# Patient Record
Sex: Female | Born: 1965 | Race: Black or African American | Hispanic: No | Marital: Married | Smoking: Never smoker
Health system: Southern US, Community
[De-identification: ages and names within clinical notes are randomized; demographics above are authoritative.]

## PROBLEM LIST (undated history)

## (undated) DIAGNOSIS — I2510 Atherosclerotic heart disease of native coronary artery without angina pectoris: Secondary | ICD-10-CM

## (undated) DIAGNOSIS — Z86018 Personal history of other benign neoplasm: Secondary | ICD-10-CM

## (undated) DIAGNOSIS — Z9889 Other specified postprocedural states: Secondary | ICD-10-CM

## (undated) DIAGNOSIS — Z87412 Personal history of vulvar dysplasia: Secondary | ICD-10-CM

## (undated) DIAGNOSIS — Z87411 Personal history of vaginal dysplasia: Secondary | ICD-10-CM

## (undated) DIAGNOSIS — E781 Pure hyperglyceridemia: Secondary | ICD-10-CM

## (undated) DIAGNOSIS — I10 Essential (primary) hypertension: Secondary | ICD-10-CM

## (undated) DIAGNOSIS — R112 Nausea with vomiting, unspecified: Secondary | ICD-10-CM

## (undated) DIAGNOSIS — Z973 Presence of spectacles and contact lenses: Secondary | ICD-10-CM

## (undated) DIAGNOSIS — D071 Carcinoma in situ of vulva: Secondary | ICD-10-CM

## (undated) DIAGNOSIS — Z8619 Personal history of other infectious and parasitic diseases: Secondary | ICD-10-CM

## (undated) DIAGNOSIS — Z21 Asymptomatic human immunodeficiency virus [HIV] infection status: Secondary | ICD-10-CM

## (undated) DIAGNOSIS — B20 Human immunodeficiency virus [HIV] disease: Secondary | ICD-10-CM

## (undated) HISTORY — DX: Asymptomatic human immunodeficiency virus (hiv) infection status: Z21

## (undated) HISTORY — DX: Human immunodeficiency virus (HIV) disease: B20

## (undated) HISTORY — PX: ABDOMINAL HYSTERECTOMY: SHX81

## (undated) HISTORY — DX: Essential (primary) hypertension: I10

---

## 1983-11-10 DIAGNOSIS — Z9079 Acquired absence of other genital organ(s): Secondary | ICD-10-CM | POA: Insufficient documentation

## 1998-05-23 ENCOUNTER — Encounter: Admission: RE | Admit: 1998-05-23 | Discharge: 1998-08-21 | Payer: Self-pay | Admitting: Internal Medicine

## 2000-09-13 ENCOUNTER — Other Ambulatory Visit: Admission: RE | Admit: 2000-09-13 | Discharge: 2000-09-13 | Payer: Self-pay | Admitting: *Deleted

## 2000-09-15 DIAGNOSIS — B20 Human immunodeficiency virus [HIV] disease: Secondary | ICD-10-CM

## 2000-09-23 ENCOUNTER — Encounter: Admission: RE | Admit: 2000-09-23 | Discharge: 2000-09-23 | Payer: Self-pay | Admitting: Infectious Diseases

## 2000-09-23 ENCOUNTER — Ambulatory Visit (HOSPITAL_COMMUNITY): Admission: RE | Admit: 2000-09-23 | Discharge: 2000-09-23 | Payer: Self-pay | Admitting: Infectious Diseases

## 2000-09-23 ENCOUNTER — Encounter (INDEPENDENT_AMBULATORY_CARE_PROVIDER_SITE_OTHER): Payer: Self-pay | Admitting: *Deleted

## 2000-09-23 LAB — CONVERTED CEMR LAB: CD4 T Cell Abs: 190

## 2000-10-13 ENCOUNTER — Encounter: Admission: RE | Admit: 2000-10-13 | Discharge: 2000-10-13 | Payer: Self-pay | Admitting: Infectious Diseases

## 2000-10-13 ENCOUNTER — Ambulatory Visit (HOSPITAL_COMMUNITY): Admission: RE | Admit: 2000-10-13 | Discharge: 2000-10-13 | Payer: Self-pay | Admitting: Infectious Diseases

## 2000-10-27 ENCOUNTER — Encounter: Admission: RE | Admit: 2000-10-27 | Discharge: 2000-10-27 | Payer: Self-pay | Admitting: Infectious Diseases

## 2000-11-24 ENCOUNTER — Encounter: Admission: RE | Admit: 2000-11-24 | Discharge: 2000-11-24 | Payer: Self-pay | Admitting: Infectious Diseases

## 2000-12-13 ENCOUNTER — Encounter: Admission: RE | Admit: 2000-12-13 | Discharge: 2000-12-13 | Payer: Self-pay | Admitting: Infectious Diseases

## 2000-12-22 ENCOUNTER — Encounter: Admission: RE | Admit: 2000-12-22 | Discharge: 2000-12-22 | Payer: Self-pay | Admitting: Infectious Diseases

## 2000-12-29 ENCOUNTER — Encounter: Admission: RE | Admit: 2000-12-29 | Discharge: 2000-12-29 | Payer: Self-pay | Admitting: Infectious Diseases

## 2000-12-30 ENCOUNTER — Ambulatory Visit (HOSPITAL_COMMUNITY): Admission: RE | Admit: 2000-12-30 | Discharge: 2000-12-30 | Payer: Self-pay | Admitting: Infectious Diseases

## 2000-12-30 ENCOUNTER — Encounter: Payer: Self-pay | Admitting: Infectious Diseases

## 2001-01-05 ENCOUNTER — Encounter: Admission: RE | Admit: 2001-01-05 | Discharge: 2001-01-05 | Payer: Self-pay | Admitting: Infectious Diseases

## 2001-01-05 ENCOUNTER — Ambulatory Visit (HOSPITAL_COMMUNITY): Admission: RE | Admit: 2001-01-05 | Discharge: 2001-01-05 | Payer: Self-pay | Admitting: Infectious Diseases

## 2001-02-02 ENCOUNTER — Encounter: Admission: RE | Admit: 2001-02-02 | Discharge: 2001-02-02 | Payer: Self-pay | Admitting: Infectious Diseases

## 2001-03-02 ENCOUNTER — Encounter: Admission: RE | Admit: 2001-03-02 | Discharge: 2001-03-02 | Payer: Self-pay | Admitting: Infectious Diseases

## 2001-04-27 ENCOUNTER — Encounter: Admission: RE | Admit: 2001-04-27 | Discharge: 2001-04-27 | Payer: Self-pay | Admitting: Infectious Diseases

## 2001-04-27 ENCOUNTER — Ambulatory Visit (HOSPITAL_COMMUNITY): Admission: RE | Admit: 2001-04-27 | Discharge: 2001-04-27 | Payer: Self-pay | Admitting: Infectious Diseases

## 2001-04-28 ENCOUNTER — Ambulatory Visit (HOSPITAL_BASED_OUTPATIENT_CLINIC_OR_DEPARTMENT_OTHER): Admission: RE | Admit: 2001-04-28 | Discharge: 2001-04-28 | Payer: Self-pay | Admitting: General Surgery

## 2001-04-28 HISTORY — PX: OTHER SURGICAL HISTORY: SHX169

## 2001-05-18 ENCOUNTER — Encounter: Admission: RE | Admit: 2001-05-18 | Discharge: 2001-05-18 | Payer: Self-pay | Admitting: Infectious Diseases

## 2001-06-08 ENCOUNTER — Encounter: Admission: RE | Admit: 2001-06-08 | Discharge: 2001-06-08 | Payer: Self-pay | Admitting: Infectious Diseases

## 2001-07-08 ENCOUNTER — Encounter: Admission: RE | Admit: 2001-07-08 | Discharge: 2001-07-08 | Payer: Self-pay | Admitting: Infectious Diseases

## 2001-09-07 ENCOUNTER — Encounter: Admission: RE | Admit: 2001-09-07 | Discharge: 2001-09-07 | Payer: Self-pay | Admitting: Infectious Diseases

## 2001-09-07 ENCOUNTER — Ambulatory Visit (HOSPITAL_COMMUNITY): Admission: RE | Admit: 2001-09-07 | Discharge: 2001-09-07 | Payer: Self-pay | Admitting: Infectious Diseases

## 2001-11-16 ENCOUNTER — Encounter: Admission: RE | Admit: 2001-11-16 | Discharge: 2001-11-16 | Payer: Self-pay | Admitting: Infectious Diseases

## 2001-11-16 ENCOUNTER — Ambulatory Visit (HOSPITAL_COMMUNITY): Admission: RE | Admit: 2001-11-16 | Discharge: 2001-11-16 | Payer: Self-pay | Admitting: Infectious Diseases

## 2002-03-22 ENCOUNTER — Encounter: Admission: RE | Admit: 2002-03-22 | Discharge: 2002-03-22 | Payer: Self-pay | Admitting: Infectious Diseases

## 2002-04-05 ENCOUNTER — Encounter: Admission: RE | Admit: 2002-04-05 | Discharge: 2002-04-05 | Payer: Self-pay | Admitting: Infectious Diseases

## 2002-04-05 ENCOUNTER — Ambulatory Visit (HOSPITAL_COMMUNITY): Admission: RE | Admit: 2002-04-05 | Discharge: 2002-04-05 | Payer: Self-pay | Admitting: Infectious Diseases

## 2002-10-11 ENCOUNTER — Encounter: Admission: RE | Admit: 2002-10-11 | Discharge: 2002-10-11 | Payer: Self-pay | Admitting: Infectious Diseases

## 2002-10-11 ENCOUNTER — Ambulatory Visit (HOSPITAL_COMMUNITY): Admission: RE | Admit: 2002-10-11 | Discharge: 2002-10-11 | Payer: Self-pay | Admitting: Infectious Diseases

## 2003-10-29 ENCOUNTER — Encounter: Admission: RE | Admit: 2003-10-29 | Discharge: 2003-10-29 | Payer: Self-pay | Admitting: Infectious Diseases

## 2003-10-29 ENCOUNTER — Ambulatory Visit (HOSPITAL_COMMUNITY): Admission: RE | Admit: 2003-10-29 | Discharge: 2003-10-29 | Payer: Self-pay | Admitting: Infectious Diseases

## 2003-10-29 ENCOUNTER — Encounter: Payer: Self-pay | Admitting: Infectious Diseases

## 2003-11-12 ENCOUNTER — Encounter: Admission: RE | Admit: 2003-11-12 | Discharge: 2003-11-12 | Payer: Self-pay | Admitting: Infectious Diseases

## 2004-01-16 ENCOUNTER — Encounter: Admission: RE | Admit: 2004-01-16 | Discharge: 2004-01-16 | Payer: Self-pay | Admitting: Infectious Diseases

## 2004-07-07 ENCOUNTER — Ambulatory Visit (HOSPITAL_COMMUNITY): Admission: RE | Admit: 2004-07-07 | Discharge: 2004-07-07 | Payer: Self-pay | Admitting: Infectious Diseases

## 2004-07-07 ENCOUNTER — Encounter: Admission: RE | Admit: 2004-07-07 | Discharge: 2004-07-07 | Payer: Self-pay | Admitting: Infectious Diseases

## 2004-09-15 ENCOUNTER — Ambulatory Visit: Payer: Self-pay | Admitting: Infectious Diseases

## 2004-09-29 ENCOUNTER — Ambulatory Visit: Payer: Self-pay | Admitting: Infectious Diseases

## 2004-09-29 ENCOUNTER — Ambulatory Visit (HOSPITAL_COMMUNITY): Admission: RE | Admit: 2004-09-29 | Discharge: 2004-09-29 | Payer: Self-pay | Admitting: Infectious Diseases

## 2004-10-29 ENCOUNTER — Ambulatory Visit: Payer: Self-pay | Admitting: Infectious Diseases

## 2004-10-29 ENCOUNTER — Ambulatory Visit (HOSPITAL_COMMUNITY): Admission: RE | Admit: 2004-10-29 | Discharge: 2004-10-29 | Payer: Self-pay | Admitting: Infectious Diseases

## 2005-01-28 ENCOUNTER — Ambulatory Visit: Payer: Self-pay | Admitting: Infectious Diseases

## 2005-01-28 ENCOUNTER — Ambulatory Visit (HOSPITAL_COMMUNITY): Admission: RE | Admit: 2005-01-28 | Discharge: 2005-01-28 | Payer: Self-pay | Admitting: Infectious Diseases

## 2005-03-30 ENCOUNTER — Ambulatory Visit: Payer: Self-pay | Admitting: Infectious Diseases

## 2005-03-30 ENCOUNTER — Ambulatory Visit (HOSPITAL_COMMUNITY): Admission: RE | Admit: 2005-03-30 | Discharge: 2005-03-30 | Payer: Self-pay | Admitting: Infectious Diseases

## 2005-06-24 ENCOUNTER — Ambulatory Visit (HOSPITAL_COMMUNITY): Admission: RE | Admit: 2005-06-24 | Discharge: 2005-06-24 | Payer: Self-pay | Admitting: Infectious Diseases

## 2005-06-24 ENCOUNTER — Ambulatory Visit: Payer: Self-pay | Admitting: Infectious Diseases

## 2005-08-24 ENCOUNTER — Ambulatory Visit (HOSPITAL_COMMUNITY): Admission: RE | Admit: 2005-08-24 | Discharge: 2005-08-24 | Payer: Self-pay | Admitting: Infectious Diseases

## 2005-08-24 ENCOUNTER — Ambulatory Visit: Payer: Self-pay | Admitting: Infectious Diseases

## 2005-10-28 ENCOUNTER — Ambulatory Visit (HOSPITAL_COMMUNITY): Admission: RE | Admit: 2005-10-28 | Discharge: 2005-10-28 | Payer: Self-pay | Admitting: Infectious Diseases

## 2005-10-28 ENCOUNTER — Ambulatory Visit: Payer: Self-pay | Admitting: Infectious Diseases

## 2006-01-16 ENCOUNTER — Emergency Department (HOSPITAL_COMMUNITY): Admission: EM | Admit: 2006-01-16 | Discharge: 2006-01-16 | Payer: Self-pay | Admitting: Family Medicine

## 2006-02-10 ENCOUNTER — Encounter: Admission: RE | Admit: 2006-02-10 | Discharge: 2006-02-10 | Payer: Self-pay | Admitting: Infectious Diseases

## 2006-02-10 ENCOUNTER — Ambulatory Visit: Payer: Self-pay | Admitting: Infectious Diseases

## 2006-03-10 ENCOUNTER — Encounter: Admission: RE | Admit: 2006-03-10 | Discharge: 2006-03-10 | Payer: Self-pay | Admitting: Infectious Diseases

## 2006-03-10 ENCOUNTER — Encounter (INDEPENDENT_AMBULATORY_CARE_PROVIDER_SITE_OTHER): Payer: Self-pay | Admitting: *Deleted

## 2006-03-10 ENCOUNTER — Ambulatory Visit: Payer: Self-pay | Admitting: Infectious Diseases

## 2006-03-30 ENCOUNTER — Ambulatory Visit: Payer: Self-pay | Admitting: Infectious Diseases

## 2006-04-14 ENCOUNTER — Ambulatory Visit: Payer: Self-pay | Admitting: Infectious Diseases

## 2006-06-30 ENCOUNTER — Ambulatory Visit: Payer: Self-pay | Admitting: Infectious Diseases

## 2006-08-18 DIAGNOSIS — L0293 Carbuncle, unspecified: Secondary | ICD-10-CM

## 2006-08-18 DIAGNOSIS — L0292 Furuncle, unspecified: Secondary | ICD-10-CM | POA: Insufficient documentation

## 2006-08-18 DIAGNOSIS — L03317 Cellulitis of buttock: Secondary | ICD-10-CM | POA: Insufficient documentation

## 2006-08-18 DIAGNOSIS — L0231 Cutaneous abscess of buttock: Secondary | ICD-10-CM

## 2006-08-18 DIAGNOSIS — K120 Recurrent oral aphthae: Secondary | ICD-10-CM | POA: Insufficient documentation

## 2006-08-30 ENCOUNTER — Encounter (INDEPENDENT_AMBULATORY_CARE_PROVIDER_SITE_OTHER): Payer: Self-pay | Admitting: *Deleted

## 2006-08-30 ENCOUNTER — Ambulatory Visit: Payer: Self-pay | Admitting: Infectious Diseases

## 2006-08-30 ENCOUNTER — Encounter: Admission: RE | Admit: 2006-08-30 | Discharge: 2006-08-30 | Payer: Self-pay | Admitting: Infectious Diseases

## 2006-08-30 LAB — CONVERTED CEMR LAB
AST: 23 units/L (ref 0–37)
Alkaline Phosphatase: 48 units/L (ref 39–117)
BUN: 6 mg/dL (ref 6–23)
Calcium: 9.3 mg/dL (ref 8.4–10.5)
Creatinine, Ser: 0.72 mg/dL (ref 0.40–1.20)
Glucose, Bld: 85 mg/dL (ref 70–99)
HCT: 40.8 % (ref 36.0–46.0)
HIV-1 RNA Quant, Log: 3.08 — ABNORMAL HIGH (ref <1.70)
Hemoglobin: 13.3 g/dL (ref 12.0–15.0)
MCHC: 32.6 g/dL (ref 30.0–36.0)
RDW: 12 % (ref 11.5–14.0)

## 2006-10-13 ENCOUNTER — Encounter: Payer: Self-pay | Admitting: Infectious Diseases

## 2006-10-26 ENCOUNTER — Ambulatory Visit: Payer: Self-pay | Admitting: Internal Medicine

## 2006-11-24 ENCOUNTER — Encounter: Admission: RE | Admit: 2006-11-24 | Discharge: 2006-11-24 | Payer: Self-pay | Admitting: Infectious Diseases

## 2006-11-24 ENCOUNTER — Encounter (INDEPENDENT_AMBULATORY_CARE_PROVIDER_SITE_OTHER): Payer: Self-pay | Admitting: *Deleted

## 2006-11-24 ENCOUNTER — Ambulatory Visit: Payer: Self-pay | Admitting: Infectious Diseases

## 2006-11-24 DIAGNOSIS — Z91199 Patient's noncompliance with other medical treatment and regimen due to unspecified reason: Secondary | ICD-10-CM | POA: Insufficient documentation

## 2006-11-24 DIAGNOSIS — Z9119 Patient's noncompliance with other medical treatment and regimen: Secondary | ICD-10-CM

## 2006-11-24 LAB — CONVERTED CEMR LAB
AST: 18 units/L (ref 0–37)
Albumin: 4.2 g/dL (ref 3.5–5.2)
BUN: 4 mg/dL — ABNORMAL LOW (ref 6–23)
Basophils Relative: 1 % (ref 0–1)
CD4 Count: 80 microliters
CO2: 26 meq/L (ref 19–32)
Calcium: 9.2 mg/dL (ref 8.4–10.5)
Chloride: 105 meq/L (ref 96–112)
Cholesterol: 111 mg/dL (ref 0–200)
Creatinine, Ser: 0.68 mg/dL (ref 0.40–1.20)
Glucose, Bld: 77 mg/dL (ref 70–99)
HCT: 36.3 % (ref 36.0–46.0)
HDL: 35 mg/dL — ABNORMAL LOW (ref >39)
HIV 1 RNA Quant: 147 copies/mL
Lymphocytes Relative: 51 % — ABNORMAL HIGH (ref 12–46)
Lymphs Abs: 1.9 10*3/uL (ref 0.7–3.3)
MCV: 92.8 fL (ref 78.0–100.0)
Monocytes Relative: 11 % (ref 3–11)
Neutrophils Relative %: 38 % — ABNORMAL LOW (ref 43–77)
RBC: 3.91 M/uL (ref 3.87–5.11)
Total CHOL/HDL Ratio: 3.2
Triglycerides: 110 mg/dL (ref <150)
WBC: 3.8 10*3/uL — ABNORMAL LOW (ref 4.0–10.5)

## 2007-01-03 ENCOUNTER — Encounter (INDEPENDENT_AMBULATORY_CARE_PROVIDER_SITE_OTHER): Payer: Self-pay | Admitting: *Deleted

## 2007-01-03 LAB — CONVERTED CEMR LAB

## 2007-01-16 ENCOUNTER — Encounter (INDEPENDENT_AMBULATORY_CARE_PROVIDER_SITE_OTHER): Payer: Self-pay | Admitting: *Deleted

## 2007-02-23 ENCOUNTER — Ambulatory Visit: Payer: Self-pay | Admitting: Infectious Diseases

## 2007-02-23 ENCOUNTER — Encounter: Admission: RE | Admit: 2007-02-23 | Discharge: 2007-02-23 | Payer: Self-pay | Admitting: Infectious Diseases

## 2007-02-23 LAB — CONVERTED CEMR LAB
CD4 Count: 100 microliters
HIV 1 RNA Quant: 378 copies/mL — ABNORMAL HIGH (ref <50)
HIV-1 RNA Quant, Log: 2.58 — ABNORMAL HIGH (ref <1.70)

## 2007-03-18 ENCOUNTER — Ambulatory Visit: Payer: Self-pay | Admitting: Internal Medicine

## 2007-03-18 DIAGNOSIS — J019 Acute sinusitis, unspecified: Secondary | ICD-10-CM

## 2007-04-05 ENCOUNTER — Ambulatory Visit: Payer: Self-pay | Admitting: Infectious Diseases

## 2007-08-03 ENCOUNTER — Encounter (INDEPENDENT_AMBULATORY_CARE_PROVIDER_SITE_OTHER): Payer: Self-pay | Admitting: *Deleted

## 2007-08-03 ENCOUNTER — Encounter: Admission: RE | Admit: 2007-08-03 | Discharge: 2007-08-03 | Payer: Self-pay | Admitting: Infectious Diseases

## 2007-08-03 ENCOUNTER — Ambulatory Visit: Payer: Self-pay | Admitting: Infectious Diseases

## 2007-08-03 LAB — CONVERTED CEMR LAB
ALT: 14 units/L (ref 0–35)
AST: 20 units/L (ref 0–37)
CO2: 24 meq/L (ref 19–32)
Calcium: 8.9 mg/dL (ref 8.4–10.5)
Chloride: 103 meq/L (ref 96–112)
Creatinine, Ser: 0.76 mg/dL (ref 0.40–1.20)
HCT: 39.1 % (ref 36.0–46.0)
Platelets: 211 10*3/uL (ref 150–400)
RDW: 14.7 % — ABNORMAL HIGH (ref 11.5–14.0)
Sodium: 140 meq/L (ref 135–145)
Total Protein: 8.8 g/dL — ABNORMAL HIGH (ref 6.0–8.3)
WBC: 4.4 10*3/uL (ref 4.0–10.5)

## 2007-08-25 ENCOUNTER — Telehealth: Payer: Self-pay | Admitting: Infectious Diseases

## 2007-11-23 ENCOUNTER — Ambulatory Visit: Payer: Self-pay | Admitting: Infectious Diseases

## 2007-11-23 ENCOUNTER — Encounter: Admission: RE | Admit: 2007-11-23 | Discharge: 2007-11-23 | Payer: Self-pay | Admitting: Infectious Diseases

## 2007-11-23 LAB — CONVERTED CEMR LAB
AST: 22 units/L (ref 0–37)
Alkaline Phosphatase: 55 units/L (ref 39–117)
BUN: 8 mg/dL (ref 6–23)
Glucose, Bld: 99 mg/dL (ref 70–99)
HDL: 42 mg/dL (ref >39)
Hemoglobin: 13.2 g/dL (ref 12.0–15.0)
LDL Cholesterol: 65 mg/dL (ref 0–99)
MCHC: 32.8 g/dL (ref 30.0–36.0)
MCV: 98.5 fL (ref 78.0–100.0)
RBC: 4.09 M/uL (ref 3.87–5.11)
RDW: 14.2 % (ref 11.5–15.5)
Sodium: 139 meq/L (ref 135–145)
Total Bilirubin: 0.3 mg/dL (ref 0.3–1.2)
Total CHOL/HDL Ratio: 3.1
Triglycerides: 117 mg/dL (ref <150)
VLDL: 23 mg/dL (ref 0–40)

## 2008-03-20 ENCOUNTER — Ambulatory Visit: Payer: Self-pay | Admitting: Infectious Diseases

## 2008-03-20 ENCOUNTER — Encounter: Admission: RE | Admit: 2008-03-20 | Discharge: 2008-03-20 | Payer: Self-pay | Admitting: Infectious Diseases

## 2008-03-20 LAB — CONVERTED CEMR LAB
Alkaline Phosphatase: 49 units/L (ref 39–117)
Basophils Absolute: 0 10*3/uL (ref 0.0–0.1)
Basophils Relative: 0 % (ref 0–1)
Eosinophils Absolute: 0 10*3/uL (ref 0.0–0.7)
Eosinophils Relative: 0 % (ref 0–5)
Glucose, Bld: 94 mg/dL (ref 70–99)
HCT: 38.4 % (ref 36.0–46.0)
HIV-1 RNA Quant, Log: 2.99 — ABNORMAL HIGH (ref <1.70)
Lymphs Abs: 1.7 10*3/uL (ref 0.7–4.0)
MCHC: 33.3 g/dL (ref 30.0–36.0)
MCV: 99.2 fL (ref 78.0–100.0)
Platelets: 199 10*3/uL (ref 150–400)
RDW: 14.6 % (ref 11.5–15.5)
Sodium: 140 meq/L (ref 135–145)
Total Bilirubin: 0.5 mg/dL (ref 0.3–1.2)
Total Protein: 8.7 g/dL — ABNORMAL HIGH (ref 6.0–8.3)

## 2008-05-02 ENCOUNTER — Ambulatory Visit: Payer: Self-pay | Admitting: Infectious Diseases

## 2008-08-02 ENCOUNTER — Ambulatory Visit: Payer: Self-pay | Admitting: Infectious Diseases

## 2008-08-02 LAB — CONVERTED CEMR LAB: HIV 1 RNA Quant: 5560 copies/mL — ABNORMAL HIGH (ref <50)

## 2008-08-03 ENCOUNTER — Encounter: Payer: Self-pay | Admitting: Infectious Diseases

## 2008-08-03 LAB — CONVERTED CEMR LAB
BUN: 6 mg/dL (ref 6–23)
Basophils Absolute: 0 10*3/uL (ref 0.0–0.1)
CO2: 24 meq/L (ref 19–32)
Creatinine, Ser: 0.66 mg/dL (ref 0.40–1.20)
Eosinophils Relative: 0 % (ref 0–5)
Glucose, Bld: 87 mg/dL (ref 70–99)
HCT: 39 % (ref 36.0–46.0)
Hemoglobin: 13 g/dL (ref 12.0–15.0)
Lymphocytes Relative: 48 % — ABNORMAL HIGH (ref 12–46)
Lymphs Abs: 1.8 10*3/uL (ref 0.7–4.0)
Monocytes Absolute: 0.5 10*3/uL (ref 0.1–1.0)
RDW: 13 % (ref 11.5–15.5)
Total Bilirubin: 0.7 mg/dL (ref 0.3–1.2)

## 2008-09-24 ENCOUNTER — Ambulatory Visit: Payer: Self-pay | Admitting: Infectious Diseases

## 2008-09-24 DIAGNOSIS — B370 Candidal stomatitis: Secondary | ICD-10-CM | POA: Insufficient documentation

## 2008-10-15 ENCOUNTER — Telehealth (INDEPENDENT_AMBULATORY_CARE_PROVIDER_SITE_OTHER): Payer: Self-pay | Admitting: *Deleted

## 2008-10-18 ENCOUNTER — Encounter: Payer: Self-pay | Admitting: Infectious Diseases

## 2008-10-18 ENCOUNTER — Ambulatory Visit: Payer: Self-pay | Admitting: Infectious Diseases

## 2008-10-18 LAB — CONVERTED CEMR LAB
AST: 20 units/L (ref 0–37)
Albumin: 4.1 g/dL (ref 3.5–5.2)
BUN: 8 mg/dL (ref 6–23)
Basophils Relative: 0 % (ref 0–1)
Calcium: 9.1 mg/dL (ref 8.4–10.5)
Chloride: 106 meq/L (ref 96–112)
Glucose, Bld: 90 mg/dL (ref 70–99)
Lymphs Abs: 1.8 10*3/uL (ref 0.7–4.0)
Monocytes Relative: 12 % (ref 3–12)
Neutro Abs: 1.1 10*3/uL — ABNORMAL LOW (ref 1.7–7.7)
Neutrophils Relative %: 34 % — ABNORMAL LOW (ref 43–77)
Potassium: 3.5 meq/L (ref 3.5–5.3)
RBC: 3.92 M/uL (ref 3.87–5.11)
Sodium: 142 meq/L (ref 135–145)
Total Protein: 8.1 g/dL (ref 6.0–8.3)
WBC: 3.4 10*3/uL — ABNORMAL LOW (ref 4.0–10.5)

## 2008-11-12 ENCOUNTER — Ambulatory Visit: Payer: Self-pay | Admitting: Infectious Diseases

## 2008-12-05 ENCOUNTER — Telehealth: Payer: Self-pay

## 2009-02-27 ENCOUNTER — Ambulatory Visit: Payer: Self-pay | Admitting: Infectious Diseases

## 2009-02-27 LAB — CONVERTED CEMR LAB
Alkaline Phosphatase: 57 units/L (ref 39–117)
Cholesterol: 113 mg/dL (ref 0–200)
Creatinine, Ser: 0.75 mg/dL (ref 0.40–1.20)
Eosinophils Relative: 0 % (ref 0–5)
GFR calc non Af Amer: >60 mL/min (ref >60)
Glucose, Bld: 94 mg/dL (ref 70–99)
HCT: 37.5 % (ref 36.0–46.0)
HDL: 31 mg/dL — ABNORMAL LOW (ref >39)
Hemoglobin: 12.1 g/dL (ref 12.0–15.0)
LDL Cholesterol: 55 mg/dL (ref 0–99)
Lymphocytes Relative: 40 % (ref 12–46)
Lymphs Abs: 1.2 10*3/uL (ref 0.7–4.0)
Monocytes Absolute: 0.3 10*3/uL (ref 0.1–1.0)
Monocytes Relative: 11 % (ref 3–12)
Neutro Abs: 1.4 10*3/uL — ABNORMAL LOW (ref 1.7–7.7)
RBC: 4.12 M/uL (ref 3.87–5.11)
RDW: 13.3 % (ref 11.5–15.5)
Sodium: 139 meq/L (ref 135–145)
Total Bilirubin: 0.3 mg/dL (ref 0.3–1.2)
Total CHOL/HDL Ratio: 3.6
Total Protein: 8.3 g/dL (ref 6.0–8.3)
Triglycerides: 134 mg/dL (ref <150)
VLDL: 27 mg/dL (ref 0–40)
WBC: 3 10*3/uL — ABNORMAL LOW (ref 4.0–10.5)

## 2009-03-13 ENCOUNTER — Ambulatory Visit: Payer: Self-pay | Admitting: Infectious Diseases

## 2009-05-20 ENCOUNTER — Telehealth (INDEPENDENT_AMBULATORY_CARE_PROVIDER_SITE_OTHER): Payer: Self-pay | Admitting: *Deleted

## 2009-06-12 ENCOUNTER — Ambulatory Visit: Payer: Self-pay | Admitting: Infectious Diseases

## 2009-06-12 DIAGNOSIS — I10 Essential (primary) hypertension: Secondary | ICD-10-CM

## 2009-06-12 LAB — CONVERTED CEMR LAB
BUN: 6 mg/dL (ref 6–23)
Chloride: 107 meq/L (ref 96–112)
Glucose, Bld: 92 mg/dL (ref 70–99)
Potassium: 3.7 meq/L (ref 3.5–5.3)
Sodium: 142 meq/L (ref 135–145)

## 2009-08-07 ENCOUNTER — Ambulatory Visit: Payer: Self-pay | Admitting: Infectious Diseases

## 2009-08-07 LAB — CONVERTED CEMR LAB
BUN: 10 mg/dL (ref 6–23)
Basophils Relative: 0 % (ref 0–1)
CO2: 23 meq/L (ref 19–32)
Calcium: 8.7 mg/dL (ref 8.4–10.5)
Chloride: 105 meq/L (ref 96–112)
Creatinine, Ser: 0.77 mg/dL (ref 0.40–1.20)
Eosinophils Absolute: 0 10*3/uL (ref 0.0–0.7)
Eosinophils Relative: 0 % (ref 0–5)
HCT: 35.2 % — ABNORMAL LOW (ref 36.0–46.0)
Lymphs Abs: 1.3 10*3/uL (ref 0.7–4.0)
MCHC: 32.4 g/dL (ref 30.0–36.0)
MCV: 89.6 fL (ref >=78.0)
Monocytes Relative: 15 % — ABNORMAL HIGH (ref 3–12)
Neutrophils Relative %: 38 % — ABNORMAL LOW (ref 43–77)
Platelets: 224 10*3/uL (ref 150–400)
RBC: 3.93 M/uL (ref 3.87–5.11)
Total Bilirubin: 0.4 mg/dL (ref 0.3–1.2)
WBC: 2.8 10*3/uL — ABNORMAL LOW (ref 4.0–10.5)

## 2009-08-12 ENCOUNTER — Encounter: Payer: Self-pay | Admitting: Infectious Diseases

## 2009-08-12 LAB — CONVERTED CEMR LAB
HIV 1 RNA Quant: 1020 copies/mL — ABNORMAL HIGH (ref <48)
HIV-1 RNA Quant, Log: 3.01 — ABNORMAL HIGH (ref <1.68)

## 2009-08-15 ENCOUNTER — Telehealth: Payer: Self-pay | Admitting: Infectious Diseases

## 2009-09-12 ENCOUNTER — Ambulatory Visit (HOSPITAL_COMMUNITY): Admission: RE | Admit: 2009-09-12 | Discharge: 2009-09-12 | Payer: Self-pay | Admitting: Infectious Diseases

## 2009-09-25 ENCOUNTER — Telehealth: Payer: Self-pay

## 2009-10-01 ENCOUNTER — Ambulatory Visit: Payer: Self-pay | Admitting: Infectious Diseases

## 2009-10-01 LAB — CONVERTED CEMR LAB
Calcium: 8.7 mg/dL (ref 8.4–10.5)
HIV 1 RNA Quant: 6870 copies/mL — ABNORMAL HIGH (ref <48)
HIV-1 RNA Quant, Log: 3.84 — ABNORMAL HIGH (ref <1.68)
Potassium: 3.1 meq/L — ABNORMAL LOW (ref 3.5–5.3)
Sodium: 142 meq/L (ref 135–145)

## 2009-10-14 ENCOUNTER — Telehealth: Payer: Self-pay | Admitting: Infectious Diseases

## 2009-11-11 ENCOUNTER — Telehealth: Payer: Self-pay

## 2009-11-14 ENCOUNTER — Ambulatory Visit: Payer: Self-pay | Admitting: Infectious Diseases

## 2009-11-14 LAB — CONVERTED CEMR LAB
AST: 30 units/L (ref 0–37)
Alkaline Phosphatase: 68 units/L (ref 39–117)
BUN: 14 mg/dL (ref 6–23)
Basophils Absolute: 0 10*3/uL (ref 0.0–0.1)
Creatinine, Ser: 0.95 mg/dL (ref 0.40–1.20)
Eosinophils Relative: 0 % (ref 0–5)
HCT: 32.5 % — ABNORMAL LOW (ref 36.0–46.0)
HDL: 32 mg/dL — ABNORMAL LOW (ref >39)
Lymphocytes Relative: 50 % — ABNORMAL HIGH (ref 12–46)
Platelets: 256 10*3/uL (ref 150–400)
Potassium: 3.6 meq/L (ref 3.5–5.3)
RDW: 12.9 % (ref 11.5–15.5)
Total Bilirubin: 0.3 mg/dL (ref 0.3–1.2)
Total CHOL/HDL Ratio: 4.7

## 2009-11-18 ENCOUNTER — Telehealth: Payer: Self-pay | Admitting: Infectious Diseases

## 2009-11-28 ENCOUNTER — Ambulatory Visit (HOSPITAL_COMMUNITY): Admission: RE | Admit: 2009-11-28 | Discharge: 2009-11-28 | Payer: Self-pay | Admitting: Infectious Diseases

## 2009-11-29 ENCOUNTER — Ambulatory Visit: Payer: Self-pay | Admitting: Internal Medicine

## 2009-11-29 ENCOUNTER — Inpatient Hospital Stay (HOSPITAL_COMMUNITY): Admission: AD | Admit: 2009-11-29 | Discharge: 2009-12-02 | Payer: Self-pay | Admitting: Internal Medicine

## 2009-11-29 ENCOUNTER — Encounter (INDEPENDENT_AMBULATORY_CARE_PROVIDER_SITE_OTHER): Payer: Self-pay | Admitting: Internal Medicine

## 2009-11-29 ENCOUNTER — Telehealth: Payer: Self-pay | Admitting: Infectious Diseases

## 2009-11-30 HISTORY — PX: ANTERIOR CERVICAL DECOMP/DISCECTOMY FUSION: SHX1161

## 2009-12-02 ENCOUNTER — Encounter (INDEPENDENT_AMBULATORY_CARE_PROVIDER_SITE_OTHER): Payer: Self-pay | Admitting: Internal Medicine

## 2009-12-02 DIAGNOSIS — G959 Disease of spinal cord, unspecified: Secondary | ICD-10-CM | POA: Insufficient documentation

## 2009-12-11 ENCOUNTER — Ambulatory Visit: Payer: Self-pay | Admitting: Infectious Diseases

## 2010-03-12 ENCOUNTER — Ambulatory Visit: Payer: Self-pay | Admitting: Infectious Diseases

## 2010-03-12 LAB — CONVERTED CEMR LAB
ALT: 29 units/L (ref 0–35)
AST: 64 units/L — ABNORMAL HIGH (ref 0–37)
BUN: 8 mg/dL (ref 6–23)
Basophils Relative: 1 % (ref 0–1)
CO2: 23 meq/L (ref 19–32)
Creatinine, Ser: 0.65 mg/dL (ref 0.40–1.20)
Eosinophils Absolute: 0 10*3/uL (ref 0.0–0.7)
Eosinophils Relative: 0 % (ref 0–5)
HIV 1 RNA Quant: 8280 copies/mL — ABNORMAL HIGH (ref <48)
MCHC: 32.6 g/dL (ref 30.0–36.0)
MCV: 88.6 fL (ref 78.0–100.0)
Neutrophils Relative %: 41 % — ABNORMAL LOW (ref 43–77)
Platelets: 207 10*3/uL (ref 150–400)
Total Bilirubin: 0.9 mg/dL (ref 0.3–1.2)

## 2010-03-26 ENCOUNTER — Ambulatory Visit: Payer: Self-pay | Admitting: Infectious Diseases

## 2010-03-26 LAB — CONVERTED CEMR LAB

## 2010-03-27 ENCOUNTER — Encounter: Payer: Self-pay | Admitting: Infectious Diseases

## 2010-04-11 ENCOUNTER — Ambulatory Visit: Payer: Self-pay | Admitting: Infectious Diseases

## 2010-04-18 DIAGNOSIS — R87610 Atypical squamous cells of undetermined significance on cytologic smear of cervix (ASC-US): Secondary | ICD-10-CM

## 2010-04-23 ENCOUNTER — Ambulatory Visit: Payer: Self-pay | Admitting: Infectious Diseases

## 2010-05-21 ENCOUNTER — Ambulatory Visit: Payer: Self-pay | Admitting: Obstetrics and Gynecology

## 2010-06-12 ENCOUNTER — Encounter: Payer: Self-pay | Admitting: Infectious Diseases

## 2010-07-07 ENCOUNTER — Encounter: Admission: RE | Admit: 2010-07-07 | Discharge: 2010-08-18 | Payer: Self-pay | Admitting: Neurosurgery

## 2010-09-11 ENCOUNTER — Ambulatory Visit: Payer: Self-pay | Admitting: Infectious Diseases

## 2010-09-11 LAB — CONVERTED CEMR LAB
AST: 23 units/L (ref 0–37)
Alkaline Phosphatase: 70 units/L (ref 39–117)
BUN: 8 mg/dL (ref 6–23)
Basophils Absolute: 0 10*3/uL (ref 0.0–0.1)
Creatinine, Ser: 0.72 mg/dL (ref 0.40–1.20)
Eosinophils Relative: 1 % (ref 0–5)
HCT: 35 % — ABNORMAL LOW (ref 36.0–46.0)
Lymphocytes Relative: 45 % (ref 12–46)
Lymphs Abs: 1.2 10*3/uL (ref 0.7–4.0)
Neutro Abs: 1.1 10*3/uL — ABNORMAL LOW (ref 1.7–7.7)
Platelets: 242 10*3/uL (ref 150–400)
RDW: 13 % (ref 11.5–15.5)
Sodium: 139 meq/L (ref 135–145)
WBC: 2.8 10*3/uL — ABNORMAL LOW (ref 4.0–10.5)

## 2010-09-15 ENCOUNTER — Encounter: Payer: Self-pay | Admitting: Infectious Diseases

## 2010-09-17 ENCOUNTER — Encounter (INDEPENDENT_AMBULATORY_CARE_PROVIDER_SITE_OTHER): Payer: Self-pay | Admitting: *Deleted

## 2010-09-18 ENCOUNTER — Encounter (INDEPENDENT_AMBULATORY_CARE_PROVIDER_SITE_OTHER): Payer: Self-pay | Admitting: *Deleted

## 2010-09-23 ENCOUNTER — Encounter (INDEPENDENT_AMBULATORY_CARE_PROVIDER_SITE_OTHER): Payer: Self-pay | Admitting: *Deleted

## 2010-09-24 ENCOUNTER — Encounter: Payer: Self-pay | Admitting: Infectious Diseases

## 2010-09-25 ENCOUNTER — Encounter (INDEPENDENT_AMBULATORY_CARE_PROVIDER_SITE_OTHER): Payer: Self-pay | Admitting: Licensed Clinical Social Worker

## 2010-09-25 ENCOUNTER — Encounter: Payer: Self-pay | Admitting: Infectious Diseases

## 2010-09-26 ENCOUNTER — Ambulatory Visit (HOSPITAL_COMMUNITY)
Admission: RE | Admit: 2010-09-26 | Discharge: 2010-09-26 | Payer: Self-pay | Source: Home / Self Care | Admitting: Infectious Diseases

## 2010-10-01 ENCOUNTER — Encounter (INDEPENDENT_AMBULATORY_CARE_PROVIDER_SITE_OTHER): Payer: Self-pay | Admitting: *Deleted

## 2010-10-08 ENCOUNTER — Ambulatory Visit: Payer: Self-pay | Admitting: Obstetrics & Gynecology

## 2010-10-08 ENCOUNTER — Other Ambulatory Visit
Admission: RE | Admit: 2010-10-08 | Discharge: 2010-10-08 | Payer: Self-pay | Source: Home / Self Care | Admitting: Obstetrics & Gynecology

## 2010-10-10 ENCOUNTER — Encounter (INDEPENDENT_AMBULATORY_CARE_PROVIDER_SITE_OTHER): Payer: Self-pay | Admitting: *Deleted

## 2010-10-17 ENCOUNTER — Encounter (INDEPENDENT_AMBULATORY_CARE_PROVIDER_SITE_OTHER): Payer: Self-pay | Admitting: *Deleted

## 2010-10-22 ENCOUNTER — Ambulatory Visit: Payer: Self-pay | Admitting: Obstetrics and Gynecology

## 2010-10-28 ENCOUNTER — Encounter (INDEPENDENT_AMBULATORY_CARE_PROVIDER_SITE_OTHER): Payer: Self-pay | Admitting: *Deleted

## 2010-11-14 ENCOUNTER — Encounter (INDEPENDENT_AMBULATORY_CARE_PROVIDER_SITE_OTHER): Payer: Self-pay | Admitting: *Deleted

## 2010-11-30 ENCOUNTER — Encounter: Payer: Self-pay | Admitting: Infectious Diseases

## 2010-12-07 LAB — CONVERTED CEMR LAB: Pap Smear: UNDETERMINED

## 2010-12-11 NOTE — Progress Notes (Signed)
Summary: finger numbness   Phone Note Call from Patient   Summary of Call: L hand , thumb and 1st digit numbness X 2 weeks.  No hand numbness present.   Pt  tried rubbing alcohol and a teaspoon of vinegar..   with no relief.  Pt was given appt for this week for eval. Tomasita Morrow RN  November 11, 2009 9:56 AM   Initial call taken by: Tomasita Morrow RN,  November 11, 2009 9:52 AM

## 2010-12-11 NOTE — Letter (Signed)
Summary: Results Follow-up Letter  [facility]  [address]   [place], [place] [address]   Phone: [phone]  Fax: [phone]         September 15, 2010  [address] [place], [place]  [address]  Dear Ms. CALDWELL,   The following are the results of your recent test(s):  Test     Result     Pap Smear    Normal_______  Not Normal_XXX_       Comments:  Dr. Ninetta Lights has referred you to the [facility] [place] [facility] at [address], tel.# [phone].  Your appointment is:    Date:     October 08, 2010   Time:     1:00 PM  If you need to change this appointment please call [phone] to reschedule.  Thank you.  Sincerely,    Jennet Maduro [facility]

## 2010-12-11 NOTE — Miscellaneous (Signed)
Summary: Bridge Counselor  Clinical Lists Changes BC made another home visit today to check pt's pillbox. Pt still had medications in her Sunday and Wednesday AM slots. Pt reports that she was late for church on Sunday and on Wednesday she "just forgot." Pt also had medication still in her Monday and Thursday PM slots reporting that she fell asleep before taking them. BC reminded pt of the importance of taking ALL of her medications EVERY DAY for them to work properly. Pt expressed understanding and BC challeged pt to not miss any doses the coming week. BC scheduled pt for another home visit Friday December 16th to check her pillbox and watch her fill it again.

## 2010-12-11 NOTE — Miscellaneous (Signed)
  Clinical Lists Changes  Observations: Added new observation of YEARAIDSPOS: 2001  (09/23/2010 15:56)

## 2010-12-11 NOTE — Assessment & Plan Note (Signed)
Summary: PAP SMEAR visit   Vital Signs:  Patient profile:   45 year old female Menstrual status:  hysterectomy due to fibroids, 1985  Vitals Entered By: Jennet Maduro RN (April 11, 2010 3:27 PM) CC: PAP smear visit.  Hysterectomy for fibroid tumors in 1985.  Pt. given educational materials re:  HIV and women, BSE, diet, exercise, nutrition, relationships and OIs.  Pt. given condoms Is Patient Diabetic? No  Does patient need assistance? Functional Status Self care Ambulation Normal     Menstrual Status hysterectomy due to fibroids, 1985 Last PAP Result NEGATIVE FOR INTRAEPITHELIAL LESIONS OR MALIGNANCY.  Prior Medications: REYATAZ 300 MG CAPS (ATAZANAVIR SULFATE) Take 1 tablet by mouth once a day NORVIR 100 MG CAPS (RITONAVIR) Take 1 tablet by mouth once a day TRUVADA 200-300 MG TABS (EMTRICITABINE-TENOFOVIR) Take 1 tablet by mouth once a day ISENTRESS 400 MG TABS (RALTEGRAVIR POTASSIUM) 1 tab two times a day BACTRIM DS 800-160 MG TABS (SULFAMETHOXAZOLE-TRIMETHOPRIM) 1 tab po mwf HYDROCHLOROTHIAZIDE 25 MG TABS (HYDROCHLOROTHIAZIDE) Take one (1) by mouth daily Current Allergies: No known allergies  Orders Added: 1)  T-PAP [facility]) [88142] 2)  Est. Patient Level I [40981]  Appended Document: PAP SMEAR visit / referral to [facility] for ASCUS

## 2010-12-11 NOTE — Miscellaneous (Signed)
Summary: Bridge Counselor  Clinical Lists Changes BC made a home visit today to provide Tx adherence. Pt was given a list of her medications including pictures of each medication, when to take them and how to take them. BC provided pt with a pillbox and assisted pt with filling it up. BC also provided a journal to keep track of questions and side effects, a lab tracker and condoms. BC made a referral to [facility] for better access to and compliance with her medications. BC will make weekly home visits with pt to make sure that she is taking all of her medications correctly. Pt has obtained her prezista and bactrim.

## 2010-12-11 NOTE — Progress Notes (Signed)
Summary: Direct admission needed for today   Phone Note Other Incoming   Caller: Dr Ninetta Lights  Summary of Call: pt has abnormal xray and will need admission.  She should be a direct  admit. We will call Admissions for bed Diagnosis: cord compression  Attempt to reach pt via: left message on machine to call the office it is very important.  Tomasita Morrow RN  November 29, 2009 11:13 AM   Follow-up for Phone Call        second attempt: 11:32 pm no answer  Tomasita Morrow RN  November 29, 2009 11:33 AM Pt was reached and told this is very important that she is admitted to the hospital today. She has many questions . I stressed the importance  of her coming fo the hospital for admission. I think she understands and will  come when she is called.    Follow-up by: Tomasita Morrow RN,  December 02, 2009 10:38 AM

## 2010-12-11 NOTE — Assessment & Plan Note (Signed)
Summary: 3 MONTH F/U VS   CC:  follow-up visit.  History of Present Illness: 60 yoF with hx of HIV+ on ISS/ATVr/TRV. Last CD4 80 and VL 1060 (Jan 2011).  Last Genotype naive.  Prev abn Genotypes (see PMHx).  at prev visit was complained of numbness in her arm and tingling. she was sent for MRI and had significant cord compression. On November 30, 2009 she underwent  anterior cervical decompression, C4-5, C5-6.  Arthrodesis C4-C5, 7-mm structural allograft and C5-C6, 6-mm structural allograft.  Anterior instrumentation 30-mm Synthes Vectra plate with 14 mm screws. Was d/c home on 12-02-09.   CD4 90 an VL 8280, H/H 11.7/35.9, AST 64 (03-12-10).  States she been taking her meds and not missing any doses. feeling well, no complaints despite multiple asks.   Preventive Screening-Counseling & Management  Alcohol-Tobacco     Alcohol drinks/day: 0     Smoking Status: never  Caffeine-Diet-Exercise     Caffeine use/day: 0     Does Patient Exercise: yes     Type of exercise: walking when weather permits     Exercise (avg: min/session): <30     Times/week: <3  Hep-HIV-STD-Contraception     HIV Risk: risk noted     HIV Risk Counseling: not indicated-no HIV risk noted  Safety-Violence-Falls     Seat Belt Use: yes  Comments: declined condoms      Sexual History:  n/a.        Drug Use:  never.    Current Medications (verified): 1)  Reyataz 300 Mg Caps (Atazanavir Sulfate) .... Take 1 Tablet By Mouth Once A Day 2)  Norvir 100 Mg Caps (Ritonavir) .... Take 1 Tablet By Mouth Once A Day 3)  Truvada 200-300 Mg Tabs (Emtricitabine-Tenofovir) .... Take 1 Tablet By Mouth Once A Day 4)  Isentress 400 Mg Tabs (Raltegravir Potassium) .Marland Kitchen.. 1 Tab Two Times A Day 5)  Bactrim Ds 800-160 Mg Tabs (Sulfamethoxazole-Trimethoprim) .Marland Kitchen.. 1 Tab Po Mwf 6)  Hydrochlorothiazide 25 Mg Tabs (Hydrochlorothiazide) .... Take One (1) By Mouth Daily  Allergies (verified): No Known Drug Allergies    Updated Prior  Medication List: REYATAZ 300 MG CAPS (ATAZANAVIR SULFATE) Take 1 tablet by mouth once a day NORVIR 100 MG CAPS (RITONAVIR) Take 1 tablet by mouth once a day TRUVADA 200-300 MG TABS (EMTRICITABINE-TENOFOVIR) Take 1 tablet by mouth once a day ISENTRESS 400 MG TABS (RALTEGRAVIR POTASSIUM) 1 tab two times a day BACTRIM DS 800-160 MG TABS (SULFAMETHOXAZOLE-TRIMETHOPRIM) 1 tab po mwf HYDROCHLOROTHIAZIDE 25 MG TABS (HYDROCHLOROTHIAZIDE) Take one (1) by mouth daily  Current Allergies (reviewed today): No known allergies  Social History: Sexual History:  n/a  Review of Systems       she is unable to list her medications by name.   Vital Signs:  Patient profile:   45 year old female Menstrual status:  postmenopausal Height:      62 inches (157.48 cm) Weight:      136.5 pounds (62.05 kg) BMI:     25.06 Temp:     98.3 degrees F (36.83 degrees C) oral Pulse rate:   97 / minute BP sitting:   146 / 99  (left arm) Cuff size:   98.3  Vitals Entered By: Jennet Maduro RN (Mar 26, 2010 8:57 AM) CC: follow-up visit Is Patient Diabetic? No Pain Assessment Patient in pain? no      Nutritional Status BMI of 19 -24 = normal Nutritional Status Detail appetite "good"  Have you ever been in  a relationship where you felt threatened, hurt or afraid?No   Does patient need assistance? Functional Status Self care Ambulation Normal Comments no missed doses of rxes   Serial Vital Signs/Assessments:  Time      Position  BP       Pulse  Resp  Temp     By 9:01 AM   R Arm     155/105                        Jennet Maduro RN        Medication Adherence: 03/26/2010   Adherence to medications reviewed with patient. Counseling to provide adequate adherence provided   Prevention For Positives: 03/26/2010   Safe sex practices discussed with patient. Condoms offered.                             Physical Exam  General:  well-developed, well-nourished, and well-hydrated.   Eyes:  pupils  equal, pupils round, and pupils reactive to light.   Mouth:  pharynx pink and moist and no exudates.  small amt of thrush on ant palate.  Neck:  no masses.   Lungs:  normal respiratory effort and normal breath sounds.   Heart:  normal rate, regular rhythm, and no murmur.   Abdomen:  soft, non-tender, and normal bowel sounds.     Impression & Recommendations:  Problem # 1:  HIV DISEASE (ICD-042) her adherence is ? will check a genotype on her today with attn to [facility]. will have her back in 2-4 weeks to f/u on this. offered condoms. set up for PAP.  The following medications were removed from the medication list:    Diflucan 100 Mg Tabs (Fluconazole) .Marland Kitchen... Take 1 tablet by mouth once a day Her updated medication list for this problem includes:    Bactrim Ds 800-160 Mg Tabs (Sulfamethoxazole-trimethoprim) .Marland Kitchen... 1 tab po mwf  Problem # 2:  HYPERTENSION (ICD-401.9) not clear she is taking her meds.  Her updated medication list for this problem includes:    Hydrochlorothiazide 25 Mg Tabs (Hydrochlorothiazide) .Marland Kitchen... 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   Other Orders: T-HIV Genotype ([address]) Est. Patient Level IV ([address]11)  Prescriptions: HYDROCHLOROTHIAZIDE 25 MG TABS (HYDROCHLOROTHIAZIDE) Take one (1) by mouth daily  #90 x 3   Entered and Authorized by:   Johny Sax MD   Signed by:   Johny Sax MD on 03/26/2010   Method used:   Electron[facility] to        [facility] [address] [phone]* (retail)       [address]       [place], [place]  [address]       Ph: [phone] or [phone]       Fax: [phone]   RxID:   [id number] BACTRIM DS 800-160 MG TABS (SULFAMETHOXAZOLE-TRIMETHOPRIM) 1 tab po mwf  #30 x 3   Entered and Authorized by:   Johny Sax MD   Signed by:   Johny Sax MD on 03/26/2010   Method used:   [facility]ronically to          [facility] [address] [phone]* (retail)       [address]       [place]  [place], [place]  [address]       Ph: [phone] or [phone]       Fax: [phone]   RxID:   [id number] ISENTRESS 400 MG TABS (RALTEGRAVIR POTASSIUM) 1 tab two times a day  #90 x 3   Entered and Authorized by:   Johny Sax MD   Signed by:   Johny Sax MD on 03/26/2010   Method used:   [facility]ronically to        [facility] [address] [phone]* (retail)       [address]       [place], [place]  [address]       Ph: [phone] or [phone]       Fax: [phone]   RxID:   [id number] TRUVADA 200-300 MG TABS (EMTRICITABINE-TENOFOVIR) Take 1 tablet by mouth once a day  #90 x 3   Entered and Authorized by:   Johny Sax MD   Signed by:   Johny Sax MD on 03/26/2010   Method used:   Electronically to        [facility] [address] [phone]* (retail)       [address]       [place], [place]  [address]       Ph: [phone] or [phone]       Fax: [phone]   RxID:   [id number] NORVIR 100 MG CAPS (RITONAVIR) Take 1 tablet by mouth once a day  #90 x 3   Entered and Authorized by:   Johny Sax MD   Signed by:   Johny Sax MD on 03/26/2010   Method used:   Electronically to        [facility] [address] [phone]* (retail)       [address]       [place], [place]  [address]       Ph: [phone] or [phone]       Fax: [phone]   RxID:   [id number] REYATAZ 300 MG CAPS (ATAZANAVIR SULFATE) Take 1 tablet by mouth once a day  #90 x 3   Entered and Authorized by:   Johny Sax MD   Signed by:   Johny Sax MD on 03/26/2010   Method used:   Electronically to        [facility] [address] [phone]* (retail)       [address]       [place], [place]  [address]       Ph: [phone] or [phone]       Fax: [phone]   RxID:    [id number]         Medication Adherence: 03/26/2010   Adherence to medications reviewed with patient. Counseling to provide adequate adherence provided    Prevention For Positives: 03/26/2010   Safe sex practices discussed with patient. Condoms offered.

## 2010-12-11 NOTE — Assessment & Plan Note (Signed)
Summary: b-service hsfu/kam   CC:  b-service hsfu .  History of Present Illness: 35 yoF with hx of HIV+ on ISS/ATVr/TRV. Last CD4 80 and VL 1060 (Jan 2011).  Last Genotype naive.  Prev abn Genotypes (see PMHx).  at prev visit was complained of numbness in her arm and tingling. she was sent for MRI and had significant cord compression. On November 30, 2009 she underwent  anterior cervical decompression, C4-5, C5-6.  Arthrodesis C4-C5, 7-mm structural allograft and C5-C6, 6-mm structural allograft.  Anterior instrumentation 30-mm Synthes Vectra plate with 14 mm screws. Was d/c home on 12-02-09.  Today she states she is doing well. Pain and tingling in her L hand are better. Wound is healing well. has been pruritic.   Current Medications (verified): 1)  Reyataz 300 Mg Caps (Atazanavir Sulfate) .... Take 1 Tablet By Mouth Once A Day 2)  Norvir 100 Mg Caps (Ritonavir) .... Take 1 Tablet By Mouth Once A Day 3)  Truvada 200-300 Mg Tabs (Emtricitabine-Tenofovir) .... Take 1 Tablet By Mouth Once A Day 4)  Isentress 400 Mg Tabs (Raltegravir Potassium) .Marland Kitchen.. 1 Tab Two Times A Day 5)  Bactrim Ds 800-160 Mg Tabs (Sulfamethoxazole-Trimethoprim) .Marland Kitchen.. 1 Tab Po Mwf 6)  Hydrochlorothiazide 25 Mg Tabs (Hydrochlorothiazide) .... Take One (1) By Mouth Daily 7)  Diflucan 100 Mg Tabs (Fluconazole) .... Take 1 Tablet By Mouth Once A Day 8)  Ibuprofen 600 Mg Tabs (Ibuprofen) .... Two Times A Day As Needed For Shoulder Pain. With Food.  Allergies (verified): No Known Drug Allergies   Preventive Screening-Counseling & Management  Alcohol-Tobacco     Alcohol drinks/day: 0     Smoking Status: never  Caffeine-Diet-Exercise     Caffeine use/day: 0     Does Patient Exercise: yes     Type of exercise: walking when weather permits     Exercise (avg: min/session): <30     Times/week: <3  Safety-Violence-Falls     Seat Belt Use: yes   Updated Prior Medication List: REYATAZ 300 MG CAPS (ATAZANAVIR SULFATE)  Take 1 tablet by mouth once a day NORVIR 100 MG CAPS (RITONAVIR) Take 1 tablet by mouth once a day TRUVADA 200-300 MG TABS (EMTRICITABINE-TENOFOVIR) Take 1 tablet by mouth once a day ISENTRESS 400 MG TABS (RALTEGRAVIR POTASSIUM) 1 tab two times a day BACTRIM DS 800-160 MG TABS (SULFAMETHOXAZOLE-TRIMETHOPRIM) 1 tab po mwf HYDROCHLOROTHIAZIDE 25 MG TABS (HYDROCHLOROTHIAZIDE) Take one (1) by mouth daily DIFLUCAN 100 MG TABS (FLUCONAZOLE) Take 1 tablet by mouth once a day IBUPROFEN 600 MG TABS (IBUPROFEN) two times a day as needed for shoulder pain. with food.  Current Allergies (reviewed today): No known allergies  Past History:  Past medical, surgical, family and social histories (including risk factors) reviewed, and no changes noted (except as noted below).  Past Medical History: HIV disease     (10-09) M184V    (4-08)  Reverse Transcriptase Gene: P14S, V35I, S68G, R83K, V106I,     I178M, G196E, Q197K, T200I, L214F, E297K, D324E     Protease Gene: V3I, T4S, S37N, L63P      Hysterectomy due to fibroids Hypertension Spinal Cord Compression On November 30, 2009, anterior cervical decompression, C4-5, C5-6.  Arthrodesis C4-C5, 7-mm structural allograft and C5-C6, 6-mm structural allograft.  Anterior instrumentation 30-mm Synthes Vectra plate with 14 mm screws.  Past Surgical History: Reviewed history from 08/18/2006 and no changes required. Hysterectomy  Family History: Reviewed history from 05/02/2008 and no changes required. Family History Diabetes 1st degree relative  Social History: Reviewed history from 05/02/2008 and no changes required. Never Smoked Alcohol use-no Drug use-no  Review of Systems       no dizzyness or lightheadedness. constipation last PM.   Vital Signs:  Patient profile:   45 year old female Menstrual status:  postmenopausal Height:      62 inches (157.48 cm) Weight:      143.8 pounds (65.36 kg) BMI:     26.40 Temp:     97.3 degrees F (36.28  degrees C) oral Pulse rate:   116 / minute BP sitting:   100 / 69  (left arm)  Vitals Entered By: Baxter Hire) (December 11, 2009 3:21 PM) CC: b-service hsfu  Is Patient Diabetic? No Pain Assessment Patient in pain? no      Nutritional Status BMI of 25 - 29 = overweight Nutritional Status Detail appetite is fine per patient  Have you ever been in a relationship where you felt threatened, hurt or afraid?No   Does patient need assistance? Functional Status Self care Ambulation Normal        Medication Adherence: 12/11/2009   Adherence to medications reviewed with patient. Counseling to provide adequate adherence provided   Prevention For Positives: 12/11/2009   Safe sex practices discussed with patient. Condoms offered.                             Physical Exam  General:  well-developed, well-nourished, and well-hydrated.   Eyes:  pupils equal, pupils round, and pupils reactive to light.   Mouth:  pharynx pink and moist and no exudates.   Neck:  L sided wound- clean , well healing. nontender, no fluctuance.  Lungs:  normal respiratory effort and normal breath sounds.   Heart:  normal rate, regular rhythm, and no murmur.   Abdomen:  soft, non-tender, and normal bowel sounds.   Neurologic:  strength 5/5 UE, nl light touch BLE.    Impression & Recommendations:  Problem # 1:  HIV DISEASE (ICD-042) she is doing ok. will recheck her labs with her next visit. offered condoms. return to clinic 3 months.  Her updated medication list for this problem includes:    Bactrim Ds 800-160 Mg Tabs (Sulfamethoxazole-trimethoprim) .Marland Kitchen... 1 tab po mwf    Diflucan 100 Mg Tabs (Fluconazole) .Marland Kitchen... Take 1 tablet by mouth once a day  Problem # 2:  SPINAL CORD COMPRESSION (ICD-336.9) she is greatly improved. my great appreciation to the B service and especially to Dr Mikal Plane. She has Neurosurg f/u for wound eval.   Problem # 3:  HYPERTENSION (ICD-401.9) well controlled now. will  cont to watch.  Her updated medication list for this problem includes:    Hydrochlorothiazide 25 Mg Tabs (Hydrochlorothiazide) .Marland Kitchen... Take one (1) by mouth daily  Other Orders: Est. Patient Level IV ([address]) Future Orders: T-CD4SP ([facility]) (CD4SP) ... 03/11/2010 T-HIV Viral Load [phone]) ... 03/11/2010 T-Comprehensive Metabolic Panel [id number]) ... 03/11/2010 T-CBC w/Diff ([id number]) ... 03/11/2010  Process Orders Check Orders Results:     [facility]: Check successful Tests Sent for requisitioning (December 11, 2009 3:59 PM):     03/11/2010: [facility] -- T-HIV Viral Load [phone] (signed)     03/11/2010: [facility] -- T-Comprehensive Metabolic Panel [[id number]] (signed)     03/11/2010: [facility] -- [facility] w/Diff [[id number]] (signed)

## 2010-12-11 NOTE — Miscellaneous (Signed)
Summary: Bridge Counselor  Clinical Lists Changes BC made a home visit with pt today to watch her fill her pillbox. Pt was able to fill her pillbox with very little assistance today. Pt seems to be doing well. BC will make another home visit next Friday to check pt's pillbox and watch her fill up again for another week.

## 2010-12-11 NOTE — Miscellaneous (Signed)
Clinical Lists Changes  Medications: Removed medication of NORVIR 100 MG CAPS (RITONAVIR) Take 1 tablet by mouth once a day Added new medication of NORVIR 100 MG TABS (RITONAVIR) 1 once daily - Signed Rx of NORVIR 100 MG TABS (RITONAVIR) 1 once daily;  #30 x 3;  Signed;  Entered by: Starleen Arms CMA;  Authorized by: Johny Sax MD;  Method used: Faxed to [facility], [address], [place], [place]  [address], Ph: [phone], Fax: [phone] Rx of TRUVADA 200-300 MG TABS (EMTRICITABINE-TENOFOVIR) Take 1 tablet by mouth once a day;  #90 x 3;  Signed;  Entered by: Starleen Arms CMA;  Authorized by: Johny Sax MD;  Method used: Faxed to [facility], [address], [place], [place]  [address], Ph: [phone], Fax: [phone] Rx of ISENTRESS 400 MG TABS (RALTEGRAVIR POTASSIUM) 1 tab two times a day;  #180 x 3;  Signed;  Entered by: Starleen Arms CMA;  Authorized by: Johny Sax MD;  Method used: Faxed to [facility], [address], [place], [place]  [address], Ph: [phone], Fax: [phone] Rx of PREZISTA 400 MG TABS (DARUNAVIR ETHANOLATE) 2 tab by mouth once daily;  #180 x 3;  Signed;  Entered by: Starleen Arms CMA;  Authorized by: Johny Sax MD;  Method used: Faxed to [facility], [address], [place], [place]  [address], Ph: [phone], Fax: [phone] Rx of BACTRIM DS 800-160 MG TABS (SULFAMETHOXAZOLE-TRIMETHOPRIM) 1 tab po mwf;  #180 x 3;  Signed;  Entered by: Starleen Arms CMA;  Authorized by: Johny Sax MD;  Method used: Faxed to [facility], [address], [place], [place]  [address], Ph: [phone], Fax: [phone] Rx of HYDROCHLOROTHIAZIDE 25 MG TABS (HYDROCHLOROTHIAZIDE) Take one (1) by mouth daily;  #90 x 3;  Signed;  Entered by: Starleen Arms CMA;  Authorized by: Johny Sax MD;  Method used: Faxed to [facility], [address],  [place], [place]  [address], Ph: [phone], Fax: [phone] Rx of DIFLUCAN 100 MG TABS (FLUCONAZOLE) Take 1 tablet by mouth once a day;  #30 x 3;  Signed;  Entered by: Starleen Arms CMA;  Authorized by: Johny Sax MD;  Method used: Faxed to [facility], [address], [place], [place]  [address], Ph: [phone], Fax: [phone]    Prescriptions: DIFLUCAN 100 MG TABS (FLUCONAZOLE) Take 1 tablet by mouth once a day  #30 x 3   Entered by:   Starleen Arms CMA   Authorized by:   Johny Sax MD   Signed by:   Starleen Arms CMA on 09/25/2010   Method used:   Faxed to ...       [facility] (mail-order)       [address]       [place], [place]  [address]       Ph: [phone]       Fax: [phone]   RxID:   [id number] HYDROCHLOROTHIAZIDE 25 MG TABS (HYDROCHLOROTHIAZIDE) Take one (1) by mouth daily  #90 x 3   Entered by:   Starleen Arms CMA   Authorized by:   Johny Sax MD   Signed by:   Starleen Arms CMA on 09/25/2010   Method used:   Faxed to ...       [facility] ([facility])       [address]       [place], [place]  [address]  Ph: [phone]       Fax: [phone]   RxID:   [id number] BACTRIM DS 800-160 MG TABS (SULFAMETHOXAZOLE-TRIMETHOPRIM) 1 tab po mwf  #180 x 3   Entered by:   Starleen Arms CMA   Authorized by:   Johny Sax MD   Signed by:   Starleen Arms CMA on 09/25/2010   Method used:   Faxed to ...       [facility] (mail-order)       [address]       [place], [place]  [address]       Ph: [phone]       Fax: [phone]   RxID:   [id number] PREZISTA 400 MG TABS (DARUNAVIR ETHANOLATE) 2 tab by mouth once daily  #180 x 3   Entered by:   Starleen Arms CMA   Authorized by:   Johny Sax MD   Signed by:   Starleen Arms CMA on 09/25/2010   Method used:   Faxed to ...       [facility] (mail-order)       [address]       [place], [place]  [address]       Ph: [phone]       Fax: [phone]   RxID:   [id number] ISENTRESS 400 MG TABS (RALTEGRAVIR POTASSIUM) 1 tab two times a day  #180 x 3   Entered by:   Starleen Arms CMA   Authorized by:   Johny Sax MD   Signed by:   Starleen Arms CMA on 09/25/2010   Method used:   Faxed to ...       [facility] (mail-order)       [address]       [place], [place]  [address]       Ph: [phone]       Fax: [phone]   RxID:   [id number] TRUVADA 200-300 MG TABS (EMTRICITABINE-TENOFOVIR) Take 1 tablet by mouth once a day  #90 x 3   Entered by:   Starleen Arms CMA   Authorized by:   Johny Sax MD   Signed by:   Starleen Arms CMA on 09/25/2010   Method used:   Faxed to ...       [facility] (mail-order)       [address]       [place], [place]  [address]       Ph: [phone]       Fax: [phone]   RxID:   [id number] NORVIR 100 MG TABS (RITONAVIR) 1 once daily  #30 x 3   Entered by:   Starleen Arms CMA   Authorized by:   Johny Sax MD   Signed by:   Starleen Arms CMA on 09/25/2010   Method used:   Faxed to ...       [facility] (mail-order)       [address]       [place], [place]  [address]       Ph: [phone]       Fax: [phone]   RxID:   [id number]

## 2010-12-11 NOTE — Progress Notes (Signed)
Summary: Continued pain in arm.   Phone Note Call from Patient   Summary of Call: Her arm is still hurting. She said Dr Ninetta Lights told her to call back and get an  xray of arm.  Initial call taken by: Tomasita Morrow RN,  November 18, 2009 2:33 PM  Follow-up for Phone Call        11-21-08  Return call.  Her arm is hurting badly and she would like an xray to be ordered.  Tomasita Morrow RN  November 21, 2009 4:39 PM  Follow-up by: Tomasita Morrow RN,  November 21, 2009 4:39 PM  Additional Follow-up for Phone Call Additional follow up Details #1::        will check MRI

## 2010-12-11 NOTE — Miscellaneous (Signed)
Summary: Bridge Counselor  Clinical Lists Changes BC made a home visit today to check pt's pillbox. Pt's pillbox was filled correctly, however, she still had pills in her Wednesday AM slot. Pt reports that she was "busy" that day and just forgot. Pt also reports that she missed a dose over the holidays. BC stressed the importance of taking her medications every day again with pt today. Due to pt's progress and her ability to fill her pillbox on her own, [facility] will not continue to make weekly home visits. BC also assisted pt with scheduling her three month lab and MD visits and will call pt the day before each appointment as a reminder.

## 2010-12-11 NOTE — Miscellaneous (Signed)
Summary: Hosp. D/C: s/p surgery for cervical cord compression  Hospital Discharge  Date of admission: 11/29/2009  Date of discharge: 12/02/2009  Brief reason for admission/active problems: Patient was a direct admission for worsening numbness in 1st and 2nd digits of L arm after having had pain in the arm since Nov. Was seen and scheduled for neurosurgical intervention given results of a MRI showing cervical cord compression. Patient will have a followup appointment with Dr. Franky Macho, neurosurgery, in 3-4 weeks from time of discharge.  Followup needed: 1. Please evaluate patient and ask about pain in L arm or any continued sensory deficits. 2. Please check her blood pressure as it was initially elevated on admission (likely had a component of anxiety) - her blood pressures were well controlled following the operation on her home medication. 3. She has another appointment scheduled for April - she likely will still need that appointment as this will be a hospital f/u visit, but if not, please have her reschedule.  The medication and problem lists have been updated.  Please see the dictated discharge summary for details.   Observations: Added new observation of INSTRUCTIONS: You have a followup appointment with Dr. Ninetta Lights on Feb. 2nd at 3:00 pm. Please call to reschedule if you are unable to keep your appointment. Please followup with Neurosurgery as an outpatient as scheduled, please call for an appointment to be scheduled in 3-4 weeks per Dr. Sueanne Margarita orders. (12/02/2009 10:17)      Patient Instructions: 1)  You have a followup appointment with Dr. Ninetta Lights on Feb. 2nd at 3:00 pm. Please call to reschedule if you are unable to keep your appointment. 2)  Please followup with Neurosurgery as an outpatient as scheduled, please call for an appointment to be scheduled in 3-4 weeks per Dr. Sueanne Margarita orders.

## 2010-12-11 NOTE — Miscellaneous (Signed)
Summary: Bridge Counselor  Clinical Lists Changes BC enrolled pt into the bridge counseling program today. Pt did not have her prezista or her bactrim today. They were never picked up from the pharmacy on 09/11/10. Pt had full bottles of her Truvada, Isentress and Norvir but the fill dates on these bottles were all over six months old. Pt will obtain all of her medications today and BC will make a home visit tomorrow to provide Tx adherence and assistace with filling her pillbox.

## 2010-12-11 NOTE — Medication Information (Signed)
Summary: [facility]: RX  [facility]: RX   Imported By: Florinda Marker 10/08/2010 09:33:14  _____________________________________________________________________  External Attachment:    Type:   Image     Comment:   External Document

## 2010-12-11 NOTE — Assessment & Plan Note (Signed)
Summary: f/u visit /dde   CC:  r upper side pain.  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 Last CD4 80 and VL 1060 (Jan 2011).  Last Genotype naive.  Prev abn Genotypes (see PMHx).  at prev visit was complained of numbness in her arm and tingling. she was sent for MRI and had significant cord compression. On November 30, 2009 she underwent  anterior cervical decompression, C4-5, C5-6.  Arthrodesis C4-C5, 7-mm structural allograft and C5-C6, 6-mm structural allograft.  Anterior instrumentation 30-mm Synthes Vectra plate with 14 mm screws. Was d/c home on 12-02-09.   CD4 90 an VL 8280, H/H 11.7/35.9, AST 64 (03-12-10). Had sensitivity testing done for ISN which showed only background mutation. At her f/u visit in June 2011 she was changed to [facility] from ATV. She was sched for 6 week f/u (which she clearly missed).  She has not changed her medicines. SHe states she has been taking her medicines.  Also referred for GYN for abn PAP- this has not happened.  neck feels much better. no paresthesias.  Has had some sharp pains in her R flank. intermittent. passing urine well. nl BM.Pain unchanged with eating. Unchanged with deep breath.   Preventive Screening-Counseling & Management  Alcohol-Tobacco     Alcohol drinks/day: 0     Smoking Status: never   Updated Prior Medication List: NORVIR 100 MG CAPS (RITONAVIR) Take 1 tablet by mouth once a day TRUVADA 200-300 MG TABS (EMTRICITABINE-TENOFOVIR) Take 1 tablet by mouth once a day ISENTRESS 400 MG TABS (RALTEGRAVIR POTASSIUM) 1 tab two times a day BACTRIM DS 800-160 MG TABS (SULFAMETHOXAZOLE-TRIMETHOPRIM) 1 tab po mwf HYDROCHLOROTHIAZIDE 25 MG TABS (HYDROCHLOROTHIAZIDE) Take one (1) by mouth daily PREZISTA 400 MG TABS (DARUNAVIR ETHANOLATE) 2 tab by mouth once daily DIFLUCAN 100 MG TABS (FLUCONAZOLE) Take 1 tablet by mouth once a day  Current Allergies: No known allergies  Past History:  Past medical, surgical, family  and social histories (including risk factors) reviewed, and no changes noted (except as noted below).  Past Medical History: Reviewed history from 04/23/2010 and no changes required. 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       Hysterectomy due to fibroids Hypertension Spinal Cord Compression On November 30, 2009, anterior cervical decompression, C4-5, C5-6.  Arthrodesis C4-C5, 7-mm structural allograft and C5-C6, 6-mm structural allograft.  Anterior instrumentation 30-mm Synthes Vectra plate with 14 mm screws.  Past Surgical History: Reviewed history from 08/18/2006 and no changes required. Hysterectomy  Family History: Reviewed history from 05/02/2008 and no changes required. Family History Diabetes 1st degree relative  Social History: Reviewed history from 05/02/2008 and no changes required. Never Smoked Alcohol use-no Drug use-no  Review of Systems       wt up 4#.   Vital Signs:  Patient profile:   45 year old female Menstrual status:  hysterectomy due to fibroids, 1985 Height:      62 inches (157.48 cm) Weight:      139 pounds (63.18 kg) BMI:     25.52 Temp:     99.3 degrees F (37.39 degrees C) BP sitting:   123 / 82  (left arm)  Vitals Entered By: Starleen Arms CMA (September 11, 2010 9:36  AM) CC: r upper side pain Is Patient Diabetic? No Pain Assessment Patient in pain? yes     Location: r side Intensity: 8 Type: sharp Nutritional Status BMI of 25 - 29 = overweight  Does patient need assistance? Functional Status Self care Ambulation Normal   Physical Exam  General:  well-developed, well-nourished, and well-hydrated.   Eyes:  pupils equal, pupils round, and pupils reactive to light.   Mouth:  pharynx pink and moist.  thrush Neck:  no masses.    Lungs:  normal respiratory effort and normal breath sounds.   Heart:  normal rate, regular rhythm, and no murmur.   Abdomen:  soft, non-tender, normal bowel sounds, no masses, no guarding, no rigidity, no rebound tenderness, no abdominal hernia, and no hepatomegaly.          Medication Adherence: 09/11/2010   Adherence to medications reviewed with patient. Counseling to provide adequate adherence provided   Prevention For Positives: 09/11/2010   Safe sex practices discussed with patient. Condoms offered.                             Impression & Recommendations:  Problem # 1:  HIV DISEASE (ICD-042)  she is unclear about her meds. will have adherence help her in deciphering these. will recheck her labs today. she gets flu shot and condoms today. meds refilled.  return to clinic 3 months.  Her updated medication list for this problem includes:    Bactrim Ds 800-160 Mg Tabs (Sulfamethoxazole-trimethoprim) .Marland Kitchen... 1 tab po mwf    Diflucan 100 Mg Tabs (Fluconazole) .Marland Kitchen... Take 1 tablet by mouth once a day  Orders: Gynecologic Referral (Gyn)  Problem # 2:  ASCUS PAP (ICD-795.01)  will send her to GYN.   Orders: Gynecologic Referral (Gyn)  Problem # 3:  SPINAL CORD COMPRESSION (ICD-336.9) appears to be doing well.   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 probably a marker of ART non-adherence.    Medications Added to Medication List This Visit: 1)  Truvada 200-300 Mg Tabs (Emtricitabine-tenofovir) .... Take 1 tablet by mouth once a day 2)  Bactrim Ds 800-160 Mg Tabs (Sulfamethoxazole-trimethoprim) .Marland Kitchen.. 1 tab po mwf 3)  Prezista 400 Mg Tabs (Darunavir ethanolate) .... 2 tab by mouth once daily 4)  Diflucan 100 Mg Tabs (Fluconazole) .... Take 1 tablet by mouth once a day  Other Orders: Est. Patient Level IV ([address]) T-CD4SP ([facility]) (CD4SP) T-HIV Viral Load [phone]) T-Comprehensive Metabolic Panel [phone]) T-CBC w/Diff  ([id number])  Prescriptions: DIFLUCAN 100 MG TABS (FLUCONAZOLE) Take 1 tablet by mouth once a day  #30 x 3   Entered and Authorized by:   Johny Sax MD   Signed by:   Johny Sax MD on 09/11/2010   Method used:   [facility]        [facility] [address] [phone]* (retail)       [address]       [place], [place]  [address]       Ph: [phone] or [phone]       Fax: [phone]   RxID:   [id number] PREZISTA 400 MG TABS (DARUNAVIR ETHANOLATE) 2 tab by mouth once daily  #180 x 3   Entered and Authorized by:   Johny Sax MD   Signed by:   Johny Sax MD on 09/11/2010   Method used:   [facility]  [facility] [address] [phone]* (retail)       [address]       [place], [place]  [address]       Ph: [phone] or [phone]       Fax: [phone]   RxID:   [id number] HYDROCHLOROTHIAZIDE 25 MG TABS (HYDROCHLOROTHIAZIDE) Take one (1) by mouth daily  #90 x 3   Entered and Authorized by:   Johny Sax MD   Signed by:   Johny Sax MD on 09/11/2010   Method used:   [facility]        [facility] [address] [phone]* (retail)       [address]       [place], [place]  [address]       Ph: [phone] or [phone]       Fax: [phone]   RxID:   [id number] BACTRIM DS 800-160 MG TABS (SULFAMETHOXAZOLE-TRIMETHOPRIM) 1 tab po mwf  #180 x 3   Entered and Authorized by:   Johny Sax MD   Signed by:   Johny Sax MD on 09/11/2010   Method used:   [facility] to        [facility] [address] [phone]* (retail)       [address]       [place], [place]  [address]       Ph: [phone] or [phone]       Fax: [phone]   RxID:   [id number] ISENTRESS 400 MG TABS (RALTEGRAVIR POTASSIUM) 1 tab two times a day  #180 x 3   Entered and Authorized by:   Johny Sax MD   Signed by:   Johny Sax MD on  09/11/2010   Method used:   [facility]ronically to        [facility] [address] [phone]* (retail)       [address]       [place], [place]  [address]       Ph: [phone] or [phone]       Fax: [phone]   RxID:   [id number] TRUVADA 200-300 MG TABS (EMTRICITABINE-TENOFOVIR) Take 1 tablet by mouth once a day  #90 x 3   Entered and Authorized by:   Johny Sax MD   Signed by:   Johny Sax MD on 09/11/2010   Method used:   [facility]ron[facility] to        [facility] [address] [phone]* (retail)       [address]       [place], [place]  [address]       Ph: [phone] or [phone]       Fax: [phone]   RxID:   [id number] NORVIR 100 MG CAPS (RITONAVIR) Take 1 tablet by mouth once a day  #90 x 3   Entered and Authorized by:   Johny Sax MD   Signed by:   Johny Sax MD on 09/11/2010   Method used:   [facility]ronically to        [facility] [address] [phone]* (retail)       [address]       [place], [place]  [address]       Ph: [phone] or [phone]  Fax: [phone]   RxID:   [id number]   Appended Document: f/u visit /dde    Clinical Lists Changes  Orders: Added new Service order of Flu Vaccine 51yrs + [phone]) - Signed Added new Service order of Admin 1st Vaccine (784[address]) - Signed Observations: Added new observation of FLU VAX#1VIS: 06/03/10 version given September 11, 2010. (09/11/2010 10:09) Added new observation of FLU VAXLOT: 1103 3p (09/11/2010 10:09) Added new observation of FLU VAX EXP: 02/08/2011 (09/11/2010 10:09) Added new observation of FLU VAXBY: Tamika Yarborough CMA (09/11/2010 10:09) Added new observation of FLU VAXRTE: IM (09/11/2010 10:09) Added new observation of FLU VAX DSE: 0.5 ml (09/11/2010 10:09) Added new observation of FLU VAX SITE: left deltoid (09/11/2010 10:09) Added new observation of FLU VAX: Fluvax 3+ (09/11/2010  10:09)       Immunizations Administered:  Influenza Vaccine # 1:    Vaccine Type: Fluvax 3+    Site: left deltoid    Dose: 0.5 ml    Route: IM    Given by: Starleen Arms CMA    Exp. Date: 02/08/2011    Lot #: [id number]    VIS given: 06/03/10 version given September 11, 2010.  Flu Vaccine Consent Questions:    Do you have a history of severe allergic reactions to this vaccine? no    Any prior history of allergic reactions to egg and/or gelatin? no    Do you have a sensitivity to the preservative Thimersol? no    Do you have a past history of Guillan-Barre Syndrome? no    Do you currently have an acute febrile illness? no    Have you ever had a severe reaction to latex? no    Vaccine information given and explained to patient? yes    Are you currently pregnant? no

## 2010-12-11 NOTE — Assessment & Plan Note (Signed)
Summary: Hospital Admission  INTERNAL MEDICINE TEACHING SERVICE ADMISSION HISTORY AND PHYSICAL    Attending: Dr. Coralee Pesa. R1: Dr. Logan Bores [phone] R2: Dr. Gwenlyn Perking [phone]  PCP: Dr. Ninetta Lights  CC: L upper extremity numbness and pain.  HPI: Patient is a 45 year old female with a PMH of HIV (last CD4=80 on 11/14/2009, viral load=1060) and HTN; who was admitted for progressing LUE pain and numbness and MRI showing cervical stenosis, C4/C5 disc protrusion and compression at the level of C5/C6. She reports having had symptoms of pain in the LUE since Nov. 23, 2010 and recently developed increasing numbness in the 1st and 2nd digits of the L hand, which prompted evaluation with magnetic resonance imaging. Patient states that the numbness seems worse just prior to going to bed and when she awakens in the morning. She cannot think of anything that exacerbates or relieves the symptoms, but they seem to vary in intensity. Currently, she denies significant pain and has minimal numbness in the affected 1st and 2nd digits of the left hand. Movement of her L arm and neck do not seem to change her symptoms. Once the results of the MRI were read, patient was notified of the concering findings and was told to come to the hospital for further evaluation and possibly surgery.  ALLERGIES: NKDA  PAST MEDICAL HISTORY: HIV disease     (10-09) M184V     (4-08)  Reverse Transcriptase Gene: P14S, V35I, S68G, R83K, V106I,     I178M, G196E, Q197K, T200I, L214F, E297K, D324E     Protease Gene: V3I, T4S, S37N, L63P    Hysterectomy due to fibroids Hypertension   MEDICATIONS: REYATAZ 300 MG CAPS (ATAZANAVIR SULFATE) Take 1 tablet by mouth once a day NORVIR 100 MG CAPS (RITONAVIR) Take 1 tablet by mouth once a day TRUVADA 200-300 MG TABS (EMTRICITABINE-TENOFOVIR) Take 1 tablet by mouth once a day ISENTRESS 400 MG TABS (RALTEGRAVIR POTASSIUM) 1 tab two times a day BACTRIM DS 800-160 MG TABS  (SULFAMETHOXAZOLE-TRIMETHOPRIM) 1 tab po mwf HYDROCHLOROTHIAZIDE 25 MG TABS (HYDROCHLOROTHIAZIDE) Take one (1) by mouth daily DIFLUCAN 100 MG TABS (FLUCONAZOLE) Take 1 tablet by mouth once a day IBUPROFEN 600 MG TABS (IBUPROFEN) two times a day as needed for shoulder pain. with food.   SOCIAL HISTORY: Never Smoked Alcohol use-no Drug use-no  FAMILY HISTORY Family History Diabetes 1st degree relative   ROS: Negative except as per HPI.  VITALS: T: 97.9 P: 148 BP: 170/108 --> 179/105 R: 18 O2SAT: 100% ON: RA  PHYSICAL EXAM: GEN: Sitting in chair with several family members in room with her. Appears somewhat anxious. No acute distress. NECK: Supple, full ROM, no appreciable thyromegaly or masses. No cervical tenderness with palpation over C-spine. RESP: CTA B without wheezes, rales or rhonchi. CV: Tachycardic in 120s on exam. Regular rhythm, no murmurs, rubs or gallops. GI: Soft, NT, ND, +BS, no guarding. EXT: Moves all extremities, no edema. SKIN: No rashes. NEURO: A&O x3. Patient has 5/5 strength in upper extremities bilaterally. No pain with passive ROM in LUE. Relexes 2+ on R biceps and brachioradialis. 3+ reflexes on L arm at biceps and brachioradialis. PSYCH: Appropriate for the situation. Doesn't appear depressed, but does appear anxious.   LABS: None at the time patient was seen as she was a direct admission.   MRI Neck: IMPRESSION: 1.  Severe left central stenosis at C4-5 secondary to a large left paracentral disc protrusion.  No focal cord signal abnormality is compatible with myelomalacia. 2.  Moderate to central  canal stenosis at C5-6 is worse on the right.  Abnormal cord signal is present in the right side of the cord. 3.  Multilevel foraminal disease secondary to uncovertebral disease as described above.    ASSESSMENT AND PLAN: (1) Cervical cord compression: Given MRI findings, patient admitted for further evaluation of symptoms and cord compression. Have  discussed patient's care with Dr. Franky Macho, neurosurgery, and she will most likely undergo surgery tomorrow morning. Will make patient NPO after midnight and check basic labs, including coagulation panel. Has morphine written for pain to be given as needed.  (2) Hypertension: Patient currently on HCTZ at home, first set of vitals taken with elevated blood pressures - although in clinic patient has blood pressures ranging from 120s-140s systolics at most visits. Will add 20 mg IV labetalol as patient was tachyardic as well, likely secondary to anxiety. Will continue to follow and will adjust her medications as needed. (3) Tachycardia: will control her anxiety, and will give one dose of B-blocker 20mg  iv. (4) HIV disease: Will place patient on home medications as well as meds for prophylaxis given low CD4 count last measured a few weeks ago at 80. Last viral load was 1060 at the same time; will discuss further medication adjustment with Dr. Ninetta Lights.  (5) Anxiety: Given situation, will provide low dose of ativan two times a day as needed for anxiety. Have reassured patient regarding the plan. (6) FEN: Normal saline lock. Low salt, heart healthy diet. Will check CMET. (7) VTE ppx: SCDs and ambulation.   Appended Document: Hospital Admission Pt seen, examined and discussed in full details with Dr. Logan Bores. Agree with assesment and plan. Pt with worsening pain and numbness in her left arm; MRI positive for spinal cord compression. We have talked with Dr. Franky Macho and the plan is to perform surgery tomorrow morning. Rest of her problems as outlined in Dr. Logan Bores note.

## 2010-12-11 NOTE — Assessment & Plan Note (Signed)
Summary: F/U/VS   CC:  1 month follow up.  History of Present Illness: 45 yoF with hx of HIV+ on ISS/ATVr/TRV. Last CD4 80 and VL 1060 (Jan 2011).  Last Genotype naive.  Prev abn Genotypes (see PMHx).  at prev visit was complained of numbness in her arm and tingling. she was sent for MRI and had significant cord compression. On November 30, 2009 she underwent  anterior cervical decompression, C4-5, C5-6.  Arthrodesis C4-C5, 7-mm structural allograft and C5-C6, 6-mm structural allograft.  Anterior instrumentation 30-mm Synthes Vectra plate with 14 mm screws. Was d/c home on 12-02-09.   CD4 90 an VL 8280, H/H 11.7/35.9, AST 64 (03-12-10). Had sensitivity testing done for ISN which showed only background mutation. She c/o feeling drowsy today after going to see dentist and getting pain meds.    Preventive Screening-Counseling & Management  Alcohol-Tobacco     Alcohol drinks/day: 0     Smoking Status: never  Caffeine-Diet-Exercise     Caffeine use/day: tea occassionally     Does Patient Exercise: yes     Type of exercise: walking when weather permits     Exercise (avg: min/session): <30     Times/week: <3  Safety-Violence-Falls     Seat Belt Use: yes   Updated Prior Medication List: REYATAZ 300 MG CAPS (ATAZANAVIR SULFATE) Take 1 tablet by mouth once a day NORVIR 100 MG CAPS (RITONAVIR) Take 1 tablet by mouth once a day TRUVADA 200-300 MG TABS (EMTRICITABINE-TENOFOVIR) Take 1 tablet by mouth once a day ISENTRESS 400 MG TABS (RALTEGRAVIR POTASSIUM) 1 tab two times a day BACTRIM DS 800-160 MG TABS (SULFAMETHOXAZOLE-TRIMETHOPRIM) 1 tab po mwf HYDROCHLOROTHIAZIDE 25 MG TABS (HYDROCHLOROTHIAZIDE) Take one (1) by mouth daily  Current Allergies (reviewed today): No known allergies  Past History:  Past medical, surgical, family and social histories (including risk factors) reviewed, and no changes noted (except as noted below).  Past Medical History: HIV disease     (10-09)  M184V    (4-08)  Reverse Transcriptase Gene: P14S, V35I, S68G, R83K, V106I,     I178M, G196E, Q197K, T200I, L214F, E297K, D324E     Protease Gene: V3I, T4S, S37N, L63P    (03-2010) E157Q (background mutation for ISN).       Hysterectomy due to fibroids Hypertension Spinal Cord Compression On November 30, 2009, anterior cervical decompression, C4-5, C5-6.  Arthrodesis C4-C5, 7-mm structural allograft and C5-C6, 6-mm structural allograft.  Anterior instrumentation 30-mm Synthes Vectra plate with 14 mm screws.  Past Surgical History: Reviewed history from 08/18/2006 and no changes required. Hysterectomy  Family History: Reviewed history from 05/02/2008 and no changes required. Family History Diabetes 1st degree relative  Social History: Reviewed history from 05/02/2008 and no changes required. Never Smoked Alcohol use-no Drug use-no  Review of Systems       wt steady. can remember shapes and colors of meds, not names.   Vital Signs:  Patient profile:   45 year old female Menstrual status:  hysterectomy due to fibroids, 1985 Height:      62 inches (157.48 cm) Weight:      135.8 pounds (61.73 kg) BMI:     24.93 Temp:     98.6 degrees F oral Pulse rate:   96 / minute BP sitting:   109 / 68  (right arm)  Vitals Entered By: Baxter Hire) (April 23, 2010 3:30 PM) CC: 1 month follow up Pain Assessment Patient in pain? no      Nutritional Status BMI  of 19 -24 = normal Nutritional Status Detail appetite is good per patient  Have you ever been in a relationship where you felt threatened, hurt or afraid?No   Does patient need assistance? Functional Status Self care Ambulation Normal   Physical Exam  General:  well-developed, well-nourished, and well-hydrated.   Eyes:  pupils equal, pupils round, and pupils reactive to light.   Mouth:  pharynx pink and moist.  palatal thrush.  Neck:  no masses.   Lungs:  normal respiratory effort and normal breath sounds.     Heart:  normal rate, regular rhythm, and no murmur.   Abdomen:  soft, non-tender, and normal bowel sounds.      Impression & Recommendations:  Problem # 1:  HIV DISEASE (ICD-042) will change her ATV to DRV to see if we can get her to undetectable and improver her CD4. return to clinic 6-8 weeks.  Her updated medication list for this problem includes:    Bactrim Ds 800-160 Mg Tabs (Sulfamethoxazole-trimethoprim) .Marland Kitchen... 1 tab po mwf    Diflucan 100 Mg Tabs (Fluconazole) .Marland Kitchen... Take 1 tablet by mouth once a day  Problem # 2:  ASCUS PAP (ICD-795.01) we are making referal to [facility] hospital for this.    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   Problem # 4:  SPINAL CORD COMPRESSION (ICD-336.9) doing well. has some occas stiffness.   Medications Added to Medication List This Visit: 1)  Prezista 400 Mg Tabs (Darunavir ethanolate) .... 2 tab by mouth once daily 2)  Diflucan 100 Mg Tabs (Fluconazole) .... Take 1 tablet by mouth once a day  Other Orders: Est. Patient Level IV (57846)  Prescriptions: DIFLUCAN 100 MG TABS (FLUCONAZOLE) Take 1 tablet by mouth once a day  #10 x 1   Entered and Authorized by:   Johny Sax MD   Signed by:   Johny Sax MD on 04/23/2010   Method used:   Electronically to        [facility] [phone]* (retail)       [address]       [place], [place]  [address]       Ph: [phone] or [phone]       Fax: [phone]   RxID:   [id number] PREZISTA 400 MG TABS (DARUNAVIR ETHANOLATE) 2 tab by mouth once daily  #120 x 3   Entered and Authorized by:   Johny Sax MD   Signed by:   Johny Sax MD on 04/23/2010   Method used:   [facility]        [facility] [address] [phone]* (retail)       [address]       [place], [place]  [address]       Ph: [phone] or [phone]       Fax: [phone]   RxID:   [id number]

## 2010-12-11 NOTE — Miscellaneous (Signed)
Summary: Bridge Counselor  Clinical Lists Changes BC made another home visit today to watch pt fill her pillbox. When [facility] got there, pt had already filled her pillbox and it was filled correctly! BC had given the pt a list of her medications with pictures of each and when to take them. Pt reports that she used the list to fill her pillbox. Pt is getting more comfortable with her medications each week. [facility] delivered her meds yesterday so pt is set for another month. Pt doing really well. BC will assist pt with scheduling her follow up lab and MD appointments the first of the year.

## 2010-12-11 NOTE — Miscellaneous (Signed)
Summary: Orders Update - labs  Clinical Lists Changes  Orders: Added new Test order of T-CBC w/Diff [phone]) - Signed Added new Test order of T-CD4SP [facility]) (CD4SP) - Signed Added new Test order of T-Comprehensive Metabolic Panel [phone]) - Signed Added new Test order of T-HIV Viral Load [phone]) - Signed     Process Orders Check Orders Results:     [facility]: ABN not required for this insurance Order queued for requisitioning for [facility]: June 12, 2010 2:18 PM  Tests Sent for requisitioning (June 12, 2010 2:18 PM):     07/15/2010: [facility] -- T-CBC w/Diff [[id number]] (signed)     07/15/2010: [facility] -- T-Comprehensive Metabolic Panel [[id number]] (signed)     07/15/2010: [facility] -- T-HIV Viral Load [phone] (signed)

## 2010-12-11 NOTE — Miscellaneous (Signed)
Summary: Bridge Counselor  Clinical Lists Changes BC made another homevisit today to watch pt refill her pillbox. Pt still had a Truvada and Norvir pill in two of her am slots. BC discussed with pt again today the importance of taking ALL of her medications everyday for them to work properly. Pt expressed understanding. Pt's pillbox is filled appropriately. BC also let pt know that her Norvir was changed to the tablet form due to her problem swallowing the large caspsules. [facility] will make pt's first medication delivery next Tuesday. Pt will call BC once she recieves her medications.   Appended Document: Bridge Counselor BC's homevisit was on September 26, 2010

## 2010-12-11 NOTE — Miscellaneous (Signed)
Summary: screening mammography   Clinical Lists Changes  Problems: Added new problem of PREVENTIVE HEALTH CARE (ICD-V70.0) Orders: Added new Test order of Mammogram (Screening) (Mammo) - Signed

## 2010-12-11 NOTE — Assessment & Plan Note (Signed)
Summary: left hand thumb and 1st finger numbness/tkk   CC:  finger on left hand have been hurting, numbness and tingling, and started recently.  History of Present Illness: 17 yoF with hx of HIV+ on ISS/ATVr/TRV. Last CD4 60 and VL 6870 (10-01-09).  Last Genotype naive.  Prev abn Genotypes (see PMHx).  At prev visit was c/o L shoulder pain. has been taking ibuprofen and has improved. Now c/o numbness and tingling in her L 1st and 2nd digits. is worse when she gets up in the AM.  taking art well, denies missed. occas has trouble getting them to go down.   Preventive Screening-Counseling & Management  Alcohol-Tobacco     Alcohol drinks/day: 0     Smoking Status: never  Caffeine-Diet-Exercise     Caffeine use/day: tea     Does Patient Exercise: yes     Type of exercise: walking when weather permits     Exercise (avg: min/session): <30     Times/week: <3  Hep-HIV-STD-Contraception     HIV Risk: risk noted  Safety-Violence-Falls     Seat Belt Use: yes  Comments: condoms declined      Sexual History:  currently monogamous.        Drug Use:  never.     Updated Prior Medication List: REYATAZ 300 MG CAPS (ATAZANAVIR SULFATE) Take 1 tablet by mouth once a day NORVIR 100 MG CAPS (RITONAVIR) Take 1 tablet by mouth once a day TRUVADA 200-300 MG TABS (EMTRICITABINE-TENOFOVIR) Take 1 tablet by mouth once a day ISENTRESS 400 MG TABS (RALTEGRAVIR POTASSIUM) 1 tab two times a day BACTRIM DS 800-160 MG TABS (SULFAMETHOXAZOLE-TRIMETHOPRIM) 1 tab po mwf HYDROCHLOROTHIAZIDE 25 MG TABS (HYDROCHLOROTHIAZIDE) Take one (1) by mouth daily DIFLUCAN 100 MG TABS (FLUCONAZOLE) Take 1 tablet by mouth once a day IBUPROFEN 600 MG TABS (IBUPROFEN) two times a day as needed for shoulder pain. with food.  Current Allergies (reviewed today): No known allergies  Past History:  Past medical, surgical, family and social histories (including risk factors) reviewed, and no changes noted (except as noted  below).  Past Medical History: Reviewed history from 06/12/2009 and no changes required. HIV disease     (10-09) M184V    (4-08)  Reverse Transcriptase Gene: P14S, V35I, S68G, R83K, V106I,     I178M, G196E, Q197K, T200I, L214F, E297K, D324E     Protease Gene: V3I, T4S, S37N, L63P      Hysterectomy due to fibroids Hypertension  Past Surgical History: Reviewed history from 08/18/2006 and no changes required. Hysterectomy  Family History: Reviewed history from 05/02/2008 and no changes required. Family History Diabetes 1st degree relative  Social History: Reviewed history from 05/02/2008 and no changes required. Never Smoked Alcohol use-no Drug use-no Drug Use:  never  Additional History Menstrual Status:  postmenopausal  Vital Signs:  Patient profile:   45 year old female Menstrual status:  postmenopausal Height:      62 inches (157.48 cm) Weight:      140.2 pounds (63.73 kg) BMI:     25.74 Temp:     96.7 degrees F (35.94 degrees C) oral Pulse rate:   100 / minute BP sitting:   132 / 76  (left arm) Cuff size:   regular  Vitals Entered By: Jennet Maduro RN (November 14, 2009 3:14 PM)  Current Medications (verified): 1)  Reyataz 300 Mg Caps (Atazanavir Sulfate) .... Take 1 Tablet By Mouth Once A Day 2)  Norvir 100 Mg Caps (Ritonavir) .... Take 1 Tablet  By Mouth Once A Day 3)  Truvada 200-300 Mg Tabs (Emtricitabine-Tenofovir) .... Take 1 Tablet By Mouth Once A Day 4)  Isentress 400 Mg Tabs (Raltegravir Potassium) .Marland Kitchen.. 1 Tab Two Times A Day 5)  Bactrim Ds 800-160 Mg Tabs (Sulfamethoxazole-Trimethoprim) .Marland Kitchen.. 1 Tab Po Mwf 6)  Hydrochlorothiazide 25 Mg Tabs (Hydrochlorothiazide) .... Take One (1) By Mouth Daily 7)  Diflucan 100 Mg Tabs (Fluconazole) .... Take 1 Tablet By Mouth Once A Day 8)  Ibuprofen 600 Mg Tabs (Ibuprofen) .... Two Times A Day As Needed For Shoulder Pain. With Food.  Allergies: No Known Drug Allergies  CC: finger on left hand have been hurting,  numbness and tingling, started recently Pain Assessment Patient in pain? yes     Location: left hand Onset of pain  numbness and tingling Nutritional Status BMI of 19 -24 = normal Nutritional Status Detail appetite "OK"  Have you ever been in a relationship where you felt threatened, hurt or afraid?No   Does patient need assistance? Functional Status Self care Ambulation Normal Comments no missed doses of HIV rxews     Menstrual Status postmenopausal Last PAP Result NEGATIVE FOR INTRAEPITHELIAL LESIONS OR MALIGNANCY.        Medication Adherence: 11/14/2009   Adherence to medications reviewed with patient. Counseling to provide adequate adherence provided   Prevention For Positives: 11/14/2009   Safe sex practices discussed with patient. Condoms offered.                             Physical Exam  General:  well-developed, well-nourished, and well-hydrated.   Eyes:  pupils equal, pupils round, and pupils reactive to light.   Mouth:  pharynx pink and moist and no exudates.   Neck:  no masses.   Lungs:  normal respiratory effort and normal breath sounds.   Heart:  normal rate, regular rhythm, and no murmur.   Abdomen:  soft, non-tender, and normal bowel sounds.   Msk:  no shoulder tenderness. FROM.  no pain in L olecranon.  nl light touch of fingers.  wrist non-tender.  neck non-tender.  palpation at none of these positions reproduces her symptoms.    Impression & Recommendations:  Problem # 1:  HIV DISEASE (ICD-042) doing well, swears adherence. will recheck her labs. offered condoms.  Her updated medication list for this problem includes:    Bactrim Ds 800-160 Mg Tabs (Sulfamethoxazole-trimethoprim) .Marland Kitchen... 1 tab po mwf    Diflucan 100 Mg Tabs (Fluconazole) .Marland Kitchen... Take 1 tablet by mouth once a day  Problem # 2:  PARESTHESIA (ICD-782.0) I suspect this is related to her sleeping pattern as it is worse in the AM. have asked her to change her sleep position to see  if this helps. certainly could be nerve entrapment/impingement given her hx of shoulder pain. niether her shoulder or elbow reproduse this pain however.   Other Orders: T-CD4SP [facility]) (CD4SP) T-HIV Viral Load [phone]) T-Comprehensive Metabolic Panel [phone]) T-Lipid Profile [phone]) T-CBC w/Diff [phone]) T-RPR (Syphilis) ([id number]) Est. Patient Level IV (02725)  Prescriptions: DIFLUCAN 100 MG TABS (FLUCONAZOLE) Take 1 tablet by mouth once a day  #14 x 1   Entered and Authorized by:   Johny Sax MD   Signed by:   Johny Sax MD on 11/14/2009   Method used:   Print then Give to Patient   RxID:   [id number]  Process Orders Check Orders Results:     [facility]: Order  checked:     22930 -- T-Lipid Profile -- ABN required due to diagnosis (CPT: [phone]) Tests Sent for requisitioning (November 14, 2009 3:51 PM):     11/14/2009: [facility] -- T-HIV Viral Load [phone] (signed)     11/14/2009: [facility] -- T-Comprehensive Metabolic Panel [[id number]] (signed)     11/14/2009: [facility] -- T-Lipid Profile [phone] (signed)     11/14/2009: [facility] -- T-CBC w/Diff [[id number]] (signed)     11/14/2009: [facility] -- T-RPR (Syphilis) [phone] (signed)

## 2010-12-11 NOTE — Miscellaneous (Signed)
Summary: Bridge Counselor  Clinical Lists Changes Pt called BC yesterday to report that [facility] delivered her medications. Pt called out each medication to [facility] over the phone and pt has all medications, including the tablet form of her Norvir. BC will make another homevisit next week to watch pt fill her pillbox again.

## 2010-12-15 ENCOUNTER — Encounter: Payer: Self-pay | Admitting: Infectious Diseases

## 2010-12-15 ENCOUNTER — Other Ambulatory Visit (INDEPENDENT_AMBULATORY_CARE_PROVIDER_SITE_OTHER): Payer: Medicare Other

## 2010-12-15 ENCOUNTER — Other Ambulatory Visit: Payer: Self-pay | Admitting: Infectious Diseases

## 2010-12-15 DIAGNOSIS — B20 Human immunodeficiency virus [HIV] disease: Secondary | ICD-10-CM

## 2010-12-15 LAB — CONVERTED CEMR LAB
ALT: 13 units/L (ref 0–35)
AST: 23 units/L (ref 0–37)
Albumin: 4.2 g/dL (ref 3.5–5.2)
Alkaline Phosphatase: 73 units/L (ref 39–117)
Basophils Absolute: 0 10*3/uL (ref 0.0–0.1)
Basophils Relative: 1 % (ref 0–1)
Calcium: 9.3 mg/dL (ref 8.4–10.5)
Chloride: 97 meq/L (ref 96–112)
HIV 1 RNA Quant: <20 copies/mL (ref <20)
Lymphocytes Relative: 40 % (ref 12–46)
MCHC: 32.5 g/dL (ref 30.0–36.0)
Monocytes Absolute: 0.4 10*3/uL (ref 0.1–1.0)
Neutro Abs: 1.2 10*3/uL — ABNORMAL LOW (ref 1.7–7.7)
Platelets: 242 10*3/uL (ref 150–400)
Potassium: 3.1 meq/L — ABNORMAL LOW (ref 3.5–5.3)
RDW: 14.7 % (ref 11.5–15.5)
Sodium: 138 meq/L (ref 135–145)
Total Protein: 8.7 g/dL — ABNORMAL HIGH (ref 6.0–8.3)

## 2010-12-16 LAB — T-HELPER CELL (CD4) - (RCID CLINIC ONLY)
CD4 % Helper T Cell: 8 % — ABNORMAL LOW (ref 33–55)
CD4 T Cell Abs: 90 uL — ABNORMAL LOW (ref 400–2700)

## 2010-12-31 ENCOUNTER — Ambulatory Visit (INDEPENDENT_AMBULATORY_CARE_PROVIDER_SITE_OTHER): Payer: Medicaid Other | Admitting: Infectious Diseases

## 2010-12-31 ENCOUNTER — Encounter: Payer: Self-pay | Admitting: Infectious Diseases

## 2010-12-31 DIAGNOSIS — Z23 Encounter for immunization: Secondary | ICD-10-CM

## 2010-12-31 DIAGNOSIS — B20 Human immunodeficiency virus [HIV] disease: Secondary | ICD-10-CM

## 2011-01-06 NOTE — Assessment & Plan Note (Signed)
Summary: F/U   Vital Signs:  Patient profile:   45 year old female Menstrual status:  hysterectomy due to fibroids, 1985 Height:      62 inches (157.48 cm) Weight:      136.12 pounds (61.87 kg) BMI:     24.99 Temp:     98.1 degrees F (36.72 degrees C) oral Pulse rate:   93 / minute BP sitting:   111 / 74  (right arm)  Vitals Entered By: Wendall Mola CMA Duncan Dull) (December 31, 2010 10:32 AM) CC: follow-up visit, lab results, pt. c/o right foot pain Is Patient Diabetic? No Pain Assessment Patient in pain? no      Nutritional Status BMI of 19 -24 = normal Nutritional Status Detail appetite "fine"  Have you ever been in a relationship where you felt threatened, hurt or afraid?No   Does patient need assistance? Functional Status Self care Ambulation Normal Comments no missed doses of meds per pt.   CC:  follow-up visit, lab results, and pt. c/o right foot pain.  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 Last CD4 90 and VL <20 (Jan 2011).  Last Genotype naive.  Prev abn Genotypes (see PMHx).   At a prev visit complained of numbness in her arm and tingling. she was sent for MRI and had significant cord compression. On November 30, 2009 she underwent  anterior cervical decompression, C4-5, C5-6.  Arthrodesis C4-C5, 7-mm structural allograft and C5-C6, 6-mm structural allograft.  Anterior instrumentation 30-mm Synthes Vectra plate with 14 mm screws. Was d/c home on 12-02-09.   Has been seen by bridge counselor.  Today complains of pain in the arch of her R foot. Not sure that her shoes have enough arch support. Has pain mostly in the AM, describes pain as aching. No numbness in her foot. Sometimes has pain with wt bearing.   Preventive Screening-Counseling & Management  Alcohol-Tobacco     Alcohol drinks/day: 0     Smoking Status: never  Caffeine-Diet-Exercise     Caffeine use/day: tea occassionally     Does Patient Exercise: yes    Type of exercise: walking when weather permits     Exercise (avg: min/session): <30     Times/week: <3  Hep-HIV-STD-Contraception     HIV Risk: risk noted     HIV Risk Counseling: not indicated-no HIV risk noted  Safety-Violence-Falls     Seat Belt Use: yes      Sexual History:  currently monogamous.        Drug Use:  never.    Comments: pt. declined condoms  Current Medications (verified): 1)  Truvada 200-300 Mg Tabs (Emtricitabine-Tenofovir) .... Take 1 Tablet By Mouth Once A Day 2)  Isentress 400 Mg Tabs (Raltegravir Potassium) .Marland Kitchen.. 1 Tab Two Times A Day 3)  Bactrim Ds 800-160 Mg Tabs (Sulfamethoxazole-Trimethoprim) .Marland Kitchen.. 1 Tab Po Mwf 4)  Hydrochlorothiazide 25 Mg Tabs (Hydrochlorothiazide) .... Take One (1) By Mouth Daily 5)  Prezista 400 Mg Tabs (Darunavir Ethanolate) .... 2 Tab By Mouth Once Daily 6)  Diflucan 100 Mg Tabs (Fluconazole) .... Take 1 Tablet By Mouth Once A Day 7)  Norvir 100 Mg Tabs (Ritonavir) .Marland Kitchen.. 1 Once Daily  Allergies (verified): No Known Drug Allergies  Past History:  Past medical, surgical, family and social histories (including risk factors) reviewed, and no changes noted (except as noted below).  Past Medical History: Reviewed history from 04/23/2010 and no changes required. 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       Hysterectomy due to fibroids Hypertension Spinal Cord Compression On November 30, 2009, anterior cervical decompression, C4-5, C5-6.  Arthrodesis C4-C5, 7-mm structural allograft and C5-C6, 6-mm structural allograft.  Anterior instrumentation 30-mm Synthes Vectra plate with 14 mm screws.  Past Surgical History: Reviewed history from 08/18/2006 and no changes required. Hysterectomy  Family History: Reviewed history from  05/02/2008 and no changes required. Family History Diabetes 1st degree relative  Social History: Reviewed history from 05/02/2008 and no changes required. Never Smoked Alcohol use-no Drug use-no Sexual History:  currently monogamous  Review of Systems       PNVX today, wt down 3#.   Physical Exam  General:  well-developed, well-nourished, and well-hydrated.   Eyes:  pupils equal, pupils round, and pupils reactive to light.   Mouth:  pharynx pink and moist and no exudates.   Neck:  no masses.   Lungs:  normal respiratory effort and normal breath sounds.   Heart:  normal rate, regular rhythm, and no murmur.   Abdomen:  soft, non-tender, and normal bowel sounds.   Extremities:  R foot- no mass or abn palpated. no tenderness illicited.    Impression & Recommendations:  Problem # 1:  HIV DISEASE (ICD-042) she is doing ok. her CD4 remains low. she is engaged. she needs RPR and lipids at next visit. offered condoms. return to clinic 3-4 months.   Her updated medication list for this problem includes:    Bactrim Ds 800-160 Mg Tabs (Sulfamethoxazole-trimethoprim) .Marland Kitchen... 1 tab po mwf    Diflucan 100 Mg Tabs (Fluconazole) .Marland Kitchen... Take 1 tablet by mouth once a day  Problem # 2:  ASCUS PAP (ICD-795.01) had abn with [facility] 09-2010. pt will call for repeat appt.   Problem # 3:  SPINAL CORD COMPRESSION (ICD-336.9) do not believe her foot symptoms are due to this. will cont to watch.   Other Orders: Pneumococcal Vaccine (16109) Admin 1st Vaccine (60454) Est. Patient Level IV (09811) Future Orders: T-CD4SP ([facility]) (CD4SP) ... 03/31/2011 T-HIV Viral Load [phone]) ... 03/31/2011 T-Comprehensive Metabolic Panel [phone]) ... 03/31/2011 T-CBC w/Diff ([id number]) ... 03/31/2011 T-RPR (Syphilis) [phone]) ... 03/31/2011 T-Lipid Profile [id number]) ... 03/31/2011    Orders Added: 1)  Pneumococcal Vaccine [90732] 2)  Admin 1st Vaccine [90471] 3)  T-CD4SP [facility])  [CD4SP] 4)  T-HIV Viral Load [phone] 5)  T-Comprehensive Metabolic Panel [80053-22900] 6)  T-CBC w/Diff [[id number]] 7)  T-RPR (Syphilis) [16606-30160] 8)  T-Lipid Profile [80061-22930] 9)  Est. Patient Level IV [10932]   Immunizations Administered:  Pneumonia Vaccine:    Vaccine Type: Pneumovax    Site: right deltoid    Mfr: Merck    Dose: 0.5 ml    Route: IM    Given by: Wendall Mola CMA ( AAMA)    Exp. Date: 06/04/2012    Lot #: [id number]    VIS given: 10/14/09 version given December 31, 2010.   Immunizations Administered:  Pneumonia Vaccine:    Vaccine Type: Pneumovax    Site: right deltoid    Mfr: Merck    Dose: 0.5 ml    Route: IM    Given by: Wendall Mola CMA ( AAMA)    Exp. Date: 06/04/2012    Lot #:  [id number]    VIS given: 10/14/09 version given December 31, 2010.        Medication Adherence: 12/31/2010   Adherence to medications reviewed with patient. Counseling to provide adequate adherence provided   Prevention For Positives: 12/31/2010   Safe sex practices discussed with patient. Condoms offered.

## 2011-01-15 ENCOUNTER — Encounter (INDEPENDENT_AMBULATORY_CARE_PROVIDER_SITE_OTHER): Payer: Self-pay | Admitting: *Deleted

## 2011-01-20 LAB — T-HELPER CELL (CD4) - (RCID CLINIC ONLY)
CD4 % Helper T Cell: 7 % — ABNORMAL LOW (ref 33–55)
CD4 T Cell Abs: 90 uL — ABNORMAL LOW (ref 400–2700)

## 2011-01-20 NOTE — Miscellaneous (Signed)
Summary: Bridge Counselor  Clinical Lists Changes [facility] closed pt'd file for bridge counseling today. Pt is doing well and has an undetectable viral load. Pt declined additional case management services at present but will call [facility] if needs arise in the future.

## 2011-01-24 LAB — T-HELPER CELL (CD4) - (RCID CLINIC ONLY): CD4 % Helper T Cell: 6 % — ABNORMAL LOW (ref 33–55)

## 2011-01-25 LAB — BASIC METABOLIC PANEL
CO2: 22 mEq/L (ref 19–32)
Calcium: 8.8 mg/dL (ref 8.4–10.5)
Chloride: 109 mEq/L (ref 96–112)
Creatinine, Ser: 0.79 mg/dL (ref 0.4–1.2)
GFR calc Af Amer: >60 mL/min (ref >60)
Sodium: 139 mEq/L (ref 135–145)

## 2011-01-25 LAB — CBC
HCT: 26.9 % — ABNORMAL LOW (ref 36.0–46.0)
HCT: 32.6 % — ABNORMAL LOW (ref 36.0–46.0)
Hemoglobin: 11.4 g/dL — ABNORMAL LOW (ref 12.0–15.0)
Hemoglobin: 9.5 g/dL — ABNORMAL LOW (ref 12.0–15.0)
MCHC: 34.8 g/dL (ref 30.0–36.0)
MCHC: 35.1 g/dL (ref 30.0–36.0)
MCV: 90.3 fL (ref 78.0–100.0)
MCV: 91.5 fL (ref 78.0–100.0)
Platelets: 213 K/uL (ref 150–400)
Platelets: 239 K/uL (ref 150–400)
RBC: 2.94 MIL/uL — ABNORMAL LOW (ref 3.87–5.11)
RBC: 3.61 MIL/uL — ABNORMAL LOW (ref 3.87–5.11)
RDW: 13.3 % (ref 11.5–15.5)
RDW: 13.8 % (ref 11.5–15.5)
WBC: 3.5 K/uL — ABNORMAL LOW (ref 4.0–10.5)
WBC: 5.9 K/uL (ref 4.0–10.5)

## 2011-01-25 LAB — COMPREHENSIVE METABOLIC PANEL WITH GFR
ALT: 21 U/L (ref 0–35)
AST: 32 U/L (ref 0–37)
Albumin: 4.2 g/dL (ref 3.5–5.2)
Alkaline Phosphatase: 67 U/L (ref 39–117)
BUN: 7 mg/dL (ref 6–23)
CO2: 26 meq/L (ref 19–32)
Calcium: 9.7 mg/dL (ref 8.4–10.5)
Chloride: 104 meq/L (ref 96–112)
Creatinine, Ser: 0.81 mg/dL (ref 0.4–1.2)
GFR calc non Af Amer: >60 mL/min
Glucose, Bld: 115 mg/dL — ABNORMAL HIGH (ref 70–99)
Potassium: 2.7 meq/L — CL (ref 3.5–5.1)
Sodium: 139 meq/L (ref 135–145)
Total Bilirubin: 0.6 mg/dL (ref 0.3–1.2)
Total Protein: 9.2 g/dL — ABNORMAL HIGH (ref 6.0–8.3)

## 2011-01-25 LAB — BASIC METABOLIC PANEL WITH GFR
BUN: 7 mg/dL (ref 6–23)
CO2: 23 meq/L (ref 19–32)
Calcium: 9 mg/dL (ref 8.4–10.5)
Chloride: 106 meq/L (ref 96–112)
Creatinine, Ser: 0.72 mg/dL (ref 0.4–1.2)
GFR calc non Af Amer: >60 mL/min
Glucose, Bld: 148 mg/dL — ABNORMAL HIGH (ref 70–99)
Potassium: 4.5 meq/L (ref 3.5–5.1)
Sodium: 136 meq/L (ref 135–145)

## 2011-01-25 LAB — APTT: aPTT: 31 s (ref 24–37)

## 2011-01-25 LAB — PROTIME-INR
INR: 1.02 (ref 0.00–1.49)
Prothrombin Time: 13.3 s (ref 11.6–15.2)

## 2011-01-25 LAB — MAGNESIUM: Magnesium: 1.6 mg/dL (ref 1.5–2.5)

## 2011-01-25 LAB — HIV-1 RNA ULTRAQUANT REFLEX TO GENTYP+
HIV 1 RNA Quant: 749 copies/mL — ABNORMAL HIGH (ref <48)
HIV-1 RNA Quant, Log: 2.87 {Log} — ABNORMAL HIGH (ref <1.68)

## 2011-01-27 LAB — T-HELPER CELL (CD4) - (RCID CLINIC ONLY)
CD4 % Helper T Cell: 6 % — ABNORMAL LOW (ref 33–55)
CD4 T Cell Abs: 90 uL — ABNORMAL LOW (ref 400–2700)

## 2011-02-11 LAB — T-HELPER CELL (CD4) - (RCID CLINIC ONLY)
CD4 % Helper T Cell: 5 % — ABNORMAL LOW (ref 33–55)
CD4 T Cell Abs: 60 uL — ABNORMAL LOW (ref 400–2700)

## 2011-02-17 LAB — T-HELPER CELL (CD4) - (RCID CLINIC ONLY): CD4 T Cell Abs: 120 uL — ABNORMAL LOW (ref 400–2700)

## 2011-02-18 LAB — T-HELPER CELL (CD4) - (RCID CLINIC ONLY): CD4 % Helper T Cell: 7 % — ABNORMAL LOW (ref 33–55)

## 2011-02-26 ENCOUNTER — Ambulatory Visit: Payer: Medicare Other | Admitting: Obstetrics & Gynecology

## 2011-03-16 ENCOUNTER — Ambulatory Visit: Payer: Medicare Other | Admitting: Obstetrics and Gynecology

## 2011-03-18 ENCOUNTER — Other Ambulatory Visit: Payer: Medicare Other

## 2011-03-27 NOTE — Op Note (Signed)
[facility]  Patient:    Cathy Smith, Cathy Smith                     MRN: [id number] Proc. Date: 04/28/01 Adm. Date:  [id number] Attending:  Janalyn Rouse                           Operative Report  PREOPERATIVE DIAGNOSIS:  Left buttock abscess.  POSTOPERATIVE DIAGNOSIS:  Left buttock abscess.  OPERATION PERFORMED:  Incision and drainage of left buttock abscess.  SURGEON:  Rose Phi. Maple Hudson, M.D.  ANESTHESIA:  General.  INDICATIONS FOR PROCEDURE:  This 45 year old African-American female who is positive HIV, presented to the office yesterday with a moderate-sized buttock abscess for which we have no known etiology.  It was too tender to try to do anything about it in the office so we are doing her today.  DESCRIPTION OF PROCEDURE:  After suitable anesthesia was induced, the patient was placed in the prone position and the left buttock prepped and draped in the usual fashion.  A transverse incision was made and the abscess opened and drained.  It was cultured both aerobically and anaerobically.  We then thoroughly irrigated it with saline.  One inch iodoform packing was inserted and a dry dressing applied.  She was then transferred to the recovery room in satisfactory condition having tolerated the procedure well. DD:  04/28/01 TD:  04/28/01 Job: [id number]

## 2011-03-30 ENCOUNTER — Encounter: Payer: Self-pay | Admitting: Infectious Diseases

## 2011-03-30 ENCOUNTER — Ambulatory Visit (INDEPENDENT_AMBULATORY_CARE_PROVIDER_SITE_OTHER): Payer: Medicare Other | Admitting: Infectious Diseases

## 2011-03-30 DIAGNOSIS — Z79899 Other long term (current) drug therapy: Secondary | ICD-10-CM

## 2011-03-30 DIAGNOSIS — B20 Human immunodeficiency virus [HIV] disease: Secondary | ICD-10-CM

## 2011-03-30 DIAGNOSIS — R87610 Atypical squamous cells of undetermined significance on cytologic smear of cervix (ASC-US): Secondary | ICD-10-CM

## 2011-03-30 DIAGNOSIS — G959 Disease of spinal cord, unspecified: Secondary | ICD-10-CM

## 2011-03-30 DIAGNOSIS — Z113 Encounter for screening for infections with a predominantly sexual mode of transmission: Secondary | ICD-10-CM

## 2011-03-30 LAB — LIPID PANEL
Cholesterol: 125 mg/dL (ref 0–200)
VLDL: 42 mg/dL — ABNORMAL HIGH (ref 0–40)

## 2011-03-30 LAB — COMPREHENSIVE METABOLIC PANEL
ALT: 14 U/L (ref 0–35)
Albumin: 4.1 g/dL (ref 3.5–5.2)
CO2: 24 mEq/L (ref 19–32)
Chloride: 104 mEq/L (ref 96–112)
Glucose, Bld: 83 mg/dL (ref 70–99)
Potassium: 3.6 mEq/L (ref 3.5–5.3)
Sodium: 138 mEq/L (ref 135–145)
Total Bilirubin: 0.3 mg/dL (ref 0.3–1.2)
Total Protein: 8.3 g/dL (ref 6.0–8.3)

## 2011-03-30 LAB — CBC
HCT: 33.2 % — ABNORMAL LOW (ref 36.0–46.0)
Hemoglobin: 10.8 g/dL — ABNORMAL LOW (ref 12.0–15.0)
MCH: 30.6 pg (ref 26.0–34.0)
RBC: 3.53 MIL/uL — ABNORMAL LOW (ref 3.87–5.11)

## 2011-03-30 LAB — RPR

## 2011-03-30 NOTE — Assessment & Plan Note (Signed)
She's doing well, has minimal symptoms from this.

## 2011-03-30 NOTE — Assessment & Plan Note (Signed)
She appears to be doing well clinically. Will check her labs today including RPR and lipids. Will see her back in 4 months. She is offered condoms. Denies missed meds.

## 2011-03-30 NOTE — Progress Notes (Signed)
  Subjective:    Patient ID: Cathy Smith, female    DOB: 04/07/1966, 45 y.o.   MRN: [id number]  HPI 43 yoF with hx of HIV+ on ISS/DRVr/TRV.  Last CD4 90 and VL <20 (Feb 2012). Last Genotype naive.  Prev abn Genotypes.  On November 30, 2009 she underwent anterior cervical  decompression, C4-5, C5-6. Arthrodesis C4-C5, 7-mm structural allograft  and C5-C6, 6-mm structural allograft. Anterior instrumentation 30-mm  Synthes Vectra plate with 14 mm screws. Was d/c home on 12-02-09. Has had [facility]x done last fall at [facility]YN, has not had f/u appt since.  Having trouble taking bactrim.   Review of Systems  Constitutional: Negative for appetite change and unexpected weight change.  Gastrointestinal: Negative for diarrhea and constipation.  Genitourinary: Negative for difficulty urinating.       Objective:   Physical Exam  Constitutional: She appears well-developed and well-nourished.  Eyes: EOM are normal. Pupils are equal, round, and reactive to light.  Cardiovascular: Normal rate, regular rhythm and normal heart sounds.   Pulmonary/Chest: Effort normal and breath sounds normal.  Abdominal: Soft. Bowel sounds are normal. She exhibits no distension.  Musculoskeletal: She exhibits no edema.          Assessment & Plan:

## 2011-03-30 NOTE — Assessment & Plan Note (Signed)
She will f/u with GYN, will make appt for her.

## 2011-03-31 ENCOUNTER — Telehealth: Payer: Self-pay | Admitting: *Deleted

## 2011-03-31 ENCOUNTER — Ambulatory Visit: Payer: Medicare Other | Admitting: Infectious Diseases

## 2011-03-31 LAB — T-HELPER CELL (CD4) - (RCID CLINIC ONLY)
CD4 % Helper T Cell: 9 % — ABNORMAL LOW (ref 33–55)
CD4 T Cell Abs: 130 uL — ABNORMAL LOW (ref 400–2700)

## 2011-03-31 NOTE — Telephone Encounter (Signed)
Patient scheduled appt with [facility] for PAP on 04/20/11 @ 2:15 pm.  Patient notified. Wendall Mola CMA

## 2011-04-01 LAB — HIV-1 RNA ULTRAQUANT REFLEX TO GENTYP+: HIV 1 RNA Quant: <20 copies/mL (ref <20)

## 2011-04-08 ENCOUNTER — Other Ambulatory Visit: Payer: Self-pay | Admitting: *Deleted

## 2011-04-08 NOTE — Telephone Encounter (Signed)
Note from pharmacy re:  Fluconazole.  The patient had told them that she was no longer on the medication.  After checking the last OV note there was no change in this rx.  The pt should continue taking the medication.   Jennet Maduro, RN

## 2011-04-20 ENCOUNTER — Ambulatory Visit: Payer: Medicare Other | Admitting: Family Medicine

## 2011-04-20 ENCOUNTER — Other Ambulatory Visit: Payer: Self-pay | Admitting: Family Medicine

## 2011-04-20 DIAGNOSIS — B3731 Acute candidiasis of vulva and vagina: Secondary | ICD-10-CM

## 2011-04-20 DIAGNOSIS — B373 Candidiasis of vulva and vagina: Secondary | ICD-10-CM

## 2011-04-20 DIAGNOSIS — Z0142 Encounter for cervical smear to confirm findings of recent normal smear following initial abnormal smear: Secondary | ICD-10-CM

## 2011-04-21 NOTE — Group Therapy Note (Signed)
NAMEKARINGTON, Cathy Smith              ACCOUNT NO.:  [id number]  MEDICAL RECORD NO.:  [id number]           PATIENT TYPE:  A  LOCATION:  [facility]                   FACILITY:  [facility]  PHYSICIAN:  Maryelizabeth Kaufmann, MD  DATE OF BIRTH:  27-Jul-1966  DATE OF SERVICE:  04/20/2011                                 CLINIC NOTE  CHIEF COMPLAINT:  Six-month followup, repeat PAP, and the patient is to check on fibroids.  HISTORY OF PRESENT ILLNESS:  This is a 45 year old G1, P0-0-1-0 with history of a hysterectomy presenting for followup of her 65-month repeat Pap smear.  She had a history of ASCUS on a vaginal cuff Pap.  She underwent colposcopy with biopsies and returned with LSIL and thus she is presenting for a repeat Pap smear.  Her only complaint today was for possible abdominal bloating.  Again, I discussed with the patient that she has had a hysterectomy from her fibroids, so there are no further fibroids and so that requires no further evaluation.  PHYSICAL EXAMINATION:  VITAL SIGNS:  Blood pressure 125/85, pulse 89, temperature 98.8, weight 139.1, height 61 inches. GENERAL:  The patient is in no acute distress.  Alert and oriented x4. CARDIOVASCULAR:  Regular rate and rhythm.  No murmur, rubs, or gallops. CHEST:  Clear to auscultation bilaterally.  No wheezes, rales, or rhonchi. ABDOMEN:  Positive bowel sounds.  Slightly distended, but no tympani. GU:  Normal external genitalia.  Normal vaginal mucosa.  Vaginal cuff was visualized, was sampled.  No visual lesions were seen.  Bimanual exam was performed, unable to palpate bilateral adnexa, but otherwise without any masses.  External genitalia did have some possible candidal areas on the labia minora and did have a white discharge and a wet prep was done.  ASSESSMENT AND PLAN:  This is a 45 year old G1, P0-0-1-0 with history of abnormal Pap smear of her vaginal cuff presenting for followup of her Pap smear. 1. AbNormal Pap smear s/p  repeat pap.  She will return to clinic in 6 months to follow that up. 2. Possible yeast vaginitis.  The patient did have a white discharge     in addition to possible candidal yeast in the labia minora which     appear to be possible violaceous and ruddy, so I gave the patient     one dose of Diflucan.  The patient was instructed to follow up     sooner in 6 months if she has any further complications in that     regard.          ______________________________ Maryelizabeth Kaufmann, MD    LC/MEDQ  D:  04/20/2011  T:  04/21/2011  Job:  [id number]

## 2011-04-23 ENCOUNTER — Ambulatory Visit: Payer: Medicare Other | Admitting: Obstetrics and Gynecology

## 2011-06-05 ENCOUNTER — Ambulatory Visit: Payer: Medicare Other | Admitting: Adult Health

## 2011-06-11 ENCOUNTER — Ambulatory Visit: Payer: Medicare Other | Admitting: Adult Health

## 2011-08-05 LAB — T-HELPER CELL (CD4) - (RCID CLINIC ONLY)
CD4 % Helper T Cell: 4 — ABNORMAL LOW
CD4 T Cell Abs: 60 — ABNORMAL LOW

## 2011-08-07 ENCOUNTER — Other Ambulatory Visit: Payer: Self-pay | Admitting: Infectious Diseases

## 2011-08-10 LAB — T-HELPER CELL (CD4) - (RCID CLINIC ONLY): CD4 % Helper T Cell: 5 — ABNORMAL LOW

## 2011-08-12 ENCOUNTER — Other Ambulatory Visit (INDEPENDENT_AMBULATORY_CARE_PROVIDER_SITE_OTHER): Payer: Medicare Other

## 2011-08-12 DIAGNOSIS — I10 Essential (primary) hypertension: Secondary | ICD-10-CM

## 2011-08-12 DIAGNOSIS — B20 Human immunodeficiency virus [HIV] disease: Secondary | ICD-10-CM

## 2011-08-12 LAB — CBC WITH DIFFERENTIAL/PLATELET
Basophils Absolute: 0 10*3/uL (ref 0.0–0.1)
HCT: 36.8 % (ref 36.0–46.0)
Lymphocytes Relative: 25 % (ref 12–46)
Neutro Abs: 4.2 10*3/uL (ref 1.7–7.7)
Neutrophils Relative %: 65 % (ref 43–77)
Platelets: 191 10*3/uL (ref 150–400)
RDW: 12.8 % (ref 11.5–15.5)
WBC: 6.4 10*3/uL (ref 4.0–10.5)

## 2011-08-12 LAB — COMPREHENSIVE METABOLIC PANEL
ALT: 16 U/L (ref 0–35)
AST: 18 U/L (ref 0–37)
Albumin: 4.7 g/dL (ref 3.5–5.2)
Calcium: 9.3 mg/dL (ref 8.4–10.5)
Chloride: 100 mEq/L (ref 96–112)
Potassium: 4.3 mEq/L (ref 3.5–5.3)
Sodium: 138 mEq/L (ref 135–145)

## 2011-08-13 LAB — T-HELPER CELL (CD4) - (RCID CLINIC ONLY): CD4 % Helper T Cell: 7 % — ABNORMAL LOW (ref 33–55)

## 2011-08-20 LAB — T-HELPER CELL (CD4) - (RCID CLINIC ONLY)
CD4 % Helper T Cell: 5 — ABNORMAL LOW
CD4 T Cell Abs: 70 — ABNORMAL LOW

## 2011-08-21 ENCOUNTER — Other Ambulatory Visit: Payer: Self-pay | Admitting: Infectious Diseases

## 2011-08-21 DIAGNOSIS — Z1231 Encounter for screening mammogram for malignant neoplasm of breast: Secondary | ICD-10-CM

## 2011-08-26 ENCOUNTER — Ambulatory Visit: Payer: Medicare Other | Admitting: Infectious Diseases

## 2011-09-01 ENCOUNTER — Other Ambulatory Visit: Payer: Self-pay | Admitting: *Deleted

## 2011-09-01 DIAGNOSIS — B20 Human immunodeficiency virus [HIV] disease: Secondary | ICD-10-CM

## 2011-09-01 MED ORDER — EMTRICITABINE-TENOFOVIR DF 200-300 MG PO TABS: 1.0000 | ORAL_TABLET | Freq: Every day | ORAL | Status: DC

## 2011-09-02 ENCOUNTER — Ambulatory Visit (INDEPENDENT_AMBULATORY_CARE_PROVIDER_SITE_OTHER): Payer: Medicare Other | Admitting: Infectious Diseases

## 2011-09-02 ENCOUNTER — Encounter: Payer: Self-pay | Admitting: Infectious Diseases

## 2011-09-02 VITALS — BP 129/87 | HR 80 | Temp 98.2°F | Ht 62.0 in | Wt 145.0 lb

## 2011-09-02 DIAGNOSIS — G959 Disease of spinal cord, unspecified: Secondary | ICD-10-CM

## 2011-09-02 DIAGNOSIS — Z23 Encounter for immunization: Secondary | ICD-10-CM

## 2011-09-02 DIAGNOSIS — B20 Human immunodeficiency virus [HIV] disease: Secondary | ICD-10-CM

## 2011-09-02 DIAGNOSIS — Z113 Encounter for screening for infections with a predominantly sexual mode of transmission: Secondary | ICD-10-CM

## 2011-09-02 DIAGNOSIS — R87610 Atypical squamous cells of undetermined significance on cytologic smear of cervix (ASC-US): Secondary | ICD-10-CM

## 2011-09-02 DIAGNOSIS — Z79899 Other long term (current) drug therapy: Secondary | ICD-10-CM

## 2011-09-02 NOTE — Assessment & Plan Note (Signed)
She appears to be doing well.

## 2011-09-02 NOTE — Assessment & Plan Note (Signed)
She is doing well. States her husband is aware of her status. Will ask if he needs to be tested. She will rtc in 5 months with labs prior. vax uptodate.

## 2011-09-02 NOTE — Progress Notes (Signed)
  Subjective:    Patient ID: Cathy Smith, female    DOB: 1966-05-20, 45 y.o.   MRN: [id number]  HPI 45 yo F with hx of HIV+ on ISS/DRVr/TRV.  Last CD4 180 and VL <20 (08-12-2011). Last Genotype naive.  Prev abn Genotypes. On November 30, 2009 she underwent anterior cervical  decompression, C4-5, C5-6. Arthrodesis C4-C5, 7-mm structural allograft  and C5-C6, 6-mm structural allograft. Anterior instrumentation 30-mm  Synthes Vectra plate with 14 mm screws. Was d/c home on 12-02-09. Has had [facility]x done last fall at GYN, had f/u appt made at last appt, has not followed up. States she has follow up in December. Has mammo scheduled next month.  Has been coughing, mostly at night for the last month. Has been intermittent. No fever or chills. Neck feels bette.     Review of Systems  Constitutional: Negative for fever, chills, appetite change and unexpected weight change.  Respiratory: Positive for cough. Negative for shortness of breath.   Gastrointestinal: Negative for diarrhea and constipation.  Genitourinary: Negative for dysuria.       Objective:   Physical Exam  Constitutional: She appears well-developed and well-nourished.  Eyes: EOM are normal. Pupils are equal, round, and reactive to light.  Neck: Neck supple.  Cardiovascular: Normal rate, regular rhythm and normal heart sounds.   Pulmonary/Chest: Effort normal and breath sounds normal.  Abdominal: Soft. Bowel sounds are normal. There is no tenderness.  Lymphadenopathy:    She has no cervical adenopathy.          Assessment & Plan:

## 2011-09-02 NOTE — Assessment & Plan Note (Signed)
She will f/u with GYN in December.

## 2011-09-07 ENCOUNTER — Ambulatory Visit: Payer: Medicare Other | Admitting: Infectious Diseases

## 2011-09-11 ENCOUNTER — Ambulatory Visit: Payer: Medicare Other

## 2011-09-15 ENCOUNTER — Telehealth: Payer: Self-pay | Admitting: Licensed Clinical Social Worker

## 2011-09-15 NOTE — Telephone Encounter (Signed)
Patient called stating that she had a bad cough and needed to know what cough medicine she could use. I suggested mucinex or store brand version of mucinex. I advised the patient to call back if the symptoms worsen or persist.

## 2011-09-29 ENCOUNTER — Ambulatory Visit (HOSPITAL_COMMUNITY): Payer: Medicare Other

## 2011-10-14 ENCOUNTER — Ambulatory Visit: Payer: Medicare Other | Admitting: Internal Medicine

## 2011-10-14 ENCOUNTER — Telehealth: Payer: Self-pay | Admitting: *Deleted

## 2011-10-14 ENCOUNTER — Ambulatory Visit (HOSPITAL_COMMUNITY): Payer: Medicare Other

## 2011-10-14 NOTE — Telephone Encounter (Signed)
Patient called c/o cough and congestion prior to last office visit in October.  Has tried OTC cough medicine without improvement.  Patient given appointment to be seen today. Wendall Mola CMA

## 2011-10-22 ENCOUNTER — Ambulatory Visit (INDEPENDENT_AMBULATORY_CARE_PROVIDER_SITE_OTHER): Payer: Medicare Other | Admitting: Family Medicine

## 2011-10-22 ENCOUNTER — Other Ambulatory Visit (HOSPITAL_COMMUNITY)
Admission: RE | Admit: 2011-10-22 | Discharge: 2011-10-22 | Disposition: A | Discharge status: Home / Self Care | Payer: Medicare Other | Source: Ambulatory Visit | Attending: Family Medicine | Admitting: Family Medicine

## 2011-10-22 ENCOUNTER — Encounter: Payer: Self-pay | Admitting: Obstetrics and Gynecology

## 2011-10-22 DIAGNOSIS — R87610 Atypical squamous cells of undetermined significance on cytologic smear of cervix (ASC-US): Secondary | ICD-10-CM

## 2011-10-22 DIAGNOSIS — Z01419 Encounter for gynecological examination (general) (routine) without abnormal findings: Secondary | ICD-10-CM

## 2011-10-22 DIAGNOSIS — R87619 Unspecified abnormal cytological findings in specimens from cervix uteri: Secondary | ICD-10-CM | POA: Insufficient documentation

## 2011-10-22 DIAGNOSIS — Z124 Encounter for screening for malignant neoplasm of cervix: Secondary | ICD-10-CM | POA: Insufficient documentation

## 2011-10-22 DIAGNOSIS — Z113 Encounter for screening for infections with a predominantly sexual mode of transmission: Secondary | ICD-10-CM | POA: Insufficient documentation

## 2011-10-22 NOTE — Progress Notes (Addendum)
  Subjective:     Cathy Smith is a 45 y.o. female and is here for a comprehensive physical exam. The patient reports no problems.  Had hysterectomy approximately 10 years ago.  No menopausal symptoms.  Occasional pelvic cramps.  No vaginal discharge, vaginal bleeding, concerns for genital stds.  Had LGSIL on 6/12    History   Social History  . Marital Status: Single    Spouse Name: N/A    Number of Children: N/A  . Years of Education: N/A   Occupational History  . Not on file.   Social History Main Topics  . Smoking status: Never Smoker   . Smokeless tobacco: Never Used  . Alcohol Use: No  . Drug Use: No  . Sexually Active: Yes -- Female partner(s)    Birth Control/ Protection: Condom     pt. declined condoms   Other Topics Concern  . Not on file   Social History Narrative  . No narrative on file   Health Maintenance  Topic Date Due  . Tetanus/tdap  11/17/1982  . Influenza Vaccine  08/09/2012  . Pap Smear  04/19/2014    The following portions of the patient's history were reviewed and updated as appropriate: allergies, current medications, past family history, past medical history, past social history, past surgical history and problem list.  Review of Systems Pertinent items are noted in HPI.   Objective:    General appearance: alert, cooperative and no distress Head: Normocephalic, without obvious abnormality, atraumatic Neck: no adenopathy, no carotid bruit, no JVD, supple, symmetrical, trachea midline and thyroid not enlarged, symmetric, no tenderness/mass/nodules Lungs: clear to auscultation bilaterally Breasts: normal appearance, no masses or tenderness Heart: regular rate and rhythm, S1, S2 normal, no murmur, click, rub or gallop Abdomen: soft, non-tender; bowel sounds normal; no masses,  no organomegaly Pelvic: external genitalia normal, no adnexal masses or tenderness, rectovaginal septum normal, vagina normal without discharge and Uterus and cervix  absent. Extremities: extremities normal, atraumatic, no cyanosis or edema Pulses: 2+ and symmetric    Assessment:    Healthy female exam.     Plan:     Discussed exercise, osteoporosis prevention. PAP done of vaginal cuff.  Mammogram scheduled for January 2013. No other concerns.

## 2011-10-26 ENCOUNTER — Telehealth: Payer: Self-pay | Admitting: *Deleted

## 2011-10-26 NOTE — Telephone Encounter (Signed)
Poor connection on phone. She wanted to know when her appt was. The call was lost. Her phones have been disconnected. Will wait for her to call back

## 2011-10-27 ENCOUNTER — Telehealth: Payer: Self-pay | Admitting: *Deleted

## 2011-10-27 NOTE — Telephone Encounter (Signed)
Patient notified of need for colpo. Appt made for 1/23 at 1 pm

## 2011-10-27 NOTE — Telephone Encounter (Signed)
Message copied by Mannie Stabile on Tue Oct 27, 2011 10:46 AM ------      Message from: Levie Heritage      Created: Tue Oct 27, 2011 10:28 AM       Please call patient with abnormal results.  Will need vaginal colposcopy.      Thanks, JS

## 2011-11-09 ENCOUNTER — Other Ambulatory Visit: Payer: Medicare Other

## 2011-11-09 ENCOUNTER — Other Ambulatory Visit: Payer: Self-pay | Admitting: Infectious Diseases

## 2011-11-09 DIAGNOSIS — Z113 Encounter for screening for infections with a predominantly sexual mode of transmission: Secondary | ICD-10-CM

## 2011-11-09 DIAGNOSIS — Z79899 Other long term (current) drug therapy: Secondary | ICD-10-CM

## 2011-11-09 DIAGNOSIS — B20 Human immunodeficiency virus [HIV] disease: Secondary | ICD-10-CM

## 2011-11-09 LAB — COMPLETE METABOLIC PANEL WITH GFR
Alkaline Phosphatase: 79 U/L (ref 39–117)
Creat: 0.71 mg/dL (ref 0.50–1.10)
GFR, Est African American: >89 mL/min
GFR, Est Non African American: >89 mL/min
Glucose, Bld: 105 mg/dL — ABNORMAL HIGH (ref 70–99)
Sodium: 137 mEq/L (ref 135–145)
Total Bilirubin: 0.4 mg/dL (ref 0.3–1.2)
Total Protein: 8.3 g/dL (ref 6.0–8.3)

## 2011-11-09 LAB — LIPID PANEL
Cholesterol: 146 mg/dL (ref 0–200)
HDL: 31 mg/dL — ABNORMAL LOW (ref >39)
Total CHOL/HDL Ratio: 4.7 Ratio
Triglycerides: 279 mg/dL — ABNORMAL HIGH (ref <150)

## 2011-11-09 LAB — RPR

## 2011-11-09 LAB — CBC
Hemoglobin: 12.1 g/dL (ref 12.0–15.0)
MCH: 30.7 pg (ref 26.0–34.0)
MCHC: 32.8 g/dL (ref 30.0–36.0)
MCV: 93.7 fL (ref 78.0–100.0)

## 2011-11-09 LAB — T-HELPER CELL (CD4) - (RCID CLINIC ONLY): CD4 % Helper T Cell: 11 % — ABNORMAL LOW (ref 33–55)

## 2011-11-13 LAB — HIV-1 RNA QUANT-NO REFLEX-BLD: HIV 1 RNA Quant: <20 copies/mL (ref <20)

## 2011-11-18 ENCOUNTER — Ambulatory Visit (HOSPITAL_COMMUNITY)
Admission: RE | Admit: 2011-11-18 | Discharge: 2011-11-18 | Disposition: A | Discharge status: Home / Self Care | Payer: Medicare Other | Source: Ambulatory Visit | Attending: Infectious Diseases | Admitting: Infectious Diseases

## 2011-11-18 DIAGNOSIS — Z1231 Encounter for screening mammogram for malignant neoplasm of breast: Secondary | ICD-10-CM | POA: Insufficient documentation

## 2011-11-23 ENCOUNTER — Ambulatory Visit: Payer: Medicare Other | Admitting: Infectious Diseases

## 2011-11-26 ENCOUNTER — Ambulatory Visit (INDEPENDENT_AMBULATORY_CARE_PROVIDER_SITE_OTHER): Payer: Medicare Other | Admitting: Infectious Diseases

## 2011-11-26 ENCOUNTER — Encounter: Payer: Self-pay | Admitting: Infectious Diseases

## 2011-11-26 VITALS — BP 119/75 | HR 93 | Temp 97.7°F | Ht 62.0 in | Wt 158.0 lb

## 2011-11-26 DIAGNOSIS — Z79899 Other long term (current) drug therapy: Secondary | ICD-10-CM | POA: Diagnosis not present

## 2011-11-26 DIAGNOSIS — R87610 Atypical squamous cells of undetermined significance on cytologic smear of cervix (ASC-US): Secondary | ICD-10-CM | POA: Diagnosis not present

## 2011-11-26 DIAGNOSIS — J019 Acute sinusitis, unspecified: Secondary | ICD-10-CM

## 2011-11-26 DIAGNOSIS — B20 Human immunodeficiency virus [HIV] disease: Secondary | ICD-10-CM | POA: Diagnosis not present

## 2011-11-26 DIAGNOSIS — Z113 Encounter for screening for infections with a predominantly sexual mode of transmission: Secondary | ICD-10-CM

## 2011-11-26 MED ORDER — AZITHROMYCIN 500 MG PO TABS: 500.0000 mg | ORAL_TABLET | Freq: Every day | ORAL | Status: AC

## 2011-11-26 NOTE — Assessment & Plan Note (Signed)
She appears to be doing well. She is given condoms. Her vaccines are up to date. Will see her back in 4 months with labs prior.

## 2011-11-26 NOTE — Assessment & Plan Note (Signed)
She has f/u appt with GYN, my great appreciation to them for partnering with [place].

## 2011-11-26 NOTE — Progress Notes (Signed)
  Subjective:    Patient ID: Cathy Smith, female    DOB: 1966-05-13, 46 y.o.   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  Last CD4 160 and VL <20 (11-09-2011). Last Genotype naive.  Prev abn Genotypes. On November 30, 2009 she underwent anterior cervical  decompression, C4-5, C5-6. Arthrodesis C4-C5, 7-mm structural allograft  and C5-C6, 6-mm structural allograft. Anterior instrumentation 30-mm  Synthes Vectra plate with 14 mm screws. Was d/c home on 12-02-09. Has had [facility] done last fall at [facility]YN, had repeat CIN1 (10-22-11). Mammo 11-18-11 NL.  Scheduled for repeat GYN visit this month.  States she caught a cold and it won't go away. Occas cough/sneeze. Has used mucomyst. Has been having congestion in her sinuses. Has had decreased energy for several days. No fevers or chills.     Review of Systems  Constitutional: Negative for fever, chills, appetite change and unexpected weight change.  Gastrointestinal: Negative for constipation and blood in stool.  Genitourinary: Negative for dysuria.       Objective:   Physical Exam  Constitutional: She appears well-developed and well-nourished.  HENT:  Head: Normocephalic.  Nose: Right sinus exhibits no maxillary sinus tenderness and no frontal sinus tenderness. Left sinus exhibits no maxillary sinus tenderness and no frontal sinus tenderness.  Mouth/Throat: No oropharyngeal exudate.  Eyes: EOM are normal. Pupils are equal, round, and reactive to light.  Neck: Neck supple.  Cardiovascular: Normal rate, regular rhythm and normal heart sounds.   Pulmonary/Chest: Effort normal. Not tachypneic. No respiratory distress. She has decreased breath sounds.  Abdominal: Soft. Bowel sounds are normal. There is no tenderness. There is no rebound.  Lymphadenopathy:    She has no cervical adenopathy.          Assessment & Plan:

## 2011-11-26 NOTE — Assessment & Plan Note (Signed)
Will give her a tri-pack to see if this helps her improve.

## 2011-11-26 NOTE — Progress Notes (Signed)
Addended by: Jaylean Buenaventura C on: 11/26/2011 04:41 PM   Modules accepted: Orders

## 2011-12-02 ENCOUNTER — Encounter: Payer: Medicare Other | Admitting: Physician Assistant

## 2011-12-11 ENCOUNTER — Other Ambulatory Visit: Payer: Self-pay | Admitting: *Deleted

## 2011-12-11 DIAGNOSIS — B20 Human immunodeficiency virus [HIV] disease: Secondary | ICD-10-CM

## 2011-12-11 MED ORDER — DARUNAVIR ETHANOLATE 800 MG PO TABS: 800.0000 mg | ORAL_TABLET | Freq: Every day | ORAL | Status: DC

## 2011-12-28 ENCOUNTER — Encounter: Payer: Medicare Other | Admitting: Family Medicine

## 2011-12-28 ENCOUNTER — Other Ambulatory Visit: Payer: Self-pay | Admitting: Infectious Diseases

## 2011-12-28 ENCOUNTER — Other Ambulatory Visit: Payer: Self-pay | Admitting: *Deleted

## 2011-12-28 DIAGNOSIS — B20 Human immunodeficiency virus [HIV] disease: Secondary | ICD-10-CM

## 2011-12-28 DIAGNOSIS — B379 Candidiasis, unspecified: Secondary | ICD-10-CM

## 2011-12-28 MED ORDER — DARUNAVIR ETHANOLATE 800 MG PO TABS: 800.0000 mg | ORAL_TABLET | Freq: Every day | ORAL | Status: DC

## 2012-02-04 ENCOUNTER — Encounter: Payer: Medicare Other | Admitting: Family Medicine

## 2012-02-15 ENCOUNTER — Other Ambulatory Visit: Payer: Self-pay | Admitting: Infectious Diseases

## 2012-02-15 DIAGNOSIS — Z113 Encounter for screening for infections with a predominantly sexual mode of transmission: Secondary | ICD-10-CM

## 2012-03-01 ENCOUNTER — Telehealth: Payer: Self-pay | Admitting: *Deleted

## 2012-03-01 NOTE — Telephone Encounter (Signed)
Pt left message stating that she is returning our call from 4/22.  I called pt and said that we were calling to remind her of her clinic appt on 03/03/12 @ 1430. Pt voiced understanding.

## 2012-03-03 ENCOUNTER — Encounter: Payer: Medicare Other | Admitting: Family Medicine

## 2012-03-16 ENCOUNTER — Encounter: Payer: Self-pay | Admitting: *Deleted

## 2012-03-16 DIAGNOSIS — IMO0002 Reserved for concepts with insufficient information to code with codable children: Secondary | ICD-10-CM

## 2012-03-16 NOTE — Progress Notes (Unsigned)
Patient ID: Cathy Smith, female   DOB: 1966/02/12, 46 y.o.   MRN: [id number] Pt has either No Showed or Cancelled a total of 4 appts for Colpolscopy at [facility] since January 2013.  Her next appt is scheduled for Thurs., Apr 07, 2012 @ 2:30 PM.  Please remind her of this appt and emphasize the importance of keeping the appt.

## 2012-03-17 NOTE — Progress Notes (Signed)
Consider having bridge counselor assist her in making this appt thanks

## 2012-03-23 ENCOUNTER — Other Ambulatory Visit: Payer: Medicare Other

## 2012-03-23 DIAGNOSIS — B20 Human immunodeficiency virus [HIV] disease: Secondary | ICD-10-CM

## 2012-03-23 DIAGNOSIS — Z79899 Other long term (current) drug therapy: Secondary | ICD-10-CM | POA: Diagnosis not present

## 2012-03-23 DIAGNOSIS — Z113 Encounter for screening for infections with a predominantly sexual mode of transmission: Secondary | ICD-10-CM

## 2012-03-23 LAB — COMPLETE METABOLIC PANEL WITH GFR
AST: 19 U/L (ref 0–37)
Albumin: 4.6 g/dL (ref 3.5–5.2)
Alkaline Phosphatase: 93 U/L (ref 39–117)
BUN: 11 mg/dL (ref 6–23)
Calcium: 9.2 mg/dL (ref 8.4–10.5)
Chloride: 105 mEq/L (ref 96–112)
Potassium: 3.5 mEq/L (ref 3.5–5.3)
Sodium: 142 mEq/L (ref 135–145)
Total Protein: 8.3 g/dL (ref 6.0–8.3)

## 2012-03-23 LAB — CBC
HCT: 36.3 % (ref 36.0–46.0)
Hemoglobin: 12.1 g/dL (ref 12.0–15.0)
RDW: 12.6 % (ref 11.5–15.5)
WBC: 2.5 10*3/uL — ABNORMAL LOW (ref 4.0–10.5)

## 2012-03-23 LAB — LIPID PANEL
Cholesterol: 180 mg/dL (ref 0–200)
LDL Cholesterol: 72 mg/dL (ref 0–99)
Total CHOL/HDL Ratio: 5.3 Ratio
VLDL: 74 mg/dL — ABNORMAL HIGH (ref 0–40)

## 2012-03-23 LAB — RPR

## 2012-03-24 LAB — HIV-1 RNA QUANT-NO REFLEX-BLD
HIV 1 RNA Quant: <20 copies/mL (ref <20)
HIV-1 RNA Quant, Log: <1.3 {Log} (ref <1.30)

## 2012-03-24 LAB — T-HELPER CELL (CD4) - (RCID CLINIC ONLY)
CD4 % Helper T Cell: 13 % — ABNORMAL LOW (ref 33–55)
CD4 T Cell Abs: 120 uL — ABNORMAL LOW (ref 400–2700)

## 2012-03-30 NOTE — Progress Notes (Signed)
Referral given to [facility] for Bridge Counselor to assist pt with making and keeping GYN appt at [facility].

## 2012-04-06 ENCOUNTER — Telehealth: Payer: Self-pay | Admitting: *Deleted

## 2012-04-06 ENCOUNTER — Ambulatory Visit (INDEPENDENT_AMBULATORY_CARE_PROVIDER_SITE_OTHER): Payer: Medicare Other | Admitting: Infectious Diseases

## 2012-04-06 ENCOUNTER — Encounter: Payer: Self-pay | Admitting: Infectious Diseases

## 2012-04-06 VITALS — BP 161/99 | HR 92 | Temp 97.7°F | Ht 60.0 in | Wt 150.0 lb

## 2012-04-06 DIAGNOSIS — R87612 Low grade squamous intraepithelial lesion on cytologic smear of cervix (LGSIL): Secondary | ICD-10-CM | POA: Diagnosis not present

## 2012-04-06 DIAGNOSIS — I10 Essential (primary) hypertension: Secondary | ICD-10-CM

## 2012-04-06 DIAGNOSIS — B20 Human immunodeficiency virus [HIV] disease: Secondary | ICD-10-CM

## 2012-04-06 DIAGNOSIS — IMO0002 Reserved for concepts with insufficient information to code with codable children: Secondary | ICD-10-CM

## 2012-04-06 MED ORDER — HYDROCHLOROTHIAZIDE 25 MG PO TABS: 50.0000 mg | ORAL_TABLET | Freq: Every day | ORAL | Status: DC

## 2012-04-06 NOTE — Assessment & Plan Note (Signed)
She is doing ok, her CD4 is still sub-optimal but she is undetectable. She will cont her current rx. She is given condoms. vax up to date. See her back in 3 months.

## 2012-04-06 NOTE — Assessment & Plan Note (Signed)
Rechecked her Bp- was 142/100. Will increase her HCTZ, have her recheck her BP at drug store, keep log. See her back in 3 months.

## 2012-04-06 NOTE — Telephone Encounter (Signed)
Pharmacy called and advised that the Rx was written for 15 days and asked if it should be 30 days. After review of the note advised the pharmacy to change quantity to 60 tabs from 30 tabs.

## 2012-04-06 NOTE — Assessment & Plan Note (Signed)
She has GYN eval tomorrow.

## 2012-04-06 NOTE — Progress Notes (Signed)
  Subjective:    Patient ID: Cathy Smith, female    DOB: 1966/10/22, 46 y.o.   MRN: [id number]  HPI 46 yo F with hx of HIV+ on ISS/DRVr/TRV.  Last CD4 160 and VL <20 (11-09-2011). Last Genotype naive.  Prev abn Genotypes. On November 30, 2009 she underwent anterior cervical  decompression, C4-5, C5-6. Arthrodesis C4-C5, 7-mm structural allograft  and C5-C6, 6-mm structural allograft. Anterior instrumentation 30-mm  Synthes Vectra plate with 14 mm screws. Was d/c home on 12-02-09. 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 She has GYN f/u appt 04-07-12.  Feeling well, without complaints. Neck feels better. No probs with ART.  Had been off HCTZ, difficulty swallowing.   HIV 1 RNA Quant (copies/mL)  Date Value  03/23/2012 <20   11/09/2011 <20   08/12/2011 <20      CD4 T Cell Abs (cmm)  Date Value  03/23/2012 120*  11/09/2011 160*  08/12/2011 180*      Review of Systems  Constitutional: Negative for fever, chills, appetite change and unexpected weight change.  Respiratory: Negative for cough and shortness of breath.   Cardiovascular: Negative for chest pain.  Gastrointestinal: Negative for diarrhea.  Genitourinary: Negative for dysuria.  Neurological: Positive for headaches.       Occas HA. Takes alleve.        Objective:   Physical Exam  Constitutional: She appears well-developed and well-nourished. No distress.  HENT:  Mouth/Throat: No oropharyngeal exudate.  Eyes: EOM are normal. Pupils are equal, round, and reactive to light.  Neck: Neck supple.  Cardiovascular: Normal rate, regular rhythm and normal heart sounds.   Pulmonary/Chest: Effort normal and breath sounds normal.  Abdominal: Soft. Bowel sounds are normal. She exhibits no distension. There is no tenderness.  Lymphadenopathy:    She has no cervical adenopathy.          Assessment & Plan:

## 2012-04-07 ENCOUNTER — Ambulatory Visit (INDEPENDENT_AMBULATORY_CARE_PROVIDER_SITE_OTHER): Payer: Medicare Other | Admitting: Family Medicine

## 2012-04-07 ENCOUNTER — Encounter: Payer: Self-pay | Admitting: Family Medicine

## 2012-04-07 ENCOUNTER — Other Ambulatory Visit (HOSPITAL_COMMUNITY)
Admission: RE | Admit: 2012-04-07 | Discharge: 2012-04-07 | Disposition: A | Discharge status: Home / Self Care | Payer: Medicare Other | Source: Ambulatory Visit | Attending: Family Medicine | Admitting: Family Medicine

## 2012-04-07 VITALS — BP 150/90 | HR 98 | Temp 97.9°F | Ht 60.0 in | Wt 149.1 lb

## 2012-04-07 DIAGNOSIS — N893 Dysplasia of vagina, unspecified: Secondary | ICD-10-CM | POA: Insufficient documentation

## 2012-04-07 DIAGNOSIS — IMO0002 Reserved for concepts with insufficient information to code with codable children: Secondary | ICD-10-CM

## 2012-04-07 DIAGNOSIS — R87612 Low grade squamous intraepithelial lesion on cytologic smear of cervix (LGSIL): Secondary | ICD-10-CM

## 2012-04-07 NOTE — Patient Instructions (Signed)
Colposcopy Care After Colposcopy is a procedure in which a special tool is used to magnify the surface of the cervix. A tissue sample (biopsy) may also be taken. This sample will be looked at for cervical cancer or other problems. After the test:  You may have some cramping.   Lie down for a few minutes if you feel lightheaded.    You may have some bleeding which should stop in a few days.  HOME CARE  Do not have sex or use tampons for 2 to 3 days or as told.   Only take medicine as told by your doctor.   Continue to take your birth control pills as usual.  Finding out the results of your test Ask when your test results will be ready. Make sure you get your test results. GET HELP RIGHT AWAY IF:  You are bleeding a lot or are passing blood clots.   You develop a fever of 102 F (38.9 C) or higher.   You have abnormal vaginal discharge.   You have cramps that do not go away with medicine.   You feel lightheaded, dizzy, or pass out (faint).  MAKE SURE YOU:   Understand these instructions.   Will watch your condition.   Will get help right away if you are not doing well or get worse.  Document Released: 04/13/2008 Document Revised: 10/15/2011 Document Reviewed: 04/13/2008 [employer] Patient Information 2012 [employer]. 

## 2012-04-07 NOTE — Progress Notes (Signed)
Patient seen for follow up of LSIL PAP of vaginal cuff.  Patient given informed consent, signed copy in the chart, time out was performed.  Placed in lithotomy position. Vaginal cuff viewed with speculum and colposcope after application of lugol's solution.   Colposcopy adequate?  yes Acetowhite lesions?right and left vaginal cuff areas Punctation?yes Mosaicism?  no Abnormal vasculature?  yes Biopsies?right and left sides ECC?no  Patient was given post procedure instructions.  She will return in 4 weeks for results.

## 2012-05-05 ENCOUNTER — Telehealth: Payer: Self-pay | Admitting: Obstetrics and Gynecology

## 2012-05-05 ENCOUNTER — Ambulatory Visit (INDEPENDENT_AMBULATORY_CARE_PROVIDER_SITE_OTHER): Payer: Medicare Other | Admitting: Obstetrics and Gynecology

## 2012-05-05 VITALS — BP 148/94 | HR 82 | Ht 61.0 in | Wt 151.2 lb

## 2012-05-05 DIAGNOSIS — R87612 Low grade squamous intraepithelial lesion on cytologic smear of cervix (LGSIL): Secondary | ICD-10-CM

## 2012-05-05 DIAGNOSIS — R87613 High grade squamous intraepithelial lesion on cytologic smear of cervix (HGSIL): Secondary | ICD-10-CM

## 2012-05-05 DIAGNOSIS — IMO0002 Reserved for concepts with insufficient information to code with codable children: Secondary | ICD-10-CM

## 2012-05-05 NOTE — Progress Notes (Signed)
Salisha Bardsley [id number] y.o.[id number]  Chief Complaint  Patient presents with  . Results         SUBJECTIVE  HPI: She presents for results of her vaginal cuff biopsy done here with colposcopy on 04/07/2012. She states her hysterectomy was over 10 years ago but she does not recall the indication, the provider, or the facility. On further questioning she reports that she had had fibroids and she believes  Dr. Gaynell Face did the procedure. She denies vaginal bleeding or pelvic pain at present. She is compliant with her HIV medical regimen.  Past Medical History  Diagnosis Date  . HIV infection   . Hypertension    Past Surgical History  Procedure Date  . Thyroid surgery   . Abdominal hysterectomy    History   Social History  . Marital Status: Married    Spouse Name: N/A    Number of Children: N/A  . Years of Education: N/A   Occupational History  . Not on file.   Social History Main Topics  . Smoking status: Never Smoker   . Smokeless tobacco: Never Used  . Alcohol Use: No  . Drug Use: No  . Sexually Active: Yes -- Female partner(s)    Birth Control/ Protection: Condom     pt. given condoms   Other Topics Concern  . Not on file   Social History Narrative  . No narrative on file   Current Outpatient Prescriptions on File Prior to Visit  Medication Sig Dispense Refill  . Darunavir Ethanolate (PREZISTA) 800 MG tablet Take 1 tablet (800 mg total) by mouth daily.  30 tablet  5  . emtricitabine-tenofovir (TRUVADA) 200-300 MG per tablet Take 1 tablet by mouth daily.  30 tablet  11  . ISENTRESS 400 MG tablet TAKE ONE TABLET BY MOUTH TWICE DAILY  60 tablet  PRN  . NORVIR 100 MG TABS TAKE 1 TABLET BY MOUTH EVERY DAY  30 tablet  PRN  . sulfamethoxazole-trimethoprim (BACTRIM DS) 800-160 MG per tablet TAKE ONE TABLET BY MOUTH ON MONDAYS, WEDNESDAY AND FRIDAYS  12 tablet  PRN  . fluconazole (DIFLUCAN) 100 MG tablet TAKE 1 TABLET BY MOUTH EVERY DAY  30 tablet  PRN  . hydrochlorothiazide  (HYDRODIURIL) 25 MG tablet Take 2 tablets (50 mg total) by mouth daily.  60 tablet  PRN   No Known Allergies  ROS: Pertinent items in HPI  OBJECTIVE Blood pressure 148/94, pulse 82, height 5\' 1"  (1.549 m), weight 151 lb 3.2 oz (68.584 kg).  GENERAL: Well-developed, well-nourished female in no acute distress.  HEENT: Normocephalic, good dentition HEART: normal rate RESP: normal effort ABDOMEN: Soft, nontender EXTREMITIES: Nontender, no edema NEURO: Alert and oriented  LAB RESULTS FINAL DIAGNOSIS Diagnosis 1. 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 2. 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 Abigail Miyamoto MD Pathologist, Electronic Signature (Case signed 04/11/2012) Specimen Gross and Clinical Information Specimen(s) Obtained: 1. Vagina, biopsy, left cuff 2. Vagina, biopsy, right cuff Specimen Clinical Information 2. LSIL. (lw) Gross 1. Received in formalin are tan, soft tissue fragments that are submitted in toto. Number: 2, Size: each measuring 0.3 cm, (1 B) 2. Received in formalin are tan, soft tissue fragments that are submitted in toto. Number: 3, Size: each 0.3 cm, (1 B) (JC:kh 04-08-12)  Report signed out from the following location(s) [facility] [address],[place] [address].CLIA:[id number]., [facility] 1 of 2 No results found for this or any previous visit (from the past 24 hour(s)).     ASSESSMENT  1. HGSIL (high grade squamous intraepithelial dysplasia)    2. HIV infection PLAN In consultation with [name], she will be referred to GYN oncology for further management  T. meds and followup  with ID as scheduled    Nura Cahoon 05/05/2012 3:33 PM

## 2012-05-05 NOTE — Telephone Encounter (Signed)
Called Telford Nab at [facility] per Hollace Kinnier, CNM to refer patient. Left message on her Nancy's voicemail to call us back to set appt for patient. Patient states she is available anytime any day. Please call patient back when information received.

## 2012-05-05 NOTE — Patient Instructions (Signed)
Colposcopy Colposcopy is a procedure that uses a special lighted microscope (colposcope). It examines your cervix and vagina, or the area around the outside of the vagina, for signs of disease or abnormalities in the cells. You may be sent to a specialist (gynecologist) to do the colposcopy. A biopsy (tissue sample) may be collected during a colposcopy, if the caregiver finds any unusual cells. The biopsy is sent to the lab for further testing, and the results are reported back to your caregiver. A WOMAN MAY NEED THIS PROCEDURE IF:  She has had an abnormal pap smear (taking cells from the cervix for testing).   She has a sore on her cervix, and a Pap test was normal.   The Pap test suggests human papilloma virus (HPV). This virus can cause genital warts and is linked to the development of cervical cancer.   She has genital warts on the cervix, or in or around the outside of the vagina.   Her mother took the drug DES while pregnant.   She has painful intercourse.   She has vaginal bleeding, especially after sexual intercourse.   There is a need to evaluate the results of previous treatment.  BEFORE THE PROCEDURE   Colposcopy is done when you are not having a menstrual period.   For 24 hours before the colposcopy, do not:   Douche.   Your biopsy of vaginal cuff showed abnormal precancerous cells. You will receive a call from the Gyn Oncology group regarding your next appointment to arrange for treatment.  Use tampons.   Use medicines, creams, or suppositories in the vagina.   Have sexual intercourse.  PROCEDURE   A colposcopy is done while a woman is lying on her back with her feet in foot rests (stirrups).   A speculum is placed inside the vagina to keep it open and to allow the caregiver to see the cervix. This is the same instrument used to do a pap smear.   The colposcope is placed outside the vagina. It is used to magnify and examine the cervix, vagina, and the area around  the outside of the vagina.   A small amount of liquid solution is placed on the area that is to be viewed. This solution is placed on with a cotton applicator. This solution makes it easier to see the abnormal cells.   Your caregiver will suck out mucus and cells from the canal of the cervix.   Small pieces of tissue for biopsy may be taken at the same time. You may feel mild pain or discomfort when this is done.   Your caregiver will record the location of the abnormal areas and send the tissue samples to a lab for analysis.   If your caregiver biopsies the vagina or outside of the vagina, a local anesthetic (novocaine) is usually given.  AFTER THE PROCEDURE   You may have some cramping that often goes away in a few minutes. You may have some soreness for a couple of days.   You may take over-the-counter pain medicine as advised by your caregiver. Do not take aspirin because it can cause bleeding.   Lie down for a few minutes if you feel lightheaded.   You may have some bleeding or dark discharge that should stop in a few days.   You may need to wear a sanitary pad for a few days.  HOME CARE INSTRUCTIONS   Avoid sex, douching, and using tampons for a week or as directed.  Only take medicine as directed by your caregiver.   Continue to take birth control pills, if you are on them.   Not all test results are available during your visit. If your test results are not back during the visit, make an appointment with your caregiver to find out the results. Do not assume everything is normal if you have not heard from your caregiver or the medical facility. It is important for you to follow up on all of your test results.   Follow your caregiver's advice regarding medicines, activity, follow-up visits, and follow-up Pap tests.  SEEK MEDICAL CARE IF:   You develop a rash.   You have problems with your medicine.  SEEK IMMEDIATE MEDICAL CARE IF:  You are bleeding heavily or are passing  blood clots.   You develop a fever over 102 F (38.9 C), with or without chills.   You have abnormal vaginal discharge.   You are having cramps that do not go away after taking your pain medicine.   You feel lightheaded, dizzy, or faint.   You develop stomach pain.  Document Released: 01/16/2003 Document Revised: 10/15/2011 Document Reviewed: 08/29/2009 [facility] Patient Information 2012 [place], [place].

## 2012-05-06 NOTE — Telephone Encounter (Signed)
GYN Oncology office called back appt made for July 26 at 1030, pt needs to arrive at 10. Need to call patient and inform her of this appt.

## 2012-05-09 NOTE — Telephone Encounter (Signed)
Called pt and informed pt of her appt for July 26@ 1030am and to arrive at 10am.  Pt stated understanding and had no further questions.

## 2012-05-18 ENCOUNTER — Other Ambulatory Visit: Payer: Self-pay | Admitting: *Deleted

## 2012-05-18 DIAGNOSIS — Z21 Asymptomatic human immunodeficiency virus [HIV] infection status: Secondary | ICD-10-CM

## 2012-05-18 DIAGNOSIS — B20 Human immunodeficiency virus [HIV] disease: Secondary | ICD-10-CM

## 2012-05-18 MED ORDER — DARUNAVIR ETHANOLATE 800 MG PO TABS: 800.0000 mg | ORAL_TABLET | Freq: Every day | ORAL | Status: DC

## 2012-05-31 ENCOUNTER — Telehealth: Payer: Self-pay | Admitting: *Deleted

## 2012-05-31 NOTE — Telephone Encounter (Signed)
Pt left message stating that she has a question. I returned her call and left a message requesting her to call back to the nurse voice mail and state her question or concern. I will try to call her back before 4pm today.

## 2012-05-31 NOTE — Telephone Encounter (Signed)
Pt called because she was concerned about her future appointment with Dr. Stanford Breed on 06/03/2012. The pt was instructed to arrive 30 prior to scheduled appointment time of 1030 to register. She was also instructed to come to the main entrance of cancer center and proceed to the registration desk. The pt was told what to expect at the visit(vitals taken,exam and conversation with the doctor) and was told to please call back if she had anymore questions. Ms. Smithers stated that she appreciated the information and would be on time on Friday for her appointment

## 2012-05-31 NOTE — Telephone Encounter (Signed)
Pt left second message stating that she wants information about her appt @ [facility]- date and time.  I returned the call and left her a new message stating that her appt is with Dr. De Blanch on 06/03/12 @ 1030.  She should arrive @ 1015.  She may call back if she has additional questions.

## 2012-06-03 ENCOUNTER — Ambulatory Visit: Payer: Medicare Other | Attending: Gynecology | Admitting: Gynecology

## 2012-06-03 ENCOUNTER — Encounter: Payer: Self-pay | Admitting: Gynecology

## 2012-06-03 VITALS — BP 162/90 | HR 88 | Temp 98.6°F | Resp 20 | Ht 61.69 in | Wt 151.0 lb

## 2012-06-03 DIAGNOSIS — D072 Carcinoma in situ of vagina: Secondary | ICD-10-CM | POA: Diagnosis not present

## 2012-06-03 DIAGNOSIS — N893 Dysplasia of vagina, unspecified: Secondary | ICD-10-CM | POA: Diagnosis not present

## 2012-06-03 DIAGNOSIS — Z21 Asymptomatic human immunodeficiency virus [HIV] infection status: Secondary | ICD-10-CM | POA: Insufficient documentation

## 2012-06-03 DIAGNOSIS — I10 Essential (primary) hypertension: Secondary | ICD-10-CM | POA: Diagnosis not present

## 2012-06-03 DIAGNOSIS — Z79899 Other long term (current) drug therapy: Secondary | ICD-10-CM | POA: Insufficient documentation

## 2012-06-03 DIAGNOSIS — Z90710 Acquired absence of both cervix and uterus: Secondary | ICD-10-CM | POA: Diagnosis not present

## 2012-06-03 DIAGNOSIS — IMO0002 Reserved for concepts with insufficient information to code with codable children: Secondary | ICD-10-CM

## 2012-06-03 NOTE — Patient Instructions (Signed)
You have been given a prescription for Efudex cream to be used in the vagina. Please follow carefully the instruction sheet. We will plan on seeing you for repeat evaluation approximately 4-6 weeks after you complete the treatment that has been recommended.

## 2012-06-03 NOTE — Progress Notes (Signed)
Consult Note: Gyn-Onc   [name] 46 y.o. female  Chief Complaint  Patient presents with  . Abnormal Pap Smear    New pt     HPI: 47 year old African American female seen in consultation requested of [name], and [name] regarding management of a newly diagnosed severe dysplasia of the upper vagina. Briefly, the patient's history includes having a past history of abdominal hysterectomy for what we understand and have been fibroids. She denies any problems with Pap smears prior to the hysterectomy. More recently she has had some abnormal Pap smears ultimately resulting in colposcopy and directed biopsy on 04/07/2012. Laps use of the upper vagina revealed high-grade squamous intraepithelial lesion.  The patient is HIV infected and currently under going antiretrovirals therapy.  Patient denies any pelvic pain pressure vaginal bleeding or discharge.  Review of Systems:10 point review of systems is negative as noted above.   Vitals: Blood pressure 162/90, pulse 88, temperature 98.6 F (37 C), temperature source Oral, resp. rate 20, height 5' 1.69" (1.567 m), weight 151 lb (68.493 kg).  Physical Exam: General : The patient is a healthy woman in no acute distress.  HEENT: normocephalic, extraoccular movements normal; neck is supple without thyromegally  Lynphnodes: Supraclavicular and inguinal nodes not enlarged  Abdomen: Soft, non-tender, no ascites, no organomegally, no masses, no hernias  Pelvic:  EGBUS: Normal female  Vagina: Normal, no lesions  Urethra and Bladder: Normal, non-tender  Cervix: Surgically absent  Uterus: Surgically absent  Bi-manual examination: Non-tender; no adenxal masses or nodularity  Rectal: normal sphincter tone, no masses, no blood  Lower extremities: No edema or varicosities. Normal range of motion   Procedure note: Colposcopic examination of the vagina was performed. 3% acetic acid is applied and the vagina and the vagina inspected carefully.  Areas of raised white epithelium with punctation are noted in several places including the vaginal apex and lateral vaginal walls. There is no evidence of mosaicism.   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 Treatment options were discussed with the patient. Because of its multifocal nature, I would recommend the patient be treated with intravaginal Efudex 5%. She will use Efudex for 4 consecutive nights this month and then 4 consecutive nights the following month. Approximately 4-6 weeks after the second treatment she returned for repeat Pap smear and colposcopy The patient is given a specific instruction sheet on managing the Efudex including how to protect the vulva. Instructions are reviewed by myself and with Warner Mccreedy NP  No Known Allergies  Past Medical History  Diagnosis Date  . HIV infection   . Hypertension     Past Surgical History  Procedure Date  . Thyroid surgery   . Abdominal hysterectomy     Current Outpatient Prescriptions  Medication Sig Dispense Refill  . Darunavir Ethanolate (PREZISTA) 800 MG tablet Take 1 tablet (800 mg total) by mouth daily.  30 tablet  prn  . emtricitabine-tenofovir (TRUVADA) 200-300 MG per tablet Take 1 tablet by mouth daily.  30 tablet  11  . fluconazole (DIFLUCAN) 100 MG tablet TAKE 1 TABLET BY MOUTH EVERY DAY  30 tablet  PRN  . hydrochlorothiazide (HYDRODIURIL) 25 MG tablet Take 2 tablets (50 mg total) by mouth daily.  60 tablet  PRN  . ISENTRESS 400 MG tablet TAKE ONE TABLET BY MOUTH TWICE DAILY  60 tablet  PRN  . NORVIR 100 MG TABS TAKE 1 TABLET BY MOUTH EVERY DAY  30 tablet  PRN  .  sulfamethoxazole-trimethoprim (BACTRIM DS) 800-160 MG per tablet TAKE ONE TABLET BY MOUTH ON MONDAYS, WEDNESDAY AND FRIDAYS  12 tablet  PRN    History   Social History  . Marital Status: Married    Spouse Name: N/A    Number of Children: N/A  . Years of Education: N/A   Occupational History  . Not on file.     Social History Main Topics  . Smoking status: Never Smoker   . Smokeless tobacco: Never Used  . Alcohol Use: No  . Drug Use: No  . Sexually Active: Yes -- Female partner(s)    Birth Control/ Protection: Condom     pt. given condoms   Other Topics Concern  . Not on file   Social History Narrative  . No narrative on file    Family History  Problem Relation Age of Onset  . Cataracts Mother   . Hypertension Father       Jeannette Corpus, MD 06/03/2012, 11:41 AM

## 2012-06-22 ENCOUNTER — Other Ambulatory Visit: Payer: Self-pay | Admitting: Infectious Diseases

## 2012-06-22 DIAGNOSIS — B20 Human immunodeficiency virus [HIV] disease: Secondary | ICD-10-CM

## 2012-06-29 ENCOUNTER — Other Ambulatory Visit (HOSPITAL_COMMUNITY)
Admission: RE | Admit: 2012-06-29 | Discharge: 2012-06-29 | Disposition: A | Discharge status: Home / Self Care | Payer: Medicare Other | Source: Ambulatory Visit | Attending: Infectious Diseases | Admitting: Infectious Diseases

## 2012-06-29 ENCOUNTER — Other Ambulatory Visit: Payer: Medicare Other

## 2012-06-29 DIAGNOSIS — Z113 Encounter for screening for infections with a predominantly sexual mode of transmission: Secondary | ICD-10-CM | POA: Insufficient documentation

## 2012-06-29 DIAGNOSIS — B20 Human immunodeficiency virus [HIV] disease: Secondary | ICD-10-CM

## 2012-06-29 LAB — CBC WITH DIFFERENTIAL/PLATELET
Hemoglobin: 12.5 g/dL (ref 12.0–15.0)
Lymphs Abs: 1.3 10*3/uL (ref 0.7–4.0)
Monocytes Relative: 10 % (ref 3–12)
Neutro Abs: 1.3 10*3/uL — ABNORMAL LOW (ref 1.7–7.7)
Neutrophils Relative %: 44 % (ref 43–77)
RBC: 3.93 MIL/uL (ref 3.87–5.11)

## 2012-06-29 LAB — COMPREHENSIVE METABOLIC PANEL
ALT: 14 U/L (ref 0–35)
Albumin: 4.2 g/dL (ref 3.5–5.2)
CO2: 25 mEq/L (ref 19–32)
Calcium: 9 mg/dL (ref 8.4–10.5)
Chloride: 106 mEq/L (ref 96–112)
Glucose, Bld: 93 mg/dL (ref 70–99)
Potassium: 3.8 mEq/L (ref 3.5–5.3)
Sodium: 139 mEq/L (ref 135–145)
Total Protein: 8.3 g/dL (ref 6.0–8.3)

## 2012-06-30 LAB — HIV-1 RNA QUANT-NO REFLEX-BLD
HIV 1 RNA Quant: <20 copies/mL (ref <20)
HIV-1 RNA Quant, Log: <1.3 {Log} (ref <1.30)

## 2012-07-06 ENCOUNTER — Other Ambulatory Visit: Payer: Self-pay | Admitting: Infectious Diseases

## 2012-07-06 DIAGNOSIS — B20 Human immunodeficiency virus [HIV] disease: Secondary | ICD-10-CM

## 2012-07-12 ENCOUNTER — Other Ambulatory Visit: Payer: Medicare Other

## 2012-07-13 ENCOUNTER — Encounter: Payer: Self-pay | Admitting: Infectious Diseases

## 2012-07-13 ENCOUNTER — Ambulatory Visit (INDEPENDENT_AMBULATORY_CARE_PROVIDER_SITE_OTHER): Payer: Medicare Other | Admitting: Infectious Diseases

## 2012-07-13 VITALS — BP 157/112 | HR 105 | Temp 97.9°F | Ht 61.5 in | Wt 148.0 lb

## 2012-07-13 DIAGNOSIS — IMO0002 Reserved for concepts with insufficient information to code with codable children: Secondary | ICD-10-CM

## 2012-07-13 DIAGNOSIS — Z113 Encounter for screening for infections with a predominantly sexual mode of transmission: Secondary | ICD-10-CM

## 2012-07-13 DIAGNOSIS — R300 Dysuria: Secondary | ICD-10-CM | POA: Insufficient documentation

## 2012-07-13 DIAGNOSIS — B20 Human immunodeficiency virus [HIV] disease: Secondary | ICD-10-CM | POA: Diagnosis not present

## 2012-07-13 DIAGNOSIS — I10 Essential (primary) hypertension: Secondary | ICD-10-CM | POA: Diagnosis not present

## 2012-07-13 DIAGNOSIS — Z79899 Other long term (current) drug therapy: Secondary | ICD-10-CM | POA: Diagnosis not present

## 2012-07-13 DIAGNOSIS — R87612 Low grade squamous intraepithelial lesion on cytologic smear of cervix (LGSIL): Secondary | ICD-10-CM | POA: Diagnosis not present

## 2012-07-13 NOTE — Assessment & Plan Note (Signed)
She doing well on her salvage regimen. Will continue this. She is offered condoms- refused. Now married. Will see back in 6 months with labs prior. She will call next week to get flu shot.

## 2012-07-13 NOTE — Progress Notes (Signed)
  Subjective:    Patient ID: MARITSSA HAUGHTON, female    DOB: April 10, 1966, 46 y.o.   MRN: [id number]  HPI 46 yo F with hx of HIV+ on ISS/DRVr/TRV.   Last Genotype naive. Prev abn Genotypes.  On November 30, 2009 she underwent anterior cervical  decompression, C4-5, C5-6. Arthrodesis C4-C5, 7-mm structural allograft  and C5-C6, 6-mm structural allograft. Anterior instrumentation 30-mm  Synthes Vectra plate with 14 mm screws.   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 She had GYN f/u being treated for vaginal dysplasia with premarin  Cream, efudex. Had dysuria this am.  No problems with ART.  havingsome issues with pain with rotation of her R arm, in AC fossa.   HIV 1 RNA Quant (copies/mL)  Date Value  06/29/2012 <20   03/23/2012 <20   11/09/2011 <20      CD4 T Cell Abs (cmm)  Date Value  06/29/2012 180*  03/23/2012 120*  11/09/2011 160*      Review of Systems  Constitutional:       BP repeated 142/95       Objective:   Physical Exam  Constitutional: She appears well-developed and well-nourished.  HENT:  Mouth/Throat: No oropharyngeal exudate.  Eyes: EOM are normal. Pupils are equal, round, and reactive to light.  Neck: Neck supple.  Cardiovascular: Normal rate, regular rhythm and normal heart sounds.   Pulmonary/Chest: Effort normal and breath sounds normal.  Abdominal: Soft. Bowel sounds are normal. She exhibits no distension. There is no tenderness.  Musculoskeletal: She exhibits no edema.  Lymphadenopathy:    She has no cervical adenopathy.          Assessment & Plan:

## 2012-07-13 NOTE — Assessment & Plan Note (Signed)
Greatly appreciate GYN f/u.  

## 2012-07-13 NOTE — Assessment & Plan Note (Signed)
Will check her UA, Ucx

## 2012-07-13 NOTE — Assessment & Plan Note (Signed)
Continue her current rx. DBP is still elevated on recheck. Could consider increasing HCTZ at f/u or adding ACE-I at f/u. She states that she checks her BP at [facility] and they are normal.

## 2012-07-14 ENCOUNTER — Other Ambulatory Visit: Payer: Self-pay | Admitting: Licensed Clinical Social Worker

## 2012-07-14 DIAGNOSIS — B20 Human immunodeficiency virus [HIV] disease: Secondary | ICD-10-CM

## 2012-07-14 LAB — URINALYSIS, ROUTINE W REFLEX MICROSCOPIC
Bilirubin Urine: NEGATIVE
Glucose, UA: NEGATIVE mg/dL
Hgb urine dipstick: NEGATIVE
Protein, ur: 100 mg/dL — AB
pH: 6 (ref 5.0–8.0)

## 2012-07-14 LAB — URINALYSIS, MICROSCOPIC ONLY
Bacteria, UA: NONE SEEN
Casts: NONE SEEN
Crystals: NONE SEEN

## 2012-07-14 MED ORDER — RITONAVIR 100 MG PO TABS: 100.0000 mg | ORAL_TABLET | Freq: Every day | ORAL | Status: DC

## 2012-07-14 MED ORDER — EMTRICITABINE-TENOFOVIR DF 200-300 MG PO TABS: 1.0000 | ORAL_TABLET | Freq: Every day | ORAL | Status: DC

## 2012-07-14 MED ORDER — SULFAMETHOXAZOLE-TMP DS 800-160 MG PO TABS: 1.0000 | ORAL_TABLET | Freq: Once | ORAL | Status: DC

## 2012-07-14 MED ORDER — DARUNAVIR ETHANOLATE 800 MG PO TABS: 800.0000 mg | ORAL_TABLET | Freq: Every day | ORAL | Status: DC

## 2012-07-14 MED ORDER — RALTEGRAVIR POTASSIUM 400 MG PO TABS: 400.0000 mg | ORAL_TABLET | Freq: Two times a day (BID) | ORAL | Status: DC

## 2012-07-15 ENCOUNTER — Other Ambulatory Visit: Payer: Self-pay | Admitting: *Deleted

## 2012-07-15 ENCOUNTER — Telehealth: Payer: Self-pay | Admitting: Infectious Diseases

## 2012-07-15 DIAGNOSIS — N390 Urinary tract infection, site not specified: Secondary | ICD-10-CM

## 2012-07-15 MED ORDER — CIPROFLOXACIN HCL 500 MG PO TABS: 500.0000 mg | ORAL_TABLET | Freq: Two times a day (BID) | ORAL | Status: DC

## 2012-07-15 MED ORDER — CIPROFLOXACIN HCL 500 MG PO TABS: 500.0000 mg | ORAL_TABLET | Freq: Two times a day (BID) | ORAL | Status: AC

## 2012-07-15 NOTE — Telephone Encounter (Signed)
Pt with pyuria on recent UA. Continues to have dysuria. Will rx cipro. Have pt call back if not improved.

## 2012-07-25 ENCOUNTER — Telehealth: Payer: Self-pay

## 2012-07-25 NOTE — Telephone Encounter (Signed)
Pt called c/o still having some burning upon urination.  She has finished the antibiotics  one day ago.    Pt states the symptoms are better but not completely gone.   Pt was advised to give the medication a few more day to work. If she continues to have problems call our office and speak with the triage nurse. Initially, the patient  requested a refill of Cipro.   Laurell Josephs, RN

## 2012-07-28 ENCOUNTER — Other Ambulatory Visit: Payer: Self-pay

## 2012-08-11 ENCOUNTER — Other Ambulatory Visit: Payer: Self-pay | Admitting: Licensed Clinical Social Worker

## 2012-08-11 DIAGNOSIS — B20 Human immunodeficiency virus [HIV] disease: Secondary | ICD-10-CM

## 2012-08-11 MED ORDER — SULFAMETHOXAZOLE-TMP DS 800-160 MG PO TABS: 1.0000 | ORAL_TABLET | ORAL | Status: DC

## 2012-08-11 MED ORDER — SULFAMETHOXAZOLE-TMP DS 800-160 MG PO TABS: 1.0000 | ORAL_TABLET | Freq: Once | ORAL | Status: DC

## 2012-08-11 MED ORDER — DARUNAVIR ETHANOLATE 800 MG PO TABS: 800.0000 mg | ORAL_TABLET | Freq: Every day | ORAL | Status: DC

## 2012-09-09 ENCOUNTER — Other Ambulatory Visit (HOSPITAL_COMMUNITY)
Admission: RE | Admit: 2012-09-09 | Discharge: 2012-09-09 | Disposition: A | Discharge status: Home / Self Care | Payer: Medicare Other | Source: Ambulatory Visit | Attending: Gynecology | Admitting: Gynecology

## 2012-09-09 ENCOUNTER — Encounter: Payer: Self-pay | Admitting: Gynecology

## 2012-09-09 ENCOUNTER — Ambulatory Visit: Payer: Medicare Other | Attending: Gynecology | Admitting: Gynecology

## 2012-09-09 VITALS — BP 138/80 | HR 80 | Temp 98.2°F | Resp 20 | Ht 61.69 in | Wt 152.6 lb

## 2012-09-09 DIAGNOSIS — Z124 Encounter for screening for malignant neoplasm of cervix: Secondary | ICD-10-CM | POA: Diagnosis not present

## 2012-09-09 DIAGNOSIS — N891 Moderate vaginal dysplasia: Secondary | ICD-10-CM

## 2012-09-09 DIAGNOSIS — D0730 Carcinoma in situ of unspecified female genital organs: Secondary | ICD-10-CM | POA: Diagnosis not present

## 2012-09-09 DIAGNOSIS — I10 Essential (primary) hypertension: Secondary | ICD-10-CM | POA: Insufficient documentation

## 2012-09-09 DIAGNOSIS — Z90710 Acquired absence of both cervix and uterus: Secondary | ICD-10-CM | POA: Insufficient documentation

## 2012-09-09 DIAGNOSIS — Z8489 Family history of other specified conditions: Secondary | ICD-10-CM | POA: Diagnosis not present

## 2012-09-09 DIAGNOSIS — Z21 Asymptomatic human immunodeficiency virus [HIV] infection status: Secondary | ICD-10-CM | POA: Diagnosis not present

## 2012-09-09 DIAGNOSIS — D072 Carcinoma in situ of vagina: Secondary | ICD-10-CM | POA: Diagnosis not present

## 2012-09-09 DIAGNOSIS — Z8249 Family history of ischemic heart disease and other diseases of the circulatory system: Secondary | ICD-10-CM | POA: Insufficient documentation

## 2012-09-09 NOTE — Progress Notes (Signed)
Consult Note: Gyn-Onc   Cathy Smith 46 y.o. female  Chief Complaint  Patient presents with  . VAIN    Follow up    Interval History: The patient returns today for followup as previously scheduled. She is undertaken to 4 day courses of intravaginal Efudex completed approximately a month ago. In between using Efudex she use Premarin vaginal cream. She tolerated the Efudex therapy well and had no vulvar irritation. She denies any vaginal discharge or bleeding.  HPI:46 year old [place] American female seen in consultation requested of [name], and [name] regarding management of a newly diagnosed severe dysplasia of the upper vagina. Briefly, the patient's history includes having a past history of abdominal hysterectomy for what we understand and have been fibroids. She denies any problems with Pap smears prior to the hysterectomy. More recently she has had some abnormal Pap smears ultimately resulting in colposcopy and directed biopsy on 04/07/2012. 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  The patient is HIV infected and currently under going antiretrovirals therapy.  Patient denies any pelvic pain pressure vaginal bleeding or discharge.   Review of Systems:10 point review of systems is negative as noted above.   Vitals: Blood pressure 138/80, pulse 80, temperature 98.2 F (36.8 C), temperature source Oral, resp. rate 20, height 5' 1.69" (1.567 m), weight 152 lb 9.6 oz (69.219 kg).  Physical Exam: General : The patient is a healthy woman in no acute distress.  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 Vagina: Normal, no lesions  Urethra and Bladder: Normal, non-tender  Cervix:  Surgically absent  Uterus: Surgically absent  Bi-manual examination: Non-tender; no adenxal masses or nodularity  Rectal: normal sphincter tone, no masses, no blood  Lower extremities: No edema or varicosities. Normal range of motion   Procedure note:  Colposcopic examination of the entire vagina is undertaken. We used acetic acid. There are multiple lesions in the vagina consistent with persistent vaginal dysplasia. A Pap smear is obtained.  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 Pap smears are obtained. We will change her Efudex regimen and have her use Efudex 1 g at bedtime once a week for 12 consecutive weeks. She will return to see me approximately a month after completing the Efudex program.  No Known Allergies  Past Medical History  Diagnosis Date  . HIV infection   . Hypertension     Past Surgical History  Procedure Date  . Thyroid surgery   . Abdominal hysterectomy     Current Outpatient Prescriptions  Medication Sig Dispense Refill  . Darunavir Ethanolate (PREZISTA) 800 MG tablet Take 1 tablet (800 mg total) by mouth daily.  30 tablet  prn  . emtricitabine-tenofovir (TRUVADA) 200-300 MG per tablet Take 1 tablet by mouth daily.  30 tablet  11  . fluconazole (DIFLUCAN) 100 MG tablet TAKE 1 TABLET BY MOUTH EVERY DAY  30 tablet  PRN  . fluorouracil (EFUDEX) 5 % cream Apply topically 2 (two) times daily.      . hydrochlorothiazide (HYDRODIURIL) 25 MG tablet Take 2 tablets (50 mg total) by mouth daily.  60 tablet  PRN  . raltegravir (ISENTRESS) 400 MG tablet Take 1 tablet (400 mg total) by mouth 2 (two) times  daily.  60 tablet  PRN  . ritonavir (NORVIR) 100 MG TABS Take 1 tablet (100 mg total) by mouth daily with breakfast.  30 tablet  PRN  . sulfamethoxazole-trimethoprim (BACTRIM DS) 800-160 MG per tablet Take 1 tablet by mouth 3 (three) times a week.  12 tablet  PRN    History   Social  History  . Marital Status: Married    Spouse Name: N/A    Number of Children: N/A  . Years of Education: N/A   Occupational History  . Not on file.   Social History Main Topics  . Smoking status: Never Smoker   . Smokeless tobacco: Never Used  . Alcohol Use: No  . Drug Use: No  . Sexually Active: Yes -- Female partner(s)    Birth Control/ Protection: Condom     pt. given condoms   Other Topics Concern  . Not on file   Social History Narrative  . No narrative on file    Family History  Problem Relation Age of Onset  . Cataracts Mother   . Hypertension Father       Jeannette Corpus, MD 09/09/2012, 1:02 PM

## 2012-09-09 NOTE — Addendum Note (Signed)
Addended by: Abram Sander on: 09/09/2012 03:06 PM   Modules accepted: Orders

## 2012-09-09 NOTE — Patient Instructions (Signed)
Please use Efudex 1 g in the vagina weekly for 12 consecutive weeks. You will return to see me approximately one month after completing this course of therapy.

## 2012-09-16 ENCOUNTER — Telehealth: Payer: Self-pay | Admitting: Gynecologic Oncology

## 2012-09-16 NOTE — Telephone Encounter (Signed)
Pt notified about pap results: negative.  No questions or concerns voiced. 

## 2012-10-14 ENCOUNTER — Other Ambulatory Visit: Payer: Self-pay | Admitting: Infectious Diseases

## 2012-10-14 DIAGNOSIS — Z1231 Encounter for screening mammogram for malignant neoplasm of breast: Secondary | ICD-10-CM

## 2012-11-18 ENCOUNTER — Ambulatory Visit (HOSPITAL_COMMUNITY)
Admission: RE | Admit: 2012-11-18 | Discharge: 2012-11-18 | Disposition: A | Discharge status: Home / Self Care | Payer: Medicare Other | Source: Ambulatory Visit | Attending: Infectious Diseases | Admitting: Infectious Diseases

## 2012-11-18 DIAGNOSIS — Z1231 Encounter for screening mammogram for malignant neoplasm of breast: Secondary | ICD-10-CM | POA: Diagnosis not present

## 2012-12-05 ENCOUNTER — Telehealth: Payer: Self-pay | Admitting: Gynecologic Oncology

## 2012-12-05 ENCOUNTER — Other Ambulatory Visit: Payer: Self-pay | Admitting: *Deleted

## 2012-12-05 NOTE — Telephone Encounter (Signed)
auth refill x2 on Efudex.  RTC 01/09/13.

## 2012-12-05 NOTE — Telephone Encounter (Signed)
Message left with date and time for a follow up appt with Dr. Stanford Breed on 01/09/13 at 11:00am.  Instructed to call the office for any questions or concerns.

## 2012-12-20 ENCOUNTER — Ambulatory Visit (INDEPENDENT_AMBULATORY_CARE_PROVIDER_SITE_OTHER): Payer: Medicare Other | Admitting: Infectious Diseases

## 2012-12-20 ENCOUNTER — Other Ambulatory Visit (HOSPITAL_COMMUNITY)
Admission: RE | Admit: 2012-12-20 | Discharge: 2012-12-20 | Disposition: A | Discharge status: Home / Self Care | Payer: Medicare Other | Source: Ambulatory Visit | Attending: Infectious Diseases | Admitting: Infectious Diseases

## 2012-12-20 ENCOUNTER — Encounter: Payer: Self-pay | Admitting: Infectious Diseases

## 2012-12-20 VITALS — BP 166/108 | HR 90 | Temp 98.1°F | Wt 152.0 lb

## 2012-12-20 DIAGNOSIS — Z113 Encounter for screening for infections with a predominantly sexual mode of transmission: Secondary | ICD-10-CM | POA: Diagnosis not present

## 2012-12-20 DIAGNOSIS — R300 Dysuria: Secondary | ICD-10-CM

## 2012-12-20 DIAGNOSIS — R6889 Other general symptoms and signs: Secondary | ICD-10-CM

## 2012-12-20 DIAGNOSIS — B20 Human immunodeficiency virus [HIV] disease: Secondary | ICD-10-CM

## 2012-12-20 DIAGNOSIS — IMO0002 Reserved for concepts with insufficient information to code with codable children: Secondary | ICD-10-CM

## 2012-12-20 DIAGNOSIS — Z79899 Other long term (current) drug therapy: Secondary | ICD-10-CM

## 2012-12-20 DIAGNOSIS — Z23 Encounter for immunization: Secondary | ICD-10-CM | POA: Diagnosis not present

## 2012-12-20 LAB — LIPID PANEL
HDL: 42 mg/dL (ref >39)
LDL Cholesterol: 78 mg/dL (ref 0–99)
Total CHOL/HDL Ratio: 3.5 Ratio
Triglycerides: 136 mg/dL (ref <150)

## 2012-12-20 LAB — COMPREHENSIVE METABOLIC PANEL
ALT: 15 U/L (ref 0–35)
AST: 17 U/L (ref 0–37)
CO2: 26 mEq/L (ref 19–32)
Calcium: 8.6 mg/dL (ref 8.4–10.5)
Chloride: 105 mEq/L (ref 96–112)
Sodium: 140 mEq/L (ref 135–145)
Total Bilirubin: 0.5 mg/dL (ref 0.3–1.2)
Total Protein: 8.1 g/dL (ref 6.0–8.3)

## 2012-12-20 LAB — CBC
Platelets: 194 10*3/uL (ref 150–400)
RBC: 3.84 MIL/uL — ABNORMAL LOW (ref 3.87–5.11)
WBC: 3.3 10*3/uL — ABNORMAL LOW (ref 4.0–10.5)

## 2012-12-20 LAB — RPR

## 2012-12-20 MED ORDER — CIPROFLOXACIN HCL 500 MG PO TABS: 500.0000 mg | ORAL_TABLET | Freq: Two times a day (BID) | ORAL | Status: DC

## 2012-12-20 NOTE — Addendum Note (Signed)
Addended by: Mariea Clonts D on: 12/20/2012 11:56 AM   Modules accepted: Orders

## 2012-12-20 NOTE — Assessment & Plan Note (Signed)
Appreciate GYN f/u.  

## 2012-12-20 NOTE — Assessment & Plan Note (Signed)
Appears to be doing well with her current ART. Will continue, she gets flyu shot today. Refuses condoms. Will check her labs.

## 2012-12-20 NOTE — Assessment & Plan Note (Signed)
Will check UA, UCx. Empiric Cipro while awaiting results.

## 2012-12-20 NOTE — Progress Notes (Signed)
47 yo F with hx of HIV+ on ISS/DRVr/TRV. Last Genotype naive. Prev abn Genotypes.  On November 30, 2009 she underwent anterior cervical  decompression, C4-5, C5-6. Arthrodesis C4-C5, 7-mm structural allograft  and C5-C6, 6-mm structural allograft. Anterior instrumentation 30-mm  Synthes Vectra plate with 14 mm screws.  Also with hx of CIN II. She had GYN f/u being treated for vaginal dysplasia with premarin cream (MWF), efudex (Tu).  Thinks UTI is flaring back up again. For last 4 days has been having dysuria. No vaginal sores. No cloudiness or hematuria. No f/c. No flank pain.   No problems with ART. No missed.   HIV 1 RNA Quant (copies/mL)  Date Value  06/29/2012 <20   03/23/2012 <20   11/09/2011 <20      CD4 T Cell Abs (cmm)  Date Value  06/29/2012 180*  03/23/2012 120*  11/09/2011 160*

## 2012-12-21 LAB — URINALYSIS, ROUTINE W REFLEX MICROSCOPIC
Glucose, UA: NEGATIVE mg/dL
Leukocytes, UA: NEGATIVE
Specific Gravity, Urine: 1.023 (ref 1.005–1.030)
pH: 5.5 (ref 5.0–8.0)

## 2012-12-21 LAB — URINALYSIS, MICROSCOPIC ONLY
Casts: NONE SEEN
Squamous Epithelial / LPF: NONE SEEN

## 2012-12-22 LAB — URINE CULTURE: Colony Count: NO GROWTH

## 2012-12-23 LAB — HIV-1 RNA QUANT-NO REFLEX-BLD: HIV 1 RNA Quant: <20 copies/mL (ref <20)

## 2013-01-09 ENCOUNTER — Other Ambulatory Visit (HOSPITAL_COMMUNITY)
Admission: RE | Admit: 2013-01-09 | Discharge: 2013-01-09 | Disposition: A | Discharge status: Home / Self Care | Payer: Medicare Other | Source: Ambulatory Visit | Attending: Gynecology | Admitting: Gynecology

## 2013-01-09 ENCOUNTER — Encounter: Payer: Self-pay | Admitting: Gynecology

## 2013-01-09 ENCOUNTER — Other Ambulatory Visit: Payer: Medicare Other

## 2013-01-09 ENCOUNTER — Ambulatory Visit: Payer: Medicare Other | Attending: Gynecology | Admitting: Gynecology

## 2013-01-09 VITALS — BP 120/80 | HR 60 | Temp 97.7°F | Resp 16 | Ht 62.0 in | Wt 152.0 lb

## 2013-01-09 DIAGNOSIS — I10 Essential (primary) hypertension: Secondary | ICD-10-CM | POA: Diagnosis not present

## 2013-01-09 DIAGNOSIS — Z79899 Other long term (current) drug therapy: Secondary | ICD-10-CM | POA: Diagnosis not present

## 2013-01-09 DIAGNOSIS — Z113 Encounter for screening for infections with a predominantly sexual mode of transmission: Secondary | ICD-10-CM | POA: Diagnosis not present

## 2013-01-09 DIAGNOSIS — Z21 Asymptomatic human immunodeficiency virus [HIV] infection status: Secondary | ICD-10-CM | POA: Insufficient documentation

## 2013-01-09 DIAGNOSIS — D072 Carcinoma in situ of vagina: Secondary | ICD-10-CM | POA: Insufficient documentation

## 2013-01-09 DIAGNOSIS — N891 Moderate vaginal dysplasia: Secondary | ICD-10-CM

## 2013-01-09 DIAGNOSIS — Z124 Encounter for screening for malignant neoplasm of cervix: Secondary | ICD-10-CM | POA: Insufficient documentation

## 2013-01-09 LAB — COMPREHENSIVE METABOLIC PANEL
Alkaline Phosphatase: 87 U/L (ref 39–117)
CO2: 25 mEq/L (ref 19–32)
Creat: 0.75 mg/dL (ref 0.50–1.10)
Glucose, Bld: 98 mg/dL (ref 70–99)
Sodium: 140 mEq/L (ref 135–145)
Total Bilirubin: 0.4 mg/dL (ref 0.3–1.2)

## 2013-01-09 LAB — CBC
Hemoglobin: 12.7 g/dL (ref 12.0–15.0)
MCH: 31.2 pg (ref 26.0–34.0)
MCHC: 34.5 g/dL (ref 30.0–36.0)
MCV: 90.4 fL (ref 78.0–100.0)
RBC: 4.07 MIL/uL (ref 3.87–5.11)

## 2013-01-09 LAB — LIPID PANEL
Cholesterol: 190 mg/dL (ref 0–200)
HDL: 41 mg/dL (ref >39)
LDL Cholesterol: 89 mg/dL (ref 0–99)
Total CHOL/HDL Ratio: 4.6 Ratio
Triglycerides: 302 mg/dL — ABNORMAL HIGH (ref <150)
VLDL: 60 mg/dL — ABNORMAL HIGH (ref 0–40)

## 2013-01-09 NOTE — Progress Notes (Signed)
Checked in patient for follow up with [facility]. I copied the face sheet, label and copies of insurance cards and gave all to the patient.

## 2013-01-09 NOTE — Progress Notes (Signed)
Consult Note: Gyn-Onc   Cathy Smith 47 y.o. female  Chief Complaint  Patient presents with  . f/u VAIN    Interval History: The patient returns today as previously scheduled for followup of VAIN 3. Since her last visit, she use Efudex 1 g once a week for 2 months. She says she tolerated well and had no vulvar irritation. She denies any vaginal discharge or bleeding.  HPI:47 year old [place] American female seen in consultation requested of [name], and [name] regarding management of a newly diagnosed severe dysplasia of the upper vagina. Briefly, the patient's history includes having a past history of abdominal hysterectomy for what we understand and have been fibroids. She denies any problems with Pap smears prior to the hysterectomy. More recently she has had some abnormal Pap smears ultimately resulting in colposcopy and directed biopsy on 04/07/2012. 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   Review of Systems:10 point review of systems is negative as noted above.   Vitals: Blood pressure 120/80, pulse 60, temperature 97.7 F (36.5 C), temperature source Oral, resp. rate 16, height 5\' 2"  (1.575 m), weight 152 lb (68.947 kg).  Physical Exam: General : The patient is a healthy woman in no acute distress.  HEENT: normocephalic, extraoccular movements normal; neck is supple without thyromegally  Lynphnodes: Supraclavicular and inguinal nodes not enlarged  Abdomen: Soft, non-tender, no ascites, no organomegally, no masses, no hernias  Pelvic:  EGBUS: Normal female  Vagina: Normal, no lesions  Urethra and Bladder: Normal, non-tender  Cervix: Surgically absent  Uterus: Surgically absent  Bi-manual examination: Non-tender; no adenxal masses or nodularity  Rectal: normal sphincter tone, no masses, no blood   Lower extremities: No edema or varicosities. Normal range of motion   Procedure note:  Colposcopic examination of the entire vagina was performed. Begin, the patient has diffuse persistent disease. The Pap smears obtained   Assessment/Plan: Persistent vaginal dysplasia. A Pap smear is pending although on colposcopic examination this appears to be high-grade. I would like the patient continued using Efudex 1 g vaginally once a week for the next 2 months and have her return in 3 months for repeat evaluation. I explained to the patient this is particularly difficult from take care of especially in the face of her HIV infection.  No Known Allergies  Past Medical History  Diagnosis Date  . HIV infection   . Hypertension     Past Surgical History  Procedure Laterality Date  . Thyroid surgery    . Abdominal hysterectomy      Current Outpatient Prescriptions  Medication Sig Dispense Refill  . Darunavir Ethanolate (PREZISTA) 800 MG tablet Take 1 tablet (800 mg total) by mouth daily.  30 tablet  prn  . emtricitabine-tenofovir (TRUVADA) 200-300 MG per tablet Take 1 tablet by mouth daily.  30 tablet  11  . hydrochlorothiazide (HYDRODIURIL) 25 MG tablet Take 2 tablets (50 mg total) by mouth daily.  60 tablet  PRN  . raltegravir (ISENTRESS) 400 MG tablet Take 1 tablet (400 mg total) by mouth 2 (two) times daily.  60 tablet  PRN  . ritonavir (NORVIR) 100 MG TABS Take 1 tablet (100 mg total) by mouth daily with breakfast.  30 tablet  PRN  . ciprofloxacin (CIPRO) 500 MG tablet Take 1 tablet (500 mg total) by mouth 2 (two) times daily.  10 tablet  0  . fluconazole (DIFLUCAN) 100 MG tablet TAKE 1 TABLET BY MOUTH EVERY DAY  30 tablet  PRN  . fluorouracil (EFUDEX) 5 % cream Apply topically 2 (two) times daily.      Marland Kitchen sulfamethoxazole-trimethoprim (BACTRIM DS) 800-160 MG per tablet Take 1 tablet by mouth 3 (three) times a week.  12 tablet  PRN   No current facility-administered medications for this  visit.    History   Social History  . Marital Status: Married    Spouse Name: N/A    Number of Children: N/A  . Years of Education: N/A   Occupational History  . Not on file.   Social History Main Topics  . Smoking status: Never Smoker   . Smokeless tobacco: Never Used  . Alcohol Use: No  . Drug Use: No  . Sexually Active: Yes -- Female partner(s)    Birth Control/ Protection: Condom     Comment: pt. given condoms   Other Topics Concern  . Not on file   Social History Narrative  . No narrative on file    Family History  Problem Relation Age of Onset  . Cataracts Mother   . Hypertension Father       Jeannette Corpus, MD 01/09/2013, 11:08 AM

## 2013-01-09 NOTE — Addendum Note (Signed)
Addended by: Jeannette Corpus on: 01/09/2013 01:12 PM   Modules accepted: Orders

## 2013-01-09 NOTE — Patient Instructions (Signed)
Please use Efudex as instructed once a week for the next 2 months. Return to see me in 3 months.

## 2013-01-10 LAB — HIV-1 RNA QUANT-NO REFLEX-BLD: HIV-1 RNA Quant, Log: <1.3 {Log} (ref <1.30)

## 2013-01-16 ENCOUNTER — Telehealth: Payer: Self-pay | Admitting: *Deleted

## 2013-01-16 NOTE — Telephone Encounter (Signed)
Patient informed of PAP results. Efudex use stressed. RTC 04/14/13

## 2013-01-23 ENCOUNTER — Encounter: Payer: Self-pay | Admitting: Infectious Diseases

## 2013-01-23 ENCOUNTER — Ambulatory Visit (INDEPENDENT_AMBULATORY_CARE_PROVIDER_SITE_OTHER): Payer: Medicaid Other | Admitting: Infectious Diseases

## 2013-01-23 VITALS — BP 147/91 | HR 87 | Temp 97.5°F | Ht 62.5 in | Wt 154.0 lb

## 2013-01-23 DIAGNOSIS — G959 Disease of spinal cord, unspecified: Secondary | ICD-10-CM

## 2013-01-23 DIAGNOSIS — I10 Essential (primary) hypertension: Secondary | ICD-10-CM

## 2013-01-23 DIAGNOSIS — B20 Human immunodeficiency virus [HIV] disease: Secondary | ICD-10-CM | POA: Diagnosis not present

## 2013-01-23 DIAGNOSIS — R6889 Other general symptoms and signs: Secondary | ICD-10-CM

## 2013-01-23 DIAGNOSIS — E785 Hyperlipidemia, unspecified: Secondary | ICD-10-CM

## 2013-01-23 NOTE — Assessment & Plan Note (Signed)
She is doing well. Let her know that if her CD4 continues to rise, we will stop her bactrim. She is given condoms. Will recheck her Hep B S Ab.  See her back in 6 months.

## 2013-01-23 NOTE — Assessment & Plan Note (Signed)
Will continue to watch, encouraged her to exercise. She is watching diet.

## 2013-01-23 NOTE — Assessment & Plan Note (Signed)
She is doing well.

## 2013-01-23 NOTE — Progress Notes (Signed)
  Subjective:    Patient ID: Cathy Smith, female    DOB: 1966/01/19, 47 y.o.   MRN: [id number]  HPI       47 yo F with hx of HIV+ on ISS/DRVr/TRV. Last Genotype naive. Prev abn Genotypes.  On November 30, 2009 she underwent anterior cervical  decompression, C4-5, C5-6. Arthrodesis C4-C5, 7-mm structural allograft  and C5-C6, 6-mm structural allograft. Anterior instrumentation 30-mm  Synthes Vectra plate with 14 mm screws.  Also with hx of VAIN II. Has had f/u with GYN and has been using effudex weekly.  Medications have been going well. Has been married a year now, been going well. Husband is not on ART. Husband sees Dr Orvan Falconer.     HIV 1 RNA Quant (copies/mL)  Date Value  01/09/2013 <20   12/20/2012 <20   06/29/2012 <20      CD4 T Cell Abs (cmm)  Date Value  01/09/2013 200*  12/20/2012 150*  06/29/2012 180*       Review of Systems  Constitutional: Negative for appetite change and unexpected weight change.  Respiratory: Negative for cough and shortness of breath.   Gastrointestinal: Negative for diarrhea and constipation.  Genitourinary: Negative for difficulty urinating.  Musculoskeletal: Negative for back pain.       Objective:   Physical Exam  Constitutional: She appears well-developed and well-nourished.  HENT:  Mouth/Throat: No oropharyngeal exudate.  Eyes: EOM are normal. Pupils are equal, round, and reactive to light.  Neck: Neck supple.  Cardiovascular: Normal rate, regular rhythm and normal heart sounds.   Pulmonary/Chest: Effort normal and breath sounds normal.  Abdominal: Soft. Bowel sounds are normal. There is no tenderness.  Lymphadenopathy:    She has no cervical adenopathy.            Assessment & Plan:

## 2013-01-23 NOTE — Assessment & Plan Note (Signed)
Has elevated trig. Hopefully will improve with diet and exercise.Marland KitchenMarland KitchenMarland Kitchen

## 2013-01-23 NOTE — Assessment & Plan Note (Signed)
Greatly appreciate GYN f/u. She appears to be tolerating effudex well.

## 2013-03-28 ENCOUNTER — Other Ambulatory Visit: Payer: Self-pay | Admitting: *Deleted

## 2013-03-28 DIAGNOSIS — I10 Essential (primary) hypertension: Secondary | ICD-10-CM

## 2013-03-28 MED ORDER — HYDROCHLOROTHIAZIDE 25 MG PO TABS: 50.0000 mg | ORAL_TABLET | Freq: Every day | ORAL | Status: DC

## 2013-04-14 ENCOUNTER — Telehealth: Payer: Self-pay | Admitting: Gynecology

## 2013-04-14 ENCOUNTER — Encounter: Payer: Self-pay | Admitting: Gynecology

## 2013-04-14 ENCOUNTER — Ambulatory Visit: Payer: Medicare Other | Attending: Gynecology | Admitting: Gynecology

## 2013-04-14 ENCOUNTER — Other Ambulatory Visit (HOSPITAL_COMMUNITY)
Admission: RE | Admit: 2013-04-14 | Discharge: 2013-04-14 | Disposition: A | Discharge status: Home / Self Care | Payer: Medicare Other | Source: Ambulatory Visit | Attending: Gynecology | Admitting: Gynecology

## 2013-04-14 VITALS — BP 154/98 | HR 97 | Temp 98.3°F | Resp 20 | Ht 62.0 in | Wt 153.5 lb

## 2013-04-14 DIAGNOSIS — N891 Moderate vaginal dysplasia: Secondary | ICD-10-CM

## 2013-04-14 DIAGNOSIS — Z90710 Acquired absence of both cervix and uterus: Secondary | ICD-10-CM | POA: Insufficient documentation

## 2013-04-14 DIAGNOSIS — Z124 Encounter for screening for malignant neoplasm of cervix: Secondary | ICD-10-CM | POA: Insufficient documentation

## 2013-04-14 DIAGNOSIS — Z79899 Other long term (current) drug therapy: Secondary | ICD-10-CM | POA: Diagnosis not present

## 2013-04-14 DIAGNOSIS — N893 Dysplasia of vagina, unspecified: Secondary | ICD-10-CM | POA: Insufficient documentation

## 2013-04-14 DIAGNOSIS — D069 Carcinoma in situ of cervix, unspecified: Secondary | ICD-10-CM | POA: Diagnosis not present

## 2013-04-14 DIAGNOSIS — B20 Human immunodeficiency virus [HIV] disease: Secondary | ICD-10-CM | POA: Diagnosis not present

## 2013-04-14 DIAGNOSIS — I10 Essential (primary) hypertension: Secondary | ICD-10-CM | POA: Insufficient documentation

## 2013-04-14 NOTE — Patient Instructions (Signed)
Continue using Efudex cream one night a week. Use Premarin cream twice a week. Return to see me in 3 months

## 2013-04-14 NOTE — Addendum Note (Signed)
Addended by: Warner Mccreedy D on: 04/14/2013 03:47 PM   Modules accepted: Orders

## 2013-04-14 NOTE — Telephone Encounter (Signed)
, °

## 2013-04-14 NOTE — Progress Notes (Signed)
Consult Note: Gyn-Onc   Cathy Smith 47 y.o. female  Chief Complaint  Patient presents with  . VAIN II    Follow up   Assessment: VA IN.  Plan: We will continue Efudex 1 date a week and Premarin cream twice a week. A Pap smear is pending the patient return to see me in 3 months  Interval History: The patient returns today as previously scheduled for followup of VAIN 3. Since her last visit, she use Efudex 1 g once a week for 3 months. She is using Premarin cream twice a week She says she tolerated well and had no vulvar irritation. She denies any vaginal discharge or bleeding. It does recall that her last Pap smear showed only VAIN 61.  HPI:47 year old [place] American female seen in consultation requested of [name], and [name] regarding management of a newly diagnosed severe dysplasia of the upper vagina. Briefly, the patient's history includes having a past history of abdominal hysterectomy for what we understand and have been fibroids. She denies any problems with Pap smears prior to the hysterectomy. More recently she has had some abnormal Pap smears ultimately resulting in colposcopy and directed biopsy on 04/07/2012. 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   Review of Systems:10 point review of systems is negative as noted above.   Vitals: Blood pressure 154/98, pulse 97, temperature 98.3 F (36.8 C), temperature source Oral, resp. rate 20, height 5\' 2"  (1.575 m), weight 153 lb 8 oz (69.627 kg).  Physical Exam: General : The patient is a healthy woman in no acute distress.  HEENT: normocephalic, extraoccular movements normal; neck is supple without thyromegally  Lynphnodes: Supraclavicular and inguinal nodes not enlarged  Abdomen: Soft, non-tender, no ascites, no organomegally, no masses, no hernias   Pelvic:  EGBUS: Normal female there are areas of white epithelium on the vulva consistent with VIN Vagina: Normal, no lesions  Urethra and Bladder: Normal, non-tender  Cervix: Surgically absent  Uterus: Surgically absent  Bi-manual examination: Non-tender; no adenxal masses or nodularity  Rectal: normal sphincter tone, no masses, no blood  Lower extremities: No edema or varicosities. Normal range of motion   Procedure note:  Colposcopic examination of the entire vagina was performed. There are much less lesions on exam today.. The Pap smears obtained     No Known Allergies  Past Medical History  Diagnosis Date  . HIV infection   . Hypertension     Past Surgical History  Procedure Laterality Date  . Thyroid surgery    . Abdominal hysterectomy      Current Outpatient Prescriptions  Medication Sig Dispense Refill  . Darunavir Ethanolate (PREZISTA) 800 MG tablet Take 1 tablet (800 mg total) by mouth daily.  30 tablet  prn  . emtricitabine-tenofovir (TRUVADA) 200-300 MG per tablet Take 1 tablet by mouth daily.  30 tablet  11  . fluorouracil (EFUDEX) 5 % cream Apply topically 2 (two) times daily.      . hydrochlorothiazide (HYDRODIURIL) 25 MG tablet Take 2 tablets (50 mg total) by mouth daily.  60 tablet  6  . raltegravir (ISENTRESS) 400 MG tablet Take 1 tablet (400 mg total) by mouth 2 (two) times daily.  60 tablet  PRN  . ritonavir (NORVIR) 100 MG TABS Take 1 tablet (100 mg total) by mouth daily with breakfast.  30 tablet  PRN  .  sulfamethoxazole-trimethoprim (BACTRIM DS) 800-160 MG per tablet Take 1 tablet by mouth 3 (three) times a week.  12 tablet  PRN   No current facility-administered medications for this visit.    History   Social History  . Marital Status: Married    Spouse Name: N/A    Number of Children: N/A  . Years of Education: N/A   Occupational History  . Not on file.   Social History Main Topics  . Smoking status: Never Smoker   . Smokeless  tobacco: Never Used  . Alcohol Use: No  . Drug Use: No  . Sexually Active: Yes -- Female partner(s)    Birth Control/ Protection: Condom     Comment: pt. given condoms   Other Topics Concern  . Not on file   Social History Narrative  . No narrative on file    Family History  Problem Relation Age of Onset  . Cataracts Mother   . Hypertension Father       Jeannette Corpus, MD 04/14/2013, 11:38 AM

## 2013-04-26 ENCOUNTER — Telehealth: Payer: Self-pay | Admitting: Gynecologic Oncology

## 2013-04-26 NOTE — Telephone Encounter (Signed)
Message left about pap smear and Dr. Nelwyn Salisbury recommendations to continue use of Effudex and follow up as scheduled in August.  Instructed to call for any questions or concerns.

## 2013-04-27 ENCOUNTER — Telehealth: Payer: Self-pay | Admitting: Gynecologic Oncology

## 2013-04-27 NOTE — Telephone Encounter (Signed)
Patient returned call to the office.  Informed of pap smear results and the recommendations to continue Efudex use.  Follow up appointment arranged for 07/07/13.  Instructed to call for any questions or concerns.

## 2013-06-16 ENCOUNTER — Other Ambulatory Visit: Payer: Self-pay | Admitting: Infectious Diseases

## 2013-06-16 ENCOUNTER — Other Ambulatory Visit: Payer: Self-pay | Admitting: Internal Medicine

## 2013-06-30 ENCOUNTER — Ambulatory Visit: Payer: Medicare Other | Attending: Gynecology | Admitting: Gynecology

## 2013-06-30 ENCOUNTER — Other Ambulatory Visit (HOSPITAL_COMMUNITY)
Admission: RE | Admit: 2013-06-30 | Discharge: 2013-06-30 | Disposition: A | Discharge status: Home / Self Care | Payer: Medicare Other | Source: Ambulatory Visit | Attending: Gynecology | Admitting: Gynecology

## 2013-06-30 ENCOUNTER — Encounter: Payer: Self-pay | Admitting: Gynecology

## 2013-06-30 VITALS — BP 138/80 | HR 68 | Temp 98.3°F | Resp 16 | Ht 61.0 in | Wt 152.8 lb

## 2013-06-30 DIAGNOSIS — R87622 Low grade squamous intraepithelial lesion on cytologic smear of vagina (LGSIL): Secondary | ICD-10-CM | POA: Diagnosis not present

## 2013-06-30 DIAGNOSIS — Z90710 Acquired absence of both cervix and uterus: Secondary | ICD-10-CM | POA: Insufficient documentation

## 2013-06-30 DIAGNOSIS — Z124 Encounter for screening for malignant neoplasm of cervix: Secondary | ICD-10-CM | POA: Diagnosis not present

## 2013-06-30 DIAGNOSIS — D072 Carcinoma in situ of vagina: Secondary | ICD-10-CM | POA: Diagnosis not present

## 2013-06-30 DIAGNOSIS — N891 Moderate vaginal dysplasia: Secondary | ICD-10-CM

## 2013-06-30 NOTE — Patient Instructions (Addendum)
We will contact you with the results of your pap smear.  We will plan to see you in six months.  Please call the office in Nov. Or Dec. 2014 to schedule an appointment for Feb. 2015.

## 2013-06-30 NOTE — Progress Notes (Signed)
Consult Note: Gyn-Onc   Cathy Smith 47 y.o. female  Chief Complaint  Patient presents with  . VAIN II    Follow up   Assessment: VA IN. HIV   Plan:  Pap smear is obtained. If it is no worse than low-grade SIL we'll have the patient return in 6 months.  Interval History Patient returns today as previously scheduled for followup. Since her last visit she's done well. She denies any GI or GU symptoms. She has no vulvar symptoms. She denies any vaginal bleeding or discharge. Overall she's done well. It's noted that her last Pap smear in June showed low-grade SIL only.  HPI:47 year old [place] female seen in consultation requested of [name], and [name] regarding management of a newly diagnosed severe dysplasia of the upper vagina. Briefly, the patient's history includes having a past history of abdominal hysterectomy for what we understand and have been fibroids. She denies any problems with Pap smears prior to the hysterectomy. More recently she has had some abnormal Pap smears ultimately resulting in colposcopy and directed biopsy on 04/07/2012. 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   Review of Systems:10 point review of systems is negative as noted above.   Vitals: Blood pressure 138/80, pulse 68, temperature 98.3 F (36.8 C), temperature source Oral, resp. rate 16, height 5\' 1"  (1.549 m), weight 152 lb 12.8 oz (69.31 kg).  Physical Exam: General : The patient is a healthy woman in no acute distress.  HEENT: normocephalic, extraoccular movements normal; neck is supple without thyromegally  Lynphnodes: Supraclavicular and inguinal nodes not enlarged  Abdomen: Soft, non-tender, no ascites, no organomegally, no masses, no hernias  Pelvic:  EGBUS: Normal female there are areas of white  epithelium on the vulva consistent with VIN Vagina: Normal, no lesions  Urethra and Bladder: Normal, non-tender  Cervix: Surgically absent  Uterus: Surgically absent  Bi-manual examination: Non-tender; no adenxal masses or nodularity  Rectal: normal sphincter tone, no masses, no blood  Lower extremities: No edema or varicosities. Normal range of motion    Pap smears are obtained     No Known Allergies  Past Medical History  Diagnosis Date  . HIV infection   . Hypertension     Past Surgical History  Procedure Laterality Date  . Thyroid surgery    . Abdominal hysterectomy      Current Outpatient Prescriptions  Medication Sig Dispense Refill  . fluorouracil (EFUDEX) 5 % cream Apply topically 2 (two) times daily.      . hydrochlorothiazide (HYDRODIURIL) 25 MG tablet Take 2 tablets (50 mg total) by mouth daily.  60 tablet  6  . ISENTRESS 400 MG tablet TAKE ONE TABLET BY MOUTH TWICE DAILY  60 tablet  3  . PREZISTA 800 MG tablet TAKE 1 TABLET BY MOUTH DAILY  30 tablet  3  . sulfamethoxazole-trimethoprim (BACTRIM DS) 800-160 MG per tablet TAKE 1 TABLET BY MOUTH AS DIRECTED  30 tablet  3  . NORVIR 100 MG TABS tablet TAKE 1 TABLET BY MOUTH EVERY DAY WITH BREAKFAST  30 tablet  3  . PREZISTA 800 MG tablet TAKE 1 TABLET BY MOUTH DAILY  30 tablet  3  . TRUVADA 200-300 MG per tablet TAKE 1 TABLET BY MOUTH DAILY  30 tablet  3   No current facility-administered medications for this visit.  History   Social History  . Marital Status: Married    Spouse Name: N/A    Number of Children: N/A  . Years of Education: N/A   Occupational History  . Not on file.   Social History Main Topics  . Smoking status: Never Smoker   . Smokeless tobacco: Never Used  . Alcohol Use: No  . Drug Use: No  . Sexual Activity: Yes    Partners: Male    Birth Control/ Protection: Condom     Comment: pt. given condoms   Other Topics Concern  . Not on file   Social History Narrative  . No  narrative on file    Family History  Problem Relation Age of Onset  . Cataracts Mother   . Hypertension Father       Cathy Corpus, MD 06/30/2013, 2:53 PM

## 2013-07-07 ENCOUNTER — Ambulatory Visit: Payer: Medicare Other | Admitting: Gynecology

## 2013-07-14 ENCOUNTER — Other Ambulatory Visit: Payer: Self-pay | Admitting: Infectious Diseases

## 2013-07-14 ENCOUNTER — Telehealth: Payer: Self-pay | Admitting: Gynecologic Oncology

## 2013-07-14 NOTE — Telephone Encounter (Signed)
Patient notified of pap smear results and Dr. Nelwyn Salisbury recommendations for repeat pap in six months at her next visit.  She does not have to continue use of premarin and effudex cream at this time.

## 2013-07-19 ENCOUNTER — Other Ambulatory Visit: Payer: Medicare Other

## 2013-07-19 DIAGNOSIS — B20 Human immunodeficiency virus [HIV] disease: Secondary | ICD-10-CM

## 2013-07-19 LAB — COMPREHENSIVE METABOLIC PANEL
ALT: 21 U/L (ref 0–35)
Albumin: 4.2 g/dL (ref 3.5–5.2)
CO2: 26 mEq/L (ref 19–32)
Calcium: 9.4 mg/dL (ref 8.4–10.5)
Chloride: 102 mEq/L (ref 96–112)
Glucose, Bld: 94 mg/dL (ref 70–99)
Sodium: 138 mEq/L (ref 135–145)
Total Protein: 7.8 g/dL (ref 6.0–8.3)

## 2013-07-19 LAB — HEPATITIS B SURFACE ANTIBODY,QUALITATIVE: Hep B S Ab: NEGATIVE

## 2013-07-19 LAB — CBC
Hemoglobin: 12.6 g/dL (ref 12.0–15.0)
Platelets: 187 10*3/uL (ref 150–400)
RBC: 3.94 MIL/uL (ref 3.87–5.11)
WBC: 3.2 10*3/uL — ABNORMAL LOW (ref 4.0–10.5)

## 2013-07-20 LAB — HIV-1 RNA QUANT-NO REFLEX-BLD: HIV 1 RNA Quant: <20 copies/mL (ref <20)

## 2013-08-02 ENCOUNTER — Encounter: Payer: Self-pay | Admitting: Infectious Diseases

## 2013-08-02 ENCOUNTER — Ambulatory Visit (INDEPENDENT_AMBULATORY_CARE_PROVIDER_SITE_OTHER): Payer: Medicare Other | Admitting: Infectious Diseases

## 2013-08-02 VITALS — BP 142/96 | HR 83 | Temp 98.3°F | Wt 155.0 lb

## 2013-08-02 DIAGNOSIS — Z23 Encounter for immunization: Secondary | ICD-10-CM

## 2013-08-02 DIAGNOSIS — B20 Human immunodeficiency virus [HIV] disease: Secondary | ICD-10-CM

## 2013-08-02 DIAGNOSIS — IMO0002 Reserved for concepts with insufficient information to code with codable children: Secondary | ICD-10-CM

## 2013-08-02 DIAGNOSIS — R6889 Other general symptoms and signs: Secondary | ICD-10-CM

## 2013-08-02 DIAGNOSIS — G959 Disease of spinal cord, unspecified: Secondary | ICD-10-CM | POA: Diagnosis not present

## 2013-08-02 NOTE — Assessment & Plan Note (Signed)
She is doing well, encouraged her to take her art. Has gotten flu shot. Has condoms at home. Will see her back in 6 months.

## 2013-08-02 NOTE — Progress Notes (Signed)
  Subjective:    Patient ID: Cathy Smith, female    DOB: 11/02/1966, 47 y.o.   MRN: [id number]  HPI   47 yo F with hx of HIV+ on ISS/DRVr/TRV. Last Genotype naive. Prev abn Genotypes.  On November 30, 2009 she underwent anterior cervical  decompression, C4-5, C5-6. Arthrodesis C4-C5, 7-mm structural allograft  and C5-C6, 6-mm structural allograft. Anterior instrumentation 30-mm  Synthes Vectra plate with 14 mm screws.  Also with hx of VAIN II. Has had f/u with GYN and has been using effudex weekly.  Husband is HIV+, sees Dr Orvan Falconer (on ART). Married 2 years Dec 2014. Going to beach.  Feeling well. No problems with medications.   HIV 1 RNA Quant (copies/mL)  Date Value  07/19/2013 <20   01/09/2013 <20   12/20/2012 <20      CD4 T Cell Abs (/uL)  Date Value  07/19/2013 200*  01/09/2013 200*  12/20/2012 150*    Review of Systems  Constitutional: Negative for appetite change and unexpected weight change.  Gastrointestinal: Negative for diarrhea and constipation.  Genitourinary: Negative for dysuria.  ammenorheic, occas hot flashes.      Objective:   Physical Exam  Constitutional: She appears well-developed and well-nourished.  HENT:  Mouth/Throat: No oropharyngeal exudate.  Eyes: EOM are normal. Pupils are equal, round, and reactive to light.  Neck: Neck supple.  Cardiovascular: Normal rate, regular rhythm and normal heart sounds.   Pulmonary/Chest: Effort normal and breath sounds normal.  Abdominal: Soft. Bowel sounds are normal. She exhibits no distension.  Lymphadenopathy:    She has no cervical adenopathy.          Assessment & Plan:

## 2013-08-02 NOTE — Assessment & Plan Note (Signed)
No problems 

## 2013-08-02 NOTE — Assessment & Plan Note (Signed)
Has seen GYN 8-22. They will call her back for next appt.

## 2013-08-11 ENCOUNTER — Other Ambulatory Visit: Payer: Self-pay | Admitting: Infectious Diseases

## 2013-08-11 DIAGNOSIS — B20 Human immunodeficiency virus [HIV] disease: Secondary | ICD-10-CM

## 2013-10-09 ENCOUNTER — Other Ambulatory Visit: Payer: Self-pay | Admitting: Infectious Diseases

## 2013-10-19 ENCOUNTER — Other Ambulatory Visit: Payer: Self-pay | Admitting: Infectious Diseases

## 2013-10-19 ENCOUNTER — Other Ambulatory Visit: Payer: Self-pay | Admitting: Gynecology

## 2013-10-19 DIAGNOSIS — Z1231 Encounter for screening mammogram for malignant neoplasm of breast: Secondary | ICD-10-CM

## 2013-10-30 ENCOUNTER — Other Ambulatory Visit: Payer: Self-pay | Admitting: *Deleted

## 2013-10-30 DIAGNOSIS — I10 Essential (primary) hypertension: Secondary | ICD-10-CM

## 2013-10-30 MED ORDER — HYDROCHLOROTHIAZIDE 25 MG PO TABS: ORAL_TABLET | ORAL | Status: DC

## 2013-10-30 NOTE — Telephone Encounter (Signed)
Changed Rx to a 30 day supply per the pharmacy call.

## 2013-11-22 ENCOUNTER — Ambulatory Visit (HOSPITAL_COMMUNITY): Payer: Medicare Other

## 2013-12-01 ENCOUNTER — Ambulatory Visit (HOSPITAL_COMMUNITY)
Admission: RE | Admit: 2013-12-01 | Discharge: 2013-12-01 | Disposition: A | Discharge status: Home / Self Care | Payer: Medicare Other | Source: Ambulatory Visit | Attending: Infectious Diseases | Admitting: Infectious Diseases

## 2013-12-01 DIAGNOSIS — Z1231 Encounter for screening mammogram for malignant neoplasm of breast: Secondary | ICD-10-CM | POA: Insufficient documentation

## 2013-12-14 ENCOUNTER — Telehealth: Payer: Self-pay | Admitting: *Deleted

## 2013-12-14 NOTE — Telephone Encounter (Signed)
Pt called to verify next appt. Discussed with pt next follow up with dr. Stanford Smith is on 01/05/14 at 11:45. Requested pt arrive at 11:15 for registration and check in. Pt verbalized understanding.

## 2013-12-28 ENCOUNTER — Other Ambulatory Visit: Payer: Self-pay | Admitting: Internal Medicine

## 2013-12-28 ENCOUNTER — Telehealth: Payer: Self-pay | Admitting: *Deleted

## 2013-12-28 NOTE — Telephone Encounter (Signed)
RN reviewed pt record.  At March 2014 OV Dr. Ninetta LightsHatcher discontinued Septra.  MD please advise about Septra DS rx.  [facility], Lorre MunroeJonas, requesting call back.

## 2013-12-29 ENCOUNTER — Other Ambulatory Visit: Payer: Self-pay | Admitting: Internal Medicine

## 2013-12-29 NOTE — Addendum Note (Signed)
Addended by: Jennet MaduroESTRIDGE, DENISE D on: 12/29/2013 12:38 PM   Modules accepted: Orders, Medications

## 2013-12-29 NOTE — Telephone Encounter (Signed)
Pt's CD4 200, twice.  Stop bactrim please

## 2013-12-29 NOTE — Telephone Encounter (Signed)
[facility] notified to discontinue Bactrim DS rx.  Pharmacist verbalized back this order.

## 2014-01-05 ENCOUNTER — Ambulatory Visit: Payer: Medicare Other | Admitting: Gynecology

## 2014-01-23 ENCOUNTER — Ambulatory Visit: Payer: Medicare Other | Admitting: Gynecology

## 2014-01-24 ENCOUNTER — Other Ambulatory Visit: Payer: Medicare Other

## 2014-01-24 ENCOUNTER — Other Ambulatory Visit (HOSPITAL_COMMUNITY)
Admission: RE | Admit: 2014-01-24 | Discharge: 2014-01-24 | Disposition: A | Discharge status: Home / Self Care | Payer: Medicare Other | Source: Ambulatory Visit | Attending: Infectious Diseases | Admitting: Infectious Diseases

## 2014-01-24 DIAGNOSIS — B20 Human immunodeficiency virus [HIV] disease: Secondary | ICD-10-CM

## 2014-01-24 DIAGNOSIS — Z113 Encounter for screening for infections with a predominantly sexual mode of transmission: Secondary | ICD-10-CM | POA: Diagnosis not present

## 2014-01-24 DIAGNOSIS — Z79899 Other long term (current) drug therapy: Secondary | ICD-10-CM

## 2014-01-24 LAB — LIPID PANEL
CHOL/HDL RATIO: 7.6 ratio
CHOLESTEROL: 198 mg/dL (ref 0–200)
HDL: 26 mg/dL — ABNORMAL LOW (ref >39)
TRIGLYCERIDES: 832 mg/dL — AB (ref <150)

## 2014-01-24 LAB — COMPREHENSIVE METABOLIC PANEL
ALT: 33 U/L (ref 0–35)
AST: 27 U/L (ref 0–37)
Albumin: 4.3 g/dL (ref 3.5–5.2)
Alkaline Phosphatase: 71 U/L (ref 39–117)
BILIRUBIN TOTAL: 0.3 mg/dL (ref 0.2–1.2)
BUN: 11 mg/dL (ref 6–23)
CO2: 29 meq/L (ref 19–32)
Calcium: 9.3 mg/dL (ref 8.4–10.5)
Chloride: 99 mEq/L (ref 96–112)
Creat: 0.85 mg/dL (ref 0.50–1.10)
GLUCOSE: 101 mg/dL — AB (ref 70–99)
Potassium: 3.8 mEq/L (ref 3.5–5.3)
Sodium: 135 mEq/L (ref 135–145)
TOTAL PROTEIN: 7.9 g/dL (ref 6.0–8.3)

## 2014-01-24 LAB — CBC WITH DIFFERENTIAL/PLATELET
Basophils Absolute: 0 K/uL (ref 0.0–0.1)
Basophils Relative: 1 % (ref 0–1)
Eosinophils Absolute: 0.1 K/uL (ref 0.0–0.7)
Eosinophils Relative: 2 % (ref 0–5)
HCT: 35.7 % — ABNORMAL LOW (ref 36.0–46.0)
Hemoglobin: 12.6 g/dL (ref 12.0–15.0)
Lymphocytes Relative: 48 % — ABNORMAL HIGH (ref 12–46)
Lymphs Abs: 1.5 K/uL (ref 0.7–4.0)
MCH: 32.3 pg (ref 26.0–34.0)
MCHC: 35.3 g/dL (ref 30.0–36.0)
MCV: 91.5 fL (ref 78.0–100.0)
Monocytes Absolute: 0.3 K/uL (ref 0.1–1.0)
Monocytes Relative: 11 % (ref 3–12)
Neutro Abs: 1.2 K/uL — ABNORMAL LOW (ref 1.7–7.7)
Neutrophils Relative %: 38 % — ABNORMAL LOW (ref 43–77)
Platelets: 190 K/uL (ref 150–400)
RBC: 3.9 MIL/uL (ref 3.87–5.11)
RDW: 12.8 % (ref 11.5–15.5)
WBC: 3.1 K/uL — ABNORMAL LOW (ref 4.0–10.5)

## 2014-01-25 LAB — HIV-1 RNA QUANT-NO REFLEX-BLD: HIV 1 RNA Quant: <20 copies/mL (ref <20)

## 2014-01-25 LAB — T-HELPER CELL (CD4) - (RCID CLINIC ONLY)
CD4 % Helper T Cell: 15 % — ABNORMAL LOW (ref 33–55)
CD4 T CELL ABS: 210 /uL — AB (ref 400–2700)

## 2014-01-25 LAB — RPR

## 2014-01-25 LAB — URINE CYTOLOGY ANCILLARY ONLY
Chlamydia: NEGATIVE
Neisseria Gonorrhea: NEGATIVE

## 2014-01-29 ENCOUNTER — Ambulatory Visit: Payer: Medicare Other | Attending: Gynecology | Admitting: Gynecology

## 2014-01-29 ENCOUNTER — Other Ambulatory Visit (HOSPITAL_COMMUNITY)
Admission: RE | Admit: 2014-01-29 | Discharge: 2014-01-29 | Disposition: A | Discharge status: Home / Self Care | Payer: Medicare Other | Source: Ambulatory Visit | Attending: Gynecology | Admitting: Gynecology

## 2014-01-29 ENCOUNTER — Encounter: Payer: Self-pay | Admitting: Gynecology

## 2014-01-29 VITALS — BP 142/88 | HR 72 | Temp 98.1°F | Resp 16 | Wt 151.6 lb

## 2014-01-29 DIAGNOSIS — D0730 Carcinoma in situ of unspecified female genital organs: Secondary | ICD-10-CM | POA: Diagnosis not present

## 2014-01-29 DIAGNOSIS — Z79899 Other long term (current) drug therapy: Secondary | ICD-10-CM | POA: Insufficient documentation

## 2014-01-29 DIAGNOSIS — Z01419 Encounter for gynecological examination (general) (routine) without abnormal findings: Secondary | ICD-10-CM | POA: Insufficient documentation

## 2014-01-29 DIAGNOSIS — I10 Essential (primary) hypertension: Secondary | ICD-10-CM | POA: Diagnosis not present

## 2014-01-29 DIAGNOSIS — B20 Human immunodeficiency virus [HIV] disease: Secondary | ICD-10-CM | POA: Diagnosis not present

## 2014-01-29 DIAGNOSIS — N893 Dysplasia of vagina, unspecified: Secondary | ICD-10-CM | POA: Diagnosis not present

## 2014-01-29 DIAGNOSIS — N891 Moderate vaginal dysplasia: Secondary | ICD-10-CM

## 2014-01-29 NOTE — Patient Instructions (Signed)
Return to see us in 6 months. 

## 2014-01-29 NOTE — Progress Notes (Signed)
Consult Note: Gyn-Onc   Cathy Smith 48 y.o. female  Chief Complaint  Patient presents with  . Vain II   Assessment: History VAIN 3. Immunosuppression with HIV   Plan:  Pap smear is obtained. If it is no worse than low-grade SIL we'll have the patient return in 6 months.  Interval History Patient returns today as previously scheduled for followup. Since her last visit she's done well. She denies any GI or GU symptoms. She has no vulvar symptoms. She denies any vaginal bleeding or discharge. Overall she's done well. It's noted that her last Pap smear  showed low-grade SIL only.  HPI:48 year old [place] female seen in consultation requested of [name], and Dr. Marice Potterove regarding management of a newly diagnosed severe dysplasia of the upper vagina. Briefly, the patient's history includes having a past history of abdominal hysterectomy for what we understand and have been fibroids. She denies any problems with Pap smears prior to the hysterectomy. More recently she has had some abnormal Pap smears ultimately resulting in colposcopy and directed biopsy on 04/07/2012. 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   Review of Systems:10 point review of systems is negative as noted above.   Vitals: Blood pressure 142/88, pulse 72, temperature 98.1 F (36.7 C), temperature source Oral, resp. rate 16, weight 151 lb 9.6 oz (68.765 kg).  Physical Exam: General : The patient is a healthy woman in no acute distress.  HEENT: normocephalic, extraoccular movements normal; neck is supple without thyromegally  Lynphnodes: Supraclavicular and inguinal nodes not enlarged  Abdomen: Soft, non-tender, no ascites, no organomegally, no masses, no hernias  Pelvic:  EGBUS: Normal female there are areas of white epithelium on the  vulva consistent with VIN Vagina: Normal, no lesions  Urethra and Bladder: Normal, non-tender  Cervix: Surgically absent  Uterus: Surgically absent  Bi-manual examination: Non-tender; no adenxal masses or nodularity  Rectal: normal sphincter tone, no masses, no blood  Lower extremities: No edema or varicosities. Normal range of motion    Pap smears are obtained     No Known Allergies  Past Medical History  Diagnosis Date  . HIV infection   . Hypertension     Past Surgical History  Procedure Laterality Date  . Thyroid surgery    . Abdominal hysterectomy      Current Outpatient Prescriptions  Medication Sig Dispense Refill  . fluorouracil (EFUDEX) 5 % cream Apply topically 2 (two) times daily.      . hydrochlorothiazide (HYDRODIURIL) 25 MG tablet TAKE 2 TABLETS BY MOUTH EVERY DAY  60 tablet  2  . ISENTRESS 400 MG tablet TAKE ONE TABLET BY MOUTH TWICE DAILY  60 tablet  6  . NORVIR 100 MG TABS tablet TAKE 1 TABLET BY MOUTH EVERY DAY WITH BREAKFAST  30 tablet  6  . PREZISTA 800 MG tablet TAKE 1 TABLET BY MOUTH DAILY  30 tablet  6  . TRUVADA 200-300 MG per tablet TAKE 1 TABLET BY MOUTH DAILY  30 tablet  6   No current facility-administered medications for this visit.    History   Social History  . Marital Status: Married    Spouse Name: N/A    Number of Children: N/A  . Years of Education: N/A   Occupational History  . Not on file.   Social History Main Topics  . Smoking status:  Never Smoker   . Smokeless tobacco: Never Used  . Alcohol Use: No  . Drug Use: No  . Sexual Activity: Yes    Partners: Male    Birth Control/ Protection: Condom     Comment: pt. given condoms   Other Topics Concern  . Not on file   Social History Narrative  . No narrative on file    Family History  Problem Relation Age of Onset  . Cataracts Mother   . Hypertension Father       Jeannette Corpus, MD 01/29/2014, 3:39 PM

## 2014-02-05 ENCOUNTER — Encounter: Payer: Self-pay | Admitting: Infectious Diseases

## 2014-02-05 ENCOUNTER — Ambulatory Visit (INDEPENDENT_AMBULATORY_CARE_PROVIDER_SITE_OTHER): Payer: Medicare Other | Admitting: Infectious Diseases

## 2014-02-05 VITALS — BP 121/80 | HR 88 | Temp 98.2°F | Ht 62.0 in | Wt 157.0 lb

## 2014-02-05 DIAGNOSIS — B20 Human immunodeficiency virus [HIV] disease: Secondary | ICD-10-CM | POA: Diagnosis not present

## 2014-02-05 DIAGNOSIS — Z23 Encounter for immunization: Secondary | ICD-10-CM

## 2014-02-05 DIAGNOSIS — R87619 Unspecified abnormal cytological findings in specimens from cervix uteri: Secondary | ICD-10-CM | POA: Diagnosis not present

## 2014-02-05 DIAGNOSIS — E785 Hyperlipidemia, unspecified: Secondary | ICD-10-CM | POA: Diagnosis not present

## 2014-02-05 NOTE — Progress Notes (Signed)
   Subjective:    Patient ID: Altamese DillingRenee C Aldava, female    DOB: 07/30/1966, 48 y.o.   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 Last Genotype naive. Prev abn Genotypes.  On November 30, 2009 she underwent anterior cervical  decompression, C4-5, C5-6. Arthrodesis C4-C5, 7-mm structural allograft  and C5-C6, 6-mm structural allograft. Anterior instrumentation 30-mm  Synthes Vectra plate with 14 mm screws.  Also with hx of VAIN II. Has had f/u with GYN and has been using effudex weekly.   Husband is HIV+, sees Dr Orvan Falconerampbell (on ART). Married since Dec 2014. Trig 832.  Hep B SAb (-) 2014.  Feeling well. No problems with neck pain. No problems with neck pain. Last GYN f/u was 01-2014.    HIV 1 RNA Quant (copies/mL)  Date Value  01/24/2014 <20   07/19/2013 <20   01/09/2013 <20      CD4 T Cell Abs (/uL)  Date Value  01/24/2014 210*  07/19/2013 200*  01/09/2013 200*   ammenorheic  Review of Systems  Constitutional: Negative for appetite change and unexpected weight change.  Gastrointestinal: Negative for diarrhea and constipation.  Genitourinary: Negative for difficulty urinating and menstrual problem.       Objective:   Physical Exam  Constitutional: She appears well-developed and well-nourished.  HENT:  Mouth/Throat: No oropharyngeal exudate.  Eyes: EOM are normal. Pupils are equal, round, and reactive to light.  Neck: Neck supple.  Cardiovascular: Normal rate, regular rhythm and normal heart sounds.   Pulmonary/Chest: Effort normal and breath sounds normal.  Abdominal: Soft. Bowel sounds are normal. She exhibits no distension. There is no tenderness.  Lymphadenopathy:    She has no cervical adenopathy.          Assessment & Plan:

## 2014-02-05 NOTE — Assessment & Plan Note (Signed)
Her lipds have gone up dramatically. Will repeat at her next visit, if still elevated witll start fibrate (explained to pt).

## 2014-02-05 NOTE — Assessment & Plan Note (Signed)
Greatly appreciate GYN f/u.  

## 2014-02-05 NOTE — Addendum Note (Signed)
Addended by: Wendall MolaOCKERHAM, JACQUELINE A on: 02/05/2014 11:59 AM   Modules accepted: Orders

## 2014-02-05 NOTE — Assessment & Plan Note (Signed)
She is doing well on a salvage regimen. Will continue her on this. She restarts the Hep B series today. Will see her back in 6 months.

## 2014-02-06 ENCOUNTER — Telehealth: Payer: Self-pay | Admitting: *Deleted

## 2014-02-06 NOTE — Telephone Encounter (Signed)
Per MD, scheduled pt to come in for Colposcopy Apr 3. Called pt notified MD to see her to discuss Pap smear results. Pt verbalized understanding.

## 2014-02-09 ENCOUNTER — Ambulatory Visit: Payer: Medicare Other | Attending: Gynecology | Admitting: Gynecology

## 2014-02-09 ENCOUNTER — Encounter: Payer: Self-pay | Admitting: Gynecology

## 2014-02-09 VITALS — BP 146/89 | HR 78 | Temp 97.9°F | Resp 18 | Ht 60.24 in | Wt 153.2 lb

## 2014-02-09 DIAGNOSIS — B20 Human immunodeficiency virus [HIV] disease: Secondary | ICD-10-CM | POA: Diagnosis not present

## 2014-02-09 DIAGNOSIS — Z79899 Other long term (current) drug therapy: Secondary | ICD-10-CM | POA: Insufficient documentation

## 2014-02-09 DIAGNOSIS — I10 Essential (primary) hypertension: Secondary | ICD-10-CM | POA: Diagnosis not present

## 2014-02-09 DIAGNOSIS — D072 Carcinoma in situ of vagina: Secondary | ICD-10-CM | POA: Diagnosis not present

## 2014-02-09 DIAGNOSIS — Z8249 Family history of ischemic heart disease and other diseases of the circulatory system: Secondary | ICD-10-CM | POA: Insufficient documentation

## 2014-02-09 DIAGNOSIS — N891 Moderate vaginal dysplasia: Secondary | ICD-10-CM

## 2014-02-09 NOTE — Patient Instructions (Signed)
Follow up in 3 months

## 2014-02-09 NOTE — Progress Notes (Signed)
Consult Note: Gyn-Onc   Cathy Smith 48 y.o. female  Chief Complaint  Patient presents with  . VAIN    Follow up    Assessment: Recurrent VAIN 3. Immunosuppression with HIV   Plan:  We will attempt to retrieve diffuse VAIN 3 with intravaginal Efudex. She will apply 1 g vaginally for 4 consecutive nights. To repeat this again in one month. I will plan on seeing the patient again a month after that. Side effects were discussed. The patient given instruction sheet and calendar.  Interval History  Patient returns today as requested. Her recent Pap smear showed high-grade dysplasia of the vagina.Marland Kitchen.  HPI:48 year old [place] female seen in consultation requested of [name], and Dr. Marice Potterove regarding management of a newly diagnosed severe dysplasia of the upper vagina. Briefly, the patient's history includes having a past history of abdominal hysterectomy for what we understand and have been fibroids. She denies any problems with Pap smears prior to the hysterectomy. More recently she has had some abnormal Pap smears ultimately resulting in colposcopy and directed biopsy on 04/07/2012. 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  The patient did well until March 2015 with a Pap smear showing high-grade dysplasia once again.   Review of Systems:10 point review of systems is negative as noted above.   Vitals: Blood pressure 146/89, pulse 78, temperature 97.9 F (36.6 C), temperature source Oral, resp. rate 18, height 5' 0.24" (1.53 m), weight 153 lb 3.2 oz (69.491 kg).  Physical Exam: General : The patient is a healthy woman in no acute distress.  HEENT: normocephalic, extraoccular movements normal; neck is supple without thyromegally  Lynphnodes: Supraclavicular and inguinal nodes not enlarged  Abdomen: Soft,  non-tender, no ascites, no organomegally, no masses, no hernias  Pelvic:  EGBUS: Normal female there are areas of white epithelium on the vulva consistent with VIN Vagina: Normal, no lesions  Urethra and Bladder: Normal, non-tender  Cervix: Surgically absent  Uterus: Surgically absent  Bi-manual examination: Non-tender; no adenxal masses or nodularity  Rectal: normal sphincter tone, no masses, no blood  Lower extremities: No edema or varicosities. Normal range of motion   Colposcopy procedure: Vaginal colposcopy is performed using a green filter and acetic acid. Diffuse white lesions are noted throughout the vagina.     No Known Allergies  Past Medical History  Diagnosis Date  . HIV infection   . Hypertension     Past Surgical History  Procedure Laterality Date  . Thyroid surgery    . Abdominal hysterectomy      Current Outpatient Prescriptions  Medication Sig Dispense Refill  . fluorouracil (EFUDEX) 5 % cream Apply topically 2 (two) times daily.      . hydrochlorothiazide (HYDRODIURIL) 25 MG tablet TAKE 2 TABLETS BY MOUTH EVERY DAY  60 tablet  2  . ISENTRESS 400 MG tablet TAKE ONE TABLET BY MOUTH TWICE DAILY  60 tablet  6  . NORVIR 100 MG TABS tablet TAKE 1 TABLET BY MOUTH EVERY DAY WITH BREAKFAST  30 tablet  6  . PREZISTA 800 MG tablet TAKE 1 TABLET BY MOUTH DAILY  30 tablet  6  . TRUVADA 200-300 MG per tablet TAKE 1 TABLET BY MOUTH DAILY  30 tablet  6   No current facility-administered medications for this visit.    History   Social History  . Marital  Status: Married    Spouse Name: N/A    Number of Children: N/A  . Years of Education: N/A   Occupational History  . Not on file.   Social History Main Topics  . Smoking status: Never Smoker   . Smokeless tobacco: Never Used  . Alcohol Use: No  . Drug Use: No  . Sexual Activity: Yes    Partners: Male    Birth Control/ Protection: Condom     Comment: pt. given condoms   Other Topics Concern  . Not on file    Social History Narrative  . No narrative on file    Family History  Problem Relation Age of Onset  . Cataracts Mother   . Hypertension Father       Jeannette Corpus, MD 02/09/2014, 12:23 PM

## 2014-02-22 ENCOUNTER — Telehealth: Payer: Self-pay | Admitting: *Deleted

## 2014-02-22 ENCOUNTER — Telehealth: Payer: Self-pay | Admitting: Gynecologic Oncology

## 2014-02-22 DIAGNOSIS — R300 Dysuria: Secondary | ICD-10-CM

## 2014-02-22 NOTE — Telephone Encounter (Signed)
Patient called c/o burning on urination. Recent colposcopy at Dr. Marden Noblelarke-Peason's office. Advised patient to call their office back and she was reluctant to do this. Called for her and spoke with his nurse and she will call Alyss back with instructions.

## 2014-02-22 NOTE — Telephone Encounter (Signed)
Spoke with the patient on Tues and Wed of this week about vulvar irritation after the use of effudex cream last week.  On Tues am, she had not used the premarin cream in the vagina as advised.  At that time, she was advised to use the premarin cream and the irritation around her vagina was most likely from the use of effudex cream last week which caused irritation to the skin.  Patient called back yesterday and reported continued irritation around the vagina and mild pain with urination.  She had not currently used the premarin cream.  Advised to use the premarin cream and to not rinse the cream out of her vagina like she did with the effudex cream.  Verbalizing understanding.  She was advised at that time to call the office in one to two days if symptoms persisted or worsened after the use of the premarin cream.  Spoke with the patient today after receiving a phone call from [place] at Dr. Moshe CiproHatcher's office stating the patient felt she had a UTI and wanted medication called in for her.  Informed the office of the recent effudex use and that I would contact the patient.  Spoke with the patient.  Initiatally she reported "doing fine" then stated "doing so-so" with mild improvement in the irritation around her vagina.  Reporting stinging in the vaginal area with urination.  She reports using the premarin cream last pm with some relief.  Patient questioned about whether she felt she should come in to the office to give a urine sample.  Due to her symptoms of dysuria, appt made for tomorrow at the lab for the patient to give a urine sample.  At that time, she will be given a peri-bottle to use with urination to rinse urine off of the irritated skin.  She is advised to call for any questions or concerns.

## 2014-02-23 ENCOUNTER — Other Ambulatory Visit: Payer: Self-pay | Admitting: Gynecologic Oncology

## 2014-02-23 ENCOUNTER — Other Ambulatory Visit (HOSPITAL_BASED_OUTPATIENT_CLINIC_OR_DEPARTMENT_OTHER): Payer: Medicare Other

## 2014-02-23 ENCOUNTER — Telehealth: Payer: Self-pay | Admitting: Gynecologic Oncology

## 2014-02-23 DIAGNOSIS — R300 Dysuria: Secondary | ICD-10-CM

## 2014-02-23 LAB — URINALYSIS, MICROSCOPIC - CHCC
BILIRUBIN (URINE): NEGATIVE
GLUCOSE UR CHCC: NEGATIVE mg/dL
Ketones: NEGATIVE mg/dL
NITRITE: NEGATIVE
Protein: <30 mg/dL
Specific Gravity, Urine: 1.025 (ref 1.003–1.035)
Urobilinogen, UR: 0.2 mg/dL (ref 0.2–1)
pH: 5 (ref 4.6–8.0)

## 2014-02-23 NOTE — Telephone Encounter (Signed)
Attempted to call patient about urinalysis results.  Culture pending.  Will re-attempt at a later time.

## 2014-02-24 LAB — URINE CULTURE

## 2014-02-26 ENCOUNTER — Telehealth: Payer: Self-pay | Admitting: *Deleted

## 2014-02-26 NOTE — Telephone Encounter (Signed)
Notified pt she does not have a UTI,most likely irritation from the Effudex cream. Pt verbalized understanding.

## 2014-02-26 NOTE — Telephone Encounter (Signed)
Message copied by GARNER, Gerald LeitzMARY P on Mon Feb 26, 2014  1:36 PM ------      Message from: CROSS, MELISSA D      Created: Mon Feb 26, 2014 12:04 PM       Please see how she is doing and let her know she does not have a UTI.  Most likely irritation from the Effudex cream.  Thanks!            ----- Message -----         From: [facility]         Sent: 02/23/2014  11:50 AM           To: Doylene BodeMelissa D Cross, NP                   ------

## 2014-03-08 ENCOUNTER — Ambulatory Visit: Payer: Medicare Other

## 2014-03-13 ENCOUNTER — Ambulatory Visit: Payer: Medicare Other

## 2014-03-19 ENCOUNTER — Other Ambulatory Visit: Payer: Self-pay | Admitting: Infectious Diseases

## 2014-03-19 DIAGNOSIS — I10 Essential (primary) hypertension: Secondary | ICD-10-CM

## 2014-04-20 ENCOUNTER — Ambulatory Visit: Payer: Medicare Other | Attending: Gynecology | Admitting: Gynecology

## 2014-04-20 ENCOUNTER — Encounter: Payer: Self-pay | Admitting: Gynecology

## 2014-04-20 ENCOUNTER — Other Ambulatory Visit: Payer: Self-pay | Admitting: Infectious Diseases

## 2014-04-20 ENCOUNTER — Other Ambulatory Visit (HOSPITAL_COMMUNITY)
Admission: RE | Admit: 2014-04-20 | Discharge: 2014-04-20 | Disposition: A | Discharge status: Home / Self Care | Payer: Medicare Other | Source: Ambulatory Visit | Attending: Gynecology | Admitting: Gynecology

## 2014-04-20 VITALS — BP 145/98 | HR 73 | Temp 98.1°F | Resp 18 | Ht 60.0 in | Wt 150.9 lb

## 2014-04-20 DIAGNOSIS — Z124 Encounter for screening for malignant neoplasm of cervix: Secondary | ICD-10-CM | POA: Insufficient documentation

## 2014-04-20 DIAGNOSIS — Z79899 Other long term (current) drug therapy: Secondary | ICD-10-CM | POA: Diagnosis not present

## 2014-04-20 DIAGNOSIS — N891 Moderate vaginal dysplasia: Secondary | ICD-10-CM

## 2014-04-20 DIAGNOSIS — Z90710 Acquired absence of both cervix and uterus: Secondary | ICD-10-CM | POA: Insufficient documentation

## 2014-04-20 DIAGNOSIS — Z21 Asymptomatic human immunodeficiency virus [HIV] infection status: Secondary | ICD-10-CM | POA: Insufficient documentation

## 2014-04-20 DIAGNOSIS — B20 Human immunodeficiency virus [HIV] disease: Secondary | ICD-10-CM | POA: Diagnosis not present

## 2014-04-20 DIAGNOSIS — I10 Essential (primary) hypertension: Secondary | ICD-10-CM | POA: Insufficient documentation

## 2014-04-20 DIAGNOSIS — R300 Dysuria: Secondary | ICD-10-CM

## 2014-04-20 DIAGNOSIS — D072 Carcinoma in situ of vagina: Secondary | ICD-10-CM | POA: Diagnosis not present

## 2014-04-20 NOTE — Progress Notes (Signed)
Consult Note: Gyn-Onc   Altamese Dillingenee C Mcmillion 48 y.o. female  Chief Complaint  Patient presents with  . VAIN    Follow up    Assessment: Recurrent VAIN 3. Immunosuppression with HIV   Plan:   Pap smears obtained. If it is normal we will have the patient return in 3 months for repeat Pap smear. If it is abnormal, I would like to retreat her with Efudex for 2 additional months.  Interval History  Patient returns today as requested.  She has just completed 2 more months of intravaginal Efudex. She tolerated the therapy well and had no side effects.  She denies any vaginal or vulvar pruritus bleeding or discharge.  HPI:48 year old [place] female seen in consultation requested of [name], and Dr. Marice Potterove regarding management of a newly diagnosed severe dysplasia of the upper vagina. Briefly, the patient's history includes having a past history of abdominal hysterectomy for what we understand and have been fibroids. She denies any problems with Pap smears prior to the hysterectomy. More recently she has had some abnormal Pap smears ultimately resulting in colposcopy and directed biopsy on 04/07/2012. 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  The patient did well until March 2015 with a Pap smear showing high-grade dysplasia once again. In April and May 2015 the patient received 2 additional months of Efudex.   Review of Systems:10 point review of systems is negative as noted above.   Vitals: Blood pressure 145/98, pulse 73, temperature 98.1 F (36.7 C), temperature source Oral, resp. rate 18, height 5' (1.524 m), weight 150 lb 14.4 oz (68.448 kg).  Physical Exam: General : The patient is a healthy woman in no acute distress.  HEENT: normocephalic, extraoccular movements normal; neck is supple without thyromegally   Lynphnodes: Supraclavicular and inguinal nodes not enlarged  Abdomen: Soft, non-tender, no ascites, no organomegally, no masses, no hernias  Pelvic:  EGBUS: Normal female there are areas of white epithelium on the vulva consistent with VIN Vagina: Normal, no lesions  Urethra and Bladder: Normal, non-tender  Cervix: Surgically absent  Uterus: Surgically absent  Bi-manual examination: Non-tender; no adenxal masses or nodularity  Rectal: normal sphincter tone, no masses, no blood  Lower extremities: No edema or varicosities. Normal range of motion   Colposcopy procedure: Vaginal colposcopy is performed using a green filter and acetic acid. Diffuse white lesions are noted throughout the vagina.     No Known Allergies  Past Medical History  Diagnosis Date  . HIV infection   . Hypertension     Past Surgical History  Procedure Laterality Date  . Thyroid surgery    . Abdominal hysterectomy      Current Outpatient Prescriptions  Medication Sig Dispense Refill  . fluorouracil (EFUDEX) 5 % cream Apply topically 2 (two) times daily.      . hydrochlorothiazide (HYDRODIURIL) 25 MG tablet TAKE 2 TABLETS BY MOUTH EVERY DAY  60 tablet  3  . ISENTRESS 400 MG tablet TAKE ONE TABLET BY MOUTH TWICE DAILY  60 tablet  6  . NORVIR 100 MG TABS tablet TAKE 1 TABLET BY MOUTH EVERY DAY WITH BREAKFAST  30 tablet  6  . PREZISTA 800 MG tablet TAKE 1 TABLET BY MOUTH DAILY  30 tablet  6  . TRUVADA 200-300 MG per tablet TAKE 1 TABLET BY MOUTH DAILY  30 tablet  6  No current facility-administered medications for this visit.    History   Social History  . Marital Status: Married    Spouse Name: N/A    Number of Children: N/A  . Years of Education: N/A   Occupational History  . Not on file.   Social History Main Topics  . Smoking status: Never Smoker   . Smokeless tobacco: Never Used  . Alcohol Use: No  . Drug Use: No  . Sexual Activity: Yes    Partners: Male    Birth Control/ Protection:  Condom     Comment: pt. given condoms   Other Topics Concern  . Not on file   Social History Narrative  . No narrative on file    Family History  Problem Relation Age of Onset  . Cataracts Mother   . Hypertension Father       Jeannette CorpusLARKE-PEARSON,Mory Herrman L, MD 04/20/2014, 10:14 AM

## 2014-04-20 NOTE — Addendum Note (Signed)
Addended by: Cooper RenderGARNER, Daylen Lipsky P on: 04/20/2014 03:20 PM   Modules accepted: Orders

## 2014-04-20 NOTE — Patient Instructions (Signed)
We will call you with your pap smear results. F/iu in 3 months

## 2014-04-25 LAB — CYTOLOGY - PAP

## 2014-05-19 ENCOUNTER — Other Ambulatory Visit: Payer: Self-pay | Admitting: Infectious Diseases

## 2014-05-31 ENCOUNTER — Ambulatory Visit (INDEPENDENT_AMBULATORY_CARE_PROVIDER_SITE_OTHER): Payer: Medicare Other | Admitting: Family Medicine

## 2014-05-31 VITALS — BP 146/87 | HR 86 | Temp 98.5°F | Resp 16 | Ht 61.0 in | Wt 154.0 lb

## 2014-05-31 DIAGNOSIS — B20 Human immunodeficiency virus [HIV] disease: Secondary | ICD-10-CM | POA: Diagnosis not present

## 2014-05-31 DIAGNOSIS — E785 Hyperlipidemia, unspecified: Secondary | ICD-10-CM | POA: Diagnosis not present

## 2014-05-31 DIAGNOSIS — R635 Abnormal weight gain: Secondary | ICD-10-CM | POA: Diagnosis not present

## 2014-05-31 DIAGNOSIS — I10 Essential (primary) hypertension: Secondary | ICD-10-CM

## 2014-05-31 NOTE — Progress Notes (Signed)
Subjective:    Patient ID: Cathy Smith, female    DOB: 08/29/1966, 48 y.o.   MRN: [id number]  HPI Patient in the office Cathy Smith establish care. Cathy Smith is a well appearing patient that has been followed by Dr[facility]inet[facility]Hatcher [facility]. She states that she feels well and is taking medications consistently. She reports that she was last evaluated by Dr. Ninetta LightsHatcher in March, 2015. She denies headache, dizziness, numbness/tingling, shortness of breath, cp, fatigue, nausea, vomiting, or diarrhea.   Patient also has a history of hypertension. She reports that she has been taking medication consistently as prescribed. She says that she is not following a diet regimen. She eats fast food occasionally and does a great deal of cooking at home. Cathy Smith is not exercisint on a regular basis.  Blood pressure is controlled at home. Patient currently denies chest pain, chest pressure/discomfort, fatigue and palpitations.    Cathy Smith has a history of hypertriglyceridemia. She is currently not on medications to control hypertriglyceridemia. Last lab value was 882 four months ago.   Patient has a history of an abnormal pap smear. She was evaluated by Dr. Marice Potterove regarding management of a newly diagnosed severe dysplasia of the upper vagina. Patient also has a history of a hysterectomy for  fibroids. She reports that her pap smears were negative  prior to the hysterectomy. More recently she has had some abnormal pap smears that resulted in a  colposcopy and biopsy on 04/07/2012. 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 She is currently being treated with Effudex. Cathy Smith is scheduled to follow up on 07/26/2014.      Active Ambulatory Problems    Diagnosis Date Noted  . HIV DISEASE 09/15/2000  . MONILIASIS, ORAL 09/24/2008  . 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 HYPERTENSION 06/12/2009  . SINUSITIS, ACUTE NOS 03/18/2007  . APHTHOUS ULCERS 08/18/2006  . HX, PERSONAL, PAST  NONCOMPLIANCE 11/24/2006  . HYSTERECTOMY, HX OF 11/10/1983  . Abnormal cervical Papanicolaou smear 10/22/2011  . Dysuria 07/13/2012  . Other and unspecified hyperlipidemia 01/23/2013   Resolved Ambulatory Problems    Diagnosis Date Noted  . FURUNCLE 08/18/2006  . ABSCESS, GLUTEAL 08/18/2006  . ASCUS PAP 04/18/2010   Past Medical History  Diagnosis Date  . HIV infection   . Hypertension       Review of Systems  Constitutional: Negative.   HENT: Negative.   Eyes: Negative.   Respiratory: Negative.   Cardiovascular: Negative.   Genitourinary: Negative.   Musculoskeletal: Negative.   Skin: Negative.   Allergic/Immunologic: Negative.   Neurological: Negative.   Hematological: Negative.   Psychiatric/Behavioral: Negative.        Objective:   Physical Exam  Constitutional: She is oriented to person, place, and time. She appears well-developed and well-nourished.  HENT:  Head: Normocephalic and atraumatic.  Right Ear: External ear normal.  Left Ear: External ear normal.  Mouth/Throat: Uvula is midline and oropharynx is clear and moist. Abnormal dentition (Multiple teeth missing). Dental caries present.  Eyes: Conjunctivae are normal. Pupils are equal, round, and reactive to light. Lids are everted and swept, no foreign bodies found. No scleral icterus.  Neck: Normal range of motion.  Cardiovascular: Normal rate, regular rhythm, normal heart sounds and intact distal pulses.   Pulmonary/Chest: Effort normal and breath sounds normal.  Abdominal: Soft. Bowel sounds are normal.  Musculoskeletal: Normal range of motion.  Neurological: She is alert and oriented to person, place, and time.  Skin: Skin is warm and dry.  Psychiatric: She has a normal mood and affect. Her speech is normal and behavior is normal. Judgment and thought content normal.      BP 146/87  Pulse 86  Temp(Src) 98.5 F (36.9 C) (Oral)  Resp 16  Ht 5\' 1"  (1.549 m)  Wt 154 lb (69.854 kg)  BMI 29.11  kg/m2    Assessment & Plan:   1. HIV: Reviewed previous notes from Dr. Ninetta Lights. HIV stable on current medication regimen. Patient to return to clinic to see Dr. Ninetta Lights in September  2. Hypertension: Stable on current medication regimen. To continue Hydrodiuril 25 mg daily.   3. Hyperlipidemia: Patient has a history of hyperlipidemia. 4 months ago triglycerides were elevated at 832.  Will check fasting lipid panel. Will consider adding medication for elevated triglycerides.  4. Weight gain: Discussed diet and exercise regimen at length. She states that she recently started walking with a group of ladies for exercise. Discussed starting an 1800 calorie lowfat, low sodium diet divided over 6 small meals and increasing water intake to 6-8 glasses per day.   Gynecologist: Will return on 07/26/2014 Vaccination: Immunizations up to date Colonoscopy: Up to date. Ms. Santini states that she is up to date. However, I Will review records as they come available Labs: Lipid panel (will return in am for fasting labs) RTC: Will return in 3 months for CPE with Dr. Ashley Royalty   Current outpatient prescriptions:fluorouracil (EFUDEX) 5 % cream, Apply topically 2 (two) times daily., Disp: , Rfl: ;  hydrochlorothiazide (HYDRODIURIL) 25 MG tablet, TAKE 2 TABLETS BY MOUTH EVERY DAY, Disp: 60 tablet, Rfl: 3;  ISENTRESS 400 MG tablet, TAKE ONE TABLET BY MOUTH TWICE DAILY, Disp: 60 tablet, Rfl: 5;  NORVIR 100 MG TABS tablet, TAKE 1 TABLET BY MOUTH EVERY DAY WITH BREAKFAST, Disp: 30 tablet, Rfl: 5 PREZISTA 800 MG tablet, TAKE 1 TABLET BY MOUTH DAILY, Disp: 30 tablet, Rfl: 6;  TRUVADA 200-300 MG per tablet, TAKE 1 TABLET BY MOUTH DAILY, Disp: 30 tablet, Rfl: 5

## 2014-06-01 ENCOUNTER — Other Ambulatory Visit: Payer: Medicare Other

## 2014-06-01 DIAGNOSIS — E785 Hyperlipidemia, unspecified: Secondary | ICD-10-CM | POA: Diagnosis not present

## 2014-06-01 DIAGNOSIS — I10 Essential (primary) hypertension: Secondary | ICD-10-CM | POA: Diagnosis not present

## 2014-06-01 LAB — LIPID PANEL
CHOL/HDL RATIO: 5.4 ratio
Cholesterol: 200 mg/dL (ref 0–200)
HDL: 37 mg/dL — AB (ref >39)
LDL Cholesterol: 84 mg/dL (ref 0–99)
Triglycerides: 395 mg/dL — ABNORMAL HIGH (ref <150)
VLDL: 79 mg/dL — ABNORMAL HIGH (ref 0–40)

## 2014-06-01 LAB — BASIC METABOLIC PANEL
BUN: 10 mg/dL (ref 6–23)
CALCIUM: 8.7 mg/dL (ref 8.4–10.5)
CO2: 26 mEq/L (ref 19–32)
Chloride: 103 mEq/L (ref 96–112)
Creat: 0.87 mg/dL (ref 0.50–1.10)
Glucose, Bld: 102 mg/dL — ABNORMAL HIGH (ref 70–99)
Potassium: 3.7 mEq/L (ref 3.5–5.3)
Sodium: 139 mEq/L (ref 135–145)

## 2014-06-07 ENCOUNTER — Encounter: Payer: Self-pay | Admitting: Family Medicine

## 2014-06-25 ENCOUNTER — Telehealth: Payer: Self-pay | Admitting: Family Medicine

## 2014-06-25 DIAGNOSIS — E781 Pure hyperglyceridemia: Secondary | ICD-10-CM

## 2014-06-25 MED ORDER — FENOFIBRATE 48 MG PO TABS: 48.0000 mg | ORAL_TABLET | Freq: Every day | ORAL | Status: DC

## 2014-06-25 NOTE — Telephone Encounter (Signed)
Reviewed lipid panel, triglycerides have decreased from 832 to 395. Started Tricor 48 mg daily. Patient to continue lowfat diet divided over 6 small meals as discussed during appointment.

## 2014-07-19 ENCOUNTER — Other Ambulatory Visit: Payer: Self-pay | Admitting: Infectious Diseases

## 2014-07-20 ENCOUNTER — Other Ambulatory Visit: Payer: Self-pay | Admitting: *Deleted

## 2014-07-20 DIAGNOSIS — I10 Essential (primary) hypertension: Secondary | ICD-10-CM

## 2014-07-20 MED ORDER — HYDROCHLOROTHIAZIDE 25 MG PO TABS: ORAL_TABLET | ORAL | Status: DC

## 2014-07-25 ENCOUNTER — Other Ambulatory Visit: Payer: Medicare PPO

## 2014-07-25 DIAGNOSIS — E785 Hyperlipidemia, unspecified: Secondary | ICD-10-CM

## 2014-07-25 DIAGNOSIS — B20 Human immunodeficiency virus [HIV] disease: Secondary | ICD-10-CM

## 2014-07-25 LAB — COMPREHENSIVE METABOLIC PANEL
ALBUMIN: 4.4 g/dL (ref 3.5–5.2)
ALT: 17 U/L (ref 0–35)
AST: 19 U/L (ref 0–37)
Alkaline Phosphatase: 65 U/L (ref 39–117)
BUN: 12 mg/dL (ref 6–23)
CHLORIDE: 107 meq/L (ref 96–112)
CO2: 19 meq/L (ref 19–32)
Calcium: 9 mg/dL (ref 8.4–10.5)
Creat: 0.93 mg/dL (ref 0.50–1.10)
Glucose, Bld: 96 mg/dL (ref 70–99)
POTASSIUM: 3.5 meq/L (ref 3.5–5.3)
Sodium: 141 mEq/L (ref 135–145)
Total Bilirubin: 0.4 mg/dL (ref 0.2–1.2)
Total Protein: 7.6 g/dL (ref 6.0–8.3)

## 2014-07-25 LAB — CBC
HEMATOCRIT: 35.1 % — AB (ref 36.0–46.0)
Hemoglobin: 12.2 g/dL (ref 12.0–15.0)
MCH: 31.4 pg (ref 26.0–34.0)
MCHC: 34.8 g/dL (ref 30.0–36.0)
MCV: 90.2 fL (ref 78.0–100.0)
Platelets: 180 10*3/uL (ref 150–400)
RBC: 3.89 MIL/uL (ref 3.87–5.11)
RDW: 13.1 % (ref 11.5–15.5)
WBC: 2.9 10*3/uL — AB (ref 4.0–10.5)

## 2014-07-25 LAB — LIPID PANEL
Cholesterol: 150 mg/dL (ref 0–200)
HDL: 41 mg/dL (ref >39)
LDL Cholesterol: 88 mg/dL (ref 0–99)
Total CHOL/HDL Ratio: 3.7 Ratio
Triglycerides: 107 mg/dL (ref <150)
VLDL: 21 mg/dL (ref 0–40)

## 2014-07-26 ENCOUNTER — Other Ambulatory Visit (HOSPITAL_COMMUNITY)
Admission: RE | Admit: 2014-07-26 | Discharge: 2014-07-26 | Disposition: A | Discharge status: Home / Self Care | Payer: Medicare PPO | Source: Ambulatory Visit | Attending: Gynecologic Oncology | Admitting: Gynecologic Oncology

## 2014-07-26 ENCOUNTER — Ambulatory Visit: Payer: Medicare PPO | Attending: Gynecologic Oncology | Admitting: Gynecologic Oncology

## 2014-07-26 ENCOUNTER — Encounter: Payer: Self-pay | Admitting: Gynecologic Oncology

## 2014-07-26 VITALS — BP 128/81 | HR 74 | Temp 97.7°F | Resp 22 | Ht 61.0 in | Wt 149.9 lb

## 2014-07-26 DIAGNOSIS — R87622 Low grade squamous intraepithelial lesion on cytologic smear of vagina (LGSIL): Secondary | ICD-10-CM | POA: Insufficient documentation

## 2014-07-26 DIAGNOSIS — D072 Carcinoma in situ of vagina: Secondary | ICD-10-CM | POA: Diagnosis not present

## 2014-07-26 DIAGNOSIS — Z90710 Acquired absence of both cervix and uterus: Secondary | ICD-10-CM | POA: Insufficient documentation

## 2014-07-26 DIAGNOSIS — R87623 High grade squamous intraepithelial lesion on cytologic smear of vagina (HGSIL): Secondary | ICD-10-CM | POA: Diagnosis not present

## 2014-07-26 DIAGNOSIS — N893 Dysplasia of vagina, unspecified: Secondary | ICD-10-CM

## 2014-07-26 DIAGNOSIS — B20 Human immunodeficiency virus [HIV] disease: Secondary | ICD-10-CM

## 2014-07-26 DIAGNOSIS — R87613 High grade squamous intraepithelial lesion on cytologic smear of cervix (HGSIL): Secondary | ICD-10-CM

## 2014-07-26 DIAGNOSIS — Z124 Encounter for screening for malignant neoplasm of cervix: Secondary | ICD-10-CM | POA: Diagnosis present

## 2014-07-26 LAB — T-HELPER CELL (CD4) - (RCID CLINIC ONLY)
CD4 T CELL ABS: 200 /uL — AB (ref 400–2700)
CD4 T CELL HELPER: 16 % — AB (ref 33–55)

## 2014-07-26 LAB — HIV-1 RNA QUANT-NO REFLEX-BLD: HIV 1 RNA Quant: <20 copies/mL (ref <20)

## 2014-07-26 NOTE — Progress Notes (Signed)
Consult Note: Gyn-Onc   Cathy Smith 48 y.o. female  Chief Complaint  Patient presents with  . 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 If it is normal we will have the patient return in 3 months for repeat Pap smear. If it is abnormal, I would like to retreat her with Efudex for 2 additional months.  Interval History  Patient returns today as requested.  She has not completed the additional 2 more months of intravaginal Efudex as she became confused regarding the instructions. She tolerated the therapy well and had no side effects.  She denies any vaginal or vulvar pruritus bleeding or discharge.  HPI:48 year old [place] American female seen in consultation requested of [name], and [name] regarding management of a newly diagnosed severe dysplasia of the upper vagina. Briefly, the patient's history includes having a past history of abdominal hysterectomy for what we understand and have been fibroids. She denies any problems with Pap smears prior to the hysterectomy. More recently she has had some abnormal Pap smears ultimately resulting in colposcopy and directed biopsy on 04/07/2012. 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  The patient did well until March 2015 with a Pap smear showing high-grade dysplasia once again. In April and May 2015 the patient received 2 additional months of Efudex. Pap in June 2015 showed LGSIL with small areas consistent with HGSIL.   Review of Systems:10 point review of systems is negative as noted above.   Vitals: Blood pressure 128/81, pulse 74, temperature 97.7 F (36.5 C), temperature source Oral, resp. rate 22, height  (1.549 m), weight 149 lb 14.4 oz (67.994 kg).  Physical Exam: General  : The patient is a healthy woman in no acute distress.  HEENT: normocephalic, extraoccular movements normal; neck is supple without thyromegally  Lynphnodes: Supraclavicular and inguinal nodes not enlarged  Abdomen: Soft, non-tender, no ascites, no organomegally, no masses, no hernias  Pelvic:  EGBUS: Normal female there are areas of white epithelium on the vulva consistent with VIN Vagina: Normal, no lesions  Urethra and Bladder: Normal, non-tender  Cervix: Surgically absent  Uterus: Surgically absent  Bi-manual examination: Non-tender; no adenxal masses or nodularity  Rectal: normal sphincter tone, no masses, no blood  Lower extremities: No edema or varicosities. Normal range of motion   Colposcopy procedure: Vaginal colposcopy is performed using a green filter and acetic acid. Diffuse white lesions are noted throughout the vagina.     No Known Allergies  Past Medical History  Diagnosis Date  . HIV infection   . Hypertension     Past Surgical History  Procedure Laterality Date  . Thyroid surgery    . Abdominal hysterectomy      Current Outpatient Prescriptions  Medication Sig Dispense Refill  . fenofibrate (TRICOR) 48 MG tablet Take 1 tablet (48 mg total) by mouth daily.  30 tablet  2  . fluorouracil (EFUDEX) 5 % cream Apply topically 2 (two) times daily.      . hydrochlorothiazide (HYDRODIURIL) 25 MG tablet TAKE 2 TABLETS BY MOUTH EVERY DAY  60 tablet  3  . ISENTRESS 400 MG tablet TAKE ONE TABLET BY MOUTH TWICE DAILY  60 tablet  5  . NORVIR 100 MG TABS tablet TAKE 1 TABLET BY MOUTH EVERY  DAY WITH BREAKFAST  30 tablet  5  . PREZISTA 800 MG tablet TAKE 1 TABLET BY MOUTH DAILY  30 tablet  6  . TRUVADA 200-300 MG per tablet TAKE 1 TABLET BY MOUTH DAILY  30 tablet  5   No current facility-administered medications for this visit.    History   Social History  . Marital Status: Married    Spouse Name: N/A    Number of Children: N/A  . Years of Education: N/A    Occupational History  . Not on file.   Social History Main Topics  . Smoking status: Never Smoker   . Smokeless tobacco: Never Used  . Alcohol Use: No  . Drug Use: No  . Sexual Activity: Yes    Partners: Male    Birth Control/ Protection: Condom     Comment: pt. given condoms   Other Topics Concern  . Not on file   Social History Narrative  . No narrative on file    Family History  Problem Relation Age of Onset  . Cataracts Mother   . Hypertension Father       Quinn Axe, MD 07/26/2014, 12:42 PM

## 2014-07-26 NOTE — Patient Instructions (Signed)
Effudex twice a day for one week followed by three weeks of premarin cream then repeat.  See calenders for details.  Plan to see Dr. Stanford Breed on Dec 7.

## 2014-07-26 NOTE — Addendum Note (Signed)
Addended by: Warner Mccreedy D on: 07/26/2014 02:43 PM   Modules accepted: Orders

## 2014-07-30 LAB — CYTOLOGY - PAP

## 2014-08-06 ENCOUNTER — Telehealth: Payer: Self-pay | Admitting: Gynecologic Oncology

## 2014-08-06 NOTE — Telephone Encounter (Signed)
Calling about skin irritation with urination.  Irritation noted after using effudex cream last week.  Burning experienced externally with urination.  She is to use the premarin cream MWF this week.  Advised to use a spray bottle or peri-bottle to spray warm water to her vulva at the same time as urination to dilute the urine and clean the vulva.  She is to call in one to two days with an update.

## 2014-08-08 ENCOUNTER — Other Ambulatory Visit: Payer: Self-pay | Admitting: *Deleted

## 2014-08-08 ENCOUNTER — Other Ambulatory Visit: Payer: Self-pay | Admitting: Infectious Diseases

## 2014-08-08 ENCOUNTER — Ambulatory Visit (INDEPENDENT_AMBULATORY_CARE_PROVIDER_SITE_OTHER): Payer: Medicare PPO | Admitting: Infectious Diseases

## 2014-08-08 ENCOUNTER — Encounter: Payer: Self-pay | Admitting: Infectious Diseases

## 2014-08-08 VITALS — BP 136/89 | HR 90 | Temp 98.3°F | Ht 61.0 in | Wt 160.0 lb

## 2014-08-08 DIAGNOSIS — Z23 Encounter for immunization: Secondary | ICD-10-CM

## 2014-08-08 DIAGNOSIS — E781 Pure hyperglyceridemia: Secondary | ICD-10-CM

## 2014-08-08 DIAGNOSIS — B20 Human immunodeficiency virus [HIV] disease: Secondary | ICD-10-CM

## 2014-08-08 DIAGNOSIS — Z113 Encounter for screening for infections with a predominantly sexual mode of transmission: Secondary | ICD-10-CM

## 2014-08-08 DIAGNOSIS — Z1231 Encounter for screening mammogram for malignant neoplasm of breast: Secondary | ICD-10-CM

## 2014-08-08 DIAGNOSIS — R87613 High grade squamous intraepithelial lesion on cytologic smear of cervix (HGSIL): Secondary | ICD-10-CM

## 2014-08-08 MED ORDER — RITONAVIR 100 MG PO TABS
ORAL_TABLET | ORAL | Status: DC
Start: 2014-08-08 — End: 2014-11-28

## 2014-08-08 MED ORDER — RALTEGRAVIR POTASSIUM 400 MG PO TABS: ORAL_TABLET | ORAL | Status: DC

## 2014-08-08 MED ORDER — EMTRICITABINE-TENOFOVIR DF 200-300 MG PO TABS: ORAL_TABLET | ORAL | Status: DC

## 2014-08-08 NOTE — Assessment & Plan Note (Signed)
She is doing very well. Will cont her current rx's. She gets next Hep B vax, flu vax. Will see her back in 6months.

## 2014-08-08 NOTE — Assessment & Plan Note (Signed)
i encouraged her to take her rx as written. greatly appreciate GYN-Onc f/u.

## 2014-08-08 NOTE — Assessment & Plan Note (Signed)
Greatly appreciate Dr Ashley RoyaltyMatthews f/u. She is on tricor now.

## 2014-08-08 NOTE — Progress Notes (Signed)
   Subjective:    Patient ID: Cathy Smith, female    DOB: 04/04/1966, 48 y.o.   MRN: [id number]  HPI  48 yo F with hx of HIV+ on ISS/DRVr/TRV. Last Genotype naive. Prev abn Genotypes.  On November 30, 2009 she underwent anterior cervical  decompression, C4-5, C5-6. Arthrodesis C4-C5, 7-mm structural allograft  and C5-C6, 6-mm structural allograft. Anterior instrumentation 30-mm  Synthes Vectra plate with 14 mm screws.  Also with hx of VAIN (high grade). Has had f/u with GYN and had been using effudex/5FU bid. She has been off this due to side effects, burning. Has had GYN f/u this month.  Husband is HIV+, sees Dr Orvan Falconerampbell (on ART). Married since Dec 2014.  HIV 1 RNA Quant (copies/mL)  Date Value  07/25/2014 <20   01/24/2014 <20   07/19/2013 <20      CD4 T Cell Abs (/uL)  Date Value  07/25/2014 200*  01/24/2014 210*  07/19/2013 200*   Lab Results  Component Value Date   CHOL 150 07/25/2014   HDL 41 07/25/2014   LDLCALC 88 07/25/2014   TRIG 107 07/25/2014   CHOLHDL 3.7 07/25/2014     Has been feeling well. Has been taking ART without problems. Gets flu today, gets Hep B vax today as well.  Mammo 11-2013  Review of Systems  Constitutional: Negative for appetite change and unexpected weight change.  Gastrointestinal: Negative for diarrhea and constipation.  Genitourinary: Positive for dysuria and vaginal pain.       Objective:   Physical Exam  Constitutional: She appears well-developed and well-nourished.  HENT:  Mouth/Throat: No oropharyngeal exudate.  Eyes: EOM are normal. Pupils are equal, round, and reactive to light.  Neck: Neck supple.  Cardiovascular: Normal rate, regular rhythm and normal heart sounds.   Pulmonary/Chest: Effort normal and breath sounds normal.  Abdominal: Soft. Bowel sounds are normal. She exhibits no distension. There is no tenderness.  Lymphadenopathy:    She has no cervical adenopathy.      Assessment & Plan:

## 2014-08-08 NOTE — Addendum Note (Signed)
Addended by: Andree CossHOWELL, Theola Cuellar M on: 08/08/2014 12:17 PM   Modules accepted: Orders

## 2014-08-10 ENCOUNTER — Other Ambulatory Visit: Payer: Self-pay | Admitting: Licensed Clinical Social Worker

## 2014-08-10 MED ORDER — DARUNAVIR ETHANOLATE 800 MG PO TABS: ORAL_TABLET | ORAL | Status: DC

## 2014-08-15 ENCOUNTER — Encounter: Payer: Self-pay | Admitting: Family Medicine

## 2014-08-15 ENCOUNTER — Ambulatory Visit (INDEPENDENT_AMBULATORY_CARE_PROVIDER_SITE_OTHER): Payer: Medicare PPO | Admitting: Family Medicine

## 2014-08-15 VITALS — BP 140/87 | HR 83 | Temp 98.0°F | Resp 16 | Ht 61.0 in | Wt 153.0 lb

## 2014-08-15 DIAGNOSIS — B20 Human immunodeficiency virus [HIV] disease: Secondary | ICD-10-CM

## 2014-08-15 DIAGNOSIS — R350 Frequency of micturition: Secondary | ICD-10-CM | POA: Insufficient documentation

## 2014-08-15 DIAGNOSIS — R300 Dysuria: Secondary | ICD-10-CM

## 2014-08-15 DIAGNOSIS — R399 Unspecified symptoms and signs involving the genitourinary system: Secondary | ICD-10-CM | POA: Insufficient documentation

## 2014-08-15 DIAGNOSIS — I10 Essential (primary) hypertension: Secondary | ICD-10-CM

## 2014-08-15 DIAGNOSIS — R3915 Urgency of urination: Secondary | ICD-10-CM

## 2014-08-15 NOTE — Patient Instructions (Signed)

## 2014-08-15 NOTE — Progress Notes (Signed)
   Subjective:    Patient ID: Cathy Smith, female    DOB: 04/18/1966, 48 y.o.   MRN: [id number]  Dysuria  This is a new problem. The current episode started 1 to 4 weeks ago. The problem occurs intermittently. The problem has been unchanged. The quality of the pain is described as aching. The pain is at a severity of 2/10. The pain is mild. There has been no fever. She is sexually active. There is no history of pyelonephritis. Associated symptoms include frequency and urgency. Pertinent negatives include no chills, discharge, hematuria, hesitancy or possible pregnancy. She has tried increased fluids for the symptoms. The treatment provided no relief. Her past medical history is significant for a urological procedure. There is no history of kidney stones or recurrent UTIs.      Review of Systems  Constitutional: Negative for chills.  HENT: Negative.   Eyes: Negative.   Respiratory: Negative.   Cardiovascular: Negative.   Gastrointestinal: Negative.   Endocrine: Negative.   Genitourinary: Positive for dysuria, urgency and frequency. Negative for hesitancy and hematuria.  Musculoskeletal: Negative.   Skin: Negative.   Allergic/Immunologic: Negative.   Neurological: Negative.   Hematological: Negative.   Psychiatric/Behavioral: Negative.        Objective:   Physical Exam  Constitutional: She is oriented to person, place, and time. Vital signs are normal. She appears well-developed and well-nourished. She is active.  Non-toxic appearance. She does not have a sickly appearance.  HENT:  Mouth/Throat: Uvula is midline. Abnormal dentition.  Eyes: Conjunctivae and EOM are normal. Pupils are equal, round, and reactive to light. Left eye exhibits hordeolum.  Neck: Trachea normal and normal range of motion. Neck supple.  Cardiovascular: Normal rate, regular rhythm, normal heart sounds and intact distal pulses.   Pulmonary/Chest: Effort normal and breath sounds normal.  Abdominal: Soft. Bowel  sounds are normal.  Musculoskeletal: Normal range of motion. She exhibits no tenderness.  Neurological: She is alert and oriented to person, place, and time. She has normal reflexes.  Skin: Skin is warm and dry.      BP 140/87  Pulse 83  Temp(Src) 98 F (36.7 C)  Resp 16  Ht 5\' 1"  (1.549 m)  Wt 153 lb (69.4 kg)  BMI 28.92 kg/m2    Assessment & Plan:  1. Dysuria Cathy Smith states that pain with urination started 2 weeks ago. She has increased water intake over the past week without relief. Patient has a history of VAIN and has been treated with effudex. She is followed by Dr. Loree Smith. She states currently denies vaginal discharge, abnormal bleeding, or itching.   - Urinalysis, Complete  2. Urinary tract infection symptoms  - Urine culture  3. Urinary frequency Will notify patient as urinalysis results come available.   4. Urinary urgency Recommend that Cathy Smith empty bladder completely with urination and wipe from front to back.   5. Essential hypertension Stable on current medication regimen.   6. Human immunodeficiency virus (HIV) disease Stable; Last visit with Cathy Smith was on 08/08/2014, reviewed notes.    RTC: as previously scheduled with Cathy Smith

## 2014-08-17 ENCOUNTER — Telehealth: Payer: Self-pay | Admitting: Family Medicine

## 2014-08-17 DIAGNOSIS — R3915 Urgency of urination: Secondary | ICD-10-CM

## 2014-08-17 DIAGNOSIS — R350 Frequency of micturition: Secondary | ICD-10-CM

## 2014-08-17 DIAGNOSIS — R399 Unspecified symptoms and signs involving the genitourinary system: Secondary | ICD-10-CM

## 2014-08-17 LAB — URINALYSIS, COMPLETE
BACTERIA UA: NONE SEEN
Bilirubin Urine: NEGATIVE
Casts: NONE SEEN
Glucose, UA: NEGATIVE mg/dL
Hgb urine dipstick: NEGATIVE
KETONES UR: NEGATIVE mg/dL
NITRITE: NEGATIVE
Protein, ur: NEGATIVE mg/dL
Specific Gravity, Urine: 1.027 (ref 1.005–1.030)
UROBILINOGEN UA: 0.2 mg/dL (ref 0.0–1.0)
pH: 5 (ref 5.0–8.0)

## 2014-08-17 LAB — URINE CULTURE
COLONY COUNT: NO GROWTH
Organism ID, Bacteria: NO GROWTH

## 2014-08-17 MED ORDER — CIPROFLOXACIN HCL 500 MG PO TABS: 500.0000 mg | ORAL_TABLET | Freq: Two times a day (BID) | ORAL | Status: DC

## 2014-08-17 NOTE — Telephone Encounter (Signed)
Meds ordered this encounter  Medications  . ciprofloxacin (CIPRO) 500 MG tablet    Sig: Take 1 tablet (500 mg total) by mouth 2 (two) times daily.    Dispense:  6 tablet    Refill:  0    Order Specific Question:  Supervising Provider    Answer:  Marthann SchillerMATTHEWS, MICHELLE A [[id number]]   Discussed with patient via phone.

## 2014-08-17 NOTE — Telephone Encounter (Signed)
Patient advised of urinary results and to take cipro as prescribed, I sent medication into correct pharmacy. Patient  verbalized understanding. Thanks!

## 2014-08-17 NOTE — Addendum Note (Signed)
Addended by: Marchia BondBATTEN, Emmajo Bennette E on: 08/17/2014 11:42 AM   Modules accepted: Orders

## 2014-08-21 ENCOUNTER — Other Ambulatory Visit: Payer: Self-pay | Admitting: Family Medicine

## 2014-08-29 ENCOUNTER — Telehealth: Payer: Self-pay | Admitting: Gynecologic Oncology

## 2014-08-29 NOTE — Telephone Encounter (Signed)
Patient called asking when her last colonoscopy was.  Informed that unable to find a record of her having a colonoscopy in [facility].  Also asking about dates and times for upcoming appts.  Advised to call for any questions or concerns.

## 2014-09-06 ENCOUNTER — Encounter: Payer: Self-pay | Admitting: Internal Medicine

## 2014-09-06 ENCOUNTER — Ambulatory Visit (INDEPENDENT_AMBULATORY_CARE_PROVIDER_SITE_OTHER): Payer: Commercial Managed Care - HMO | Admitting: Internal Medicine

## 2014-09-06 VITALS — BP 142/95 | HR 88 | Temp 98.4°F | Resp 16 | Ht 61.0 in | Wt 154.0 lb

## 2014-09-06 DIAGNOSIS — L659 Nonscarring hair loss, unspecified: Secondary | ICD-10-CM | POA: Insufficient documentation

## 2014-09-06 DIAGNOSIS — R5383 Other fatigue: Secondary | ICD-10-CM

## 2014-09-06 DIAGNOSIS — Z0000 Encounter for general adult medical examination without abnormal findings: Secondary | ICD-10-CM | POA: Insufficient documentation

## 2014-09-06 DIAGNOSIS — I10 Essential (primary) hypertension: Secondary | ICD-10-CM

## 2014-09-06 DIAGNOSIS — Z23 Encounter for immunization: Secondary | ICD-10-CM | POA: Insufficient documentation

## 2014-09-06 LAB — TSH: TSH: 0.894 u[IU]/mL (ref 0.350–4.500)

## 2014-09-06 MED ORDER — SPIRONOLACTONE 25 MG PO TABS: 25.0000 mg | ORAL_TABLET | Freq: Every day | ORAL | Status: DC

## 2014-09-06 NOTE — Progress Notes (Signed)
Patient ID: Cathy DillingRenee C Smith, female   DOB: 02/04/1966, 48 y.o.   MRN: [id number]   Cathy PantherRenee Smith, is a 48 y.o. female  ION:[id number]  KGM:[id number]  DOB - 01/30/1966  CC:  Chief Complaint  Patient presents with  . Annual Exam       HPI: Cathy Smith is a 48 y.o. female here today for annual physical. 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 She had a PAP smear done on 04/20/14 as part of her management and evaluation. She had a mammogram performed last year which she reports as normal.   She has c/o fatigue which appears to be worsening and also complains of intermittent constipation. She has a h/o goiter and had what could be a a partial thyroidectomy by her description.  She reports that she has never taken any medication for her thyroid since having the surgery.   Pt also has a c/o of alopecia which she noticed after removing a hair adornment that was crocheted into her hair for an extended period of time. She denies itching or ongoing hair breakage.  Patient has No headache, No chest pain, No abdominal pain - No Nausea, No new weakness tingling or numbness, No Cough - SOB.  No Known Allergies Past Medical History  Diagnosis Date  . HIV infection   . Hypertension    Current Outpatient Prescriptions on File Prior to Visit  Medication Sig Dispense Refill  . conjugated estrogens (PREMARIN) vaginal cream Place 1 Applicatorful vaginally 3 (three) times a week.      . Darunavir Ethanolate (PREZISTA) 800 MG tablet TAKE 1 TABLET BY MOUTH DAILY  30 tablet  6  . emtricitabine-tenofovir (TRUVADA) 200-300 MG per tablet TAKE 1 TABLET BY MOUTH DAILY  30 tablet  5  . fenofibrate (TRICOR) 48 MG tablet Take 1 tablet (48 mg total) by mouth daily.  30 tablet  2  . fluorouracil (EFUDEX) 5 % cream Apply topically 2 (two) times daily.      . raltegravir (ISENTRESS) 400 MG tablet TAKE ONE TABLET BY MOUTH TWICE DAILY  60 tablet  5  . ritonavir (NORVIR) 100 MG TABS tablet TAKE 1  TABLET BY MOUTH EVERY DAY WITH BREAKFAST  30 tablet  5  . ciprofloxacin (CIPRO) 500 MG tablet Take 1 tablet (500 mg total) by mouth 2 (two) times daily.  6 tablet  0   No current facility-administered medications on file prior to visit.   Family History  Problem Relation Age of Onset  . Cataracts Mother   . Hypertension Father    History   Social History  . Marital Status: Married    Spouse Name: N/A    Number of Children: N/A  . Years of Education: N/A   Occupational History  . Not on file.   Social History Main Topics  . Smoking status: Never Smoker   . Smokeless tobacco: Never Used  . Alcohol Use: No  . Drug Use: No  . Sexual Activity: Yes    Partners: Male    Birth Control/ Protection: Condom     Comment: pt. given condoms   Other Topics Concern  . Not on file   Social History Narrative  . No narrative on file    Review of Systems: Constitutional: Negative for fever, chills, diaphoresis, activity change, appetite change. HENT: Negative for ear pain, nosebleeds, congestion, facial swelling, rhinorrhea, neck pain, neck stiffness and ear discharge.  Eyes: Negative for pain, discharge, redness, itching and visual disturbance.  Respiratory: Negative for cough, choking, chest tightness, shortness of breath, wheezing and stridor.  Cardiovascular: Negative for chest pain, palpitations and leg swelling. Gastrointestinal: Negative for abdominal distention.  Genitourinary: Negative for dysuria, urgency, frequency, hematuria, flank pain, decreased urine volume, difficulty urinating and dyspareunia.  Musculoskeletal: Negative for back pain, joint swelling, arthralgia and gait problem. Neurological: Negative for dizziness, tremors, seizures, syncope, facial asymmetry, speech difficulty, weakness, light-headedness, numbness and headaches.  Hematological: Negative for adenopathy. Does not bruise/bleed easily. Psychiatric/Behavioral: Negative for hallucinations, behavioral  problems, confusion, dysphoric mood, decreased concentration and agitation.    Objective:    Filed Vitals:   09/06/14 1544  BP: 142/95  Pulse: 88  Temp: 98.4 F (36.9 C)  Resp: 16    Physical Exam: Constitutional: Patient appears well-developed and well-nourished. No distress. HENT: Normocephalic, atraumatic, External right and left ear normal. Oropharynx is clear and moist.  Eyes: Conjunctivae and EOM are normal. PERRLA, no scleral icterus. Neck: Normal ROM. Neck supple. No JVD. No tracheal deviation. No thyromegaly. CVS: RRR, S1/S2 +, no murmurs, no gallops, no carotid bruit.  Pulmonary: Effort and breath sounds normal, no stridor, rhonchi, wheezes, rales.  Abdominal: Soft. BS +, no distension, tenderness, rebound or guarding.  Musculoskeletal: Normal range of motion. No edema and no tenderness.  Lymphadenopathy: No lymphadenopathy noted, cervical, inguinal or axillary Neuro: Alert. Normal reflexes, muscle tone coordination. No cranial nerve deficit. Skin: Skin is warm and dry. No rash noted. Not diaphoretic. No erythema. No pallor. Psychiatric: Normal mood and affect. Behavior, judgment,  Pt appears to have flight of ideas or decreased focus during the visit. She would start a statement and then move onto another thought without completing the original thought.  Lab Results  Component Value Date   WBC 2.9* 07/25/2014   HGB 12.2 07/25/2014   HCT 35.1* 07/25/2014   MCV 90.2 07/25/2014   PLT 180 07/25/2014   Lab Results  Component Value Date   CREATININE 0.93 07/25/2014   BUN 12 07/25/2014   NA 141 07/25/2014   K 3.5 07/25/2014   CL 107 07/25/2014   CO2 19 07/25/2014    No results found for this basename: HGBA1C   Lipid Panel     Component Value Date/Time   CHOL 150 07/25/2014 1030   TRIG 107 07/25/2014 1030   HDL 41 07/25/2014 1030   CHOLHDL 3.7 07/25/2014 1030   VLDL 21 07/25/2014 1030   LDLCALC 88 07/25/2014 1030       Assessment and plan:   1. Alopecia of scalp -  Refer to Dr. Danella DeisGruber or [place]: Dermatology. This appears to be either related to traction or ciratricial alopecia. However she will likely require scalp biopsy.  2. Other fatigue - Pt has had thyroid surgery and is unable to give any details. I will check her thyroid given her fatigue and decreased focus. - TSH  3. Essential hypertension - Pt has elevated BP and is currently not on any medications. However almost all antihypertensives have significant adverse interactions with the HAART medications that she is currently taking. Will start on low dose spironolactone as has favorable interaction profile.  spironolactone (ALDACTONE) 25 MG tablet; Take 1 tablet (25 mg total) by mouth daily.  Dispense: 30 tablet; Refill: 3 - Urinalysis  4. Need for Tdap vaccination - Tdap vaccine greater than or equal to 7yo IM  5. Physical exam, annual - Urinalysis - 12 Lead EKG   Follow-up on file.  The patient was given clear instructions to go to ER  or return to medical center if symptoms don't improve, worsen or new problems develop. The patient verbalized understanding. The patient was told to call to get lab results if they haven't heard anything in the next week.     This note has been created with Education officer, environmental. Any transcriptional errors are unintentional.    Mekayla Soman A., MD [facility] [place], [place] [phone]   09/06/2014, 4:55 PM

## 2014-09-07 ENCOUNTER — Encounter: Payer: Self-pay | Admitting: Internal Medicine

## 2014-09-07 LAB — URINALYSIS
Bilirubin Urine: NEGATIVE
GLUCOSE, UA: NEGATIVE mg/dL
Hgb urine dipstick: NEGATIVE
Ketones, ur: NEGATIVE mg/dL
LEUKOCYTES UA: NEGATIVE
Nitrite: NEGATIVE
PROTEIN: NEGATIVE mg/dL
SPECIFIC GRAVITY, URINE: 1.023 (ref 1.005–1.030)
Urobilinogen, UA: 0.2 mg/dL (ref 0.0–1.0)
pH: 5.5 (ref 5.0–8.0)

## 2014-09-10 ENCOUNTER — Encounter: Payer: Self-pay | Admitting: Internal Medicine

## 2014-09-16 ENCOUNTER — Other Ambulatory Visit: Payer: Self-pay | Admitting: Internal Medicine

## 2014-10-12 ENCOUNTER — Ambulatory Visit (INDEPENDENT_AMBULATORY_CARE_PROVIDER_SITE_OTHER): Payer: Commercial Managed Care - HMO | Admitting: Family Medicine

## 2014-10-12 ENCOUNTER — Encounter: Payer: Self-pay | Admitting: Family Medicine

## 2014-10-12 VITALS — BP 108/70 | HR 88 | Temp 98.1°F | Resp 16

## 2014-10-12 DIAGNOSIS — I10 Essential (primary) hypertension: Secondary | ICD-10-CM

## 2014-10-12 DIAGNOSIS — Z1211 Encounter for screening for malignant neoplasm of colon: Secondary | ICD-10-CM

## 2014-10-12 DIAGNOSIS — B20 Human immunodeficiency virus [HIV] disease: Secondary | ICD-10-CM

## 2014-10-12 NOTE — Progress Notes (Signed)
   Subjective:    Patient ID: Cathy Smith, female    DOB: 11/17/1965, 48 y.o.   MRN: [id number]  HPI  Cathy Smith is a 48 year old female with a history of hypertension, HIV, and hyperlipidemia that presents for a follow up of hypertension. She was started on Spironolactone 1 month ago and is taking medication consistently.   She is not exercising and is adherent to low salt diet.  Blood pressure is well controlled at home. Patient denies chest pain, claudication, fatigue, irregular heart beat, near-syncope, orthopnea, syncope and tachypnea.  Cardiovascular risk factors include: dyslipidemia, hypertension and sedentary lifestyle.  Past Medical History  Diagnosis Date  . HIV infection   . Hypertension     Review of Systems  Constitutional: Negative.  Negative for fever and fatigue.  HENT: Negative.   Eyes: Negative.  Negative for pain, discharge and itching.  Respiratory: Negative.   Cardiovascular: Negative.  Negative for chest pain, palpitations and leg swelling.  Gastrointestinal: Negative.   Endocrine: Negative.   Genitourinary: Negative.   Musculoskeletal: Negative.   Skin: Negative.   Allergic/Immunologic: Negative.   Neurological: Negative.  Negative for dizziness, syncope, weakness and headaches.  Hematological: Negative.   Psychiatric/Behavioral: Negative.        Objective:   Physical Exam  Constitutional: She is oriented to person, place, and time. She appears well-developed and well-nourished.  HENT:  Head: Normocephalic and atraumatic. Hair is abnormal (alopecia).  Right Ear: External ear normal.  Left Ear: External ear normal.  Mouth/Throat: Oropharynx is clear and moist. She has dentures.  Eyes: Conjunctivae are normal. Pupils are equal, round, and reactive to light.  Neck: Normal range of motion. Neck supple.  Cardiovascular: Normal rate, regular rhythm, normal heart sounds and intact distal pulses.   Pulmonary/Chest: Effort normal and breath sounds normal.   Abdominal: Soft. Bowel sounds are normal.  Musculoskeletal: Normal range of motion.  Neurological: She is alert and oriented to person, place, and time. She has normal reflexes.  Skin: Skin is warm and dry.  Psychiatric: She has a normal mood and affect. Her behavior is normal. Judgment and thought content normal.         BP 108/70 mmHg  Pulse 88  Temp(Src) 98.1 F (36.7 C) (Oral)  Resp 16 Assessment & Plan:  1. Essential hypertension Patient states that she needs to make an appointment for yearly eye exam. She is taking medication consistently. She is not checking her blood pressure at home. Recommended that Cathy Smith start exercising 2-3 days per week and start DASH diet.   2.Human immunodeficiency virus (HIV) disease Stable on current medication regimen. Patient states that she is followed every 3 months by [facility].   Preventative: She will need a referral for screening colonoscopy.  All vaccinations are up to date. RTC: 3 months for HTN follow up  Massie MaroonHollis,Sharronda Schweers M, FNP

## 2014-10-15 ENCOUNTER — Encounter: Payer: Self-pay | Admitting: Gynecology

## 2014-10-15 ENCOUNTER — Ambulatory Visit: Payer: Commercial Managed Care - HMO | Attending: Gynecology | Admitting: Gynecology

## 2014-10-15 ENCOUNTER — Other Ambulatory Visit (HOSPITAL_COMMUNITY)
Admission: RE | Admit: 2014-10-15 | Discharge: 2014-10-15 | Disposition: A | Discharge status: Home / Self Care | Payer: Commercial Managed Care - HMO | Source: Ambulatory Visit | Attending: Gynecology | Admitting: Gynecology

## 2014-10-15 VITALS — BP 119/71 | HR 76 | Temp 97.9°F | Resp 18 | Ht 61.0 in | Wt 150.9 lb

## 2014-10-15 DIAGNOSIS — Z01411 Encounter for gynecological examination (general) (routine) with abnormal findings: Secondary | ICD-10-CM | POA: Diagnosis present

## 2014-10-15 DIAGNOSIS — Z79899 Other long term (current) drug therapy: Secondary | ICD-10-CM | POA: Insufficient documentation

## 2014-10-15 DIAGNOSIS — N891 Moderate vaginal dysplasia: Secondary | ICD-10-CM

## 2014-10-15 DIAGNOSIS — I10 Essential (primary) hypertension: Secondary | ICD-10-CM | POA: Insufficient documentation

## 2014-10-15 DIAGNOSIS — Z21 Asymptomatic human immunodeficiency virus [HIV] infection status: Secondary | ICD-10-CM | POA: Insufficient documentation

## 2014-10-15 DIAGNOSIS — N893 Dysplasia of vagina, unspecified: Secondary | ICD-10-CM

## 2014-10-15 DIAGNOSIS — B20 Human immunodeficiency virus [HIV] disease: Secondary | ICD-10-CM

## 2014-10-15 DIAGNOSIS — D072 Carcinoma in situ of vagina: Secondary | ICD-10-CM | POA: Insufficient documentation

## 2014-10-15 NOTE — Progress Notes (Signed)
Consult Note: Gyn-Onc   Altamese Dillingenee C Legacy 48 y.o. female  Chief Complaint  Patient presents with  . VAIN II (vaginal intraepithelial neoplasia grade II)   Assessment: Recurrent VAIN 3. Immunosuppression with HIV the last 2 Pap smears showed only low-grade SIL.  Plan:   Pap smears obtained. If it is normal or shows only low-grade SIL we will have the patient return in 3 months for repeat Pap smear. If the Pap smear shows high-grade changes then I would like to retreat her with Efudex for 2 additional months.  Interval History Patient returns today as previously scheduled. Her last Pap smear in September showed only low-grade SIL. She has not had any further Efudex in the last 3 months. She did have urinary tract infection which cleared with antibiotics. Otherwise she denies any pelvic symptoms.  She denies any vaginal or vulvar pruritus bleeding or discharge.  HPI:48 year old [place] female seen in consultation requested of [name], and Dr. Marice Potterove regarding management of a newly diagnosed severe dysplasia of the upper vagina. Briefly, the patient's history includes having a past history of abdominal hysterectomy for what we understand and have been fibroids. She denies any problems with Pap smears prior to the hysterectomy. More recently she has had some abnormal Pap smears ultimately resulting in colposcopy and directed biopsy on 04/07/2012. 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  The patient did well until March 2015 with a Pap smear showing high-grade dysplasia once again. In April and May 2015 the patient received 2 additional months of Efudex. From June 2015 through September her Pap smears only showed low-grade SIL.   Review of Systems:10 point review of systems is negative as noted above.   Vitals:  Blood pressure 119/71, pulse 76, temperature 97.9 F (36.6 C), temperature source Oral, resp. rate 18, height 5\' 1"  (1.549 m), weight 150 lb 14.4 oz (68.448 kg).  Physical Exam: General : The patient is a healthy woman in no acute distress.  HEENT: normocephalic, extraoccular movements normal; neck is supple without thyromegally  Lynphnodes: Supraclavicular and inguinal nodes not enlarged  Abdomen: Soft, non-tender, no ascites, no organomegally, no masses, no hernias  Pelvic:  EGBUS: Normal female there are areas of white epithelium on the vulva consistent with VIN Vagina: Normal, no lesions  Urethra and Bladder: Normal, non-tender  Cervix: Surgically absent  Uterus: Surgically absent  Bi-manual examination: Non-tender; no adenxal masses or nodularity  Rectal: normal sphincter tone, no masses, no blood  Lower extremities: No edema or varicosities. Normal range of motion       No Known Allergies  Past Medical History  Diagnosis Date  . HIV infection   . Hypertension     Past Surgical History  Procedure Laterality Date  . Thyroid surgery    . Abdominal hysterectomy      Current Outpatient Prescriptions  Medication Sig Dispense Refill  . conjugated estrogens (PREMARIN) vaginal cream Place 1 Applicatorful vaginally 3 (three) times a week.    . Darunavir Ethanolate (PREZISTA) 800 MG tablet TAKE 1 TABLET BY MOUTH DAILY 30 tablet 6  . emtricitabine-tenofovir (TRUVADA) 200-300 MG per tablet TAKE 1 TABLET BY MOUTH DAILY 30 tablet 5  . fenofibrate (TRICOR) 48 MG tablet TAKE 1 TABLET BY MOUTH EVERY DAY 30 tablet 2  . raltegravir (ISENTRESS) 400 MG tablet TAKE ONE TABLET BY MOUTH TWICE DAILY 60  tablet 5  . ritonavir (NORVIR) 100 MG TABS tablet TAKE 1 TABLET BY MOUTH EVERY DAY WITH BREAKFAST 30 tablet 5  . spironolactone (ALDACTONE) 25 MG tablet Take 1 tablet (25 mg total) by mouth daily. 30 tablet 3  . fluorouracil (EFUDEX) 5 % cream Apply topically 2 (two) times daily.     No  current facility-administered medications for this visit.    History   Social History  . Marital Status: Married    Spouse Name: N/A    Number of Children: N/A  . Years of Education: N/A   Occupational History  . Not on file.   Social History Main Topics  . Smoking status: Never Smoker   . Smokeless tobacco: Never Used  . Alcohol Use: No  . Drug Use: No  . Sexual Activity:    Partners: Male    Birth Control/ Protection: Condom     Comment: pt. given condoms   Other Topics Concern  . Not on file   Social History Narrative    Family History  Problem Relation Age of Onset  . Cataracts Mother   . Hypertension Father       Jeannette CorpusLARKE-PEARSON,Gearl Baratta L, MD 10/15/2014, 11:58 AM

## 2014-10-15 NOTE — Addendum Note (Signed)
Addended by: Liliane BadeFLINN, Lakashia Collison L on: 10/15/2014 12:27 PM   Modules accepted: Orders

## 2014-10-15 NOTE — Patient Instructions (Signed)
We will call you with pap smear results and let you know the plan once pap results are back.

## 2014-10-18 ENCOUNTER — Encounter: Payer: Self-pay | Admitting: Gastroenterology

## 2014-10-18 LAB — CYTOLOGY - PAP

## 2014-10-19 ENCOUNTER — Telehealth: Payer: Self-pay | Admitting: Gynecologic Oncology

## 2014-10-19 NOTE — Telephone Encounter (Signed)
Spoke with patient about pap smear results.  Advised to follow up in three months with a pap smear at that time.  Appt arranged.  She is to continue using premarin three times weekly per Dr. Stanford Breedlarke-Pearson.  Advised to call for any questions or concerns.  Calender to be mailed to the patient's house with her schedule for premarin use.

## 2014-11-05 ENCOUNTER — Other Ambulatory Visit: Payer: Self-pay

## 2014-11-05 ENCOUNTER — Other Ambulatory Visit: Payer: Self-pay | Admitting: Licensed Clinical Social Worker

## 2014-11-05 ENCOUNTER — Telehealth: Payer: Self-pay | Admitting: *Deleted

## 2014-11-05 DIAGNOSIS — I10 Essential (primary) hypertension: Secondary | ICD-10-CM

## 2014-11-05 MED ORDER — SPIRONOLACTONE 25 MG PO TABS: 25.0000 mg | ORAL_TABLET | Freq: Every day | ORAL | Status: DC

## 2014-11-05 NOTE — Telephone Encounter (Signed)
[facility] asking about HCTZ rx.  RX was labeled as "Pt has not taken in the last 30 days" at her initial visit to Dr. Marthann SchillerMichelle Matthews office, New PCP.  Pt has not been taking the medication.  MD please advise about refilling the rx.

## 2014-11-12 NOTE — Telephone Encounter (Signed)
[facility] notified.

## 2014-11-12 NOTE — Telephone Encounter (Signed)
Please stop rx!

## 2014-11-23 ENCOUNTER — Ambulatory Visit: Payer: Commercial Managed Care - HMO | Admitting: Family Medicine

## 2014-11-28 ENCOUNTER — Other Ambulatory Visit: Payer: Self-pay | Admitting: *Deleted

## 2014-11-28 DIAGNOSIS — B20 Human immunodeficiency virus [HIV] disease: Secondary | ICD-10-CM

## 2014-11-28 MED ORDER — RALTEGRAVIR POTASSIUM 400 MG PO TABS: ORAL_TABLET | ORAL | Status: DC

## 2014-11-28 MED ORDER — EMTRICITABINE-TENOFOVIR DF 200-300 MG PO TABS: ORAL_TABLET | ORAL | Status: DC

## 2014-11-28 MED ORDER — RITONAVIR 100 MG PO TABS: ORAL_TABLET | ORAL | Status: DC

## 2014-11-28 MED ORDER — DARUNAVIR ETHANOLATE 800 MG PO TABS: ORAL_TABLET | ORAL | Status: DC

## 2014-12-05 ENCOUNTER — Ambulatory Visit (HOSPITAL_COMMUNITY)
Admission: RE | Admit: 2014-12-05 | Discharge: 2014-12-05 | Disposition: A | Discharge status: Home / Self Care | Payer: Commercial Managed Care - HMO | Source: Ambulatory Visit | Attending: Infectious Diseases | Admitting: Infectious Diseases

## 2014-12-05 ENCOUNTER — Ambulatory Visit (HOSPITAL_COMMUNITY): Payer: Medicare PPO

## 2014-12-05 ENCOUNTER — Telehealth: Payer: Self-pay | Admitting: *Deleted

## 2014-12-05 DIAGNOSIS — Z1231 Encounter for screening mammogram for malignant neoplasm of breast: Secondary | ICD-10-CM | POA: Insufficient documentation

## 2014-12-05 NOTE — Telephone Encounter (Signed)
Cathy Smith has been approved by the [employer] for the $7500.00 grant.  She is eligible through 12-04-15.

## 2014-12-20 ENCOUNTER — Ambulatory Visit (AMBULATORY_SURGERY_CENTER): Payer: Self-pay | Admitting: *Deleted

## 2014-12-20 VITALS — Ht 60.5 in | Wt 155.6 lb

## 2014-12-20 DIAGNOSIS — Z1211 Encounter for screening for malignant neoplasm of colon: Secondary | ICD-10-CM

## 2014-12-20 MED ORDER — NA SULFATE-K SULFATE-MG SULF 17.5-3.13-1.6 GM/177ML PO SOLN: ORAL | Status: DC

## 2014-12-20 NOTE — Progress Notes (Signed)
No allergies to eggs or soy. No problems with anesthesia.  Pt given Emmi instructions for colonoscopy  No oxygen use  No diet drug use  

## 2014-12-25 ENCOUNTER — Telehealth: Payer: Self-pay | Admitting: Gastroenterology

## 2014-12-25 NOTE — Telephone Encounter (Signed)
Spoke with pt and rescheduled her colonoscopy for  01/28/15 at 3:30pm with Dr Arlyce DiceKaplan and her pre-visit for 01/14/15 at 2:00pm.Pt states she will have enough money next month to pay for the prep at that time.Tried to explain to pt we could give her a cheaper prep such as Miralax and new instructions so she did not have to cancel her appt.The pt wanted to wait. She will call back if she has further questions.Her colonoscopy on 12/30/14 was cancelled

## 2014-12-25 NOTE — Telephone Encounter (Signed)
Spoke with pt regarding changing her prep to Miralax or offering her a Suprep coupon. Pt wanted to change her appt to next month so she could afford the new prep.

## 2014-12-27 ENCOUNTER — Encounter: Payer: Self-pay | Admitting: Gastroenterology

## 2014-12-28 ENCOUNTER — Encounter: Payer: Self-pay | Admitting: Gynecologic Oncology

## 2014-12-28 ENCOUNTER — Other Ambulatory Visit (HOSPITAL_COMMUNITY)
Admission: RE | Admit: 2014-12-28 | Discharge: 2014-12-28 | Disposition: A | Discharge status: Home / Self Care | Payer: Commercial Managed Care - HMO | Source: Ambulatory Visit | Attending: Gynecologic Oncology | Admitting: Gynecologic Oncology

## 2014-12-28 ENCOUNTER — Ambulatory Visit: Payer: Commercial Managed Care - HMO | Attending: Gynecologic Oncology | Admitting: Gynecologic Oncology

## 2014-12-28 VITALS — BP 108/76 | HR 86 | Temp 97.5°F | Resp 18 | Wt 152.1 lb

## 2014-12-28 DIAGNOSIS — D072 Carcinoma in situ of vagina: Secondary | ICD-10-CM | POA: Diagnosis not present

## 2014-12-28 DIAGNOSIS — Z1151 Encounter for screening for human papillomavirus (HPV): Secondary | ICD-10-CM | POA: Insufficient documentation

## 2014-12-28 DIAGNOSIS — R87810 Cervical high risk human papillomavirus (HPV) DNA test positive: Secondary | ICD-10-CM | POA: Diagnosis not present

## 2014-12-28 DIAGNOSIS — N893 Dysplasia of vagina, unspecified: Secondary | ICD-10-CM | POA: Diagnosis not present

## 2014-12-28 DIAGNOSIS — Z01411 Encounter for gynecological examination (general) (routine) with abnormal findings: Secondary | ICD-10-CM | POA: Insufficient documentation

## 2014-12-28 NOTE — Patient Instructions (Signed)
Follow up in 3 months

## 2014-12-28 NOTE — Progress Notes (Signed)
Consult Note: Gyn-Onc   [name] 49 y.o. female  Chief Complaint  Patient presents with  . VAIN(vaginal intraepithelial neoplasia)   Assessment: Recurrent VAIN 3. Immunosuppression with HIV the last 3 Pap smears showed only low-grade SIL.  Plan:   Pap smears obtained. If it is normal or shows only low-grade SIL we will have the patient return in 3 months for repeat Pap smear. If the Pap smear shows high-grade changes then I would like to retreat her with Efudex for 2 additional months.  Interval History Patient returns today as previously scheduled. Her last Pap smear in December 2015 showed only low-grade SIL. She has not had any further Efudex in the last 6 months. She did have urinary tract infection which cleared with antibiotics. Otherwise she denies any pelvic symptoms.  She denies any vaginal or vulvar pruritus bleeding or discharge.  HPI:49 year old [place] female seen in consultation requested of [name], and [name] regarding management of a newly diagnosed severe dysplasia of the upper vagina. Briefly, the patient's history includes having a past history of abdominal hysterectomy for what we understand and have been fibroids. She denies any problems with Pap smears prior to the hysterectomy. More recently she has had some abnormal Pap smears ultimately resulting in colposcopy and directed biopsy on 04/07/2012. 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  The patient did well until March 2015 with a Pap smear showing high-grade dysplasia once again. In April and May 2015 the patient received 2 additional months of Efudex. From June 2015 through December 2015 her Pap smears only showed low-grade SIL.   Review of Systems:10 point review of systems is negative as noted above.   Vitals: Blood  pressure 108/76, pulse 86, temperature 97.5 F (36.4 C), temperature source Oral, resp. rate 18, weight 152 lb 1.6 oz (68.992 kg), SpO2 99 %.  Physical Exam: General : The patient is a healthy woman in no acute distress.  HEENT: normocephalic, extraoccular movements normal; neck is supple without thyromegally  Lynphnodes: Supraclavicular and inguinal nodes not enlarged  Abdomen: Soft, non-tender, no ascites, no organomegally, no masses, no hernias  Pelvic:  EGBUS: Normal female there are areas of white epithelium on the vulva consistent with VIN Vagina: Normal, no lesions  Urethra and Bladder: Normal, non-tender  Cervix: Surgically absent  Uterus: Surgically absent  Bi-manual examination: Non-tender; no adenxal masses or nodularity  Rectal: normal sphincter tone, no masses, no blood  Lower extremities: No edema or varicosities. Normal range of motion       No Known Allergies  Past Medical History  Diagnosis Date  . HIV infection   . Hypertension   . Hyperlipidemia     Past Surgical History  Procedure Laterality Date  . Thyroid surgery    . Abdominal hysterectomy      Current Outpatient Prescriptions  Medication Sig Dispense Refill  . conjugated estrogens (PREMARIN) vaginal cream Place 1 Applicatorful vaginally 3 (three) times a week.    . Darunavir Ethanolate (PREZISTA) 800 MG tablet TAKE 1 TABLET BY MOUTH DAILY 30 tablet 6  . emtricitabine-tenofovir (TRUVADA) 200-300 MG per tablet TAKE 1 TABLET BY MOUTH DAILY 30 tablet 5  . fenofibrate (TRICOR) 48 MG tablet TAKE 1 TABLET BY MOUTH EVERY DAY 30 tablet 2  . raltegravir (ISENTRESS) 400 MG tablet TAKE ONE TABLET BY MOUTH TWICE DAILY 60  tablet 5  . ritonavir (NORVIR) 100 MG TABS tablet TAKE 1 TABLET BY MOUTH EVERY DAY WITH BREAKFAST 30 tablet 5  . spironolactone (ALDACTONE) 25 MG tablet Take 1 tablet (25 mg total) by mouth daily. 30 tablet 3  . fluorouracil (EFUDEX) 5 % cream Apply topically 2 (two) times daily.    . Na  Sulfate-K Sulfate-Mg Sulf (SUPREP BOWEL PREP) SOLN suprep as directed.  No substitutions (Patient not taking: Reported on 12/28/2014) 354 mL 0   No current facility-administered medications for this visit.    History   Social History  . Marital Status: Married    Spouse Name: N/A  . Number of Children: N/A  . Years of Education: N/A   Occupational History  . Not on file.   Social History Main Topics  . Smoking status: Never Smoker   . Smokeless tobacco: Never Used  . Alcohol Use: No  . Drug Use: No  . Sexual Activity:    Partners: Male    Birth Control/ Protection: Condom     Comment: pt. given condoms   Other Topics Concern  . Not on file   Social History Narrative    Family History  Problem Relation Age of Onset  . Cataracts Mother   . Hypertension Father   . Colon cancer Neg Hx       [name], Baley Lorimer Caroline, MD 12/28/2014, 11:37 AM

## 2014-12-31 ENCOUNTER — Encounter: Payer: Commercial Managed Care - HMO | Admitting: Gastroenterology

## 2015-01-02 LAB — CYTOLOGY - PAP

## 2015-01-03 ENCOUNTER — Ambulatory Visit (INDEPENDENT_AMBULATORY_CARE_PROVIDER_SITE_OTHER): Payer: Commercial Managed Care - HMO | Admitting: Internal Medicine

## 2015-01-03 VITALS — BP 110/73 | HR 81 | Temp 98.1°F | Resp 16 | Ht 61.0 in | Wt 157.0 lb

## 2015-01-03 DIAGNOSIS — Z0000 Encounter for general adult medical examination without abnormal findings: Secondary | ICD-10-CM | POA: Diagnosis not present

## 2015-01-03 DIAGNOSIS — H5210 Myopia, unspecified eye: Secondary | ICD-10-CM | POA: Diagnosis not present

## 2015-01-03 DIAGNOSIS — H524 Presbyopia: Secondary | ICD-10-CM | POA: Diagnosis not present

## 2015-01-03 NOTE — Patient Instructions (Signed)
American Heart Association (AHA) Exercise Recommendation  Being physically active is important to prevent heart disease and stroke, the nation's No. 1and No. 5killers. To improve overall cardiovascular health, we suggest at least 150 minutes per week of moderate exercise or 75 minutes per week of vigorous exercise (or a combination of moderate and vigorous activity). Thirty minutes a day, five times a week is an easy goal to remember. You will also experience benefits even if you divide your time into two or three segments of 10 to 15 minutes per day.  For people who would benefit from lowering their blood pressure or cholesterol, we recommend 40 minutes of aerobic exercise of moderate to vigorous intensity three to four times a week to lower the risk for heart attack and stroke.  Physical activity is anything that makes you move your body and burn calories.  This includes things like climbing stairs or playing sports. Aerobic exercises benefit your heart, and include walking, jogging, swimming or biking. Strength and stretching exercises are best for overall stamina and flexibility.  The simplest, positive change you can make to effectively improve your heart health is to start walking. It's enjoyable, free, easy, social and great exercise. A walking program is flexible and boasts high success rates because people can stick with it. It's easy for walking to become a regular and satisfying part of life.   For Overall Cardiovascular Health:  At least 30 minutes of moderate-intensity aerobic activity at least 5 days per week for a total of 150  OR   At least 25 minutes of vigorous aerobic activity at least 3 days per week for a total of 75 minutes; or a combination of moderate- and vigorous-intensity aerobic activity  AND   Moderate- to high-intensity muscle-strengthening activity at least 2 days per week for additional health benefits.  For Lowering Blood Pressure and Cholesterol  An  average 40 minutes of moderate- to vigorous-intensity aerobic activity 3 or 4 times per week  What if I can't make it to the time goal? Something is always better than nothing! And everyone has to start somewhere. Even if you've been sedentary for years, today is the day you can begin to make healthy changes in your life. If you don't think you'll make it for 30 or 40 minutes, set a reachable goal for today. You can work up toward your overall goal by increasing your time as you get stronger. Don't let all-or-nothing thinking rob you of doing what you can every day.  Source:http://www.heart.org   Hypertension Hypertension, commonly called high blood pressure, is when the force of blood pumping through your arteries is too strong. Your arteries are the blood vessels that carry blood from your heart throughout your body. A blood pressure reading consists of a higher number over a lower number, such as 110/72. The higher number (systolic) is the pressure inside your arteries when your heart pumps. The lower number (diastolic) is the pressure inside your arteries when your heart relaxes. Ideally you want your blood pressure below 120/80. Hypertension forces your heart to work harder to pump blood. Your arteries may become narrow or stiff. Having hypertension puts you at risk for heart disease, stroke, and other problems.  RISK FACTORS Some risk factors for high blood pressure are controllable. Others are not.  Risk factors you cannot control include:  2. Race. You may be at higher risk if you are African American. 3. Age. Risk increases with age. 4. Gender. Men are at higher risk than  women before age 84 years. After age 52, women are at higher risk than men. Risk factors you can control include: 2. Not getting enough exercise or physical activity. 3. Being overweight. 4. Getting too much fat, sugar, calories, or salt in your diet. 5. Drinking too much alcohol. SIGNS AND SYMPTOMS Hypertension does  not usually cause signs or symptoms. Extremely high blood pressure (hypertensive crisis) may cause headache, anxiety, shortness of breath, and nosebleed. DIAGNOSIS  To check if you have hypertension, your health care provider will measure your blood pressure while you are seated, with your arm held at the level of your heart. It should be measured at least twice using the same arm. Certain conditions can cause a difference in blood pressure between your right and left arms. A blood pressure reading that is higher than normal on one occasion does not mean that you need treatment. If one blood pressure reading is high, ask your health care provider about having it checked again. TREATMENT  Treating high blood pressure includes making lifestyle changes and possibly taking medicine. Living a healthy lifestyle can help lower high blood pressure. You may need to change some of your habits. Lifestyle changes may include: 2. Following the DASH diet. This diet is high in fruits, vegetables, and whole grains. It is low in salt, red meat, and added sugars. 3. Getting at least 2 hours of brisk physical activity every week. 4. Losing weight if necessary. 5. Not smoking. 6. Limiting alcoholic beverages. 7. Learning ways to reduce stress. If lifestyle changes are not enough to get your blood pressure under control, your health care provider may prescribe medicine. You may need to take more than one. Work closely with your health care provider to understand the risks and benefits. HOME CARE INSTRUCTIONS 2. Have your blood pressure rechecked as directed by your health care provider.  3. Take medicines only as directed by your health care provider. Follow the directions carefully. Blood pressure medicines must be taken as prescribed. The medicine does not work as well when you skip doses. Skipping doses also puts you at risk for problems.  4. Do not smoke.  5. Monitor your blood pressure at home as directed by  your health care provider. SEEK MEDICAL CARE IF:   You think you are having a reaction to medicines taken.  You have recurrent headaches or feel dizzy.  You have swelling in your ankles.  You have trouble with your vision. SEEK IMMEDIATE MEDICAL CARE IF:  You develop a severe headache or confusion.  You have unusual weakness, numbness, or feel faint.  You have severe chest or abdominal pain.  You vomit repeatedly.  You have trouble breathing. MAKE SURE YOU:   Understand these instructions.  Will watch your condition.  Will get help right away if you are not doing well or get worse. Document Released: 10/26/2005 Document Revised: 03/12/2014 Document Reviewed: 08/18/2013 [facility] Patient Information 2015 [place], [place]. This information is not intended to replace advice given to you by your health care provider. Make sure you discuss any questions you have with your health care provider. Pneumococcal Vaccine, Polyvalent solution for injection What is this medicine? PNEUMOCOCCAL VACCINE, POLYVALENT (NEU mo KOK al vak SEEN, pol ee VEY luhnt) is a vaccine to prevent pneumococcus bacteria infection. These bacteria are a major cause of ear infections, Strep throat infections, and serious pneumonia, meningitis, or blood infections worldwide. These vaccines help the body to produce antibodies (protective substances) that help your body defend against these  bacteria. This vaccine is recommended for people 612 years of age and older with health problems. It is also recommended for all adults over 51 years old. This vaccine will not treat an infection. This medicine may be used for other purposes; ask your health care provider or pharmacist if you have questions. COMMON BRAND NAME(S): Pneumovax 23 What should I tell my health care provider before I take this medicine? They need to know if you have any of these conditions: -bleeding problems -bone marrow or organ transplant -cancer,  Hodgkin's disease -fever -infection -immune system problems -low platelet count in the blood -seizures -an unusual or allergic reaction to pneumococcal vaccine, diphtheria toxoid, other vaccines, latex, other medicines, foods, dyes, or preservatives -pregnant or trying to get pregnant -breast-feeding How should I use this medicine? This vaccine is for injection into a muscle or under the skin. It is given by a health care professional. A copy of Vaccine Information Statements will be given before each vaccination. Read this sheet carefully each time. The sheet may change frequently. Talk to your pediatrician regarding the use of this medicine in children. While this drug may be prescribed for children as young as 732 years of age for selected conditions, precautions do apply. Overdosage: If you think you have taken too much of this medicine contact a poison control center or emergency room at once. NOTE: This medicine is only for you. Do not share this medicine with others. What if I miss a dose? It is important not to miss your dose. Call your doctor or health care professional if you are unable to keep an appointment. What may interact with this medicine? -medicines for cancer chemotherapy -medicines that suppress your immune function -medicines that treat or prevent blood clots like warfarin, enoxaparin, and dalteparin -steroid medicines like prednisone or cortisone This list may not describe all possible interactions. Give your health care provider a list of all the medicines, herbs, non-prescription drugs, or dietary supplements you use. Also tell them if you smoke, drink alcohol, or use illegal drugs. Some items may interact with your medicine. What should I watch for while using this medicine? Mild fever and pain should go away in 3 days or less. Report any unusual symptoms to your doctor or health care professional. What side effects may I notice from receiving this medicine? Side  effects that you should report to your doctor or health care professional as soon as possible: -allergic reactions like skin rash, itching or hives, swelling of the face, lips, or tongue -breathing problems -confused -fever over 102 degrees F -pain, tingling, numbness in the hands or feet -seizures -unusual bleeding or bruising -unusual muscle weakness Side effects that usually do not require medical attention (report to your doctor or health care professional if they continue or are bothersome): -aches and pains -diarrhea -fever of 102 degrees F or less -headache -irritable -loss of appetite -pain, tender at site where injected -trouble sleeping This list may not describe all possible side effects. Call your doctor for medical advice about side effects. You may report side effects to FDA at 1-800-FDA-1088. Where should I keep my medicine? This does not apply. This vaccine is given in a clinic, pharmacy, doctor's office, or other health care setting and will not be stored at home. NOTE: This sheet is a summary. It may not cover all possible information. If you have questions about this medicine, talk to your doctor, pharmacist, or health care provider.  2015, Elsevier/Gold Standard. (2008-06-01 14:32:37)

## 2015-01-03 NOTE — Progress Notes (Signed)
Patient ID: Cathy Smith, female   DOB: 19-Oct-1966, 49 y.o.   MRN: [id number]  [facility] VISIT AND CPE  Assessment:  Patient denies any difficulties at home. No trouble with ADLs, depression or falls. No recent changes to vision or hearing. Is UTD with immunizations. Is UTD with screening. Discussed Advanced Directives, patient agrees to bring [place] copies of documents if can. Encouraged heart healthy diet, exercise as tolerated and adequate sleep.    Plan:   During the course of the visit the patient was educated and counseled about appropriate screening and preventive services including:    Pneumococcal vaccine   Influenza vaccine  Td vaccine  Screening electrocardiogram  Bone densitometry screening  Colorectal cancer screening  Diabetes screening  Glaucoma screening  Nutrition counseling   Advanced directives: requested  Screening recommendations, referrals: Vaccinations: Please see documentation below and orders this visit.  Nutrition assessed and recommended  Colonoscopy not indicated Recommended yearly ophthalmology/optometry visit for glaucoma screening and checkup Recommended yearly dental visit for hygiene and checkup Advanced directives - requested  Conditions/risks identified: BMI: Discussed weight loss, diet, and increase physical activity.  Increase physical activity: AHA recommends 150 minutes of physical activity a week.  Medications reviewed Urinary Incontinence is not an issue: discussed non pharmacology and pharmacology options.  Fall risk: low- discussed PT, home fall assessment, medications.    Subjective:  Cathy Smith is a 49 y.o. female who presents for Medicare Annual Wellness Visit and complete physical.  Date of last medicare wellness visit is unknown.  She has had elevated blood pressure for seversl years. Her blood pressure has been controlled at home, today their BP is   She does workout. She denies chest pain, shortness of  breath, dizziness.  She is on cholesterol medication and denies myalgias. Her cholesterol is at goal. The cholesterol last visit was:   Lab Results  Component Value Date   CHOL 150 07/25/2014   HDL 41 07/25/2014   LDLCALC 88 07/25/2014   TRIG 107 07/25/2014   CHOLHDL 3.7 07/25/2014   Patient is not on Vitamin D supplement.   No results found for: VD25OH     Names of Other Physician/Practitioners you currently use: 1. [facility] here for primary care 2. [facility], eye doctor, last visit 01/03/2015 3. [employer] , Education officer, community, Scheduled visit for dental hygiene 4. Adolph[facility]chwood, Ob-Gyn 5. Ginnie Smart- Infectious Disease   Patient Care Team: Altha Harm, MD as PCP - General (Internal Medicine) Ginnie Smart, MD as PCP - Infectious Diseases (Infectious Diseases)   Medication Review: Current Outpatient Prescriptions on File Prior to Visit  Medication Sig Dispense Refill  . conjugated estrogens (PREMARIN) vaginal cream Place 1 Applicatorful vaginally 3 (three) times a week.    . Darunavir Ethanolate (PREZISTA) 800 MG tablet TAKE 1 TABLET BY MOUTH DAILY 30 tablet 6  . emtricitabine-tenofovir (TRUVADA) 200-300 MG per tablet TAKE 1 TABLET BY MOUTH DAILY 30 tablet 5  . fenofibrate (TRICOR) 48 MG tablet TAKE 1 TABLET BY MOUTH EVERY DAY 30 tablet 2  . fluorouracil (EFUDEX) 5 % cream Apply topically 2 (two) times daily.    . Na Sulfate-K Sulfate-Mg Sulf (SUPREP BOWEL PREP) SOLN suprep as directed.  No substitutions (Patient not taking: Reported on 12/28/2014) 354 mL 0  . raltegravir (ISENTRESS) 400 MG tablet TAKE ONE TABLET BY MOUTH TWICE DAILY 60 tablet 5  . ritonavir (NORVIR) 100 MG TABS tablet TAKE 1 TABLET BY MOUTH EVERY DAY  WITH BREAKFAST 30 tablet 5  . spironolactone (ALDACTONE) 25 MG tablet Take 1 tablet (25 mg total) by mouth daily. 30 tablet 3   No current facility-administered medications on file prior to visit.    Current Problems  (verified) Patient Active Problem List   Diagnosis Date Noted  . Physical exam, annual 09/06/2014  . Need for Tdap vaccination 09/06/2014  . Other fatigue 09/06/2014  . Alopecia of scalp 09/06/2014  . Urinary tract infection symptoms 08/15/2014  . Urinary frequency 08/15/2014  . Urinary urgency 08/15/2014  . Hypertriglyceridemia without hypercholesterolemia 08/08/2014  . Other and unspecified hyperlipidemia 01/23/2013  . Dysuria 07/13/2012  . Abnormal cervical Papanicolaou smear 10/22/2011  . 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 Essential hypertension 06/12/2009  . MONILIASIS, ORAL 09/24/2008  . SINUSITIS, ACUTE NOS 03/18/2007  . HX, PERSONAL, PAST NONCOMPLIANCE 11/24/2006  . APHTHOUS ULCERS 08/18/2006  . Human immunodeficiency virus (HIV) disease 09/15/2000  . HYSTERECTOMY, HX OF 11/10/1983    Screening Tests Health Maintenance  Topic Date Due  . COLONOSCOPY  11/17/2013  . INFLUENZA VACCINE  06/10/2015  . MAMMOGRAM  12/05/2016  . PAP SMEAR  10/15/2017  . TETANUS/TDAP  09/06/2024  . HIV Screening  Completed    Immunization History  Administered Date(s) Administered  . H1N1 10/18/2008  . Hepatitis B 01/13/2001, 02/02/2001, 03/02/2001, 11/16/2001  . Hepatitis B, adult/adol-2 dose 02/05/2014, 08/08/2014  . Influenza Split 09/02/2011, 12/20/2012  . Influenza Whole 08/30/2006, 08/03/2007, 09/24/2008, 08/07/2009, 09/11/2010  . Influenza,inj,Quad PF,36+ Mos 08/02/2013, 08/08/2014  . Pneumococcal Polysaccharide-23 08/30/2006, 12/31/2010  . Tdap 09/06/2014    Preventative care: Last colonoscopy: Scheduled for Next Month at [facility] Last mammogram: 12/2014 Last pap smear/pelvic exam: 07/26/2014   DEXA:N/A  Prior vaccinations: TD or Tdap: See above  Influenza: See avobe  Pneumococcal: See above Prevnar13: Need to schedule Shingles/Zostavax: N/A    Medication List       This list is accurate as of: 01/03/15 10:28 AM.  Always use your most recent med list.                conjugated estrogens vaginal cream  Commonly known as:  PREMARIN  Place 1 Applicatorful vaginally 3 (three) times a week.     Darunavir Ethanolate 800 MG tablet  Commonly known as:  PREZISTA  TAKE 1 TABLET BY MOUTH DAILY     emtricitabine-tenofovir 200-300 MG per tablet  Commonly known as:  TRUVADA  TAKE 1 TABLET BY MOUTH DAILY     fenofibrate 48 MG tablet  Commonly known as:  TRICOR  TAKE 1 TABLET BY MOUTH EVERY DAY     fluorouracil 5 % cream  Commonly known as:  EFUDEX  Apply topically 2 (two) times daily.     Na Sulfate-K Sulfate-Mg Sulf Soln  Commonly known as:  SUPREP BOWEL PREP  suprep as directed.  No substitutions     raltegravir 400 MG tablet  Commonly known as:  ISENTRESS  TAKE ONE TABLET BY MOUTH TWICE DAILY     ritonavir 100 MG Tabs tablet  Commonly known as:  NORVIR  TAKE 1 TABLET BY MOUTH EVERY DAY WITH BREAKFAST     spironolactone 25 MG tablet  Commonly known as:  ALDACTONE  Take 1 tablet (25 mg total) by mouth daily.        Past Surgical History  Procedure Laterality Date  . Thyroid surgery    . Abdominal hysterectomy     Family History  Problem Relation Age of Onset  . Cataracts  Mother   . Hypertension Father   . Colon cancer Neg Hx     History reviewed: allergies, current medications, past family history, past medical history, past social history, past surgical history and problem list   Risk Factors: Osteoporosis/FallRisk: No risk factors In the past year have you fallen or had a near fall?:No History of fracture in the past year: no  Tobacco History  Substance Use Topics  . Smoking status: Never Smoker   . Smokeless tobacco: Never Used  . Alcohol Use: No   She does not smoke.  Patient is not a former smoker. Are there smokers in your home (other than you)?  No  Alcohol Current alcohol use: none  Caffeine Current caffeine use: denies use  Exercise Current exercise: walking  Nutrition/Diet Current diet: in  general, a "healthy" diet    Cardiac risk factors: hypertension.  Depression Screen (Note: if answer to either of the following is "Yes", a more complete depression screening is indicated)   Q1: Over the past two weeks, have you felt down, depressed or hopeless? No  Q2: Over the past two weeks, have you felt little interest or pleasure in doing things? No  Have you lost interest or pleasure in daily life? No  Do you often feel hopeless? No  Do you cry easily over simple problems? No  Activities of Daily Living In your present state of health, do you have any difficulty performing the following activities?:  Driving? N/A. Pt does not drive. Managing money?  No Feeding yourself? No Getting from bed to chair? No Climbing a flight of stairs? No Preparing food and eating?: No Bathing or showering? No Getting dressed: No Getting to the toilet? No Using the toilet:No Moving around from place to place: No In the past year have you fallen or had a near fall?:No   Are you sexually active?  Yes  Do you have more than one partner?  Yes  Vision Difficulties: No  Hearing Difficulties: No Do you often ask people to speak up or repeat themselves? No Do you experience ringing or noises in your ears? No Do you have difficulty understanding soft or whispered voices? No  Cognition  Do you feel that you have a problem with memory?No  Do you often misplace items? No  Do you feel safe at home?  Yes  Advanced directives Does patient have a Health Care Power of Attorney? Yes. Husband Cathy Smith. Does patient have a Living Will? No   Objective:      BMI: 29.66 Kg/m  General appearance: alert, no distress, WD/WN, female Cognitive Testing  Alert? Yes  Normal Appearance?Yes  Oriented to person? Yes  Place? Yes   Time? Yes  Recall of three objects?  Yes  Can perform simple calculations? Yes  Displays appropriate judgment?Yes  Can read the correct time from a watch  face?Yes   Medicare Attestation I have personally reviewed: The patient's medical and social history Their use of alcohol, tobacco or illicit drugs Their current medications and supplements The patient's functional ability including ADLs,fall risks, home safety risks, cognitive, and hearing and visual impairment Diet and physical activities Evidence for depression or mood disorders  The patient's weight, height, BMI, and visual acuity have been recorded in the chart.  I have made referrals, counseling, and provided education to the patient based on review of the above and I have provided the patient with a written personalized care plan for preventive services.     MATTHEWS,MICHELLE A., MD  01/03/2015     

## 2015-01-04 ENCOUNTER — Telehealth: Payer: Self-pay | Admitting: Gynecologic Oncology

## 2015-01-04 ENCOUNTER — Encounter: Payer: Self-pay | Admitting: Internal Medicine

## 2015-01-04 NOTE — Telephone Encounter (Signed)
Informed of pap results and advised to follow up in three months per Dr. Andrey Farmerossi.  Advised to call for any questions or concerns.

## 2015-01-04 NOTE — Telephone Encounter (Signed)
Attempted to call patient with pap smear results.  Left message for her to call the office.

## 2015-01-09 ENCOUNTER — Encounter: Payer: Self-pay | Admitting: Family Medicine

## 2015-01-09 ENCOUNTER — Ambulatory Visit (INDEPENDENT_AMBULATORY_CARE_PROVIDER_SITE_OTHER): Payer: Commercial Managed Care - HMO | Admitting: Family Medicine

## 2015-01-09 VITALS — BP 119/83 | HR 90 | Temp 97.7°F | Resp 16 | Ht 61.0 in | Wt 157.0 lb

## 2015-01-09 DIAGNOSIS — I10 Essential (primary) hypertension: Secondary | ICD-10-CM

## 2015-01-09 LAB — COMPLETE METABOLIC PANEL WITH GFR
ALK PHOS: 64 U/L (ref 39–117)
ALT: 18 U/L (ref 0–35)
AST: 18 U/L (ref 0–37)
Albumin: 4.4 g/dL (ref 3.5–5.2)
BUN: 10 mg/dL (ref 6–23)
CO2: 25 mEq/L (ref 19–32)
CREATININE: 0.81 mg/dL (ref 0.50–1.10)
Calcium: 9.7 mg/dL (ref 8.4–10.5)
Chloride: 105 mEq/L (ref 96–112)
GFR, EST NON AFRICAN AMERICAN: 84 mL/min
Glucose, Bld: 104 mg/dL — ABNORMAL HIGH (ref 70–99)
POTASSIUM: 3.8 meq/L (ref 3.5–5.3)
SODIUM: 139 meq/L (ref 135–145)
TOTAL PROTEIN: 7.9 g/dL (ref 6.0–8.3)
Total Bilirubin: 0.3 mg/dL (ref 0.2–1.2)

## 2015-01-09 MED ORDER — SPIRONOLACTONE 25 MG PO TABS: 25.0000 mg | ORAL_TABLET | Freq: Every day | ORAL | Status: DC

## 2015-01-09 NOTE — Patient Instructions (Addendum)
Hypertension Hypertension, commonly called high blood pressure, is when the force of blood pumping through your arteries is too strong. Your arteries are the blood vessels that carry blood from your heart throughout your body. A blood pressure reading consists of a higher number over a lower number, such as 110/72. The higher number (systolic) is the pressure inside your arteries when your heart pumps. The lower number (diastolic) is the pressure inside your arteries when your heart relaxes. Ideally you want your blood pressure below 120/80. Hypertension forces your heart to work harder to pump blood. Your arteries may become narrow or stiff. Having hypertension puts you at risk for heart disease, stroke, and other problems.  RISK FACTORS Some risk factors for high blood pressure are controllable. Others are not.  Risk factors you cannot control include:   Race. You may be at higher risk if you are African American.  Age. Risk increases with age.  Gender. Men are at higher risk than women before age 45 years. After age 65, women are at higher risk than men. Risk factors you can control include:  Not getting enough exercise or physical activity.  Being overweight.  Getting too much fat, sugar, calories, or salt in your diet.  Drinking too much alcohol. SIGNS AND SYMPTOMS Hypertension does not usually cause signs or symptoms. Extremely high blood pressure (hypertensive crisis) may cause headache, anxiety, shortness of breath, and nosebleed. DIAGNOSIS  To check if you have hypertension, your health care provider will measure your blood pressure while you are seated, with your arm held at the level of your heart. It should be measured at least twice using the same arm. Certain conditions can cause a difference in blood pressure between your right and left arms. A blood pressure reading that is higher than normal on one occasion does not mean that you need treatment. If one blood pressure reading  is high, ask your health care provider about having it checked again. TREATMENT  Treating high blood pressure includes making lifestyle changes and possibly taking medicine. Living a healthy lifestyle can help lower high blood pressure. You may need to change some of your habits. Lifestyle changes may include:  Following the DASH diet. This diet is high in fruits, vegetables, and whole grains. It is low in salt, red meat, and added sugars.  Getting at least 2 hours of brisk physical activity every week.  Losing weight if necessary.  Not smoking.  Limiting alcoholic beverages.  Learning ways to reduce stress. If lifestyle changes are not enough to get your blood pressure under control, your health care provider may prescribe medicine. You may need to take more than one. Work closely with your health care provider to understand the risks and benefits. HOME CARE INSTRUCTIONS  Have your blood pressure rechecked as directed by your health care provider.   Take medicines only as directed by your health care provider. Follow the directions carefully. Blood pressure medicines must be taken as prescribed. The medicine does not work as well when you skip doses. Skipping doses also puts you at risk for problems.   Do not smoke.   Monitor your blood pressure at home as directed by your health care provider. SEEK MEDICAL CARE IF:   You think you are having a reaction to medicines taken.  You have recurrent headaches or feel dizzy.  You have swelling in your ankles.  You have trouble with your vision. SEEK IMMEDIATE MEDICAL CARE IF:  You develop a severe headache or confusion.    You have unusual weakness, numbness, or feel faint.  You have severe chest or abdominal pain.  You vomit repeatedly.  You have trouble breathing. MAKE SURE YOU:   Understand these instructions.  Will watch your condition.  Will get help right away if you are not doing well or get worse. Document  Released: 10/26/2005 Document Revised: 03/12/2014 Document Reviewed: 08/18/2013 [employer] Patient Information 2015 [employer]. This information is not intended to replace advice given to you by your health care provider. Make sure you discuss any questions you have with your health care provider. Hypertension Hypertension, commonly called high blood pressure, is when the force of blood pumping through your arteries is too strong. Your arteries are the blood vessels that carry blood from your heart throughout your body. A blood pressure reading consists of a higher number over a lower number, such as 110/72. The higher number (systolic) is the pressure inside your arteries when your heart pumps. The lower number (diastolic) is the pressure inside your arteries when your heart relaxes. Ideally you want your blood pressure below 120/80. Hypertension forces your heart to work harder to pump blood. Your arteries may become narrow or stiff. Having hypertension puts you at risk for heart disease, stroke, and other problems.  RISK FACTORS Some risk factors for high blood pressure are controllable. Others are not.  Risk factors you cannot control include:   Race. You may be at higher risk if you are African American.  Age. Risk increases with age.  Gender. Men are at higher risk than women before age 45 years. After age 65, women are at higher risk than men. Risk factors you can control include:  Not getting enough exercise or physical activity.  Being overweight.  Getting too much fat, sugar, calories, or salt in your diet.  Drinking too much alcohol. SIGNS AND SYMPTOMS Hypertension does not usually cause signs or symptoms. Extremely high blood pressure (hypertensive crisis) may cause headache, anxiety, shortness of breath, and nosebleed. DIAGNOSIS  To check if you have hypertension, your health care provider will measure your blood pressure while you are seated, with your arm held at the level  of your heart. It should be measured at least twice using the same arm. Certain conditions can cause a difference in blood pressure between your right and left arms. A blood pressure reading that is higher than normal on one occasion does not mean that you need treatment. If one blood pressure reading is high, ask your health care provider about having it checked again. TREATMENT  Treating high blood pressure includes making lifestyle changes and possibly taking medicine. Living a healthy lifestyle can help lower high blood pressure. You may need to change some of your habits. Lifestyle changes may include:  Following the DASH diet. This diet is high in fruits, vegetables, and whole grains. It is low in salt, red meat, and added sugars.  Getting at least 2 hours of brisk physical activity every week.  Losing weight if necessary.  Not smoking.  Limiting alcoholic beverages.  Learning ways to reduce stress. If lifestyle changes are not enough to get your blood pressure under control, your health care provider may prescribe medicine. You may need to take more than one. Work closely with your health care provider to understand the risks and benefits. HOME CARE INSTRUCTIONS  Have your blood pressure rechecked as directed by your health care provider.   Take medicines only as directed by your health care provider. Follow the directions carefully. Blood   pressure medicines must be taken as prescribed. The medicine does not work as well when you skip doses. Skipping doses also puts you at risk for problems.   Do not smoke.   Monitor your blood pressure at home as directed by your health care provider. SEEK MEDICAL CARE IF:   You think you are having a reaction to medicines taken.  You have recurrent headaches or feel dizzy.  You have swelling in your ankles.  You have trouble with your vision. SEEK IMMEDIATE MEDICAL CARE IF:  You develop a severe headache or confusion.  You have  unusual weakness, numbness, or feel faint.  You have severe chest or abdominal pain.  You vomit repeatedly.  You have trouble breathing. MAKE SURE YOU:   Understand these instructions.  Will watch your condition.  Will get help right away if you are not doing well or get worse. Document Released: 10/26/2005 Document Revised: 03/12/2014 Document Reviewed: 08/18/2013 [facility] Patient Information 2015 [place], [place]. This information is not intended to replace advice given to you by your health care provider. Make sure you discuss any questions you have with your health care provider. Hypertension Hypertension, commonly called high blood pressure, is when the force of blood pumping through your arteries is too strong. Your arteries are the blood vessels that carry blood from your heart throughout your body. A blood pressure reading consists of a higher number over a lower number, such as 110/72. The higher number (systolic) is the pressure inside your arteries when your heart pumps. The lower number (diastolic) is the pressure inside your arteries when your heart relaxes. Ideally you want your blood pressure below 120/80. Hypertension forces your heart to work harder to pump blood. Your arteries may become narrow or stiff. Having hypertension puts you at risk for heart disease, stroke, and other problems.  RISK FACTORS Some risk factors for high blood pressure are controllable. Others are not.  Risk factors you cannot control include:   Race. You may be at higher risk if you are African American.  Age. Risk increases with age.  Gender. Men are at higher risk than women before age 51 years. After age 51, women are at higher risk than men. Risk factors you can control include:  Not getting enough exercise or physical activity.  Being overweight.  Getting too much fat, sugar, calories, or salt in your diet.  Drinking too much alcohol. SIGNS AND SYMPTOMS Hypertension does not usually  cause signs or symptoms. Extremely high blood pressure (hypertensive crisis) may cause headache, anxiety, shortness of breath, and nosebleed. DIAGNOSIS  To check if you have hypertension, your health care provider will measure your blood pressure while you are seated, with your arm held at the level of your heart. It should be measured at least twice using the same arm. Certain conditions can cause a difference in blood pressure between your right and left arms. A blood pressure reading that is higher than normal on one occasion does not mean that you need treatment. If one blood pressure reading is high, ask your health care provider about having it checked again. TREATMENT  Treating high blood pressure includes making lifestyle changes and possibly taking medicine. Living a healthy lifestyle can help lower high blood pressure. You may need to change some of your habits. Lifestyle changes may include:  Following the DASH diet. This diet is high in fruits, vegetables, and whole grains. It is low in salt, red meat, and added sugars.  Getting at least 2  hours of brisk physical activity every week.  Losing weight if necessary.  Not smoking.  Limiting alcoholic beverages.  Learning ways to reduce stress. If lifestyle changes are not enough to get your blood pressure under control, your health care provider may prescribe medicine. You may need to take more than one. Work closely with your health care provider to understand the risks and benefits. HOME CARE INSTRUCTIONS  Have your blood pressure rechecked as directed by your health care provider.   Take medicines only as directed by your health care provider. Follow the directions carefully. Blood pressure medicines must be taken as prescribed. The medicine does not work as well when you skip doses. Skipping doses also puts you at risk for problems.   Do not smoke.   Monitor your blood pressure at home as directed by your health care  provider. SEEK MEDICAL CARE IF:   You think you are having a reaction to medicines taken.  You have recurrent headaches or feel dizzy.  You have swelling in your ankles.  You have trouble with your vision. SEEK IMMEDIATE MEDICAL CARE IF:  You develop a severe headache or confusion.  You have unusual weakness, numbness, or feel faint.  You have severe chest or abdominal pain.  You vomit repeatedly.  You have trouble breathing. MAKE SURE YOU:   Understand these instructions.  Will watch your condition.  Will get help right away if you are not doing well or get worse. Document Released: 10/26/2005 Document Revised: 03/12/2014 Document Reviewed: 08/18/2013 [facility] Patient Information 2015 [place], [place]. This information is not intended to replace advice given to you by your health care provider. Make sure you discuss any questions you have with your health care provider.

## 2015-01-09 NOTE — Progress Notes (Signed)
Subjective:    Patient ID: Cathy Smith, female    DOB: 02/18/1966, 49 y.o.   MRN: [id number]  HPI  Cathy Smith is a 49 year old female with a history of hypertension, HIV, and hyperlipidemia that presents for a follow up of hypertension. She was started on Spironolactone 1 month ago and is taking medication consistently.   She is exercising and is adherent to low salt diet.  Blood pressure is well controlled at home. Patient denies chest pain, claudication, fatigue, irregular heart beat, near-syncope, orthopnea, syncope and tachypnea.  Cardiovascular risk factors include: dyslipidemia, hypertension and sedentary lifestyle.  Past Medical History  Diagnosis Date  . HIV infection   . Hypertension   . Hyperlipidemia    History   Social History  . Marital Status: Married    Spouse Name: N/A  . Number of Children: N/A  . Years of Education: N/A   Social History Main Topics  . Smoking status: Never Smoker   . Smokeless tobacco: Never Used  . Alcohol Use: No  . Drug Use: No  . Sexual Activity:    Partners: Male    Birth Control/ Protection: Condom     Comment: pt. given condoms   Other Topics Concern  . None   Social History Narrative    Review of Systems  Constitutional: Negative.  Negative for fever and fatigue.  HENT: Negative.   Eyes: Negative.  Negative for pain, discharge and itching.  Respiratory: Negative.   Cardiovascular: Negative.  Negative for leg swelling.  Gastrointestinal: Negative.   Endocrine: Negative.   Genitourinary: Negative.   Musculoskeletal: Negative.   Skin: Negative.   Allergic/Immunologic: Negative.   Neurological: Negative.  Negative for dizziness, syncope and weakness.  Hematological: Negative.   Psychiatric/Behavioral: Negative.        Objective:   Physical Exam  Constitutional: She is oriented to person, place, and time. She appears well-developed and well-nourished.  HENT:  Head: Normocephalic and atraumatic. 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  Right Ear: External ear normal.  Left Ear: External ear normal.  Mouth/Throat: Oropharynx is clear and moist. She has dentures.  Eyes: Conjunctivae are normal. Pupils are equal, round, and reactive to light.  Neck: Normal range of motion. Neck supple.  Cardiovascular: Normal rate, regular rhythm, normal heart sounds and intact distal pulses.   Pulmonary/Chest: Effort normal and breath sounds normal.  Abdominal: Soft. Bowel sounds are normal.  Musculoskeletal: Normal range of motion.  Neurological: She is alert and oriented to person, place, and time. She has normal reflexes.  Skin: Skin is warm and dry.  Psychiatric: She has a normal mood and affect. Her behavior is normal. Judgment and thought content normal.         BP 119/83 mmHg  Pulse 90  Temp(Src) 97.7 F (36.5 C) (Oral)  Resp 16  Ht 5\' 1"  (1.549 m)  Wt 157 lb (71.215 kg)  BMI 29.68 kg/m2 Assessment & Plan:  1. Essential hypertension Blood pressure controlled on current medication regimen.  Patient states that she needs to make an appointment for yearly eye exam. She is taking medication consistently. She is not checking her blood pressure at home.  Cathy Smith started exercising 2-3 days per week. Recommend continuing DASH diet.    2.Human immunodeficiency virus (HIV) disease Controlled on current medication regimen. Patient states that she is followed every 3 months by [facility].    All vaccinations are up to date. RTC: 6 months for HTN follow up  Shannelle Alguire M,  FNP 

## 2015-01-10 ENCOUNTER — Encounter: Payer: Self-pay | Admitting: Family Medicine

## 2015-01-14 ENCOUNTER — Ambulatory Visit (AMBULATORY_SURGERY_CENTER): Payer: Self-pay | Admitting: *Deleted

## 2015-01-14 VITALS — Ht 61.0 in | Wt 155.2 lb

## 2015-01-14 DIAGNOSIS — Z1211 Encounter for screening for malignant neoplasm of colon: Secondary | ICD-10-CM

## 2015-01-14 NOTE — Progress Notes (Signed)
No egg or soy allergy No home 02 or c pap use No diet pills No issues with past sedation

## 2015-01-23 ENCOUNTER — Other Ambulatory Visit: Payer: Medicare PPO

## 2015-01-28 ENCOUNTER — Encounter: Payer: Self-pay | Admitting: Gastroenterology

## 2015-01-28 ENCOUNTER — Ambulatory Visit (AMBULATORY_SURGERY_CENTER): Payer: Commercial Managed Care - HMO | Admitting: Gastroenterology

## 2015-01-28 VITALS — BP 124/94 | HR 99 | Temp 97.9°F | Resp 16 | Ht 61.0 in | Wt 155.0 lb

## 2015-01-28 DIAGNOSIS — I10 Essential (primary) hypertension: Secondary | ICD-10-CM | POA: Diagnosis not present

## 2015-01-28 DIAGNOSIS — Z1211 Encounter for screening for malignant neoplasm of colon: Secondary | ICD-10-CM | POA: Diagnosis not present

## 2015-01-28 DIAGNOSIS — E669 Obesity, unspecified: Secondary | ICD-10-CM | POA: Diagnosis not present

## 2015-01-28 HISTORY — PX: COLONOSCOPY: SHX174

## 2015-01-28 MED ORDER — SODIUM CHLORIDE 0.9 % IV SOLN: 500.0000 mL | INTRAVENOUS | Status: DC

## 2015-01-28 NOTE — Op Note (Signed)
[facility] [address] [place], [id number]   COLONOSCOPY PROCEDURE REPORT  PATIENT: Cathy Smith, Cathy Smith  MR#: [id number] BIRTHDATE: 1966/10/13 , 49  yrs. old GENDER: female ENDOSCOPIST: Louis Meckelobert D Quintana Canelo, MD REFERRED BY: PROCEDURE DATE:  01/28/2015 PROCEDURE:   Colonoscopy, screening First Screening Colonoscopy - Avg.  risk and is 50 yrs.  old or older Yes.  Prior Negative Screening - Now for repeat screening. N/A  History of Adenoma - Now for follow-up colonoscopy & has been > or = to 3 yrs.  N/A ASA CLASS: INDICATIONS:Colorectal Neoplasm Risk Assessment for this procedure is average risk. MEDICATIONS: Monitored anesthesia care, Propofol 200 mg IV, and llidocaine 150 mg IV  DESCRIPTION OF PROCEDURE:   After the risks benefits and alternatives of the procedure were thoroughly explained, informed consent was obtained.  The digital rectal exam revealed no abnormalities of the rectum.   The LB [id number]  endoscope was introduced through the anus and advanced to the ileum. No adverse events experienced.   The quality of the prep was (Suprep was used) good.  The instrument was then slowly withdrawn as the colon was fully examined.      COLON FINDINGS: A normal appearing cecum, ileocecal valve, and appendiceal orifice were identified.  The ascending, transverse, descending, sigmoid colon, and rectum appeared unremarkable. Retroflexed views revealed no abnormalities. The time to cecum = 2.6 Withdrawal time = 9.7   The scope was withdrawn and the procedure completed. COMPLICATIONS: There were no immediate complications.  ENDOSCOPIC IMPRESSION: Normal colonoscopy  RECOMMENDATIONS: Continue current colorectal screening recommendations for "routine risk" patients with a repeat colonoscopy in 10 years.  eSigned:  Louis Meckelobert D Terril Amaro, MD 01/28/2015 4:43 PM   cc: Marlana SalvageMichelle Mathews, MD

## 2015-01-28 NOTE — Progress Notes (Signed)
Report to PACU, RN, vss, BBS= Clear.  

## 2015-01-28 NOTE — Patient Instructions (Signed)
Normal colon exam today. Repeat colonoscopy in 10 years. Call us with any questions or concerns. Thank you.  YOU HAD AN ENDOSCOPIC PROCEDURE TODAY AT THE [facility]:   Refer to the procedure report that was given to you for any specific questions about what was found during the examination.  If the procedure report does not answer your questions, please call your gastroenterologist to clarify.  If you requested that your care partner not be given the details of your procedure findings, then the procedure report has been included in a sealed envelope for you to review at your convenience later.  YOU SHOULD EXPECT: Some feelings of bloating in the abdomen. Passage of more gas than usual.  Walking can help get rid of the air that was put into your GI tract during the procedure and reduce the bloating. If you had a lower endoscopy (such as a colonoscopy or flexible sigmoidoscopy) you may notice spotting of blood in your stool or on the toilet paper. If you underwent a bowel prep for your procedure, you may not have a normal bowel movement for a few days.  Please Note:  You might notice some irritation and congestion in your nose or some drainage.  This is from the oxygen used during your procedure.  There is no need for concern and it should clear up in a day or so.  SYMPTOMS TO REPORT IMMEDIATELY:   Following lower endoscopy (colonoscopy or flexible sigmoidoscopy):  Excessive amounts of blood in the stool  Significant tenderness or worsening of abdominal pains  Swelling of the abdomen that is new, acute  Fever of 100F or higher   Following upper endoscopy (EGD)  Vomiting of blood or coffee ground material  New chest pain or pain under the shoulder blades  Painful or persistently difficult swallowing  New shortness of breath  Fever of 100F or higher  Black, tarry-looking stools  For urgent or emergent issues, a gastroenterologist can be reached at any hour by calling [phone].   DIET: Your first meal following the procedure should be a small meal and then it is ok to progress to your normal diet. Heavy or fried foods are harder to digest and may make you feel nauseous or bloated.  Likewise, meals heavy in dairy and vegetables can increase bloating.  Drink plenty of fluids but you should avoid alcoholic beverages for 24 hours.  ACTIVITY:  You should plan to take it easy for the rest of today and you should NOT DRIVE or use heavy machinery until tomorrow (because of the sedation medicines used during the test).    FOLLOW UP: Our staff will call the number listed on your records the next business day following your procedure to check on you and address any questions or concerns that you may have regarding the information given to you following your procedure. If we do not reach you, we will leave a message.  However, if you are feeling well and you are not experiencing any problems, there is no need to return our call.  We will assume that you have returned to your regular daily activities without incident.  If any biopsies were taken you will be contacted by phone or by letter within the next 1-3 weeks.  Please call us at [phone] if you have not heard about the biopsies in 3 weeks.    SIGNATURES/CONFIDENTIALITY: You and/or your care partner have signed paperwork which will be entered into your electronic medical record.    These signatures attest to the fact that that the information above on your After Visit Summary has been reviewed and is understood.  Full responsibility of the confidentiality of this discharge information lies with you and/or your care-partner. 

## 2015-01-29 ENCOUNTER — Telehealth: Payer: Self-pay | Admitting: *Deleted

## 2015-01-29 NOTE — Telephone Encounter (Signed)
  Follow up Call-  Call back number 01/28/2015  Post procedure Call Back phone  # [phone] or [phone]  Permission to leave phone message Yes     Patient questions:  Do you have a fever, pain , or abdominal swelling? No. Pain Score  0 *  Have you tolerated food without any problems? Yes.    Have you been able to return to your normal activities? Yes.    Do you have any questions about your discharge instructions: Diet   No. Medications  No. Follow up visit  No.  Do you have questions or concerns about your Care? No.  Actions: * If pain score is 4 or above: No action needed, pain <4.

## 2015-02-06 ENCOUNTER — Ambulatory Visit: Payer: Medicare PPO | Admitting: Infectious Diseases

## 2015-02-11 ENCOUNTER — Encounter: Payer: Self-pay | Admitting: Infectious Diseases

## 2015-02-11 ENCOUNTER — Ambulatory Visit (INDEPENDENT_AMBULATORY_CARE_PROVIDER_SITE_OTHER): Payer: Commercial Managed Care - HMO | Admitting: Infectious Diseases

## 2015-02-11 VITALS — BP 119/81 | HR 81 | Temp 98.1°F | Ht 61.0 in | Wt 153.0 lb

## 2015-02-11 DIAGNOSIS — Z23 Encounter for immunization: Secondary | ICD-10-CM | POA: Diagnosis not present

## 2015-02-11 DIAGNOSIS — B20 Human immunodeficiency virus [HIV] disease: Secondary | ICD-10-CM | POA: Diagnosis not present

## 2015-02-11 DIAGNOSIS — Z79899 Other long term (current) drug therapy: Secondary | ICD-10-CM

## 2015-02-11 DIAGNOSIS — E781 Pure hyperglyceridemia: Secondary | ICD-10-CM | POA: Diagnosis not present

## 2015-02-11 DIAGNOSIS — Z113 Encounter for screening for infections with a predominantly sexual mode of transmission: Secondary | ICD-10-CM

## 2015-02-11 NOTE — Assessment & Plan Note (Signed)
She is doing well, although her CD4 is borderline.  Will get her 3rd of 2nd series of hep B.  She is given condoms.  Will see her back in 6 months.

## 2015-02-11 NOTE — Assessment & Plan Note (Signed)
She has f/u appt with GYN/ONC.

## 2015-02-11 NOTE — Addendum Note (Signed)
Addended by: Andree CossHOWELL, Gabriana Wilmott M on: 02/11/2015 05:01 PM   Modules accepted: Orders

## 2015-02-11 NOTE — Assessment & Plan Note (Signed)
Will recheck today.

## 2015-02-11 NOTE — Progress Notes (Signed)
   Subjective:    Patient ID: Altamese DillingRenee C Deschepper, female    DOB: 05/01/1966, 49 y.o.   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 Last Genotype naive. Prev abn Genotypes.  On November 30, 2009 she underwent anterior cervical  decompression, C4-5, C5-6. Arthrodesis C4-C5, 7-mm structural allograft  and C5-C6, 6-mm structural allograft. Anterior instrumentation 30-mm  Synthes Vectra plate with 14 mm screws.  Also with hx of VAIN (high grade). Has had f/u with GYN and had been using effudex/5FU bid. She states she is off this for now.   Husband is HIV+, sees Dr Orvan Falconerampbell (on ART). Married since Dec 2014.       Mammogram 11-2014: normal. Had colonoscopy 01-2015: normal.   HIV 1 RNA QUANT (copies/mL)  Date Value  07/25/2014 <20  01/24/2014 <20  07/19/2013 <20   CD4 T CELL ABS (/uL)  Date Value  07/25/2014 200*  01/24/2014 210*  07/19/2013 200*   Review of Systems  Constitutional: Negative for appetite change and unexpected weight change.  Gastrointestinal: Negative for diarrhea and constipation.  Genitourinary: Negative for difficulty urinating.  ammenorhea     Objective:   Physical Exam  Constitutional: She appears well-developed and well-nourished.  HENT:  Mouth/Throat: No oropharyngeal exudate.  Eyes: EOM are normal. Pupils are equal, round, and reactive to light.  Neck: Neck supple.  Cardiovascular: Normal rate, regular rhythm and normal heart sounds.   Pulmonary/Chest: Effort normal and breath sounds normal.  Abdominal: Soft. Bowel sounds are normal. She exhibits no distension. There is no tenderness.  Lymphadenopathy:    She has no cervical adenopathy.       Assessment & Plan:

## 2015-02-12 LAB — COMPREHENSIVE METABOLIC PANEL
ALBUMIN: 4.5 g/dL (ref 3.5–5.2)
ALK PHOS: 62 U/L (ref 39–117)
ALT: 19 U/L (ref 0–35)
AST: 19 U/L (ref 0–37)
BUN: 13 mg/dL (ref 6–23)
CHLORIDE: 103 meq/L (ref 96–112)
CO2: 25 mEq/L (ref 19–32)
CREATININE: 1.03 mg/dL (ref 0.50–1.10)
Calcium: 9.5 mg/dL (ref 8.4–10.5)
Glucose, Bld: 93 mg/dL (ref 70–99)
Potassium: 4.2 mEq/L (ref 3.5–5.3)
Sodium: 137 mEq/L (ref 135–145)
Total Bilirubin: 0.4 mg/dL (ref 0.2–1.2)
Total Protein: 7.8 g/dL (ref 6.0–8.3)

## 2015-02-12 LAB — CBC
HCT: 36.1 % (ref 36.0–46.0)
Hemoglobin: 12 g/dL (ref 12.0–15.0)
MCH: 31.7 pg (ref 26.0–34.0)
MCHC: 33.2 g/dL (ref 30.0–36.0)
MCV: 95.3 fL (ref 78.0–100.0)
MPV: 10.2 fL (ref 8.6–12.4)
PLATELETS: 202 10*3/uL (ref 150–400)
RBC: 3.79 MIL/uL — ABNORMAL LOW (ref 3.87–5.11)
RDW: 12.6 % (ref 11.5–15.5)
WBC: 3.4 10*3/uL — ABNORMAL LOW (ref 4.0–10.5)

## 2015-02-12 LAB — LIPID PANEL
CHOL/HDL RATIO: 4 ratio
Cholesterol: 175 mg/dL (ref 0–200)
HDL: 44 mg/dL — ABNORMAL LOW (ref >=46)
LDL CALC: 96 mg/dL (ref 0–99)
Triglycerides: 175 mg/dL — ABNORMAL HIGH (ref <150)
VLDL: 35 mg/dL (ref 0–40)

## 2015-02-12 LAB — T-HELPER CELL (CD4) - (RCID CLINIC ONLY)
CD4 T CELL ABS: 190 /uL — AB (ref 400–2700)
CD4 T CELL HELPER: 17 % — AB (ref 33–55)

## 2015-02-12 LAB — HIV-1 RNA QUANT-NO REFLEX-BLD

## 2015-02-12 LAB — RPR

## 2015-02-22 ENCOUNTER — Telehealth: Payer: Self-pay | Admitting: *Deleted

## 2015-02-22 NOTE — Telephone Encounter (Signed)
Notified pt on scheduled appointment 04/03/15 @ 10:30 with Dr. Adolphus BirchwoodEmma Smith. Pt agreed with time and date.

## 2015-03-26 ENCOUNTER — Other Ambulatory Visit: Payer: Self-pay | Admitting: Internal Medicine

## 2015-03-26 ENCOUNTER — Other Ambulatory Visit: Payer: Self-pay | Admitting: Infectious Diseases

## 2015-03-26 DIAGNOSIS — B20 Human immunodeficiency virus [HIV] disease: Secondary | ICD-10-CM

## 2015-03-29 ENCOUNTER — Ambulatory Visit: Payer: Commercial Managed Care - HMO | Admitting: Gynecologic Oncology

## 2015-03-29 ENCOUNTER — Other Ambulatory Visit: Payer: Self-pay | Admitting: Infectious Diseases

## 2015-04-03 ENCOUNTER — Ambulatory Visit: Payer: Commercial Managed Care - HMO | Attending: Gynecologic Oncology | Admitting: Gynecologic Oncology

## 2015-04-03 ENCOUNTER — Other Ambulatory Visit (HOSPITAL_COMMUNITY)
Admission: RE | Admit: 2015-04-03 | Discharge: 2015-04-03 | Disposition: A | Discharge status: Home / Self Care | Payer: Commercial Managed Care - HMO | Source: Ambulatory Visit | Attending: Gynecologic Oncology | Admitting: Gynecologic Oncology

## 2015-04-03 ENCOUNTER — Encounter: Payer: Self-pay | Admitting: Gynecologic Oncology

## 2015-04-03 VITALS — BP 111/84 | HR 80 | Temp 98.2°F | Resp 18 | Ht 61.0 in | Wt 152.3 lb

## 2015-04-03 DIAGNOSIS — N893 Dysplasia of vagina, unspecified: Secondary | ICD-10-CM

## 2015-04-03 DIAGNOSIS — Z90710 Acquired absence of both cervix and uterus: Secondary | ICD-10-CM | POA: Diagnosis not present

## 2015-04-03 DIAGNOSIS — B20 Human immunodeficiency virus [HIV] disease: Secondary | ICD-10-CM | POA: Diagnosis not present

## 2015-04-03 DIAGNOSIS — D072 Carcinoma in situ of vagina: Secondary | ICD-10-CM | POA: Diagnosis not present

## 2015-04-03 DIAGNOSIS — D071 Carcinoma in situ of vulva: Secondary | ICD-10-CM | POA: Diagnosis not present

## 2015-04-03 DIAGNOSIS — Z01411 Encounter for gynecological examination (general) (routine) with abnormal findings: Secondary | ICD-10-CM | POA: Diagnosis not present

## 2015-04-03 DIAGNOSIS — I10 Essential (primary) hypertension: Secondary | ICD-10-CM | POA: Diagnosis not present

## 2015-04-03 NOTE — Patient Instructions (Signed)
We will call you with the results of your biopsy and pap smear.  Plan to follow up in three months or sooner if the area biopsied needs to be removed.

## 2015-04-03 NOTE — Progress Notes (Signed)
Consult Note: Gyn-Onc   Cathy Smith 49 y.o. female  Chief Complaint  Patient presents with  . 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 Biopsy result pending.  Plan:   Pap smears obtained. If it is normal or shows only low-grade SIL we will have the patient return in 3 months for repeat Pap smear. If the Pap smear shows high-grade changes then I would like to retreat her with Efudex for 2 additional months. If her vulvar biopsy shows high grade dysplasia I would recommend excision or laser procedure in the OR.  Interval History Patient returns today as previously scheduled. Her last Pap smear in February 2016 showed ASCUS + hrHPV. She has not had any further Efudex in the last 6 months. Otherwise she denies any pelvic symptoms.  She denies any vaginal or vulvar pruritus bleeding or discharge.  HPI:49 year old [place] female seen in consultation requested of [name], and Dr. Marice Potterove regarding management of a newly diagnosed severe dysplasia of the upper vagina. Briefly, the patient's history includes having a past history of abdominal hysterectomy for what we understand and have been fibroids. She denies any problems with Pap smears prior to the hysterectomy. More recently she has had some abnormal Pap smears ultimately resulting in colposcopy and directed biopsy on 04/07/2012. 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  The patient did well until March 2015 with a Pap smear showing high-grade dysplasia once again. In April and May 2015 the patient received 2 additional months of Efudex. From June 2015 through February 2016 her Pap smears only showed low-grade SIL.   Review of Systems:10 point  review of systems is negative as noted above.   Vitals: Blood pressure 111/84, pulse 80, temperature 98.2 F (36.8 C), temperature source Oral, resp. rate 18, height 5\' 1"  (1.549 m), weight 152 lb 4.8 oz (69.083 kg).  Physical Exam: General : The patient is a healthy woman in no acute distress.  HEENT: normocephalic, extraoccular movements normal; neck is supple without thyromegally  Lynphnodes: Supraclavicular and inguinal nodes not enlarged  Abdomen: Soft, non-tender, no ascites, no organomegally, no masses, no hernias  Pelvic:  EGBUS: Normal female there are areas of white epithelium on the vulva consistent with VIN. There is a lesion on the right anterior labia majora that is slightly more nodular than the surrounding tissue. This was biopsied Vagina: Normal, no lesions  Urethra and Bladder: Normal, non-tender  Cervix: Surgically absent  Uterus: Surgically absent  Bi-manual examination: Non-tender; no adenxal masses or nodularity  Rectal: normal sphincter tone, no masses, no blood  Lower extremities: No edema or varicosities. Normal range of motion   PROCEDURE:  Vulvar Biopsy The patient provided verbal consent. The right vulva was prepped with betadine. It was infiltrated with 1cc of 1% lidocaine. A 3mm punch was used to take a biopsy from the center of the lesion. Hemostasis was obtained with silver nitrate.    No Known Allergies  Past Medical History  Diagnosis Date  . HIV infection   . Hypertension   . Hyperlipidemia     Past Surgical History  Procedure Laterality Date  . Thyroid surgery    . Abdominal hysterectomy      Current Outpatient Prescriptions  Medication Sig Dispense Refill  .  emtricitabine-tenofovir (TRUVADA) 200-300 MG per tablet TAKE 1 TABLET BY MOUTH DAILY 30 tablet 5  . fenofibrate (TRICOR) 48 MG tablet TAKE 1 TABLET BY MOUTH ONCE DAILY 30 tablet 0  . hydrochlorothiazide (HYDRODIURIL) 25 MG tablet Take 25 mg by mouth daily.     . raltegravir  (ISENTRESS) 400 MG tablet TAKE ONE TABLET BY MOUTH TWICE DAILY 60 tablet 5  . ritonavir (NORVIR) 100 MG TABS tablet TAKE 1 TABLET BY MOUTH EVERY DAY WITH BREAKFAST 30 tablet 5  . spironolactone (ALDACTONE) 25 MG tablet Take 1 tablet (25 mg total) by mouth daily. 30 tablet 6  . PREMARIN vaginal cream     . PREZISTA 800 MG tablet TAKE ONE TABLET BY MOUTH EVERY DAY (Patient not taking: Reported on 04/03/2015) 30 tablet 6   No current facility-administered medications for this visit.    History   Social History  . Marital Status: Married    Spouse Name: N/A  . Number of Children: N/A  . Years of Education: N/A   Occupational History  . Not on file.   Social History Main Topics  . Smoking status: Never Smoker   . Smokeless tobacco: Never Used  . Alcohol Use: No  . Drug Use: No  . Sexual Activity:    Partners: Male    Birth Control/ Protection: Condom     Comment: pt. given condoms   Other Topics Concern  . Not on file   Social History Narrative    Family History  Problem Relation Age of Onset  . Cataracts Mother   . Hypertension Father   . Colon cancer Neg Hx       Cathy Axe, MD 04/03/2015, 4:42 PM

## 2015-04-05 ENCOUNTER — Telehealth: Payer: Self-pay | Admitting: Gynecologic Oncology

## 2015-04-05 NOTE — Telephone Encounter (Signed)
Informed patient of results of VIN3. Will plan on a simple partial vulvectomy to resect this. I offered laser of the posterior vulva vs biopsies and second procedure at a later date if VIN2 or 3 is yielded. She is electign for the later (biopsies and second procedure if the biopsies show high grade dysplasia) Cathy Smith, Cathy Garron Caroline, MD

## 2015-04-10 LAB — CYTOLOGY - PAP

## 2015-04-25 ENCOUNTER — Other Ambulatory Visit: Payer: Self-pay | Admitting: Family Medicine

## 2015-04-30 ENCOUNTER — Encounter (HOSPITAL_BASED_OUTPATIENT_CLINIC_OR_DEPARTMENT_OTHER): Payer: Self-pay | Admitting: *Deleted

## 2015-04-30 NOTE — Progress Notes (Signed)
NPO AFTER MN.  ARRIVE AT 0900. NEEDS ISTAT.  CURRENT EKG IN CHART AND EPIC.

## 2015-05-06 ENCOUNTER — Encounter (HOSPITAL_BASED_OUTPATIENT_CLINIC_OR_DEPARTMENT_OTHER): Payer: Self-pay

## 2015-05-06 ENCOUNTER — Ambulatory Visit (HOSPITAL_BASED_OUTPATIENT_CLINIC_OR_DEPARTMENT_OTHER)
Admission: RE | Admit: 2015-05-06 | Discharge: 2015-05-06 | Disposition: A | Discharge status: Home / Self Care | Payer: Commercial Managed Care - HMO | Source: Ambulatory Visit | Attending: Gynecologic Oncology | Admitting: Gynecologic Oncology

## 2015-05-06 ENCOUNTER — Encounter (HOSPITAL_BASED_OUTPATIENT_CLINIC_OR_DEPARTMENT_OTHER)
Admission: RE | Disposition: A | Discharge status: Home / Self Care | Payer: Self-pay | Source: Ambulatory Visit | Attending: Gynecologic Oncology

## 2015-05-06 ENCOUNTER — Ambulatory Visit (HOSPITAL_BASED_OUTPATIENT_CLINIC_OR_DEPARTMENT_OTHER): Payer: Commercial Managed Care - HMO | Admitting: Anesthesiology

## 2015-05-06 DIAGNOSIS — D071 Carcinoma in situ of vulva: Secondary | ICD-10-CM | POA: Diagnosis not present

## 2015-05-06 DIAGNOSIS — Z90710 Acquired absence of both cervix and uterus: Secondary | ICD-10-CM | POA: Insufficient documentation

## 2015-05-06 DIAGNOSIS — Z21 Asymptomatic human immunodeficiency virus [HIV] infection status: Secondary | ICD-10-CM | POA: Insufficient documentation

## 2015-05-06 DIAGNOSIS — N903 Dysplasia of vulva, unspecified: Secondary | ICD-10-CM | POA: Diagnosis not present

## 2015-05-06 DIAGNOSIS — I10 Essential (primary) hypertension: Secondary | ICD-10-CM | POA: Diagnosis not present

## 2015-05-06 DIAGNOSIS — E785 Hyperlipidemia, unspecified: Secondary | ICD-10-CM | POA: Diagnosis not present

## 2015-05-06 HISTORY — PX: VULVECTOMY: SHX1086

## 2015-05-06 HISTORY — DX: Pure hyperglyceridemia: E78.1

## 2015-05-06 HISTORY — DX: Presence of spectacles and contact lenses: Z97.3

## 2015-05-06 HISTORY — DX: Personal history of other infectious and parasitic diseases: Z86.19

## 2015-05-06 HISTORY — DX: Personal history of other benign neoplasm: Z86.018

## 2015-05-06 HISTORY — DX: Personal history of vaginal dysplasia: Z87.411

## 2015-05-06 LAB — POCT I-STAT 4, (NA,K, GLUC, HGB,HCT)
Glucose, Bld: 111 mg/dL — ABNORMAL HIGH (ref 65–99)
HEMATOCRIT: 37 % (ref 36.0–46.0)
Hemoglobin: 12.6 g/dL (ref 12.0–15.0)
Potassium: 3.7 mmol/L (ref 3.5–5.1)
Sodium: 140 mmol/L (ref 135–145)

## 2015-05-06 SURGERY — WIDE EXCISION VULVECTOMY
Anesthesia: General | Site: Vulva | Laterality: Right

## 2015-05-06 MED ORDER — MIDAZOLAM HCL 2 MG/2ML IJ SOLN
INTRAMUSCULAR | Status: AC
  Filled 2015-05-06: qty 2

## 2015-05-06 MED ORDER — PROMETHAZINE HCL 25 MG/ML IJ SOLN
6.2500 mg | INTRAMUSCULAR | Status: DC | PRN
  Filled 2015-05-06: qty 1

## 2015-05-06 MED ORDER — FENTANYL CITRATE (PF) 100 MCG/2ML IJ SOLN
INTRAMUSCULAR | Status: AC
  Filled 2015-05-06: qty 4

## 2015-05-06 MED ORDER — DOCUSATE SODIUM 100 MG PO CAPS: 100.0000 mg | ORAL_CAPSULE | Freq: Two times a day (BID) | ORAL | Status: DC

## 2015-05-06 MED ORDER — LACTATED RINGERS IV SOLN
INTRAVENOUS | Status: DC
  Administered 2015-05-06 (×2): via INTRAVENOUS
  Filled 2015-05-06: qty 1000

## 2015-05-06 MED ORDER — KETAMINE HCL 50 MG/ML IJ SOLN
INTRAMUSCULAR | Status: AC
  Filled 2015-05-06: qty 10

## 2015-05-06 MED ORDER — ONDANSETRON HCL 4 MG/2ML IJ SOLN
INTRAMUSCULAR | Status: DC | PRN
  Administered 2015-05-06: 4 mg via INTRAVENOUS

## 2015-05-06 MED ORDER — LIDOCAINE HCL 1 % IJ SOLN
INTRAMUSCULAR | Status: DC | PRN
  Administered 2015-05-06: 19 mL

## 2015-05-06 MED ORDER — FENTANYL CITRATE (PF) 100 MCG/2ML IJ SOLN
25.0000 ug | INTRAMUSCULAR | Status: DC | PRN
  Filled 2015-05-06: qty 1

## 2015-05-06 MED ORDER — FENTANYL CITRATE (PF) 100 MCG/2ML IJ SOLN
INTRAMUSCULAR | Status: DC | PRN
Start: 2015-05-06 — End: 2015-05-06
  Administered 2015-05-06: 100 ug via INTRAVENOUS

## 2015-05-06 MED ORDER — LIDOCAINE HCL (CARDIAC) 20 MG/ML IV SOLN
INTRAVENOUS | Status: DC | PRN
  Administered 2015-05-06: 50 mg via INTRAVENOUS

## 2015-05-06 MED ORDER — PROPOFOL 10 MG/ML IV BOLUS
INTRAVENOUS | Status: DC | PRN
  Administered 2015-05-06: 200 mg via INTRAVENOUS

## 2015-05-06 MED ORDER — OXYCODONE-ACETAMINOPHEN 5-325 MG PO TABS: 1.0000 | ORAL_TABLET | ORAL | Status: DC | PRN

## 2015-05-06 MED ORDER — MIDAZOLAM HCL 5 MG/5ML IJ SOLN
INTRAMUSCULAR | Status: DC | PRN
  Administered 2015-05-06: 2 mg via INTRAVENOUS

## 2015-05-06 MED ORDER — MIDAZOLAM HCL 2 MG/ML PO SYRP
6.5000 mg | ORAL_SOLUTION | Freq: Once | ORAL | Status: DC
  Filled 2015-05-06: qty 4

## 2015-05-06 MED ORDER — ACETIC ACID 5 % SOLN
Status: DC | PRN
  Administered 2015-05-06: 1 via TOPICAL

## 2015-05-06 SURGICAL SUPPLY — 52 items
APPLICATOR COTTON TIP 6IN STRL (MISCELLANEOUS) IMPLANT
BLADE CLIPPER SURG (BLADE) ×3 IMPLANT
BLADE SURG 15 STRL LF DISP TIS (BLADE) ×1 IMPLANT
BLADE SURG 15 STRL SS (BLADE) ×2
BNDG GAUZE ELAST 4 BULKY (GAUZE/BANDAGES/DRESSINGS) ×1 IMPLANT
BRIEF STRETCH FOR OB PAD LRG (UNDERPADS AND DIAPERS) ×2 IMPLANT
CANISTER SUCTION 2500CC (MISCELLANEOUS) ×2 IMPLANT
CATH FOLEY 2WAY SLVR  5CC 14FR (CATHETERS)
CATH FOLEY 2WAY SLVR 5CC 14FR (CATHETERS) IMPLANT
CATH ROBINSON RED A/P 14FR (CATHETERS) IMPLANT
CLEANER INSTRU ENZYM VALS 1GL (STERILIZATION PRODUCTS) ×1 IMPLANT
COVER BACK TABLE 60X90IN (DRAPES) ×2 IMPLANT
DRAPE LG THREE QUARTER DISP (DRAPES) ×2 IMPLANT
DRAPE UNDERBUTTOCKS STRL (DRAPE) ×2 IMPLANT
GAUZE SPONGE 4X4 12PLY STRL (GAUZE/BANDAGES/DRESSINGS) IMPLANT
GAUZE SPONGE 4X4 16PLY XRAY LF (GAUZE/BANDAGES/DRESSINGS) IMPLANT
GLOVE BIO SURGEON STRL SZ 6 (GLOVE) ×4 IMPLANT
GLOVE SURG SS PI 7.5 STRL IVOR (GLOVE) ×2 IMPLANT
GOWN STRL REUS W/TWL LRG LVL3 (GOWN DISPOSABLE) ×1 IMPLANT
GOWN STRL REUS W/TWL XL LVL3 (GOWN DISPOSABLE) ×1 IMPLANT
LEGGING LITHOTOMY PAIR STRL (DRAPES) ×2 IMPLANT
MANIFOLD NEPTUNE II (INSTRUMENTS) IMPLANT
NDL HYPO 25X1 1.5 SAFETY (NEEDLE) ×1 IMPLANT
NEEDLE HYPO 22GX1.5 SAFETY (NEEDLE) IMPLANT
NEEDLE HYPO 25X1 1.5 SAFETY (NEEDLE) ×2 IMPLANT
NS IRRIG 500ML POUR BTL (IV SOLUTION) ×2 IMPLANT
PACK BASIN DAY SURGERY FS (CUSTOM PROCEDURE TRAY) ×2 IMPLANT
PACK COOL COMFORT PERI COLD (SET/KITS/TRAYS/PACK) ×1 IMPLANT
PAD OB MATERNITY 4.3X12.25 (PERSONAL CARE ITEMS) ×2 IMPLANT
PENCIL BUTTON HOLSTER BLD 10FT (ELECTRODE) ×2 IMPLANT
SCOPETTES 8  STERILE (MISCELLANEOUS)
SCOPETTES 8 STERILE (MISCELLANEOUS) IMPLANT
SUT VIC AB 0 SH 27 (SUTURE) ×2 IMPLANT
SUT VIC AB 2-0 CT2 27 (SUTURE) IMPLANT
SUT VIC AB 2-0 SH 27 (SUTURE)
SUT VIC AB 2-0 SH 27X BRD (SUTURE) IMPLANT
SUT VIC AB 3-0 PS2 18 (SUTURE)
SUT VIC AB 3-0 PS2 18XBRD (SUTURE) IMPLANT
SUT VIC AB 3-0 SH 27 (SUTURE) ×4
SUT VIC AB 3-0 SH 27X BRD (SUTURE) ×1 IMPLANT
SUT VICRYL 2 0 18  UND BR (SUTURE)
SUT VICRYL 2 0 18 UND BR (SUTURE) IMPLANT
SUT VICRYL 4-0 PS2 18IN ABS (SUTURE) ×2 IMPLANT
SYR BULB IRRIGATION 50ML (SYRINGE) ×2 IMPLANT
SYRINGE CONTROL L 12CC (SYRINGE) ×2 IMPLANT
SYRINGE CONTROL LL 12CC (SYRINGE) ×1 IMPLANT
TOWEL OR 17X24 6PK STRL BLUE (TOWEL DISPOSABLE) ×2 IMPLANT
TRAY DSU PREP LF (CUSTOM PROCEDURE TRAY) ×2 IMPLANT
TUBE CONNECTING 12X1/4 (SUCTIONS) ×2 IMPLANT
UNDERPAD 30X30 INCONTINENT (UNDERPADS AND DIAPERS) ×2 IMPLANT
WATER STERILE IRR 500ML POUR (IV SOLUTION) ×1 IMPLANT
YANKAUER SUCT BULB TIP NO VENT (SUCTIONS) ×2 IMPLANT

## 2015-05-06 NOTE — Discharge Instructions (Signed)
Vulvectomy, Care After °The vulva is the external female genitalia, outside and around the vagina and pubic bone. It consists of: °· The skin on, and in front of, the pubic bone. °· The clitoris. °· The labia majora (large lips) on the outside of the vagina. °· The labia minora (small lips) around the opening of the vagina. °· The opening and the skin in and around the vagina. °A vulvectomy is the removal of the tissue of the vulva, which sometimes includes removal of the lymph nodes and tissue in the groin areas. °These discharge instructions provide you with general information on caring for yourself after you leave the hospital. It is also important that you know the warning signs of complications, so that you can seek treatment. Please read the instructions outlined below and refer to this sheet in the next few weeks. Your caregiver may also give you specific information and medicines. If you have any questions or complications after discharge, please call your caregiver. °ACTIVITY °· Rest as much as possible the first two weeks after discharge. °· Arrange to have help from family or others with your daily activities when you go home. °· Avoid heavy lifting (more than 5 pounds), pushing, or pulling. °· If you feel tired, balance your activity with rest periods. °· Follow your caregiver's instruction about climbing stairs and driving a car. °· Increase activity gradually. °· Do not exercise until you have permission from your caregiver. °LEG AND FOOT CARE °If your doctor has removed lymph nodes from your groin area, there may be an increase in swelling of your legs and feet. You can help prevent swelling by doing the following: °· Elevate your legs while sitting or lying down. °· If your caregiver has ordered special stockings, wear them according to instructions. °· Avoid standing in one place for long periods of time. °· Call the physical therapy department if you have any questions about swelling or treatment  for swelling. °· Avoid salt in your diet. It can cause fluid retention and swelling. °· Do not cross your legs, especially when sitting. °NUTRITION °· You may resume your normal diet. °· Drink 6 to 8 glasses of fluids a day. °· Eat a healthy, balanced diet including portions of food from the meat (protein), milk, fruit, vegetable, and bread groups. °· Your caregiver may recommend you take a multivitamin with iron. °ELIMINATION °· You may notice that your stream of urine is at a different angle, and may tend to spray. Using a plastic funnel may help to decrease urine spray. °· If constipation occurs, drink more liquids, and add more fruits, vegetables, and bran to your diet. You may take a mild laxative, such as Milk of Magnesia, Metamucil, or a stool softener such as Colace, with permission from your caregiver. °HYGIENE °· You may shower and wash your hair. °· Check with your caregiver about tub baths. °· Do not add any bath oils or chemicals to your bath water, after you have permission to take baths. °· While passing urine, pour water from a bottle or spray over your vulva to dilute the urine as it passes the incision (this will decrease burning and discomfort). °· Clean yourself well after moving your bowels. °· After urinating, do not wipe. Dap or pat dry with toilet paper or a dry cleath soft cloth. °· A sitz bath will help keep your perineal area clean, reduce swelling, and provide comfort. °· Avoid wearing underpants for the first 2 weeks and wear loose skirts to   allow circulation of air around the incision °· You do not need to apply dressings, salves or lotions to the wound. °· The stitches are self-dissolving and will absorb and disappear over a couple of months (it is normal to notice the knot from the stitches on toilet paper after voiding). °HOME CARE INSTRUCTIONS  °· Apply a soft ice pack (or frozen bag of peas) to your perineum (vulva) every hour in the first 48 hours after surgery. This will reduce  swelling. °· Avoid activities that involve a lot of friction between your legs. °· Avoid wearing pants or underpants in the 1st 2 weeks (skirts are preferable). °· Take your temperature twice a day and record it, especially if you feel feverish or have chills. °· Follow your caregiver's instructions about medicines, activity, and follow-up appointments after surgery. °· Do not drink alcohol while taking pain medicine. °· Change your dressing as advised by your caregiver. °· You may take over-the-counter medicine for pain, recommended by your caregiver. °· If your pain is not relieved with medicine, call your caregiver. °· Do not take aspirin because it can cause bleeding. °· Do not douche or use tampons (use a nonperfumed sanitary pad). °· Do not have sexual intercourse until your caregiver gives you permission (typically 6 weeks postoperatively). Hugging, kissing, and playful sexual activity is fine with your caregiver's permission. °· Warm sitz baths, with your caregiver's permission, are helpful to control swelling and discomfort. °· Take showers instead of baths, until your caregiver gives you permission to take baths. °· You may take a mild medicine for constipation, recommended by your caregiver. Bran foods and drinking a lot of fluids will help with constipation. °· Make sure your family understands everything about your operation and recovery. °SEEK MEDICAL CARE IF:  °· You notice swelling and redness around the wound area. °· You notice a foul smell coming from the wound or on the surgical dressing. °· You notice the wound is separating. °· You have painful or bloody urination. °· You develop nausea and vomiting. °· You develop diarrhea. °· You develop a rash. °· You have a reaction or allergy from the medicine. °· You feel dizzy or light-headed. °· You need stronger pain medicine. °SEEK IMMEDIATE MEDICAL CARE IF:  °· You develop a temperature of 102° F (38.9° C) or higher. °· You pass out. °· You develop  leg or chest pain. °· You develop abdominal pain. °· You develop shortness of breath. °· You develop bleeding from the wound area. °· You see pus in the wound area. °MAKE SURE YOU:  °· Understand these instructions. °· Will watch your condition. °· Will get help right away if you are not doing well or get worse. °Document Released: 06/09/2004 Document Revised: 03/12/2014 Document Reviewed: 09/27/2009 °[employer]® Patient Information ©2015 [employer]. This information is not intended to replace advice given to you by your health care provider. Make sure you discuss any questions you have with your health care provider. °Post Anesthesia Home Care Instructions ° °Activity: °Get plenty of rest for the remainder of the day. A responsible adult should stay with you for 24 hours following the procedure.  °For the next 24 hours, DO NOT: °-Drive a car °-Operate machinery °-Drink alcoholic beverages °-Take any medication unless instructed by your physician °-Make any legal decisions or sign important papers. ° °Meals: °Start with liquid foods such as gelatin or soup. Progress to regular foods as tolerated. Avoid greasy, spicy, heavy foods. If nausea and/or vomiting occur, drink   only clear liquids until the nausea and/or vomiting subsides. Call your physician if vomiting continues. ° °Special Instructions/Symptoms: °Your throat may feel dry or sore from the anesthesia or the breathing tube placed in your throat during surgery. If this causes discomfort, gargle with warm salt water. The discomfort should disappear within 24 hours. ° °If you had a scopolamine patch placed behind your ear for the management of post- operative nausea and/or vomiting: ° °1. The medication in the patch is effective for 72 hours, after which it should be removed.  Wrap patch in a tissue and discard in the trash. Wash hands thoroughly with soap and water. °2. You may remove the patch earlier than 72 hours if you experience unpleasant side effects  which may include dry mouth, dizziness or visual disturbances. °3. Avoid touching the patch. Wash your hands with soap and water after contact with the patch. °  ° °

## 2015-05-06 NOTE — H&P (Signed)
Cathy Smith 49 y.o. female  Chief Complaint  Patient presents with  . 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 Vulvar dysplasia - VIN3.  Plan: I recommend excision in the OR.  Interval History Patient returns for surgery as previously scheduled. Her last Pap smear in February 2016 showed ASCUS + hrHPV. She has not had any further Efudex in the last 6 months. Otherwise she denies any pelvic symptoms.  She denies any vaginal or vulvar pruritus bleeding or discharge.  Her vulvar biopsy from May, 2016 revealed VIN3.  HPI:49 year old [place] female seen in consultation requested of [name], and [name] regarding management of a newly diagnosed severe dysplasia of the upper vagina. Briefly, the patient's history includes having a past history of abdominal hysterectomy for what we understand and have been fibroids. She denies any problems with Pap smears prior to the hysterectomy. More recently she has had some abnormal Pap smears ultimately resulting in colposcopy and directed biopsy on 04/07/2012. 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  The patient did well until March 2015 with a Pap smear showing high-grade dysplasia once again. In April and May 2015 the patient received 2 additional months of Efudex. From June 2015 through February 2016 her Pap smears only showed low-grade SIL.   Review of Systems:10 point review of systems is negative as noted above.   Vitals: Blood pressure 111/84, pulse 80, temperature 98.2 F (36.8 C), temperature source Oral, resp. rate 18, height  (1.549 m), weight 152 lb 4.8 oz (69.083 kg).  Physical Exam: General : The patient is a healthy woman in no acute distress.  HEENT:  normocephalic, extraoccular movements normal; neck is supple without thyromegally  Lynphnodes: Supraclavicular and inguinal nodes not enlarged  Abdomen: Soft, non-tender, no ascites, no organomegally, no masses, no hernias  Pelvic:  EGBUS: Normal female there are areas of white epithelium on the vulva consistent with VIN. There is a lesion on the right anterior labia majora that is slightly more nodular than the surrounding tissue. This was biopsied in May, 2016. Vagina: Normal, no lesions  Urethra and Bladder: Normal, non-tender  Cervix: Surgically absent  Uterus: Surgically absent  Bi-manual examination: Non-tender; no adenxal masses or nodularity  Rectal: normal sphincter tone, no masses, no blood  Lower extremities: No edema or varicosities. Normal range of motion   No Known Allergies  Past Medical History  Diagnosis Date  . HIV infection   . Hypertension   . Hyperlipidemia     Past Surgical History  Procedure Laterality Date  . Thyroid surgery    . Abdominal hysterectomy      Current Outpatient Prescriptions  Medication Sig Dispense Refill  . emtricitabine-tenofovir (TRUVADA) 200-300 MG per tablet TAKE 1 TABLET BY MOUTH DAILY 30 tablet 5  . fenofibrate (TRICOR) 48 MG tablet TAKE 1 TABLET BY MOUTH ONCE DAILY 30 tablet 0  . hydrochlorothiazide (HYDRODIURIL) 25 MG tablet Take 25 mg by mouth daily.     . raltegravir (ISENTRESS) 400 MG tablet TAKE ONE TABLET BY MOUTH TWICE DAILY 60 tablet 5  . ritonavir (NORVIR) 100 MG TABS tablet TAKE 1 TABLET BY MOUTH EVERY DAY WITH BREAKFAST 30 tablet 5  . spironolactone (ALDACTONE) 25 MG tablet Take 1 tablet (25 mg total) by mouth daily. 30 tablet  6  . PREMARIN vaginal cream     . PREZISTA 800 MG tablet TAKE ONE TABLET BY MOUTH EVERY DAY (Patient not taking: Reported on 04/03/2015) 30 tablet 6   No current facility-administered medications for this visit.     History   Social History  . Marital Status: Married    Spouse Name: N/A  . Number of Children: N/A  . Years of Education: N/A   Occupational History  . Not on file.   Social History Main Topics  . Smoking status: Never Smoker   . Smokeless tobacco: Never Used  . Alcohol Use: No  . Drug Use: No  . Sexual Activity:    Partners: Male    Birth Control/ Protection: Condom     Comment: pt. given condoms   Other Topics Concern  . Not on file   Social History Narrative    Family History  Problem Relation Age of Onset  . Cataracts Mother   . Hypertension Father   . Colon cancer Neg Hx       Quinn Axe, MD

## 2015-05-06 NOTE — Transfer of Care (Signed)
Immediate Anesthesia Transfer of Care Note  Patient: Cathy Smith  Procedure(s) Performed: Procedure(s): WIDE LOCAL  EXCISION  OF RIGHT VULVA (Right)  Patient Location: PACU  Anesthesia Type:General  Level of Consciousness: awake, alert , oriented and patient cooperative  Airway & Oxygen Therapy: Patient Spontanous Breathing and Patient connected to nasal cannula oxygen  Post-op Assessment: Report given to RN and Post -op Vital signs reviewed and stable  Post vital signs: Reviewed and stable  Last Vitals:  Filed Vitals:   05/06/15 0930  BP: 140/86  Pulse: 83  Temp: 36.6 C  Resp: 12    Complications: No apparent anesthesia complications

## 2015-05-06 NOTE — Op Note (Signed)
PATIENT: Cathy Smith ENCOUNTER DATE: 05/06/15   Preop Diagnosis: VIN3  Postoperative Diagnosis: same  Surgery: Partial simple right simple partial vulvectomy, vulvar biopsies  Surgeons:  [name], Ademide Schaberg Caroline, MD Assistant: none  Anesthesia: General    Estimated blood loss: 10 ml  IVF:  200ml   Urine output: 50 ml   Complications: None   Pathology: right anterior labia majora with marking stitch at anterior margin, posterior left labia minora biopsy, posterior right labia minora biopsy  Operative findings: acetowhite changes covering entire labia. Nodular raised area on right anterior vulva measuring 4cm. Slightly verrucous raised changes at the labia minora posteriorally bilaterally (biopsied).   Procedure: The patient was identified in the preoperative holding area. Informed consent was signed on the chart. Patient was seen history was reviewed and exam was performed.   The patient was then taken to the operating room and placed in the supine position with SCD hose on. General anesthesia was then induced without difficulty. She was then placed in the dorsolithotomy position. The perineum was prepped with Betadine. The vagina was prepped with Betadine. The patient was then draped after the prep was dried. A Foley catheter was inserted into the bladder under sterile conditions.  Timeout was performed the patient, procedure, antibiotic, allergy, and length of procedure. 5% acetic acid solution was applied to the perineum. The vulvar tissues were inspected for areas of acetowhite changes or leukoplakia. The lesion was identified and the marking pen was used to circumscribe the area with appropriate surgical margins. The subcuticular tissues were infiltrated with 1% lidocaine. The 15 blade scalpel was used to make an incision through the skin circumferentially as marked. The skin elipse was grasped and was separated from the underlying deep dermal tissues with the bovie device. After the  specimen had been completely resected, it was oriented and marked at anterior margin with a 0-vicryl suture. The bovie was used to obtain hemostasis at the surgical bed. The subcutaneous tissues were irrigated and made hemostatic. The sites of concern at the right and left posterior vulva (labia minora) were biopsied and made hemostatic with the bovie.  The deep dermal layer was approximated with 3-0vicryl mattress sutures to bring the skin edges into approximation and off tension. The wound was closed following langher's lines. The cutaneous layer was closed with interrupted 4-0 vicryl stitches and mattress sutures to ensure a tension free and hemostatic closure. The perineum was again irrigated. The foley was removed.  All instrument, suture, laparotomy, Ray-Tec, and needle counts were correct x2. The patient tolerated the procedure well and was taken recovery room in stable condition. This is Cathy Smith dictating an operative note on Cathy Smith.

## 2015-05-06 NOTE — Anesthesia Preprocedure Evaluation (Addendum)
Anesthesia Evaluation  Patient identified by MRN, date of birth, ID band Patient awake    Reviewed: Allergy & Precautions, NPO status , Patient's Chart, lab work & pertinent test results  Airway Mallampati: II  TM Distance: >3 FB Neck ROM: Full    Dental no notable dental hx.    Pulmonary neg pulmonary ROS,  breath sounds clear to auscultation  Pulmonary exam normal       Cardiovascular Exercise Tolerance: Good hypertension, Pt. on medications Normal cardiovascular examRhythm:Regular Rate:Normal     Neuro/Psych negative neurological ROS  negative psych ROS   GI/Hepatic negative GI ROS, Neg liver ROS,   Endo/Other  negative endocrine ROS  Renal/GU negative Renal ROS  negative genitourinary   Musculoskeletal negative musculoskeletal ROS (+)   Abdominal   Peds negative pediatric ROS (+)  Hematology negative hematology ROS (+)   Anesthesia Other Findings   Reproductive/Obstetrics negative OB ROS                           Anesthesia Physical Anesthesia Plan  ASA: III  Anesthesia Plan: General   Post-op Pain Management:    Induction: Intravenous  Airway Management Planned: LMA  Additional Equipment:   Intra-op Plan:   Post-operative Plan: Extubation in OR  Informed Consent: I have reviewed the patients History and Physical, chart, labs and discussed the procedure including the risks, benefits and alternatives for the proposed anesthesia with the patient or authorized representative who has indicated his/her understanding and acceptance.   Dental advisory given  Plan Discussed with: CRNA  Anesthesia Plan Comments:         Anesthesia Quick Evaluation

## 2015-05-06 NOTE — Anesthesia Procedure Notes (Signed)
Procedure Name: LMA Insertion Date/Time: 05/06/2015 10:43 AM Performed by: Tyrone NineSAUVE, Noriko Macari F Pre-anesthesia Checklist: Patient identified, Timeout performed, Emergency Drugs available, Suction available and Patient being monitored Patient Re-evaluated:Patient Re-evaluated prior to inductionOxygen Delivery Method: Circle system utilized Preoxygenation: Pre-oxygenation with 100% oxygen Intubation Type: IV induction Ventilation: Mask ventilation without difficulty LMA: LMA inserted LMA Size: 4.0 Number of attempts: 1 Airway Equipment and Method: Bite block Placement Confirmation: positive ETCO2 and breath sounds checked- equal and bilateral Tube secured with: Tape Dental Injury: Teeth and Oropharynx as per pre-operative assessment

## 2015-05-06 NOTE — Anesthesia Postprocedure Evaluation (Signed)
  Anesthesia Post-op Note  Patient: Cathy Smith  Procedure(s) Performed: Procedure(s) (LRB): WIDE LOCAL  EXCISION  OF RIGHT VULVA (Right)  Patient Location: PACU  Anesthesia Type: General  Level of Consciousness: awake and alert   Airway and Oxygen Therapy: Patient Spontanous Breathing  Post-op Pain: mild  Post-op Assessment: Post-op Vital signs reviewed, Patient's Cardiovascular Status Stable, Respiratory Function Stable, Patent Airway and No signs of Nausea or vomiting  Last Vitals:  Filed Vitals:   05/06/15 1230  BP: 104/86  Pulse: 69  Temp:   Resp: 16    Post-op Vital Signs: stable   Complications: No apparent anesthesia complications

## 2015-05-07 ENCOUNTER — Encounter (HOSPITAL_BASED_OUTPATIENT_CLINIC_OR_DEPARTMENT_OTHER): Payer: Self-pay | Admitting: Gynecologic Oncology

## 2015-05-08 ENCOUNTER — Telehealth: Payer: Self-pay | Admitting: *Deleted

## 2015-05-08 NOTE — Telephone Encounter (Signed)
Patient notified of f/u appointment with Dr. Andrey Farmerossi on 05/20/15 at 1:15pm - patient wrote down appt information and is agreeable to it. No other questions or concerns noted at this time.

## 2015-05-14 ENCOUNTER — Telehealth: Payer: Self-pay | Admitting: Nurse Practitioner

## 2015-05-14 NOTE — Telephone Encounter (Signed)
RN returning call from patient asking whether she can wear undergarments and when stitches will begin to dissolve. Patient had R partial simple vulvectomy and vulvar biopsies on 05/06/15. Per Warner MccreedyMelissa Cross, NP, patient informed she can wear underwear if comfortable; stitches may take 2 weeks to dissolve but is different for everyone. Informed of next apt and when to arrive. She is encouraged to call clinic with any questions or concerns. She verbalizes understanding.

## 2015-05-20 ENCOUNTER — Ambulatory Visit: Payer: Commercial Managed Care - HMO | Attending: Gynecologic Oncology | Admitting: Gynecologic Oncology

## 2015-05-20 ENCOUNTER — Encounter: Payer: Self-pay | Admitting: Gynecologic Oncology

## 2015-05-20 VITALS — BP 119/80 | HR 93 | Temp 98.8°F | Resp 18 | Ht 61.0 in | Wt 149.9 lb

## 2015-05-20 DIAGNOSIS — D072 Carcinoma in situ of vagina: Secondary | ICD-10-CM | POA: Diagnosis not present

## 2015-05-20 DIAGNOSIS — N893 Dysplasia of vagina, unspecified: Secondary | ICD-10-CM | POA: Diagnosis not present

## 2015-05-20 NOTE — Progress Notes (Signed)
Consult Note: Gyn-Onc   Cathy Smith 49 y.o. female  Chief Complaint  Patient presents with  . 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  Plan:   Needs vulvar CO2 laser of bilateral posterior labia majora and introitus. Will schedule for September. WLE site healing appropriately.  Interval History Patient returns today for postoperative evaluation. She has no complaints.  Her last Pap smear in February 2016 showed ASCUS + hrHPV.   HPI:49 year old [place] female seen in consultation requested of [name], and Dr. Marice Potterove regarding management of a newly diagnosed severe dysplasia of the upper vagina. Briefly, the patient's history includes having a past history of abdominal hysterectomy for what we understand and have been fibroids. She denies any problems with Pap smears prior to the hysterectomy. More recently she has had some abnormal Pap smears ultimately resulting in colposcopy and directed biopsy on 04/07/2012. 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  The patient did well until March 2015 with a Pap smear showing high-grade dysplasia once again. In April and May 2015 the patient received 2 additional months of Efudex. From June 2015 through February 2016 her Pap smears only showed low-grade SIL.  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 She was scheduled for a wide local excision of the right anterior vulva which was performed on 05/06/15 and revealed VIN3. Margins were positive. Intraoperative evaluation showed lesions consistent with VIN3 on bilateral posterior labia. These were biopsied (but not treated at the patient's request). These  biopsies also returned as positive for VIN3.   Review of Systems:10 point review of systems is negative as noted above.   Vitals: Blood pressure 119/80, pulse 93, temperature 98.8 F (37.1 C), temperature source Oral, resp. rate 18, height 5\' 1"  (1.549 m), weight 149 lb 14.4 oz (67.994 kg), SpO2 100 %.  Physical Exam: General : The patient is a healthy woman in no acute distress.  HEENT: normocephalic, extraoccular movements normal; neck is supple without thyromegally  Lynphnodes: Supraclavicular and inguinal nodes not enlarged  Abdomen: Soft, non-tender, no ascites, no organomegally, no masses, no hernias  Pelvic:  EGBUS: Right anterior labia majora healing well. Bilateral posterior dysplastic changes in horse shoe configuration. Vagina: Normal, no lesions  Urethra and Bladder: Normal, non-tender  Cervix: Surgically absent  Uterus: Surgically absent  Bi-manual examination: Non-tender; no adenxal masses or nodularity  Rectal: normal sphincter tone, no masses, no blood  Lower extremities: No edema or varicosities. Normal range of motion      No Known Allergies  Past Medical History  Diagnosis Date  . [facility]  . Hypertension   . Hypertriglyceridemia without hypercholesterolemia   . History of condyloma acuminatum   . History of uterine fibroid   . Vulvar dysplasia   . History of vaginal dysplasia     VAIN III  . Wears glasses     Past Surgical History  Procedure Laterality Date  . Anterior cervical decomp/discectomy fusion  11-30-2009    C4 -- C6  . I & d left buttock abscess  04-28-2001  . Colonoscopy  01-28-2015  . Abdominal hysterectomy  1995 approx  . Vulvectomy Right 05/06/2015  Procedure: WIDE LOCAL  EXCISION  OF RIGHT VULVA;  Surgeon: Adolphus Birchwood, MD;  Location: [facility];  Service: Gynecology;  Laterality: Right;    Current Outpatient Prescriptions  Medication Sig Dispense Refill  .  emtricitabine-tenofovir (TRUVADA) 200-300 MG per tablet TAKE 1 TABLET BY MOUTH DAILY 30 tablet 5  . fenofibrate (TRICOR) 48 MG tablet TAKE 1 TABLET BY MOUTH DAILY 30 tablet 0  . hydrochlorothiazide (HYDRODIURIL) 25 MG tablet Take 25 mg by mouth daily.     Marland Kitchen oxyCODONE-acetaminophen (PERCOCET/ROXICET) 5-325 MG per tablet Take 1 tablet by mouth every 4 (four) hours as needed for severe pain. 30 tablet 0  . PREZISTA 800 MG tablet TAKE ONE TABLET BY MOUTH EVERY DAY 30 tablet 6  . raltegravir (ISENTRESS) 400 MG tablet TAKE ONE TABLET BY MOUTH TWICE DAILY 60 tablet 5  . ritonavir (NORVIR) 100 MG TABS tablet TAKE 1 TABLET BY MOUTH EVERY DAY WITH BREAKFAST 30 tablet 5  . spironolactone (ALDACTONE) 25 MG tablet Take 1 tablet (25 mg total) by mouth daily. 30 tablet 6  . docusate sodium (COLACE) 100 MG capsule Take 1 capsule (100 mg total) by mouth 2 (two) times daily. (Patient not taking: Reported on 05/20/2015) 30 capsule 0   No current facility-administered medications for this visit.    History   Social History  . Marital Status: Married    Spouse Name: N/A  . Number of Children: N/A  . Years of Education: N/A   Occupational History  . Not on file.   Social History Main Topics  . Smoking status: Never Smoker   . Smokeless tobacco: Never Used  . Alcohol Use: No  . Drug Use: No  . Sexual Activity:    Partners: Male    Birth Control/ Protection: Condom     Comment: pt. given condoms   Other Topics Concern  . Not on file   Social History Narrative    Family History  Problem Relation Age of Onset  . Cataracts Mother   . Hypertension Father   . Colon cancer Neg Hx       Quinn Axe, MD 05/20/2015, 2:55 PM

## 2015-05-20 NOTE — Patient Instructions (Signed)
Plan for laser procedure of the vulva with Dr. [facility]  They will give you a call one to two days prior to your procedure.  Please call for any questions or concerns.

## 2015-05-28 ENCOUNTER — Other Ambulatory Visit: Payer: Self-pay | Admitting: Infectious Diseases

## 2015-05-28 ENCOUNTER — Other Ambulatory Visit: Payer: Self-pay | Admitting: *Deleted

## 2015-05-28 DIAGNOSIS — B20 Human immunodeficiency virus [HIV] disease: Secondary | ICD-10-CM

## 2015-05-28 MED ORDER — RALTEGRAVIR POTASSIUM 400 MG PO TABS: ORAL_TABLET | ORAL | Status: DC

## 2015-05-28 MED ORDER — RITONAVIR 100 MG PO TABS: ORAL_TABLET | ORAL | Status: DC

## 2015-05-28 MED ORDER — EMTRICITABINE-TENOFOVIR DF 200-300 MG PO TABS: ORAL_TABLET | ORAL | Status: DC

## 2015-06-24 ENCOUNTER — Ambulatory Visit: Payer: Commercial Managed Care - HMO | Admitting: Gynecologic Oncology

## 2015-07-01 ENCOUNTER — Ambulatory Visit: Payer: Commercial Managed Care - HMO | Attending: Gynecologic Oncology | Admitting: Gynecologic Oncology

## 2015-07-01 ENCOUNTER — Encounter: Payer: Self-pay | Admitting: Gynecologic Oncology

## 2015-07-01 VITALS — BP 120/72 | HR 78 | Temp 98.2°F | Resp 18 | Ht 61.0 in | Wt 149.7 lb

## 2015-07-01 DIAGNOSIS — N893 Dysplasia of vagina, unspecified: Secondary | ICD-10-CM

## 2015-07-01 NOTE — Progress Notes (Signed)
Followup Note: Gyn-Onc   Cathy Smith 49 y.o. female  Chief Complaint  Patient presents with  . 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  Plan:   Needs vulvar CO2 laser of bilateral posterior labia majora and introitus. Will schedule for October.  Interval History Patient returns today for preoperative evaluation. She has no complaints.  Her last Pap smear in February 2016 showed ASCUS + hrHPV.   HPI:49 year old [place] female seen in consultation requested of [name], and [name] regarding management of a newly diagnosed severe dysplasia of the upper vagina. Briefly, the patient's history includes having a past history of abdominal hysterectomy for what we understand and have been fibroids. She denies any problems with Pap smears prior to the hysterectomy. More recently she has had some abnormal Pap smears ultimately resulting in colposcopy and directed biopsy on 04/07/2012. 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  The patient did well until March 2015 with a Pap smear showing high-grade dysplasia once again. In April and May 2015 the patient received 2 additional months of Efudex. From June 2015 through February 2016 her Pap smears only showed low-grade SIL.  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 She was scheduled for a wide local excision of the right anterior vulva which was performed on 05/06/15 and revealed VIN3. Margins were positive. Intraoperative evaluation showed lesions consistent with VIN3 on bilateral posterior labia. These were biopsied (but not treated at the patient's request). These biopsies also returned as positive  for VIN3.   Review of Systems:10 point review of systems is negative as noted above.   Vitals: Blood pressure 120/72, pulse 78, temperature 98.2 F (36.8 C), temperature source Oral, resp. rate 18, height 5\' 1"  (1.549 m), weight 149 lb 11.2 oz (67.903 kg), SpO2 99 %.  Physical Exam: General : The patient is a healthy woman in no acute distress.  HEENT: normocephalic, extraoccular movements normal; neck is supple without thyromegally  Lynphnodes: Supraclavicular and inguinal nodes not enlarged  Abdomen: Soft, non-tender, no ascites, no organomegally, no masses, no hernias  Pelvic:  EGBUS: Right anterior labia majora healed. Bilateral posterior dysplastic changes in horse shoe configuration. Vagina: Normal, no lesions  Urethra and Bladder: Normal, non-tender  Cervix: Surgically absent  Uterus: Surgically absent  Bi-manual examination: Non-tender; no adenxal masses or nodularity  Rectal: normal sphincter tone, no masses, no blood  Lower extremities: No edema or varicosities. Normal range of motion      No Known Allergies  Past Medical History  Diagnosis Date  . [facility]  . Hypertension   . Hypertriglyceridemia without hypercholesterolemia   . History of condyloma acuminatum   . History of uterine fibroid   . Vulvar dysplasia   . History of vaginal dysplasia     VAIN III  . Wears glasses     Past Surgical History  Procedure Laterality Date  . Anterior cervical decomp/discectomy fusion  11-30-2009    C4 -- C6  . I & d left buttock abscess  04-28-2001  . Colonoscopy  01-28-2015  . Abdominal hysterectomy  1995 approx  . Vulvectomy Right 05/06/2015    Procedure: WIDE LOCAL  EXCISION  OF RIGHT VULVA;  Surgeon: Adolphus Birchwood, MD;  Location: [facility];  Service: Gynecology;  Laterality: Right;    Current Outpatient Prescriptions  Medication Sig Dispense Refill  . emtricitabine-tenofovir (TRUVADA) 200-300  MG per tablet TAKE 1 TABLET BY MOUTH DAILY 30 tablet 3  . fenofibrate (TRICOR) 48 MG tablet TAKE 1 TABLET BY MOUTH DAILY 30 tablet 0  . hydrochlorothiazide (HYDRODIURIL) 25 MG tablet Take 25 mg by mouth daily.     Marland Kitchen oxyCODONE-acetaminophen (PERCOCET/ROXICET) 5-325 MG per tablet Take 1 tablet by mouth every 4 (four) hours as needed for severe pain. 30 tablet 0  . PREZISTA 800 MG tablet TAKE ONE TABLET BY MOUTH EVERY DAY 30 tablet 6  . raltegravir (ISENTRESS) 400 MG tablet TAKE ONE TABLET BY MOUTH TWICE DAILY 60 tablet 3  . ritonavir (NORVIR) 100 MG TABS tablet TAKE 1 TABLET BY MOUTH EVERY DAY WITH BREAKFAST 30 tablet 3  . spironolactone (ALDACTONE) 25 MG tablet Take 1 tablet (25 mg total) by mouth daily. 30 tablet 6  . docusate sodium (COLACE) 100 MG capsule Take 1 capsule (100 mg total) by mouth 2 (two) times daily. (Patient not taking: Reported on 05/20/2015) 30 capsule 0   No current facility-administered medications for this visit.    Social History   Social History  . Marital Status: Married    Spouse Name: N/A  . Number of Children: N/A  . Years of Education: N/A   Occupational History  . Not on file.   Social History Main Topics  . Smoking status: Never Smoker   . Smokeless tobacco: Never Used  . Alcohol Use: No  . Drug Use: No  . Sexual Activity:    Partners: Male    Birth Control/ Protection: Condom     Comment: pt. given condoms   Other Topics Concern  . Not on file   Social History Narrative    Family History  Problem Relation Age of Onset  . Cataracts Mother   . Hypertension Father   . Colon cancer Neg Hx       Quinn Axe, MD 07/01/2015, 12:01 PM

## 2015-07-01 NOTE — Patient Instructions (Signed)
Preparing for your Surgery  Plan for surgery on August 20, 2015 (at 7:30) with Dr. Andrey Farmer.  Pre-operative Testing -You will receive a phone call from presurgical testing at the surgery center 1-2 days before your surgery.     -Bring your insurance card, copy of an advanced directive if applicable, medication list  -At that visit, you will be asked to sign a consent for a possible blood transfusion in case a transfusion becomes necessary during surgery.  The need for a blood transfusion is rare but having consent is a necessary part of your care.     -You should not be taking blood thinners or aspirin at least ten days prior to surgery unless instructed by your surgeon.  Day Before Surgery at Home -You will be asked to take in only clear liquids the day before surgery.  Examples of clear liquids include broths, jello, and clear juices.  Avoid carbonated beverages. You will be advised to have nothing to eat or drink after midnight the evening before.    Your role in recovery Your role is to become active as soon as directed by your doctor, while still giving yourself time to heal.  Rest when you feel tired. You will be asked to do the following in order to speed your recovery:  - Cough and breathe deeply. This helps toclear and expand your lungs and can prevent pneumonia. You may be given a spirometer to practice deep breathing. A staff member will show you how to use the spirometer. - Do mild physical activity. Walking or moving your legs help your circulation and body functions return to normal. A staff member will help you when you try to walk and will provide you with simple exercises. Do not try to get up or walk alone the first time. - Actively manage your pain. Managing your pain lets you move in comfort. We will ask you to rate your pain on a scale of zero to 10. It is your responsibility to tell your doctor or nurse where and how much you hurt so your pain can be  treated.  Special Considerations -If you are diabetic, you may be placed on insulin after surgery to have closer control over your blood sugars to promote healing and recovery.  This does not mean that you will be discharged on insulin.  If applicable, your oral antidiabetics will be resumed when you are tolerating a solid diet.  -Your final pathology results from surgery should be available by the Friday after surgery and the results will be relayed to you when available.  Blood Transfusion Information WHAT IS A BLOOD TRANSFUSION? A transfusion is the replacement of blood or some of its parts. Blood is made up of multiple cells which provide different functions.  Red blood cells carry oxygen and are used for blood loss replacement.  White blood cells fight against infection.  Platelets control bleeding.  Plasma helps clot blood.  Other blood products are available for specialized needs, such as hemophilia or other clotting disorders. BEFORE THE TRANSFUSION  Who gives blood for transfusions?   You may be able to donate blood to be used at a later date on yourself (autologous donation).  Relatives can be asked to donate blood. This is generally not any safer than if you have received blood from a stranger. The same precautions are taken to ensure safety when a relative's blood is donated.  Healthy volunteers who are fully evaluated to make sure their blood is safe. This is  blood bank blood. Transfusion therapy is the safest it has ever been in the practice of medicine. Before blood is taken from a donor, a complete history is taken to make sure that person has no history of diseases nor engages in risky social behavior (examples are intravenous drug use or sexual activity with multiple partners). The donor's travel history is screened to minimize risk of transmitting infections, such as malaria. The donated blood is tested for signs of infectious diseases, such as HIV and hepatitis. The  blood is then tested to be sure it is compatible with you in order to minimize the chance of a transfusion reaction. If you or a relative donates blood, this is often done in anticipation of surgery and is not appropriate for emergency situations. It takes many days to process the donated blood. RISKS AND COMPLICATIONS Although transfusion therapy is very safe and saves many lives, the main dangers of transfusion include:   Getting an infectious disease.  Developing a transfusion reaction. This is an allergic reaction to something in the blood you were given. Every precaution is taken to prevent this. The decision to have a blood transfusion has been considered carefully by your caregiver before blood is given. Blood is not given unless the benefits outweigh the risks.

## 2015-07-08 ENCOUNTER — Encounter: Payer: Self-pay | Admitting: Family Medicine

## 2015-07-08 ENCOUNTER — Telehealth: Payer: Self-pay

## 2015-07-08 ENCOUNTER — Ambulatory Visit (INDEPENDENT_AMBULATORY_CARE_PROVIDER_SITE_OTHER): Payer: Commercial Managed Care - HMO | Admitting: Family Medicine

## 2015-07-08 VITALS — BP 117/74 | HR 78 | Temp 98.4°F | Resp 18 | Wt 150.0 lb

## 2015-07-08 DIAGNOSIS — E785 Hyperlipidemia, unspecified: Secondary | ICD-10-CM

## 2015-07-08 LAB — LIPID PANEL
CHOLESTEROL: 159 mg/dL (ref 125–200)
HDL: 34 mg/dL — AB (ref >=46)
LDL Cholesterol: 87 mg/dL (ref <130)
Total CHOL/HDL Ratio: 4.7 Ratio (ref <=5.0)
Triglycerides: 191 mg/dL — ABNORMAL HIGH (ref <150)
VLDL: 38 mg/dL — ABNORMAL HIGH (ref <30)

## 2015-07-08 NOTE — Patient Instructions (Signed)
We are doing a lipid panel. Will let you know about prescribing med for cholesterol after we get the labs Continue same medication for hypertension.

## 2015-07-08 NOTE — Telephone Encounter (Signed)
Called patient to report that the NP will prescribe the correct medication and dosage when the results come back from her blood draw for cholesterol.  She had called earlier asking if we had called in  Rx for her after her visit today.

## 2015-07-08 NOTE — Progress Notes (Signed)
Patient ID: [name], female   DOB: 08-03-1966, 49 y.o.   MRN: [id number]   Cathy Smith, is a 49 y.o. female  YQM:[id number]  BMW:[id number]  DOB - [birth year]  CC:  Chief Complaint  Patient presents with  . Follow-up         HPI: Cathy Smith is a 49 y.o. female here for  follow-up of Hypertension and hyperlipidemia. She has a history of HIV and is followed by [facility]. She has recently had blood work done there.She is on spirolactone 25 for her hypertension and Tricor for hyperlipidemia. These are both prescribed here but she is out. She is also c/o a 2 day history of cold symptoms including mild ST, nasal congestion and mild cough.   No Known Allergies Past Medical History  Diagnosis Date  . [facility]  . Hypertension   . Hypertriglyceridemia without hypercholesterolemia   . History of condyloma acuminatum   . History of uterine fibroid   . Vulvar dysplasia   . History of vaginal dysplasia     VAIN III  . Wears glasses    Current Outpatient Prescriptions on File Prior to Visit  Medication Sig Dispense Refill  . emtricitabine-tenofovir (TRUVADA) 200-300 MG per tablet TAKE 1 TABLET BY MOUTH DAILY 30 tablet 3  . fenofibrate (TRICOR) 48 MG tablet TAKE 1 TABLET BY MOUTH DAILY 30 tablet 0  . hydrochlorothiazide (HYDRODIURIL) 25 MG tablet Take 25 mg by mouth daily.     Marland Kitchen PREZISTA 800 MG tablet TAKE ONE TABLET BY MOUTH EVERY DAY 30 tablet 6  . raltegravir (ISENTRESS) 400 MG tablet TAKE ONE TABLET BY MOUTH TWICE DAILY 60 tablet 3  . ritonavir (NORVIR) 100 MG TABS tablet TAKE 1 TABLET BY MOUTH EVERY DAY WITH BREAKFAST 30 tablet 3  . spironolactone (ALDACTONE) 25 MG tablet Take 1 tablet (25 mg total) by mouth daily. 30 tablet 6  . oxyCODONE-acetaminophen (PERCOCET/ROXICET) 5-325 MG per tablet Take 1 tablet by mouth every 4 (four) hours as needed for severe pain. (Patient not taking: Reported on 07/08/2015) 30 tablet 0   No current  facility-administered medications on file prior to visit.   Family History  Problem Relation Age of Onset  . Cataracts Mother   . Hypertension Father   . Colon cancer Neg Hx    Social History   Social History  . Marital Status: Married    Spouse Name: N/A  . Number of Children: N/A  . Years of Education: N/A   Occupational History  . Not on file.   Social History Main Topics  . Smoking status: Never Smoker   . Smokeless tobacco: Never Used  . Alcohol Use: No  . Drug Use: No  . Sexual Activity:    Partners: Male    Birth Control/ Protection: Condom     Comment: pt. given condoms   Other Topics Concern  . Not on file   Social History Narrative    Review of Systems: Constitutional: Negative for weight change, appetite change, fatigue Skin: Negative for ulcerations. HENT: Positve for mild ST and nasal congestion currently. Eyes: Negative pain, visual disturbance Respiratory: Negative for shortness of breath. Positive for current cough Cardiovascular: Negative for chest pain, palpitations, pedal edema  Abdominal:Negative for nausea, vomiting, diarhea,constipation Genitourinary: Negative for frequency, urgency, polyuria Neurological: Negative for numbness, tingling,pain in feet Psychiatric/Behavioral: Negative for depression, anxiety, confusion   Objective:   Filed Vitals:   07/08/15 1025  BP: 117/74  Pulse: 78  Temp: 98.4 F (36.9 C)  Resp: 18    Physical Exam: Constitutional: Patient appears well-developed and well-nourished. No distress. HENT: Normocephalic, atraumatic. Oropharynx is clear and moist. There is nasal congestion present Eyes: Conjunctivae and EOM are normal. PERRLA, no scleral icterus. Neck: Normal ROM. Neck supple. No lymphadenopathy, No thyromegaly. CVS: RRR, S1/S2 +, no murmurs, no gallops, no rubs Pulmonary: Effort and breath sounds normal, no stridor, rhonchi, wheezes, rales.  Abdominal: Soft. Normoactive BS,, no distension,  tenderness, rebound or guarding.  Musculoskeletal: Normal range of motion. No edema and no tenderness.  Neuro: Alert.Normal muscle tone coordination. Non-focal Skin: Skin is warm and dry. No rash noted. Not diaphoretic. No erythema. No pallor. Psychiatric: Normal mood and affect. Behavior, judgment, thought content normal.  Lab Results  Component Value Date   WBC 3.4* 02/11/2015   HGB 12.6 05/06/2015   HCT 37.0 05/06/2015   MCV 95.3 02/11/2015   PLT 202 02/11/2015   Lab Results  Component Value Date   CREATININE 1.03 02/11/2015   BUN 13 02/11/2015   NA 140 05/06/2015   K 3.7 05/06/2015   CL 103 02/11/2015   CO2 25 02/11/2015    No results found for: HGBA1C Lipid Panel     Component Value Date/Time   CHOL 175 02/11/2015 1204   TRIG 175* 02/11/2015 1204   HDL 44* 02/11/2015 1204   CHOLHDL 4.0 02/11/2015 1204   VLDL 35 02/11/2015 1204   LDLCALC 96 02/11/2015 1204       Assessment and plan:     1. Dyslipidemia - Lipid panel -Will decide on treatment based on lipid panel. -Continue low fat, low cholesterol diet -Exercise regularly.  2. Hypertension, uncontrolled off medication - Spiralactone 25 mg #90, one po q day. 3 RF -Continue low salt diet  3. Upper respiratory infection -Have discussed symptomatic measures and indications to return for recheck.    Return in about 6 months (around 01/07/2016).  The patient was given clear instructions to go to ER or return to medical center if symptoms don't improve, worsen or new problems develop. The patient verbalized understanding       07/08/2015, 12:38 PM

## 2015-07-09 ENCOUNTER — Telehealth: Payer: Self-pay

## 2015-07-09 ENCOUNTER — Other Ambulatory Visit: Payer: Self-pay | Admitting: Family Medicine

## 2015-07-09 DIAGNOSIS — E785 Hyperlipidemia, unspecified: Secondary | ICD-10-CM

## 2015-07-09 MED ORDER — PRAVASTATIN SODIUM 40 MG PO TABS: 40.0000 mg | ORAL_TABLET | Freq: Every day | ORAL | Status: DC

## 2015-07-09 NOTE — Telephone Encounter (Signed)
Called and spoke with patient advised of high triglycerides and to stop tricor and start new medication which was already sent into [facility], patient verbalized understanding. Thanks!

## 2015-07-09 NOTE — Telephone Encounter (Signed)
-----   Message from Henrietta Hoover, NP sent at 07/09/2015  7:38 AM EDT ----- Cholesterol is OK but triglycerides a lttle high. I am going to change your medication from Tricor to simvastatin.

## 2015-07-17 ENCOUNTER — Encounter (HOSPITAL_BASED_OUTPATIENT_CLINIC_OR_DEPARTMENT_OTHER): Payer: Self-pay | Admitting: *Deleted

## 2015-07-17 NOTE — Progress Notes (Signed)
NPO AFTER MN.  ARRIVE AT 0745.  NEEDS ISTAT.  CURRENT EKG IN CHART AND EPIC.  WILL TAKE PRAVASTATIN AM DOS W/ SIPS OF WATER.

## 2015-07-23 ENCOUNTER — Ambulatory Visit (HOSPITAL_BASED_OUTPATIENT_CLINIC_OR_DEPARTMENT_OTHER): Payer: Commercial Managed Care - HMO | Admitting: Certified Registered"

## 2015-07-23 ENCOUNTER — Ambulatory Visit (HOSPITAL_BASED_OUTPATIENT_CLINIC_OR_DEPARTMENT_OTHER)
Admission: RE | Admit: 2015-07-23 | Discharge: 2015-07-23 | Disposition: A | Discharge status: Home / Self Care | Payer: Commercial Managed Care - HMO | Source: Ambulatory Visit | Attending: Gynecologic Oncology | Admitting: Gynecologic Oncology

## 2015-07-23 ENCOUNTER — Encounter (HOSPITAL_BASED_OUTPATIENT_CLINIC_OR_DEPARTMENT_OTHER)
Admission: RE | Disposition: A | Discharge status: Home / Self Care | Payer: Self-pay | Source: Ambulatory Visit | Attending: Gynecologic Oncology

## 2015-07-23 ENCOUNTER — Encounter (HOSPITAL_BASED_OUTPATIENT_CLINIC_OR_DEPARTMENT_OTHER): Payer: Self-pay | Admitting: Certified Registered"

## 2015-07-23 DIAGNOSIS — D071 Carcinoma in situ of vulva: Secondary | ICD-10-CM | POA: Diagnosis not present

## 2015-07-23 DIAGNOSIS — E781 Pure hyperglyceridemia: Secondary | ICD-10-CM | POA: Diagnosis not present

## 2015-07-23 DIAGNOSIS — I10 Essential (primary) hypertension: Secondary | ICD-10-CM | POA: Diagnosis not present

## 2015-07-23 DIAGNOSIS — D072 Carcinoma in situ of vagina: Secondary | ICD-10-CM | POA: Diagnosis not present

## 2015-07-23 DIAGNOSIS — Z79899 Other long term (current) drug therapy: Secondary | ICD-10-CM | POA: Diagnosis not present

## 2015-07-23 DIAGNOSIS — B20 Human immunodeficiency virus [HIV] disease: Secondary | ICD-10-CM | POA: Insufficient documentation

## 2015-07-23 HISTORY — PX: CO2 LASER APPLICATION: SHX5778

## 2015-07-23 LAB — POCT I-STAT 4, (NA,K, GLUC, HGB,HCT)
Glucose, Bld: 110 mg/dL — ABNORMAL HIGH (ref 65–99)
HCT: 38 % (ref 36.0–46.0)
Hemoglobin: 12.9 g/dL (ref 12.0–15.0)
Potassium: 4.2 mmol/L (ref 3.5–5.1)
SODIUM: 140 mmol/L (ref 135–145)

## 2015-07-23 SURGERY — CO2 LASER APPLICATION
Anesthesia: General | Laterality: Bilateral

## 2015-07-23 MED ORDER — FENTANYL CITRATE (PF) 100 MCG/2ML IJ SOLN
INTRAMUSCULAR | Status: AC
  Filled 2015-07-23: qty 6

## 2015-07-23 MED ORDER — SILVADENE 1 % EX CREA: 1.0000 "application " | TOPICAL_CREAM | Freq: Four times a day (QID) | CUTANEOUS | Status: DC | PRN

## 2015-07-23 MED ORDER — LACTATED RINGERS IV SOLN
INTRAVENOUS | Status: DC
  Administered 2015-07-23 (×2): via INTRAVENOUS
  Filled 2015-07-23: qty 1000

## 2015-07-23 MED ORDER — ACETIC ACID 5 % SOLN
Status: DC | PRN
  Administered 2015-07-23 (×3): 1 via TOPICAL

## 2015-07-23 MED ORDER — ONDANSETRON HCL 4 MG/2ML IJ SOLN
INTRAMUSCULAR | Status: DC | PRN
Start: 2015-07-23 — End: 2015-07-23
  Administered 2015-07-23: 4 mg via INTRAVENOUS

## 2015-07-23 MED ORDER — FENTANYL CITRATE (PF) 100 MCG/2ML IJ SOLN
25.0000 ug | INTRAMUSCULAR | Status: DC | PRN
  Filled 2015-07-23: qty 1

## 2015-07-23 MED ORDER — DOCUSATE SODIUM 100 MG PO CAPS: 100.0000 mg | ORAL_CAPSULE | Freq: Two times a day (BID) | ORAL | Status: DC

## 2015-07-23 MED ORDER — PROMETHAZINE HCL 25 MG/ML IJ SOLN
6.2500 mg | INTRAMUSCULAR | Status: DC | PRN
  Filled 2015-07-23: qty 1

## 2015-07-23 MED ORDER — OXYCODONE-ACETAMINOPHEN 10-325 MG PO TABS: 1.0000 | ORAL_TABLET | ORAL | Status: DC | PRN

## 2015-07-23 MED ORDER — OXYCODONE HCL 5 MG PO TABS
5.0000 mg | ORAL_TABLET | Freq: Once | ORAL | Status: AC
  Administered 2015-07-23: 5 mg via ORAL
  Filled 2015-07-23: qty 1

## 2015-07-23 MED ORDER — LACTATED RINGERS IV SOLN
INTRAVENOUS | Status: DC
  Filled 2015-07-23: qty 1000

## 2015-07-23 MED ORDER — DEXAMETHASONE SODIUM PHOSPHATE 4 MG/ML IJ SOLN
INTRAMUSCULAR | Status: DC | PRN
  Administered 2015-07-23: 10 mg via INTRAVENOUS

## 2015-07-23 MED ORDER — OXYCODONE HCL 5 MG PO TABS
ORAL_TABLET | ORAL | Status: AC
  Filled 2015-07-23: qty 1

## 2015-07-23 MED ORDER — STERILE WATER FOR IRRIGATION IR SOLN
Status: DC | PRN
  Administered 2015-07-23: 1000 mL

## 2015-07-23 MED ORDER — MIDAZOLAM HCL 5 MG/5ML IJ SOLN
INTRAMUSCULAR | Status: DC | PRN
  Administered 2015-07-23 (×2): 1 mg via INTRAVENOUS

## 2015-07-23 MED ORDER — MEPERIDINE HCL 25 MG/ML IJ SOLN
6.2500 mg | INTRAMUSCULAR | Status: DC | PRN
  Filled 2015-07-23: qty 1

## 2015-07-23 MED ORDER — LIDOCAINE HCL 1 % IJ SOLN
INTRAMUSCULAR | Status: DC | PRN
  Administered 2015-07-23: 20 mL

## 2015-07-23 MED ORDER — MIDAZOLAM HCL 2 MG/2ML IJ SOLN
INTRAMUSCULAR | Status: AC
  Filled 2015-07-23: qty 2

## 2015-07-23 MED ORDER — SILVER SULFADIAZINE 1 % EX CREA: 1.0000 "application " | TOPICAL_CREAM | Freq: Four times a day (QID) | CUTANEOUS | Status: DC | PRN

## 2015-07-23 MED ORDER — PROPOFOL 10 MG/ML IV BOLUS
INTRAVENOUS | Status: DC | PRN
  Administered 2015-07-23: 200 mg via INTRAVENOUS

## 2015-07-23 MED ORDER — FENTANYL CITRATE (PF) 100 MCG/2ML IJ SOLN
INTRAMUSCULAR | Status: DC | PRN
  Administered 2015-07-23: 25 ug via INTRAVENOUS
  Administered 2015-07-23: 50 ug via INTRAVENOUS

## 2015-07-23 MED ORDER — SILVER SULFADIAZINE 1 % EX CREA
TOPICAL_CREAM | CUTANEOUS | Status: DC | PRN
  Administered 2015-07-23: 1 via TOPICAL

## 2015-07-23 MED ORDER — LIDOCAINE HCL (CARDIAC) 20 MG/ML IV SOLN
INTRAVENOUS | Status: DC | PRN
  Administered 2015-07-23: 60 mg via INTRAVENOUS

## 2015-07-23 SURGICAL SUPPLY — 48 items
APPLICATOR COTTON TIP 6IN STRL (MISCELLANEOUS) ×4 IMPLANT
BAG DRN ANRFLXCHMBR STRAP LEK (BAG)
BAG URINE DRAINAGE (UROLOGICAL SUPPLIES) IMPLANT
BAG URINE LEG 19OZ MD ST LTX (BAG) IMPLANT
BLADE SURG 15 STRL LF DISP TIS (BLADE) IMPLANT
BLADE SURG 15 STRL SS (BLADE) ×2
CANISTER SUCTION 1200CC (MISCELLANEOUS) ×1 IMPLANT
CATH ROBINSON RED A/P 16FR (CATHETERS) ×1 IMPLANT
CLOTH BEACON ORANGE TIMEOUT ST (SAFETY) ×2 IMPLANT
COVER BACK TABLE 60X90IN (DRAPES) ×2 IMPLANT
DEPRESSOR TONGUE BLADE STERILE (MISCELLANEOUS) ×3 IMPLANT
DRAPE LG THREE QUARTER DISP (DRAPES) ×1 IMPLANT
DRAPE UNDERBUTTOCKS STRL (DRAPE) ×2 IMPLANT
DRSG KUZMA FLUFF (GAUZE/BANDAGES/DRESSINGS) ×1 IMPLANT
DRSG TELFA 3X8 NADH (GAUZE/BANDAGES/DRESSINGS) ×2 IMPLANT
ELECT REM PT RETURN 9FT ADLT (ELECTROSURGICAL) ×2
ELECTRODE REM PT RTRN 9FT ADLT (ELECTROSURGICAL) ×1 IMPLANT
GAUZE SPONGE 4X4 16PLY XRAY LF (GAUZE/BANDAGES/DRESSINGS) ×1 IMPLANT
GLOVE BIO SURGEON STRL SZ 6 (GLOVE) ×2 IMPLANT
GLOVE BIO SURGEON STRL SZ 6.5 (GLOVE) ×1 IMPLANT
GLOVE BIOGEL PI IND STRL 6.5 (GLOVE) IMPLANT
GLOVE BIOGEL PI IND STRL 7.5 (GLOVE) IMPLANT
GLOVE BIOGEL PI INDICATOR 6.5 (GLOVE) ×1
GLOVE BIOGEL PI INDICATOR 7.5 (GLOVE) ×1
GOWN STRL REUS W/ TWL LRG LVL3 (GOWN DISPOSABLE) ×2 IMPLANT
GOWN STRL REUS W/TWL LRG LVL3 (GOWN DISPOSABLE) ×4
LEGGING LITHOTOMY PAIR STRL (DRAPES) ×1 IMPLANT
NDL HYPO 25X1 1.5 SAFETY (NEEDLE) IMPLANT
NEEDLE HYPO 25X1 1.5 SAFETY (NEEDLE) ×2 IMPLANT
PACK BASIN DAY SURGERY FS (CUSTOM PROCEDURE TRAY) ×2 IMPLANT
PAD DRESSING TELFA 3X8 NADH (GAUZE/BANDAGES/DRESSINGS) IMPLANT
PAD OB MATERNITY 4.3X12.25 (PERSONAL CARE ITEMS) ×2 IMPLANT
PAD PREP 24X48 CUFFED NSTRL (MISCELLANEOUS) ×2 IMPLANT
PENCIL BUTTON HOLSTER BLD 10FT (ELECTRODE) ×1 IMPLANT
SCOPETTES 8  STERILE (MISCELLANEOUS) ×2
SCOPETTES 8 STERILE (MISCELLANEOUS) ×2 IMPLANT
SUT VIC AB 2-0 SH 27 (SUTURE)
SUT VIC AB 2-0 SH 27XBRD (SUTURE) IMPLANT
SUT VIC AB 3-0 PS2 18 (SUTURE)
SUT VIC AB 3-0 PS2 18XBRD (SUTURE) IMPLANT
SYR CONTROL 10ML LL (SYRINGE) ×1 IMPLANT
TOWEL OR 17X24 6PK STRL BLUE (TOWEL DISPOSABLE) ×4 IMPLANT
TRAY DSU PREP LF (CUSTOM PROCEDURE TRAY) ×2 IMPLANT
TUBE CONNECTING 12X1/4 (SUCTIONS) ×2 IMPLANT
VACUUM HOSE 7/8X10 W/ WAND (MISCELLANEOUS) IMPLANT
VACUUM HOSE/TUBING 7/8INX6FT (MISCELLANEOUS) ×2 IMPLANT
WATER STERILE IRR 500ML POUR (IV SOLUTION) ×3 IMPLANT
YANKAUER SUCT BULB TIP NO VENT (SUCTIONS) ×2 IMPLANT

## 2015-07-23 NOTE — Discharge Instructions (Signed)
Vulvar Laser, Care After The vulva is the external female genitalia, outside and around the vagina and pubic bone. It consists of:  The skin on, and in front of, the pubic bone.  The clitoris.  The labia majora (large lips) on the outside of the vagina.  The labia minora (small lips) around the opening of the vagina.  The opening and the skin in and around the vagina. A vulvectomy is the removal of the tissue of the vulva, which sometimes includes removal of the lymph nodes and tissue in the groin areas. These discharge instructions provide you with general information on caring for yourself after you leave the hospital. It is also important that you know the warning signs of complications, so that you can seek treatment. Please read the instructions outlined below and refer to this sheet in the next few weeks. Your caregiver may also give you specific information and medicines. If you have any questions or complications after discharge, please call your caregiver. ACTIVITY  Rest as much as possible the first two weeks after discharge.  Arrange to have help from family or others with your daily activities when you go home.  Avoid heavy lifting (more than 5 pounds), pushing, or pulling.  If you feel tired, balance your activity with rest periods.  Follow your caregiver's instruction about climbing stairs and driving a car.  Increase activity gradually.  Do not exercise until you have permission from your caregiver. LEG AND FOOT CARE If your doctor has removed lymph nodes from your groin area, there may be an increase in swelling of your legs and feet. You can help prevent swelling by doing the following:  Elevate your legs while sitting or lying down.  If your caregiver has ordered special stockings, wear them according to instructions.  Avoid standing in one place for long periods of time.  Call the physical therapy department if you have any questions about swelling or  treatment for swelling.  Avoid salt in your diet. It can cause fluid retention and swelling.  Do not cross your legs, especially when sitting. NUTRITION  You may resume your normal diet.  Drink 6 to 8 glasses of fluids a day.  Eat a healthy, balanced diet including portions of food from the meat (protein), milk, fruit, vegetable, and bread groups.  Your caregiver may recommend you take a multivitamin with iron. ELIMINATION  You may notice that your stream of urine is at a different angle, and may tend to spray. Using a plastic funnel may help to decrease urine spray.  If constipation occurs, drink more liquids, and add more fruits, vegetables, and bran to your diet. You may take a mild laxative, such as Milk of Magnesia, Metamucil, or a stool softener such as Colace, with permission from your caregiver. HYGIENE  You may shower and wash your hair.  Check with your caregiver about tub baths.  Do not add any bath oils or chemicals to your bath water, after you have permission to take baths.  While passing urine, pour water from a bottle or spray over your vulva to dilute the urine as it passes the incision (this will decrease burning and discomfort).  Apply silvadine cream or zinc oxide diaper cream to burn site after each episode of using the toilet  Clean yourself well after moving your bowels.  After urinating, do not wipe. Dap or pat dry with toilet paper or a dry cleath soft cloth.  A sitz bath will help keep your perineal area  clean, reduce swelling, and provide comfort.  Avoid wearing underpants for the first 2 weeks and wear loose skirts to allow circulation of air around the incision  You do not need to apply dressings, salves or lotions to the wound.  The stitches are self-dissolving and will absorb and disappear over a couple of months (it is normal to notice the knot from the stitches on toilet paper after voiding). HOME CARE INSTRUCTIONS   Apply a soft ice pack  (or frozen bag of peas) to your perineum (vulva) every hour in the first 48 hours after surgery. This will reduce swelling.  Avoid activities that involve a lot of friction between your legs.  Avoid wearing pants or underpants in the 1st 2 weeks (skirts are preferable).  Take your temperature twice a day and record it, especially if you feel feverish or have chills.  Follow your caregiver's instructions about medicines, activity, and follow-up appointments after surgery.  Do not drink alcohol while taking pain medicine.  Change your dressing as advised by your caregiver.  You may take over-the-counter medicine for pain, recommended by your caregiver.  If your pain is not relieved with medicine, call your caregiver.  Do not take aspirin because it can cause bleeding.  Do not douche or use tampons (use a nonperfumed sanitary pad).  Do not have sexual intercourse until your caregiver gives you permission (typically 6 weeks postoperatively). Hugging, kissing, and playful sexual activity is fine with your caregiver's permission.  Warm sitz baths, with your caregiver's permission, are helpful to control swelling and discomfort.  Take showers instead of baths, until your caregiver gives you permission to take baths.  You may take a mild medicine for constipation, recommended by your caregiver. Bran foods and drinking a lot of fluids will help with constipation.  Make sure your family understands everything about your operation and recovery. SEEK MEDICAL CARE IF:   You notice swelling and redness around the wound area.  You notice a foul smell coming from the wound or on the surgical dressing.  You notice the wound is separating.  You have painful or bloody urination.  You develop nausea and vomiting.  You develop diarrhea.  You develop a rash.  You have a reaction or allergy from the medicine.  You feel dizzy or light-headed.  You need stronger pain medicine. SEEK  IMMEDIATE MEDICAL CARE IF:   You develop a temperature of 102 F (38.9 C) or higher.  You pass out.  You develop leg or chest pain.  You develop abdominal pain.  You develop shortness of breath.  You develop bleeding from the wound area.  You see pus in the wound area. MAKE SURE YOU:   Understand these instructions.  Will watch your condition.  Will get help right away if you are not doing well or get worse. Document Released: 06/09/2004 Document Revised: 03/12/2014 Document Reviewed: 09/27/2009 [facility] Patient Information 2015 [place], [place]. This information is not intended to replace advice given to you by your health care provider. Make sure you discuss any questions you have with your health care provider. Post Anesthesia Home Care Instructions  Activity: Get plenty of rest for the remainder of the day. A responsible adult should stay with you for 24 hours following the procedure.  For the next 24 hours, DO NOT: -Drive a car -Advertising copywriter -Drink alcoholic beverages -Take any medication unless instructed by your physician -Make any legal decisions or sign important papers.  Meals: Start with liquid foods such as  gelatin or soup. Progress to regular foods as tolerated. Avoid greasy, spicy, heavy foods. If nausea and/or vomiting occur, drink only clear liquids until the nausea and/or vomiting subsides. Call your physician if vomiting continues.  Special Instructions/Symptoms: Your throat may feel dry or sore from the anesthesia or the breathing tube placed in your throat during surgery. If this causes discomfort, gargle with warm salt water. The discomfort should disappear within 24 hours.  If you had a scopolamine patch placed behind your ear for the management of post- operative nausea and/or vomiting:  1. The medication in the patch is effective for 72 hours, after which it should be removed.  Wrap patch in a tissue and discard in the trash. Wash hands  thoroughly with soap and water. 2. You may remove the patch earlier than 72 hours if you experience unpleasant side effects which may include dry mouth, dizziness or visual disturbances. 3. Avoid touching the patch. Wash your hands with soap and water after contact with the patch.

## 2015-07-23 NOTE — Op Note (Addendum)
OPERATIVE NOTE 07/23/15  PATIENT: Cathy Smith DATE: 07/23/15   Preop Diagnosis: VIN3  Postoperative Diagnosis: same  Surgery:  CO2 laser of the entire vulva  Surgeons:  Quinn Axe, MD Assistant: none  Anesthesia: General   Estimated blood loss: <5 ml  IVF:  100 ml   Urine output:50 ml   Complications: None   Pathology: none  Operative findings: extensive acetowhite changes to entire vulva (labia majora and minora bilaterally and across perineal body.  Procedure: The patient was identified in the preoperative holding area. Informed consent was signed on the chart. Patient was seen history was reviewed and exam was performed.   The patient was then taken to the operating room and placed in the supine position with SCD hose on. General anesthesia was then induced without difficulty. She was then placed in the dorsolithotomy position. The perineum was prepped with Betadine. The vagina was prepped with Betadine. The patient was then draped after the prep was dried. A red rubber catheter was inserted to drain the bladder under sterile conditions.  Timeout was performed the patient, procedure, antibiotic, allergy, and length of procedure. 5% acetic acid solution was applied to the perineum. The vulvar tissues were inspected for areas of acetowhite changes or leukoplakia. The lesion was identified and the marking pen was used to circumscribe the area with appropriate surgical margins. The subcuticular tissues were infiltrated with 1% lidocaine. The CO2 laser was set to 12 watts continuous.  The patietn and OR staff were assured to be fitted with laser appropriate mask and eyewear. The laser was directed at a tongue depressor to ensure correct alignment. The patient had been draped in wet towels around the sterile field. Circumferential laser to the subeipithelial dermis took place on the labia minora and majora and perineal body. Eschar was removed with a wet  cloth.  Silvadine was applied.  All instrument, suture, laparotomy, Ray-Tec, and needle counts were correct x2. The patient tolerated the procedure well and was taken recovery room in stable condition. This is Cathy Smith dictating an operative note on Cathy Smith.  Quinn Axe, MD

## 2015-07-23 NOTE — Transfer of Care (Signed)
Immediate Anesthesia Transfer of Care Note  Patient: Cathy Smith  Procedure(s) Performed: Procedure(s) (LRB): BILATERAL CO2 LASER ABLATION OF THE VULVA (Bilateral)  Patient Location: PACU  Anesthesia Type: General  Level of Consciousness: awake, oriented, sedated and patient cooperative  Airway & Oxygen Therapy: Patient Spontanous Breathing and Patient connected to face mask oxygen  Post-op Assessment: Report given to PACU RN and Post -op Vital signs reviewed and stable  Post vital signs: Reviewed and stable  Complications: No apparent anesthesia complications

## 2015-07-23 NOTE — H&P (View-Only) (Signed)
Followup Note: Gyn-Onc   Cathy Smith 49 y.o. female  Chief Complaint  Patient presents with  . 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  Plan:   Needs vulvar CO2 laser of bilateral posterior labia majora and introitus. Will schedule for October.  Interval History Patient returns today for preoperative evaluation. She has no complaints.  Her last Pap smear in February 2016 showed ASCUS + hrHPV.   HPI:49 year old [place] American female seen in consultation requested of [name], and [name] regarding management of a newly diagnosed severe dysplasia of the upper vagina. Briefly, the patient's history includes having a past history of abdominal hysterectomy for what we understand and have been fibroids. She denies any problems with Pap smears prior to the hysterectomy. More recently she has had some abnormal Pap smears ultimately resulting in colposcopy and directed biopsy on 04/07/2012. 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  The patient did well until March 2015 with a Pap smear showing high-grade dysplasia once again. In April and May 2015 the patient received 2 additional months of Efudex. From June 2015 through February 2016 her Pap smears only showed low-grade SIL.  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 She was scheduled for a wide local excision of the right anterior vulva which was performed on 05/06/15 and revealed VIN3. Margins were positive. Intraoperative evaluation showed lesions consistent with VIN3 on bilateral posterior labia. These were biopsied (but not treated at the patient's request). These biopsies also returned as positive  for VIN3.   Review of Systems:10 point review of systems is negative as noted above.   Vitals: Blood pressure 120/72, pulse 78, temperature 98.2 F (36.8 C), temperature source Oral, resp. rate 18, height 5\' 1"  (1.549 m), weight 149 lb 11.2 oz (67.903 kg), SpO2 99 %.  Physical Exam: General : The patient is a healthy woman in no acute distress.  HEENT: normocephalic, extraoccular movements normal; neck is supple without thyromegally  Lynphnodes: Supraclavicular and inguinal nodes not enlarged  Abdomen: Soft, non-tender, no ascites, no organomegally, no masses, no hernias  Pelvic:  EGBUS: Right anterior labia majora healed. Bilateral posterior dysplastic changes in horse shoe configuration. Vagina: Normal, no lesions  Urethra and Bladder: Normal, non-tender  Cervix: Surgically absent  Uterus: Surgically absent  Bi-manual examination: Non-tender; no adenxal masses or nodularity  Rectal: normal sphincter tone, no masses, no blood  Lower extremities: No edema or varicosities. Normal range of motion      No Known Allergies  Past Medical History  Diagnosis Date  . [facility]  . Hypertension   . Hypertriglyceridemia without hypercholesterolemia   . History of condyloma acuminatum   . History of uterine fibroid   . Vulvar dysplasia   . History of vaginal dysplasia     VAIN III  . Wears glasses     Past Surgical History  Procedure Laterality Date  . Anterior cervical decomp/discectomy fusion  11-30-2009    C4 -- C6  . I & d left buttock abscess  04-28-2001  . Colonoscopy  01-28-2015  . Abdominal hysterectomy  1995 approx  . Vulvectomy Right 05/06/2015    Procedure: WIDE LOCAL  EXCISION  OF RIGHT VULVA;  Surgeon: Adolphus Birchwood, MD;  Location: [facility];  Service: Gynecology;  Laterality: Right;    Current Outpatient Prescriptions  Medication Sig Dispense Refill  . emtricitabine-tenofovir (TRUVADA) 200-300  MG per tablet TAKE 1 TABLET BY MOUTH DAILY 30 tablet 3  . fenofibrate (TRICOR) 48 MG tablet TAKE 1 TABLET BY MOUTH DAILY 30 tablet 0  . hydrochlorothiazide (HYDRODIURIL) 25 MG tablet Take 25 mg by mouth daily.     Marland Kitchen oxyCODONE-acetaminophen (PERCOCET/ROXICET) 5-325 MG per tablet Take 1 tablet by mouth every 4 (four) hours as needed for severe pain. 30 tablet 0  . PREZISTA 800 MG tablet TAKE ONE TABLET BY MOUTH EVERY DAY 30 tablet 6  . raltegravir (ISENTRESS) 400 MG tablet TAKE ONE TABLET BY MOUTH TWICE DAILY 60 tablet 3  . ritonavir (NORVIR) 100 MG TABS tablet TAKE 1 TABLET BY MOUTH EVERY DAY WITH BREAKFAST 30 tablet 3  . spironolactone (ALDACTONE) 25 MG tablet Take 1 tablet (25 mg total) by mouth daily. 30 tablet 6  . docusate sodium (COLACE) 100 MG capsule Take 1 capsule (100 mg total) by mouth 2 (two) times daily. (Patient not taking: Reported on 05/20/2015) 30 capsule 0   No current facility-administered medications for this visit.    Social History   Social History  . Marital Status: Married    Spouse Name: N/A  . Number of Children: N/A  . Years of Education: N/A   Occupational History  . Not on file.   Social History Main Topics  . Smoking status: Never Smoker   . Smokeless tobacco: Never Used  . Alcohol Use: No  . Drug Use: No  . Sexual Activity:    Partners: Male    Birth Control/ Protection: Condom     Comment: pt. given condoms   Other Topics Concern  . Not on file   Social History Narrative    Family History  Problem Relation Age of Onset  . Cataracts Mother   . Hypertension Father   . Colon cancer Neg Hx       Quinn Axe, MD 07/01/2015, 12:01 PM

## 2015-07-23 NOTE — Anesthesia Postprocedure Evaluation (Signed)
  Anesthesia Post-op Note  Patient: Cathy Smith  Procedure(s) Performed: Procedure(s) (LRB): BILATERAL CO2 LASER ABLATION OF THE VULVA (Bilateral)  Patient Location: PACU  Anesthesia Type: General  Level of Consciousness: awake and alert   Airway and Oxygen Therapy: Patient Spontanous Breathing  Post-op Pain: mild  Post-op Assessment: Post-op Vital signs reviewed, Patient's Cardiovascular Status Stable, Respiratory Function Stable, Patent Airway and No signs of Nausea or vomiting  Last Vitals:  Filed Vitals:   07/23/15 1145  BP: 138/83  Pulse: 74  Temp:   Resp: 16    Post-op Vital Signs: stable   Complications: No apparent anesthesia complications

## 2015-07-23 NOTE — Anesthesia Procedure Notes (Signed)
Procedure Name: LMA Insertion Date/Time: 07/23/2015 9:57 AM Performed by: Renella Cunas D Pre-anesthesia Checklist: Patient identified, Emergency Drugs available, Suction available and Patient being monitored Patient Re-evaluated:Patient Re-evaluated prior to inductionOxygen Delivery Method: Circle System Utilized Preoxygenation: Pre-oxygenation with 100% oxygen Intubation Type: IV induction Ventilation: Mask ventilation without difficulty LMA: LMA inserted LMA Size: 4.0 Number of attempts: 1 Airway Equipment and Method: Bite block Placement Confirmation: positive ETCO2 Tube secured with: Tape Dental Injury: Teeth and Oropharynx as per pre-operative assessment

## 2015-07-23 NOTE — Anesthesia Preprocedure Evaluation (Addendum)
Anesthesia Evaluation  Patient identified by MRN, date of birth, ID band Patient awake    Reviewed: Allergy & Precautions, NPO status , Patient's Chart, lab work & pertinent test results  Airway Mallampati: II  TM Distance: >3 FB Neck ROM: Full    Dental no notable dental hx.    Pulmonary neg pulmonary ROS,    Pulmonary exam normal breath sounds clear to auscultation       Cardiovascular Exercise Tolerance: Good hypertension, Pt. on medications Normal cardiovascular exam Rhythm:Regular Rate:Normal     Neuro/Psych negative neurological ROS  negative psych ROS   GI/Hepatic negative GI ROS, Neg liver ROS,   Endo/Other  negative endocrine ROS  Renal/GU negative Renal ROS  negative genitourinary   Musculoskeletal negative musculoskeletal ROS (+)   Abdominal   Peds negative pediatric ROS (+)  Hematology  (+) HIV,   Anesthesia Other Findings HIV positive  Reproductive/Obstetrics negative OB ROS                            Anesthesia Physical  Anesthesia Plan  ASA: III  Anesthesia Plan: General   Post-op Pain Management:    Induction: Intravenous  Airway Management Planned: LMA  Additional Equipment:   Intra-op Plan:   Post-operative Plan: Extubation in OR  Informed Consent: I have reviewed the patients History and Physical, chart, labs and discussed the procedure including the risks, benefits and alternatives for the proposed anesthesia with the patient or authorized representative who has indicated his/her understanding and acceptance.   Dental advisory given  Plan Discussed with: CRNA  Anesthesia Plan Comments:         Anesthesia Quick Evaluation

## 2015-07-23 NOTE — Interval H&P Note (Signed)
History and Physical Interval Note:  07/23/2015 9:33 AM  Cathy Smith  has presented today for surgery, with the diagnosis of VAGINAL INTRAPITHELIAL NEOPLASIA, HIV INFECTION  The various methods of treatment have been discussed with the patient and family. After consideration of risks, benefits and other options for treatment, the patient has consented to  Procedure(s): BILATERAL CO2 LASER ABLATION OF THE VULVA (Bilateral) as a surgical intervention .  The patient's history has been reviewed, patient examined, no change in status, stable for surgery.  I have reviewed the patient's chart and labs.  Questions were answered to the patient's satisfaction.     Quinn Axe

## 2015-07-24 ENCOUNTER — Telehealth: Payer: Self-pay | Admitting: Nurse Practitioner

## 2015-07-24 ENCOUNTER — Encounter (HOSPITAL_BASED_OUTPATIENT_CLINIC_OR_DEPARTMENT_OTHER): Payer: Self-pay | Admitting: Gynecologic Oncology

## 2015-07-24 ENCOUNTER — Telehealth: Payer: Self-pay | Admitting: Gynecologic Oncology

## 2015-07-24 DIAGNOSIS — R112 Nausea with vomiting, unspecified: Secondary | ICD-10-CM

## 2015-07-24 DIAGNOSIS — Z9889 Other specified postprocedural states: Principal | ICD-10-CM

## 2015-07-24 MED ORDER — ONDANSETRON HCL 4 MG PO TABS: 4.0000 mg | ORAL_TABLET | Freq: Three times a day (TID) | ORAL | Status: DC | PRN

## 2015-07-24 NOTE — Telephone Encounter (Signed)
Returned call to patient about complaints of post-op nausea and vomiting.  Stating she threw up several times yesterday.  She woke up this am and threw up again.  Having bowel movements, passing flatus, and voiding without difficulty.  Advised to eat bland foods and liquids as tolerated.  Zofran will be sent in for the patient.  She is to call if the symptoms worsen or persist.

## 2015-07-24 NOTE — Telephone Encounter (Signed)
Patient calling to report emesis x3 since surgery yesterday 07/23/15. Rn encouraged ginger ale or sprite with crackers, toast, or soup. Patient states she will try. She has not tried medication at this time. Informed Warner Mccreedy, NP, to advise.

## 2015-07-25 ENCOUNTER — Other Ambulatory Visit: Payer: Self-pay | Admitting: Family Medicine

## 2015-08-05 ENCOUNTER — Encounter: Payer: Self-pay | Admitting: Gynecologic Oncology

## 2015-08-05 ENCOUNTER — Ambulatory Visit: Payer: Commercial Managed Care - HMO | Attending: Gynecologic Oncology | Admitting: Gynecologic Oncology

## 2015-08-05 VITALS — BP 118/77 | HR 89 | Temp 98.9°F | Resp 18 | Ht 61.0 in | Wt 148.6 lb

## 2015-08-05 DIAGNOSIS — D071 Carcinoma in situ of vulva: Secondary | ICD-10-CM | POA: Diagnosis not present

## 2015-08-05 MED ORDER — ESTROGENS, CONJUGATED 0.625 MG/GM VA CREA: 1.0000 | TOPICAL_CREAM | VAGINAL | Status: DC

## 2015-08-05 MED ORDER — ESTROGENS, CONJUGATED 0.625 MG/GM VA CREA: TOPICAL_CREAM | VAGINAL | Status: DC

## 2015-08-05 NOTE — Addendum Note (Signed)
Addended by: Liliane Bade on: 08/05/2015 03:14 PM   Modules accepted: Orders

## 2015-08-05 NOTE — Progress Notes (Signed)
Followup Note: Gyn-Onc   Cathy Smith 49 y.o. female  Chief Complaint  Patient presents with  . 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 S/p extensive CO2 laser of vulva (circumferential, clitoral sparing) on 07/23/15. Healing well, with some labial agglutination.  Plan:   Premarin cream to vulvovaginal tissues three times weekly to prevent agglutination. Followup with me in 1 month to assess healing.  Interval History Patient returns today for postoperative evaluation. She has no complaints.  Her last Pap smear in February 2016 showed ASCUS + hrHPV.   HPI: 49 year old African American female seen in consultation requested of [name], and [name] regarding management of a newly diagnosed severe dysplasia of the upper vagina. Briefly, the patient's history includes having a past history of abdominal hysterectomy for what we understand and have been fibroids. She denies any problems with Pap smears prior to the hysterectomy. More recently she has had some abnormal Pap smears ultimately resulting in colposcopy and directed biopsy on 04/07/2012. 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  The patient did well until March 2015 with a Pap smear showing high-grade dysplasia once again. In April and May 2015 the patient received 2 additional months of Efudex. From June 2015 through February 2016 her Pap smears only showed low-grade SIL.  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 She was scheduled for a wide local excision of the right anterior vulva which was performed on 05/06/15 and revealed VIN3. Margins were positive. Intraoperative evaluation showed lesions consistent with  VIN3 on bilateral posterior labia. These were biopsied (but not treated at the patient's request). These biopsies also returned as positive for VIN3.  On 07/23/15 she was taken to the OR for an extensive CO2 laser ablation of the circumferential labia minora and majora to ablate all acetowhite areas. No pathology was taken. The clitoris was spared.   Interval Hx: Since surgery she has had no complaints. She reports itching sensation and burning on labia with urination.   Review of Systems:10 point review of systems is negative as noted above.   Vitals: Blood pressure 118/77, pulse 89, temperature 98.9 F (37.2 C), temperature source Oral, resp. rate 18, height  (1.549 m), weight 148 lb 9.6 oz (67.405 kg), SpO2 99 %.  Physical Exam: General : The patient is a healthy woman in no acute distress.  HEENT: normocephalic, extraoccular movements normal; neck is supple without thyromegally  Lynphnodes: Supraclavicular and inguinal nodes not enlarged  Abdomen: Soft, non-tender, no ascites, no organomegally, no masses, no hernias  Pelvic:  EGBUS: laser ulceration on bilateral labia majora and perineal body. Agglutination of perineal tissues and posterior labia minora. Normal healing response. Vagina: Normal, no lesions  Urethra and Bladder: Normal, non-tender  Cervix: Surgically absent  Uterus: Surgically absent  Bi-manual examination: Non-tender; no adenxal masses or nodularity  Rectal: normal sphincter tone, no masses, no blood  Lower extremities: No edema or varicosities. Normal range of motion      No Known Allergies  Past Medical History  Diagnosis Date  . [facility]  . Hypertension   . Hypertriglyceridemia without hypercholesterolemia   . History of condyloma acuminatum   .  History of uterine fibroid   . Vulvar dysplasia   . History of vaginal dysplasia     VAIN III  . Wears glasses     Past Surgical History   Procedure Laterality Date  . Anterior cervical decomp/discectomy fusion  11-30-2009    C4 -- C6  . I & d left buttock abscess  04-28-2001  . Colonoscopy  01-28-2015  . Abdominal hysterectomy  1995 approx  . Vulvectomy Right 05/06/2015    Procedure: WIDE LOCAL  EXCISION  OF RIGHT VULVA;  Surgeon: Adolphus Birchwood, MD;  Location: [facility] [place];  Service: Gynecology;  Laterality: Right;  . Co2 laser application Bilateral 07/23/2015    Procedure: BILATERAL CO2 LASER ABLATION OF THE VULVA;  Surgeon: Adolphus Birchwood, MD;  Location: [facility] [place];  Service: Gynecology;  Laterality: Bilateral;    Current Outpatient Prescriptions  Medication Sig Dispense Refill  . emtricitabine-tenofovir (TRUVADA) 200-300 MG per tablet TAKE 1 TABLET BY MOUTH DAILY 30 tablet 3  . ondansetron (ZOFRAN) 4 MG tablet Take 1-2 tablets (4-8 mg total) by mouth every 8 (eight) hours as needed for nausea or vomiting. 10 tablet 0  . oxyCODONE-acetaminophen (PERCOCET) 10-325 MG per tablet Take 1 tablet by mouth every 4 (four) hours as needed for pain. 50 tablet 0  . pravastatin (PRAVACHOL) 40 MG tablet Take 1 tablet (40 mg total) by mouth daily. 90 tablet 3  . PREZISTA 800 MG tablet TAKE ONE TABLET BY MOUTH EVERY DAY 30 tablet 6  . raltegravir (ISENTRESS) 400 MG tablet TAKE ONE TABLET BY MOUTH TWICE DAILY 60 tablet 3  . ritonavir (NORVIR) 100 MG TABS tablet TAKE 1 TABLET BY MOUTH EVERY DAY WITH BREAKFAST 30 tablet 3  . SILVADENE 1 % cream Apply 1 application topically 4 (four) times daily as needed. 50 g 0  . spironolactone (ALDACTONE) 25 MG tablet TAKE ONE TABLET BY MOUTH EVERY DAY 30 tablet 0   No current facility-administered medications for this visit.    Social History   Social History  . Marital Status: Married    Spouse Name: N/A  . Number of Children: N/A  . Years of Education: N/A   Occupational History  . Not on file.   Social History Main Topics  . Smoking status: Never Smoker   .  Smokeless tobacco: Never Used  . Alcohol Use: No  . Drug Use: No  . Sexual Activity:    Partners: Male    Birth Control/ Protection: Condom     Comment: pt. given condoms   Other Topics Concern  . Not on file   Social History Narrative    Family History  Problem Relation Age of Onset  . Cataracts Mother   . Hypertension Father   . Colon cancer Neg Hx       Quinn Axe, MD 08/05/2015, 1:06 PM

## 2015-08-12 ENCOUNTER — Other Ambulatory Visit: Payer: Self-pay | Admitting: *Deleted

## 2015-08-12 ENCOUNTER — Other Ambulatory Visit: Payer: Commercial Managed Care - HMO

## 2015-08-12 DIAGNOSIS — B20 Human immunodeficiency virus [HIV] disease: Secondary | ICD-10-CM

## 2015-08-12 LAB — CBC WITH DIFFERENTIAL/PLATELET
Basophils Absolute: 0 10*3/uL (ref 0.0–0.1)
Basophils Relative: 1 % (ref 0–1)
EOS ABS: 0.1 10*3/uL (ref 0.0–0.7)
EOS PCT: 2 % (ref 0–5)
HEMATOCRIT: 34.9 % — AB (ref 36.0–46.0)
Hemoglobin: 11.7 g/dL — ABNORMAL LOW (ref 12.0–15.0)
LYMPHS ABS: 1.3 10*3/uL (ref 0.7–4.0)
LYMPHS PCT: 44 % (ref 12–46)
MCH: 31.5 pg (ref 26.0–34.0)
MCHC: 33.5 g/dL (ref 30.0–36.0)
MCV: 94.1 fL (ref 78.0–100.0)
MONOS PCT: 12 % (ref 3–12)
MPV: 9.8 fL (ref 8.6–12.4)
Monocytes Absolute: 0.3 10*3/uL (ref 0.1–1.0)
Neutro Abs: 1.2 10*3/uL — ABNORMAL LOW (ref 1.7–7.7)
Neutrophils Relative %: 41 % — ABNORMAL LOW (ref 43–77)
PLATELETS: 223 10*3/uL (ref 150–400)
RBC: 3.71 MIL/uL — ABNORMAL LOW (ref 3.87–5.11)
RDW: 12.6 % (ref 11.5–15.5)
WBC: 2.9 10*3/uL — ABNORMAL LOW (ref 4.0–10.5)

## 2015-08-12 LAB — COMPLETE METABOLIC PANEL WITH GFR
ALT: 22 U/L (ref 6–29)
AST: 19 U/L (ref 10–35)
Albumin: 4.2 g/dL (ref 3.6–5.1)
Alkaline Phosphatase: 68 U/L (ref 33–130)
BILIRUBIN TOTAL: 0.3 mg/dL (ref 0.2–1.2)
BUN: 8 mg/dL (ref 7–25)
CALCIUM: 9.2 mg/dL (ref 8.6–10.4)
CO2: 25 mmol/L (ref 20–31)
CREATININE: 0.91 mg/dL (ref 0.50–1.05)
Chloride: 104 mmol/L (ref 98–110)
GFR, EST AFRICAN AMERICAN: 84 mL/min (ref >=60)
GFR, EST NON AFRICAN AMERICAN: 73 mL/min (ref >=60)
Glucose, Bld: 101 mg/dL — ABNORMAL HIGH (ref 65–99)
Potassium: 4.1 mmol/L (ref 3.5–5.3)
Sodium: 135 mmol/L (ref 135–146)
TOTAL PROTEIN: 7.5 g/dL (ref 6.1–8.1)

## 2015-08-13 LAB — T-HELPER CELL (CD4) - (RCID CLINIC ONLY)
CD4 % Helper T Cell: 18 % — ABNORMAL LOW (ref 33–55)
CD4 T CELL ABS: 210 /uL — AB (ref 400–2700)

## 2015-08-13 LAB — HIV-1 RNA QUANT-NO REFLEX-BLD
HIV 1 RNA Quant: <20 copies/mL (ref <20)
HIV-1 RNA Quant, Log: <1.3 {Log} (ref <1.30)

## 2015-08-27 ENCOUNTER — Ambulatory Visit (INDEPENDENT_AMBULATORY_CARE_PROVIDER_SITE_OTHER): Payer: Commercial Managed Care - HMO | Admitting: Infectious Diseases

## 2015-08-27 ENCOUNTER — Other Ambulatory Visit: Payer: Self-pay | Admitting: *Deleted

## 2015-08-27 ENCOUNTER — Encounter: Payer: Self-pay | Admitting: Infectious Diseases

## 2015-08-27 VITALS — BP 110/76 | HR 88 | Temp 98.3°F | Ht 61.0 in | Wt 148.8 lb

## 2015-08-27 DIAGNOSIS — Z981 Arthrodesis status: Secondary | ICD-10-CM | POA: Diagnosis not present

## 2015-08-27 DIAGNOSIS — G959 Disease of spinal cord, unspecified: Secondary | ICD-10-CM

## 2015-08-27 DIAGNOSIS — B20 Human immunodeficiency virus [HIV] disease: Secondary | ICD-10-CM | POA: Diagnosis not present

## 2015-08-27 DIAGNOSIS — Z23 Encounter for immunization: Secondary | ICD-10-CM

## 2015-08-27 DIAGNOSIS — D071 Carcinoma in situ of vulva: Secondary | ICD-10-CM

## 2015-08-27 DIAGNOSIS — Z79899 Other long term (current) drug therapy: Secondary | ICD-10-CM

## 2015-08-27 DIAGNOSIS — Z113 Encounter for screening for infections with a predominantly sexual mode of transmission: Secondary | ICD-10-CM

## 2015-08-27 MED ORDER — DARUNAVIR ETHANOLATE 800 MG PO TABS: 800.0000 mg | ORAL_TABLET | Freq: Every day | ORAL | Status: DC

## 2015-08-27 MED ORDER — ELVITEG-COBIC-EMTRICIT-TENOFAF 150-150-200-10 MG PO TABS: 1.0000 | ORAL_TABLET | Freq: Every day | ORAL | Status: DC

## 2015-08-27 MED ORDER — SILVER SULFADIAZINE 1 % EX CREA: 1.0000 "application " | TOPICAL_CREAM | Freq: Two times a day (BID) | CUTANEOUS | Status: DC | PRN

## 2015-08-27 NOTE — Telephone Encounter (Signed)
Received phone call from patient requesting refill on silvadene cream. She states she is still using it as needed. Refill sent to patient's pharmacy per Warner MccreedyMelissa Cross,  NP. No other questions or concerns noted at this time.

## 2015-08-27 NOTE — Assessment & Plan Note (Signed)
She is feeling much better, denies pain.

## 2015-08-27 NOTE — Assessment & Plan Note (Signed)
Will change her to genvoya/drv to simplify her tx.  Given condoms.  Given flu shot.  Will see her back in 3-4 months with labs due to med change.

## 2015-08-27 NOTE — Assessment & Plan Note (Signed)
Doing much better, denies pain.

## 2015-08-27 NOTE — Assessment & Plan Note (Signed)
Will get her back in with GYN °

## 2015-08-27 NOTE — Progress Notes (Signed)
   Subjective:    Patient ID: Cathy Smith, female    DOB: 03/20/1966, 49 y.o.   MRN: [id number]  HPI 49 yo F with HIV+ with hx of drug resistance mutations.  She also has a hx of VIN III/CIS (has been getting laser tx), anterior cervical decompression (C4-6) with prosthesis and allograft.  Husband is HIV+ and follows with Dr Orvan Falconerampbell.  [facility]   She has been maintained on TRV/DRVc/ISN. Had some mild nausea this AM.    HIV 1 RNA QUANT (copies/mL)  Date Value  08/12/2015 <20  02/11/2015 <20  07/25/2014 <20   CD4 T CELL ABS (/uL)  Date Value  08/12/2015 210*  02/11/2015 190*  07/25/2014 200*     Review of Systems  Constitutional: Negative for appetite change and unexpected weight change.  HENT: Negative for rhinorrhea.   Respiratory: Negative for cough and shortness of breath.   Gastrointestinal: Positive for nausea. Negative for diarrhea and constipation.  Genitourinary: Negative for difficulty urinating.  no menses. Still having some pain from her previous laser surgery site.  mammo sched for Jan 2017 Had colon March 2016.      Objective:   Physical Exam  Constitutional: She appears well-developed and well-nourished.  HENT:  Mouth/Throat: No oropharyngeal exudate.  Eyes: EOM are normal. Pupils are equal, round, and reactive to light.  Neck: Neck supple.  Cardiovascular: Normal rate, regular rhythm and normal heart sounds.   Pulmonary/Chest: Effort normal and breath sounds normal.  Abdominal: Soft. Bowel sounds are normal. There is no tenderness. There is no rebound.  Lymphadenopathy:    She has no cervical adenopathy.      Assessment & Plan:

## 2015-08-29 ENCOUNTER — Encounter: Payer: Self-pay | Admitting: Gynecologic Oncology

## 2015-09-05 ENCOUNTER — Ambulatory Visit: Payer: Commercial Managed Care - HMO | Attending: Gynecologic Oncology | Admitting: Gynecologic Oncology

## 2015-09-05 ENCOUNTER — Encounter: Payer: Self-pay | Admitting: Gynecologic Oncology

## 2015-09-05 ENCOUNTER — Telehealth: Payer: Self-pay | Admitting: *Deleted

## 2015-09-05 VITALS — BP 126/94 | HR 104 | Temp 99.0°F | Resp 16 | Ht 61.0 in | Wt 149.6 lb

## 2015-09-05 DIAGNOSIS — N891 Moderate vaginal dysplasia: Secondary | ICD-10-CM | POA: Diagnosis not present

## 2015-09-05 DIAGNOSIS — D071 Carcinoma in situ of vulva: Secondary | ICD-10-CM | POA: Insufficient documentation

## 2015-09-05 NOTE — Patient Instructions (Signed)
Follow up with Dr Adolphus BirchwoodEmma Rossi at the end of January 06, 2016 , call with any changes , questions or concerns.   Thank you

## 2015-09-05 NOTE — Telephone Encounter (Signed)
Patient's Gyn office called stating she was there for a visit and did not know that her regimen had changed. [facility] and when they checked with the pharmacy they were told it needed prior authorization. A PA was never sent to this office until today. Will initiate, and patient was instructed to continue with previous regimen until we get approval for Genvoya.

## 2015-09-05 NOTE — Progress Notes (Signed)
Followup Note: Gyn-Onc   Cathy Smith 49 y.o. female  Chief Complaint  Patient presents with  . 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 S/p extensive CO2 laser of vulva (circumferential, clitoral sparing) on 07/23/15. Healing well, with small ulceration at posterior introitus. 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  Plan:   Followup in February/March 2017 for vulvar surveillance and pap testing with hpv co-testing.  Interval History Patient returns today for vulvar evaluation. She has no complaints. She has been using premarin and silvadine.  Her last Pap smear in February 2016 showed ASCUS + hrHPV.   HPI: 49 year old African American female seen in consultation requested of [name], and [name] regarding management of a newly diagnosed severe dysplasia of the upper vagina. Briefly, the patient's history includes having a past history of abdominal hysterectomy for what we understand and have been fibroids. She denies any problems with Pap smears prior to the hysterectomy. More recently she has had some abnormal Pap smears ultimately resulting in colposcopy and directed biopsy on 04/07/2012. 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  The patient did well until March 2015 with a Pap smear showing high-grade dysplasia once again. In April and May 2015 the patient received 2 additional months of Efudex. From June 2015 through February 2016 her Pap smears only showed low-grade SIL.  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 She was scheduled for a wide local excision of the right anterior vulva which was performed on 05/06/15 and revealed VIN3. Margins were  positive. Intraoperative evaluation showed lesions consistent with VIN3 on bilateral posterior labia. These were biopsied (but not treated at the patient's request). These biopsies also returned as positive for VIN3.  On 07/23/15 she was taken to the OR for an extensive CO2 laser ablation of the circumferential labia minora and majora to ablate all acetowhite areas. No pathology was taken. The clitoris was spared.    Review of Systems:10 point review of systems is negative as noted above.   Vitals: Blood pressure 126/94, pulse 104, temperature 99 F (37.2 C), temperature source Oral, resp. rate 16, height  (1.549 m), weight 149 lb 9.6 oz (67.858 kg).  Physical Exam: General : The patient is a healthy woman in no acute distress.  HEENT: normocephalic, extraoccular movements normal; neck is supple without thyromegally  Lynphnodes: Supraclavicular and inguinal nodes not enlarged  Abdomen: Soft, non-tender, no ascites, no organomegally, no masses, no hernias  Pelvic:  EGBUS: well healed labia majora and minora. Small area of ulceration (1cm) at midline perineal body consistent with healing response. Leukoplakia noted on anterior bilateral labia minora consistent with VIN. Resolution of agglutination of perineal tissues and posterior labia minora.  Vagina: Normal, no lesions  Urethra and Bladder: Normal, non-tender  Cervix: Surgically absent  Uterus: Surgically absent  Bi-manual examination: Non-tender; no adenxal masses or nodularity  Rectal: normal sphincter tone, no masses, no blood  Lower extremities: No edema or varicosities. Normal range of motion      No Known Allergies  Past Medical History  Diagnosis Date  . [facility] infection ([facility]) [facility]  . Hypertension   . Hypertriglyceridemia without hypercholesterolemia   .  History of condyloma acuminatum   . History of uterine fibroid   . Vulvar dysplasia   . History of vaginal dysplasia      VAIN III  . Wears glasses     Past Surgical History  Procedure Laterality Date  . Anterior cervical decomp/discectomy fusion  11-30-2009    C4 -- C6  . I & d left buttock abscess  04-28-2001  . Colonoscopy  01-28-2015  . Abdominal hysterectomy  1995 approx  . Vulvectomy Right 05/06/2015    Procedure: WIDE LOCAL  EXCISION  OF RIGHT VULVA;  Surgeon: Adolphus BirchwoodEmma Anwar Sakata, MD;  [facility] [place];  Service: Gynecology;  Laterality: Right;  . Co2 laser application Bilateral 07/23/2015    Procedure: BILATERAL CO2 LASER ABLATION OF THE VULVA;  Surgeon: Adolphus BirchwoodEmma Fryda Molenda, MD;  [facility] [place];  Service: Gynecology;  Laterality: Bilateral;    Current Outpatient Prescriptions  Medication Sig Dispense Refill  . conjugated estrogens (PREMARIN) vaginal cream Apply to vaginal tissues at night before bed on Monday, Wednesday, Friday. Do not wipe off. 42.5 g 12  . darunavir (PREZISTA) 800 MG tablet Take 1 tablet (800 mg total) by mouth daily. 90 tablet 3  . ISENTRESS 400 MG tablet     . NORVIR 100 MG TABS tablet     . pravastatin (PRAVACHOL) 40 MG tablet Take 1 tablet (40 mg total) by mouth daily. 90 tablet 3  . silver sulfADIAZINE (SILVADENE) 1 % cream Apply 1 application topically 2 (two) times daily as needed. 50 g 0  . spironolactone (ALDACTONE) 25 MG tablet TAKE ONE TABLET BY MOUTH EVERY DAY 30 tablet 0  . TRUVADA 200-300 MG tablet     . elvitegravir-cobicistat-emtricitabine-tenofovir (GENVOYA) 150-150-200-10 MG TABS tablet Take 1 tablet by mouth daily with breakfast. (Patient not taking: Reported on 09/05/2015) 90 tablet 3  . ondansetron (ZOFRAN) 4 MG tablet Take 1-2 tablets (4-8 mg total) by mouth every 8 (eight) hours as needed for nausea or vomiting. (Patient not taking: Reported on 09/05/2015) 10 tablet 0  . oxyCODONE-acetaminophen (PERCOCET) 10-325 MG per tablet Take 1 tablet by mouth every 4 (four) hours as needed for pain. (Patient not taking: Reported on 09/05/2015) 50  tablet 0   No current facility-administered medications for this visit.    Social History   Social History  . Marital Status: Married    Spouse Name: N/A  . Number of Children: N/A  . Years of Education: N/A   Occupational History  . Not on file.   Social History Main Topics  . Smoking status: Never Smoker   . Smokeless tobacco: Never Used  . Alcohol Use: No  . Drug Use: No  . Sexual Activity:    Partners: Male    Birth Control/ Protection: Condom     Comment: pt. given condoms   Other Topics Concern  . Not on file   Social History Narrative    Family History  Problem Relation Age of Onset  . Cataracts Mother   . Hypertension Father   . Colon cancer Neg Hx       [name], Rukia Mcgillivray Caroline, MD 09/05/2015, 12:59 PM

## 2015-09-06 NOTE — Telephone Encounter (Signed)
thanks

## 2015-09-09 NOTE — Telephone Encounter (Addendum)
PA approved, rx ready for pick-up.  Pt notified.

## 2015-09-25 ENCOUNTER — Telehealth: Payer: Self-pay | Admitting: *Deleted

## 2015-09-25 NOTE — Telephone Encounter (Signed)
RN reviewed and emphasized that the pt is now taking both Prezista and Genvoya.  Pt verbalized understanding.

## 2015-09-25 NOTE — Addendum Note (Signed)
Addended by: Jennet MaduroESTRIDGE, DENISE D on: 09/25/2015 08:47 AM   Modules accepted: Orders, Medications

## 2015-10-26 ENCOUNTER — Other Ambulatory Visit: Payer: Self-pay | Admitting: Family Medicine

## 2015-12-02 ENCOUNTER — Other Ambulatory Visit: Payer: Self-pay

## 2015-12-02 DIAGNOSIS — Z1231 Encounter for screening mammogram for malignant neoplasm of breast: Secondary | ICD-10-CM

## 2015-12-03 ENCOUNTER — Telehealth: Payer: Self-pay

## 2015-12-03 NOTE — Telephone Encounter (Signed)
Patient's call returned to discuss her "hot" flashes and update with Dr Kara Mead Rossi's recommendations. No answer , left a detailed message that Dr Adolphus Birchwood is happy to prescribe Estradiol for hot flashes, but wants the patient to be informed of the risk involved with taking hormones. Taking hormones increases the patient's chances fore "breast Cancer" , patient instructed to call back with her discission . GYN/ONC contcact information given, will wait for return call .

## 2015-12-11 ENCOUNTER — Ambulatory Visit
Admission: RE | Admit: 2015-12-11 | Discharge: 2015-12-11 | Disposition: A | Discharge status: Home / Self Care | Payer: Commercial Managed Care - HMO | Source: Ambulatory Visit

## 2015-12-11 DIAGNOSIS — Z1231 Encounter for screening mammogram for malignant neoplasm of breast: Secondary | ICD-10-CM

## 2015-12-19 DIAGNOSIS — H5203 Hypermetropia, bilateral: Secondary | ICD-10-CM | POA: Diagnosis not present

## 2015-12-19 DIAGNOSIS — H524 Presbyopia: Secondary | ICD-10-CM | POA: Diagnosis not present

## 2015-12-19 DIAGNOSIS — Z0100 Encounter for examination of eyes and vision without abnormal findings: Secondary | ICD-10-CM | POA: Diagnosis not present

## 2015-12-19 DIAGNOSIS — H52209 Unspecified astigmatism, unspecified eye: Secondary | ICD-10-CM | POA: Diagnosis not present

## 2015-12-30 ENCOUNTER — Ambulatory Visit (INDEPENDENT_AMBULATORY_CARE_PROVIDER_SITE_OTHER): Payer: Commercial Managed Care - HMO | Admitting: Infectious Diseases

## 2015-12-30 ENCOUNTER — Other Ambulatory Visit (HOSPITAL_COMMUNITY)
Admission: RE | Admit: 2015-12-30 | Discharge: 2015-12-30 | Disposition: A | Discharge status: Home / Self Care | Payer: Commercial Managed Care - HMO | Source: Ambulatory Visit | Attending: Infectious Diseases | Admitting: Infectious Diseases

## 2015-12-30 ENCOUNTER — Encounter: Payer: Self-pay | Admitting: Infectious Diseases

## 2015-12-30 VITALS — BP 138/79 | HR 91 | Temp 97.4°F | Wt 152.5 lb

## 2015-12-30 DIAGNOSIS — Z113 Encounter for screening for infections with a predominantly sexual mode of transmission: Secondary | ICD-10-CM

## 2015-12-30 DIAGNOSIS — D071 Carcinoma in situ of vulva: Secondary | ICD-10-CM

## 2015-12-30 DIAGNOSIS — J329 Chronic sinusitis, unspecified: Secondary | ICD-10-CM | POA: Insufficient documentation

## 2015-12-30 DIAGNOSIS — E781 Pure hyperglyceridemia: Secondary | ICD-10-CM

## 2015-12-30 DIAGNOSIS — J0120 Acute ethmoidal sinusitis, unspecified: Secondary | ICD-10-CM

## 2015-12-30 DIAGNOSIS — I10 Essential (primary) hypertension: Secondary | ICD-10-CM | POA: Diagnosis not present

## 2015-12-30 DIAGNOSIS — B20 Human immunodeficiency virus [HIV] disease: Secondary | ICD-10-CM

## 2015-12-30 LAB — CBC
HEMATOCRIT: 37 % (ref 36.0–46.0)
HEMOGLOBIN: 12.4 g/dL (ref 12.0–15.0)
MCH: 31.9 pg (ref 26.0–34.0)
MCHC: 33.5 g/dL (ref 30.0–36.0)
MCV: 95.1 fL (ref 78.0–100.0)
MPV: 10.4 fL (ref 8.6–12.4)
PLATELETS: 195 10*3/uL (ref 150–400)
RBC: 3.89 MIL/uL (ref 3.87–5.11)
RDW: 13.5 % (ref 11.5–15.5)
WBC: 4.5 10*3/uL (ref 4.0–10.5)

## 2015-12-30 NOTE — Assessment & Plan Note (Signed)
Suspect lf She is too many days in to get benefit from tamiflu OTCs

## 2015-12-30 NOTE — Assessment & Plan Note (Signed)
On statin Will check today She has not eaten.

## 2015-12-30 NOTE — Assessment & Plan Note (Signed)
Will check her labs today She appears to be doing well Offered/refused condoms.  Has gotten flu shot.

## 2015-12-30 NOTE — Assessment & Plan Note (Signed)
Well controlled today, asx.   

## 2015-12-30 NOTE — Assessment & Plan Note (Signed)
Has f/u at [facility].

## 2015-12-30 NOTE — Progress Notes (Signed)
   Subjective:    Patient ID: Cathy Smith, female    DOB: 14-Apr-1966, 50 y.o.   MRN: [id number]  HPI 50 yo F with HIV+ with hx of drug resistance mutations.  She also has a hx of VIN III/CIS (has been getting laser tx), anterior cervical decompression (C4-6) with prosthesis and allograft.  Husband is HIV+ and follows with Dr Orvan Falconer.  Has had f/u with [facility] oncology center.   She has been maintained on TRV/DRVc/ISN until October 2016 when she was changed to [facility].   HIV 1 RNA QUANT (copies/mL)  Date Value  08/12/2015 <20  02/11/2015 <20  07/25/2014 <20   CD4 T CELL ABS (/uL)  Date Value  08/12/2015 210*  02/11/2015 190*  07/25/2014 200*    Today complains of "cold"- for past week, has had coughing, sneezing. No f/c. Taking alka-seltzer colds in the day, cough syrup at night.  No problems with new ART ("much better").  Has f/u at Onc on 01-06-16.     Review of Systems  Constitutional: Negative for appetite change and unexpected weight change.  Respiratory: Positive for cough.   Gastrointestinal: Negative for diarrhea and constipation.  Genitourinary: Negative for difficulty urinating.       Objective:   Physical Exam  Constitutional: She appears well-developed and well-nourished.  HENT:  Mouth/Throat: No oropharyngeal exudate.  Eyes: EOM are normal. Pupils are equal, round, and reactive to light.  Neck: Neck supple.  Cardiovascular: Normal rate, regular rhythm and normal heart sounds.   Pulmonary/Chest: Effort normal and breath sounds normal.  Abdominal: Soft. Bowel sounds are normal. There is no tenderness. There is no rebound.  Musculoskeletal: She exhibits no edema.  Lymphadenopathy:    She has no cervical adenopathy.      Assessment & Plan:

## 2015-12-31 LAB — COMPREHENSIVE METABOLIC PANEL
ALT: 33 U/L — ABNORMAL HIGH (ref 6–29)
AST: 25 U/L (ref 10–35)
Albumin: 4.3 g/dL (ref 3.6–5.1)
Alkaline Phosphatase: 73 U/L (ref 33–130)
BUN: 10 mg/dL (ref 7–25)
CHLORIDE: 98 mmol/L (ref 98–110)
CO2: 29 mmol/L (ref 20–31)
CREATININE: 1.03 mg/dL (ref 0.50–1.05)
Calcium: 9.3 mg/dL (ref 8.6–10.4)
GLUCOSE: 105 mg/dL — AB (ref 65–99)
Potassium: 4.3 mmol/L (ref 3.5–5.3)
SODIUM: 136 mmol/L (ref 135–146)
Total Bilirubin: 0.5 mg/dL (ref 0.2–1.2)
Total Protein: 7.7 g/dL (ref 6.1–8.1)

## 2015-12-31 LAB — HIV-1 RNA QUANT-NO REFLEX-BLD
HIV 1 RNA Quant: <20 copies/mL (ref <20)
HIV-1 RNA Quant, Log: <1.3 Log copies/mL (ref <1.30)

## 2015-12-31 LAB — LIPID PANEL
CHOL/HDL RATIO: 3.3 ratio (ref <=5.0)
Cholesterol: 144 mg/dL (ref 125–200)
HDL: 43 mg/dL — ABNORMAL LOW (ref >=46)
LDL CALC: 75 mg/dL (ref <130)
Triglycerides: 128 mg/dL (ref <150)
VLDL: 26 mg/dL (ref <30)

## 2015-12-31 LAB — T-HELPER CELL (CD4) - (RCID CLINIC ONLY)
CD4 % Helper T Cell: 16 % — ABNORMAL LOW (ref 33–55)
CD4 T Cell Abs: 170 /uL — ABNORMAL LOW (ref 400–2700)

## 2015-12-31 LAB — URINE CYTOLOGY ANCILLARY ONLY
Chlamydia: NEGATIVE
NEISSERIA GONORRHEA: NEGATIVE

## 2015-12-31 LAB — RPR

## 2016-01-06 ENCOUNTER — Encounter: Payer: Self-pay | Admitting: Gynecologic Oncology

## 2016-01-06 ENCOUNTER — Ambulatory Visit: Payer: Commercial Managed Care - HMO | Attending: Gynecologic Oncology | Admitting: Gynecologic Oncology

## 2016-01-06 ENCOUNTER — Other Ambulatory Visit (HOSPITAL_COMMUNITY)
Admission: RE | Admit: 2016-01-06 | Discharge: 2016-01-06 | Disposition: A | Discharge status: Home / Self Care | Payer: Commercial Managed Care - HMO | Source: Ambulatory Visit | Attending: Gynecologic Oncology | Admitting: Gynecologic Oncology

## 2016-01-06 VITALS — BP 128/90 | HR 85 | Temp 98.1°F | Resp 18 | Ht 61.0 in | Wt 152.6 lb

## 2016-01-06 DIAGNOSIS — A630 Anogenital (venereal) warts: Secondary | ICD-10-CM | POA: Insufficient documentation

## 2016-01-06 DIAGNOSIS — B20 Human immunodeficiency virus [HIV] disease: Secondary | ICD-10-CM | POA: Insufficient documentation

## 2016-01-06 DIAGNOSIS — I10 Essential (primary) hypertension: Secondary | ICD-10-CM | POA: Diagnosis not present

## 2016-01-06 DIAGNOSIS — D072 Carcinoma in situ of vagina: Secondary | ICD-10-CM | POA: Diagnosis not present

## 2016-01-06 DIAGNOSIS — Z1151 Encounter for screening for human papillomavirus (HPV): Secondary | ICD-10-CM | POA: Diagnosis not present

## 2016-01-06 DIAGNOSIS — Z01411 Encounter for gynecological examination (general) (routine) with abnormal findings: Secondary | ICD-10-CM | POA: Diagnosis not present

## 2016-01-06 DIAGNOSIS — R87612 Low grade squamous intraepithelial lesion on cytologic smear of cervix (LGSIL): Secondary | ICD-10-CM | POA: Diagnosis not present

## 2016-01-06 DIAGNOSIS — Z90710 Acquired absence of both cervix and uterus: Secondary | ICD-10-CM | POA: Diagnosis not present

## 2016-01-06 DIAGNOSIS — D071 Carcinoma in situ of vulva: Secondary | ICD-10-CM

## 2016-01-06 DIAGNOSIS — E781 Pure hyperglyceridemia: Secondary | ICD-10-CM | POA: Insufficient documentation

## 2016-01-06 NOTE — Progress Notes (Signed)
Followup Note: Gyn-Onc   Cathy Smith 50 y.o. female  Chief Complaint  Patient presents with  . 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 S/p extensive CO2 laser of vulva (circumferential, clitoral sparing) on 07/23/15. 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  Plan:  Repeat CO2 laser to anterior vulva.  Interval History Patient returns today for vulvar evaluation. She has no complaints.   Her last Pap smear in February 2016 showed ASCUS + hrHPV.   HPI: 50 year old African American female seen in consultation requested of [name], and [name] regarding management of a newly diagnosed severe dysplasia of the upper vagina. Briefly, the patient's history includes having a past history of abdominal hysterectomy for what we understand and have been fibroids. She denies any problems with Pap smears prior to the hysterectomy. More recently she has had some abnormal Pap smears ultimately resulting in colposcopy and directed biopsy on 04/07/2012. 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  The patient did well until March 2015 with a Pap smear showing high-grade dysplasia once again. In April and May 2015 the patient received 2 additional months of Efudex. From June 2015 through February 2016 her Pap smears only showed low-grade SIL.  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 She was scheduled for a wide local excision of the right anterior vulva which was performed on 05/06/15 and revealed VIN3. Margins were positive. Intraoperative evaluation showed lesions consistent with VIN3 on bilateral posterior labia. These were biopsied (but not  treated at the patient's request). These biopsies also returned as positive for VIN3.  On 07/23/15 she was taken to the OR for an extensive CO2 laser ablation of the circumferential labia minora and majora to ablate all acetowhite areas. No pathology was taken. The clitoris was spared.    Review of Systems:10 point review of systems is negative as noted above.   Vitals: Blood pressure 128/90, pulse 85, temperature 98.1 F (36.7 C), temperature source Oral, resp. rate 18, height  (1.549 m), weight 152 lb 9.6 oz (69.219 kg).  Physical Exam: General : The patient is a healthy woman in no acute distress.  HEENT: normocephalic, extraoccular movements normal; neck is supple without thyromegally  Lynphnodes: Supraclavicular and inguinal nodes not enlarged  Abdomen: Soft, non-tender, no ascites, no organomegally, no masses, no hernias  Pelvic:  EGBUS: Acetic acid applied (4%). well healed labia majora and minora. Leukoplakia noted on anterior bilateral labia minora consistent with VIN, involves clitoris. Pigmentation extending anteriorally towards mons. Extension of acetowhite changes on mid left introitus and labia minora. Pap taken. Vagina: Normal, no lesions  Urethra and Bladder: Normal, non-tender  Cervix: Surgically absent  Uterus: Surgically absent  Bi-manual examination: Non-tender; no adenxal masses or nodularity  Rectal: normal sphincter tone, no masses, no blood  Lower extremities: No edema or varicosities. Normal range of motion      No Known Allergies  Past Medical History  Diagnosis Date  . [facility] infection ([facility]) [facility]  . Hypertension   . Hypertriglyceridemia without hypercholesterolemia   . History of condyloma acuminatum   . History of uterine fibroid   .  Vulvar dysplasia   . History of vaginal dysplasia     VAIN III  . Wears glasses     Past Surgical History  Procedure Laterality Date  . Anterior cervical  decomp/discectomy fusion  11-30-2009    C4 -- C6  . I & d left buttock abscess  04-28-2001  . Colonoscopy  01-28-2015  . Abdominal hysterectomy  1995 approx  . Vulvectomy Right 05/06/2015    Procedure: WIDE LOCAL  EXCISION  OF RIGHT VULVA;  Surgeon: Adolphus Birchwood, MD;  Location: [facility] [place];  Service: Gynecology;  Laterality: Right;  . Co2 laser application Bilateral 07/23/2015    Procedure: BILATERAL CO2 LASER ABLATION OF THE VULVA;  Surgeon: Adolphus Birchwood, MD;  Location: [facility] [place];  Service: Gynecology;  Laterality: Bilateral;    Current Outpatient Prescriptions  Medication Sig Dispense Refill  . conjugated estrogens (PREMARIN) vaginal cream Apply to vaginal tissues at night before bed on Monday, Wednesday, Friday. Do not wipe off. 42.5 g 12  . darunavir (PREZISTA) 800 MG tablet Take 1 tablet (800 mg total) by mouth daily. 90 tablet 3  . elvitegravir-cobicistat-emtricitabine-tenofovir (GENVOYA) 150-150-200-10 MG TABS tablet Take 1 tablet by mouth daily with breakfast. 90 tablet 3  . Multiple Vitamin (MULTIVITAMIN) tablet Take 1 tablet by mouth daily.    . pravastatin (PRAVACHOL) 40 MG tablet Take 1 tablet (40 mg total) by mouth daily. 90 tablet 3  . silver sulfADIAZINE (SILVADENE) 1 % cream Apply 1 application topically 2 (two) times daily as needed. 50 g 0  . spironolactone (ALDACTONE) 25 MG tablet TAKE ONE TABLET BY MOUTH EVERY DAY 90 tablet 0   No current facility-administered medications for this visit.    Social History   Social History  . Marital Status: Married    Spouse Name: N/A  . Number of Children: N/A  . Years of Education: N/A   Occupational History  . Not on file.   Social History Main Topics  . Smoking status: Never Smoker   . Smokeless tobacco: Never Used  . Alcohol Use: No  . Drug Use: No  . Sexual Activity:    Partners: Male    Birth Control/ Protection: Condom     Comment: pt. given condoms   Other Topics Concern  . Not on  file   Social History Narrative    Family History  Problem Relation Age of Onset  . Cataracts Mother   . Hypertension Father   . Colon cancer Neg Hx       Quinn Axe, MD 01/06/2016, 2:03 PM

## 2016-01-06 NOTE — Patient Instructions (Signed)
Plan for CO2 laser of the vulva on April 18 at the [facility].  You will receive a phone call from the surgery center to discuss the instructions prior to your procedure.  Please call for any questions or concerns.

## 2016-01-08 ENCOUNTER — Ambulatory Visit: Payer: Commercial Managed Care - HMO | Admitting: Family Medicine

## 2016-01-09 LAB — CYTOLOGY - PAP

## 2016-01-13 ENCOUNTER — Ambulatory Visit (INDEPENDENT_AMBULATORY_CARE_PROVIDER_SITE_OTHER): Payer: Commercial Managed Care - HMO | Admitting: Family Medicine

## 2016-01-13 ENCOUNTER — Encounter: Payer: Self-pay | Admitting: Family Medicine

## 2016-01-13 VITALS — BP 113/74 | HR 68 | Temp 97.8°F | Ht 60.0 in | Wt 150.0 lb

## 2016-01-13 DIAGNOSIS — E781 Pure hyperglyceridemia: Secondary | ICD-10-CM | POA: Diagnosis not present

## 2016-01-13 DIAGNOSIS — I10 Essential (primary) hypertension: Secondary | ICD-10-CM

## 2016-01-13 DIAGNOSIS — Z23 Encounter for immunization: Secondary | ICD-10-CM

## 2016-01-13 MED ORDER — SPIRONOLACTONE 25 MG PO TABS: 25.0000 mg | ORAL_TABLET | Freq: Every day | ORAL | Status: DC

## 2016-01-13 NOTE — Patient Instructions (Signed)
Continue current medications. Watch salt, fats, cholesterol and carbs.

## 2016-01-13 NOTE — Progress Notes (Signed)
Patient ID: Cathy DillingRenee C Smith, female   DOB: 10/08/1966, 50 y.o.   MRN: [id number]   Cathy Smith Smith, is a 50 y.o. female  [id number]  [id number]  DOB - 01/15/1966  CC:  Chief Complaint  Patient presents with  . follow up    doing well no complaints       HPI: Cathy Smith Chrisley is a 50 y.o. female here for routine follow-up of hypertension and hypertriclyceride. She is currently on Aldactone 25 mg daily and pravastatin 40 mg. She is HIV positive and if followed by [facility]. She has a history of vuvular and vaginal cancer and is followed by GYN-Oncology. She denies complaints today.  Health Maintenance:  Up to date except she does need a Prevnar 13. Her Pap, mammogram and colonoscopy are up to date.  No Known Allergies Past Medical History  Diagnosis Date  . HIV infection (HCC) [facility] hat[facility]  . Hypertension   . Hypertriglyceridemia without hypercholesterolemia   . History of condyloma acuminatum   . History of uterine fibroid   . Vulvar dysplasia   . History of vaginal dysplasia     VAIN III  . Wears glasses    Current Outpatient Prescriptions on File Prior to Visit  Medication Sig Dispense Refill  . darunavir (PREZISTA) 800 MG tablet Take 1 tablet (800 mg total) by mouth daily. 90 tablet 3  . elvitegravir-cobicistat-emtricitabine-tenofovir (GENVOYA) 150-150-200-10 MG TABS tablet Take 1 tablet by mouth daily with breakfast. 90 tablet 3  . pravastatin (PRAVACHOL) 40 MG tablet Take 1 tablet (40 mg total) by mouth daily. 90 tablet 3  . conjugated estrogens (PREMARIN) vaginal cream Apply to vaginal tissues at night before bed on Monday, Wednesday, Friday. Do not wipe off. (Patient not taking: Reported on 01/13/2016) 42.5 g 12  . Multiple Vitamin (MULTIVITAMIN) tablet Take 1 tablet by mouth daily. Reported on 01/13/2016    . silver sulfADIAZINE (SILVADENE) 1 % cream Apply 1 application topically 2 (two) times daily as needed. (Patient not taking: Reported on  01/13/2016) 50 g 0   No current facility-administered medications on file prior to visit.   Family History  Problem Relation Age of Onset  . Cataracts Mother   . Hypertension Father   . Colon cancer Neg Hx    Social History   Social History  . Marital Status: Married    Spouse Name: N/A  . Number of Children: N/A  . Years of Education: N/A   Occupational History  . Not on file.   Social History Main Topics  . Smoking status: Never Smoker   . Smokeless tobacco: Never Used  . Alcohol Use: No  . Drug Use: No  . Sexual Activity:    Partners: Male    Birth Control/ Protection: Condom     Comment: pt. given condoms   Other Topics Concern  . Not on file   Social History Narrative    Review of Systems: Constitutional: Negative for fever, chills, appetite change, weight loss,  Fatigue. Skin: Negative for rashes or lesions of concern. HENT: Negative for ear pain, ear discharge.nose bleeds Eyes: Negative for pain, discharge, redness, itching and visual disturbance. Neck: Negative for pain, stiffness Respiratory: Negative for cough, shortness of breath,   Cardiovascular: Negative for chest pain, palpitations and leg swelling. Gastrointestinal: Negative for abdominal pain, nausea, vomiting, diarrhea, constipations Genitourinary: Negative for dysuria, urgency, frequency, hematuria,  Musculoskeletal: Negative for back pain, joint pain, joint  swelling, and gait problem.Negative for weakness. Neurological:  Negative for dizziness, tremors, seizures, syncope,   light-headedness, numbness and headaches.  Hematological: Negative for easy bruising or bleeding Psychiatric/Behavioral: Negative for depression, anxiety, decreased concentration, confusion   Objective:   Filed Vitals:   01/13/16 1409  BP: 113/74  Pulse: 68  Temp: 97.8 F (36.6 C)    Physical Exam: Constitutional: Patient appears well-developed and well-nourished. No distress. HENT: Normocephalic, atraumatic,  External right and left ear normal. Oropharynx is clear and moist.  Eyes: Conjunctivae and EOM are normal. PERRLA, no scleral icterus. Neck: Normal ROM. Neck supple. No lymphadenopathy, No thyromegaly. CVS: RRR, S1/S2 +, no murmurs, no gallops, no rubs Pulmonary: Effort and breath sounds normal, no stridor, rhonchi, wheezes, rales.  Abdominal: Soft. Normoactive BS,, no distension, tenderness, rebound or guarding.  Musculoskeletal: Normal range of motion. No edema and no tenderness.  Neuro: Alert.Normal muscle tone coordination. Non-focal Skin: Skin is warm and dry. No rash noted. Not diaphoretic. No erythema. No pallor. Psychiatric: Normal mood and affect. Behavior, judgment, thought content normal.  Lab Results  Component Value Date   WBC 4.5 12/30/2015   HGB 12.4 12/30/2015   HCT 37.0 12/30/2015   MCV 95.1 12/30/2015   PLT 195 12/30/2015   Lab Results  Component Value Date   CREATININE 1.03 12/30/2015   BUN 10 12/30/2015   NA 136 12/30/2015   K 4.3 12/30/2015   CL 98 12/30/2015   CO2 29 12/30/2015    No results found for: HGBA1C Lipid Panel     Component Value Date/Time   CHOL 144 12/30/2015 1110   TRIG 128 12/30/2015 1110   HDL 43* 12/30/2015 1110   CHOLHDL 3.3 12/30/2015 1110   VLDL 26 12/30/2015 1110   LDLCALC 75 12/30/2015 1110       Assessment and plan:   1. Need for pneumococcal vaccination  - Pneumococcal conjugate vaccine 13-valent  2. Essential hypertension  - spironolactone (ALDACTONE) 25 MG tablet; Take 1 tablet (25 mg total) by mouth daily.  Dispense: 90 tablet; Refill: 3   Return in about 1 year (around 01/12/2017).  The patient was given clear instructions to go to ER or return to medical center if symptoms don't improve, worsen or new problems develop. The patient verbalized understanding.    Henrietta Hoover FNP  01/13/2016, 2:34 PM

## 2016-01-16 ENCOUNTER — Telehealth: Payer: Self-pay | Admitting: Gynecologic Oncology

## 2016-01-16 NOTE — Telephone Encounter (Signed)
Informed of pap smear results.  No intervention necessary at this time.  Will continue to monitor but advised she would still need to proceed with the laser of the vulva.  Advised to call for any needs.

## 2016-01-21 DIAGNOSIS — Z0100 Encounter for examination of eyes and vision without abnormal findings: Secondary | ICD-10-CM | POA: Diagnosis not present

## 2016-02-18 ENCOUNTER — Encounter (HOSPITAL_BASED_OUTPATIENT_CLINIC_OR_DEPARTMENT_OTHER): Payer: Self-pay | Admitting: *Deleted

## 2016-02-18 NOTE — Progress Notes (Signed)
NPO AFTER MN.  ARRIVE AT 0800.  NEEDS ISTAT AND EKG. 

## 2016-02-24 ENCOUNTER — Telehealth: Payer: Self-pay | Admitting: Gynecologic Oncology

## 2016-02-24 NOTE — Telephone Encounter (Signed)
Patient called the office concerned stating she had not received a phone call from the Surgery Center going over her instructions for tomorrow.  Advised patient I would reach out to the Surgery Center and then give her a return call.  Spoke with Angelique Blonderenise at [facility].  She spoke with the patient on the 11th and also this am.    Called the patient back and informed her to be at the Center at 8 am, NPO after midnight, and to take no medications. Verbalizing understanding.  Had to repeat the instructions several times.

## 2016-02-25 ENCOUNTER — Encounter (HOSPITAL_BASED_OUTPATIENT_CLINIC_OR_DEPARTMENT_OTHER): Payer: Self-pay | Admitting: *Deleted

## 2016-02-25 ENCOUNTER — Ambulatory Visit (HOSPITAL_BASED_OUTPATIENT_CLINIC_OR_DEPARTMENT_OTHER): Payer: Commercial Managed Care - HMO | Admitting: Anesthesiology

## 2016-02-25 ENCOUNTER — Encounter (HOSPITAL_BASED_OUTPATIENT_CLINIC_OR_DEPARTMENT_OTHER)
Admission: RE | Disposition: A | Discharge status: Home / Self Care | Payer: Self-pay | Source: Ambulatory Visit | Attending: Gynecologic Oncology

## 2016-02-25 ENCOUNTER — Ambulatory Visit (HOSPITAL_BASED_OUTPATIENT_CLINIC_OR_DEPARTMENT_OTHER)
Admission: RE | Admit: 2016-02-25 | Discharge: 2016-02-25 | Disposition: A | Discharge status: Home / Self Care | Payer: Commercial Managed Care - HMO | Source: Ambulatory Visit | Attending: Gynecologic Oncology | Admitting: Gynecologic Oncology

## 2016-02-25 DIAGNOSIS — I10 Essential (primary) hypertension: Secondary | ICD-10-CM | POA: Insufficient documentation

## 2016-02-25 DIAGNOSIS — D071 Carcinoma in situ of vulva: Secondary | ICD-10-CM | POA: Diagnosis present

## 2016-02-25 DIAGNOSIS — Z90710 Acquired absence of both cervix and uterus: Secondary | ICD-10-CM | POA: Insufficient documentation

## 2016-02-25 DIAGNOSIS — Z21 Asymptomatic human immunodeficiency virus [HIV] infection status: Secondary | ICD-10-CM | POA: Diagnosis not present

## 2016-02-25 DIAGNOSIS — Z79899 Other long term (current) drug therapy: Secondary | ICD-10-CM | POA: Diagnosis not present

## 2016-02-25 DIAGNOSIS — D072 Carcinoma in situ of vagina: Secondary | ICD-10-CM | POA: Diagnosis not present

## 2016-02-25 DIAGNOSIS — E781 Pure hyperglyceridemia: Secondary | ICD-10-CM | POA: Diagnosis not present

## 2016-02-25 HISTORY — DX: Other specified postprocedural states: Z98.890

## 2016-02-25 HISTORY — DX: Carcinoma in situ of vulva: D07.1

## 2016-02-25 HISTORY — PX: CO2 LASER APPLICATION: SHX5778

## 2016-02-25 HISTORY — DX: Other specified postprocedural states: R11.2

## 2016-02-25 LAB — POCT I-STAT 4, (NA,K, GLUC, HGB,HCT)
GLUCOSE: 114 mg/dL — AB (ref 65–99)
HCT: 37 % (ref 36.0–46.0)
HEMOGLOBIN: 12.6 g/dL (ref 12.0–15.0)
POTASSIUM: 3.9 mmol/L (ref 3.5–5.1)
Sodium: 141 mmol/L (ref 135–145)

## 2016-02-25 SURGERY — CO2 LASER APPLICATION
Anesthesia: General | Site: Vulva

## 2016-02-25 MED ORDER — SCOPOLAMINE 1 MG/3DAYS TD PT72
1.0000 | MEDICATED_PATCH | TRANSDERMAL | Status: DC
  Administered 2016-02-25: 1.5 mg via TRANSDERMAL
  Filled 2016-02-25: qty 1

## 2016-02-25 MED ORDER — LIDOCAINE HCL (CARDIAC) 20 MG/ML IV SOLN
INTRAVENOUS | Status: DC | PRN
  Administered 2016-02-25: 100 mg via INTRAVENOUS

## 2016-02-25 MED ORDER — FENTANYL CITRATE (PF) 100 MCG/2ML IJ SOLN
INTRAMUSCULAR | Status: AC
  Filled 2016-02-25: qty 2

## 2016-02-25 MED ORDER — DEXAMETHASONE SODIUM PHOSPHATE 4 MG/ML IJ SOLN
INTRAMUSCULAR | Status: DC | PRN
  Administered 2016-02-25: 10 mg via INTRAVENOUS

## 2016-02-25 MED ORDER — SILVER SULFADIAZINE 1 % EX CREA
TOPICAL_CREAM | CUTANEOUS | Status: DC | PRN
  Administered 2016-02-25: 1 via TOPICAL

## 2016-02-25 MED ORDER — HYDROCODONE-ACETAMINOPHEN 7.5-325 MG PO TABS
1.0000 | ORAL_TABLET | Freq: Once | ORAL | Status: AC | PRN
  Administered 2016-02-25: 1 via ORAL
  Filled 2016-02-25: qty 1

## 2016-02-25 MED ORDER — LIDOCAINE HCL (CARDIAC) 20 MG/ML IV SOLN
INTRAVENOUS | Status: AC
  Filled 2016-02-25: qty 5

## 2016-02-25 MED ORDER — PROPOFOL 10 MG/ML IV BOLUS
INTRAVENOUS | Status: DC | PRN
  Administered 2016-02-25: 150 mg via INTRAVENOUS

## 2016-02-25 MED ORDER — PROPOFOL 10 MG/ML IV BOLUS
INTRAVENOUS | Status: AC
  Filled 2016-02-25: qty 20

## 2016-02-25 MED ORDER — DOCUSATE SODIUM 100 MG PO CAPS: 100.0000 mg | ORAL_CAPSULE | Freq: Two times a day (BID) | ORAL | Status: DC

## 2016-02-25 MED ORDER — ONDANSETRON HCL 4 MG/2ML IJ SOLN
INTRAMUSCULAR | Status: AC
  Filled 2016-02-25: qty 2

## 2016-02-25 MED ORDER — KETOROLAC TROMETHAMINE 30 MG/ML IJ SOLN
INTRAMUSCULAR | Status: DC | PRN
  Administered 2016-02-25: 30 mg via INTRAVENOUS

## 2016-02-25 MED ORDER — OXYCODONE-ACETAMINOPHEN 10-325 MG PO TABS: 1.0000 | ORAL_TABLET | ORAL | Status: DC | PRN

## 2016-02-25 MED ORDER — HYDROMORPHONE HCL 1 MG/ML IJ SOLN
0.2500 mg | INTRAMUSCULAR | Status: DC | PRN
  Filled 2016-02-25: qty 1

## 2016-02-25 MED ORDER — KETOROLAC TROMETHAMINE 30 MG/ML IJ SOLN
30.0000 mg | Freq: Once | INTRAMUSCULAR | Status: DC | PRN
  Filled 2016-02-25: qty 1

## 2016-02-25 MED ORDER — FENTANYL CITRATE (PF) 100 MCG/2ML IJ SOLN
INTRAMUSCULAR | Status: DC | PRN
  Administered 2016-02-25: 50 ug via INTRAVENOUS

## 2016-02-25 MED ORDER — ONDANSETRON HCL 4 MG/2ML IJ SOLN
INTRAMUSCULAR | Status: DC | PRN
  Administered 2016-02-25: 4 mg via INTRAVENOUS

## 2016-02-25 MED ORDER — PROMETHAZINE HCL 25 MG/ML IJ SOLN
6.2500 mg | INTRAMUSCULAR | Status: DC | PRN
  Filled 2016-02-25: qty 1

## 2016-02-25 MED ORDER — LIDOCAINE HCL 1 % IJ SOLN
INTRAMUSCULAR | Status: DC | PRN
  Administered 2016-02-25: 17 mL

## 2016-02-25 MED ORDER — ACETIC ACID 5 % SOLN
Status: DC | PRN
Start: 2016-02-25 — End: 2016-02-25
  Administered 2016-02-25: 1 via TOPICAL

## 2016-02-25 MED ORDER — MIDAZOLAM HCL 2 MG/2ML IJ SOLN
INTRAMUSCULAR | Status: AC
  Filled 2016-02-25: qty 2

## 2016-02-25 MED ORDER — HYDROCODONE-ACETAMINOPHEN 7.5-325 MG PO TABS
ORAL_TABLET | ORAL | Status: AC
  Filled 2016-02-25: qty 1

## 2016-02-25 MED ORDER — DEXAMETHASONE SODIUM PHOSPHATE 10 MG/ML IJ SOLN
INTRAMUSCULAR | Status: AC
  Filled 2016-02-25: qty 1

## 2016-02-25 MED ORDER — LACTATED RINGERS IV SOLN
INTRAVENOUS | Status: DC
  Administered 2016-02-25 (×2): via INTRAVENOUS
  Filled 2016-02-25: qty 1000

## 2016-02-25 MED ORDER — MIDAZOLAM HCL 5 MG/5ML IJ SOLN
INTRAMUSCULAR | Status: DC | PRN
  Administered 2016-02-25: 2 mg via INTRAVENOUS

## 2016-02-25 MED ORDER — SCOPOLAMINE 1 MG/3DAYS TD PT72
MEDICATED_PATCH | TRANSDERMAL | Status: AC
  Filled 2016-02-25: qty 1

## 2016-02-25 SURGICAL SUPPLY — 53 items
APPLICATOR COTTON TIP 6IN STRL (MISCELLANEOUS) ×4 IMPLANT
BAG DRN ANRFLXCHMBR STRAP LEK (BAG)
BAG URINE DRAINAGE (UROLOGICAL SUPPLIES) IMPLANT
BAG URINE LEG 19OZ MD ST LTX (BAG) IMPLANT
BLADE SURG 15 STRL LF DISP TIS (BLADE) IMPLANT
BLADE SURG 15 STRL SS (BLADE)
CANISTER SUCTION 1200CC (MISCELLANEOUS) IMPLANT
CANISTER SUCTION 2500CC (MISCELLANEOUS) IMPLANT
CATH FOLEY 2WAY SLVR  5CC 16FR (CATHETERS)
CATH FOLEY 2WAY SLVR 5CC 16FR (CATHETERS) IMPLANT
CATH ROBINSON RED A/P 16FR (CATHETERS) IMPLANT
CLOTH BEACON ORANGE TIMEOUT ST (SAFETY) ×2 IMPLANT
COVER BACK TABLE 60X90IN (DRAPES) ×2 IMPLANT
DEPRESSOR TONGUE BLADE STERILE (MISCELLANEOUS) ×2 IMPLANT
DRAPE LG THREE QUARTER DISP (DRAPES) IMPLANT
DRAPE UNDERBUTTOCKS STRL (DRAPE) ×2 IMPLANT
DRSG TELFA 3X8 NADH (GAUZE/BANDAGES/DRESSINGS) IMPLANT
ELECT BALL LEEP 3MM BLK (ELECTRODE) IMPLANT
ELECT REM PT RETURN 9FT ADLT (ELECTROSURGICAL) ×2
ELECTRODE REM PT RTRN 9FT ADLT (ELECTROSURGICAL) ×1 IMPLANT
GLOVE BIO SURGEON STRL SZ 6 (GLOVE) ×2 IMPLANT
GLOVE BIOGEL PI IND STRL 7.0 (GLOVE) IMPLANT
GLOVE BIOGEL PI IND STRL 7.5 (GLOVE) IMPLANT
GLOVE BIOGEL PI INDICATOR 7.0 (GLOVE) ×1
GLOVE BIOGEL PI INDICATOR 7.5 (GLOVE) ×2
GLOVE SURG SS PI 7.5 STRL IVOR (GLOVE) ×2 IMPLANT
GOWN STRL REUS W/ TWL LRG LVL3 (GOWN DISPOSABLE) ×2 IMPLANT
GOWN STRL REUS W/TWL LRG LVL3 (GOWN DISPOSABLE) ×5 IMPLANT
KIT ROOM TURNOVER WOR (KITS) ×2 IMPLANT
LEGGING LITHOTOMY PAIR STRL (DRAPES) IMPLANT
MANIFOLD NEPTUNE II (INSTRUMENTS) IMPLANT
NDL HYPO 25X1 1.5 SAFETY (NEEDLE) IMPLANT
NEEDLE HYPO 25X1 1.5 SAFETY (NEEDLE) IMPLANT
NS IRRIG 500ML POUR BTL (IV SOLUTION) IMPLANT
PACK BASIN DAY SURGERY FS (CUSTOM PROCEDURE TRAY) ×2 IMPLANT
PAD DRESSING TELFA 3X8 NADH (GAUZE/BANDAGES/DRESSINGS) IMPLANT
PAD OB MATERNITY 4.3X12.25 (PERSONAL CARE ITEMS) ×2 IMPLANT
PAD PREP 24X48 CUFFED NSTRL (MISCELLANEOUS) ×2 IMPLANT
PENCIL BUTTON HOLSTER BLD 10FT (ELECTRODE) IMPLANT
SCOPETTES 8  STERILE (MISCELLANEOUS) ×2
SCOPETTES 8 STERILE (MISCELLANEOUS) ×2 IMPLANT
SUT VIC AB 2-0 SH 27 (SUTURE)
SUT VIC AB 2-0 SH 27XBRD (SUTURE) IMPLANT
SUT VIC AB 3-0 PS2 18 (SUTURE)
SUT VIC AB 3-0 PS2 18XBRD (SUTURE) IMPLANT
SYR CONTROL 10ML LL (SYRINGE) IMPLANT
TOWEL OR 17X24 6PK STRL BLUE (TOWEL DISPOSABLE) ×4 IMPLANT
TRAY DSU PREP LF (CUSTOM PROCEDURE TRAY) ×2 IMPLANT
TUBE CONNECTING 12X1/4 (SUCTIONS) ×2 IMPLANT
VACUUM HOSE 7/8X10 W/ WAND (MISCELLANEOUS) IMPLANT
VACUUM HOSE/TUBING 7/8INX6FT (MISCELLANEOUS) ×2 IMPLANT
WATER STERILE IRR 500ML POUR (IV SOLUTION) ×2 IMPLANT
YANKAUER SUCT BULB TIP NO VENT (SUCTIONS) ×2 IMPLANT

## 2016-02-25 NOTE — Op Note (Signed)
PATIENT: Cathy FettersRenee Smith ENCOUNTER DATE: 02/25/16   Preop Diagnosis: recurrent VIN III  Postoperative Diagnosis: same  Surgery: Partial simple CO2 laser of anterior vulva  Surgeons:  [name], Dorlisa Savino Caroline, MD Assistant: none  Anesthesia: General   Estimated blood loss: <7420ml  IVF:  200ml   Urine output: 200 ml   Complications: None   Pathology: none    Operative findings: acetowhite changes on anterior labia minora and clitoral hood, and extending on left to mid left labia minora.  Procedure: The patient was identified in the preoperative holding area. Informed consent was signed on the chart. Patient was seen history was reviewed and exam was performed.   The patient was then taken to the operating room and placed in the supine position with SCD hose on. General anesthesia was then induced without difficulty. She was then placed in the dorsolithotomy position. The perineum was prepped with Betadine. The vagina was prepped with Betadine. The patient was then draped after the prep was dried. A Foley catheter was inserted into the bladder under sterile conditions for an in and out cath.  Timeout was performed the patient, procedure, antibiotic, allergy, and length of procedure. 5% acetic acid solution was applied to the perineum. The vulvar tissues were inspected for areas of acetowhite changes or leukoplakia. The lesion was identified and the marking pen was used to circumscribe the area with appropriate surgical margins. The subcuticular tissues were infiltrated with 1% lidocaine. The personnel and patient were fitted with laser appropriate eyewear. The laser was sset to 12 watts continuous. The laser was tested for accuracy. The patient was draped with wet towels. Suction was employed to evacuate the plume.   The laser was used to ablate the demarcated area of VIN3 on the anterior labia minora, clitoral hood and extending to the left mid labia minora. It was an extensive laser of  approximately 50% of the vulvar surface area. The eschar was wiped away to ensure that there was appropriate depth of penetration.  All instrument, suture, laparotomy, Ray-Tec, and needle counts were correct x2. The patient tolerated the procedure well and was taken recovery room in stable condition. This is Cathy BirchwoodEmma Saahir Smith dictating an operative note on Cathy PantherRenee Rieke.  [name], Charron Coultas Caroline, MD

## 2016-02-25 NOTE — Anesthesia Procedure Notes (Signed)
Procedure Name: LMA Insertion Date/Time: 02/25/2016 9:37 AM Performed by: Maris BergerENENNY, Merna Baldi T Pre-anesthesia Checklist: Patient identified, Emergency Drugs available, Suction available and Patient being monitored Patient Re-evaluated:Patient Re-evaluated prior to inductionOxygen Delivery Method: Circle System Utilized Preoxygenation: Pre-oxygenation with 100% oxygen Intubation Type: IV induction Ventilation: Mask ventilation without difficulty LMA: LMA inserted LMA Size: 4.0 Number of attempts: 1 Airway Equipment and Method: Bite block Placement Confirmation: positive ETCO2 Dental Injury: Teeth and Oropharynx as per pre-operative assessment

## 2016-02-25 NOTE — Transfer of Care (Signed)
Immediate Anesthesia Transfer of Care Note  Patient: Altamese DillingRenee C Oestreich  Procedure(s) Performed: Procedure(s): CO2 LASER OF THE VULVAR (N/A)  Patient Location: PACU  Anesthesia Type:General  Level of Consciousness: sedated and responds to stimulation  Airway & Oxygen Therapy: Patient Spontanous Breathing and Patient connected to nasal cannula oxygen  Post-op Assessment: Report given to RN  Post vital signs: Reviewed and stable  Last Vitals: 147/92, 78,16,98% Filed Vitals:   02/25/16 0813  BP: 111/67  Pulse: 87  Temp: 36.6 C  Resp: 16    Complications: No apparent anesthesia complications

## 2016-02-25 NOTE — H&P (Signed)
Chief Complaint   Patient presents with   .  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  S/p extensive CO2 laser of vulva (circumferential, clitoral sparing) on 07/23/15. 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  Plan: Repeat CO2 laser to anterior vulva.  Interval History  Patient returns today for vulvar evaluation. She has no complaints.  Her last Pap smear in February 2016 showed ASCUS + hrHPV.  HPI: 50 year old African American female seen in consultation requested of [name], and Dr. Marice Potterove regarding management of a newly diagnosed severe dysplasia of the upper vagina. Briefly, the patient's history includes having a past history of abdominal hysterectomy for what we understand and have been fibroids. She denies any problems with Pap smears prior to the hysterectomy. More recently she has had some abnormal Pap smears ultimately resulting in colposcopy and directed biopsy on 04/07/2012. 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  The patient is HIV infected and currently under going antiretrovirals therapy.  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  The patient did well until March 2015 with a Pap smear showing high-grade dysplasia once again. In April and May 2015 the patient received 2 additional months of Efudex. From June 2015 through February 2016 her Pap smears only showed low-grade SIL.  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 She was scheduled for a wide local excision of the right anterior vulva which was performed on 05/06/15 and revealed VIN3. Margins were positive. Intraoperative evaluation showed lesions consistent with VIN3 on bilateral posterior labia. These were biopsied (but not treated at the patient's request). These biopsies also returned  as positive for VIN3.  On 07/23/15 she was taken to the OR for an extensive CO2 laser ablation of the circumferential labia minora and majora to ablate all acetowhite areas. No pathology was taken. The clitoris was spared.  Review of Systems:10 point review of systems is negative as noted above.  Vitals: Blood pressure 128/90, pulse 85, temperature 98.1 F (36.7 C), temperature source Oral, resp. rate 18, height 5\' 1"  (1.549 m), weight 152 lb 9.6 oz (69.219 kg).  Physical Exam:  General : The patient is a healthy woman in no acute distress.  HEENT: normocephalic, extraoccular movements normal; neck is supple without thyromegally  Lynphnodes: Supraclavicular and inguinal nodes not enlarged  Abdomen: Soft, non-tender, no ascites, no organomegally, no masses, no hernias  Pelvic:  EGBUS: Acetic acid applied (4%). well healed labia majora and minora. Leukoplakia noted on anterior bilateral labia minora consistent with VIN, involves clitoris. Pigmentation extending anteriorally towards mons. Extension of acetowhite changes on mid left introitus and labia minora. Pap taken.  Vagina: Normal, no lesions  Urethra and Bladder: Normal, non-tender  Cervix: Surgically absent  Uterus: Surgically absent  Bi-manual examination: Non-tender; no adenxal masses or nodularity  Rectal: normal sphincter tone, no masses, no blood  Lower extremities: No edema or varicosities. Normal range of motion  No Known Allergies  Past Medical History   Diagnosis  Date   .  HIV infection (HCC)  [facility]- [facility] hat[facility]   .  Hypertension    .  Hypertriglyceridemia without hypercholesterolemia    .  History of condyloma acuminatum    .  History of uterine fibroid    .  Vulvar dysplasia    .  History of vaginal dysplasia      VAIN III   .  Wears glasses     Past Surgical History   Procedure  Laterality  Date   .  Anterior cervical decomp/discectomy fusion   11-30-2009     C4 -- C6   .  I & d left  buttock abscess   04-28-2001   .  Colonoscopy   01-28-2015   .  Abdominal hysterectomy   1995 approx   .  Vulvectomy  Right  05/06/2015     Procedure: WIDE LOCAL EXCISION OF RIGHT VULVA; Surgeon: Adolphus Birchwood, MD; Location: [facility]; Service: Gynecology; Laterality: Right;   .  Co2 laser application  Bilateral  07/23/2015     Procedure: BILATERAL CO2 LASER ABLATION OF THE VULVA; Surgeon: Adolphus Birchwood, MD; Location: [facility]; Service: Gynecology; Laterality: Bilateral;    Current Outpatient Prescriptions   Medication  Sig  Dispense  Refill   .  conjugated estrogens (PREMARIN) vaginal cream  Apply to vaginal tissues at night before bed on Monday, Wednesday, Friday. Do not wipe off.  42.5 g  12   .  darunavir (PREZISTA) 800 MG tablet  Take 1 tablet (800 mg total) by mouth daily.  90 tablet  3   .  elvitegravir-cobicistat-emtricitabine-tenofovir (GENVOYA) 150-150-200-10 MG TABS tablet  Take 1 tablet by mouth daily with breakfast.  90 tablet  3   .  Multiple Vitamin (MULTIVITAMIN) tablet  Take 1 tablet by mouth daily.     .  pravastatin (PRAVACHOL) 40 MG tablet  Take 1 tablet (40 mg total) by mouth daily.  90 tablet  3   .  silver sulfADIAZINE (SILVADENE) 1 % cream  Apply 1 application topically 2 (two) times daily as needed.  50 g  0   .  spironolactone (ALDACTONE) 25 MG tablet  TAKE ONE TABLET BY MOUTH EVERY DAY  90 tablet  0    No current facility-administered medications for this visit.    Social History    Social History   .  Marital Status:  Married     Spouse Name:  N/A   .  Number of Children:  N/A   .  Years of Education:  N/A    Occupational History   .  Not on file.    Social History Main Topics   .  Smoking status:  Never Smoker   .  Smokeless tobacco:  Never Used   .  Alcohol Use:  No   .  Drug Use:  No    .  Sexual Activity:      Partners:  Male     Birth Control/ Protection:  Condom      Comment: pt. given condoms    Other Topics   Concern   .  Not on file    Social History Narrative    Family History   Problem  Relation  Age of Onset   .  Cataracts  Mother    .  Hypertension  Father    .  Colon cancer  Neg Hx     Quinn Axe, MD

## 2016-02-25 NOTE — Discharge Instructions (Signed)
Vulvar Laser, Care After ACTIVITY  Rest as much as possible the first days after discharge.  Arrange to have help from family or others with your daily activities when you go home.  If you feel tired, balance your activity with rest periods.  Follow your caregiver's instruction about climbing stairs and driving a car.  (no restrictions)  Increase activity gradually.  Do not exercise until you have permission from your caregiver.  INUTRITION  You may resume your normal diet.  Drink 6 to 8 glasses of fluids a day.  Eat a healthy, balanced diet including portions of food from the meat (protein), milk, fruit, vegetable, and bread groups.  Your caregiver may recommend you take a multivitamin with iron. ELIMINATION  You may notice that your stream of urine is at a different angle, and may tend to spray. Using a plastic funnel may help to decrease urine spray.  If constipation occurs, drink more liquids, and add more fruits, vegetables, and bran to your diet. You may take a mild laxative, such as Milk of Magnesia, Metamucil, or a stool softener such as Colace, with permission from your caregiver. HYGIENE  You may shower and wash your hair.  Check with your caregiver about tub baths.  Do not add any bath oils or chemicals to your bath water, after you have permission to take baths.  While passing urine, pour water from a bottle or spray over your vulva to dilute the urine as it passes the incision (this will decrease burning and discomfort).  Clean yourself well after moving your bowels.  After urinating, do not wipe. Dap or pat dry with toilet paper or a dry cleath soft cloth.  A sitz bath will help keep your perineal area clean, reduce swelling, and provide comfort.  Avoid wearing underpants for the first 2 weeks and wear loose skirts to allow circulation of air around the incision  Apply silvadine burn cream to the laser skin after each toileting.  HOME CARE INSTRUCTIONS     Apply a soft ice pack (or frozen bag of peas) to your perineum (vulva) every hour in the first 48 hours after surgery. This will reduce swelling.  Avoid activities that involve a lot of friction between your legs.  Avoid wearing pants or underpants in the 1st 2 weeks (skirts are preferable).  Take your temperature twice a day and record it, especially if you feel feverish or have chills.  Follow your caregiver's instructions about medicines, activity, and follow-up appointments after surgery.  Do not drink alcohol while taking pain medicine.  Change your dressing as advised by your caregiver.  You may take over-the-counter medicine for pain, recommended by your caregiver.  If your pain is not relieved with medicine, call your caregiver.  Do not take aspirin because it can cause bleeding.  Do not douche or use tampons (use a nonperfumed sanitary pad).  Do not have sexual intercourse until your caregiver gives you permission (typically 6 weeks postoperatively). Hugging, kissing, and playful sexual activity is fine with your caregiver's permission.  Warm sitz baths, with your caregiver's permission, are helpful to control swelling and discomfort.  Take showers instead of baths, until your caregiver gives you permission to take baths.  You may take a mild medicine for constipation, recommended by your caregiver. Bran foods and drinking a lot of fluids will help with constipation.  Make sure your family understands everything about your operation and recovery. SEEK MEDICAL CARE IF:   You notice swelling and redness around  the wound area.  You notice a foul smell coming from the wound or on the surgical dressing.  You notice the wound is separating.  You have painful or bloody urination.  You develop nausea and vomiting.  You develop diarrhea.  You develop a rash.  You have a reaction or allergy from the medicine.  You feel dizzy or light-headed.  You need stronger  pain medicine. SEEK IMMEDIATE MEDICAL CARE IF:   You develop a temperature of 102 F (38.9 C) or higher.  You pass out.  You develop leg or chest pain.  You develop abdominal pain.  You develop shortness of breath.  You develop bleeding from the wound area.  You see pus in the wound area. MAKE SURE YOU:   Understand these instructions.  Will watch your condition.  Will get help right away if you are not doing well or get worse. Document Released: 06/09/2004 Document Revised: 03/12/2014 Document Reviewed: 09/27/2009 [facility] Patient Information 2015 [place], [place]. This information is not intended to replace advice given to you by your health care provider. Make sure you discuss any questions you have with your health care provider.   Post Anesthesia Home Care Instructions  Activity: Get plenty of rest for the remainder of the day. A responsible adult should stay with you for 24 hours following the procedure.  For the next 24 hours, DO NOT: -Drive a car -Advertising copywriter -Drink alcoholic beverages -Take any medication unless instructed by your physician -Make any legal decisions or sign important papers.  Meals: Start with liquid foods such as gelatin or soup. Progress to regular foods as tolerated. Avoid greasy, spicy, heavy foods. If nausea and/or vomiting occur, drink only clear liquids until the nausea and/or vomiting subsides. Call your physician if vomiting continues.  Special Instructions/Symptoms: Your throat may feel dry or sore from the anesthesia or the breathing tube placed in your throat during surgery. If this causes discomfort, gargle with warm salt water. The discomfort should disappear within 24 hours.  If you had a scopolamine patch placed behind your ear for the management of post- operative nausea and/or vomiting:  1. The medication in the patch is effective for 72 hours, after which it should be removed.  Wrap patch in a tissue and discard in the  trash. Wash hands thoroughly with soap and water. 2. You may remove the patch earlier than 72 hours if you experience unpleasant side effects which may include dry mouth, dizziness or visual disturbances. 3. Avoid touching the patch. Wash your hands with soap and water after contact with the patch.

## 2016-02-25 NOTE — Anesthesia Preprocedure Evaluation (Signed)
Anesthesia Evaluation  Patient identified by MRN, date of birth, ID band Patient awake    Reviewed: Allergy & Precautions, NPO status , Patient's Chart, lab work & pertinent test results  Airway Mallampati: II  TM Distance: >3 FB Neck ROM: Full    Dental no notable dental hx.    Pulmonary neg pulmonary ROS,    Pulmonary exam normal breath sounds clear to auscultation       Cardiovascular Exercise Tolerance: Good hypertension, Pt. on medications Normal cardiovascular exam Rhythm:Regular Rate:Normal     Neuro/Psych negative neurological ROS  negative psych ROS   GI/Hepatic negative GI ROS, Neg liver ROS,   Endo/Other  negative endocrine ROS  Renal/GU negative Renal ROS  negative genitourinary   Musculoskeletal negative musculoskeletal ROS (+)   Abdominal   Peds negative pediatric ROS (+)  Hematology  (+) HIV,   Anesthesia Other Findings HIV positive  Reproductive/Obstetrics negative OB ROS                             Lab Results  Component Value Date   WBC 4.5 12/30/2015   HGB 12.4 12/30/2015   HCT 37.0 12/30/2015   MCV 95.1 12/30/2015   PLT 195 12/30/2015   Lab Results  Component Value Date   CREATININE 1.03 12/30/2015   BUN 10 12/30/2015   NA 136 12/30/2015   K 4.3 12/30/2015   CL 98 12/30/2015   CO2 29 12/30/2015    Anesthesia Physical  Anesthesia Plan  ASA: III  Anesthesia Plan: General   Post-op Pain Management:    Induction: Intravenous  Airway Management Planned: LMA  Additional Equipment:   Intra-op Plan:   Post-operative Plan: Extubation in OR  Informed Consent: I have reviewed the patients History and Physical, chart, labs and discussed the procedure including the risks, benefits and alternatives for the proposed anesthesia with the patient or authorized representative who has indicated his/her understanding and acceptance.   Dental advisory  given  Plan Discussed with: CRNA  Anesthesia Plan Comments:         Anesthesia Quick Evaluation

## 2016-02-25 NOTE — Anesthesia Postprocedure Evaluation (Signed)
Anesthesia Post Note  Patient: Cathy Smith  Procedure(s) Performed: Procedure(s) (LRB): CO2 LASER OF THE VULVAR (N/A)  Patient location during evaluation: PACU Anesthesia Type: General Level of consciousness: awake and alert Pain management: pain level controlled Vital Signs Assessment: post-procedure vital signs reviewed and stable Respiratory status: spontaneous breathing, nonlabored ventilation, respiratory function stable and patient connected to nasal cannula oxygen Cardiovascular status: blood pressure returned to baseline and stable Postop Assessment: no signs of nausea or vomiting Anesthetic complications: no    Last Vitals:  Filed Vitals:   02/25/16 1130 02/25/16 1304  BP: 123/83 121/76  Pulse: 72 78  Temp:  36.6 C  Resp: 19 18    Last Pain:  Filed Vitals:   02/25/16 1305  PainSc: 9                  [name], Tytus Strahle E

## 2016-02-26 ENCOUNTER — Telehealth: Payer: Self-pay | Admitting: *Deleted

## 2016-02-26 ENCOUNTER — Encounter (HOSPITAL_BASED_OUTPATIENT_CLINIC_OR_DEPARTMENT_OTHER): Payer: Self-pay | Admitting: Gynecologic Oncology

## 2016-02-26 NOTE — Telephone Encounter (Signed)
Left message on patient's voicemail with future appointment details. Pt is has a appointment scheduled with Dr. Andrey Farmerossi on  03/16/2016 @ 2:00pm

## 2016-02-28 ENCOUNTER — Other Ambulatory Visit: Payer: Self-pay | Admitting: Gynecologic Oncology

## 2016-02-28 DIAGNOSIS — G8918 Other acute postprocedural pain: Secondary | ICD-10-CM

## 2016-02-28 DIAGNOSIS — D071 Carcinoma in situ of vulva: Secondary | ICD-10-CM

## 2016-02-28 MED ORDER — SILVER SULFADIAZINE 1 % EX CREA: 1.0000 "application " | TOPICAL_CREAM | CUTANEOUS | Status: DC

## 2016-02-28 NOTE — Progress Notes (Signed)
Patient called requesting a refill on silvadene cream.

## 2016-03-16 ENCOUNTER — Ambulatory Visit: Payer: Commercial Managed Care - HMO | Admitting: Gynecologic Oncology

## 2016-04-03 ENCOUNTER — Ambulatory Visit: Payer: Commercial Managed Care - HMO | Attending: Gynecologic Oncology | Admitting: Gynecologic Oncology

## 2016-04-03 ENCOUNTER — Encounter: Payer: Self-pay | Admitting: Gynecologic Oncology

## 2016-04-03 VITALS — BP 120/84 | HR 94 | Temp 98.8°F | Resp 18 | Ht 61.0 in | Wt 147.4 lb

## 2016-04-03 DIAGNOSIS — D072 Carcinoma in situ of vagina: Secondary | ICD-10-CM

## 2016-04-03 DIAGNOSIS — D071 Carcinoma in situ of vulva: Secondary | ICD-10-CM | POA: Diagnosis not present

## 2016-04-03 DIAGNOSIS — Z90710 Acquired absence of both cervix and uterus: Secondary | ICD-10-CM | POA: Insufficient documentation

## 2016-04-03 DIAGNOSIS — Z8249 Family history of ischemic heart disease and other diseases of the circulatory system: Secondary | ICD-10-CM | POA: Diagnosis not present

## 2016-04-03 DIAGNOSIS — B20 Human immunodeficiency virus [HIV] disease: Secondary | ICD-10-CM | POA: Diagnosis not present

## 2016-04-03 DIAGNOSIS — I10 Essential (primary) hypertension: Secondary | ICD-10-CM | POA: Diagnosis not present

## 2016-04-03 DIAGNOSIS — E781 Pure hyperglyceridemia: Secondary | ICD-10-CM | POA: Diagnosis not present

## 2016-04-03 DIAGNOSIS — Z9889 Other specified postprocedural states: Secondary | ICD-10-CM | POA: Insufficient documentation

## 2016-04-03 DIAGNOSIS — Z800 Family history of malignant neoplasm of digestive organs: Secondary | ICD-10-CM | POA: Insufficient documentation

## 2016-04-03 DIAGNOSIS — Z981 Arthrodesis status: Secondary | ICD-10-CM | POA: Diagnosis not present

## 2016-04-03 NOTE — Patient Instructions (Signed)
Follow up in 3 months , call with any changes , questions or concerns.

## 2016-04-03 NOTE — Progress Notes (Signed)
Followup Note: Gyn-Onc   Cathy Smith 50 y.o. female  Chief Complaint  Patient presents with  . 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 S/p CO2 laser of vulva (circumferential, clitoris) on 02/25/15.  Plan:  Follow-up 3 months  Interval History Patient returns today for postoperative vulvar evaluation. She has no complaints.   Her last Pap smear in February 2016 showed ASCUS + hrHPV.   HPI: 50 year old African American female seen in consultation requested of [name], and [name] regarding management of a newly diagnosed severe dysplasia of the upper vagina. Briefly, the patient's history includes having a past history of abdominal hysterectomy for what we understand and have been fibroids. She denies any problems with Pap smears prior to the hysterectomy. More recently she has had some abnormal Pap smears ultimately resulting in colposcopy and directed biopsy on 04/07/2012. 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  The patient did well until March 2015 with a Pap smear showing high-grade dysplasia once again. In April and May 2015 the patient received 2 additional months of Efudex. From June 2015 through February 2016 her Pap smears only showed low-grade SIL.  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 She was scheduled for a wide local excision of the right anterior vulva which was performed on 05/06/15 and revealed VIN3. Margins were positive. Intraoperative evaluation showed lesions consistent with VIN3 on bilateral posterior labia. These were biopsied (but not treated at the patient's request). These biopsies also returned as positive for VIN3.  On 07/23/15 she was taken to the OR for an extensive CO2 laser  ablation of the circumferential labia minora and majora to ablate all acetowhite areas. No pathology was taken. The clitoris was spared.   Her VIN 3 recurred and on 02/25/16 she returned to the OR for CO2 laser which included the anterior vulva and clitoris.   Review of Systems:10 point review of systems is negative as noted above.   Vitals: Blood pressure 120/84, pulse 94, temperature 98.8 F (37.1 C), temperature source Oral, resp. rate 18, height  (1.549 m), weight 147 lb 6.4 oz (66.86 kg), SpO2 100 %.  Physical Exam: General : The patient is a healthy woman in no acute distress.  HEENT: normocephalic, extraoccular movements normal; neck is supple without thyromegally  Lynphnodes: Supraclavicular and inguinal nodes not enlarged  Abdomen: Soft, non-tender, no ascites, no organomegally, no masses, no hernias  Pelvic:  EGBUS: Acetic acid applied (4%). well healed labia majora and minora. Minimal leukoplakia mid right introitus. Resolution of anterior disease and well healed laser site. Pap taken. Vagina: Normal, no lesions  Urethra and Bladder: Normal, non-tender  Cervix: Surgically absent  Uterus: Surgically absent  Bi-manual examination: Non-tender; no adenxal masses or nodularity  Rectal: normal sphincter tone, no masses, no blood  Lower extremities: No edema or varicosities. Normal range of motion      No Known Allergies  Past Medical History  Diagnosis Date  . [facility] ([facility]  . Hypertension   . Hypertriglyceridemia without hypercholesterolemia   . History of condyloma acuminatum   . History of uterine fibroid   . History of vaginal dysplasia     VAIN III  . Wears glasses   .  VIN III (vulvar intraepithelial neoplasia III)   . PONV (postoperative nausea and vomiting)     Past Surgical History  Procedure Laterality Date  . Anterior cervical decomp/discectomy fusion  11-30-2009    C4 -- C6  . I & d left  buttock abscess  04-28-2001  . Colonoscopy  01-28-2015  . Abdominal hysterectomy  1995 approx  . Vulvectomy Right 05/06/2015    Procedure: WIDE LOCAL  EXCISION  OF RIGHT VULVA;  Surgeon: Adolphus BirchwoodEmma Aloma Boch, MD;  [facility] [place];  Service: Gynecology;  Laterality: Right;  . Co2 laser application Bilateral 07/23/2015    Procedure: BILATERAL CO2 LASER ABLATION OF THE VULVA;  Surgeon: Adolphus BirchwoodEmma Caretha Rumbaugh, MD;  [facility] [place];  Service: Gynecology;  Laterality: Bilateral;  . Co2 laser application N/A 02/25/2016    Procedure: CO2 LASER OF THE VULVAR;  Surgeon: Adolphus BirchwoodEmma Sharona Rovner, MD;  [facility] [place];  Service: Gynecology;  Laterality: N/A;    Current Outpatient Prescriptions  Medication Sig Dispense Refill  . darunavir (PREZISTA) 800 MG tablet Take 1 tablet (800 mg total) by mouth daily. (Patient taking differently: Take 800 mg by mouth daily with breakfast. ) 90 tablet 3  . elvitegravir-cobicistat-emtricitabine-tenofovir (GENVOYA) 150-150-200-10 MG TABS tablet Take 1 tablet by mouth daily with breakfast. 90 tablet 3  . Multiple Vitamin (MULTIVITAMIN) tablet Take 1 tablet by mouth daily. Reported on 01/13/2016    . pravastatin (PRAVACHOL) 40 MG tablet Take 1 tablet (40 mg total) by mouth daily. 90 tablet 3  . spironolactone (ALDACTONE) 25 MG tablet Take 1 tablet (25 mg total) by mouth daily. (Patient taking differently: Take 25 mg by mouth every morning. ) 90 tablet 3  . oxyCODONE-acetaminophen (PERCOCET) 10-325 MG tablet Take 1 tablet by mouth every 4 (four) hours as needed for pain. (Patient not taking: Reported on 04/03/2016) 50 tablet 0  . silver sulfADIAZINE (SILVADENE) 1 % cream Apply 1 application topically as directed. After using the bathroom to urinate or have a bowel movement (Patient not taking: Reported on 04/03/2016) 50 g 1   No current facility-administered medications for this visit.    Social History   Social History  . Marital Status: Married     Spouse Name: N/A  . Number of Children: N/A  . Years of Education: N/A   Occupational History  . Not on file.   Social History Main Topics  . Smoking status: Never Smoker   . Smokeless tobacco: Never Used  . Alcohol Use: No  . Drug Use: No  . Sexual Activity:    Partners: Male    Birth Control/ Protection: Condom     Comment: pt. given condoms   Other Topics Concern  . Not on file   Social History Narrative    Family History  Problem Relation Age of Onset  . Cataracts Mother   . Hypertension Father   . Colon cancer Neg Hx       [name], Ziyad Dyar Caroline, MD 04/03/2016, 1:13 PM

## 2016-06-18 ENCOUNTER — Ambulatory Visit: Payer: Commercial Managed Care - HMO

## 2016-06-29 ENCOUNTER — Ambulatory Visit: Payer: Commercial Managed Care - HMO | Attending: Gynecologic Oncology | Admitting: Gynecologic Oncology

## 2016-07-15 ENCOUNTER — Ambulatory Visit: Payer: Commercial Managed Care - HMO | Admitting: Gynecologic Oncology

## 2016-07-17 ENCOUNTER — Ambulatory Visit: Payer: Commercial Managed Care - HMO | Admitting: Gynecologic Oncology

## 2016-07-22 ENCOUNTER — Encounter: Payer: Self-pay | Admitting: Infectious Diseases

## 2016-07-29 ENCOUNTER — Ambulatory Visit: Payer: Commercial Managed Care - HMO | Attending: Gynecologic Oncology | Admitting: Gynecologic Oncology

## 2016-07-29 ENCOUNTER — Encounter: Payer: Self-pay | Admitting: Gynecologic Oncology

## 2016-07-29 DIAGNOSIS — D072 Carcinoma in situ of vagina: Secondary | ICD-10-CM | POA: Diagnosis not present

## 2016-07-29 DIAGNOSIS — B20 Human immunodeficiency virus [HIV] disease: Secondary | ICD-10-CM | POA: Diagnosis not present

## 2016-07-29 DIAGNOSIS — D071 Carcinoma in situ of vulva: Secondary | ICD-10-CM | POA: Diagnosis not present

## 2016-07-29 DIAGNOSIS — Z79899 Other long term (current) drug therapy: Secondary | ICD-10-CM | POA: Insufficient documentation

## 2016-07-29 NOTE — Progress Notes (Signed)
Followup Note: Gyn-Onc   Cathy Smith 50 y.o. female  Chief Complaint  Patient presents with  . 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 S/p CO2 laser of vulva (circumferential, clitoris) on 02/25/15.  Plan:  Follow-up 6 months  Interval History Patient returns today for routine vulvar evaluation. She has no complaints.   Her last Pap smear in February 2017 showed LGSIL + hrHPV.   HPI: 50 year old African American female seen in consultation requested of [name], and [name] regarding management of a newly diagnosed severe dysplasia of the upper vagina. Briefly, the patient's history includes having a past history of abdominal hysterectomy for what we understand and have been fibroids. She denies any problems with Pap smears prior to the hysterectomy. More recently she has had some abnormal Pap smears ultimately resulting in colposcopy and directed biopsy on 04/07/2012. 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  The patient did well until March 2015 with a Pap smear showing high-grade dysplasia once again. In April and May 2015 the patient received 2 additional months of Efudex. From June 2015 through February 2016 her Pap smears only showed low-grade SIL.  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 She was scheduled for a wide local excision of the right anterior vulva which was performed on 05/06/15 and revealed VIN3. Margins were positive. Intraoperative evaluation showed lesions consistent with VIN3 on bilateral posterior labia. These were biopsied (but not treated at the patient's request). These biopsies also returned as positive for VIN3.  On 07/23/15 she was taken to  the OR for an extensive CO2 laser ablation of the circumferential labia minora and majora to ablate all acetowhite areas. No pathology was taken. The clitoris was spared.   Her VIN 3 recurred and on 02/25/16 she returned to the OR for CO2 laser which included the anterior vulva and clitoris.   Review of Systems:10 point review of systems is negative as noted above.   Vitals: Blood pressure 117/84, pulse 75, temperature 98.5 F (36.9 C), temperature source Oral, resp. rate 18, height 5\' 1"  (1.549 m), weight 150 lb 14.4 oz (68.4 kg), SpO2 100 %.  Physical Exam: General : The patient is a healthy woman in no acute distress.  HEENT: normocephalic, extraoccular movements normal; neck is supple without thyromegally  Lynphnodes: Supraclavicular and inguinal nodes not enlarged  Abdomen: Soft, non-tender, no ascites, no organomegally, no masses, no hernias  Pelvic:  EGBUS: Acetic acid applied (4%). well healed labia majora and minora. Minimal leukoplakia mid left labia minora and introitus. Resolution of anterior disease and well healed laser site.  Vagina: Normal, no lesions  Urethra and Bladder: Normal, non-tender  Cervix: Surgically absent  Uterus: Surgically absent  Bi-manual examination: Non-tender; no adenxal masses or nodularity  Rectal: normal sphincter tone, no masses, no blood  Lower extremities: No edema or varicosities. Normal range of motion      No Known Allergies  Past Medical History:  Diagnosis Date  . History of condyloma acuminatum   . History of uterine fibroid   . History of vaginal dysplasia    VAIN III  . [facility] infection ([facility]  . Hypertension   . Hypertriglyceridemia without  hypercholesterolemia   . PONV (postoperative nausea and vomiting)   . VIN III (vulvar intraepithelial neoplasia III)   . Wears glasses     Past Surgical History:  Procedure Laterality Date  . ABDOMINAL HYSTERECTOMY  1995 approx  .  ANTERIOR CERVICAL DECOMP/DISCECTOMY FUSION  11-30-2009   C4 -- C6  . CO2 LASER APPLICATION Bilateral 07/23/2015   Procedure: BILATERAL CO2 LASER ABLATION OF THE VULVA;  Surgeon: [name], MD;  [facility] [place];  Service: Gynecology;  Laterality: Bilateral;  . CO2 LASER APPLICATION N/A 02/25/2016   Procedure: CO2 LASER OF THE VULVAR;  Surgeon: [name], MD;  [facility] [place];  Service: Gynecology;  Laterality: N/A;  . COLONOSCOPY  01-28-2015  . I & D LEFT BUTTOCK ABSCESS  04-28-2001  . VULVECTOMY Right 05/06/2015   Procedure: WIDE LOCAL  EXCISION  OF RIGHT VULVA;  Surgeon: [name], MD;  [facility] [place];  Service: Gynecology;  Laterality: Right;    Current Outpatient Prescriptions  Medication Sig Dispense Refill  . darunavir (PREZISTA) 800 MG tablet Take 1 tablet (800 mg total) by mouth daily. (Patient taking differently: Take 800 mg by mouth daily with breakfast. ) 90 tablet 3  . elvitegravir-cobicistat-emtricitabine-tenofovir (GENVOYA) 150-150-200-10 MG TABS tablet Take 1 tablet by mouth daily with breakfast. 90 tablet 3  . Multiple Vitamin (MULTIVITAMIN) tablet Take 1 tablet by mouth daily. Reported on 01/13/2016    . pravastatin (PRAVACHOL) 40 MG tablet Take 1 tablet (40 mg total) by mouth daily. 90 tablet 3  . spironolactone (ALDACTONE) 25 MG tablet Take 1 tablet (25 mg total) by mouth daily. (Patient taking differently: Take 25 mg by mouth every morning. ) 90 tablet 3  . oxyCODONE-acetaminophen (PERCOCET) 10-325 MG tablet Take 1 tablet by mouth every 4 (four) hours as needed for pain. (Patient not taking: Reported on 07/29/2016) 50 tablet 0   No current facility-administered medications for this visit.     Social History   Social History  . Marital status: Married    Spouse name: N/A  . Number of children: N/A  . Years of education: N/A   Occupational History  . Not on file.   Social History Main Topics  . Smoking  status: Never Smoker  . Smokeless tobacco: Never Used  . Alcohol use No  . Drug use: No  . Sexual activity: Yes    Partners: Male    Birth control/ protection: Condom     Comment: pt. given condoms   Other Topics Concern  . Not on file   Social History Narrative  . No narrative on file    Family History  Problem Relation Age of Onset  . Cataracts Mother   . Hypertension Father   . Colon cancer Neg Hx       [name], Yerik Zeringue Caroline, MD 07/29/2016, 2:10 PM

## 2016-07-29 NOTE — Patient Instructions (Signed)
Plan to follow up in March 2018 or sooner if needed.  Please call our office at [phone] in November or December 2017 to schedule your appointment for March.

## 2016-08-05 ENCOUNTER — Other Ambulatory Visit (HOSPITAL_COMMUNITY)
Admission: RE | Admit: 2016-08-05 | Discharge: 2016-08-05 | Disposition: A | Discharge status: Home / Self Care | Payer: Commercial Managed Care - HMO | Source: Ambulatory Visit | Attending: Infectious Diseases | Admitting: Infectious Diseases

## 2016-08-05 ENCOUNTER — Other Ambulatory Visit: Payer: Commercial Managed Care - HMO

## 2016-08-05 DIAGNOSIS — Z79899 Other long term (current) drug therapy: Secondary | ICD-10-CM

## 2016-08-05 DIAGNOSIS — Z113 Encounter for screening for infections with a predominantly sexual mode of transmission: Secondary | ICD-10-CM

## 2016-08-05 DIAGNOSIS — B20 Human immunodeficiency virus [HIV] disease: Secondary | ICD-10-CM

## 2016-08-05 LAB — COMPLETE METABOLIC PANEL WITH GFR
ALBUMIN: 4.3 g/dL (ref 3.6–5.1)
ALK PHOS: 52 U/L (ref 33–130)
ALT: 26 U/L (ref 6–29)
AST: 23 U/L (ref 10–35)
BILIRUBIN TOTAL: 0.4 mg/dL (ref 0.2–1.2)
BUN: 11 mg/dL (ref 7–25)
CO2: 25 mmol/L (ref 20–31)
CREATININE: 0.92 mg/dL (ref 0.50–1.05)
Calcium: 9.1 mg/dL (ref 8.6–10.4)
Chloride: 105 mmol/L (ref 98–110)
GFR, EST AFRICAN AMERICAN: 83 mL/min (ref >=60)
GFR, EST NON AFRICAN AMERICAN: 72 mL/min (ref >=60)
Glucose, Bld: 113 mg/dL — ABNORMAL HIGH (ref 65–99)
Potassium: 4.1 mmol/L (ref 3.5–5.3)
Sodium: 140 mmol/L (ref 135–146)
TOTAL PROTEIN: 7.2 g/dL (ref 6.1–8.1)

## 2016-08-05 LAB — CBC
HEMATOCRIT: 35.5 % (ref 35.0–45.0)
HEMOGLOBIN: 12.1 g/dL (ref 11.7–15.5)
MCH: 33 pg (ref 27.0–33.0)
MCHC: 34.1 g/dL (ref 32.0–36.0)
MCV: 96.7 fL (ref 80.0–100.0)
MPV: 10.1 fL (ref 7.5–12.5)
Platelets: 184 10*3/uL (ref 140–400)
RBC: 3.67 MIL/uL — AB (ref 3.80–5.10)
RDW: 12.2 % (ref 11.0–15.0)
WBC: 3.1 10*3/uL — AB (ref 3.8–10.8)

## 2016-08-05 LAB — LIPID PANEL
Cholesterol: 158 mg/dL (ref 125–200)
HDL: 42 mg/dL — AB (ref >=46)
LDL Cholesterol: 86 mg/dL (ref <130)
Total CHOL/HDL Ratio: 3.8 Ratio (ref <=5.0)
Triglycerides: 151 mg/dL — ABNORMAL HIGH (ref <150)
VLDL: 30 mg/dL (ref <30)

## 2016-08-06 LAB — HIV-1 RNA QUANT-NO REFLEX-BLD: HIV 1 RNA Quant: <20 copies/mL (ref <20)

## 2016-08-06 LAB — T-HELPER CELL (CD4) - (RCID CLINIC ONLY)
CD4 % Helper T Cell: 18 % — ABNORMAL LOW (ref 33–55)
CD4 T Cell Abs: 280 /uL — ABNORMAL LOW (ref 400–2700)

## 2016-08-06 LAB — RPR

## 2016-08-06 LAB — URINE CYTOLOGY ANCILLARY ONLY
Chlamydia: NEGATIVE
Neisseria Gonorrhea: NEGATIVE

## 2016-08-10 ENCOUNTER — Other Ambulatory Visit: Payer: Self-pay | Admitting: Family Medicine

## 2016-08-10 DIAGNOSIS — E785 Hyperlipidemia, unspecified: Secondary | ICD-10-CM

## 2016-08-19 ENCOUNTER — Ambulatory Visit (INDEPENDENT_AMBULATORY_CARE_PROVIDER_SITE_OTHER): Payer: Commercial Managed Care - HMO | Admitting: Infectious Diseases

## 2016-08-19 ENCOUNTER — Encounter: Payer: Self-pay | Admitting: Infectious Diseases

## 2016-08-19 VITALS — BP 107/72 | HR 84 | Temp 98.5°F | Ht 61.0 in | Wt 150.0 lb

## 2016-08-19 DIAGNOSIS — Z113 Encounter for screening for infections with a predominantly sexual mode of transmission: Secondary | ICD-10-CM | POA: Diagnosis not present

## 2016-08-19 DIAGNOSIS — D071 Carcinoma in situ of vulva: Secondary | ICD-10-CM

## 2016-08-19 DIAGNOSIS — B20 Human immunodeficiency virus [HIV] disease: Secondary | ICD-10-CM | POA: Diagnosis not present

## 2016-08-19 DIAGNOSIS — Z981 Arthrodesis status: Secondary | ICD-10-CM | POA: Diagnosis not present

## 2016-08-19 DIAGNOSIS — Z79899 Other long term (current) drug therapy: Secondary | ICD-10-CM

## 2016-08-19 NOTE — Assessment & Plan Note (Signed)
States her pain is better.

## 2016-08-19 NOTE — Assessment & Plan Note (Addendum)
She is doing well.  Will continue her current art Needs mammogram 12-2016 Has condoms.  Got flu shot today.  Will see her back in 6 months

## 2016-08-19 NOTE — Assessment & Plan Note (Signed)
She has f/u 09-09-16 at 10am.  We discussed this.  She feels that she is doing well.

## 2016-08-19 NOTE — Progress Notes (Signed)
   Subjective:    Patient ID: Altamese DillingRenee C Bernhart, female    DOB: 04/22/1966, 50 y.o.   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  She also has a hx of VIN III/CIS (has been getting laser tx,last 02-2016), anterior cervical decompression (C4-6) with prosthesis and allograft.  Husband is HIV+ and follows with Dr Orvan Falconerampbell.  [facility] Now on genvoya. No problems with ART.  Has been married 5 years on 08-27-16.   HIV 1 RNA Quant (copies/mL)  Date Value  08/05/2016 <20  12/30/2015 <20  08/12/2015 <20   CD4 T Cell Abs (/uL)  Date Value  08/05/2016 280 (L)  12/30/2015 170 (L)  08/12/2015 210 (L)     Review of Systems  Constitutional: Negative for appetite change and unexpected weight change.  Gastrointestinal: Negative for constipation and diarrhea.  Genitourinary: Negative for difficulty urinating.  Musculoskeletal: Negative for back pain.       Objective:   Physical Exam  Constitutional: She appears well-developed and well-nourished.  HENT:  Mouth/Throat: No oropharyngeal exudate.  Eyes: EOM are normal. Pupils are equal, round, and reactive to light.  Neck: Neck supple.  Cardiovascular: Normal rate, regular rhythm and normal heart sounds.   Pulmonary/Chest: Effort normal and breath sounds normal.  Abdominal: Soft. Bowel sounds are normal. There is no tenderness. There is no rebound.  Musculoskeletal: She exhibits no edema.  Lymphadenopathy:    She has no cervical adenopathy.      Assessment & Plan:

## 2016-08-27 ENCOUNTER — Other Ambulatory Visit: Payer: Self-pay | Admitting: *Deleted

## 2016-08-27 ENCOUNTER — Other Ambulatory Visit: Payer: Self-pay | Admitting: Infectious Diseases

## 2016-08-27 DIAGNOSIS — B20 Human immunodeficiency virus [HIV] disease: Secondary | ICD-10-CM

## 2016-08-27 MED ORDER — DARUNAVIR ETHANOLATE 800 MG PO TABS: 800.0000 mg | ORAL_TABLET | Freq: Every day | ORAL | 11 refills | Status: DC

## 2016-08-27 MED ORDER — ELVITEG-COBIC-EMTRICIT-TENOFAF 150-150-200-10 MG PO TABS: 1.0000 | ORAL_TABLET | Freq: Every day | ORAL | 11 refills | Status: DC

## 2016-09-09 ENCOUNTER — Ambulatory Visit: Payer: Commercial Managed Care - HMO | Admitting: Family Medicine

## 2016-10-12 ENCOUNTER — Encounter: Payer: Self-pay | Admitting: Family Medicine

## 2016-10-12 ENCOUNTER — Ambulatory Visit (INDEPENDENT_AMBULATORY_CARE_PROVIDER_SITE_OTHER): Payer: Commercial Managed Care - HMO | Admitting: Family Medicine

## 2016-10-12 VITALS — BP 113/74 | HR 80 | Temp 97.8°F | Resp 12 | Ht 61.0 in | Wt 154.0 lb

## 2016-10-12 DIAGNOSIS — E785 Hyperlipidemia, unspecified: Secondary | ICD-10-CM

## 2016-10-12 DIAGNOSIS — Z131 Encounter for screening for diabetes mellitus: Secondary | ICD-10-CM | POA: Diagnosis not present

## 2016-10-12 LAB — POCT GLYCOSYLATED HEMOGLOBIN (HGB A1C): Hemoglobin A1C: 5.6

## 2016-10-12 MED ORDER — PRAVASTATIN SODIUM 40 MG PO TABS: ORAL_TABLET | ORAL | 0 refills | Status: DC

## 2016-10-12 NOTE — Patient Instructions (Signed)
Continue current treatment. 

## 2016-10-12 NOTE — Progress Notes (Signed)
Cathy PantherRenee Charette, is a 50 y.o. female  AOZ:[id number]:[id number]  NGE:[id number]  DOB - 02/19/1966  CC:  Chief Complaint  Patient presents with  . follow up       HPI: Cathy Smith is a 50 y.o. female here for follow-up hypertension.  She is not taking her Aldactone and her blood pressure is 113/74. She has a history of hyperlipidemia and needs a refill on her pravastatin. She reports doing well. Her HIV is in good control. She is not following a particular dietary plan and does not exercise regularly. She denies tobacco, alcohol, or drug use.  Health Maintenance: Has had influenza. Mammogram and PAP are current. Has had a negative Hep C screen.        No Known Allergies Past Medical History:  Diagnosis Date  . History of condyloma acuminatum   . History of uterine fibroid   . History of vaginal dysplasia    VAIN III  . [facility] infection ([facility]  . Hypertension   . Hypertriglyceridemia without hypercholesterolemia   . PONV (postoperative nausea and vomiting)   . VIN III (vulvar intraepithelial neoplasia III)   . Wears glasses    Current Outpatient Prescriptions on File Prior to Visit  Medication Sig Dispense Refill  . Darunavir Ethanolate (PREZISTA) 800 MG tablet Take 1 tablet (800 mg total) by mouth daily. 30 tablet 11  . elvitegravir-cobicistat-emtricitabine-tenofovir (GENVOYA) 150-150-200-10 MG TABS tablet Take 1 tablet by mouth daily with breakfast. 30 tablet 11  . Multiple Vitamin (MULTIVITAMIN) tablet Take 1 tablet by mouth daily. Reported on 01/13/2016     No current facility-administered medications on file prior to visit.    Family History  Problem Relation Age of Onset  . Cataracts Mother   . Hypertension Father   . Colon cancer Neg Hx    Social History   Social History  . Marital status: Married    Spouse name: N/A  . Number of children: N/A  . Years of education: N/A   Occupational History  . Not on file.   Social  History Main Topics  . Smoking status: Never Smoker  . Smokeless tobacco: Never Used  . Alcohol use No  . Drug use: No  . Sexual activity: Yes    Partners: Male    Birth control/ protection: Condom     Comment: pt. given condoms   Other Topics Concern  . Not on file   Social History Narrative  . No narrative on file    Review of Systems: Constitutional: Negative Skin: Negative HENT: Negative  Eyes: Negative  Neck: Negative Respiratory: Negative Cardiovascular: Negative Gastrointestinal: Negative Genitourinary: Negative  Musculoskeletal: Negative   Neurological: Negative for Hematological: Negative  Psychiatric/Behavioral: Negative    Objective:   Vitals:   10/12/16 1327  BP: 113/74  Pulse: 80  Resp: 12  Temp: 97.8 F (36.6 C)    Physical Exam: Constitutional: Patient appears well-developed and well-nourished. No distress. HENT: Normocephalic, atraumatic, External right and left ear normal. Oropharynx is clear and moist.  Eyes: Conjunctivae and EOM are normal. PERRLA, no scleral icterus. Neck: Normal ROM. Neck supple. No lymphadenopathy, No thyromegaly. CVS: RRR, S1/S2 +, no murmurs, no gallops, no rubs Pulmonary: Effort and breath sounds normal, no stridor, rhonchi, wheezes, rales.  Abdominal: Soft. Normoactive BS,, no distension, tenderness, rebound or guarding.  Musculoskeletal: Normal range of motion. No edema and no tenderness.  Neuro: Alert.Normal muscle tone coordination. Non-focal Skin: Skin is warm and dry.  No rash noted. Not diaphoretic. No erythema. No pallor. Psychiatric: Normal mood and affect. Behavior, judgment, thought content normal.  Lab Results  Component Value Date   WBC 3.1 (L) 08/05/2016   HGB 12.1 08/05/2016   HCT 35.5 08/05/2016   MCV 96.7 08/05/2016   PLT 184 08/05/2016   Lab Results  Component Value Date   CREATININE 0.92 08/05/2016   BUN 11 08/05/2016   NA 140 08/05/2016   K 4.1 08/05/2016   CL 105 08/05/2016   CO2 25  08/05/2016    Lab Results  Component Value Date   HGBA1C 5.6 10/12/2016   Lipid Panel     Component Value Date/Time   CHOL 158 08/05/2016 0923   TRIG 151 (H) 08/05/2016 0923   HDL 42 (L) 08/05/2016 0923   CHOLHDL 3.8 08/05/2016 0923   VLDL 30 08/05/2016 0923   LDLCALC 86 08/05/2016 0923        Assessment and plan:   1. Screening for diabetes mellitus  - POCT glycosylated hemoglobin (Hb A1C)  2. Hyperlipidemia, unspecified hyperlipidemia type  - pravastatin (PRAVACHOL) 40 MG tablet; TAKE 1 TABLET(40 MG) BY MOUTH DAILY  Dispense: 90 tablet; Refill: 0   Return in about 6 months (around 04/12/2017).  The patient was given clear instructions to go to ER or return to medical center if symptoms don't improve, worsen or new problems develop. The patient verbalized understanding.    Henrietta HooverLinda C Bernhardt FNP  10/12/2016, 2:14 PM

## 2016-11-17 ENCOUNTER — Other Ambulatory Visit: Payer: Self-pay | Admitting: Infectious Diseases

## 2016-11-17 ENCOUNTER — Telehealth: Payer: Self-pay | Admitting: *Deleted

## 2016-11-17 DIAGNOSIS — Z1231 Encounter for screening mammogram for malignant neoplasm of breast: Secondary | ICD-10-CM

## 2016-11-17 NOTE — Telephone Encounter (Signed)
Pt called to schedule 6mth f/u with MD. Appt for march 21 @ 1pm given to pt. No further concerns.

## 2016-12-14 ENCOUNTER — Ambulatory Visit
Admission: RE | Admit: 2016-12-14 | Discharge: 2016-12-14 | Disposition: A | Discharge status: Home / Self Care | Payer: Commercial Managed Care - HMO | Source: Ambulatory Visit | Attending: Infectious Diseases | Admitting: Infectious Diseases

## 2016-12-14 DIAGNOSIS — Z1231 Encounter for screening mammogram for malignant neoplasm of breast: Secondary | ICD-10-CM | POA: Diagnosis not present

## 2016-12-16 ENCOUNTER — Telehealth: Payer: Self-pay | Admitting: Gynecologic Oncology

## 2016-12-16 NOTE — Telephone Encounter (Signed)
Spoke with patient about needing to move her appt up to a sooner time.  Appt changed from 1pm to 11:15am since Dr. Andrey Farmerossi will be leaving the office early on that day.  Patient verbalizing understanding.  Advised to call for any needs or concerns.

## 2017-01-11 ENCOUNTER — Other Ambulatory Visit: Payer: Self-pay | Admitting: Family Medicine

## 2017-01-11 DIAGNOSIS — E785 Hyperlipidemia, unspecified: Secondary | ICD-10-CM

## 2017-01-20 DIAGNOSIS — H524 Presbyopia: Secondary | ICD-10-CM | POA: Diagnosis not present

## 2017-01-26 NOTE — Progress Notes (Signed)
Followup Note: Gyn-Onc   Cathy Smith 51 y.o. female  Chief Complaint  Patient presents with  . 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 S/p CO2 laser of vulva (circumferential, clitoris) on 02/25/15.  Plan:  Follow-up today's pap. Exam in 3 months. Will consider surgical intervention at that time if lesions present.  Interval History Patient returns today for routine vulvar evaluation. She has no complaints.   Her last Pap smear in February 2017 showed LGSIL + hrHPV.   HPI: 51 year old African American female seen in consultation requested of [name], and [name] regarding management of a newly diagnosed severe dysplasia of the upper vagina. Briefly, the patient's history includes having a past history of abdominal hysterectomy for what we understand and have been fibroids. She denies any problems with Pap smears prior to the hysterectomy. More recently she has had some abnormal Pap smears ultimately resulting in colposcopy and directed biopsy on 04/07/2012. 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  The patient did well until March 2015 with a Pap smear showing high-grade dysplasia once again. In April and May 2015 the patient received 2 additional months of Efudex. From June 2015 through February 2016 her Pap smears only showed low-grade SIL.  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 She was scheduled for a wide local excision of the right anterior vulva which was performed on 05/06/15 and revealed VIN3. Margins were positive. Intraoperative evaluation showed lesions consistent with VIN3 on bilateral posterior labia. These were biopsied (but not treated at the patient's request). These biopsies also returned as  positive for VIN3.  On 07/23/15 she was taken to the OR for an extensive CO2 laser ablation of the circumferential labia minora and majora to ablate all acetowhite areas. No pathology was taken. The clitoris was spared.   Her VIN 3 recurred and on 02/25/16 she returned to the OR for CO2 laser which included the anterior vulva and clitoris.   Review of Systems:10 point review of systems is negative as noted above.   Vitals: Blood pressure 104/70, pulse 73, temperature 97.5 F (36.4 C), temperature source Oral, resp. rate 16, height 5\' 1"  (1.549 m), weight 153 lb 14.4 oz (69.8 kg), SpO2 100 %.  Physical Exam: General : The patient is a healthy woman in no acute distress.  HEENT: normocephalic, extraoccular movements normal; neck is supple without thyromegally  Lynphnodes: Supraclavicular and inguinal nodes not enlarged  Abdomen: Soft, non-tender, no ascites, no organomegally, no masses, no hernias  Pelvic:  EGBUS: Acetic acid applied (4%). well healed labia majora and minora. Minimal leukoplakia bilateral anterior labia minora and right labia majora.  Vagina: Normal, no lesions. Pap taken Urethra and Bladder: Normal, non-tender  Cervix: Surgically absent  Uterus: Surgically absent  Bi-manual examination: Non-tender; no adenxal masses or nodularity  Rectal: normal sphincter tone, no masses, no blood  Lower extremities: No edema or varicosities. Normal range of motion      No Known Allergies  Past Medical History:  Diagnosis Date  . History of condyloma acuminatum   . History of uterine fibroid   . History of vaginal dysplasia    VAIN III  . [facility] infection ([facility]  . Hypertension   . Hypertriglyceridemia without  hypercholesterolemia   . PONV (postoperative nausea and vomiting)   . VIN III (vulvar intraepithelial neoplasia III)   . Wears glasses     Past Surgical History:  Procedure Laterality Date  . ABDOMINAL HYSTERECTOMY   1995 approx  . ANTERIOR CERVICAL DECOMP/DISCECTOMY FUSION  11-30-2009   C4 -- C6  . CO2 LASER APPLICATION Bilateral 07/23/2015   Procedure: BILATERAL CO2 LASER ABLATION OF THE VULVA;  Surgeon: Adolphus BirchwoodEmma Billey Wojciak, MD;  [facility];  Service: Gynecology;  Laterality: Bilateral;  . CO2 LASER APPLICATION N/A 02/25/2016   Procedure: CO2 LASER OF THE VULVAR;  Surgeon: Adolphus BirchwoodEmma Wai Minotti, MD;  [facility];  Service: Gynecology;  Laterality: N/A;  . COLONOSCOPY  01-28-2015  . I & D LEFT BUTTOCK ABSCESS  04-28-2001  . VULVECTOMY Right 05/06/2015   Procedure: WIDE LOCAL  EXCISION  OF RIGHT VULVA;  Surgeon: Adolphus BirchwoodEmma Dietrich Samuelson, MD;  [facility];  Service: Gynecology;  Laterality: Right;    Current Outpatient Prescriptions  Medication Sig Dispense Refill  . Darunavir Ethanolate (PREZISTA) 800 MG tablet Take 1 tablet (800 mg total) by mouth daily. 30 tablet 11  . elvitegravir-cobicistat-emtricitabine-tenofovir (GENVOYA) 150-150-200-10 MG TABS tablet Take 1 tablet by mouth daily with breakfast. 30 tablet 11  . Multiple Vitamin (MULTIVITAMIN) tablet Take 1 tablet by mouth daily. Reported on 01/13/2016    . pravastatin (PRAVACHOL) 40 MG tablet TAKE 1 TABLET(40 MG) BY MOUTH DAILY 90 tablet 0  . pravastatin (PRAVACHOL) 40 MG tablet TAKE 1 TABLET(40 MG) BY MOUTH DAILY 90 tablet 0   No current facility-administered medications for this visit.     Social History   Social History  . Marital status: Married    Spouse name: N/A  . Number of children: N/A  . Years of education: N/A   Occupational History  . Not on file.   Social History Main Topics  . Smoking status: Never Smoker  . Smokeless tobacco: Never Used  . Alcohol use No  . Drug use: No  . Sexual activity: Yes    Partners: Male    Birth control/ protection: Condom     Comment: pt. given condoms   Other Topics Concern  . Not on file   Social History Narrative  . No narrative on file     Family History  Problem Relation Age of Onset  . Cataracts Mother   . Hypertension Father   . Colon cancer Neg Hx       Quinn Smith, Cathy Smith Caroline, MD 01/27/2017, 1:12 PM

## 2017-01-27 ENCOUNTER — Encounter: Payer: Self-pay | Admitting: Gynecologic Oncology

## 2017-01-27 ENCOUNTER — Other Ambulatory Visit: Payer: Medicare HMO

## 2017-01-27 ENCOUNTER — Ambulatory Visit: Payer: Medicare HMO | Attending: Gynecologic Oncology | Admitting: Gynecologic Oncology

## 2017-01-27 ENCOUNTER — Other Ambulatory Visit (HOSPITAL_COMMUNITY)
Admission: RE | Admit: 2017-01-27 | Discharge: 2017-01-27 | Disposition: A | Discharge status: Home / Self Care | Payer: Medicare HMO | Source: Ambulatory Visit | Attending: Gynecologic Oncology | Admitting: Gynecologic Oncology

## 2017-01-27 VITALS — BP 104/70 | HR 73 | Temp 97.5°F | Resp 16 | Ht 61.0 in | Wt 153.9 lb

## 2017-01-27 DIAGNOSIS — Z981 Arthrodesis status: Secondary | ICD-10-CM | POA: Insufficient documentation

## 2017-01-27 DIAGNOSIS — E781 Pure hyperglyceridemia: Secondary | ICD-10-CM | POA: Diagnosis not present

## 2017-01-27 DIAGNOSIS — B20 Human immunodeficiency virus [HIV] disease: Secondary | ICD-10-CM

## 2017-01-27 DIAGNOSIS — Z113 Encounter for screening for infections with a predominantly sexual mode of transmission: Secondary | ICD-10-CM

## 2017-01-27 DIAGNOSIS — Z90710 Acquired absence of both cervix and uterus: Secondary | ICD-10-CM | POA: Insufficient documentation

## 2017-01-27 DIAGNOSIS — D071 Carcinoma in situ of vulva: Secondary | ICD-10-CM | POA: Diagnosis not present

## 2017-01-27 DIAGNOSIS — D072 Carcinoma in situ of vagina: Secondary | ICD-10-CM

## 2017-01-27 DIAGNOSIS — Z79899 Other long term (current) drug therapy: Secondary | ICD-10-CM | POA: Insufficient documentation

## 2017-01-27 DIAGNOSIS — I10 Essential (primary) hypertension: Secondary | ICD-10-CM | POA: Diagnosis not present

## 2017-01-27 DIAGNOSIS — Z8249 Family history of ischemic heart disease and other diseases of the circulatory system: Secondary | ICD-10-CM | POA: Insufficient documentation

## 2017-01-27 DIAGNOSIS — R87612 Low grade squamous intraepithelial lesion on cytologic smear of cervix (LGSIL): Secondary | ICD-10-CM | POA: Diagnosis not present

## 2017-01-27 LAB — CBC
HEMATOCRIT: 34.9 % — AB (ref 35.0–45.0)
HEMOGLOBIN: 11.9 g/dL (ref 11.7–15.5)
MCH: 32.6 pg (ref 27.0–33.0)
MCHC: 34.1 g/dL (ref 32.0–36.0)
MCV: 95.6 fL (ref 80.0–100.0)
MPV: 10.1 fL (ref 7.5–12.5)
Platelets: 212 10*3/uL (ref 140–400)
RBC: 3.65 MIL/uL — AB (ref 3.80–5.10)
RDW: 12.9 % (ref 11.0–15.0)
WBC: 3.5 10*3/uL — ABNORMAL LOW (ref 3.8–10.8)

## 2017-01-27 LAB — COMPREHENSIVE METABOLIC PANEL
ALBUMIN: 4.3 g/dL (ref 3.6–5.1)
ALT: 24 U/L (ref 6–29)
AST: 20 U/L (ref 10–35)
Alkaline Phosphatase: 59 U/L (ref 33–130)
BUN: 12 mg/dL (ref 7–25)
CALCIUM: 9.1 mg/dL (ref 8.6–10.4)
CO2: 25 mmol/L (ref 20–31)
Chloride: 106 mmol/L (ref 98–110)
Creat: 0.97 mg/dL (ref 0.50–1.05)
Glucose, Bld: 94 mg/dL (ref 65–99)
Potassium: 4.7 mmol/L (ref 3.5–5.3)
Sodium: 139 mmol/L (ref 135–146)
Total Bilirubin: 0.5 mg/dL (ref 0.2–1.2)
Total Protein: 7.5 g/dL (ref 6.1–8.1)

## 2017-01-27 LAB — LIPID PANEL
CHOL/HDL RATIO: 4.9 ratio (ref <5.0)
CHOLESTEROL: 176 mg/dL (ref <200)
HDL: 36 mg/dL — ABNORMAL LOW (ref >50)
LDL Cholesterol: 87 mg/dL (ref <100)
TRIGLYCERIDES: 267 mg/dL — AB (ref <150)
VLDL: 53 mg/dL — AB (ref <30)

## 2017-01-28 LAB — T-HELPER CELL (CD4) - (RCID CLINIC ONLY)
CD4 % Helper T Cell: 18 % — ABNORMAL LOW (ref 33–55)
CD4 T CELL ABS: 280 /uL — AB (ref 400–2700)

## 2017-01-28 LAB — RPR

## 2017-01-28 LAB — URINE CYTOLOGY ANCILLARY ONLY
Chlamydia: NEGATIVE
NEISSERIA GONORRHEA: NEGATIVE

## 2017-01-29 LAB — HIV-1 RNA QUANT-NO REFLEX-BLD
HIV 1 RNA Quant: <20 copies/mL
HIV-1 RNA Quant, Log: <1.3 Log copies/mL

## 2017-01-31 LAB — CYTOLOGY - PAP: HPV: DETECTED — AB

## 2017-02-10 ENCOUNTER — Telehealth: Payer: Self-pay

## 2017-02-10 NOTE — Telephone Encounter (Signed)
Pt notified of pap result of LGSIL, pt has follow up appointments in June 2018.

## 2017-02-15 ENCOUNTER — Ambulatory Visit: Payer: Commercial Managed Care - HMO | Admitting: Infectious Diseases

## 2017-02-16 ENCOUNTER — Encounter: Payer: Self-pay | Admitting: Infectious Diseases

## 2017-03-15 ENCOUNTER — Other Ambulatory Visit: Payer: Self-pay | Admitting: Family Medicine

## 2017-03-22 ENCOUNTER — Ambulatory Visit: Payer: Medicare HMO | Admitting: Family Medicine

## 2017-03-29 ENCOUNTER — Other Ambulatory Visit: Payer: Self-pay | Admitting: Family Medicine

## 2017-03-29 NOTE — Progress Notes (Signed)
Authorized 10 tablet in order for patient to schedule an office visit.

## 2017-03-31 ENCOUNTER — Ambulatory Visit: Payer: Medicare HMO | Admitting: Infectious Diseases

## 2017-04-13 ENCOUNTER — Encounter: Payer: Self-pay | Admitting: Family Medicine

## 2017-04-13 ENCOUNTER — Ambulatory Visit (INDEPENDENT_AMBULATORY_CARE_PROVIDER_SITE_OTHER): Payer: Medicare HMO | Admitting: Family Medicine

## 2017-04-13 VITALS — BP 126/87 | HR 73 | Temp 97.3°F | Resp 16 | Ht 61.0 in | Wt 156.0 lb

## 2017-04-13 DIAGNOSIS — R5383 Other fatigue: Secondary | ICD-10-CM

## 2017-04-13 DIAGNOSIS — I10 Essential (primary) hypertension: Secondary | ICD-10-CM | POA: Diagnosis not present

## 2017-04-13 DIAGNOSIS — E785 Hyperlipidemia, unspecified: Secondary | ICD-10-CM

## 2017-04-13 DIAGNOSIS — Z131 Encounter for screening for diabetes mellitus: Secondary | ICD-10-CM | POA: Diagnosis not present

## 2017-04-13 LAB — CBC WITH DIFFERENTIAL/PLATELET
BASOS ABS: 34 {cells}/uL (ref 0–200)
Basophils Relative: 1 %
EOS ABS: 34 {cells}/uL (ref 15–500)
EOS PCT: 1 %
HCT: 37.7 % (ref 35.0–45.0)
HEMOGLOBIN: 12.7 g/dL (ref 11.7–15.5)
LYMPHS ABS: 1496 {cells}/uL (ref 850–3900)
Lymphocytes Relative: 44 %
MCH: 32.4 pg (ref 27.0–33.0)
MCHC: 33.7 g/dL (ref 32.0–36.0)
MCV: 96.2 fL (ref 80.0–100.0)
MPV: 10.1 fL (ref 7.5–12.5)
Monocytes Absolute: 408 cells/uL (ref 200–950)
Monocytes Relative: 12 %
NEUTROS PCT: 42 %
Neutro Abs: 1428 cells/uL — ABNORMAL LOW (ref 1500–7800)
Platelets: 190 10*3/uL (ref 140–400)
RBC: 3.92 MIL/uL (ref 3.80–5.10)
RDW: 12.9 % (ref 11.0–15.0)
WBC: 3.4 10*3/uL — ABNORMAL LOW (ref 3.8–10.8)

## 2017-04-13 LAB — POCT URINALYSIS DIP (DEVICE)
Bilirubin Urine: NEGATIVE
GLUCOSE, UA: NEGATIVE mg/dL
Hgb urine dipstick: NEGATIVE
KETONES UR: NEGATIVE mg/dL
Leukocytes, UA: NEGATIVE
NITRITE: NEGATIVE
PH: 5.5 (ref 5.0–8.0)
PROTEIN: NEGATIVE mg/dL
Specific Gravity, Urine: 1.02 (ref 1.005–1.030)
UROBILINOGEN UA: 0.2 mg/dL (ref 0.0–1.0)

## 2017-04-13 MED ORDER — SPIRONOLACTONE 25 MG PO TABS: ORAL_TABLET | ORAL | 2 refills | Status: DC

## 2017-04-13 NOTE — Progress Notes (Signed)
Patient ID: Cathy Smith, female    DOB: [birth year], 51 y.o.   MRN: [id number]  PCP: Bing Neighbors, FNP  Chief Complaint  Patient presents with  . Follow-up    6 MONTH  . Fatigue    Subjective:  HPI Cathy Smith is a 51 y.o. female presents for evaluation of hypertension and fatigue.  Medical problems include: Essential hypertension, HIV, dyslipidemia, vulvar intraepithelial neoplasm  Hypertension Controlled on current medication regimen. Reports no routine home monitoring of blood pressure. Tries to avoid salt. Occasionally eats late and eats out. No routine physical activity. Previously  exercised 3 times per day and stopped several months ago.Reports chronic fatigue. Denies any associated headaches, shortness of breath, or dizziness.  Reports no additional complaints today. Medications reviewed.  Denies any  Social History   Social History  . Marital status: Married    Spouse name: N/A  . Number of children: N/A  . Years of education: N/A   Occupational History  . Not on file.   Social History Main Topics  . Smoking status: Never Smoker  . Smokeless tobacco: Never Used  . Alcohol use No  . Drug use: No  . Sexual activity: Yes    Partners: Male    Birth control/ protection: Condom     Comment: pt. given condoms   Other Topics Concern  . Not on file   Social History Narrative  . No narrative on file    Family History  Problem Relation Age of Onset  . Cataracts Mother   . Hypertension Father   . Colon cancer Neg Hx    Review of Systems See HPI Patient Active Problem List   Diagnosis Date Noted  . Sinusitis 12/30/2015  . History of fusion of cervical spine 08/27/2015  . 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 Physical exam, annual 09/06/2014  . Need for Tdap vaccination 09/06/2014  . Alopecia of scalp 09/06/2014  . Urinary tract infection symptoms 08/15/2014  . Hypertriglyceridemia without hypercholesterolemia  08/08/2014  . Other and unspecified hyperlipidemia 01/23/2013  . Dysuria 07/13/2012  . Abnormal cervical Papanicolaou smear 10/22/2011  . Essential hypertension 06/12/2009  . MONILIASIS, ORAL 09/24/2008  . HX, PERSONAL, PAST NONCOMPLIANCE 11/24/2006  . APHTHOUS ULCERS 08/18/2006  . Human immunodeficiency virus (HIV) disease (HCC) 09/15/2000  . HYSTERECTOMY, HX OF 11/10/1983    No Known Allergies  Prior to Admission medications   Medication Sig Start Date End Date Taking? Authorizing Provider  Darunavir Ethanolate (PREZISTA) 800 MG tablet Take 1 tablet (800 mg total) by mouth daily. 08/27/16  Yes Ginnie Smart, MD  elvitegravir-cobicistat-emtricitabine-tenofovir (GENVOYA) 150-150-200-10 MG TABS tablet Take 1 tablet by mouth daily with breakfast. 08/27/16  Yes Ginnie Smart, MD  Multiple Vitamin (MULTIVITAMIN) tablet Take 1 tablet by mouth daily. Reported on 01/13/2016   Yes [provider]  pravastatin (PRAVACHOL) 40 MG tablet TAKE 1 TABLET(40 MG) BY MOUTH DAILY 10/12/16  Yes Henrietta Hoover, NP  spironolactone (ALDACTONE) 25 MG tablet TAKE 1 TABLET BY MOUTH DAILY. MUST MAKE APPT ASAP. 03/15/17  Yes Bing Neighbors, FNP  pravastatin (PRAVACHOL) 40 MG tablet TAKE 1 TABLET(40 MG) BY MOUTH DAILY Patient not taking: Reported on 04/13/2017 01/11/17   Henrietta Hoover, NP    Past Medical, Surgical Family and Social History reviewed and updated.    Objective:   Today's Vitals   04/13/17 1032  BP: 126/87  Pulse: 73  Resp: 16  Temp: 97.3 F (36.3 C)  TempSrc: Oral  SpO2: 99%  Weight: 156 lb (70.8 kg)  Height: 5\' 1"  (1.549 m)    Wt Readings from Last 3 Encounters:  04/13/17 156 lb (70.8 kg)  01/27/17 153 lb 14.4 oz (69.8 kg)  10/12/16 154 lb (69.9 kg)   Physical Exam  Constitutional: She is oriented to person, place, and time. She appears well-developed and well-nourished.  HENT:  Head: Normocephalic and atraumatic.  Eyes: Conjunctivae are normal. Pupils are  equal, round, and reactive to light.  Neck: Normal range of motion. Neck supple.  Cardiovascular: Normal rate, regular rhythm, normal heart sounds and intact distal pulses.   Pulmonary/Chest: Effort normal and breath sounds normal.  Abdominal: Soft. Bowel sounds are normal.  Musculoskeletal: Normal range of motion.  Neurological: She is alert and oriented to person, place, and time.  Skin: Skin is warm and dry.  Psychiatric: She has a normal mood and affect. Her behavior is normal. Judgment and thought content normal.   Assessment & Plan:  1. Screening for diabetes mellitus - Hemoglobin A1C-5.7  2. Essential hypertension, stable and controlled - COMPLETE METABOLIC PANEL WITH GFR -Spirolactone 25 mg daily   3. Fatigue, unspecified type - Thyroid Panel With TSH - CBC with Differential  4. Dyslipidemia -Pravastatin 40 mg tablet   RTC: 6 months routine chronic disease management  Godfrey PickKimberly S. Tiburcio PeaHarris, MSN, FNP-C [facility]  [address] [place], [place] [address] [phone]

## 2017-04-13 NOTE — Patient Instructions (Addendum)
To improve symptoms of fatigue, start walking at least 15-20 minutes, 5 days per week to improve energy.  I'll check your labs and advise of any abnormal values.     Continue all medication as prescribed.  If you desire to take a vitamin, you may purchase an over the counter Women's  Complete Multi-vitamin.    DASH Eating Plan DASH stands for "Dietary Approaches to Stop Hypertension." The DASH eating plan is a healthy eating plan that has been shown to reduce high blood pressure (hypertension). It may also reduce your risk for type 2 diabetes, heart disease, and stroke. The DASH eating plan may also help with weight loss. What are tips for following this plan? General guidelines  Avoid eating more than 2,300 mg (milligrams) of salt (sodium) a day. If you have hypertension, you may need to reduce your sodium intake to 1,500 mg a day.  Limit alcohol intake to no more than 1 drink a day for nonpregnant women and 2 drinks a day for men. One drink equals 12 oz of beer, 5 oz of wine, or 1 oz of hard liquor.  Work with your health care provider to maintain a healthy body weight or to lose weight. Ask what an ideal weight is for you.  Get at least 30 minutes of exercise that causes your heart to beat faster (aerobic exercise) most days of the week. Activities may include walking, swimming, or biking.  Work with your health care provider or diet and nutrition specialist (dietitian) to adjust your eating plan to your individual calorie needs. Reading food labels  Check food labels for the amount of sodium per serving. Choose foods with less than 5 percent of the Daily Value of sodium. Generally, foods with less than 300 mg of sodium per serving fit into this eating plan.  To find whole grains, look for the word "whole" as the first word in the ingredient list. Shopping  Buy products labeled as "low-sodium" or "no salt added."  Buy fresh foods. Avoid canned foods and premade or frozen  meals. Cooking  Avoid adding salt when cooking. Use salt-free seasonings or herbs instead of table salt or sea salt. Check with your health care provider or pharmacist before using salt substitutes.  Do not fry foods. Cook foods using healthy methods such as baking, boiling, grilling, and broiling instead.  Cook with heart-healthy oils, such as olive, canola, soybean, or sunflower oil. Meal planning   Eat a balanced diet that includes: ? 5 or more servings of fruits and vegetables each day. At each meal, try to fill half of your plate with fruits and vegetables. ? Up to 6-8 servings of whole grains each day. ? Less than 6 oz of lean meat, poultry, or fish each day. A 3-oz serving of meat is about the same size as a deck of cards. One egg equals 1 oz. ? 2 servings of low-fat dairy each day. ? A serving of nuts, seeds, or beans 5 times each week. ? Heart-healthy fats. Healthy fats called Omega-3 fatty acids are found in foods such as flaxseeds and coldwater fish, like sardines, salmon, and mackerel.  Limit how much you eat of the following: ? Canned or prepackaged foods. ? Food that is high in trans fat, such as fried foods. ? Food that is high in saturated fat, such as fatty meat. ? Sweets, desserts, sugary drinks, and other foods with added sugar. ? Full-fat dairy products.  Do not salt foods before eating.  Try  to eat at least 2 vegetarian meals each week.  Eat more home-cooked food and less restaurant, buffet, and fast food.  When eating at a restaurant, ask that your food be prepared with less salt or no salt, if possible. What foods are recommended? The items listed may not be a complete list. Talk with your dietitian about what dietary choices are best for you. Grains Whole-grain or whole-wheat bread. Whole-grain or whole-wheat pasta. Brown rice. [place]. Bulgur. Whole-grain and low-sodium cereals. Pita bread. Low-fat, low-sodium crackers. Whole-wheat flour  tortillas. Vegetables Fresh or frozen vegetables (raw, steamed, roasted, or grilled). Low-sodium or reduced-sodium tomato and vegetable juice. Low-sodium or reduced-sodium tomato sauce and tomato paste. Low-sodium or reduced-sodium canned vegetables. Fruits All fresh, dried, or frozen fruit. Canned fruit in natural juice (without added sugar). Meat and other protein foods Skinless chicken or [place]. Ground chicken or [place]. Pork with fat trimmed off. Fish and seafood. Egg whites. Dried beans, peas, or lentils. Unsalted nuts, nut butters, and seeds. Unsalted canned beans. Lean cuts of beef with fat trimmed off. Low-sodium, lean deli meat. Dairy Low-fat (1%) or fat-free (skim) milk. Fat-free, low-fat, or reduced-fat cheeses. Nonfat, low-sodium ricotta or cottage cheese. Low-fat or nonfat yogurt. Low-fat, low-sodium cheese. Fats and oils Soft margarine without trans fats. Vegetable oil. Low-fat, reduced-fat, or light mayonnaise and salad dressings (reduced-sodium). Canola, safflower, olive, soybean, and sunflower oils. Avocado. Seasoning and other foods Herbs. Spices. Seasoning mixes without salt. Unsalted popcorn and pretzels. Fat-free sweets. What foods are not recommended? The items listed may not be a complete list. Talk with your dietitian about what dietary choices are best for you. Grains Baked goods made with fat, such as croissants, muffins, or some breads. Dry pasta or rice meal packs. Vegetables Creamed or fried vegetables. Vegetables in a cheese sauce. Regular canned vegetables (not low-sodium or reduced-sodium). Regular canned tomato sauce and paste (not low-sodium or reduced-sodium). Regular tomato and vegetable juice (not low-sodium or reduced-sodium). Angie Fava. Olives. Fruits Canned fruit in a light or heavy syrup. Fried fruit. Fruit in cream or butter sauce. Meat and other protein foods Fatty cuts of meat. Ribs. Fried meat. Berniece Salines. Sausage. Bologna and other processed lunch meats.  Salami. Fatback. Hotdogs. Bratwurst. Salted nuts and seeds. Canned beans with added salt. Canned or smoked fish. Whole eggs or egg yolks. Chicken or [place] with skin. Dairy Whole or 2% milk, cream, and half-and-half. Whole or full-fat cream cheese. Whole-fat or sweetened yogurt. Full-fat cheese. Nondairy creamers. Whipped toppings. Processed cheese and cheese spreads. Fats and oils Butter. Stick margarine. Lard. Shortening. Ghee. Bacon fat. Tropical oils, such as coconut, palm kernel, or palm oil. Seasoning and other foods Salted popcorn and pretzels. Onion salt, garlic salt, seasoned salt, table salt, and sea salt. Worcestershire sauce. Tartar sauce. Barbecue sauce. Teriyaki sauce. Soy sauce, including reduced-sodium. Steak sauce. Canned and packaged gravies. Fish sauce. Oyster sauce. Cocktail sauce. Horseradish that you find on the shelf. Ketchup. Mustard. Meat flavorings and tenderizers. Bouillon cubes. Hot sauce and Tabasco sauce. Premade or packaged marinades. Premade or packaged taco seasonings. Relishes. Regular salad dressings. Where to find more information:  National Heart, Lung, and [place]: https://wilson-eaton.com/  American Heart Association: www.heart.org Summary  The DASH eating plan is a healthy eating plan that has been shown to reduce high blood pressure (hypertension). It may also reduce your risk for type 2 diabetes, heart disease, and stroke.  With the DASH eating plan, you should limit salt (sodium) intake to 2,300 mg a day. If you  have hypertension, you may need to reduce your sodium intake to 1,500 mg a day.  When on the DASH eating plan, aim to eat more fresh fruits and vegetables, whole grains, lean proteins, low-fat dairy, and heart-healthy fats.  Work with your health care provider or diet and nutrition specialist (dietitian) to adjust your eating plan to your individual calorie needs. This information is not intended to replace advice given to you by your health  care provider. Make sure you discuss any questions you have with your health care provider. Document Released: 10/15/2011 Document Revised: 10/19/2016 Document Reviewed: 10/19/2016 Elsevier Interactive Patient Education  2017 Elsevier Inc.  Fatigue Fatigue is feeling tired all of the time, a lack of energy, or a lack of motivation. Occasional or mild fatigue is often a normal response to activity or life in general. However, long-lasting (chronic) or extreme fatigue may indicate an underlying medical condition. Follow these instructions at home: Watch your fatigue for any changes. The following actions may help to lessen any discomfort you are feeling:  Talk to your health care provider about how much sleep you need each night. Try to get the required amount every night.  Take medicines only as directed by your health care provider.  Eat a healthy and nutritious diet. Ask your health care provider if you need help changing your diet.  Drink enough fluid to keep your urine clear or pale yellow.  Practice ways of relaxing, such as yoga, meditation, massage therapy, or acupuncture.  Exercise regularly.  Change situations that cause you stress. Try to keep your work and personal routine reasonable.  Do not abuse illegal drugs.  Limit alcohol intake to no more than 1 drink per day for nonpregnant women and 2 drinks per day for men. One drink equals 12 ounces of beer, 5 ounces of wine, or 1 ounces of hard liquor.  Take a multivitamin, if directed by your health care provider.  Contact a health care provider if:  Your fatigue does not get better.  You have a fever.  You have unintentional weight loss or gain.  You have headaches.  You have difficulty: ? Falling asleep. ? Sleeping throughout the night.  You feel angry, guilty, anxious, or sad.  You are unable to have a bowel movement (constipation).  You skin is dry.  Your legs or another part of your body is swollen. Get  help right away if:  You feel confused.  Your vision is blurry.  You feel faint or pass out.  You have a severe headache.  You have severe abdominal, pelvic, or back pain.  You have chest pain, shortness of breath, or an irregular or fast heartbeat.  You are unable to urinate or you urinate less than normal.  You develop abnormal bleeding, such as bleeding from the rectum, vagina, nose, lungs, or nipples.  You vomit blood.  You have thoughts about harming yourself or committing suicide.  You are worried that you might harm someone else. This information is not intended to replace advice given to you by your health care provider. Make sure you discuss any questions you have with your health care provider. Document Released: 08/23/2007 Document Revised: 04/02/2016 Document Reviewed: 02/27/2014 Elsevier Interactive Patient Education  [employer]2018 Else[employer] Inc.

## 2017-04-14 LAB — THYROID PANEL WITH TSH
Free Thyroxine Index: 2 (ref 1.4–3.8)
T3 Uptake: 30 % (ref 22–35)
T4, Total: 6.8 ug/dL (ref 4.5–12.0)
TSH: 1.21 mIU/L

## 2017-04-14 LAB — HEMOGLOBIN A1C
Hgb A1c MFr Bld: 5.7 % — ABNORMAL HIGH (ref <5.7)
Mean Plasma Glucose: 117 mg/dL

## 2017-04-14 LAB — COMPLETE METABOLIC PANEL WITH GFR
ALK PHOS: 59 U/L (ref 33–130)
ALT: 34 U/L — ABNORMAL HIGH (ref 6–29)
AST: 37 U/L — AB (ref 10–35)
Albumin: 4.5 g/dL (ref 3.6–5.1)
BILIRUBIN TOTAL: 0.4 mg/dL (ref 0.2–1.2)
BUN: 13 mg/dL (ref 7–25)
CO2: 19 mmol/L — AB (ref 20–31)
Calcium: 9.4 mg/dL (ref 8.6–10.4)
Chloride: 103 mmol/L (ref 98–110)
Creat: 0.96 mg/dL (ref 0.50–1.05)
GFR, Est African American: 78 mL/min (ref >=60)
GFR, Est Non African American: 68 mL/min (ref >=60)
GLUCOSE: 109 mg/dL — AB (ref 65–99)
POTASSIUM: 4.2 mmol/L (ref 3.5–5.3)
SODIUM: 137 mmol/L (ref 135–146)
TOTAL PROTEIN: 7.9 g/dL (ref 6.1–8.1)

## 2017-04-21 ENCOUNTER — Ambulatory Visit (INDEPENDENT_AMBULATORY_CARE_PROVIDER_SITE_OTHER): Payer: Medicare HMO | Admitting: Infectious Diseases

## 2017-04-21 ENCOUNTER — Encounter: Payer: Self-pay | Admitting: Infectious Diseases

## 2017-04-21 VITALS — BP 105/83 | HR 89 | Temp 97.7°F | Wt 154.0 lb

## 2017-04-21 DIAGNOSIS — I10 Essential (primary) hypertension: Secondary | ICD-10-CM

## 2017-04-21 DIAGNOSIS — B20 Human immunodeficiency virus [HIV] disease: Secondary | ICD-10-CM | POA: Diagnosis not present

## 2017-04-21 DIAGNOSIS — Z79899 Other long term (current) drug therapy: Secondary | ICD-10-CM

## 2017-04-21 DIAGNOSIS — Z113 Encounter for screening for infections with a predominantly sexual mode of transmission: Secondary | ICD-10-CM

## 2017-04-21 DIAGNOSIS — D071 Carcinoma in situ of vulva: Secondary | ICD-10-CM

## 2017-04-21 DIAGNOSIS — Z23 Encounter for immunization: Secondary | ICD-10-CM | POA: Diagnosis not present

## 2017-04-21 NOTE — Assessment & Plan Note (Signed)
Greatly appreciate GYN f/u.  

## 2017-04-21 NOTE — Assessment & Plan Note (Signed)
She is doing well on single agent.  Would query if she could stop, defer to PCP

## 2017-04-21 NOTE — Addendum Note (Signed)
Addended by: Lurlean LeydenPOOLE, Coralee Edberg F on: 04/21/2017 04:09 PM   Modules accepted: Orders

## 2017-04-21 NOTE — Progress Notes (Signed)
   Subjective:    Patient ID: Cathy Smith, female    DOB: 09/18/1966, 51 y.o.   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  Married since 2012, husband is HIV+ and follows with Dr Orvan Falconerampbell.  [facility] genvoya-DRV. No problems with these.  Saw PCP recently- was dx with "fatigue", encouraged to walk daily, watch diet.  Next GYN f/u is 6-20.  NL mammo 12-2016.   HIV 1 RNA Quant (copies/mL)  Date Value  01/27/2017 <20 NOT DETECTED  08/05/2016 <20  12/30/2015 <20   CD4 T Cell Abs (/uL)  Date Value  01/27/2017 280 (L)  08/05/2016 280 (L)  12/30/2015 170 (L)    Review of Systems  Constitutional: Negative for appetite change and unexpected weight change.  Respiratory: Negative for shortness of breath.   Cardiovascular: Positive for chest pain.  Gastrointestinal: Negative for constipation and diarrhea.  Genitourinary: Negative for difficulty urinating.  Neurological: Positive for headaches.  can walk 45 minutes, without stopping. Gets CP when gardening, gets occas numbness in R hand with this. No nausea or SOB with CP.      Objective:   Physical Exam  Constitutional: She appears well-developed and well-nourished.  HENT:  Mouth/Throat: No oropharyngeal exudate.  Eyes: EOM are normal. Pupils are equal, round, and reactive to light.  Neck: Neck supple.  Cardiovascular: Normal rate, regular rhythm and normal heart sounds.   Pulmonary/Chest: Effort normal and breath sounds normal.  Abdominal: Soft. Bowel sounds are normal. There is no tenderness. There is no rebound.  Musculoskeletal: She exhibits no edema.  Lymphadenopathy:    She has no cervical adenopathy.  Psychiatric: She has a normal mood and affect.      Assessment & Plan:

## 2017-04-21 NOTE — Assessment & Plan Note (Addendum)
She is doing very well given condoms.  Mammo, colon up to date.  Mening vax rtc in 9 months.

## 2017-04-26 ENCOUNTER — Telehealth: Payer: Self-pay

## 2017-04-26 NOTE — Telephone Encounter (Signed)
Pt lvm stating she is returning a call.  lvm that she has an appt 6/20 at 115 pm with Dr Andrey Farmerossi

## 2017-04-28 ENCOUNTER — Ambulatory Visit: Payer: Medicare HMO | Attending: Gynecologic Oncology | Admitting: Gynecologic Oncology

## 2017-04-28 ENCOUNTER — Encounter: Payer: Self-pay | Admitting: Gynecologic Oncology

## 2017-04-28 VITALS — BP 120/82 | HR 95 | Temp 98.2°F | Resp 20 | Wt 149.0 lb

## 2017-04-28 DIAGNOSIS — I10 Essential (primary) hypertension: Secondary | ICD-10-CM | POA: Diagnosis not present

## 2017-04-28 DIAGNOSIS — E781 Pure hyperglyceridemia: Secondary | ICD-10-CM | POA: Diagnosis not present

## 2017-04-28 DIAGNOSIS — D071 Carcinoma in situ of vulva: Secondary | ICD-10-CM | POA: Diagnosis not present

## 2017-04-28 DIAGNOSIS — R87622 Low grade squamous intraepithelial lesion on cytologic smear of vagina (LGSIL): Secondary | ICD-10-CM | POA: Diagnosis not present

## 2017-04-28 DIAGNOSIS — Z8249 Family history of ischemic heart disease and other diseases of the circulatory system: Secondary | ICD-10-CM | POA: Diagnosis not present

## 2017-04-28 DIAGNOSIS — Z800 Family history of malignant neoplasm of digestive organs: Secondary | ICD-10-CM | POA: Insufficient documentation

## 2017-04-28 DIAGNOSIS — B977 Papillomavirus as the cause of diseases classified elsewhere: Secondary | ICD-10-CM

## 2017-04-28 DIAGNOSIS — Z9889 Other specified postprocedural states: Secondary | ICD-10-CM | POA: Diagnosis not present

## 2017-04-28 DIAGNOSIS — Z79899 Other long term (current) drug therapy: Secondary | ICD-10-CM | POA: Insufficient documentation

## 2017-04-28 DIAGNOSIS — D072 Carcinoma in situ of vagina: Secondary | ICD-10-CM | POA: Diagnosis not present

## 2017-04-28 DIAGNOSIS — N890 Mild vaginal dysplasia: Secondary | ICD-10-CM | POA: Diagnosis not present

## 2017-04-28 DIAGNOSIS — Z90710 Acquired absence of both cervix and uterus: Secondary | ICD-10-CM | POA: Diagnosis not present

## 2017-04-28 NOTE — Patient Instructions (Addendum)
Plan to have a wide local excision of the vulva and laser of the vulva on June 26 at 2pm.  You will receive a phone call from the pre-surgical RN to discuss instructions prior to your procedure.  Please call for any questions or concerns.   Vulvectomy, Care After Refer to this sheet in the next few weeks. These instructions provide you with information about caring for yourself after your procedure. Your health care provider may also give you more specific instructions. Your treatment has been planned according to current medical practices, but problems sometimes occur. Call your health care provider if you have any problems or questions after your procedure. What can I expect after the procedure? After the procedure, it is common to have:  Vaginal pain.  Vaginal numbness.  Vaginal swelling.  Bloody vaginal discharge.  Follow these instructions at home: Activity   Rest as told by your health care provider.  Do not lift, push, or pull more than 5 lb (2.3 kg).  Avoid activities that require a lot of energy for as long as told by your health care provider. This includes any exercise.  Raise (elevate) your legs while sitting or lying down.  Avoid standing or sitting in one place for long periods of time.  Do not cross your legs, especially when sitting. Bathing  Do not take baths, swim, or use a hot tub until your health care provider approves. Ask your health care provider if you can take showers. You may only be allowed to take sponge baths for bathing.  After passing urine or a bowel movement, wipe yourself from front to back and clean your vaginal area using a spray bottle.  If told by your health care provider, take a sitz bath to help with discomfort. This is a warm water bath you take while sitting down. ? Do this 3-4 times per day, or as often as told by your health care provider. ? The water should only come up to your hips and cover your buttocks. ? You may pat the area  dry with a soft, clean towel. If needed, you may then gently dry the area with a hair dryer on a cool setting for 5-10 minutes. An enclosed box fan may also be used to gently dry the area. Incision care   Follow instructions from your health care provider about how to take care of your incision. Make sure you: ? Wash your hands with soap and water before you change your bandage (dressing). If soap and water are not available, use hand sanitizer. ? Change your dressing as told by your health care provider. ? Leave stitches (sutures), skin glue, adhesive strips, or surgical clips in place. These skin closures may need to stay in place for 2 weeks or longer. If adhesive strip edges start to loosen and curl up, you may trim the loose edges. Do not remove adhesive strips completely unless your health care provider tells you to do that.  Check your incision area every day for signs of infection. Check for: ? More redness, swelling, or pain. ? More fluid or blood. ? Warmth. ? Pus or a bad smell. Lifestyle  Do not douche or use tampons until your health care provider approves.  Do not have sex until your health care provider approves. Tell your health care provider if you have pain or numbness when you return to sexual activity.  Wear comfortable, loose-fitting clothing. Driving  Do not drive or operate heavy machinery while taking prescription pain  medicine.  Do not drive for 24 hours if you received a medicine to help you relax (sedative). General instructions   Take over-the-counter and prescription medicines only as told by your health care provider.  Take a stool softener to help prevent constipation as told by your health care provider. You may need to do this if you are taking prescription pain medicine.  Drink enough fluid to keep your urine clear or pale yellow.  If you were sent home with a drain, take care of it as told by your health care provider.  Wear compression stockings  as told by your health care provider. These stockings help to prevent blood clots and reduce swelling in your legs.  Keep all follow-up visits as told by your health care provider. This is important. Contact a health care provider if:  You have more redness, swelling, or pain around your incision.  You have more fluid or blood coming from your incision.  Your incision feels warm to the touch.  You have pus or a bad smell coming from your incision.  You have a fever.  You have painful or bloody urination.  You feel nauseous or you vomit.  You develop diarrhea.  You develop constipation.  You develop a rash.  You feel dizzy or light-headed.  You have pain that does not get better with medicine.  Your incision breaks open. Get help right away if:  You faint.  You develop leg or chest pain.  You develop abdominal pain.  You develop shortness of breath. This information is not intended to replace advice given to you by your health care provider. Make sure you discuss any questions you have with your health care provider. Document Released: 06/09/2004 Document Revised: 04/02/2016 Document Reviewed: 10/21/2015 Elsevier Interactive Patient Education  [employer]2018 [employer].

## 2017-04-28 NOTE — Progress Notes (Signed)
Followup Note: Gyn-Onc   Cathy Smith 51 y.o. female  Chief Complaint  Patient presents with  . 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 S/p CO2 laser of vulva (circumferential, clitoris) on 02/25/15. LGSIL pap, high risk HPV infection, persistent VIN.  Plan:  Follow-up today's vaginal biopsy.  Schedule for wide local excision of vulva and CO2 laser of vulva for persistent VIN.  Interval History Patient returns today for routine vulvar evaluation. She has no complaints.   Her last Pap smear in March 2018 showed LGSIL + hrHPV.   HPI: 51 year old African American female seen in consultation requested of [name], and Dr. Marice Potterove regarding management of a newly diagnosed severe dysplasia of the upper vagina. Briefly, the patient's history includes having a past history of abdominal hysterectomy for what we understand and have been fibroids. She denies any problems with Pap smears prior to the hysterectomy. More recently she has had some abnormal Pap smears ultimately resulting in colposcopy and directed biopsy on 04/07/2012. 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  The patient did well until March 2015 with a Pap smear showing high-grade dysplasia once again. In April and May 2015 the patient received 2 additional months of Efudex. From June 2015 through February 2016 her Pap smears only showed low-grade SIL.  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 She was scheduled for a wide local excision of the right anterior vulva which was performed on 05/06/15 and revealed VIN3. Margins were positive. Intraoperative evaluation showed lesions consistent with VIN3 on bilateral posterior labia. These were biopsied (but  not treated at the patient's request). These biopsies also returned as positive for VIN3.  On 07/23/15 she was taken to the OR for an extensive CO2 laser ablation of the circumferential labia minora and majora to ablate all acetowhite areas. No pathology was taken. The clitoris was spared.   Her VIN 3 recurred and on 02/25/16 she returned to the OR for CO2 laser which included the anterior vulva and clitoris.   Review of Systems:10 point review of systems is negative as noted above.   Vitals: Blood pressure 120/82, pulse 95, temperature 98.2 F (36.8 C), resp. rate 20, weight 149 lb (67.6 kg), SpO2 99 %.  Physical Exam: General : The patient is a healthy woman in no acute distress.  HEENT: normocephalic, extraoccular movements normal; neck is supple without thyromegally  Lynphnodes: Supraclavicular and inguinal nodes not enlarged  Abdomen: Soft, non-tender, no ascites, no organomegally, no masses, no hernias  Pelvic:  EGBUS: Acetic acid applied (4%). well healed labia majora and minora. Minimal leukoplakia bilateral anterior labia minora and right labia majora.  Vagina: Normal, no lesions. Pap taken Urethra and Bladder: Normal, non-tender  Cervix: Surgically absent  Uterus: Surgically absent  Bi-manual examination: Non-tender; no adenxal masses or nodularity  Rectal: normal sphincter tone, no masses, no blood  Lower extremities: No edema or varicosities. Normal range of motion   PROCEDURE NOTE:  Vaginal colposcopy with biopsy. Verbal consent obtained. Time out performed. Speculum placed. 5% acetic acid applied to entire vagina. Colposcope used to evaluate entire vagina.  Area of erythema seen at posterior vaginal wall without leukoplakia or acetowhite changes. Biopsy taken from this area with forcep. Not bleeding. Condition: stable SPecimen:  posterior vaginal wall     No Known Allergies  Past Medical History:  Diagnosis Date  . History of condyloma acuminatum   . History  of uterine fibroid   . History of vaginal dysplasia    VAIN III  . [facility] infection ([facility] hat[facility]  . Hypertension   . Hypertriglyceridemia without hypercholesterolemia   . PONV (postoperative nausea and vomiting)   . VIN III (vulvar intraepithelial neoplasia III)   . Wears glasses     Past Surgical History:  Procedure Laterality Date  . ABDOMINAL HYSTERECTOMY  1995 approx  . ANTERIOR CERVICAL DECOMP/DISCECTOMY FUSION  11-30-2009   C4 -- C6  . CO2 LASER APPLICATION Bilateral 07/23/2015   Procedure: BILATERAL CO2 LASER ABLATION OF THE VULVA;  Surgeon: Adolphus Birchwood, MD;  Location: [facility];  Service: Gynecology;  Laterality: Bilateral;  . CO2 LASER APPLICATION N/A 02/25/2016   Procedure: CO2 LASER OF THE VULVAR;  Surgeon: Adolphus Birchwood, MD;  Location: [facility] [place];  Service: Gynecology;  Laterality: N/A;  . COLONOSCOPY  01-28-2015  . I & D LEFT BUTTOCK ABSCESS  04-28-2001  . VULVECTOMY Right 05/06/2015   Procedure: WIDE LOCAL  EXCISION  OF RIGHT VULVA;  Surgeon: Adolphus Birchwood, MD;  Location: [facility];  Service: Gynecology;  Laterality: Right;    Current Outpatient Prescriptions  Medication Sig Dispense Refill  . Darunavir Ethanolate (PREZISTA) 800 MG tablet Take 1 tablet (800 mg total) by mouth daily. 30 tablet 11  . elvitegravir-cobicistat-emtricitabine-tenofovir (GENVOYA) 150-150-200-10 MG TABS tablet Take 1 tablet by mouth daily with breakfast. 30 tablet 11  . Multiple Vitamin (MULTIVITAMIN) tablet Take 1 tablet by mouth daily. Reported on 01/13/2016    . pravastatin (PRAVACHOL) 40 MG tablet TAKE 1 TABLET(40 MG) BY MOUTH DAILY 90 tablet 0  . spironolactone (ALDACTONE) 25 MG tablet TAKE 1 TABLET BY MOUTH DAILY. 90 tablet 2   No current facility-administered medications for this visit.     Social History   Social History  . Marital status: Married    Spouse name: N/A  . Number of children: N/A  .  Years of education: N/A   Occupational History  . Not on file.   Social History Main Topics  . Smoking status: Never Smoker  . Smokeless tobacco: Never Used  . Alcohol use No  . Drug use: No  . Sexual activity: Yes    Partners: Male    Birth control/ protection: Condom     Comment: pt. given condoms   Other Topics Concern  . Not on file   Social History Narrative  . No narrative on file    Family History  Problem Relation Age of Onset  . Cataracts Mother   . Hypertension Father   . Colon cancer Neg Hx       Quinn Axe, MD 04/28/2017, 2:12 PM

## 2017-04-29 ENCOUNTER — Encounter (HOSPITAL_BASED_OUTPATIENT_CLINIC_OR_DEPARTMENT_OTHER): Payer: Self-pay | Admitting: *Deleted

## 2017-04-29 NOTE — Progress Notes (Signed)
NPO AFTER MN W/ EXCEPTION CLEAR LIQUIDS  UNTIL 0800 (NO CREAM/ MILK PRODUCTS).  ARRIVE AT 1230.  NEEDS EKG.  CURRENT LAB RESULTS IN CHART AND EPIC.

## 2017-05-04 ENCOUNTER — Ambulatory Visit (HOSPITAL_BASED_OUTPATIENT_CLINIC_OR_DEPARTMENT_OTHER): Payer: Medicare HMO | Admitting: Anesthesiology

## 2017-05-04 ENCOUNTER — Telehealth: Payer: Self-pay | Admitting: *Deleted

## 2017-05-04 ENCOUNTER — Encounter (HOSPITAL_BASED_OUTPATIENT_CLINIC_OR_DEPARTMENT_OTHER): Payer: Self-pay | Admitting: *Deleted

## 2017-05-04 ENCOUNTER — Ambulatory Visit (HOSPITAL_BASED_OUTPATIENT_CLINIC_OR_DEPARTMENT_OTHER)
Admission: RE | Admit: 2017-05-04 | Discharge: 2017-05-04 | Disposition: A | Discharge status: Home / Self Care | Payer: Medicare HMO | Source: Ambulatory Visit | Attending: Gynecologic Oncology | Admitting: Gynecologic Oncology

## 2017-05-04 ENCOUNTER — Encounter (HOSPITAL_BASED_OUTPATIENT_CLINIC_OR_DEPARTMENT_OTHER)
Admission: RE | Disposition: A | Discharge status: Home / Self Care | Payer: Self-pay | Source: Ambulatory Visit | Attending: Gynecologic Oncology

## 2017-05-04 DIAGNOSIS — D072 Carcinoma in situ of vagina: Secondary | ICD-10-CM | POA: Diagnosis not present

## 2017-05-04 DIAGNOSIS — I10 Essential (primary) hypertension: Secondary | ICD-10-CM | POA: Insufficient documentation

## 2017-05-04 DIAGNOSIS — Z79899 Other long term (current) drug therapy: Secondary | ICD-10-CM | POA: Insufficient documentation

## 2017-05-04 DIAGNOSIS — D071 Carcinoma in situ of vulva: Secondary | ICD-10-CM | POA: Diagnosis present

## 2017-05-04 DIAGNOSIS — N390 Urinary tract infection, site not specified: Secondary | ICD-10-CM | POA: Diagnosis not present

## 2017-05-04 DIAGNOSIS — E7800 Pure hypercholesterolemia, unspecified: Secondary | ICD-10-CM | POA: Diagnosis not present

## 2017-05-04 DIAGNOSIS — B20 Human immunodeficiency virus [HIV] disease: Secondary | ICD-10-CM | POA: Diagnosis not present

## 2017-05-04 DIAGNOSIS — E781 Pure hyperglyceridemia: Secondary | ICD-10-CM | POA: Insufficient documentation

## 2017-05-04 DIAGNOSIS — B977 Papillomavirus as the cause of diseases classified elsewhere: Secondary | ICD-10-CM | POA: Diagnosis present

## 2017-05-04 HISTORY — PX: VULVECTOMY: SHX1086

## 2017-05-04 HISTORY — DX: Personal history of vulvar dysplasia: Z87.412

## 2017-05-04 HISTORY — PX: CO2 LASER APPLICATION: SHX5778

## 2017-05-04 SURGERY — WIDE EXCISION VULVECTOMY
Anesthesia: General

## 2017-05-04 MED ORDER — ONDANSETRON HCL 4 MG/2ML IJ SOLN
INTRAMUSCULAR | Status: AC
  Filled 2017-05-04: qty 2

## 2017-05-04 MED ORDER — PROPOFOL 10 MG/ML IV BOLUS
INTRAVENOUS | Status: AC
  Filled 2017-05-04: qty 20

## 2017-05-04 MED ORDER — FENTANYL CITRATE (PF) 250 MCG/5ML IJ SOLN
INTRAMUSCULAR | Status: AC
  Filled 2017-05-04: qty 5

## 2017-05-04 MED ORDER — MEPERIDINE HCL 25 MG/ML IJ SOLN
6.2500 mg | INTRAMUSCULAR | Status: DC | PRN
  Filled 2017-05-04: qty 1

## 2017-05-04 MED ORDER — SENNA 8.6 MG PO TABS: 1.0000 | ORAL_TABLET | Freq: Every day | ORAL | 0 refills | Status: DC

## 2017-05-04 MED ORDER — ONDANSETRON HCL 4 MG/2ML IJ SOLN
INTRAMUSCULAR | Status: DC | PRN
  Administered 2017-05-04: 4 mg via INTRAVENOUS

## 2017-05-04 MED ORDER — MIDAZOLAM HCL 2 MG/2ML IJ SOLN
INTRAMUSCULAR | Status: AC
  Filled 2017-05-04: qty 2

## 2017-05-04 MED ORDER — LACTATED RINGERS IV SOLN
INTRAVENOUS | Status: DC
  Administered 2017-05-04: 13:00:00 via INTRAVENOUS
  Filled 2017-05-04: qty 1000

## 2017-05-04 MED ORDER — FENTANYL CITRATE (PF) 100 MCG/2ML IJ SOLN
INTRAMUSCULAR | Status: DC | PRN
  Administered 2017-05-04: 25 ug via INTRAVENOUS
  Administered 2017-05-04: 50 ug via INTRAVENOUS

## 2017-05-04 MED ORDER — PROMETHAZINE HCL 25 MG/ML IJ SOLN
6.2500 mg | INTRAMUSCULAR | Status: DC | PRN
  Filled 2017-05-04: qty 1

## 2017-05-04 MED ORDER — ACETIC ACID 5 % SOLN
Status: DC | PRN
  Administered 2017-05-04: 1 via TOPICAL

## 2017-05-04 MED ORDER — OXYCODONE HCL 5 MG/5ML PO SOLN
5.0000 mg | Freq: Once | ORAL | Status: DC | PRN
  Filled 2017-05-04: qty 5

## 2017-05-04 MED ORDER — HYDROMORPHONE HCL 1 MG/ML IJ SOLN
0.2500 mg | INTRAMUSCULAR | Status: DC | PRN
  Filled 2017-05-04: qty 0.5

## 2017-05-04 MED ORDER — OXYCODONE-ACETAMINOPHEN 5-325 MG PO TABS
ORAL_TABLET | ORAL | Status: AC
  Filled 2017-05-04: qty 1

## 2017-05-04 MED ORDER — OXYCODONE-ACETAMINOPHEN 5-325 MG PO TABS
1.0000 | ORAL_TABLET | ORAL | Status: DC | PRN
  Administered 2017-05-04: 1 via ORAL
  Filled 2017-05-04: qty 1

## 2017-05-04 MED ORDER — LIDOCAINE 2% (20 MG/ML) 5 ML SYRINGE
INTRAMUSCULAR | Status: DC | PRN
  Administered 2017-05-04: 80 mg via INTRAVENOUS

## 2017-05-04 MED ORDER — LIDOCAINE 2% (20 MG/ML) 5 ML SYRINGE
INTRAMUSCULAR | Status: AC
  Filled 2017-05-04: qty 5

## 2017-05-04 MED ORDER — PROPOFOL 10 MG/ML IV BOLUS
INTRAVENOUS | Status: DC | PRN
  Administered 2017-05-04: 180 mg via INTRAVENOUS

## 2017-05-04 MED ORDER — DEXAMETHASONE SODIUM PHOSPHATE 4 MG/ML IJ SOLN
INTRAMUSCULAR | Status: DC | PRN
  Administered 2017-05-04: 10 mg via INTRAVENOUS

## 2017-05-04 MED ORDER — SILVER SULFADIAZINE 1 % EX CREA
TOPICAL_CREAM | CUTANEOUS | Status: DC | PRN
  Administered 2017-05-04: 1 via TOPICAL

## 2017-05-04 MED ORDER — DEXAMETHASONE SODIUM PHOSPHATE 10 MG/ML IJ SOLN
INTRAMUSCULAR | Status: AC
  Filled 2017-05-04: qty 1

## 2017-05-04 MED ORDER — OXYCODONE HCL 5 MG PO TABS
5.0000 mg | ORAL_TABLET | Freq: Once | ORAL | Status: DC | PRN
  Filled 2017-05-04: qty 1

## 2017-05-04 MED ORDER — LIDOCAINE HCL 1 % IJ SOLN
INTRAMUSCULAR | Status: DC | PRN
  Administered 2017-05-04: 17 mL

## 2017-05-04 MED ORDER — MIDAZOLAM HCL 5 MG/5ML IJ SOLN
INTRAMUSCULAR | Status: DC | PRN
  Administered 2017-05-04: 2 mg via INTRAVENOUS

## 2017-05-04 MED ORDER — OXYCODONE-ACETAMINOPHEN 5-325 MG PO TABS: 1.0000 | ORAL_TABLET | ORAL | 0 refills | Status: DC | PRN

## 2017-05-04 SURGICAL SUPPLY — 64 items
APPLICATOR COTTON TIP 6IN STRL (MISCELLANEOUS) IMPLANT
BAG DRN ANRFLXCHMBR STRAP LEK (BAG)
BAG URINE DRAINAGE (UROLOGICAL SUPPLIES) IMPLANT
BAG URINE LEG 19OZ MD ST LTX (BAG) IMPLANT
BLADE CLIPPER SURG (BLADE) IMPLANT
BLADE SURG 15 STRL LF DISP TIS (BLADE) ×1 IMPLANT
BLADE SURG 15 STRL SS (BLADE) ×2
BRIEF STRETCH FOR OB PAD LRG (UNDERPADS AND DIAPERS) ×2 IMPLANT
CANISTER SUCT 3000ML PPV (MISCELLANEOUS) ×2 IMPLANT
CANISTER SUCTION 1200CC (MISCELLANEOUS) IMPLANT
CATH FOLEY 2WAY SLVR  5CC 14FR (CATHETERS)
CATH FOLEY 2WAY SLVR  5CC 16FR (CATHETERS)
CATH FOLEY 2WAY SLVR 5CC 14FR (CATHETERS) IMPLANT
CATH FOLEY 2WAY SLVR 5CC 16FR (CATHETERS) IMPLANT
CATH ROBINSON RED A/P 14FR (CATHETERS) ×1 IMPLANT
CATH ROBINSON RED A/P 16FR (CATHETERS) ×2 IMPLANT
COVER BACK TABLE 60X90IN (DRAPES) ×2 IMPLANT
DEPRESSOR TONGUE BLADE STERILE (MISCELLANEOUS) ×2 IMPLANT
DRAPE LG THREE QUARTER DISP (DRAPES) ×2 IMPLANT
DRAPE UNDERBUTTOCKS STRL (DRAPE) ×2 IMPLANT
DRSG TELFA 3X8 NADH (GAUZE/BANDAGES/DRESSINGS) IMPLANT
ELECT BALL LEEP 3MM BLK (ELECTRODE) IMPLANT
ELECT REM PT RETURN 9FT ADLT (ELECTROSURGICAL) ×2
ELECTRODE REM PT RTRN 9FT ADLT (ELECTROSURGICAL) ×1 IMPLANT
GAUZE SPONGE 4X4 16PLY XRAY LF (GAUZE/BANDAGES/DRESSINGS) IMPLANT
GLOVE BIO SURGEON STRL SZ 6 (GLOVE) ×4 IMPLANT
GOWN STRL REUS W/ TWL LRG LVL3 (GOWN DISPOSABLE) ×2 IMPLANT
GOWN STRL REUS W/TWL LRG LVL3 (GOWN DISPOSABLE) ×4
KIT RM TURNOVER CYSTO AR (KITS) ×2 IMPLANT
LEGGING LITHOTOMY PAIR STRL (DRAPES) ×2 IMPLANT
NDL HYPO 25X1 1.5 SAFETY (NEEDLE) ×1 IMPLANT
NEEDLE HYPO 25X1 1.5 SAFETY (NEEDLE) ×2 IMPLANT
NS IRRIG 500ML POUR BTL (IV SOLUTION) ×2 IMPLANT
PACK BASIN DAY SURGERY FS (CUSTOM PROCEDURE TRAY) ×2 IMPLANT
PACK VAGINAL MINOR WOMEN LF (CUSTOM PROCEDURE TRAY) ×2 IMPLANT
PAD DRESSING TELFA 3X8 NADH (GAUZE/BANDAGES/DRESSINGS) IMPLANT
PAD OB MATERNITY 4.3X12.25 (PERSONAL CARE ITEMS) ×2 IMPLANT
PAD PREP 24X48 CUFFED NSTRL (MISCELLANEOUS) ×2 IMPLANT
PENCIL BUTTON HOLSTER BLD 10FT (ELECTRODE) IMPLANT
SCOPETTES 8  STERILE (MISCELLANEOUS) ×2
SCOPETTES 8 STERILE (MISCELLANEOUS) ×2 IMPLANT
SUT VIC AB 0 CT1 36 (SUTURE) IMPLANT
SUT VIC AB 0 SH 27 (SUTURE) ×2 IMPLANT
SUT VIC AB 2-0 CT2 27 (SUTURE) IMPLANT
SUT VIC AB 2-0 SH 27 (SUTURE)
SUT VIC AB 2-0 SH 27XBRD (SUTURE) IMPLANT
SUT VIC AB 3-0 PS2 18 (SUTURE)
SUT VIC AB 3-0 PS2 18XBRD (SUTURE) IMPLANT
SUT VIC AB 3-0 SH 27 (SUTURE) ×2
SUT VIC AB 3-0 SH 27X BRD (SUTURE) ×1 IMPLANT
SUT VIC AB 4-0 PS2 18 (SUTURE) IMPLANT
SUT VICRYL 0 UR6 27IN ABS (SUTURE) IMPLANT
SUT VICRYL 2 0 18  UND BR (SUTURE)
SUT VICRYL 2 0 18 UND BR (SUTURE) IMPLANT
SUT VICRYL 4-0 PS2 18IN ABS (SUTURE) ×2 IMPLANT
SYR BULB IRRIGATION 50ML (SYRINGE) ×2 IMPLANT
SYR CONTROL 10ML LL (SYRINGE) IMPLANT
TOWEL OR 17X24 6PK STRL BLUE (TOWEL DISPOSABLE) ×4 IMPLANT
TRAY DSU PREP LF (CUSTOM PROCEDURE TRAY) ×2 IMPLANT
TUBE CONNECTING 12X1/4 (SUCTIONS) ×2 IMPLANT
VACUUM HOSE 7/8X10 W/ WAND (MISCELLANEOUS) IMPLANT
VACUUM HOSE/TUBING 7/8INX6FT (MISCELLANEOUS) ×2 IMPLANT
WATER STERILE IRR 500ML POUR (IV SOLUTION) ×2 IMPLANT
YANKAUER SUCT BULB TIP NO VENT (SUCTIONS) ×2 IMPLANT

## 2017-05-04 NOTE — Telephone Encounter (Signed)
Per staff message scheduled post op visit. Patient will receive appt with discharge summary

## 2017-05-04 NOTE — Anesthesia Preprocedure Evaluation (Addendum)
Anesthesia Evaluation  Patient identified by MRN, date of birth, ID band Patient awake    Reviewed: Allergy & Precautions, NPO status , Patient's Chart, lab work & pertinent test results  History of Anesthesia Complications (+) PONV and history of anesthetic complications  Airway Mallampati: II  TM Distance: >3 FB Neck ROM: Full    Dental no notable dental hx.    Pulmonary neg pulmonary ROS,    Pulmonary exam normal breath sounds clear to auscultation       Cardiovascular Exercise Tolerance: Good hypertension, Pt. on medications Normal cardiovascular exam Rhythm:Regular Rate:Normal     Neuro/Psych negative neurological ROS  negative psych ROS   GI/Hepatic negative GI ROS, Neg liver ROS,   Endo/Other  negative endocrine ROS  Renal/GU negative Renal ROS  negative genitourinary   Musculoskeletal negative musculoskeletal ROS (+)   Abdominal   Peds negative pediatric ROS (+)  Hematology  (+) HIV,   Anesthesia Other Findings HIV positive  Reproductive/Obstetrics negative OB ROS                             Lab Results  Component Value Date   WBC 3.4 (L) 04/13/2017   HGB 12.7 04/13/2017   HCT 37.7 04/13/2017   MCV 96.2 04/13/2017   PLT 190 04/13/2017   Lab Results  Component Value Date   CREATININE 0.96 04/13/2017   BUN 13 04/13/2017   NA 137 04/13/2017   K 4.2 04/13/2017   CL 103 04/13/2017   CO2 19 (L) 04/13/2017    Anesthesia Physical  Anesthesia Plan  ASA: III  Anesthesia Plan: General   Post-op Pain Management:    Induction: Intravenous  PONV Risk Score and Plan: 4 or greater and Ondansetron, Dexamethasone, Propofol, Midazolam and Scopolamine patch - Pre-op  Airway Management Planned: LMA  Additional Equipment: None  Intra-op Plan:   Post-operative Plan: Extubation in OR  Informed Consent: I have reviewed the patients History and Physical, chart, labs and  discussed the procedure including the risks, benefits and alternatives for the proposed anesthesia with the patient or authorized representative who has indicated his/her understanding and acceptance.   Dental advisory given  Plan Discussed with: CRNA  Anesthesia Plan Comments:       Anesthesia Quick Evaluation

## 2017-05-04 NOTE — Anesthesia Postprocedure Evaluation (Signed)
Anesthesia Post Note  Patient: Cathy Smith  Procedure(s) Performed: Procedure(s) (LRB): WIDE EXCISION VULVECTOMY (N/A) CO2 LASER VAPORIZATION OF THE VULVA (N/A)     Patient location during evaluation: PACU Anesthesia Type: General Level of consciousness: sedated and patient cooperative Pain management: pain level controlled Vital Signs Assessment: post-procedure vital signs reviewed and stable Respiratory status: spontaneous breathing Cardiovascular status: stable Anesthetic complications: no    Last Vitals:  Vitals:   05/04/17 1515 05/04/17 1718  BP: (!) 143/93 123/76  Pulse: 79 74  Resp: 16 17  Temp:  37.3 C    Last Pain:  Vitals:   05/04/17 1718  TempSrc:   PainSc: 6                  Lewie LoronJohn Kore Madlock

## 2017-05-04 NOTE — Op Note (Signed)
PATIENT: Cathy FettersRenee Nettleton ENCOUNTER DATE: 05/04/17   Preop Diagnosis: VIN 3  Postoperative Diagnosis: same  Surgery: Partial simple right vulvectomy, CO2 laser bilateral vulva  Surgeons:  [name], Sevon Rotert Caroline, MD Assistant: none  Anesthesia: General   Estimated blood loss: <285ml  IVF:  200ml   Urine output: 20 ml   Complications: None   Pathology: right vulva with marking stitch at 12 o'clock  Operative findings: acetowhite change to right labia majora and circumferentially around anterior labia minora and left introitus.  Procedure: The patient was identified in the preoperative holding area. Informed consent was signed on the chart. Patient was seen history was reviewed and exam was performed.   The patient was then taken to the operating room and placed in the supine position with SCD hose on. General anesthesia was then induced without difficulty. She was then placed in the dorsolithotomy position. The perineum was prepped with Betadine. The vagina was prepped with Betadine. The patient was then draped after the prep was dried. A Foley catheter was inserted into the bladder under sterile conditions.  Timeout was performed the patient, procedure, antibiotic, allergy, and length of procedure. 5% acetic acid solution was applied to the perineum. The vulvar tissues were inspected for areas of acetowhite changes or leukoplakia. The lesion was identified and the marking pen was used to circumscribe the area with appropriate surgical margins. The subcuticular tissues were infiltrated with 1% lidocaine. The 15 blade scalpel was used to make an incision through the skin circumferentially as marked. The skin elipse was grasped and was separated from the underlying deep dermal tissues with the bovie device. After the specimen had been completely resected, it was oriented and marked at 12 o'clock anterior with a 0-vicryl suture. The bovie was used to obtain hemostasis at the surgical bed. The  subcutaneous tissues were irrigated and made hemostatic.   The deep dermal layer was approximated with 3-0vicryl mattress sutures to bring the skin edges into approximation and off tension. The wound was closed following langher's lines. The cutaneous layer was closed with interrupted 4-0 vicryl stitches and mattress sutures to ensure a tension free and hemostatic closure. The perineum was again irrigated.  The patient's surgical field was draped with wet towels. The staff and patient ensured laser-safe eyewear and masks were fitted. The laser was set to 12 watts continuous. The laser was tested for accuracy on a tongue depressor.  The laser was applied to the circumscribed area of the vulva that had been previously identified. The tissue was ablated to the desired depth and the eschar was removed with a moistened sponge. When the entire lesion had been ablated the procedure was complete.  Silvadine cream was applied to the laser site.  All instrument, suture, laparotomy, Ray-Tec, and needle counts were correct x2. The patient tolerated the procedure well and was taken recovery room in stable condition. This is Adolphus BirchwoodEmma Malachi Kinzler dictating an operative note on Jolayne PantherRenee Chaudhari.  [name], Ardyth Kelso Caroline, MD

## 2017-05-04 NOTE — H&P (View-Only) (Signed)
Followup Note: Gyn-Onc   Cathy Smith 51 y.o. female  Chief Complaint  Patient presents with  . 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 S/p CO2 laser of vulva (circumferential, clitoris) on 02/25/15. LGSIL pap, high risk HPV infection, persistent VIN.  Plan:  Follow-up today's vaginal biopsy.  Schedule for wide local excision of vulva and CO2 laser of vulva for persistent VIN.  Interval History Patient returns today for routine vulvar evaluation. She has no complaints.   Her last Pap smear in March 2018 showed LGSIL + hrHPV.   HPI: 46-year-old African American female seen in consultation requested of [name], and Dr. Dove regarding management of a newly diagnosed severe dysplasia of the upper vagina. Briefly, the patient's history includes having a past history of abdominal hysterectomy for what we understand and have been fibroids. She denies any problems with Pap smears prior to the hysterectomy. More recently she has had some abnormal Pap smears ultimately resulting in colposcopy and directed biopsy on 04/07/2012. 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  The patient did well until March 2015 with a Pap smear showing high-grade dysplasia once again. In April and May 2015 the patient received 2 additional months of Efudex. From June 2015 through February 2016 her Pap smears only showed low-grade SIL.  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 She was scheduled for a wide local excision of the right anterior vulva which was performed on 05/06/15 and revealed VIN3. Margins were positive. Intraoperative evaluation showed lesions consistent with VIN3 on bilateral posterior labia. These were biopsied (but  not treated at the patient's request). These biopsies also returned as positive for VIN3.  On 07/23/15 she was taken to the OR for an extensive CO2 laser ablation of the circumferential labia minora and majora to ablate all acetowhite areas. No pathology was taken. The clitoris was spared.   Her VIN 3 recurred and on 02/25/16 she returned to the OR for CO2 laser which included the anterior vulva and clitoris.   Review of Systems:10 point review of systems is negative as noted above.   Vitals: Blood pressure 120/82, pulse 95, temperature 98.2 F (36.8 C), resp. rate 20, weight 149 lb (67.6 kg), SpO2 99 %.  Physical Exam: General : The patient is a healthy woman in no acute distress.  HEENT: normocephalic, extraoccular movements normal; neck is supple without thyromegally  Lynphnodes: Supraclavicular and inguinal nodes not enlarged  Abdomen: Soft, non-tender, no ascites, no organomegally, no masses, no hernias  Pelvic:  EGBUS: Acetic acid applied (4%). well healed labia majora and minora. Minimal leukoplakia bilateral anterior labia minora and right labia majora.  Vagina: Normal, no lesions. Pap taken Urethra and Bladder: Normal, non-tender  Cervix: Surgically absent  Uterus: Surgically absent  Bi-manual examination: Non-tender; no adenxal masses or nodularity  Rectal: normal sphincter tone, no masses, no blood  Lower extremities: No edema or varicosities. Normal range of motion   PROCEDURE NOTE:  Vaginal colposcopy with biopsy. Verbal consent obtained. Time out performed. Speculum placed. 5% acetic acid applied to entire vagina. Colposcope used to evaluate entire vagina.  Area of erythema seen at posterior vaginal wall without leukoplakia or acetowhite changes. Biopsy taken from this area with forcep. Not bleeding. Condition: stable SPecimen:   posterior vaginal wall     No Known Allergies  Past Medical History:  Diagnosis Date  . History of condyloma acuminatum   . History  of uterine fibroid   . History of vaginal dysplasia    VAIN III  . [facility] infection ([facility]) [facility]  . Hypertension   . Hypertriglyceridemia without hypercholesterolemia   . PONV (postoperative nausea and vomiting)   . VIN III (vulvar intraepithelial neoplasia III)   . Wears glasses     Past Surgical History:  Procedure Laterality Date  . ABDOMINAL HYSTERECTOMY  1995 approx  . ANTERIOR CERVICAL DECOMP/DISCECTOMY FUSION  11-30-2009   C4 -- C6  . CO2 LASER APPLICATION Bilateral 07/23/2015   Procedure: BILATERAL CO2 LASER ABLATION OF THE VULVA;  Surgeon: Welcome Fults, MD;  Location: [facility];  Service: Gynecology;  Laterality: Bilateral;  . CO2 LASER APPLICATION N/A 02/25/2016   Procedure: CO2 LASER OF THE VULVAR;  Surgeon: Glendon Dunwoody, MD;  Location: [facility];  Service: Gynecology;  Laterality: N/A;  . COLONOSCOPY  01-28-2015  . I & D LEFT BUTTOCK ABSCESS  04-28-2001  . VULVECTOMY Right 05/06/2015   Procedure: WIDE LOCAL  EXCISION  OF RIGHT VULVA;  Surgeon: Josealfredo Adkins, MD;  Location: [facility];  Service: Gynecology;  Laterality: Right;    Current Outpatient Prescriptions  Medication Sig Dispense Refill  . Darunavir Ethanolate (PREZISTA) 800 MG tablet Take 1 tablet (800 mg total) by mouth daily. 30 tablet 11  . elvitegravir-cobicistat-emtricitabine-tenofovir (GENVOYA) 150-150-200-10 MG TABS tablet Take 1 tablet by mouth daily with breakfast. 30 tablet 11  . Multiple Vitamin (MULTIVITAMIN) tablet Take 1 tablet by mouth daily. Reported on 01/13/2016    . pravastatin (PRAVACHOL) 40 MG tablet TAKE 1 TABLET(40 MG) BY MOUTH DAILY 90 tablet 0  . spironolactone (ALDACTONE) 25 MG tablet TAKE 1 TABLET BY MOUTH DAILY. 90 tablet 2   No current facility-administered medications for this visit.     Social History   Social History  . Marital status: Married    Spouse name: N/A  . Number of children: N/A  .  Years of education: N/A   Occupational History  . Not on file.   Social History Main Topics  . Smoking status: Never Smoker  . Smokeless tobacco: Never Used  . Alcohol use No  . Drug use: No  . Sexual activity: Yes    Partners: Male    Birth control/ protection: Condom     Comment: pt. given condoms   Other Topics Concern  . Not on file   Social History Narrative  . No narrative on file    Family History  Problem Relation Age of Onset  . Cataracts Mother   . Hypertension Father   . Colon cancer Neg Hx       Tommye Lehenbauer Caroline, MD 04/28/2017, 2:12 PM         

## 2017-05-04 NOTE — Anesthesia Procedure Notes (Signed)
Procedure Name: Intubation Date/Time: 05/04/2017 2:10 PM Performed by: Renella CunasHAZEL, Adelynne Joerger D Pre-anesthesia Checklist: Patient identified, Emergency Drugs available, Suction available and Patient being monitored Patient Re-evaluated:Patient Re-evaluated prior to inductionOxygen Delivery Method: Circle system utilized Preoxygenation: Pre-oxygenation with 100% oxygen Intubation Type: IV induction Ventilation: Mask ventilation without difficulty Tube type: Oral Number of attempts: 1 Airway Equipment and Method: Stylet and Oral airway Placement Confirmation: ETT inserted through vocal cords under direct vision,  positive ETCO2 and breath sounds checked- equal and bilateral Tube secured with: Tape Dental Injury: Teeth and Oropharynx as per pre-operative assessment

## 2017-05-04 NOTE — Transfer of Care (Signed)
Immediate Anesthesia Transfer of Care Note  Patient: Cathy Smith  Procedure(s) Performed: Procedure(s) (LRB): WIDE EXCISION VULVECTOMY (N/A) CO2 LASER VAPORIZATION OF THE VULVA (N/A)  Patient Location: PACU  Anesthesia Type: General  Level of Consciousness: awake, oriented, sedated and patient cooperative  Airway & Oxygen Therapy: Patient Spontanous Breathing and Patient connected to face mask oxygen  Post-op Assessment: Report given to PACU RN and Post -op Vital signs reviewed and stable  Post vital signs: Reviewed and stable  Complications: No apparent anesthesia complications Last Vitals:  Vitals:   05/04/17 1500 05/04/17 1515  BP: (!) 137/94 (!) 143/93  Pulse: 86 79  Resp: 16 16  Temp:      Last Pain:  Vitals:   05/04/17 1515  TempSrc:   PainSc: 2

## 2017-05-04 NOTE — Discharge Instructions (Signed)
Vulvectomy, Care After °The vulva is the external female genitalia, outside and around the vagina and pubic bone. It consists of: °· The skin on, and in front of, the pubic bone. °· The clitoris. °· The labia majora (large lips) on the outside of the vagina. °· The labia minora (small lips) around the opening of the vagina. °· The opening and the skin in and around the vagina. °A vulvectomy is the removal of the tissue of the vulva, which sometimes includes removal of the lymph nodes and tissue in the groin areas. °These discharge instructions provide you with general information on caring for yourself after you leave the hospital. It is also important that you know the warning signs of complications, so that you can seek treatment. Please read the instructions outlined below and refer to this sheet in the next few weeks. Your caregiver may also give you specific information and medicines. If you have any questions or complications after discharge, please call your caregiver. °ACTIVITY °· Rest as much as possible the first two weeks after discharge. °· Arrange to have help from family or others with your daily activities when you go home. °· Avoid heavy lifting (more than 5 pounds), pushing, or pulling. °· If you feel tired, balance your activity with rest periods. °· Follow your caregiver's instruction about climbing stairs and driving a car. °· Increase activity gradually. °· Do not exercise until you have permission from your caregiver. °LEG AND FOOT CARE °If your doctor has removed lymph nodes from your groin area, there may be an increase in swelling of your legs and feet. You can help prevent swelling by doing the following: °· Elevate your legs while sitting or lying down. °· If your caregiver has ordered special stockings, wear them according to instructions. °· Avoid standing in one place for long periods of time. °· Call the physical therapy department if you have any questions about swelling or treatment  for swelling. °· Avoid salt in your diet. It can cause fluid retention and swelling. °· Do not cross your legs, especially when sitting. °NUTRITION °· You may resume your normal diet. °· Drink 6 to 8 glasses of fluids a day. °· Eat a healthy, balanced diet including portions of food from the meat (protein), milk, fruit, vegetable, and bread groups. °· Your caregiver may recommend you take a multivitamin with iron. °ELIMINATION °· You may notice that your stream of urine is at a different angle, and may tend to spray. Using a plastic funnel may help to decrease urine spray. °· If constipation occurs, drink more liquids, and add more fruits, vegetables, and bran to your diet. You may take a mild laxative, such as Milk of Magnesia, Metamucil, or a stool softener such as Colace, with permission from your caregiver. °HYGIENE °· You may shower and wash your hair. °· Check with your caregiver about tub baths. °· Do not add any bath oils or chemicals to your bath water, after you have permission to take baths. °· While passing urine, pour water from a bottle or spray over your vulva to dilute the urine as it passes the incision (this will decrease burning and discomfort). °· Clean yourself well after moving your bowels. °· After urinating, do not wipe. Dap or pat dry with toilet paper or a dry cleath soft cloth. °· A sitz bath will help keep your perineal area clean, reduce swelling, and provide comfort. °· Avoid wearing underpants for the first 2 weeks and wear loose skirts to   allow circulation of air around the incision °· You do not need to apply dressings, salves or lotions to the wound. °· The stitches are self-dissolving and will absorb and disappear over a couple of months (it is normal to notice the knot from the stitches on toilet paper after voiding). °HOME CARE INSTRUCTIONS  °· Apply a soft ice pack (or frozen bag of peas) to your perineum (vulva) every hour in the first 48 hours after surgery. This will reduce  swelling. °· Avoid activities that involve a lot of friction between your legs. °· Avoid wearing pants or underpants in the 1st 2 weeks (skirts are preferable). °· Take your temperature twice a day and record it, especially if you feel feverish or have chills. °· Follow your caregiver's instructions about medicines, activity, and follow-up appointments after surgery. °· Do not drink alcohol while taking pain medicine. °· Change your dressing as advised by your caregiver. °· You may take over-the-counter medicine for pain, recommended by your caregiver. °· If your pain is not relieved with medicine, call your caregiver. °· Do not take aspirin because it can cause bleeding. °· Do not douche or use tampons (use a nonperfumed sanitary pad). °· Do not have sexual intercourse until your caregiver gives you permission (typically 6 weeks postoperatively). Hugging, kissing, and playful sexual activity is fine with your caregiver's permission. °· Warm sitz baths, with your caregiver's permission, are helpful to control swelling and discomfort. °· Take showers instead of baths, until your caregiver gives you permission to take baths. °· You may take a mild medicine for constipation, recommended by your caregiver. Bran foods and drinking a lot of fluids will help with constipation. °· Make sure your family understands everything about your operation and recovery. °SEEK MEDICAL CARE IF:  °· You notice swelling and redness around the wound area. °· You notice a foul smell coming from the wound or on the surgical dressing. °· You notice the wound is separating. °· You have painful or bloody urination. °· You develop nausea and vomiting. °· You develop diarrhea. °· You develop a rash. °· You have a reaction or allergy from the medicine. °· You feel dizzy or light-headed. °· You need stronger pain medicine. °SEEK IMMEDIATE MEDICAL CARE IF:  °· You develop a temperature of 102° F (38.9° C) or higher. °· You pass out. °· You develop  leg or chest pain. °· You develop abdominal pain. °· You develop shortness of breath. °· You develop bleeding from the wound area. °· You see pus in the wound area. °MAKE SURE YOU:  °· Understand these instructions. °· Will watch your condition. °· Will get help right away if you are not doing well or get worse. °Document Released: 06/09/2004 Document Revised: 03/12/2014 Document Reviewed: 09/27/2009 °Exit[employer]® Patient Information ©2015 [employer]. This information is not intended to replace advice given to you by your health care provider. Make sure you discuss any questions you have with your health care provider. ° ° °Post Anesthesia Home Care Instructions ° °Activity: °Get plenty of rest for the remainder of the day. A responsible individual must stay with you for 24 hours following the procedure.  °For the next 24 hours, DO NOT: °-Drive a car °-Operate machinery °-Drink alcoholic beverages °-Take any medication unless instructed by your physician °-Make any legal decisions or sign important papers. ° °Meals: °Start with liquid foods such as gelatin or soup. Progress to regular foods as tolerated. Avoid greasy, spicy, heavy foods. If nausea and/or vomiting   occur, drink only clear liquids until the nausea and/or vomiting subsides. Call your physician if vomiting continues. ° °Special Instructions/Symptoms: °Your throat may feel dry or sore from the anesthesia or the breathing tube placed in your throat during surgery. If this causes discomfort, gargle with warm salt water. The discomfort should disappear within 24 hours. ° °If you had a scopolamine patch placed behind your ear for the management of post- operative nausea and/or vomiting: ° °1. The medication in the patch is effective for 72 hours, after which it should be removed.  Wrap patch in a tissue and discard in the trash. Wash hands thoroughly with soap and water. °2. You may remove the patch earlier than 72 hours if you experience unpleasant side  effects which may include dry mouth, dizziness or visual disturbances. °3. Avoid touching the patch. Wash your hands with soap and water after contact with the patch. °  ° °

## 2017-05-04 NOTE — Interval H&P Note (Signed)
History and Physical Interval Note:  05/04/2017 1:52 PM  Cathy Smith  has presented today for surgery, with the diagnosis of VULVAR DYSPLAGIA  The various methods of treatment have been discussed with the patient and family. After consideration of risks, benefits and other options for treatment, the patient has consented to  Procedure(s): WIDE EXCISION VULVECTOMY (N/A) CO2 LASER VAPORIZATION OF THE VULVA (N/A) as a surgical intervention .  The patient's history has been reviewed, patient examined, no change in status, stable for surgery.  I have reviewed the patient's chart and labs. 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 Questions were answered to the patient's satisfaction.     [name], Cathy Smith

## 2017-05-05 ENCOUNTER — Encounter (HOSPITAL_BASED_OUTPATIENT_CLINIC_OR_DEPARTMENT_OTHER): Payer: Self-pay | Admitting: Gynecologic Oncology

## 2017-05-06 ENCOUNTER — Telehealth: Payer: Self-pay

## 2017-05-06 NOTE — Telephone Encounter (Signed)
Told Ms Cathy Smith the results of surgical pathology as noted below  By Dr. Andrey Farmerossi. Pt to keep post op appointment on 05-26-17 as scheduled.

## 2017-05-06 NOTE — Telephone Encounter (Signed)
-----   Message from Adolphus BirchwoodEmma Rossi, MD sent at 05/06/2017  3:55 PM EDT ----- Dysplasia found with negative margins. No further intervention at this time.

## 2017-05-10 ENCOUNTER — Other Ambulatory Visit: Payer: Self-pay | Admitting: Gynecologic Oncology

## 2017-05-10 DIAGNOSIS — G8918 Other acute postprocedural pain: Secondary | ICD-10-CM

## 2017-05-10 DIAGNOSIS — D071 Carcinoma in situ of vulva: Secondary | ICD-10-CM

## 2017-05-25 ENCOUNTER — Ambulatory Visit: Payer: Medicare HMO

## 2017-05-25 ENCOUNTER — Other Ambulatory Visit: Payer: Self-pay | Admitting: Gynecologic Oncology

## 2017-05-25 DIAGNOSIS — D071 Carcinoma in situ of vulva: Secondary | ICD-10-CM

## 2017-05-25 DIAGNOSIS — G8918 Other acute postprocedural pain: Secondary | ICD-10-CM

## 2017-05-26 ENCOUNTER — Ambulatory Visit: Payer: Medicare HMO | Attending: Gynecologic Oncology | Admitting: Gynecologic Oncology

## 2017-05-26 ENCOUNTER — Encounter: Payer: Self-pay | Admitting: Gynecologic Oncology

## 2017-05-26 VITALS — BP 123/81 | HR 86 | Temp 97.8°F | Resp 20 | Wt 149.4 lb

## 2017-05-26 DIAGNOSIS — E781 Pure hyperglyceridemia: Secondary | ICD-10-CM | POA: Diagnosis not present

## 2017-05-26 DIAGNOSIS — B20 Human immunodeficiency virus [HIV] disease: Secondary | ICD-10-CM | POA: Insufficient documentation

## 2017-05-26 DIAGNOSIS — D072 Carcinoma in situ of vagina: Secondary | ICD-10-CM

## 2017-05-26 DIAGNOSIS — Z79899 Other long term (current) drug therapy: Secondary | ICD-10-CM | POA: Diagnosis not present

## 2017-05-26 DIAGNOSIS — I10 Essential (primary) hypertension: Secondary | ICD-10-CM | POA: Insufficient documentation

## 2017-05-26 DIAGNOSIS — D071 Carcinoma in situ of vulva: Secondary | ICD-10-CM | POA: Diagnosis not present

## 2017-05-26 NOTE — Progress Notes (Signed)
Followup Note: Gyn-Onc   Cathy Smith 51 y.o. female  Chief Complaint  Patient presents with  . 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 S/p CO2 laser of vulva (circumferential, clitoris) with right WLE on 05/04/17 for VIN 3 (margins negative). LGSIL pap, high risk HPV infection, persistent VIN.  Plan:   Will see her back in 4 months for vulvar inspection.  Interval History  Her last Pap smear in March 2018 showed LGSIL + hrHPV.   She underwent WLE right vulva and CO2 laser of anterior vulva on 05/04/17. She has no complaints postop.  HPI: 51 year old African American female seen in consultation requested of [name], and [name] regarding management of a newly diagnosed severe dysplasia of the upper vagina. Briefly, the patient's history includes having a past history of abdominal hysterectomy for what we understand and have been fibroids. She denies any problems with Pap smears prior to the hysterectomy. More recently she has had some abnormal Pap smears ultimately resulting in colposcopy and directed biopsy on 04/07/2012. 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  The patient did well until March 2015 with a Pap smear showing high-grade dysplasia once again. In April and May 2015 the patient received 2 additional months of Efudex. From June 2015 through February 2016 her Pap smears only showed low-grade SIL.  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 She was scheduled for a wide local excision of the right anterior vulva which was performed on 05/06/15 and revealed VIN3. Margins were positive. Intraoperative evaluation showed lesions consistent with VIN3 on bilateral  posterior labia. These were biopsied (but not treated at the patient's request). These biopsies also returned as positive for VIN3.  On 07/23/15 she was taken to the OR for an extensive CO2 laser ablation of the circumferential labia minora and majora to ablate all acetowhite areas. No pathology was taken. The clitoris was spared.   Her VIN 3 recurred and on 02/25/16 she returned to the OR for CO2 laser which included the anterior vulva and clitoris.   Review of Systems:10 point review of systems is negative as noted above.   Vitals: Blood pressure 123/81, pulse 86, temperature 97.8 F (36.6 C), resp. rate 20, weight 149 lb 6.4 oz (67.8 kg), SpO2 99 %.  Physical Exam: General : The patient is a healthy woman in no acute distress.  HEENT: normocephalic, extraoccular movements normal; neck is supple without thyromegally  Lynphnodes: Supraclavicular and inguinal nodes not enlarged  Abdomen: Soft, non-tender, no ascites, no organomegally, no masses, no hernias  Pelvic:  EGBUS: right vulva appears healing normally. Laser sites healing. Vagina: Normal, no lesions.  Urethra and Bladder: Normal, non-tender  Cervix: Surgically absent  Uterus: Surgically absent  Bi-manual examination: Non-tender; no adenxal masses or nodularity  Rectal: normal sphincter tone, no masses, no blood  Lower extremities: No edema or varicosities. Normal range of motion      No Known Allergies  Past Medical History:  Diagnosis Date  . History of condyloma acuminatum   . History of uterine fibroid   . History of vaginal dysplasia    VAIN III--  oncologist-  dr Andrey Farmer  . History of vulvar dysplasia    VIN III   gyn-oncologist-  dr  Key Cen  . [facility] ([facility] hat[facility]  . Hypertension   . Hypertriglyceridemia without hypercholesterolemia   . PONV (postoperative nausea and vomiting)   . VIN III (vulvar intraepithelial neoplasia III)   . Wears glasses     Past  Surgical History:  Procedure Laterality Date  . ABDOMINAL HYSTERECTOMY  1995 approx  . ANTERIOR CERVICAL DECOMP/DISCECTOMY FUSION  11-30-2009   C4 -- C6  . CO2 LASER APPLICATION Bilateral 07/23/2015   Procedure: BILATERAL CO2 LASER ABLATION OF THE VULVA;  Surgeon: Adolphus Birchwood, MD;  Location: [facility];  Service: Gynecology;  Laterality: Bilateral;  . CO2 LASER APPLICATION N/A 02/25/2016   Procedure: CO2 LASER OF THE VULVAR;  Surgeon: Adolphus Birchwood, MD;  Location: [facility] [place];  Service: Gynecology;  Laterality: N/A;  . CO2 LASER APPLICATION N/A 05/04/2017   Procedure: CO2 LASER VAPORIZATION OF THE VULVA;  Surgeon: Adolphus Birchwood, MD;  Location: [facility] [place];  Service: Gynecology;  Laterality: N/A;  . COLONOSCOPY  01-28-2015  . I & D LEFT BUTTOCK ABSCESS  04-28-2001  . VULVECTOMY Right 05/06/2015   Procedure: WIDE LOCAL  EXCISION  OF RIGHT VULVA;  Surgeon: Adolphus Birchwood, MD;  Location: [facility] [place];  Service: Gynecology;  Laterality: Right;  Marland Kitchen VULVECTOMY N/A 05/04/2017   Procedure: WIDE EXCISION VULVECTOMY;  Surgeon: Adolphus Birchwood, MD;  Location: [facility];  Service: Gynecology;  Laterality: N/A;    Current Outpatient Prescriptions  Medication Sig Dispense Refill  . Darunavir Ethanolate (PREZISTA) 800 MG tablet Take 1 tablet (800 mg total) by mouth daily. (Patient taking differently: Take 800 mg by mouth every morning. ) 30 tablet 11  . elvitegravir-cobicistat-emtricitabine-tenofovir (GENVOYA) 150-150-200-10 MG TABS tablet Take 1 tablet by mouth daily with breakfast. 30 tablet 11  . Multiple Vitamin (MULTIVITAMIN) tablet Take 1 tablet by mouth daily. Reported on 01/13/2016    . pravastatin (PRAVACHOL) 40 MG tablet TAKE 1 TABLET(40 MG) BY MOUTH DAILY (Patient taking differently: TAKE 1 TABLET(40 MG) BY MOUTH DAILY-- takes am) 90 tablet 0  . senna (SENOKOT) 8.6 MG TABS tablet Take 1 tablet (8.6 mg total) by mouth at bedtime. 120 each 0   . spironolactone (ALDACTONE) 25 MG tablet TAKE 1 TABLET BY MOUTH DAILY. (Patient taking differently: Take 25 mg by mouth every morning. TAKE 1 TABLET BY MOUTH DAILY.) 90 tablet 2  . SSD 1 % cream APPLY TOPICALLY AS DIRECTED, AFTER USING THE BATHROOM TO URINATE OR HAVE A BOWEL MOVEMENT 50 g 0   No current facility-administered medications for this visit.     Social History   Social History  . Marital status: Married    Spouse name: N/A  . Number of children: N/A  . Years of education: N/A   Occupational History  . Not on file.   Social History Main Topics  . Smoking status: Never Smoker  . Smokeless tobacco: Never Used  . Alcohol use No  . Drug use: No  . Sexual activity: Yes    Partners: Male    Birth control/ protection: Condom     Comment: pt. given condoms   Other Topics Concern  . Not on file   Social History Narrative  . No narrative on file    Family History  Problem Relation Age of Onset  . Cataracts Mother   . Hypertension Father   . Colon cancer Neg Hx       Quinn Axe, MD 05/26/2017,  5:08 PM

## 2017-05-26 NOTE — Patient Instructions (Signed)
Plan to follow up in November or sooner if needed. 

## 2017-05-31 ENCOUNTER — Ambulatory Visit: Payer: Medicare HMO | Admitting: Gynecologic Oncology

## 2017-06-03 ENCOUNTER — Encounter: Payer: Self-pay | Admitting: Infectious Diseases

## 2017-09-08 ENCOUNTER — Other Ambulatory Visit: Payer: Self-pay | Admitting: Infectious Diseases

## 2017-09-08 DIAGNOSIS — B20 Human immunodeficiency virus [HIV] disease: Secondary | ICD-10-CM

## 2017-09-13 ENCOUNTER — Telehealth: Payer: Self-pay

## 2017-09-13 NOTE — Telephone Encounter (Signed)
Pt called asking when next appt is. LVM for next Monday with Dr Andrey Farmerossi.

## 2017-09-20 ENCOUNTER — Ambulatory Visit (INDEPENDENT_AMBULATORY_CARE_PROVIDER_SITE_OTHER): Payer: Medicare HMO | Admitting: Family Medicine

## 2017-09-20 ENCOUNTER — Encounter: Payer: Self-pay | Admitting: Gynecologic Oncology

## 2017-09-20 ENCOUNTER — Ambulatory Visit: Payer: Medicare HMO | Attending: Gynecologic Oncology | Admitting: Gynecologic Oncology

## 2017-09-20 VITALS — BP 121/80 | HR 73 | Temp 97.7°F | Resp 20 | Ht 62.01 in | Wt 152.5 lb

## 2017-09-20 DIAGNOSIS — I10 Essential (primary) hypertension: Secondary | ICD-10-CM | POA: Diagnosis not present

## 2017-09-20 DIAGNOSIS — Z8249 Family history of ischemic heart disease and other diseases of the circulatory system: Secondary | ICD-10-CM | POA: Diagnosis not present

## 2017-09-20 DIAGNOSIS — Z808 Family history of malignant neoplasm of other organs or systems: Secondary | ICD-10-CM | POA: Insufficient documentation

## 2017-09-20 DIAGNOSIS — Z79899 Other long term (current) drug therapy: Secondary | ICD-10-CM | POA: Diagnosis not present

## 2017-09-20 DIAGNOSIS — Z23 Encounter for immunization: Secondary | ICD-10-CM | POA: Diagnosis not present

## 2017-09-20 DIAGNOSIS — B20 Human immunodeficiency virus [HIV] disease: Secondary | ICD-10-CM | POA: Insufficient documentation

## 2017-09-20 DIAGNOSIS — D072 Carcinoma in situ of vagina: Secondary | ICD-10-CM

## 2017-09-20 DIAGNOSIS — Z90710 Acquired absence of both cervix and uterus: Secondary | ICD-10-CM | POA: Insufficient documentation

## 2017-09-20 DIAGNOSIS — E781 Pure hyperglyceridemia: Secondary | ICD-10-CM | POA: Diagnosis not present

## 2017-09-20 DIAGNOSIS — Z9889 Other specified postprocedural states: Secondary | ICD-10-CM | POA: Insufficient documentation

## 2017-09-20 DIAGNOSIS — D071 Carcinoma in situ of vulva: Secondary | ICD-10-CM | POA: Diagnosis not present

## 2017-09-20 NOTE — Patient Instructions (Signed)
Please notify Dr Andrey Farmerossi at [phone] if you notice new worrisome areas on the vulvar skin prior to see her in March, 2019.

## 2017-09-20 NOTE — Progress Notes (Signed)
Followup Note: Gyn-Onc   Cathy Smith 51 y.o. female  Chief Complaint  Patient presents with  . 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 S/p CO2 laser of vulva (circumferential, clitoris) with right WLE on 05/04/17 for VIN 3 (margins negative). LGSIL pap, high risk HPV infection, persistent VIN.  Plan:   Will see her back in March for vulvar inspection and pap smear.  Interval History  Her last Pap smear in March 2018 showed LGSIL + hrHPV.   She underwent WLE right vulva and CO2 laser of anterior vulva on 05/04/17. She has no complaints.  HPI: 51 year old African American female seen in consultation requested of [name], and Dr. Marice Potterove regarding management of a newly diagnosed severe dysplasia of the upper vagina. Briefly, the patient's history includes having a past history of abdominal hysterectomy for what we understand and have been fibroids. She denies any problems with Pap smears prior to the hysterectomy. More recently she has had some abnormal Pap smears ultimately resulting in colposcopy and directed biopsy on 04/07/2012. 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  The patient did well until March 2015 with a Pap smear showing high-grade dysplasia once again. In April and May 2015 the patient received 2 additional months of Efudex. From June 2015 through February 2016 her Pap smears only showed low-grade SIL.  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 She was scheduled for a wide local excision of the right anterior vulva which was performed on 05/06/15 and revealed VIN3. Margins were positive. Intraoperative evaluation showed lesions consistent with VIN3 on bilateral  posterior labia. These were biopsied (but not treated at the patient's request). These biopsies also returned as positive for VIN3.  On 07/23/15 she was taken to the OR for an extensive CO2 laser ablation of the circumferential labia minora and majora to ablate all acetowhite areas. No pathology was taken. The clitoris was spared.   Her VIN 3 recurred and on 02/25/16 she returned to the OR for CO2 laser which included the anterior vulva and clitoris.  Pap in March, 2018 showed LGSIL   Review of Systems:10 point review of systems is negative as noted above.   Vitals: Blood pressure 121/80, pulse 73, temperature 97.7 F (36.5 C), temperature source Oral, resp. rate 20, height 5' 2.01" (1.575 m), weight 152 lb 8 oz (69.2 kg), SpO2 100 %.  Physical Exam: General : The patient is a healthy woman in no acute distress.  HEENT: normocephalic, extraoccular movements normal; neck is supple without thyromegally  Lynphnodes: Supraclavicular and inguinal nodes not enlarged  Abdomen: Soft, non-tender, no ascites, no organomegally, no masses, no hernias  Pelvic:  EGBUS: 4% acetic acid applied. Subtle acetowhite changes to right labia minora and periclitoral tissues. No apparent high grade changes. 1cm firm condyloma at anterior suture line. Vagina: Normal, no lesions.  Urethra and Bladder: Normal, non-tender  Cervix: Surgically absent  Uterus: Surgically absent  Bi-manual examination: Non-tender; no adenxal masses or nodularity  Rectal: normal sphincter tone, no masses, no blood  Lower extremities: No edema or varicosities. Normal range of motion      No Known Allergies  Past Medical History:  Diagnosis Date  . History of condyloma acuminatum   . History of uterine  fibroid   . History of vaginal dysplasia    VAIN III--  oncologist-  dr Andrey Farmerrossi  . History of vulvar dysplasia    VIN III   gyn-oncologist-  dr Andrey Farmerrossi  . [facility] infection ([facility] hatcher  .  Hypertension   . Hypertriglyceridemia without hypercholesterolemia   . PONV (postoperative nausea and vomiting)   . VIN III (vulvar intraepithelial neoplasia III)   . Wears glasses     Past Surgical History:  Procedure Laterality Date  . ABDOMINAL HYSTERECTOMY  1995 approx  . ANTERIOR CERVICAL DECOMP/DISCECTOMY FUSION  11-30-2009   C4 -- C6  . COLONOSCOPY  01-28-2015  . I & D LEFT BUTTOCK ABSCESS  04-28-2001    Current Outpatient Medications  Medication Sig Dispense Refill  . GENVOYA 150-150-200-10 MG TABS tablet TAKE 1 TABLET BY MOUTH DAILY WITH BREAKFAST 30 tablet 4  . Multiple Vitamin (MULTIVITAMIN) tablet Take 1 tablet by mouth daily. Reported on 01/13/2016    . pravastatin (PRAVACHOL) 40 MG tablet TAKE 1 TABLET(40 MG) BY MOUTH DAILY (Patient taking differently: TAKE 1 TABLET(40 MG) BY MOUTH DAILY-- takes am) 90 tablet 0  . PREZISTA 800 MG tablet TAKE 1 TABLET(800 MG) BY MOUTH DAILY 30 tablet 4  . spironolactone (ALDACTONE) 25 MG tablet TAKE 1 TABLET BY MOUTH DAILY. (Patient taking differently: Take 25 mg by mouth every morning. TAKE 1 TABLET BY MOUTH DAILY.) 90 tablet 2  . SSD 1 % cream APPLY TOPICALLY AS DIRECTED, AFTER USING THE BATHROOM TO URINATE OR HAVE A BOWEL MOVEMENT 50 g 0   No current facility-administered medications for this visit.     Social History   Socioeconomic History  . Marital status: Married    Spouse name: Not on file  . Number of children: Not on file  . Years of education: Not on file  . Highest education level: Not on file  Social Needs  . Financial resource strain: Not on file  . Food insecurity - worry: Not on file  . Food insecurity - inability: Not on file  . Transportation needs - medical: Not on file  . Transportation needs - non-medical: Not on file  Occupational History  . Not on file  Tobacco Use  . Smoking status: Never Smoker  . Smokeless tobacco: Never Used  Substance and Sexual Activity  . Alcohol use: No    Alcohol/week: 0.0  oz  . Drug use: No  . Sexual activity: Yes    Partners: Male    Birth control/protection: Condom    Comment: pt. given condoms  Other Topics Concern  . Not on file  Social History Narrative  . Not on file    Family History  Problem Relation Age of Onset  . Cataracts Mother   . Hypertension Father   . Colon cancer Neg Hx       [name], Suni Jarnagin Caroline, MD 09/20/2017, 2:52 PM

## 2017-10-13 ENCOUNTER — Ambulatory Visit: Payer: Medicare HMO | Admitting: Family Medicine

## 2017-10-20 ENCOUNTER — Encounter: Payer: Self-pay | Admitting: Family Medicine

## 2017-10-20 ENCOUNTER — Ambulatory Visit (INDEPENDENT_AMBULATORY_CARE_PROVIDER_SITE_OTHER): Payer: Medicare HMO | Admitting: Family Medicine

## 2017-10-20 VITALS — BP 113/70 | HR 87 | Temp 98.0°F | Resp 16 | Ht 62.0 in | Wt 154.0 lb

## 2017-10-20 DIAGNOSIS — M79604 Pain in right leg: Secondary | ICD-10-CM | POA: Diagnosis not present

## 2017-10-20 DIAGNOSIS — R079 Chest pain, unspecified: Secondary | ICD-10-CM | POA: Diagnosis not present

## 2017-10-20 DIAGNOSIS — R7303 Prediabetes: Secondary | ICD-10-CM

## 2017-10-20 DIAGNOSIS — R52 Pain, unspecified: Secondary | ICD-10-CM | POA: Diagnosis not present

## 2017-10-20 DIAGNOSIS — M79605 Pain in left leg: Secondary | ICD-10-CM | POA: Diagnosis not present

## 2017-10-20 DIAGNOSIS — Z131 Encounter for screening for diabetes mellitus: Secondary | ICD-10-CM | POA: Diagnosis not present

## 2017-10-20 LAB — POCT GLYCOSYLATED HEMOGLOBIN (HGB A1C): HEMOGLOBIN A1C: 5.7

## 2017-10-20 NOTE — Progress Notes (Signed)
Cathy Smith, is a 51 y.o. female  NWG:[id number]  VHQ:[id number]  DOB - 1966-01-22  CC:  Chief Complaint  Patient presents with  . Follow-up    6 month    HPI: Cathy Smith is a 51 y.o. female is here today for chronic conditions follow-up. 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 She complains of bilateral aching of legs persisting for a little more than 5 days after engaging in yard work. She sustained no known injury.  Reports associated achhing all over her body at times. She has not attempted relief with medication. Pain resolves with rest. She attributes musculoskeletal pain to poor mattress quality.    Cathy Smith complains of new chest pain intermittently recurring with strenuous activity such as vacuuming, climbing stairs, with any "active" activity. Denies associated shortness of breath, dizziness, weakness, or headaches. Chest pain is relieved with rest. No history angina, CAD or CHF. History of hypertension although has remained well controlled.    No Known Allergies Past Medical History:  Diagnosis Date  . History of condyloma acuminatum   . History of uterine fibroid   . History of vaginal dysplasia    VAIN III--  oncologist-  dr Andrey Farmer  . History of vulvar dysplasia    VIN III   gyn-oncologist-  dr Andrey Farmer  . HIV infection ([facility]  dr hatcher  . Hypertension   . Hypertriglyceridemia without hypercholesterolemia   . PONV (postoperative nausea and vomiting)   . VIN III (vulvar intraepithelial neoplasia III)   . Wears glasses    Current Outpatient Medications on File Prior to Visit  Medication Sig Dispense Refill  . GENVOYA 150-150-200-10 MG TABS tablet TAKE 1 TABLET BY MOUTH DAILY WITH BREAKFAST 30 tablet 4  . Multiple Vitamin (MULTIVITAMIN) tablet Take 1 tablet by mouth daily. Reported on 01/13/2016    . pravastatin (PRAVACHOL) 40 MG tablet TAKE 1 TABLET(40 MG) BY MOUTH DAILY (Patient taking  differently: TAKE 1 TABLET(40 MG) BY MOUTH DAILY-- takes am) 90 tablet 0  . PREZISTA 800 MG tablet TAKE 1 TABLET(800 MG) BY MOUTH DAILY 30 tablet 4  . spironolactone (ALDACTONE) 25 MG tablet TAKE 1 TABLET BY MOUTH DAILY. (Patient taking differently: Take 25 mg by mouth every morning. TAKE 1 TABLET BY MOUTH DAILY.) 90 tablet 2  . SSD 1 % cream APPLY TOPICALLY AS DIRECTED, AFTER USING THE BATHROOM TO URINATE OR HAVE A BOWEL MOVEMENT 50 g 0   No current facility-administered medications on file prior to visit.    Family History  Problem Relation Age of Onset  . Cataracts Mother   . Hypertension Father   . Colon cancer Neg Hx    Social History   Socioeconomic History  . Marital status: Married    Spouse name: Not on file  . Number of children: Not on file  . Years of education: Not on file  . Highest education level: Not on file  Social Needs  . Financial resource strain: Not on file  . Food insecurity - worry: Not on file  . Food insecurity - inability: Not on file  . Transportation needs - medical: Not on file  . Transportation needs - non-medical: Not on file  Occupational History  . Not on file  Tobacco Use  . Smoking status: Never Smoker  . Smokeless tobacco: Never Used  Substance and Sexual Activity  . Alcohol use: No    Alcohol/week: 0.0 oz  . Drug use:  No  . Sexual activity: Yes    Partners: Male    Birth control/protection: Condom    Comment: pt. given condoms  Other Topics Concern  . Not on file  Social History Narrative  . Not on file    Review of Systems: Constitutional: Negative for fever, chills, diaphoresis, activity change, appetite change and fatigue. HENT: Negative for ear pain, nosebleeds, congestion, facial swelling, rhinorrhea, neck pain, neck stiffness and ear discharge.  Eyes: Negative for pain, discharge, redness, itching and visual disturbance. Respiratory: Negative for cough, choking, chest tightness, shortness of breath, wheezing and stridor.   Cardiovascular:  Positive  for chest pain. Negative for palpitations and leg swelling. Gastrointestinal: Negative for abdominal distention. Genitourinary: Negative for dysuria, urgency, frequency, hematuria, flank pain, decreased urine volume, difficulty urinating and dyspareunia.  Musculoskeletal: Negative for back pain, joint swelling, positive myalgia. Negative arthralgia and gait problem. Neurological: Negative for dizziness, tremors, seizures, syncope, facial asymmetry, speech difficulty, weakness, light-headedness, numbness and headaches.  Hematological: Negative for adenopathy. Does not bruise/bleed easily. Psychiatric/Behavioral: Negative for hallucinations, behavioral problems, confusion, dysphoric mood, decreased concentration and agitation.    Objective:   Vitals:   10/20/17 1443  BP: 113/70  Pulse: 87  Resp: 16  Temp: 98 F (36.7 C)  SpO2: 99%    Physical Exam: Constitutional: Patient appears well-developed and well-nourished. No distress. HENT: Normocephalic, atraumatic, External right and left ear normal. Oropharynx is clear and moist.  Eyes: Conjunctivae and EOM are normal. PERRLA, no scleral icterus. Neck: Normal ROM. Neck supple. No JVD. No tracheal deviation. No thyromegaly. CVS: RRR, S1/S2 +, no murmurs, no gallops, no carotid bruit.  Pulmonary: Effort and breath sounds normal, no stridor, rhonchi, wheezes, rales.  Abdominal: Soft. BS +, no distension, tenderness, rebound or guarding.  Musculoskeletal: Normal range of motion. No edema and no tenderness.  Lymphadenopathy: No lymphadenopathy noted, cervical, inguinal or axillary Neuro: Alert. Normal reflexes, muscle tone coordination. No cranial nerve deficit. Skin: Skin is warm and dry. No rash noted. Not diaphoretic. No erythema. No pallor. Psychiatric: Normal mood and affect. Behavior, judgment, thought content normal.  Lab Results  Component Value Date   WBC 3.4 (L) 04/13/2017   HGB 12.7 04/13/2017   HCT  37.7 04/13/2017   MCV 96.2 04/13/2017   PLT 190 04/13/2017   Lab Results  Component Value Date   CREATININE 0.96 04/13/2017   BUN 13 04/13/2017   NA 137 04/13/2017   K 4.2 04/13/2017   CL 103 04/13/2017   CO2 19 (L) 04/13/2017    Lab Results  Component Value Date   HGBA1C 5.7 (H) 04/13/2017   Lipid Panel     Component Value Date/Time   CHOL 176 01/27/2017 1412   TRIG 267 (H) 01/27/2017 1412   HDL 36 (L) 01/27/2017 1412   CHOLHDL 4.9 01/27/2017 1412   VLDL 53 (H) 01/27/2017 1412   LDLCALC 87 01/27/2017 1412        Assessment and plan:  1. Prediabetes, Hb A1C-improved today   - Basic metabolic panel  2. Body aches, resolves with nonpharmacologic interventions. Checking a Vitamin D level to ensure no deficiency exist.   Recommended a trial of OTC acetaminophen if symptoms persist.    3. Pain in both lower extremities,  resolves with nonpharmacologic interventions. Checking a Vitamin D level to ensure no deficiency exist. Recommended a trial of OTC acetaminophen if symptoms persist.    4. Chest pain, unspecified type, EKG unchanged from prior NSR , left axis deviation, and possible  anterolateral infarct. Chest pain with exertion is new and ongoing for last few weeks. Resolves with rest. Will refer to cardiology for second opinion as this is a new problem.    Orders Placed This Encounter  Procedures  . CBC  . Basic metabolic panel  . VITAMIN D 25 Hydroxy (Vit-D Deficiency, Fractures)  . Ambulatory referral to Cardiology  . POCT glycosylated hemoglobin (Hb A1C)  . EKG 12-Lead    Return in about 6 months (around 04/20/2018), or chronic conditions .     Godfrey PickKimberly S. Tiburcio PeaHarris, MSN, FNP-C [facility]  [address] [place], [place] [address] [phone]   The patient was given clear instructions to go to ER or return to medical center if symptoms don't improve, worsen or new problems develop. The patient verbalized understanding.  The patient was told to call to get lab results if they haven't heard anything in the next week.     This note has been created with Education officer, environmentalDragon speech recognition software and smart phrase technology. Any transcriptional errors are unintentional.

## 2017-10-21 LAB — CBC
Hematocrit: 36.3 % (ref 34.0–46.6)
Hemoglobin: 12.7 g/dL (ref 11.1–15.9)
MCH: 32.1 pg (ref 26.6–33.0)
MCHC: 35 g/dL (ref 31.5–35.7)
MCV: 92 fL (ref 79–97)
PLATELETS: 218 10*3/uL (ref 150–379)
RBC: 3.96 x10E6/uL (ref 3.77–5.28)
RDW: 13 % (ref 12.3–15.4)
WBC: 4.5 10*3/uL (ref 3.4–10.8)

## 2017-10-21 LAB — VITAMIN D 25 HYDROXY (VIT D DEFICIENCY, FRACTURES): VIT D 25 HYDROXY: 18.6 ng/mL — AB (ref 30.0–100.0)

## 2017-10-21 LAB — BASIC METABOLIC PANEL
BUN / CREAT RATIO: 14 (ref 9–23)
BUN: 13 mg/dL (ref 6–24)
CALCIUM: 9.7 mg/dL (ref 8.7–10.2)
CHLORIDE: 101 mmol/L (ref 96–106)
CO2: 24 mmol/L (ref 20–29)
Creatinine, Ser: 0.96 mg/dL (ref 0.57–1.00)
GFR calc Af Amer: 78 mL/min/{1.73_m2} (ref >59)
GFR calc non Af Amer: 68 mL/min/{1.73_m2} (ref >59)
GLUCOSE: 99 mg/dL (ref 65–99)
POTASSIUM: 4.2 mmol/L (ref 3.5–5.2)
Sodium: 143 mmol/L (ref 134–144)

## 2017-10-26 ENCOUNTER — Telehealth: Payer: Self-pay | Admitting: Family Medicine

## 2017-10-26 DIAGNOSIS — E559 Vitamin D deficiency, unspecified: Secondary | ICD-10-CM

## 2017-10-26 MED ORDER — VITAMIN D (ERGOCALCIFEROL) 1.25 MG (50000 UNIT) PO CAPS: 50000.0000 [IU] | ORAL_CAPSULE | ORAL | 1 refills | Status: DC

## 2017-10-26 NOTE — Telephone Encounter (Signed)
Contact patient to advise labs were consistent with baseline however her vitamin d level is low. I have sent a prescription for vitamin D replacement [id number] units once weekly. I will recheck her vitamin d level at her next follow-up.

## 2017-10-27 NOTE — Telephone Encounter (Signed)
Patient notified of results.

## 2017-11-12 ENCOUNTER — Ambulatory Visit: Payer: Medicare HMO | Admitting: Cardiovascular Disease

## 2017-11-12 ENCOUNTER — Encounter: Payer: Self-pay | Admitting: Cardiovascular Disease

## 2017-11-12 DIAGNOSIS — E781 Pure hyperglyceridemia: Secondary | ICD-10-CM | POA: Diagnosis not present

## 2017-11-12 DIAGNOSIS — I208 Other forms of angina pectoris: Secondary | ICD-10-CM | POA: Diagnosis not present

## 2017-11-12 DIAGNOSIS — R079 Chest pain, unspecified: Secondary | ICD-10-CM | POA: Insufficient documentation

## 2017-11-12 NOTE — Assessment & Plan Note (Signed)
Ms. Cathy Smith was referred to me by Dr. Tiburcio PeaHarris for evaluation of new onset atypical chest pain. She's had this for the last 3-4 months. Occurs several times a week lasting up to 5-10 minutes a time associated with shortness of breath. And then again exercise Myoview stress test to further evaluate.

## 2017-11-12 NOTE — Assessment & Plan Note (Signed)
History of hyperlipidemia on pravastatin with recent lipid profile performed 01/27/17 revealing total cholesterol 176, LDL of 87 and HDL of 36. Her triglyceride level was elevated at 267.

## 2017-11-12 NOTE — Progress Notes (Signed)
11/12/2017 Cathy Smith   06/23/1966  [id number]  Primary Physician Bing NeighborsHarris, Kimberly S, FNP Primary Cardiologist: Runell GessJonathan J Marcelus Dubberly MD Nicholes CalamityFACP, FACC, FAHA, [place]  HPI:  Cathy DillingRenee C Smith is a 52 y.o. moderately overweight married African-American female with no children. Does not work. She was referred to me by Joaquin CourtsKimberly Harris FNP for new onset chest pain. She really has no cardiac risk factors other than hypertension hyperlipidemia both of which are treated. There is no family history. She does not smoke. She had onset of chest pain 3-4 months ago,, currently trying to 3 times a week lasting up to 5-10 minutes at a time associated with shortness of breath.    Current Meds  Medication Sig  . GENVOYA 150-150-200-10 MG TABS tablet TAKE 1 TABLET BY MOUTH DAILY WITH BREAKFAST  . Multiple Vitamin (MULTIVITAMIN) tablet Take 1 tablet by mouth daily. Reported on 01/13/2016  . pravastatin (PRAVACHOL) 40 MG tablet TAKE 1 TABLET(40 MG) BY MOUTH DAILY (Patient taking differently: TAKE 1 TABLET(40 MG) BY MOUTH DAILY-- takes am)  . PREZISTA 800 MG tablet TAKE 1 TABLET(800 MG) BY MOUTH DAILY  . spironolactone (ALDACTONE) 25 MG tablet TAKE 1 TABLET BY MOUTH DAILY. (Patient taking differently: Take 25 mg by mouth every morning. TAKE 1 TABLET BY MOUTH DAILY.)  . SSD 1 % cream APPLY TOPICALLY AS DIRECTED, AFTER USING THE BATHROOM TO URINATE OR HAVE A BOWEL MOVEMENT  . Vitamin D, Ergocalciferol, (DRISDOL) 50000 units CAPS capsule Take 1 capsule (50,000 Units total) by mouth every 7 (seven) days.     No Known Allergies  Social History   Socioeconomic History  . Marital status: Married    Spouse name: Not on file  . Number of children: Not on file  . Years of education: Not on file  . Highest education level: Not on file  Social Needs  . Financial resource strain: Not on file  . Food insecurity - worry: Not on file  . Food insecurity - inability: Not on file  . Transportation needs - medical: Not on file  .  Transportation needs - non-medical: Not on file  Occupational History  . Not on file  Tobacco Use  . Smoking status: Never Smoker  . Smokeless tobacco: Never Used  Substance and Sexual Activity  . Alcohol use: No    Alcohol/week: 0.0 oz  . Drug use: No  . Sexual activity: Yes    Partners: Male    Birth control/protection: Condom    Comment: pt. given condoms  Other Topics Concern  . Not on file  Social History Narrative  . Not on file     Review of Systems: General: negative for chills, fever, night sweats or weight changes.  Cardiovascular: negative for chest pain, dyspnea on exertion, edema, orthopnea, palpitations, paroxysmal nocturnal dyspnea or shortness of breath Dermatological: negative for rash Respiratory: negative for cough or wheezing Urologic: negative for hematuria Abdominal: negative for nausea, vomiting, diarrhea, bright red blood per rectum, melena, or hematemesis Neurologic: negative for visual changes, syncope, or dizziness All other systems reviewed and are otherwise negative except as noted above.    Blood pressure (!) 131/91, pulse 89, height 5\' 1"  (1.549 m), weight 151 lb 12.8 oz (68.9 kg), SpO2 98 %.  General appearance: alert and no distress Neck: no adenopathy, no carotid bruit, no JVD, supple, symmetrical, trachea midline and thyroid not enlarged, symmetric, no tenderness/mass/nodules Lungs: clear to auscultation bilaterally Heart: regular rate and rhythm, S1, S2 normal, no murmur,  click, rub or gallop Extremities: extremities normal, atraumatic, no cyanosis or edema Pulses: 2+ and symmetric Skin: Skin color, texture, turgor normal. No rashes or lesions Neurologic: Alert and oriented X 3, normal strength and tone. Normal symmetric reflexes. Normal coordination and gait  EKG not performed today  ASSESSMENT AND PLAN:   Chest pain Cathy Smith was referred to me by Dr. Tiburcio Pea for evaluation of new onset atypical chest pain. She's had this for the  last 3-4 months. Occurs several times a week lasting up to 5-10 minutes a time associated with shortness of breath. And then again exercise Myoview stress test to further evaluate.  Essential hypertension History of essential hypertension blood pressure measured at 131/91. He is on spironolactone.  Hypertriglyceridemia without hypercholesterolemia History of hyperlipidemia on pravastatin with recent lipid profile performed 01/27/17 revealing total cholesterol 176, LDL of 87 and HDL of 36. Her triglyceride level was elevated at 267.      Runell Gess MD FACP,FACC,FAHA, [facility] 11/12/2017 12:11 PM

## 2017-11-12 NOTE — Assessment & Plan Note (Signed)
History of essential hypertension blood pressure measured at 131/91. He is on spironolactone.

## 2017-11-12 NOTE — Patient Instructions (Signed)
Medication Instructions: Your physician recommends that you continue on your current medications as directed. Please refer to the Current Medication list given to you today.   Testing/Procedures: Your physician has requested that you have an exercise stress myoview. For further information please visit [email or web address]. Please follow instruction sheet, as given.  Follow-Up: Your physician recommends that you schedule a follow-up appointment as needed with Dr. Allyson SabalBerry unless abnormal test results.

## 2017-11-16 ENCOUNTER — Telehealth (HOSPITAL_COMMUNITY): Payer: Self-pay | Admitting: *Deleted

## 2017-11-16 NOTE — Telephone Encounter (Signed)
Close encounter 

## 2017-11-17 ENCOUNTER — Encounter (HOSPITAL_COMMUNITY): Payer: Self-pay | Admitting: Emergency Medicine

## 2017-11-17 ENCOUNTER — Telehealth: Payer: Self-pay | Admitting: Cardiovascular Disease

## 2017-11-17 ENCOUNTER — Emergency Department (HOSPITAL_COMMUNITY)
Admission: EM | Admit: 2017-11-17 | Discharge: 2017-11-18 | Disposition: A | Discharge status: Home / Self Care | Payer: Medicare HMO | Attending: Emergency Medicine | Admitting: Emergency Medicine

## 2017-11-17 ENCOUNTER — Other Ambulatory Visit: Payer: Self-pay

## 2017-11-17 DIAGNOSIS — I208 Other forms of angina pectoris: Secondary | ICD-10-CM | POA: Diagnosis not present

## 2017-11-17 DIAGNOSIS — Z79899 Other long term (current) drug therapy: Secondary | ICD-10-CM | POA: Insufficient documentation

## 2017-11-17 DIAGNOSIS — I10 Essential (primary) hypertension: Secondary | ICD-10-CM | POA: Diagnosis not present

## 2017-11-17 DIAGNOSIS — Z7722 Contact with and (suspected) exposure to environmental tobacco smoke (acute) (chronic): Secondary | ICD-10-CM | POA: Insufficient documentation

## 2017-11-17 DIAGNOSIS — T452X1A Poisoning by vitamins, accidental (unintentional), initial encounter: Secondary | ICD-10-CM | POA: Diagnosis not present

## 2017-11-17 LAB — CBC
HEMATOCRIT: 40.1 % (ref 36.0–46.0)
HEMOGLOBIN: 13.2 g/dL (ref 12.0–15.0)
MCH: 31.7 pg (ref 26.0–34.0)
MCHC: 32.9 g/dL (ref 30.0–36.0)
MCV: 96.4 fL (ref 78.0–100.0)
Platelets: 220 10*3/uL (ref 150–400)
RBC: 4.16 MIL/uL (ref 3.87–5.11)
RDW: 12 % (ref 11.5–15.5)
WBC: 5.2 10*3/uL (ref 4.0–10.5)

## 2017-11-17 LAB — COMPREHENSIVE METABOLIC PANEL
ALBUMIN: 4.6 g/dL (ref 3.5–5.0)
ALT: 27 U/L (ref 14–54)
ANION GAP: 10 (ref 5–15)
AST: 30 U/L (ref 15–41)
Alkaline Phosphatase: 69 U/L (ref 38–126)
BILIRUBIN TOTAL: 0.7 mg/dL (ref 0.3–1.2)
BUN: 10 mg/dL (ref 6–20)
CO2: 26 mmol/L (ref 22–32)
Calcium: 10 mg/dL (ref 8.9–10.3)
Chloride: 101 mmol/L (ref 101–111)
Creatinine, Ser: 0.84 mg/dL (ref 0.44–1.00)
GFR calc Af Amer: >60 mL/min (ref >60)
GFR calc non Af Amer: >60 mL/min (ref >60)
GLUCOSE: 105 mg/dL — AB (ref 65–99)
POTASSIUM: 3.9 mmol/L (ref 3.5–5.1)
SODIUM: 137 mmol/L (ref 135–145)
TOTAL PROTEIN: 8.8 g/dL — AB (ref 6.5–8.1)

## 2017-11-17 LAB — PHOSPHORUS: Phosphorus: 4.4 mg/dL (ref 2.5–4.6)

## 2017-11-17 NOTE — ED Notes (Signed)
This RN spoke with poison control as a follow-up. Poison control advises that pt needs to have a Vit. D level drawn and EKG performed.

## 2017-11-17 NOTE — Telephone Encounter (Signed)
Pt says she thinks the reason she have been having chest pains is because she took too much Vitamin D. She was supposed to have been taking them every 7 days and she was taking them every day.She wants to know if she should still have the Stress Test tomorrow?Please let her know asap.

## 2017-11-17 NOTE — ED Notes (Signed)
This RN spoke with poison control who advised to check Vit D, calcium and phos levels. Per poison control if results are WDL, pt can be disharged.

## 2017-11-17 NOTE — ED Provider Notes (Signed)
Patient placed in Quick Look pathway, seen and evaluated for chief complaint of ingestion. Took Vitamin D medication daily rather than once per week. Did this x 1 week. Denies symptoms.  Pertinent H&P findings include No CP/SOB/ABD pain. No N/V. No back pain. Sent by PCP with concern for Vitamin D Toxicity.  Based on initial evaluation, labs are indicated and radiology studies are not indicated.  Patient counseled on process, plan, and necessity for staying for completing the evaluation.    Cathy Smith, Cathy Wetherington, PA-C 11/17/17 1745    Margarita Grizzleay, Danielle, MD 11/19/17 [phone]

## 2017-11-17 NOTE — ED Triage Notes (Signed)
Pt reports taking vit D 50,000 IU  daily rather than weekly as ordered.  Pt reports taking med daily x 13 days.  Pt reports CP ongoing x one month worse with exertion, reports having stress test scheduled for tomorrow.  Pt denies CP, SOB, n/v at this time.

## 2017-11-17 NOTE — Telephone Encounter (Signed)
Patient advise to go to ED as she is having chest pain. Patient was told to stop taking the vitamin D and go to stress test tomorrow if she is cleared by ED today. Patient was taking vitamin D everyday instead of taking it every 7 days.

## 2017-11-17 NOTE — Telephone Encounter (Signed)
Returned call to patient.[facility].She is not having chest pain at present.

## 2017-11-17 NOTE — ED Provider Notes (Signed)
Cathy Smith Provider Note   CSN: [id number] Arrival date & time: 11/17/17  1616     History   Chief Complaint Chief Complaint  Patient presents with  . Ingestion    HPI Cathy Smith is a 52 y.o. female presenting for evaluation after ingesting too much vitamin D.  Patient states she has been taking her vitamin D daily as opposed to every 7 days.  This is been going on for the past 1-2 weeks.  Additionally, she reports intermittent chest pain, worse with exertion.  This is been going on for the past 6 weeks.  She has been evaluated in the past for CP and been instructed to follow-up with cardiology.  She has an appointment with cardiology for stress test tomorrow.  She talked to her cardiologist's office this afternoon, who told her to come to the emergency room to be evaluated for vitamin D toxicity, and if she was cleared, she can follow-up with a stress test tomorrow.  She denies new fevers, chills, sore throat, cough, chest pain, shortness of breath, nausea, vomiting, abdominal pain, urinary symptoms, abnormal bowel movements.  RN discussed with poison control, who recommended EKG, vitamin D, calcium, and phosphorus levels be obtained for clearance.   HPI  Past Medical History:  Diagnosis Date  . History of condyloma acuminatum   . History of uterine fibroid   . History of vaginal dysplasia    VAIN III--  oncologist-  dr Andrey Farmerrossi  . History of vulvar dysplasia    VIN III   gyn-oncologist-  dr Andrey Farmerrossi  . HIV infection (HCC) [facility]-  dr hatcher  . Hypertension   . Hypertriglyceridemia without hypercholesterolemia   . PONV (postoperative nausea and vomiting)   . VIN III (vulvar intraepithelial neoplasia III)   . Wears glasses     Patient Active Problem List   Diagnosis Date Noted  . Chest pain 11/12/2017  . LGSIL Pap smear of vagina 04/28/2017  . 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 Sinusitis 12/30/2015    . History of fusion of cervical spine 08/27/2015  . 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 Physical exam, annual 09/06/2014  . Need for Tdap vaccination 09/06/2014  . Alopecia of scalp 09/06/2014  . Urinary tract infection symptoms 08/15/2014  . Hypertriglyceridemia without hypercholesterolemia 08/08/2014  . Other and unspecified hyperlipidemia 01/23/2013  . Dysuria 07/13/2012  . Abnormal cervical Papanicolaou smear 10/22/2011  . Essential hypertension 06/12/2009  . MONILIASIS, ORAL 09/24/2008  . HX, PERSONAL, PAST NONCOMPLIANCE 11/24/2006  . APHTHOUS ULCERS 08/18/2006  . Human immunodeficiency virus (HIV) disease (HCC) 09/15/2000  . HYSTERECTOMY, HX OF 11/10/1983    Past Surgical History:  Procedure Laterality Date  . ABDOMINAL HYSTERECTOMY  1995 approx  . ANTERIOR CERVICAL DECOMP/DISCECTOMY FUSION  11-30-2009   C4 -- C6  . CO2 LASER APPLICATION Bilateral 07/23/2015   Procedure: BILATERAL CO2 LASER ABLATION OF THE VULVA;  Surgeon: Adolphus BirchwoodEmma Rossi, MD;  [facility];  Service: Gynecology;  Laterality: Bilateral;  . CO2 LASER APPLICATION N/A 02/25/2016   Procedure: CO2 LASER OF THE VULVAR;  Surgeon: Adolphus BirchwoodEmma Rossi, MD;  [facility];  Service: Gynecology;  Laterality: N/A;  . CO2 LASER APPLICATION N/A 05/04/2017   Procedure: CO2 LASER VAPORIZATION OF THE VULVA;  Surgeon: Adolphus Birchwoodossi, Emma, MD;  [facility];  Service: Gynecology;  Laterality: N/A;  . COLONOSCOPY  01-28-2015  . I & D LEFT  BUTTOCK ABSCESS  04-28-2001  . VULVECTOMY Right 05/06/2015   Procedure: WIDE LOCAL  EXCISION  OF RIGHT VULVA;  Surgeon: Adolphus Birchwood, MD;  Location[facility] [place];  Service: Gynecology;  Laterality: Right;  Marland Kitchen VULVECTOMY N/A 05/04/2017   Procedure: WIDE EXCISION VULVECTOMY;  Surgeon: Adolphus Birchwood, MD;  Location: [facility];  Service: Gynecology;  Laterality: N/A;    OB History    Gravida Para Term  Preterm AB Living   1       1     SAB TAB Ectopic Multiple Live Births     1             Home Medications    Prior to Admission medications   Medication Sig Start Date End Date Taking? Authorizing Provider  GENVOYA 150-150-200-10 MG TABS tablet TAKE 1 TABLET BY MOUTH DAILY WITH BREAKFAST 09/08/17   Ginnie Smart, MD  Multiple Vitamin (MULTIVITAMIN) tablet Take 1 tablet by mouth daily. Reported on 01/13/2016    [provider]  pravastatin (PRAVACHOL) 40 MG tablet TAKE 1 TABLET(40 MG) BY MOUTH DAILY Patient taking differently: TAKE 1 TABLET(40 MG) BY MOUTH DAILY-- takes am 01/11/17   Henrietta Hoover, NP  PREZISTA 800 MG tablet TAKE 1 TABLET(800 MG) BY MOUTH DAILY 09/08/17   Ginnie Smart, MD  spironolactone (ALDACTONE) 25 MG tablet TAKE 1 TABLET BY MOUTH DAILY. Patient taking differently: Take 25 mg by mouth every morning. TAKE 1 TABLET BY MOUTH DAILY. 04/13/17   Bing Neighbors, FNP  SSD 1 % cream APPLY TOPICALLY AS DIRECTED, AFTER USING THE BATHROOM TO URINATE OR HAVE A BOWEL MOVEMENT 05/25/17   Cross, Efraim Kaufmann D, NP  Vitamin D, Ergocalciferol, (DRISDOL) 50000 units CAPS capsule Take 1 capsule (50,000 Units total) by mouth every 7 (seven) days. 10/26/17   Bing Neighbors, FNP    Family History Family History  Problem Relation Age of Onset  . Cataracts Mother   . Hypertension Father   . Colon cancer Neg Hx     Social History Social History   Tobacco Use  . Smoking status: Never Smoker  . Smokeless tobacco: Never Used  Substance Use Topics  . Alcohol use: No    Alcohol/week: 0.0 oz  . Drug use: No     Allergies   Patient has no known allergies.   Review of Systems Review of Systems  Respiratory: Negative for shortness of breath.   Cardiovascular: Negative for chest pain.  Gastrointestinal: Negative for abdominal pain, nausea and vomiting.     Physical Exam Updated Vital Signs BP 137/90 (BP Location: Left Arm)   Pulse 91   Temp 98.8 F  (37.1 C) (Oral)   Resp 18   SpO2 99%   Physical Exam  Constitutional: She is oriented to person, place, and time. She appears well-developed and well-nourished. No distress.  HENT:  Head: Normocephalic and atraumatic.  Eyes: EOM are normal.  Neck: Normal range of motion.  Cardiovascular: Normal rate, regular rhythm and intact distal pulses.  Pulmonary/Chest: Effort normal and breath sounds normal. No respiratory distress. She has no wheezes.  Abdominal: Soft. She exhibits no distension and no mass. There is no tenderness. There is no guarding.  Musculoskeletal: Normal range of motion.  Neurological: She is alert and oriented to person, place, and time.  Skin: Skin is warm. No rash noted.  Psychiatric: She has a normal mood and affect.  Nursing note and vitals reviewed.    ED Treatments /  Results  Labs (all labs ordered are listed, but only abnormal results are displayed) Labs Reviewed  COMPREHENSIVE METABOLIC PANEL - Abnormal; Notable for the following components:      Result Value   Glucose, Bld 105 (*)    Total Protein 8.8 (*)    All other components within normal limits  CBC  PHOSPHORUS  CALCITRIOL (1,25 DI-OH VIT D)    EKG  EKG Interpretation  Date/Time:  Wednesday November 17 2017 21:37:34 EST Ventricular Rate:  94 PR Interval:  162 QRS Duration: 74 QT Interval:  352 QTC Calculation: 440 R Axis:   -39 Text Interpretation:  Normal sinus rhythm Left axis deviation Low voltage QRS Septal infarct , age undetermined Abnormal ECG Low voltage QRS When compared with ECG of 05/04/2017, No significant change was found Confirmed by Dione Booze ([id number]) on 11/17/2017 11:08:37 PM       Radiology No results found.  Procedures Procedures (including critical care time)  Medications Ordered in ED Medications - No data to display   Initial Impression / Assessment and Plan / ED Course  I have reviewed the triage vital signs and the nursing notes.  Pertinent labs &  imaging results that were available during my care of the patient were reviewed by me and considered in my medical decision making (see chart for details).     Patient presenting for evaluation after taking too much vitamin D.  Physical exam reassuring, no acute abnormality.  History reassuring, patient has not had any new symptoms since beginning the vitamin D.  She reports 6 weeks of chest pain.  There is been no change in this since she has been taking vitamin D incorrectly.  Labs show no change in LFTs, calcium and phosphorus are normal.  EKG unchanged from last tracing.  Case discussed with attending, Dr. Preston Fleeting evaluated the patient.  Encouraged follow-up with cardiology tomorrow and with primary care for continued leg pain.  At this time, patient appears safe for discharge.  Return precautions given.  Patient states she understands and agrees to plan.   Final Clinical Impressions(s) / ED Diagnoses   Final diagnoses:  Vitamin D overdose, accidental or unintentional, initial encounter    ED Discharge Orders    None       Alveria Apley, PA-C 11/18/17 0034    Dione Booze, MD 11/18/17 [phone]

## 2017-11-18 ENCOUNTER — Ambulatory Visit (HOSPITAL_BASED_OUTPATIENT_CLINIC_OR_DEPARTMENT_OTHER)
Admission: RE | Admit: 2017-11-18 | Discharge: 2017-11-18 | Disposition: A | Discharge status: Home / Self Care | Payer: Medicare HMO | Source: Ambulatory Visit | Attending: Cardiology | Admitting: Cardiology

## 2017-11-18 DIAGNOSIS — I208 Other forms of angina pectoris: Secondary | ICD-10-CM

## 2017-11-18 LAB — MYOCARDIAL PERFUSION IMAGING
CHL CUP MPHR: 167 {beats}/min
CHL CUP NUCLEAR SSS: 19
CHL CUP RESTING HR STRESS: 83 {beats}/min
CHL RATE OF PERCEIVED EXERTION: 18
CSEPED: 6 min
CSEPEW: 7.5 METS
CSEPHR: 89 %
Exercise duration (sec): 30 s
LV dias vol: 56 mL (ref 46–106)
LV sys vol: 18 mL
Peak HR: 148 {beats}/min
SDS: 17
SRS: 2
TID: 1.33

## 2017-11-18 MED ORDER — TECHNETIUM TC 99M TETROFOSMIN IV KIT
10.3000 | PACK | Freq: Once | INTRAVENOUS | Status: AC | PRN
  Administered 2017-11-18: 10.3 via INTRAVENOUS
  Filled 2017-11-18: qty 11

## 2017-11-18 MED ORDER — TECHNETIUM TC 99M TETROFOSMIN IV KIT
28.4000 | PACK | Freq: Once | INTRAVENOUS | Status: AC | PRN
  Administered 2017-11-18: 28.4 via INTRAVENOUS
  Filled 2017-11-18: qty 29

## 2017-11-18 NOTE — ED Notes (Signed)
E signature pad unavailable. Pt verbalizes understanding of dc instructions  

## 2017-11-18 NOTE — Discharge Instructions (Signed)
Follow-up with your cardiologist tomorrow for the stress test and for further evaluation of your chest pain. Follow up with your primary care doctor for further evaluation of your leg cramps and body aches. Return to the emergency room if you develop persistent chest pain, difficulty breathing, vomiting, abdominal pain, or any new or concerning symptoms.

## 2017-11-19 ENCOUNTER — Other Ambulatory Visit: Payer: Self-pay

## 2017-11-19 ENCOUNTER — Encounter: Payer: Self-pay | Admitting: Cardiovascular Disease

## 2017-11-19 ENCOUNTER — Ambulatory Visit
Admission: RE | Admit: 2017-11-19 | Discharge: 2017-11-19 | Disposition: A | Discharge status: Home / Self Care | Payer: Medicare HMO | Source: Ambulatory Visit | Attending: Cardiovascular Disease | Admitting: Cardiovascular Disease

## 2017-11-19 ENCOUNTER — Other Ambulatory Visit: Payer: Self-pay | Admitting: Cardiovascular Disease

## 2017-11-19 ENCOUNTER — Ambulatory Visit: Payer: Medicare HMO | Admitting: Cardiovascular Disease

## 2017-11-19 VITALS — BP 149/93 | HR 102 | Ht 61.0 in | Wt 149.8 lb

## 2017-11-19 DIAGNOSIS — R072 Precordial pain: Secondary | ICD-10-CM

## 2017-11-19 DIAGNOSIS — R9439 Abnormal result of other cardiovascular function study: Secondary | ICD-10-CM

## 2017-11-19 DIAGNOSIS — R079 Chest pain, unspecified: Secondary | ICD-10-CM | POA: Diagnosis not present

## 2017-11-19 DIAGNOSIS — Z01818 Encounter for other preprocedural examination: Secondary | ICD-10-CM | POA: Diagnosis not present

## 2017-11-19 DIAGNOSIS — E785 Hyperlipidemia, unspecified: Secondary | ICD-10-CM

## 2017-11-19 LAB — CALCITRIOL (1,25 DI-OH VIT D): Vit D, 1,25-Dihydroxy: 63.1 pg/mL (ref 19.9–79.3)

## 2017-11-19 MED ORDER — PRAVASTATIN SODIUM 40 MG PO TABS: ORAL_TABLET | ORAL | 0 refills | Status: DC

## 2017-11-19 NOTE — Assessment & Plan Note (Signed)
Ms. Cathy DakinRiley returns today after having had a Myoview stress test performed yesterday and the diagnosis of chest pain. This was read as high risk with a large defect that was ischemic in nature in her apical location. Based on this, I recommended that she undergo elective left heart cath via the right radial approach on Monday. The patient understands that risks included but are not limited to stroke (1 in 1000), death (1 in 1000), kidney failure [usually temporary] (1 in 500), bleeding (1 in 200), allergic reaction [possibly serious] (1 in 200). The patient understands and agrees to proceed

## 2017-11-19 NOTE — Progress Notes (Signed)
Ms. Cathy Smith returns today for follow-up of her Myoview stress test performed yesterday which was read as high risk. 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 Patient does have recommended left heart cath on Monday via the right radial approach.  The patient understands that risks included but are not limited to stroke (1 in 1000), death (1 in 1000), kidney failure [usually temporary] (1 in 500), bleeding (1 in 200), allergic reaction [possibly serious] (1 in 200). The patient understands and agrees to proceed  Runell GessJonathan J. Berry, M.D., FACP, [facility]FACC, Kathryne ErikssonFAHA, FSCAI [facility] [address] [place], [place]  [address]  [phone] 11/19/2017 12:04 PM

## 2017-11-19 NOTE — H&P (View-Only) (Signed)
Cathy Smith returns today for follow-up of her Myoview stress test performed yesterday which was read as high risk. 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 Patient does have recommended left heart cath on Monday via the right radial approach.  The patient understands that risks included but are not limited to stroke (1 in 1000), death (1 in 1000), kidney failure [usually temporary] (1 in 500), bleeding (1 in 200), allergic reaction [possibly serious] (1 in 200). The patient understands and agrees to proceed  Cathy Smith J. Elliette Seabolt, M.D., FACP, FACC, FAHA, FSCAI [facility] [address] [place], [place]  [address]  [phone] 11/19/2017 12:04 PM  

## 2017-11-19 NOTE — Patient Instructions (Signed)
   [facility] [address] [place] [address] Dept: [phone] Loc: [phone]00  Cathy DillingRenee C Smith  11/19/2017  You are scheduled for a Cardiac Catheterization on Monday, January 14 with Dr. Nanetta BattyJonathan Smith.  1. Please arrive at the [facility] at [facility]: [address] [place], [place] [address] at 11:30 AM (two hours before your procedure to ensure your preparation). Free valet parking service is available.   Special note: Every effort is made to have your procedure done on time. Please understand that emergencies sometimes delay scheduled procedures.  2. Diet: Do not eat or drink anything after midnight prior to your procedure except sips of water to take medications.  3. Labs: Please have labs drawn in our office today.  --A chest x-ray takes a picture of the organs and structures inside the chest, including the heart, lungs, and blood vessels. This test can show several things, including, whether the heart is enlarges; whether fluid is building up in the lungs; and whether pacemaker / defibrillator leads are still in place.   4. Medication instructions in preparation for your procedure:  On the morning of your procedure, take any morning medicines.  You may use sips of water.  5. Plan for one night stay--bring personal belongings. 6. Bring a current list of your medications and current insurance cards. 7. You MUST have a responsible person to drive you home. 8. Someone MUST be with you the first 24 hours after you arrive home or your discharge will be delayed. 9. Please wear clothes that are easy to get on and off and wear slip-on shoes.  Thank you for allowing us to care for you!   -- [facility]  Post-procedure Follow-up:  Your physician recommends that you schedule a follow-up appointment in: 1-2 weeks after 11/22/17 with  Dr. Allyson SabalBerry.

## 2017-11-20 LAB — CBC WITH DIFFERENTIAL/PLATELET
BASOS: 1 %
Basophils Absolute: 0 10*3/uL (ref 0.0–0.2)
EOS (ABSOLUTE): 0 10*3/uL (ref 0.0–0.4)
EOS: 1 %
Hematocrit: 39.1 % (ref 34.0–46.6)
Hemoglobin: 13.2 g/dL (ref 11.1–15.9)
IMMATURE GRANULOCYTES: 0 %
Immature Grans (Abs): 0 10*3/uL (ref 0.0–0.1)
Lymphocytes Absolute: 1.4 10*3/uL (ref 0.7–3.1)
Lymphs: 36 %
MCH: 31.8 pg (ref 26.6–33.0)
MCHC: 33.8 g/dL (ref 31.5–35.7)
MCV: 94 fL (ref 79–97)
MONOS ABS: 0.4 10*3/uL (ref 0.1–0.9)
Monocytes: 11 %
NEUTROS PCT: 51 %
Neutrophils Absolute: 2 10*3/uL (ref 1.4–7.0)
PLATELETS: 227 10*3/uL (ref 150–379)
RBC: 4.15 x10E6/uL (ref 3.77–5.28)
RDW: 13 % (ref 12.3–15.4)
WBC: 3.9 10*3/uL (ref 3.4–10.8)

## 2017-11-20 LAB — BASIC METABOLIC PANEL
BUN/Creatinine Ratio: 10 (ref 9–23)
BUN: 11 mg/dL (ref 6–24)
CALCIUM: 9.7 mg/dL (ref 8.7–10.2)
CO2: 24 mmol/L (ref 20–29)
CREATININE: 1.1 mg/dL — AB (ref 0.57–1.00)
Chloride: 100 mmol/L (ref 96–106)
GFR calc Af Amer: 66 mL/min/{1.73_m2} (ref >59)
GFR, EST NON AFRICAN AMERICAN: 57 mL/min/{1.73_m2} — AB (ref >59)
GLUCOSE: 117 mg/dL — AB (ref 65–99)
POTASSIUM: 4.8 mmol/L (ref 3.5–5.2)
SODIUM: 142 mmol/L (ref 134–144)

## 2017-11-20 LAB — TSH: TSH: 1.16 u[IU]/mL (ref 0.450–4.500)

## 2017-11-20 LAB — PROTIME-INR
INR: 1 (ref 0.8–1.2)
PROTHROMBIN TIME: 10.5 s (ref 9.1–12.0)

## 2017-11-20 LAB — APTT: APTT: 26 s (ref 24–33)

## 2017-11-22 ENCOUNTER — Inpatient Hospital Stay (HOSPITAL_COMMUNITY)
Admission: AD | Admit: 2017-11-22 | Discharge: 2017-11-30 | DRG: 234 | Disposition: A | Discharge status: Home Health | Payer: Medicare HMO | Source: Ambulatory Visit | Attending: Thoracic Surgery (Cardiothoracic Vascular Surgery) | Admitting: Thoracic Surgery (Cardiothoracic Vascular Surgery)

## 2017-11-22 ENCOUNTER — Encounter (HOSPITAL_COMMUNITY)
Admission: AD | Disposition: A | Discharge status: Home Health | Payer: Self-pay | Source: Ambulatory Visit | Attending: Thoracic Surgery (Cardiothoracic Vascular Surgery)

## 2017-11-22 ENCOUNTER — Encounter (HOSPITAL_COMMUNITY): Payer: Self-pay | Admitting: Thoracic Surgery (Cardiothoracic Vascular Surgery)

## 2017-11-22 ENCOUNTER — Other Ambulatory Visit: Payer: Self-pay | Admitting: *Deleted

## 2017-11-22 ENCOUNTER — Other Ambulatory Visit: Payer: Self-pay

## 2017-11-22 DIAGNOSIS — Z79899 Other long term (current) drug therapy: Secondary | ICD-10-CM

## 2017-11-22 DIAGNOSIS — R079 Chest pain, unspecified: Secondary | ICD-10-CM

## 2017-11-22 DIAGNOSIS — R269 Unspecified abnormalities of gait and mobility: Secondary | ICD-10-CM | POA: Diagnosis not present

## 2017-11-22 DIAGNOSIS — R9439 Abnormal result of other cardiovascular function study: Secondary | ICD-10-CM | POA: Diagnosis present

## 2017-11-22 DIAGNOSIS — E781 Pure hyperglyceridemia: Secondary | ICD-10-CM | POA: Diagnosis present

## 2017-11-22 DIAGNOSIS — I459 Conduction disorder, unspecified: Secondary | ICD-10-CM | POA: Diagnosis not present

## 2017-11-22 DIAGNOSIS — J9811 Atelectasis: Secondary | ICD-10-CM | POA: Diagnosis not present

## 2017-11-22 DIAGNOSIS — R000 Tachycardia, unspecified: Secondary | ICD-10-CM | POA: Diagnosis present

## 2017-11-22 DIAGNOSIS — I2510 Atherosclerotic heart disease of native coronary artery without angina pectoris: Secondary | ICD-10-CM | POA: Diagnosis not present

## 2017-11-22 DIAGNOSIS — Z8544 Personal history of malignant neoplasm of other female genital organs: Secondary | ICD-10-CM | POA: Diagnosis not present

## 2017-11-22 DIAGNOSIS — Z01810 Encounter for preprocedural cardiovascular examination: Secondary | ICD-10-CM | POA: Diagnosis not present

## 2017-11-22 DIAGNOSIS — E8770 Fluid overload, unspecified: Secondary | ICD-10-CM | POA: Diagnosis not present

## 2017-11-22 DIAGNOSIS — Z981 Arthrodesis status: Secondary | ICD-10-CM | POA: Diagnosis not present

## 2017-11-22 DIAGNOSIS — I10 Essential (primary) hypertension: Secondary | ICD-10-CM | POA: Diagnosis not present

## 2017-11-22 DIAGNOSIS — I25110 Atherosclerotic heart disease of native coronary artery with unstable angina pectoris: Principal | ICD-10-CM

## 2017-11-22 DIAGNOSIS — I340 Nonrheumatic mitral (valve) insufficiency: Secondary | ICD-10-CM | POA: Diagnosis not present

## 2017-11-22 DIAGNOSIS — I25119 Atherosclerotic heart disease of native coronary artery with unspecified angina pectoris: Secondary | ICD-10-CM | POA: Diagnosis not present

## 2017-11-22 DIAGNOSIS — Z8249 Family history of ischemic heart disease and other diseases of the circulatory system: Secondary | ICD-10-CM

## 2017-11-22 DIAGNOSIS — E785 Hyperlipidemia, unspecified: Secondary | ICD-10-CM | POA: Diagnosis not present

## 2017-11-22 DIAGNOSIS — Z6830 Body mass index (BMI) 30.0-30.9, adult: Secondary | ICD-10-CM | POA: Diagnosis not present

## 2017-11-22 DIAGNOSIS — J90 Pleural effusion, not elsewhere classified: Secondary | ICD-10-CM | POA: Diagnosis not present

## 2017-11-22 DIAGNOSIS — Z90710 Acquired absence of both cervix and uterus: Secondary | ICD-10-CM | POA: Diagnosis not present

## 2017-11-22 DIAGNOSIS — Z01818 Encounter for other preprocedural examination: Secondary | ICD-10-CM | POA: Diagnosis not present

## 2017-11-22 DIAGNOSIS — Z791 Long term (current) use of non-steroidal anti-inflammatories (NSAID): Secondary | ICD-10-CM | POA: Diagnosis not present

## 2017-11-22 DIAGNOSIS — Z21 Asymptomatic human immunodeficiency virus [HIV] infection status: Secondary | ICD-10-CM | POA: Diagnosis present

## 2017-11-22 DIAGNOSIS — Z973 Presence of spectacles and contact lenses: Secondary | ICD-10-CM | POA: Diagnosis not present

## 2017-11-22 DIAGNOSIS — E7849 Other hyperlipidemia: Secondary | ICD-10-CM | POA: Diagnosis not present

## 2017-11-22 DIAGNOSIS — Z951 Presence of aortocoronary bypass graft: Secondary | ICD-10-CM

## 2017-11-22 DIAGNOSIS — D62 Acute posthemorrhagic anemia: Secondary | ICD-10-CM | POA: Diagnosis not present

## 2017-11-22 DIAGNOSIS — E669 Obesity, unspecified: Secondary | ICD-10-CM | POA: Diagnosis not present

## 2017-11-22 DIAGNOSIS — I208 Other forms of angina pectoris: Secondary | ICD-10-CM | POA: Diagnosis present

## 2017-11-22 DIAGNOSIS — I5030 Unspecified diastolic (congestive) heart failure: Secondary | ICD-10-CM | POA: Diagnosis not present

## 2017-11-22 HISTORY — PX: LEFT HEART CATH AND CORONARY ANGIOGRAPHY: CATH118249

## 2017-11-22 LAB — POCT ACTIVATED CLOTTING TIME
ACTIVATED CLOTTING TIME: 180 s
ACTIVATED CLOTTING TIME: 202 s

## 2017-11-22 SURGERY — LEFT HEART CATH AND CORONARY ANGIOGRAPHY
Anesthesia: LOCAL

## 2017-11-22 MED ORDER — MORPHINE SULFATE (PF) 2 MG/ML IV SOLN: 2.0000 mg | INTRAVENOUS | Status: DC | PRN

## 2017-11-22 MED ORDER — SODIUM CHLORIDE 0.9 % IV SOLN: 250.0000 mL | INTRAVENOUS | Status: DC | PRN

## 2017-11-22 MED ORDER — HEPARIN SODIUM (PORCINE) 1000 UNIT/ML IJ SOLN
INTRAMUSCULAR | Status: AC
  Filled 2017-11-22: qty 1

## 2017-11-22 MED ORDER — DARUNAVIR ETHANOLATE 600 MG PO TABS
600.0000 mg | ORAL_TABLET | Freq: Two times a day (BID) | ORAL | Status: DC
  Administered 2017-11-22 – 2017-11-23 (×2): 600 mg via ORAL
  Filled 2017-11-22 (×3): qty 1

## 2017-11-22 MED ORDER — MIDAZOLAM HCL 2 MG/2ML IJ SOLN
INTRAMUSCULAR | Status: AC
  Filled 2017-11-22: qty 2

## 2017-11-22 MED ORDER — SODIUM CHLORIDE 0.9 % IV SOLN: INTRAVENOUS | Status: AC

## 2017-11-22 MED ORDER — FENTANYL CITRATE (PF) 100 MCG/2ML IJ SOLN
INTRAMUSCULAR | Status: AC
  Filled 2017-11-22: qty 2

## 2017-11-22 MED ORDER — ELVITEG-COBIC-EMTRICIT-TENOFAF 150-150-200-10 MG PO TABS
1.0000 | ORAL_TABLET | Freq: Every day | ORAL | Status: DC
  Administered 2017-11-23 – 2017-11-30 (×7): 1 via ORAL
  Filled 2017-11-22 (×8): qty 1

## 2017-11-22 MED ORDER — ASPIRIN 81 MG PO CHEW
81.0000 mg | CHEWABLE_TABLET | Freq: Every day | ORAL | Status: DC
  Administered 2017-11-23 – 2017-11-24 (×2): 81 mg via ORAL
  Filled 2017-11-22 (×2): qty 1

## 2017-11-22 MED ORDER — VERAPAMIL HCL 2.5 MG/ML IV SOLN
INTRAVENOUS | Status: AC
  Filled 2017-11-22: qty 2

## 2017-11-22 MED ORDER — FENTANYL CITRATE (PF) 100 MCG/2ML IJ SOLN
INTRAMUSCULAR | Status: DC | PRN
  Administered 2017-11-22: 25 ug via INTRAVENOUS

## 2017-11-22 MED ORDER — SODIUM CHLORIDE 0.9% FLUSH: 3.0000 mL | INTRAVENOUS | Status: DC | PRN

## 2017-11-22 MED ORDER — SODIUM CHLORIDE 0.9 % WEIGHT BASED INFUSION
3.0000 mL/kg/h | INTRAVENOUS | Status: DC
  Administered 2017-11-22: 3 mL/kg/h via INTRAVENOUS

## 2017-11-22 MED ORDER — ASPIRIN 81 MG PO CHEW
81.0000 mg | CHEWABLE_TABLET | ORAL | Status: AC
  Administered 2017-11-22: 81 mg via ORAL

## 2017-11-22 MED ORDER — ASPIRIN 81 MG PO CHEW
CHEWABLE_TABLET | ORAL | Status: AC
  Filled 2017-11-22: qty 1

## 2017-11-22 MED ORDER — HEPARIN (PORCINE) IN NACL 2-0.9 UNIT/ML-% IJ SOLN
INTRAMUSCULAR | Status: AC | PRN
  Administered 2017-11-22: 1000 mL

## 2017-11-22 MED ORDER — VERAPAMIL HCL 2.5 MG/ML IV SOLN
INTRA_ARTERIAL | Status: DC | PRN
  Administered 2017-11-22: 7 mL via INTRA_ARTERIAL

## 2017-11-22 MED ORDER — LIDOCAINE HCL (PF) 1 % IJ SOLN
INTRAMUSCULAR | Status: DC | PRN
  Administered 2017-11-22: 2 mL
  Administered 2017-11-22: 20 mL

## 2017-11-22 MED ORDER — SODIUM CHLORIDE 0.9 % WEIGHT BASED INFUSION: 1.0000 mL/kg/h | INTRAVENOUS | Status: DC

## 2017-11-22 MED ORDER — SODIUM CHLORIDE 0.9% FLUSH
3.0000 mL | Freq: Two times a day (BID) | INTRAVENOUS | Status: DC
  Administered 2017-11-23 – 2017-11-24 (×4): 3 mL via INTRAVENOUS

## 2017-11-22 MED ORDER — ONDANSETRON HCL 4 MG/2ML IJ SOLN: 4.0000 mg | Freq: Four times a day (QID) | INTRAMUSCULAR | Status: DC | PRN

## 2017-11-22 MED ORDER — ATORVASTATIN CALCIUM 80 MG PO TABS
80.0000 mg | ORAL_TABLET | Freq: Every day | ORAL | Status: DC
  Administered 2017-11-22 – 2017-11-29 (×7): 80 mg via ORAL
  Filled 2017-11-22 (×2): qty 1
  Filled 2017-11-22: qty 2
  Filled 2017-11-22 (×5): qty 1

## 2017-11-22 MED ORDER — HEPARIN SODIUM (PORCINE) 1000 UNIT/ML IJ SOLN
INTRAMUSCULAR | Status: DC | PRN
  Administered 2017-11-22: 3500 [IU] via INTRAVENOUS

## 2017-11-22 MED ORDER — ACETAMINOPHEN 325 MG PO TABS
650.0000 mg | ORAL_TABLET | ORAL | Status: DC | PRN
  Administered 2017-11-23 – 2017-11-24 (×3): 650 mg via ORAL
  Filled 2017-11-22 (×3): qty 2

## 2017-11-22 MED ORDER — IOPAMIDOL (ISOVUE-370) INJECTION 76%
INTRAVENOUS | Status: DC | PRN
  Administered 2017-11-22: 95 mL via INTRA_ARTERIAL

## 2017-11-22 MED ORDER — HEPARIN (PORCINE) IN NACL 2-0.9 UNIT/ML-% IJ SOLN
INTRAMUSCULAR | Status: AC
  Filled 2017-11-22: qty 1000

## 2017-11-22 MED ORDER — ASPIRIN 81 MG PO CHEW: 81.0000 mg | CHEWABLE_TABLET | ORAL | Status: DC

## 2017-11-22 MED ORDER — MIDAZOLAM HCL 2 MG/2ML IJ SOLN
INTRAMUSCULAR | Status: DC | PRN
Start: 2017-11-22 — End: 2017-11-22
  Administered 2017-11-22: 1 mg via INTRAVENOUS

## 2017-11-22 MED ORDER — NITROGLYCERIN 1 MG/10 ML FOR IR/CATH LAB
INTRA_ARTERIAL | Status: AC
  Filled 2017-11-22: qty 10

## 2017-11-22 MED ORDER — SPIRONOLACTONE 25 MG PO TABS
25.0000 mg | ORAL_TABLET | Freq: Once | ORAL | Status: AC
  Administered 2017-11-22: 25 mg via ORAL
  Filled 2017-11-22: qty 1

## 2017-11-22 MED ORDER — LIDOCAINE HCL (PF) 1 % IJ SOLN
INTRAMUSCULAR | Status: AC
  Filled 2017-11-22: qty 30

## 2017-11-22 MED ORDER — IOPAMIDOL (ISOVUE-370) INJECTION 76%
INTRAVENOUS | Status: AC
  Filled 2017-11-22: qty 100

## 2017-11-22 SURGICAL SUPPLY — 20 items
CATH INFINITI 5 FR JL3.5 (CATHETERS) ×1 IMPLANT
CATH INFINITI 5FR ANG PIGTAIL (CATHETERS) ×1 IMPLANT
CATH INFINITI 5FR JL4 (CATHETERS) ×1 IMPLANT
CATH INFINITI JR4 5F (CATHETERS) ×1 IMPLANT
CATH LAUNCHER 5F EBU3.0 (CATHETERS) IMPLANT
CATH OPTITORQUE TIG 4.0 5F (CATHETERS) ×1 IMPLANT
CATHETER LAUNCHER 5F EBU3.0 (CATHETERS) ×2
DEVICE CONTINUOUS FLUSH (MISCELLANEOUS) ×1 IMPLANT
DEVICE RAD COMP TR BAND LRG (VASCULAR PRODUCTS) ×1 IMPLANT
GLIDESHEATH SLEND A-KIT 6F 22G (SHEATH) ×1 IMPLANT
GUIDEWIRE INQWIRE 1.5J.035X260 (WIRE) IMPLANT
INQWIRE 1.5J .035X260CM (WIRE) ×2
KIT HEART LEFT (KITS) ×2 IMPLANT
PACK CARDIAC CATHETERIZATION (CUSTOM PROCEDURE TRAY) ×2 IMPLANT
SHEATH PINNACLE 5F 10CM (SHEATH) ×1 IMPLANT
SYR MEDRAD MARK V 150ML (SYRINGE) ×2 IMPLANT
TRANSDUCER W/STOPCOCK (MISCELLANEOUS) ×2 IMPLANT
TUBING CIL FLEX 10 FLL-RA (TUBING) ×2 IMPLANT
WIRE EMERALD 3MM-J .035X150CM (WIRE) ×1 IMPLANT
WIRE HI TORQ VERSACORE-J 145CM (WIRE) ×1 IMPLANT

## 2017-11-22 NOTE — Interval H&P Note (Signed)
Cath Lab Visit (complete for each Cath Lab visit)  Clinical Evaluation Leading to the Procedure:   ACS: Yes.    Non-ACS:    Anginal Classification: CCS III  Anti-ischemic medical therapy: No Therapy  Non-Invasive Test Results: High-risk stress test findings: cardiac mortality >3%/year  Prior CABG: No previous CABG      History and Physical Interval Note:  11/22/2017 12:50 PM  Cathy Smith  has presented today for surgery, with the diagnosis of abn stress  The various methods of treatment have been discussed with the patient and family. After consideration of risks, benefits and other options for treatment, the patient has consented to  Procedure(s): LEFT HEART CATH AND CORONARY ANGIOGRAPHY (N/A) as a surgical intervention .  The patient's history has been reviewed, patient examined, no change in status, stable for surgery.  I have reviewed the patient's chart and labs.  Questions were answered to the patient's satisfaction.     Nanetta BattyJonathan Vinny Taranto

## 2017-11-22 NOTE — Progress Notes (Signed)
Pt is stable, cardiac cath  site is CDI, TR band still in place if in case (per nursing judgement), will leave some more hours and reassess, vitals stable so far, no any complain of pain, will continue to monitor the patient  Lonia FarberRekha, RN

## 2017-11-22 NOTE — Progress Notes (Signed)
Site area: rt groin fa sheath Site Prior to Removal:  Level 0 Pressure Applied For:  20 minutes Manual:   yes Patient Status During Pull:  stable Post Pull Site:  Level  0 Post Pull Instructions Given:  yes Post Pull Pulses Present:  palpable Dressing Applied:   Gauze and tegaderm Bedrest begins @ 1435 Comments:   

## 2017-11-22 NOTE — Consult Note (Signed)
[address]       [place] [id number]             [phone]          CARDIOTHORACIC SURGERY CONSULTATION REPORT  PCP is Bing NeighborsHarris, Kimberly S, FNP Referring Provider is Runell GessBerry, Jonathan J, MD  Reason for consultation:  Severe multivessel CAD  HPI:  Patient is a 52 year old moderately obese female who is HIV positive with no previous history of coronary artery disease and risk factors notable only for history of hypertension and hyperlipidemia who has been referred for surgical consultation to discuss treatment options for management of 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  Patient states that she was in her usual state of health until a proximally 3 months ago when she began to experience substernal chest discomfort with exertion.  She describes the chest discomfort as dull pressure-like pain across the chest sometimes associated with shortness of breath.  Pain is usually exacerbated by walking or doing some sort of physical activity around the house and always relieved within a few minutes of rest.  The patient denies any history of resting chest pain or nocturnal chest pain.  She denies any PND, orthopnea, or lower extremity edema.  She was referred to Dr. Allyson SabalBerry and underwent nuclear stress test that was abnormal and felt to be high risk.  She underwent diagnostic cardiac catheterization earlier today demonstrating left main and three-vessel coronary artery disease with preserved left ventricular systolic function.  Cardiothoracic surgical consultation was requested.  Patient is married and [place].  She has no children.  Although her husband is not currently present she is accompanied by her mother, her sister, and her niece for hospital consultation visit this afternoon.  Up until recently the patient typically goes for a walk once a day with family members.  She has had to stop this because of the  development of exertional chest discomfort.  She does not otherwise exercise on a regular basis.  She reports no other significant physical limitations.   Past Medical History:  Diagnosis Date  . History of condyloma acuminatum   . History of uterine fibroid   . History of vaginal dysplasia    VAIN III--  oncologist-  dr Andrey Farmerrossi  . History of vulvar dysplasia    VIN III   gyn-oncologist-  dr Andrey Farmerrossi  . HIV infection (H[facility]) [facility]-  dr hatcher  . Hypertension   . Hypertriglyceridemia without hypercholesterolemia   . PONV (postoperative nausea and vomiting)   . VIN III (vulvar intraepithelial neoplasia III)   . Wears glasses     Past Surgical History:  Procedure Laterality Date  . ABDOMINAL HYSTERECTOMY  1995 approx  . ANTERIOR CERVICAL DECOMP/DISCECTOMY FUSION  11-30-2009   C4 -- C6  . CO2 LASER APPLICATION Bilateral 07/23/2015   Procedure: BILATERAL CO2 LASER ABLATION OF THE VULVA;  Surgeon: Adolphus BirchwoodEmma Rossi, MD;  [facility] [place];  Service: Gynecology;  Laterality: Bilateral;  . CO2 LASER APPLICATION N/A 02/25/2016   Procedure: CO2 LASER OF THE VULVAR;  Surgeon: Adolphus BirchwoodEmma Rossi, MD;  [facility] [place];  Service: Gynecology;  Laterality: N/A;  . CO2 LASER APPLICATION N/A 05/04/2017   Procedure: CO2 LASER VAPORIZATION OF THE VULVA;  Surgeon: Adolphus Birchwoodossi, Emma, MD;  [facility] [place];  Service: Gynecology;  Laterality: N/A;  . COLONOSCOPY  01-28-2015  . I & D LEFT  BUTTOCK ABSCESS  04-28-2001  . VULVECTOMY Right 05/06/2015   Procedure: WIDE LOCAL  EXCISION  OF RIGHT VULVA;  Surgeon: Adolphus Birchwood, MD;  Location: [facility] [place];  Service: Gynecology;  Laterality: Right;  Marland Kitchen VULVECTOMY N/A 05/04/2017   Procedure: WIDE EXCISION VULVECTOMY;  Surgeon: Adolphus Birchwood, MD;  Location[facility];  Service: Gynecology;  Laterality: N/A;    Family History  Problem Relation Age of Onset  . Cataracts Mother     . Hypertension Father   . Colon cancer Neg Hx     Social History   Socioeconomic History  . Marital status: Married    Spouse name: Not on file  . Number of children: Not on file  . Years of education: Not on file  . Highest education level: Not on file  Social Needs  . Financial resource strain: Not on file  . Food insecurity - worry: Not on file  . Food insecurity - inability: Not on file  . Transportation needs - medical: Not on file  . Transportation needs - non-medical: Not on file  Occupational History  . Not on file  Tobacco Use  . Smoking status: Never Smoker  . Smokeless tobacco: Never Used  Substance and Sexual Activity  . Alcohol use: No    Alcohol/week: 0.0 oz  . Drug use: No  . Sexual activity: Yes    Partners: Male    Birth control/protection: Condom    Comment: pt. given condoms  Other Topics Concern  . Not on file  Social History Narrative  . Not on file    Prior to Admission medications   Medication Sig Start Date End Date Taking? Authorizing Provider  GENVOYA 150-150-200-10 MG TABS tablet TAKE 1 TABLET BY MOUTH DAILY WITH BREAKFAST 09/08/17  Yes Ginnie Smart, MD  ibuprofen (ADVIL,MOTRIN) 200 MG tablet Take 200 mg by mouth daily as needed for headache.   Yes [provider]  Multiple Vitamin (MULTIVITAMIN WITH MINERALS) TABS tablet Take 1 tablet by mouth daily.   Yes [provider]  pravastatin (PRAVACHOL) 40 MG tablet TAKE 1 TABLET(40 MG) BY MOUTH DAILY 11/19/17  Yes Bing Neighbors, FNP  PREZISTA 800 MG tablet TAKE 1 TABLET(800 MG) BY MOUTH DAILY 09/08/17  Yes Ginnie Smart, MD  spironolactone (ALDACTONE) 25 MG tablet TAKE 1 TABLET BY MOUTH DAILY. 04/13/17  Yes Bing Neighbors, FNP  Vitamin D, Ergocalciferol, (DRISDOL) 50000 units CAPS capsule Take 1 capsule (50,000 Units total) by mouth every 7 (seven) days. Patient not taking: Reported on 11/19/2017 10/26/17   Bing Neighbors, FNP    Current  Facility-Administered Medications  Medication Dose Route Frequency Provider Last Rate Last Dose  . 0.9 %  sodium chloride infusion   Intravenous Continuous Runell Gess, MD 75 mL/hr at 11/22/17 1358    . 0.9 %  sodium chloride infusion  250 mL Intravenous PRN Runell Gess, MD      . acetaminophen (TYLENOL) tablet 650 mg  650 mg Oral Q4H PRN Runell Gess, MD      . aspirin 81 MG chewable tablet           . [START ON 11/23/2017] aspirin chewable tablet 81 mg  81 mg Oral Daily Runell Gess, MD      . atorvastatin (LIPITOR) tablet 80 mg  80 mg Oral q1800 Runell Gess, MD      . darunavir (PREZISTA) tablet 600 mg  600 mg Oral BID WC  Runell Gess, MD      . Melene Muller ON 11/23/2017] elvitegravir-cobicistat-emtricitabine-tenofovir (GENVOYA) 150-150-200-10 MG tablet 1 tablet  1 tablet Oral Q breakfast Runell Gess, MD      . morphine 2 MG/ML injection 2 mg  2 mg Intravenous Q1H PRN Runell Gess, MD      . ondansetron [facility]) injection 4 mg  4 mg Intravenous Q6H PRN Runell Gess, MD      . sodium chloride flush (NS) 0.9 % injection 3 mL  3 mL Intravenous Q12H Runell Gess, MD      . sodium chloride flush (NS) 0.9 % injection 3 mL  3 mL Intravenous PRN Runell Gess, MD      . spironolactone (ALDACTONE) tablet 25 mg  25 mg Oral Once Runell Gess, MD        No Known Allergies    Review of Systems:   General:  normal appetite, normal energy, no weight gain, no weight loss, no fever  Cardiac:  + chest pain with exertion, no chest pain at rest, +SOB with exertion, no resting SOB, no PND, no orthopnea, no palpitations, no arrhythmia, no atrial fibrillation, no LE edema, no dizzy spells, no syncope  Respiratory:  no shortness of breath, no home oxygen, no productive cough, occasional dry cough, no bronchitis, no wheezing, no hemoptysis, no asthma, no pain with inspiration or cough, no sleep apnea, no CPAP at night  GI:   no difficulty swallowing, no  reflux, no frequent heartburn, no hiatal hernia, no abdominal pain, no constipation, no diarrhea, no hematochezia, no hematemesis, no melena  GU:   no dysuria,  no frequency, no urinary tract infection, no hematuria, no kidney stones, no kidney disease  Vascular:  no pain suggestive of claudication, no pain in feet, no leg cramps, no varicose veins, no DVT, no non-healing foot ulcer  Neuro:   no stroke, no TIA's, no seizures, no headaches, no temporary blindness one eye,  no slurred speech, no peripheral neuropathy, no chronic pain, no instability of gait, no memory/cognitive dysfunction  Musculoskeletal: no arthritis , no joint swelling, no myalgias, no difficulty walking, normal mobility   Skin:   no rash, no itching, no skin infections, no pressure sores or ulcerations  Psych:   no anxiety, no depression, no nervousness, no unusual recent stress  Eyes:   no blurry vision, no floaters, no recent vision changes, + wears glasses or contacts  ENT:   no hearing loss, no loose or painful teeth, partial dentures  Hematologic:  no easy bruising, no abnormal bleeding, no clotting disorder, no frequent epistaxis  Endocrine:  no diabetes, does not check CBG's at home     Physical Exam:   BP (!) 144/99 (BP Location: Left Arm)   Pulse (!) 102   Temp 98.6 F (37 C) (Oral)   Resp 20   Ht 5\' 1"  (1.549 m)   Wt 148 lb 9.4 oz (67.4 kg)   SpO2 98%   BMI 28.08 kg/m   General:  Moderately obese, o/w  well-appearing  HEENT:  Unremarkable   Neck:   no JVD, no bruits, no adenopathy   Chest:   clear to auscultation, symmetrical breath sounds, no wheezes, no rhonchi   CV:   RRR, no  murmur   Abdomen:  soft, non-tender, no masses   Extremities:  warm, well-perfused, pulses diminished but palpable, no lower extremity edema  Rectal/GU  Deferred  Neuro:   Grossly non-focal and symmetrical throughout  Skin:   Clean and dry, no rashes, no breakdown  Diagnostic Tests:  LEFT HEART CATH AND CORONARY ANGIOGRAPHY   Conclusion     Ost RPDA to RPDA lesion is 95% stenosed.  Ost LM lesion is 75% stenosed.  Mid LM to Dist LM lesion is 50% stenosed.  Prox LAD lesion is 100% stenosed.  Ost 1st Mrg lesion is 95% stenosed.  The left ventricular systolic function is normal.  LV end diastolic pressure is normal.  The left ventricular ejection fraction is 55-65% by visual estimate.   ILYSE TREMAIN is a 52 y.o. female    [id number] LOCATION:  FACILITY: [facility]  PHYSICIAN: Nanetta Batty, M.D. 05/21/1966   DATE OF PROCEDURE:  11/22/2017  DATE OF DISCHARGE:     CARDIAC CATHETERIZATION     History obtained from chart review.Britt C Rileyis a 52 y.o.moderately overweight married African-American female with no children. Does not work. She was referred to me byKimberly Harris FNPfor new onset chest pain. She really has no cardiac risk factors other than hypertension hyperlipidemia both of which are treated. There is no family history. She does not smoke. She had onset of chest pain 3-4 months ago,, currently trying to 3 times a week lasting up to 5-10 minutes at a time associated with shortness of breath. she had a Myoview stress test that showed severe ischemia in the LAD territory. She presents today for outpatient angiography to define her anatomy.    IMPRESSION: Ms. Khatoon has left main/three-vessel disease with preserved LV function. She has ongoing chest pain with a high risk Myoview. She will require coronary artery bypass grafting. I subsequently imaged her LIMA artery which was widely patent. She is removed and a TR band placed in the right wrist, pressure held on the right groin to achieve hemostasis. The patient left the lab in stable condition. TCTS was notified.  Nanetta Batty. MD, [facility] 11/22/2017 1:57 PM      Indications   Abnormal nuclear stress test [R94.39 (ICD-10-CM)]  Unstable angina (HCC) [I20.0 (ICD-10-CM)]  Procedural Details/Technique    Technical Details PROCEDURE DESCRIPTION:   The patient was brought to the second floor [place] [facility] cath lab in the postabsorptive state. She was premedicated with Valium 5 mg by mouth, IV Versed and fentanyl. Her right wrist and groin Were prepped and shaved in usual sterile fashion. Xylocaine 1% was used for local anesthesia. A 6 French sheath was inserted into the right radial artery using standard Seldinger technique. A 5 French sheath was inserted into the right common femoral artery. The patient received 3500 units of heparin intravenously. A 5 [place] TIG catheter, pigtail catheters, right and left Judkins catheters were used for selective coronary angiography, left ventriculography and subselective left internal mammary artery angiography. Isovue was used for the entirety of the case. Retrograde, left ventricular end/pressures were recorded. Radial cocktail was administered via the SideArm sheath.   Estimated blood loss <50 mL.  During this procedure the patient was administered the following to achieve and maintain moderate conscious sedation: Versed 1 mg, Fentanyl 25 mcg, while the patient's heart rate, blood pressure, and oxygen saturation were continuously monitored. The period of conscious sedation was 42 minutes, of which I was present face-to-face 100% of this time.  Coronary Findings   Diagnostic  Dominance: Right  Left Main  Ost LM lesion 75% stenosed  Ost LM lesion is 75% stenosed.  Mid LM to Dist LM lesion 50% stenosed  Mid LM to Dist LM lesion is 50%  stenosed.  Left Anterior Descending  Collaterals  Mid LAD filled by collaterals from RPDA.    Prox LAD lesion 100% stenosed  Prox LAD lesion is 100% stenosed.  Left Circumflex  First Obtuse Marginal Branch  Ost 1st Mrg lesion 95% stenosed  Ost 1st Mrg lesion is 95% stenosed.  Right Coronary Artery  Right Posterior Descending Artery  Ost RPDA to RPDA lesion 95% stenosed  Ost RPDA to RPDA lesion is 95% stenosed.   Intervention   No interventions have been documented.  Wall Motion              Left Heart   Left Ventricle The left ventricular size is normal. The left ventricular systolic function is normal. LV end diastolic pressure is normal. The left ventricular ejection fraction is 55-65% by visual estimate. No regional wall motion abnormalities.  Coronary Diagrams   Diagnostic Diagram           Impression:  Patient has left main and severe three-vessel coronary artery disease with preserved left ventricular systolic function.  She presents with symptoms consistent with stable angina pectoris.  I have personally reviewed the patient's diagnostic cardiac catheterization and agree that she would best be treated with surgical revascularization.    Plan:  I have reviewed the indications, risks, and potential benefits of coronary artery bypass grafting with the patient and her family.  Alternative treatment strategies have been discussed, including the relative risks, benefits and long term prognosis associated with medical therapy, percutaneous coronary intervention, and surgical revascularization.  The patient understands and accepts all potential associated risks of surgery including but not limited to risk of death, stroke or other neurologic complication, myocardial infarction, congestive heart failure, respiratory failure, renal failure, bleeding requiring blood transfusion and/or reexploration, aortic dissection or other major vascular complication, arrhythmia, heart block or bradycardia requiring permanent pacemaker, pneumonia, pleural effusion, wound infection, pulmonary embolus or other thromboembolic complication, chronic pain or other delayed complications related to median sternotomy, or the late recurrence of symptomatic ischemic heart disease and/or congestive heart failure.  The importance of long term risk modification have been emphasized.  All questions answered.  We  tentatively plan to proceed with coronary artery bypass grafting on Thursday, November 25, 2017.   I spent in excess of 120 minutes during the conduct of this hospital consultation and >50% of this time involved direct face-to-face encounter for counseling and/or coordination of the patient's care.   Salvatore Decent. Cornelius Moras, MD 11/22/2017 6:31 PM

## 2017-11-23 ENCOUNTER — Ambulatory Visit (HOSPITAL_COMMUNITY): Payer: Medicare HMO

## 2017-11-23 ENCOUNTER — Ambulatory Visit (HOSPITAL_BASED_OUTPATIENT_CLINIC_OR_DEPARTMENT_OTHER): Payer: Medicare HMO

## 2017-11-23 ENCOUNTER — Encounter (HOSPITAL_COMMUNITY): Payer: Self-pay | Admitting: Cardiovascular Disease

## 2017-11-23 DIAGNOSIS — Z21 Asymptomatic human immunodeficiency virus [HIV] infection status: Secondary | ICD-10-CM | POA: Diagnosis not present

## 2017-11-23 DIAGNOSIS — Z90710 Acquired absence of both cervix and uterus: Secondary | ICD-10-CM | POA: Diagnosis not present

## 2017-11-23 DIAGNOSIS — I25119 Atherosclerotic heart disease of native coronary artery with unspecified angina pectoris: Secondary | ICD-10-CM | POA: Diagnosis not present

## 2017-11-23 DIAGNOSIS — Z01810 Encounter for preprocedural cardiovascular examination: Secondary | ICD-10-CM

## 2017-11-23 DIAGNOSIS — Z973 Presence of spectacles and contact lenses: Secondary | ICD-10-CM | POA: Diagnosis not present

## 2017-11-23 DIAGNOSIS — E7849 Other hyperlipidemia: Secondary | ICD-10-CM | POA: Diagnosis not present

## 2017-11-23 DIAGNOSIS — I2510 Atherosclerotic heart disease of native coronary artery without angina pectoris: Secondary | ICD-10-CM | POA: Diagnosis not present

## 2017-11-23 DIAGNOSIS — R269 Unspecified abnormalities of gait and mobility: Secondary | ICD-10-CM | POA: Diagnosis not present

## 2017-11-23 DIAGNOSIS — J90 Pleural effusion, not elsewhere classified: Secondary | ICD-10-CM | POA: Diagnosis not present

## 2017-11-23 DIAGNOSIS — Z8249 Family history of ischemic heart disease and other diseases of the circulatory system: Secondary | ICD-10-CM | POA: Diagnosis not present

## 2017-11-23 DIAGNOSIS — I5030 Unspecified diastolic (congestive) heart failure: Secondary | ICD-10-CM | POA: Diagnosis not present

## 2017-11-23 DIAGNOSIS — Z791 Long term (current) use of non-steroidal anti-inflammatories (NSAID): Secondary | ICD-10-CM | POA: Diagnosis not present

## 2017-11-23 DIAGNOSIS — I25110 Atherosclerotic heart disease of native coronary artery with unstable angina pectoris: Secondary | ICD-10-CM | POA: Diagnosis not present

## 2017-11-23 DIAGNOSIS — R9439 Abnormal result of other cardiovascular function study: Secondary | ICD-10-CM

## 2017-11-23 DIAGNOSIS — E785 Hyperlipidemia, unspecified: Secondary | ICD-10-CM | POA: Diagnosis not present

## 2017-11-23 DIAGNOSIS — E669 Obesity, unspecified: Secondary | ICD-10-CM | POA: Diagnosis not present

## 2017-11-23 DIAGNOSIS — I10 Essential (primary) hypertension: Secondary | ICD-10-CM | POA: Diagnosis not present

## 2017-11-23 DIAGNOSIS — Z79899 Other long term (current) drug therapy: Secondary | ICD-10-CM | POA: Diagnosis not present

## 2017-11-23 DIAGNOSIS — D62 Acute posthemorrhagic anemia: Secondary | ICD-10-CM | POA: Diagnosis not present

## 2017-11-23 DIAGNOSIS — Z981 Arthrodesis status: Secondary | ICD-10-CM | POA: Diagnosis not present

## 2017-11-23 DIAGNOSIS — Z8544 Personal history of malignant neoplasm of other female genital organs: Secondary | ICD-10-CM | POA: Diagnosis not present

## 2017-11-23 DIAGNOSIS — E8770 Fluid overload, unspecified: Secondary | ICD-10-CM | POA: Diagnosis not present

## 2017-11-23 DIAGNOSIS — E781 Pure hyperglyceridemia: Secondary | ICD-10-CM | POA: Diagnosis not present

## 2017-11-23 DIAGNOSIS — R000 Tachycardia, unspecified: Secondary | ICD-10-CM | POA: Diagnosis present

## 2017-11-23 DIAGNOSIS — Z01818 Encounter for other preprocedural examination: Secondary | ICD-10-CM | POA: Diagnosis not present

## 2017-11-23 DIAGNOSIS — Z6830 Body mass index (BMI) 30.0-30.9, adult: Secondary | ICD-10-CM | POA: Diagnosis not present

## 2017-11-23 DIAGNOSIS — I459 Conduction disorder, unspecified: Secondary | ICD-10-CM | POA: Diagnosis not present

## 2017-11-23 DIAGNOSIS — I340 Nonrheumatic mitral (valve) insufficiency: Secondary | ICD-10-CM | POA: Diagnosis not present

## 2017-11-23 DIAGNOSIS — J9811 Atelectasis: Secondary | ICD-10-CM | POA: Diagnosis not present

## 2017-11-23 LAB — CBC
HCT: 37.7 % (ref 36.0–46.0)
HEMOGLOBIN: 12.8 g/dL (ref 12.0–15.0)
MCH: 32.1 pg (ref 26.0–34.0)
MCHC: 34 g/dL (ref 30.0–36.0)
MCV: 94.5 fL (ref 78.0–100.0)
Platelets: 192 10*3/uL (ref 150–400)
RBC: 3.99 MIL/uL (ref 3.87–5.11)
RDW: 12 % (ref 11.5–15.5)
WBC: 6.2 10*3/uL (ref 4.0–10.5)

## 2017-11-23 LAB — COMPREHENSIVE METABOLIC PANEL
ALBUMIN: 4.1 g/dL (ref 3.5–5.0)
ALK PHOS: 61 U/L (ref 38–126)
ALT: 28 U/L (ref 14–54)
AST: 25 U/L (ref 15–41)
Anion gap: 12 (ref 5–15)
BILIRUBIN TOTAL: 1 mg/dL (ref 0.3–1.2)
BUN: 8 mg/dL (ref 6–20)
CO2: 22 mmol/L (ref 22–32)
Calcium: 9.6 mg/dL (ref 8.9–10.3)
Chloride: 103 mmol/L (ref 101–111)
Creatinine, Ser: 0.86 mg/dL (ref 0.44–1.00)
GFR calc Af Amer: >60 mL/min (ref >60)
GFR calc non Af Amer: >60 mL/min (ref >60)
GLUCOSE: 118 mg/dL — AB (ref 65–99)
POTASSIUM: 4.2 mmol/L (ref 3.5–5.1)
Sodium: 137 mmol/L (ref 135–145)
TOTAL PROTEIN: 7.6 g/dL (ref 6.5–8.1)

## 2017-11-23 LAB — ECHOCARDIOGRAM COMPLETE
HEIGHTINCHES: 61 in
WEIGHTICAEL: 2353.6 [oz_av]

## 2017-11-23 LAB — BLOOD GAS, ARTERIAL
Acid-Base Excess: 0.3 mmol/L (ref 0.0–2.0)
BICARBONATE: 24.5 mmol/L (ref 20.0–28.0)
Drawn by: 52078
O2 Saturation: 95.6 %
PH ART: 7.399 (ref 7.350–7.450)
PO2 ART: 79.2 mmHg — AB (ref 83.0–108.0)
Patient temperature: 98.6
pCO2 arterial: 40.5 mmHg (ref 32.0–48.0)

## 2017-11-23 LAB — HEMOGLOBIN A1C
Hgb A1c MFr Bld: 5.6 % (ref 4.8–5.6)
Mean Plasma Glucose: 114.02 mg/dL

## 2017-11-23 LAB — URINALYSIS, ROUTINE W REFLEX MICROSCOPIC
BILIRUBIN URINE: NEGATIVE
Glucose, UA: NEGATIVE mg/dL
HGB URINE DIPSTICK: NEGATIVE
KETONES UR: NEGATIVE mg/dL
LEUKOCYTES UA: NEGATIVE
Nitrite: NEGATIVE
PROTEIN: NEGATIVE mg/dL
Specific Gravity, Urine: 1.017 (ref 1.005–1.030)
pH: 5 (ref 5.0–8.0)

## 2017-11-23 LAB — PROTIME-INR
INR: 1.02
Prothrombin Time: 13.3 seconds (ref 11.4–15.2)

## 2017-11-23 LAB — APTT: aPTT: 30 seconds (ref 24–36)

## 2017-11-23 MED ORDER — METOPROLOL TARTRATE 25 MG PO TABS
25.0000 mg | ORAL_TABLET | Freq: Two times a day (BID) | ORAL | Status: DC
  Administered 2017-11-23 – 2017-11-24 (×4): 25 mg via ORAL
  Filled 2017-11-23 (×4): qty 1

## 2017-11-23 MED ORDER — DARUNAVIR ETHANOLATE 800 MG PO TABS
800.0000 mg | ORAL_TABLET | Freq: Every day | ORAL | Status: DC
  Administered 2017-11-24 – 2017-11-30 (×6): 800 mg via ORAL
  Filled 2017-11-23 (×7): qty 1

## 2017-11-23 NOTE — Progress Notes (Signed)
Progress Note  Patient Name: Cathy Smith Date of Encounter: 11/23/2017  Primary Cardiologist: Nanetta BattyJonathan Berry, MD   Subjective   Doing well post cath. No chest pain. No dyspnea. Right radial and right femoral cath sites are stable.   Inpatient Medications    Scheduled Meds: . aspirin  81 mg Oral Daily  . atorvastatin  80 mg Oral q1800  . darunavir  600 mg Oral BID WC  . elvitegravir-cobicistat-emtricitabine-tenofovir  1 tablet Oral Q breakfast  . sodium chloride flush  3 mL Intravenous Q12H   Continuous Infusions: . sodium chloride     PRN Meds: sodium chloride, acetaminophen, morphine injection, ondansetron (ZOFRAN) IV, sodium chloride flush   Vital Signs    Vitals:   11/22/17 2037 11/22/17 2114 11/22/17 2200 11/23/17 0501  BP: 135/90 130/80 130/80 135/86  Pulse: 87 86 88 92  Resp:    18  Temp: 98.2 F (36.8 C) 98 F (36.7 C) 98 F (36.7 C) 98.3 F (36.8 C)  TempSrc: Oral Oral Oral Oral  SpO2: 100% 100% 100% 100%  Weight:    147 lb 1.6 oz (66.7 kg)  Height:        Intake/Output Summary (Last 24 hours) at 11/23/2017 1035 Last data filed at 11/23/2017 1017 Gross per 24 hour  Intake 340 ml  Output 1100 ml  Net -760 ml   Filed Weights   11/22/17 1123 11/22/17 1734 11/23/17 0501  Weight: 149 lb (67.6 kg) 148 lb 9.4 oz (67.4 kg) 147 lb 1.6 oz (66.7 kg)    Telemetry    Sinus tach 110s - Personally Reviewed  ECG    NSR. Low voltage  - Personally Reviewed  Physical Exam   GEN: No acute distress.   Neck: No JVD Cardiac: RR, tachy rate, no murmurs, rubs, or gallops.  Respiratory: Clear to auscultation bilaterally. GI: Soft, nontender, non-distended  MS: No edema; No deformity. Neuro:  Nonfocal  Psych: Normal affect   Labs    Chemistry Recent Labs  Lab 11/17/17 1751 11/19/17 1216 11/23/17 0613  NA 137 142 137  K 3.9 4.8 4.2  CL 101 100 103  CO2 26 24 22   GLUCOSE 105* 117* 118*  BUN 10 11 8   CREATININE 0.84 1.10* 0.86  CALCIUM 10.0 9.7  9.6  PROT 8.8*  --  7.6  ALBUMIN 4.6  --  4.1  AST 30  --  25  ALT 27  --  28  ALKPHOS 69  --  61  BILITOT 0.7  --  1.0  GFRNONAA >60 57* >60  GFRAA >60 66 >60  ANIONGAP 10  --  12     Hematology Recent Labs  Lab 11/17/17 1751 11/19/17 1216 11/23/17 0613  WBC 5.2 3.9 6.2  RBC 4.16 4.15 3.99  HGB 13.2 13.2 12.8  HCT 40.1 39.1 37.7  MCV 96.4 94 94.5  MCH 31.7 31.8 32.1  MCHC 32.9 33.8 34.0  RDW 12.0 13.0 12.0  PLT 220 227 192    Cardiac EnzymesNo results for input(s): TROPONINI in the last 168 hours. No results for input(s): TROPIPOC in the last 168 hours.   BNPNo results for input(s): BNP, PROBNP in the last 168 hours.   DDimer No results for input(s): DDIMER in the last 168 hours.   Radiology    No results found.  Cardiac Studies   Procedures   LEFT HEART CATH AND CORONARY ANGIOGRAPHY  Conclusion     Ost RPDA to RPDA lesion is 95% stenosed.  Ost LM lesion is 75% stenosed.  Mid LM to Dist LM lesion is 50% stenosed.  Prox LAD lesion is 100% stenosed.  Ost 1st Mrg lesion is 95% stenosed.  The left ventricular systolic function is normal.  LV end diastolic pressure is normal.  The left ventricular ejection fraction is 55-65% by visual estimate.     2D echo 11/22/17 - pending   Patient Profile     52 y.o. female with HIV and h/o HLD admitted for elective LHC in the setting of recent CP and high risk NST. LHC 11/22/17 showed severe multivessel CAD. Plan is for CABG 11/25/17 by Dr. Cornelius Moras.   Assessment & Plan    1. CAD: multivessel disease as outlined above. Plan is for CABG on 11/25/17. LM and LAD disease. Currently CP free. Continue ASA and high dose statin. Pt also needs a BB. Sinus tach on tele. Plenty of BP room. Will start metoprolol 25 mg BID. 2D echo done. Results pending.   2. HIV: antiretroviral's ordered.   3. HLD: last lipid panel was 01/2017. LDL was 87. Will get updated FLP in the AM. Continue statin therapy with Lipitor. Goal LDL is < 70  mg/DL.    For questions or updates, please contact [facility] Please consult [email or web address] for contact info under Cardiology/STEMI.      Signed, Robbie Lis, PA-C  11/23/2017, 10:35 AM    Personally seen and examined. Agree with above.  No cp, sob. Awaiting CABG Thursday. Alert, RRR. Starting Bb.   Donato Schultz, MD

## 2017-11-23 NOTE — Progress Notes (Signed)
[id number] Gave pt [facility] booklet and care guide. Discussed importance of mobility and IS after surgery. Gave pt IS and she demonstrated 750 ml correctly. Family in room and stated they will be able to provide care 24/7 first week home. Discussed sternal precautions. Will follow up after surgery. Luetta NuttingCharlene Deshay Kirstein RN BSN 11/23/2017 3:15 PM

## 2017-11-23 NOTE — Progress Notes (Signed)
  Echocardiogram 2D Echocardiogram has been performed.  Celene SkeenVijay  Quinlin Conant 11/23/2017, 10:03 AM

## 2017-11-23 NOTE — Progress Notes (Signed)
Pre CABG Dopplers completed. No evidence of ICA stenosis. ABIs are within normal limits bilaterally. Palmar arch is normal. Graybar ElectricVirginia Orey Moure, RVS 11/23/2017, 1:13 PM

## 2017-11-24 ENCOUNTER — Ambulatory Visit: Payer: Medicare HMO | Admitting: Family Medicine

## 2017-11-24 ENCOUNTER — Encounter: Payer: Self-pay | Admitting: Infectious Diseases

## 2017-11-24 DIAGNOSIS — I10 Essential (primary) hypertension: Secondary | ICD-10-CM

## 2017-11-24 LAB — ABO/RH: ABO/RH(D): B POS

## 2017-11-24 LAB — SURGICAL PCR SCREEN
MRSA, PCR: NEGATIVE
Staphylococcus aureus: NEGATIVE

## 2017-11-24 MED ORDER — MILRINONE LACTATE IN DEXTROSE 20-5 MG/100ML-% IV SOLN
0.1250 ug/kg/min | INTRAVENOUS | Status: DC
  Filled 2017-11-24: qty 100

## 2017-11-24 MED ORDER — VANCOMYCIN HCL 1000 MG IV SOLR
INTRAVENOUS | Status: DC
  Filled 2017-11-24: qty 1000

## 2017-11-24 MED ORDER — NITROGLYCERIN IN D5W 200-5 MCG/ML-% IV SOLN: 2.0000 ug/min | INTRAVENOUS | Status: DC

## 2017-11-24 MED ORDER — MAGNESIUM SULFATE 50 % IJ SOLN
40.0000 meq | INTRAMUSCULAR | Status: DC
  Filled 2017-11-24: qty 9.85

## 2017-11-24 MED ORDER — EPINEPHRINE PF 1 MG/ML IJ SOLN
0.0000 ug/min | INTRAVENOUS | Status: DC
  Filled 2017-11-24: qty 4

## 2017-11-24 MED ORDER — SODIUM CHLORIDE 0.9 % IV SOLN
INTRAVENOUS | Status: DC
  Filled 2017-11-24: qty 30

## 2017-11-24 MED ORDER — TRANEXAMIC ACID (OHS) BOLUS VIA INFUSION
15.0000 mg/kg | INTRAVENOUS | Status: DC
  Filled 2017-11-24: qty 993

## 2017-11-24 MED ORDER — SODIUM CHLORIDE 0.9 % IV SOLN
INTRAVENOUS | Status: DC
  Filled 2017-11-24: qty 1

## 2017-11-24 MED ORDER — INSULIN REGULAR HUMAN 100 UNIT/ML IJ SOLN
INTRAMUSCULAR | Status: DC
  Filled 2017-11-24: qty 1

## 2017-11-24 MED ORDER — SODIUM CHLORIDE 0.9 % IV SOLN
1.5000 mg/kg/h | INTRAVENOUS | Status: DC
  Filled 2017-11-24: qty 25

## 2017-11-24 MED ORDER — VANCOMYCIN HCL 10 G IV SOLR
1250.0000 mg | INTRAVENOUS | Status: DC
  Filled 2017-11-24: qty 1250

## 2017-11-24 MED ORDER — BISACODYL 5 MG PO TBEC
5.0000 mg | DELAYED_RELEASE_TABLET | Freq: Once | ORAL | Status: AC
  Administered 2017-11-24: 5 mg via ORAL
  Filled 2017-11-24: qty 1

## 2017-11-24 MED ORDER — ALPRAZOLAM 0.25 MG PO TABS: 0.2500 mg | ORAL_TABLET | ORAL | Status: DC | PRN

## 2017-11-24 MED ORDER — PLASMA-LYTE 148 IV SOLN
INTRAVENOUS | Status: DC
  Filled 2017-11-24: qty 2.5

## 2017-11-24 MED ORDER — DEXTROSE 5 % IV SOLN
1.5000 g | INTRAVENOUS | Status: DC
  Filled 2017-11-24: qty 1.5

## 2017-11-24 MED ORDER — DEXMEDETOMIDINE HCL IN NACL 400 MCG/100ML IV SOLN
0.1000 ug/kg/h | INTRAVENOUS | Status: DC
  Filled 2017-11-24: qty 100

## 2017-11-24 MED ORDER — TRANEXAMIC ACID (OHS) PUMP PRIME SOLUTION
2.0000 mg/kg | INTRAVENOUS | Status: DC
  Filled 2017-11-24: qty 1.32

## 2017-11-24 MED ORDER — METOPROLOL TARTRATE 12.5 MG HALF TABLET
12.5000 mg | ORAL_TABLET | Freq: Once | ORAL | Status: AC
  Administered 2017-11-25: 12.5 mg via ORAL
  Filled 2017-11-24: qty 1

## 2017-11-24 MED ORDER — CHLORHEXIDINE GLUCONATE 0.12 % MT SOLN
15.0000 mL | Freq: Once | OROMUCOSAL | Status: AC
  Administered 2017-11-25: 15 mL via OROMUCOSAL
  Filled 2017-11-24: qty 15

## 2017-11-24 MED ORDER — NITROGLYCERIN IN D5W 200-5 MCG/ML-% IV SOLN
2.0000 ug/min | INTRAVENOUS | Status: DC
  Filled 2017-11-24: qty 250

## 2017-11-24 MED ORDER — CHLORHEXIDINE GLUCONATE 4 % EX LIQD
60.0000 mL | Freq: Once | CUTANEOUS | Status: AC
  Administered 2017-11-24: 4 via TOPICAL
  Filled 2017-11-24: qty 60

## 2017-11-24 MED ORDER — CEFUROXIME SODIUM 750 MG IJ SOLR
750.0000 mg | INTRAMUSCULAR | Status: DC
  Filled 2017-11-24: qty 750

## 2017-11-24 MED ORDER — SODIUM CHLORIDE 0.9 % IV SOLN
30.0000 ug/min | INTRAVENOUS | Status: DC
  Filled 2017-11-24: qty 2

## 2017-11-24 MED ORDER — DOPAMINE-DEXTROSE 3.2-5 MG/ML-% IV SOLN
0.0000 ug/kg/min | INTRAVENOUS | Status: DC
  Filled 2017-11-24: qty 250

## 2017-11-24 MED ORDER — TRANEXAMIC ACID 1000 MG/10ML IV SOLN
1.5000 mg/kg/h | INTRAVENOUS | Status: DC
  Filled 2017-11-24: qty 25

## 2017-11-24 MED ORDER — POTASSIUM CHLORIDE 2 MEQ/ML IV SOLN
80.0000 meq | INTRAVENOUS | Status: DC
  Filled 2017-11-24: qty 40

## 2017-11-24 MED ORDER — TEMAZEPAM 15 MG PO CAPS
15.0000 mg | ORAL_CAPSULE | Freq: Once | ORAL | Status: AC | PRN
  Administered 2017-11-24: 15 mg via ORAL
  Filled 2017-11-24: qty 1

## 2017-11-24 MED ORDER — CHLORHEXIDINE GLUCONATE 4 % EX LIQD
60.0000 mL | Freq: Once | CUTANEOUS | Status: AC
  Administered 2017-11-25: 4 via TOPICAL
  Filled 2017-11-24: qty 60

## 2017-11-24 MED ORDER — DEXTROSE 5 % IV SOLN
750.0000 mg | INTRAVENOUS | Status: DC
  Filled 2017-11-24: qty 750

## 2017-11-24 NOTE — Plan of Care (Signed)
  Health Behavior/Discharge Planning: Ability to manage health-related needs will improve 11/24/2017 1046 - Progressing by Loel LoftyLewis, Jaylynn Mcaleer P, RN

## 2017-11-24 NOTE — Anesthesia Preprocedure Evaluation (Addendum)
Anesthesia Evaluation  Patient identified by MRN, date of birth, ID band Patient awake    Reviewed: Allergy & Precautions, NPO status , Patient's Chart, lab work & pertinent test results  History of Anesthesia Complications (+) PONV  Airway Mallampati: IV  TM Distance: >3 FB Neck ROM: Limited    Dental  (+) Dental Advisory Given, Poor Dentition, Missing   Pulmonary neg pulmonary ROS,    breath sounds clear to auscultation       Cardiovascular hypertension, Pt. on medications (-) angina+ CAD (L Main and 3 vessel disease)   Rhythm:Regular Rate:Normal  11/23/17 ECHO: mild LVH with EF 60-65%, valves OK   Neuro/Psych negative neurological ROS     GI/Hepatic negative GI ROS, Neg liver ROS,   Endo/Other  negative endocrine ROS  Renal/GU negative Renal ROS     Musculoskeletal   Abdominal   Peds  Hematology  (+) HIV,   Anesthesia Other Findings   Reproductive/Obstetrics                            Anesthesia Physical Anesthesia Plan  ASA: III  Anesthesia Plan: General   Post-op Pain Management:    Induction: Intravenous  PONV Risk Score and Plan: 2 and Treatment may vary due to age or medical condition  Airway Management Planned: Oral ETT and Video Laryngoscope Planned  Additional Equipment: Arterial line, PA Cath, TEE and Ultrasound Guidance Line Placement  Intra-op Plan:   Post-operative Plan: Post-operative intubation/ventilation  Informed Consent: I have reviewed the patients History and Physical, chart, labs and discussed the procedure including the risks, benefits and alternatives for the proposed anesthesia with the patient or authorized representative who has indicated his/her understanding and acceptance.   Dental advisory given  Plan Discussed with: CRNA and Surgeon  Anesthesia Plan Comments: (Plan routine monitors, A line, PA catheter, GETA with VideoGlide intubation,  TEE, post op ventilation)        Anesthesia Quick Evaluation

## 2017-11-24 NOTE — Progress Notes (Signed)
Progress Note  Patient Name: Cathy Smith Date of Encounter: 11/24/2017  Primary Cardiologist: Nanetta BattyJonathan Berry, MD   Subjective   No cp or sob  Inpatient Medications    Scheduled Meds: . aspirin  81 mg Oral Daily  . atorvastatin  80 mg Oral q1800  . darunavir  800 mg Oral Q breakfast  . elvitegravir-cobicistat-emtricitabine-tenofovir  1 tablet Oral Q breakfast  . metoprolol tartrate  25 mg Oral BID  . sodium chloride flush  3 mL Intravenous Q12H   Continuous Infusions: . sodium chloride     PRN Meds: sodium chloride, acetaminophen, ALPRAZolam, morphine injection, ondansetron (ZOFRAN) IV, sodium chloride flush   Vital Signs    Vitals:   11/23/17 1053 11/23/17 1350 11/23/17 2012 11/24/17 0613  BP: 131/85 129/89 126/71 118/71  Pulse: (!) 105 98 86 84  Resp: 18  18 16   Temp: (!) 97.1 F (36.2 C)  (!) 97.5 F (36.4 C) 98 F (36.7 C)  TempSrc: Oral  Oral Oral  SpO2: 99%  96% 99%  Weight:    146 lb (66.2 kg)  Height:        Intake/Output Summary (Last 24 hours) at 11/24/2017 1124 Last data filed at 11/24/2017 0948 Gross per 24 hour  Intake 1015 ml  Output 0 ml  Net 1015 ml   Filed Weights   11/22/17 1734 11/23/17 0501 11/24/17 0613  Weight: 148 lb 9.4 oz (67.4 kg) 147 lb 1.6 oz (66.7 kg) 146 lb (66.2 kg)    Telemetry    Sinus tach 110s, now 80's - Personally Reviewed  ECG    NSR. Low voltage  - Personally Reviewed  Physical Exam   GEN: Well nourished, well developed, in no acute distress  HEENT: normal  Neck: no JVD, carotid bruits, or masses Cardiac: RRR; no murmurs, rubs, or gallops,no edema  Respiratory:  clear to auscultation bilaterally, normal work of breathing GI: soft, nontender, nondistended, + BS MS: no deformity or atrophy  Skin: warm and dry, no rash Neuro:  Alert and Oriented x 3, Strength and sensation are intact Psych: euthymic mood, full affect   Labs    Chemistry Recent Labs  Lab 11/17/17 1751 11/19/17 1216 11/23/17 0613    NA 137 142 137  K 3.9 4.8 4.2  CL 101 100 103  CO2 26 24 22   GLUCOSE 105* 117* 118*  BUN 10 11 8   CREATININE 0.84 1.10* 0.86  CALCIUM 10.0 9.7 9.6  PROT 8.8*  --  7.6  ALBUMIN 4.6  --  4.1  AST 30  --  25  ALT 27  --  28  ALKPHOS 69  --  61  BILITOT 0.7  --  1.0  GFRNONAA >60 57* >60  GFRAA >60 66 >60  ANIONGAP 10  --  12     Hematology Recent Labs  Lab 11/17/17 1751 11/19/17 1216 11/23/17 0613  WBC 5.2 3.9 6.2  RBC 4.16 4.15 3.99  HGB 13.2 13.2 12.8  HCT 40.1 39.1 37.7  MCV 96.4 94 94.5  MCH 31.7 31.8 32.1  MCHC 32.9 33.8 34.0  RDW 12.0 13.0 12.0  PLT 220 227 192    Cardiac EnzymesNo results for input(s): TROPONINI in the last 168 hours. No results for input(s): TROPIPOC in the last 168 hours.   BNPNo results for input(s): BNP, PROBNP in the last 168 hours.   DDimer No results for input(s): DDIMER in the last 168 hours.   Radiology    Dg Chest 2  View  Result Date: 11/23/2017 CLINICAL DATA:  Preoperative study for CABG. EXAM: CHEST  2 VIEW COMPARISON:  Chest x-ray dated November 19, 2017. FINDINGS: The heart size and mediastinal contours are within normal limits. Normal pulmonary vascularity. No focal consolidation, pleural effusion, or pneumothorax. No acute osseous abnormality. IMPRESSION: No active cardiopulmonary disease. Electronically Signed   By: Obie Dredge M.D.   On: 11/23/2017 13:20    Cardiac Studies   Procedures   LEFT HEART CATH AND CORONARY ANGIOGRAPHY  Conclusion     Ost RPDA to RPDA lesion is 95% stenosed.  Ost LM lesion is 75% stenosed.  Mid LM to Dist LM lesion is 50% stenosed.  Prox LAD lesion is 100% stenosed.  Ost 1st Mrg lesion is 95% stenosed.  The left ventricular systolic function is normal.  LV end diastolic pressure is normal.  The left ventricular ejection fraction is 55-65% by visual estimate.     2D echo 11/22/17 - - Normal LV size with mild LV hypertrophy. EF 60-65%. Normal RV   size and systolic function.  No significant valvular   abnormalities.  Patient Profile     52 y.o. female with HIV and h/o HLD admitted for elective LHC in the setting of recent CP and high risk NST. LHC 11/22/17 showed severe multivessel CAD. Plan is for CABG 11/25/17 by Dr. Cornelius Moras.   Assessment & Plan    1. CAD: multivessel disease as outlined above. Plan is for CABG on 11/25/17. LM and LAD disease. Currently CP free. Continue ASA and high dose statin. Now on metoprolol 25 mg BID for sinus tachycardia. Normal EF. No ICA stenosis on carotid duplex  2. HIV: antiretroviral's ordered. No change  3. HLD: last lipid panel was 01/2017. LDL was 87. Will get updated FLP in the AM. Continue statin therapy with Lipitor. Goal LDL is < 70 mg/DL. No change   For questions or updates, please contact [facility] Please consult [email or web address] for contact info under Cardiology/STEMI.      Signed, Donato Schultz, MD  11/24/2017, 11:24 AM

## 2017-11-24 NOTE — Progress Notes (Signed)
      [address]       [place] [id number]             [phone]     CARDIOTHORACIC SURGERY PROGRESS NOTE  2 Days Post-Op  S/P Procedure(s) (LRB): LEFT HEART CATH AND CORONARY ANGIOGRAPHY (N/A)  Subjective: No chest pain or SOB  Objective: Vital signs in last 24 hours: Temp:  [97.5 F (36.4 C)-98 F (36.7 C)] 98 F (36.7 C) (01/16 1139) Pulse Rate:  [84-94] 94 (01/16 1139) Cardiac Rhythm: Normal sinus rhythm (01/16 1048) Resp:  [16-18] 18 (01/16 1139) BP: (101-126)/(71-75) 101/75 (01/16 1139) SpO2:  [95 %-99 %] 95 % (01/16 1139) Weight:  [146 lb (66.2 kg)] 146 lb (66.2 kg) (01/16 [id number])  Physical Exam:  Rhythm:   sinus  Breath sounds: clear  Heart sounds:  RRR  Incisions:  n/a  Abdomen:  soft  Extremities:  warm   Intake/Output from previous day: 01/15 0701 - 01/16 0700 In: 840 [P.O.:840] Out: 300 [Urine:300] Intake/Output this shift: Total I/O In: 415 [P.O.:415] Out: -   Lab Results: Recent Labs    11/23/17 0613  WBC 6.2  HGB 12.8  HCT 37.7  PLT 192   BMET:  Recent Labs    11/23/17 0613  NA 137  K 4.2  CL 103  CO2 22  GLUCOSE 118*  BUN 8  CREATININE 0.86  CALCIUM 9.6    CBG (last 3)  No results for input(s): GLUCAP in the last 72 hours. PT/INR:   Recent Labs    11/23/17 0613  LABPROT 13.3  INR 1.02    CXR:  CHEST  2 VIEW  COMPARISON:  Chest x-ray dated November 19, 2017.  FINDINGS: The heart size and mediastinal contours are within normal limits. Normal pulmonary vascularity. No focal consolidation, pleural effusion, or pneumothorax. No acute osseous abnormality.  IMPRESSION: No active cardiopulmonary disease.   Electronically Signed   By: Obie DredgeWilliam T Derry M.D.   On: 11/23/2017 13:20  Assessment/Plan: S/P Procedure(s) (LRB): LEFT HEART CATH AND CORONARY ANGIOGRAPHY (N/A)  I have again reviewed the indications, risks and potential benefits of CABG with the patient and her family.  Expectations for  her postoperative recovery have been discussed.  All questions answered.  For OR tomorrow  I spent in excess of 15 minutes during the conduct of this hospital encounter and >50% of this time involved direct face-to-face encounter with the patient for counseling and/or coordination of their care.   Purcell Nailslarence H Curtiss Mahmood, MD 11/24/2017 6:26 PM

## 2017-11-25 ENCOUNTER — Inpatient Hospital Stay (HOSPITAL_COMMUNITY): Payer: Medicare HMO | Admitting: Anesthesiology

## 2017-11-25 ENCOUNTER — Inpatient Hospital Stay (HOSPITAL_COMMUNITY)
Admission: AD | Disposition: A | Discharge status: Home Health | Payer: Self-pay | Source: Ambulatory Visit | Attending: Thoracic Surgery (Cardiothoracic Vascular Surgery)

## 2017-11-25 ENCOUNTER — Encounter (HOSPITAL_COMMUNITY): Payer: Self-pay | Admitting: Certified Registered Nurse Anesthetist

## 2017-11-25 ENCOUNTER — Inpatient Hospital Stay (HOSPITAL_COMMUNITY): Payer: Medicare HMO

## 2017-11-25 DIAGNOSIS — Z951 Presence of aortocoronary bypass graft: Secondary | ICD-10-CM

## 2017-11-25 DIAGNOSIS — I25110 Atherosclerotic heart disease of native coronary artery with unstable angina pectoris: Secondary | ICD-10-CM

## 2017-11-25 HISTORY — PX: CORONARY ARTERY BYPASS GRAFT: SHX141

## 2017-11-25 HISTORY — PX: TEE WITHOUT CARDIOVERSION: SHX5443

## 2017-11-25 LAB — POCT I-STAT 3, ART BLOOD GAS (G3+)
ACID-BASE DEFICIT: 2 mmol/L (ref 0.0–2.0)
Acid-base deficit: 5 mmol/L — ABNORMAL HIGH (ref 0.0–2.0)
Acid-base deficit: 3 mmol/L — ABNORMAL HIGH (ref 0.0–2.0)
BICARBONATE: 23.2 mmol/L (ref 20.0–28.0)
BICARBONATE: 22.1 mmol/L (ref 20.0–28.0)
Bicarbonate: 23.5 mmol/L (ref 20.0–28.0)
O2 SAT: 99 %
O2 Saturation: 100 %
O2 Saturation: 99 %
PCO2 ART: 48.8 mmHg — AB (ref 32.0–48.0)
PH ART: 7.396 (ref 7.350–7.450)
PH ART: 7.282 — AB (ref 7.350–7.450)
PO2 ART: 184 mmHg — AB (ref 83.0–108.0)
Patient temperature: 34.6
Patient temperature: 36.9
Patient temperature: 37
TCO2: 24 mmol/L (ref 22–32)
TCO2: 24 mmol/L (ref 22–32)
TCO2: 25 mmol/L (ref 22–32)
pCO2 arterial: 36.9 mmHg (ref 32.0–48.0)
pCO2 arterial: 46.8 mmHg (ref 32.0–48.0)
pH, Arterial: 7.291 — ABNORMAL LOW (ref 7.350–7.450)
pO2, Arterial: 137 mmHg — ABNORMAL HIGH (ref 83.0–108.0)
pO2, Arterial: 155 mmHg — ABNORMAL HIGH (ref 83.0–108.0)

## 2017-11-25 LAB — CBC
HCT: 23 % — ABNORMAL LOW (ref 36.0–46.0)
HCT: 32 % — ABNORMAL LOW (ref 36.0–46.0)
HEMOGLOBIN: 7.7 g/dL — AB (ref 12.0–15.0)
HEMOGLOBIN: 10.6 g/dL — AB (ref 12.0–15.0)
MCH: 31.8 pg (ref 26.0–34.0)
MCH: 29 pg (ref 26.0–34.0)
MCHC: 33.5 g/dL (ref 30.0–36.0)
MCHC: 33.1 g/dL (ref 30.0–36.0)
MCV: 95 fL (ref 78.0–100.0)
MCV: 87.7 fL (ref 78.0–100.0)
PLATELETS: 95 10*3/uL — AB (ref 150–400)
PLATELETS: 100 10*3/uL — AB (ref 150–400)
RBC: 2.42 MIL/uL — ABNORMAL LOW (ref 3.87–5.11)
RBC: 3.65 MIL/uL — AB (ref 3.87–5.11)
RDW: 12.2 % (ref 11.5–15.5)
RDW: 19.7 % — ABNORMAL HIGH (ref 11.5–15.5)
WBC: 3.6 10*3/uL — ABNORMAL LOW (ref 4.0–10.5)
WBC: 10 10*3/uL (ref 4.0–10.5)

## 2017-11-25 LAB — POCT I-STAT 4, (NA,K, GLUC, HGB,HCT)
Glucose, Bld: 107 mg/dL — ABNORMAL HIGH (ref 65–99)
HCT: 23 % — ABNORMAL LOW (ref 36.0–46.0)
Hemoglobin: 7.8 g/dL — ABNORMAL LOW (ref 12.0–15.0)
Potassium: 4 mmol/L (ref 3.5–5.1)
Sodium: 147 mmol/L — ABNORMAL HIGH (ref 135–145)

## 2017-11-25 LAB — POCT I-STAT, CHEM 8
BUN: 9 mg/dL (ref 6–20)
CHLORIDE: 110 mmol/L (ref 101–111)
Calcium, Ion: 1.14 mmol/L — ABNORMAL LOW (ref 1.15–1.40)
Creatinine, Ser: 0.7 mg/dL (ref 0.44–1.00)
GLUCOSE: 158 mg/dL — AB (ref 65–99)
HCT: 31 % — ABNORMAL LOW (ref 36.0–46.0)
Hemoglobin: 10.5 g/dL — ABNORMAL LOW (ref 12.0–15.0)
POTASSIUM: 4.8 mmol/L (ref 3.5–5.1)
Sodium: 142 mmol/L (ref 135–145)
TCO2: 25 mmol/L (ref 22–32)

## 2017-11-25 LAB — PROTIME-INR
INR: 1.49
PROTHROMBIN TIME: 17.8 s — AB (ref 11.4–15.2)

## 2017-11-25 LAB — GLUCOSE, CAPILLARY
GLUCOSE-CAPILLARY: 97 mg/dL (ref 65–99)
GLUCOSE-CAPILLARY: 99 mg/dL (ref 65–99)
GLUCOSE-CAPILLARY: 89 mg/dL (ref 65–99)
GLUCOSE-CAPILLARY: 132 mg/dL — AB (ref 65–99)
Glucose-Capillary: 112 mg/dL — ABNORMAL HIGH (ref 65–99)

## 2017-11-25 LAB — CREATININE, SERUM: Creatinine, Ser: 0.83 mg/dL (ref 0.44–1.00)

## 2017-11-25 LAB — APTT: aPTT: 41 seconds — ABNORMAL HIGH (ref 24–36)

## 2017-11-25 LAB — MAGNESIUM: MAGNESIUM: 3.1 mg/dL — AB (ref 1.7–2.4)

## 2017-11-25 LAB — PREPARE RBC (CROSSMATCH)

## 2017-11-25 LAB — PLATELET COUNT: Platelets: 138 10*3/uL — ABNORMAL LOW (ref 150–400)

## 2017-11-25 LAB — HEMOGLOBIN AND HEMATOCRIT, BLOOD
HCT: 22.7 % — ABNORMAL LOW (ref 36.0–46.0)
Hemoglobin: 7.8 g/dL — ABNORMAL LOW (ref 12.0–15.0)

## 2017-11-25 SURGERY — CORONARY ARTERY BYPASS GRAFTING (CABG)
Anesthesia: General | Site: Chest

## 2017-11-25 MED ORDER — MILRINONE LACTATE IN DEXTROSE 20-5 MG/100ML-% IV SOLN
0.1250 ug/kg/min | INTRAVENOUS | Status: DC
  Filled 2017-11-25: qty 100

## 2017-11-25 MED ORDER — ORAL CARE MOUTH RINSE
15.0000 mL | Freq: Four times a day (QID) | OROMUCOSAL | Status: DC
  Administered 2017-11-26 – 2017-11-27 (×4): 15 mL via OROMUCOSAL

## 2017-11-25 MED ORDER — SODIUM CHLORIDE 0.9% FLUSH: 10.0000 mL | INTRAVENOUS | Status: DC | PRN

## 2017-11-25 MED ORDER — VANCOMYCIN HCL 1000 MG IV SOLR
INTRAVENOUS | Status: AC
  Administered 2017-11-25: 1000 mL
  Filled 2017-11-25: qty 1000

## 2017-11-25 MED ORDER — MIDAZOLAM HCL 10 MG/2ML IJ SOLN
INTRAMUSCULAR | Status: AC
  Filled 2017-11-25: qty 2

## 2017-11-25 MED ORDER — METOPROLOL TARTRATE 5 MG/5ML IV SOLN: 2.5000 mg | INTRAVENOUS | Status: DC | PRN

## 2017-11-25 MED ORDER — SODIUM CHLORIDE 0.9 % IV SOLN
INTRAVENOUS | Status: DC | PRN
  Administered 2017-11-25: 0.2 ug/kg/h via INTRAVENOUS

## 2017-11-25 MED ORDER — PROPOFOL 10 MG/ML IV BOLUS
INTRAVENOUS | Status: DC | PRN
  Administered 2017-11-25: 20 mg via INTRAVENOUS

## 2017-11-25 MED ORDER — ACETAMINOPHEN 160 MG/5ML PO SOLN
650.0000 mg | Freq: Once | ORAL | Status: AC
  Administered 2017-11-25: 650 mg

## 2017-11-25 MED ORDER — DEXMEDETOMIDINE HCL IN NACL 400 MCG/100ML IV SOLN
0.1000 ug/kg/h | INTRAVENOUS | Status: DC
  Filled 2017-11-25: qty 100

## 2017-11-25 MED ORDER — SODIUM CHLORIDE 0.9 % IV SOLN
30.0000 ug/min | INTRAVENOUS | Status: DC
  Filled 2017-11-25: qty 2

## 2017-11-25 MED ORDER — LACTATED RINGERS IV SOLN
INTRAVENOUS | Status: DC | PRN
  Administered 2017-11-25 (×2): via INTRAVENOUS

## 2017-11-25 MED ORDER — POTASSIUM CHLORIDE 2 MEQ/ML IV SOLN
80.0000 meq | INTRAVENOUS | Status: DC
  Filled 2017-11-25: qty 40

## 2017-11-25 MED ORDER — LACTATED RINGERS IV SOLN
INTRAVENOUS | Status: DC | PRN
  Administered 2017-11-25: 07:00:00 via INTRAVENOUS

## 2017-11-25 MED ORDER — MIDAZOLAM HCL 2 MG/2ML IJ SOLN: 2.0000 mg | INTRAMUSCULAR | Status: DC | PRN

## 2017-11-25 MED ORDER — INSULIN REGULAR BOLUS VIA INFUSION
0.0000 [IU] | Freq: Three times a day (TID) | INTRAVENOUS | Status: DC
  Filled 2017-11-25: qty 10

## 2017-11-25 MED ORDER — MAGNESIUM SULFATE 50 % IJ SOLN
40.0000 meq | INTRAMUSCULAR | Status: DC
  Filled 2017-11-25: qty 9.85

## 2017-11-25 MED ORDER — SODIUM CHLORIDE 0.9% FLUSH: 3.0000 mL | INTRAVENOUS | Status: DC | PRN

## 2017-11-25 MED ORDER — SODIUM BICARBONATE 8.4 % IV SOLN
50.0000 meq | Freq: Once | INTRAVENOUS | Status: AC
  Administered 2017-11-25: 50 meq via INTRAVENOUS

## 2017-11-25 MED ORDER — SODIUM CHLORIDE 0.9 % IV SOLN
INTRAVENOUS | Status: DC
  Filled 2017-11-25: qty 1

## 2017-11-25 MED ORDER — ROCURONIUM BROMIDE 10 MG/ML (PF) SYRINGE
PREFILLED_SYRINGE | INTRAVENOUS | Status: AC
  Filled 2017-11-25: qty 5

## 2017-11-25 MED ORDER — SODIUM CHLORIDE 0.9 % IV SOLN
0.0000 ug/min | INTRAVENOUS | Status: DC
  Administered 2017-11-26: 30 ug/min via INTRAVENOUS
  Filled 2017-11-25: qty 2

## 2017-11-25 MED ORDER — PANTOPRAZOLE SODIUM 40 MG PO TBEC
40.0000 mg | DELAYED_RELEASE_TABLET | Freq: Every day | ORAL | Status: DC
  Administered 2017-11-27 – 2017-11-30 (×4): 40 mg via ORAL
  Filled 2017-11-25 (×4): qty 1

## 2017-11-25 MED ORDER — LACTATED RINGERS IV SOLN: 500.0000 mL | Freq: Once | INTRAVENOUS | Status: DC | PRN

## 2017-11-25 MED ORDER — SODIUM CHLORIDE 0.9 % IV SOLN
INTRAVENOUS | Status: DC
  Administered 2017-11-25: 13:00:00 via INTRAVENOUS

## 2017-11-25 MED ORDER — INSULIN ASPART 100 UNIT/ML ~~LOC~~ SOLN
0.0000 [IU] | SUBCUTANEOUS | Status: DC
  Administered 2017-11-25: 2 [IU] via SUBCUTANEOUS
  Administered 2017-11-25: 4 [IU] via SUBCUTANEOUS
  Administered 2017-11-26 (×3): 2 [IU] via SUBCUTANEOUS

## 2017-11-25 MED ORDER — SODIUM CHLORIDE 0.9 % IV SOLN: Freq: Once | INTRAVENOUS | Status: AC

## 2017-11-25 MED ORDER — SODIUM CHLORIDE 0.9% FLUSH
3.0000 mL | Freq: Two times a day (BID) | INTRAVENOUS | Status: DC
  Administered 2017-11-26 – 2017-11-28 (×5): 3 mL via INTRAVENOUS

## 2017-11-25 MED ORDER — SODIUM CHLORIDE 0.9 % IV SOLN
0.0000 ug/kg/h | INTRAVENOUS | Status: DC
  Filled 2017-11-25: qty 2

## 2017-11-25 MED ORDER — DEXTROSE 5 % IV SOLN
0.0000 ug/min | INTRAVENOUS | Status: DC
  Filled 2017-11-25: qty 4

## 2017-11-25 MED ORDER — POTASSIUM CHLORIDE 10 MEQ/50ML IV SOLN: 10.0000 meq | INTRAVENOUS | Status: AC

## 2017-11-25 MED ORDER — TRAMADOL HCL 50 MG PO TABS: 50.0000 mg | ORAL_TABLET | ORAL | Status: DC | PRN

## 2017-11-25 MED ORDER — SODIUM CHLORIDE 0.45 % IV SOLN
INTRAVENOUS | Status: DC | PRN
  Administered 2017-11-25: 13:00:00 via INTRAVENOUS

## 2017-11-25 MED ORDER — ACETAMINOPHEN 500 MG PO TABS
1000.0000 mg | ORAL_TABLET | Freq: Four times a day (QID) | ORAL | Status: DC
  Administered 2017-11-25 – 2017-11-29 (×12): 1000 mg via ORAL
  Filled 2017-11-25 (×14): qty 2

## 2017-11-25 MED ORDER — SODIUM CHLORIDE 0.9 % IV SOLN
INTRAVENOUS | Status: DC | PRN
  Administered 2017-11-25: 1.1 [IU]/h via INTRAVENOUS

## 2017-11-25 MED ORDER — SODIUM CHLORIDE 0.9 % IV SOLN
250.0000 mL | INTRAVENOUS | Status: DC
  Administered 2017-11-25: 250 mL via INTRAVENOUS

## 2017-11-25 MED ORDER — METOPROLOL TARTRATE 12.5 MG HALF TABLET
12.5000 mg | ORAL_TABLET | Freq: Two times a day (BID) | ORAL | Status: DC
  Administered 2017-11-26 – 2017-11-28 (×5): 12.5 mg via ORAL
  Filled 2017-11-25 (×5): qty 1

## 2017-11-25 MED ORDER — TRANEXAMIC ACID 1000 MG/10ML IV SOLN
1.5000 mg/kg/h | INTRAVENOUS | Status: AC
  Administered 2017-11-25: 1.5 mg/kg/h via INTRAVENOUS
  Filled 2017-11-25: qty 25

## 2017-11-25 MED ORDER — METOPROLOL TARTRATE 25 MG/10 ML ORAL SUSPENSION: 12.5000 mg | Freq: Two times a day (BID) | ORAL | Status: DC

## 2017-11-25 MED ORDER — DEXTROSE 5 % IV SOLN
INTRAVENOUS | Status: DC | PRN
  Administered 2017-11-25: 750 mg via INTRAVENOUS

## 2017-11-25 MED ORDER — PLASMA-LYTE 148 IV SOLN
INTRAVENOUS | Status: AC
  Administered 2017-11-25: 500 mL
  Filled 2017-11-25: qty 2.5

## 2017-11-25 MED ORDER — HEMOSTATIC AGENTS (NO CHARGE) OPTIME
TOPICAL | Status: DC | PRN
  Administered 2017-11-25: 1 via TOPICAL

## 2017-11-25 MED ORDER — ALBUMIN HUMAN 5 % IV SOLN
INTRAVENOUS | Status: DC | PRN
  Administered 2017-11-25 (×3): via INTRAVENOUS

## 2017-11-25 MED ORDER — PHENYLEPHRINE HCL 10 MG/ML IJ SOLN
INTRAMUSCULAR | Status: DC | PRN
  Administered 2017-11-25: 20 ug/min via INTRAVENOUS

## 2017-11-25 MED ORDER — MAGNESIUM SULFATE 4 GM/100ML IV SOLN
4.0000 g | Freq: Once | INTRAVENOUS | Status: AC
  Administered 2017-11-25: 4 g via INTRAVENOUS
  Filled 2017-11-25: qty 100

## 2017-11-25 MED ORDER — NITROGLYCERIN IN D5W 200-5 MCG/ML-% IV SOLN
0.0000 ug/min | INTRAVENOUS | Status: DC
Start: 2017-11-25 — End: 2017-11-26

## 2017-11-25 MED ORDER — PROTAMINE SULFATE 10 MG/ML IV SOLN
INTRAVENOUS | Status: DC | PRN
  Administered 2017-11-25: 30 mg via INTRAVENOUS
  Administered 2017-11-25: 50 mg via INTRAVENOUS
  Administered 2017-11-25: 40 mg via INTRAVENOUS
  Administered 2017-11-25: 30 mg via INTRAVENOUS

## 2017-11-25 MED ORDER — PROPOFOL 10 MG/ML IV BOLUS
INTRAVENOUS | Status: AC
  Filled 2017-11-25: qty 20

## 2017-11-25 MED ORDER — PHENYLEPHRINE HCL 10 MG/ML IJ SOLN
INTRAMUSCULAR | Status: DC | PRN
  Administered 2017-11-25 (×3): 40 ug via INTRAVENOUS

## 2017-11-25 MED ORDER — CHLORHEXIDINE GLUCONATE CLOTH 2 % EX PADS
6.0000 | MEDICATED_PAD | Freq: Every day | CUTANEOUS | Status: DC
  Administered 2017-11-26 – 2017-11-28 (×2): 6 via TOPICAL

## 2017-11-25 MED ORDER — ASPIRIN 81 MG PO CHEW: 324.0000 mg | CHEWABLE_TABLET | Freq: Every day | ORAL | Status: DC

## 2017-11-25 MED ORDER — VANCOMYCIN HCL IN DEXTROSE 1-5 GM/200ML-% IV SOLN
1000.0000 mg | Freq: Once | INTRAVENOUS | Status: AC
  Administered 2017-11-25: 1000 mg via INTRAVENOUS
  Filled 2017-11-25: qty 200

## 2017-11-25 MED ORDER — FENTANYL CITRATE (PF) 250 MCG/5ML IJ SOLN
INTRAMUSCULAR | Status: AC
  Filled 2017-11-25: qty 25

## 2017-11-25 MED ORDER — DOPAMINE-DEXTROSE 3.2-5 MG/ML-% IV SOLN
0.0000 ug/kg/min | INTRAVENOUS | Status: DC
  Filled 2017-11-25: qty 250

## 2017-11-25 MED ORDER — NITROGLYCERIN IN D5W 200-5 MCG/ML-% IV SOLN
2.0000 ug/min | INTRAVENOUS | Status: AC
  Administered 2017-11-25: 16.6 ug/min via INTRAVENOUS
  Filled 2017-11-25: qty 250

## 2017-11-25 MED ORDER — SODIUM CHLORIDE 0.9 % IV SOLN
INTRAVENOUS | Status: DC
  Filled 2017-11-25: qty 30

## 2017-11-25 MED ORDER — MORPHINE SULFATE (PF) 4 MG/ML IV SOLN
1.0000 mg | INTRAVENOUS | Status: DC | PRN
  Administered 2017-11-25: 4 mg via INTRAVENOUS
  Filled 2017-11-25: qty 1

## 2017-11-25 MED ORDER — CHLORHEXIDINE GLUCONATE 0.12 % MT SOLN
15.0000 mL | OROMUCOSAL | Status: AC
  Administered 2017-11-25: 15 mL via OROMUCOSAL
  Filled 2017-11-25: qty 15

## 2017-11-25 MED ORDER — FENTANYL CITRATE (PF) 250 MCG/5ML IJ SOLN
INTRAMUSCULAR | Status: DC | PRN
  Administered 2017-11-25: 250 ug via INTRAVENOUS
  Administered 2017-11-25: 200 ug via INTRAVENOUS
  Administered 2017-11-25: 250 ug via INTRAVENOUS
  Administered 2017-11-25: 50 ug via INTRAVENOUS
  Administered 2017-11-25 (×2): 250 ug via INTRAVENOUS

## 2017-11-25 MED ORDER — 0.9 % SODIUM CHLORIDE (POUR BTL) OPTIME
TOPICAL | Status: DC | PRN
  Administered 2017-11-25: 5000 mL
  Administered 2017-11-25: 1000 mL

## 2017-11-25 MED ORDER — MORPHINE SULFATE (PF) 2 MG/ML IV SOLN: 1.0000 mg | INTRAVENOUS | Status: DC | PRN

## 2017-11-25 MED ORDER — SODIUM CHLORIDE 0.9 % IJ SOLN
INTRAMUSCULAR | Status: AC
  Filled 2017-11-25: qty 20

## 2017-11-25 MED ORDER — MIDAZOLAM HCL 5 MG/5ML IJ SOLN
INTRAMUSCULAR | Status: DC | PRN
  Administered 2017-11-25: 1 mg via INTRAVENOUS
  Administered 2017-11-25: 2 mg via INTRAVENOUS
  Administered 2017-11-25: 4 mg via INTRAVENOUS
  Administered 2017-11-25: 2 mg via INTRAVENOUS
  Administered 2017-11-25: 1 mg via INTRAVENOUS

## 2017-11-25 MED ORDER — ORAL CARE MOUTH RINSE: 15.0000 mL | Freq: Four times a day (QID) | OROMUCOSAL | Status: DC

## 2017-11-25 MED ORDER — ACETAMINOPHEN 160 MG/5ML PO SOLN: 1000.0000 mg | Freq: Four times a day (QID) | ORAL | Status: DC

## 2017-11-25 MED ORDER — ACETAMINOPHEN 650 MG RE SUPP
650.0000 mg | Freq: Once | RECTAL | Status: AC
  Filled 2017-11-25: qty 1

## 2017-11-25 MED ORDER — VANCOMYCIN HCL 10 G IV SOLR
1250.0000 mg | INTRAVENOUS | Status: AC
  Administered 2017-11-25: 1250 mg via INTRAVENOUS
  Filled 2017-11-25: qty 1250

## 2017-11-25 MED ORDER — TRANEXAMIC ACID (OHS) BOLUS VIA INFUSION
15.0000 mg/kg | INTRAVENOUS | Status: AC
  Administered 2017-11-25: 990 mg via INTRAVENOUS
  Filled 2017-11-25: qty 990

## 2017-11-25 MED ORDER — ASPIRIN EC 325 MG PO TBEC
325.0000 mg | DELAYED_RELEASE_TABLET | Freq: Every day | ORAL | Status: DC
  Administered 2017-11-26 – 2017-11-30 (×5): 325 mg via ORAL
  Filled 2017-11-25 (×5): qty 1

## 2017-11-25 MED ORDER — DEXTROSE 5 % IV SOLN
1.5000 g | Freq: Two times a day (BID) | INTRAVENOUS | Status: AC
  Administered 2017-11-25 – 2017-11-27 (×4): 1.5 g via INTRAVENOUS
  Filled 2017-11-25 (×4): qty 1.5

## 2017-11-25 MED ORDER — CHLORHEXIDINE GLUCONATE 0.12% ORAL RINSE (MEDLINE KIT)
15.0000 mL | Freq: Two times a day (BID) | OROMUCOSAL | Status: DC
  Administered 2017-11-25 – 2017-11-29 (×6): 15 mL via OROMUCOSAL

## 2017-11-25 MED ORDER — TRANEXAMIC ACID (OHS) PUMP PRIME SOLUTION
2.0000 mg/kg | INTRAVENOUS | Status: DC
  Filled 2017-11-25: qty 1.32

## 2017-11-25 MED ORDER — DEXTROSE 5 % IV SOLN
1.5000 g | INTRAVENOUS | Status: AC
  Administered 2017-11-25: 1.5 g via INTRAVENOUS
  Filled 2017-11-25: qty 1.5

## 2017-11-25 MED ORDER — DOCUSATE SODIUM 100 MG PO CAPS
200.0000 mg | ORAL_CAPSULE | Freq: Every day | ORAL | Status: DC
  Administered 2017-11-26 – 2017-11-30 (×4): 200 mg via ORAL
  Filled 2017-11-25 (×4): qty 2

## 2017-11-25 MED ORDER — LACTATED RINGERS IV SOLN
INTRAVENOUS | Status: DC | PRN
  Administered 2017-11-25: 08:00:00 via INTRAVENOUS

## 2017-11-25 MED ORDER — SODIUM CHLORIDE 0.9% FLUSH
10.0000 mL | Freq: Two times a day (BID) | INTRAVENOUS | Status: DC
  Administered 2017-11-26 – 2017-11-27 (×2): 10 mL

## 2017-11-25 MED ORDER — HEPARIN SODIUM (PORCINE) 1000 UNIT/ML IJ SOLN
INTRAMUSCULAR | Status: DC | PRN
  Administered 2017-11-25: 17 mL via INTRAVENOUS
  Administered 2017-11-25: 3 mL via INTRAVENOUS

## 2017-11-25 MED ORDER — DEXTROSE 5 % IV SOLN
750.0000 mg | INTRAVENOUS | Status: DC
  Filled 2017-11-25: qty 750

## 2017-11-25 MED ORDER — ROCURONIUM BROMIDE 100 MG/10ML IV SOLN
INTRAVENOUS | Status: DC | PRN
  Administered 2017-11-25 (×3): 50 mg via INTRAVENOUS

## 2017-11-25 MED ORDER — ONDANSETRON HCL 4 MG/2ML IJ SOLN
4.0000 mg | Freq: Four times a day (QID) | INTRAMUSCULAR | Status: DC | PRN
  Administered 2017-11-26 (×2): 4 mg via INTRAVENOUS
  Filled 2017-11-25 (×2): qty 2

## 2017-11-25 MED ORDER — MORPHINE SULFATE (PF) 4 MG/ML IV SOLN
1.0000 mg | INTRAVENOUS | Status: DC | PRN
  Administered 2017-11-25 – 2017-11-26 (×2): 2 mg via INTRAVENOUS
  Filled 2017-11-25 (×2): qty 1

## 2017-11-25 MED ORDER — LACTATED RINGERS IV SOLN: INTRAVENOUS | Status: DC

## 2017-11-25 MED ORDER — BISACODYL 5 MG PO TBEC
10.0000 mg | DELAYED_RELEASE_TABLET | Freq: Every day | ORAL | Status: DC
  Administered 2017-11-26 – 2017-11-30 (×4): 10 mg via ORAL
  Filled 2017-11-25 (×4): qty 2

## 2017-11-25 MED ORDER — SODIUM CHLORIDE 0.9 % IJ SOLN
OROMUCOSAL | Status: DC | PRN
  Administered 2017-11-25 (×3): 4 mL via TOPICAL

## 2017-11-25 MED ORDER — SODIUM CHLORIDE 0.9 % IV SOLN
INTRAVENOUS | Status: AC
  Administered 2017-11-25: 13:00:00 via INTRAVENOUS

## 2017-11-25 MED ORDER — ALBUMIN HUMAN 5 % IV SOLN
250.0000 mL | INTRAVENOUS | Status: AC | PRN
  Administered 2017-11-25 – 2017-11-26 (×4): 250 mL via INTRAVENOUS
  Filled 2017-11-25: qty 250

## 2017-11-25 MED ORDER — FAMOTIDINE IN NACL 20-0.9 MG/50ML-% IV SOLN
20.0000 mg | Freq: Two times a day (BID) | INTRAVENOUS | Status: DC
  Administered 2017-11-25: 20 mg via INTRAVENOUS

## 2017-11-25 MED ORDER — OXYCODONE HCL 5 MG PO TABS
5.0000 mg | ORAL_TABLET | ORAL | Status: DC | PRN
  Administered 2017-11-26: 10 mg via ORAL
  Administered 2017-11-27: 5 mg via ORAL
  Filled 2017-11-25: qty 2
  Filled 2017-11-25: qty 1

## 2017-11-25 MED ORDER — BISACODYL 10 MG RE SUPP: 10.0000 mg | Freq: Every day | RECTAL | Status: DC

## 2017-11-25 SURGICAL SUPPLY — 103 items
ADH SKN CLS APL DERMABOND .7 (GAUZE/BANDAGES/DRESSINGS) ×2
BAG DECANTER FOR FLEXI CONT (MISCELLANEOUS) ×6 IMPLANT
BANDAGE ACE 4X5 VEL STRL LF (GAUZE/BANDAGES/DRESSINGS) ×3 IMPLANT
BANDAGE ACE 6X5 VEL STRL LF (GAUZE/BANDAGES/DRESSINGS) ×3 IMPLANT
BASKET HEART (ORDER IN 25'S) (MISCELLANEOUS) ×1
BASKET HEART (ORDER IN 25S) (MISCELLANEOUS) ×2 IMPLANT
BLADE CLIPPER SURG (BLADE) ×1 IMPLANT
BLADE STERNUM SYSTEM 6 (BLADE) ×3 IMPLANT
BLADE SURG 11 STRL SS (BLADE) ×1 IMPLANT
BNDG GAUZE ELAST 4 BULKY (GAUZE/BANDAGES/DRESSINGS) ×3 IMPLANT
CANISTER SUCT 3000ML PPV (MISCELLANEOUS) ×3 IMPLANT
CANNULA EZ GLIDE AORTIC 21FR (CANNULA) ×5 IMPLANT
CATH CPB KIT OWEN (MISCELLANEOUS) ×3 IMPLANT
CATH THORACIC 36FR (CATHETERS) ×3 IMPLANT
CLIP RETRACTION 3.0MM CORONARY (MISCELLANEOUS) ×1 IMPLANT
CLIP VESOCCLUDE MED 24/CT (CLIP) IMPLANT
CLIP VESOCCLUDE SM WIDE 24/CT (CLIP) IMPLANT
CRADLE DONUT ADULT HEAD (MISCELLANEOUS) ×3 IMPLANT
DERMABOND ADVANCED (GAUZE/BANDAGES/DRESSINGS) ×1
DERMABOND ADVANCED .7 DNX12 (GAUZE/BANDAGES/DRESSINGS) IMPLANT
DRAIN CHANNEL 32F RND 10.7 FF (WOUND CARE) ×6 IMPLANT
DRAPE CARDIOVASCULAR INCISE (DRAPES) ×3
DRAPE INCISE IOBAN 66X45 STRL (DRAPES) ×2 IMPLANT
DRAPE SLUSH/WARMER DISC (DRAPES) ×3 IMPLANT
DRAPE SRG 135X102X78XABS (DRAPES) ×2 IMPLANT
DRSG AQUACEL AG ADV 3.5X14 (GAUZE/BANDAGES/DRESSINGS) ×1 IMPLANT
DRSG COVADERM 4X14 (GAUZE/BANDAGES/DRESSINGS) ×2 IMPLANT
ELECT BLADE 4.0 EZ CLEAN MEGAD (MISCELLANEOUS) ×3
ELECT REM PT RETURN 9FT ADLT (ELECTROSURGICAL) ×6
ELECTRODE BLDE 4.0 EZ CLN MEGD (MISCELLANEOUS) ×2 IMPLANT
ELECTRODE REM PT RTRN 9FT ADLT (ELECTROSURGICAL) ×4 IMPLANT
FELT TEFLON 1X6 (MISCELLANEOUS) ×5 IMPLANT
GAUZE SPONGE 4X4 12PLY STRL (GAUZE/BANDAGES/DRESSINGS) ×6 IMPLANT
GLOVE BIOGEL PI IND STRL 6.5 (GLOVE) IMPLANT
GLOVE BIOGEL PI IND STRL 8.5 (GLOVE) IMPLANT
GLOVE BIOGEL PI INDICATOR 6.5 (GLOVE) ×4
GLOVE BIOGEL PI INDICATOR 8.5 (GLOVE) ×2
GLOVE ORTHO TXT STRL SZ7.5 (GLOVE) ×7 IMPLANT
GLOVE SURG ORTHO 8.5 STRL (GLOVE) ×3 IMPLANT
GOWN STRL REUS W/ TWL LRG LVL3 (GOWN DISPOSABLE) ×8 IMPLANT
GOWN STRL REUS W/TWL LRG LVL3 (GOWN DISPOSABLE) ×24
HEMOSTAT POWDER SURGIFOAM 1G (HEMOSTASIS) ×9 IMPLANT
INSERT FOGARTY XLG (MISCELLANEOUS) ×3 IMPLANT
KIT BASIN OR (CUSTOM PROCEDURE TRAY) ×3 IMPLANT
KIT ROOM TURNOVER OR (KITS) ×3 IMPLANT
KIT SUCTION CATH 14FR (SUCTIONS) ×9 IMPLANT
KIT VASOVIEW HEMOPRO VH 3000 (KITS) ×3 IMPLANT
LEAD PACING MYOCARDI (MISCELLANEOUS) ×3 IMPLANT
MARKER GRAFT CORONARY BYPASS (MISCELLANEOUS) ×9 IMPLANT
NS IRRIG 1000ML POUR BTL (IV SOLUTION) ×15 IMPLANT
PACK E OPEN HEART (SUTURE) ×3 IMPLANT
PACK OPEN HEART (CUSTOM PROCEDURE TRAY) ×3 IMPLANT
PAD ARMBOARD 7.5X6 YLW CONV (MISCELLANEOUS) ×6 IMPLANT
PAD ELECT DEFIB RADIOL ZOLL (MISCELLANEOUS) ×3 IMPLANT
PENCIL BUTTON HOLSTER BLD 10FT (ELECTRODE) ×4 IMPLANT
PUNCH AORTIC ROT 4.0MM RCL 40 (MISCELLANEOUS) ×1 IMPLANT
PUNCH AORTIC ROTATE 4.0MM (MISCELLANEOUS) IMPLANT
PUNCH AORTIC ROTATE 4.5MM 8IN (MISCELLANEOUS) IMPLANT
PUNCH AORTIC ROTATE 5MM 8IN (MISCELLANEOUS) IMPLANT
SENSOR MYOCARDIAL TEMP (MISCELLANEOUS) ×1 IMPLANT
SET CARDIOPLEGIA MPS 5001102 (MISCELLANEOUS) ×1 IMPLANT
SOLUTION ANTI FOG 6CC (MISCELLANEOUS) ×1 IMPLANT
SPONGE LAP 18X18 X RAY DECT (DISPOSABLE) ×1 IMPLANT
SPONGE LAP 4X18 X RAY DECT (DISPOSABLE) IMPLANT
SUT BONE WAX W31G (SUTURE) ×3 IMPLANT
SUT ETHIBOND X763 2 0 SH 1 (SUTURE) ×6 IMPLANT
SUT MNCRL AB 3-0 PS2 18 (SUTURE) ×6 IMPLANT
SUT MNCRL AB 4-0 PS2 18 (SUTURE) ×1 IMPLANT
SUT PDS AB 1 CTX 36 (SUTURE) ×6 IMPLANT
SUT PROLENE 2 0 SH DA (SUTURE) IMPLANT
SUT PROLENE 3 0 SH DA (SUTURE) ×3 IMPLANT
SUT PROLENE 3 0 SH1 36 (SUTURE) IMPLANT
SUT PROLENE 4 0 RB 1 (SUTURE) ×6
SUT PROLENE 4 0 SH DA (SUTURE) IMPLANT
SUT PROLENE 4-0 RB1 .5 CRCL 36 (SUTURE) IMPLANT
SUT PROLENE 5 0 C 1 36 (SUTURE) IMPLANT
SUT PROLENE 6 0 C 1 30 (SUTURE) ×2 IMPLANT
SUT PROLENE 7.0 RB 3 (SUTURE) ×9 IMPLANT
SUT PROLENE 8 0 BV175 6 (SUTURE) IMPLANT
SUT PROLENE BLUE 7 0 (SUTURE) ×4 IMPLANT
SUT PROLENE POLY MONO (SUTURE) IMPLANT
SUT SILK  1 MH (SUTURE) ×1
SUT SILK 1 MH (SUTURE) ×2 IMPLANT
SUT SILK 2 0 SH CR/8 (SUTURE) ×1 IMPLANT
SUT STEEL 6MS V (SUTURE) IMPLANT
SUT STEEL STERNAL CCS#1 18IN (SUTURE) IMPLANT
SUT STEEL SZ 6 DBL 3X14 BALL (SUTURE) IMPLANT
SUT VIC AB 1 CTX 36 (SUTURE)
SUT VIC AB 1 CTX36XBRD ANBCTR (SUTURE) IMPLANT
SUT VIC AB 2-0 CT1 27 (SUTURE) ×3
SUT VIC AB 2-0 CT1 TAPERPNT 27 (SUTURE) IMPLANT
SUT VIC AB 2-0 CTX 27 (SUTURE) IMPLANT
SUT VIC AB 3-0 SH 27 (SUTURE)
SUT VIC AB 3-0 SH 27X BRD (SUTURE) IMPLANT
SUT VIC AB 3-0 X1 27 (SUTURE) IMPLANT
SUT VICRYL 4-0 PS2 18IN ABS (SUTURE) IMPLANT
SYSTEM SAHARA CHEST DRAIN ATS (WOUND CARE) ×3 IMPLANT
TOWEL GREEN STERILE (TOWEL DISPOSABLE) ×3 IMPLANT
TOWEL GREEN STERILE FF (TOWEL DISPOSABLE) ×3 IMPLANT
TRAY FOLEY SILVER 16FR TEMP (SET/KITS/TRAYS/PACK) ×3 IMPLANT
TUBING INSUFFLATION (TUBING) ×3 IMPLANT
UNDERPAD 30X30 (UNDERPADS AND DIAPERS) ×3 IMPLANT
WATER STERILE IRR 1000ML POUR (IV SOLUTION) ×6 IMPLANT

## 2017-11-25 NOTE — Progress Notes (Signed)
Rapid weaning protocol 

## 2017-11-25 NOTE — Procedures (Signed)
Extubation Procedure Note  Patient Details:   Name: Cathy Smith DOB: 07/23/1966 MRN: [id number]   Airway Documentation:  Airway 7.5 mm (Active)  Secured at (cm) 22 cm 11/25/2017 12:30 PM  Measured From Lips 11/25/2017 12:30 PM  Secured Location Right 11/25/2017 12:30 PM  Secured By Caron PresumePink Tape 11/25/2017 12:30 PM  Site Condition Dry 11/25/2017 12:30 PM    Evaluation  O2 sats: stable throughout Complications: No apparent complications Patient did tolerate procedure well. Bilateral Breath Sounds: Clear   Yes   Patient extubated per MD to 4L [place] with no apparent complications. Positive cuff leak was noted prior to extubation. Patient achieved NIF of -20 and VC of .5 L. Patient is able to speak and is alert and oriented to time and place. Vitals are stable. RT will continue to monitor.   Jerianne Anselmo Lajuana RippleM Thiago Ragsdale 11/25/2017, 5:51 PM

## 2017-11-25 NOTE — Plan of Care (Signed)
  Health Behavior/Discharge Planning: Ability to manage health-related needs will improve 11/25/2017 0131 - Progressing by Elnita Maxwellodoo, Yovanni Frenette A, RN   Clinical Measurements: Will remain free from infection 11/25/2017 0131 - Progressing by Elnita Maxwellodoo, Kalianne Fetting A, RN   Activity: Risk for activity intolerance will decrease 11/25/2017 0131 - Progressing by Elnita Maxwellodoo, Sadrac Zeoli A, RN   Coping: Level of anxiety will decrease 11/25/2017 0131 - Progressing by Elnita Maxwellodoo, Dannilynn Gallina A, RN

## 2017-11-25 NOTE — Brief Op Note (Signed)
11/22/2017 - 11/25/2017  12:12 PM  PATIENT:  Cathy Smith  52 y.o. female  PRE-OPERATIVE DIAGNOSIS:  CAD  POST-OPERATIVE DIAGNOSIS:  CAD  PROCEDURE:  Procedure(s):  CORONARY ARTERY BYPASS GRAFTING x 3 -Left Internal Mammary Artery to Left Anterior Descending -Saphenous Vein Graft to Obtuse Marginal -Saphenous Vein Graft to Posterior Lateral Descending  ENDOSCOPIC HARVEST GREATER SAPHENOUS VEIN -Left Leg -Right Leg opened, saphenous vein small conduit, not harvested  TRANSESOPHAGEAL ECHOCARDIOGRAM (TEE) (N/A)  SURGEON:  Surgeon(s) and Role:    Purcell Nails* Neetu Carrozza H, MD - Primary  PHYSICIAN ASSISTANT: Lowella DandyErin Barrett PA-C  ANESTHESIA:   general  EBL:  500 mL   BLOOD ADMINISTERED: CELLSAVER  DRAINS: Mediastinal Chest Drains, Left Pleural Chest Tube   LOCAL MEDICATIONS USED:  NONE  SPECIMEN:  No Specimen  DISPOSITION OF SPECIMEN:  N/A  COUNTS:  YES  TOURNIQUET:  * No tourniquets in log *  DICTATION: .Dragon Dictation  PLAN OF CARE: Admit to inpatient   PATIENT DISPOSITION:  ICU - intubated and hemodynamically stable.   Delay start of Pharmacological VTE agent (>24hrs) due to surgical blood loss or risk of bleeding: yes

## 2017-11-25 NOTE — Op Note (Signed)
CARDIOTHORACIC SURGERY OPERATIVE NOTE  Date of Procedure: 11/25/2017  Preoperative Diagnosis:   Severe 3-vessel Coronary Artery Disease  Exertional Angina Pectoris  Postoperative Diagnosis: Same  Procedure:    Coronary Artery Bypass Grafting x 3   Left Internal Mammary Artery to Distal Left Anterior Descending Coronary Artery  Saphenous Vein Graft to Right Posterolateral Branch Coronary Artery  Saphenous Vein Graft to Obtuse Marginal Branch of Left Circumflex Coronary Artery  Endoscopic Vein Harvest from Left Thigh and Lower Leg  Surgeon: Salvatore Decent. Cornelius Moras, MD  Assistant: Lowella Dandy, PA-C  Anesthesia: Germaine Pomfret, MD  Operative Findings:  Normal left ventricular systolic function  Small caliber but good quality left internal mammary artery conduit  Small caliber but good quality saphenous vein conduit  Good quality target vessels for grafting with exception of terminal branches of right coronary artery which were small and diffusely diseased    BRIEF CLINICAL NOTE AND INDICATIONS FOR SURGERY  Patient is a 52 year old moderately obese female who is HIV positive with no previous history of coronary artery disease and risk factors notable only for history of hypertension and hyperlipidemia who has been referred for surgical consultation to discuss treatment options for management of 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  Patient states that she was in her usual state of health until a proximally 3 months ago when she began to experience substernal chest discomfort with exertion.  She describes the chest discomfort as dull pressure-like pain across the chest sometimes associated with shortness of breath.  Pain is usually exacerbated by walking or doing some sort of physical activity around the house and always relieved within a few minutes of rest.  The patient denies any history of resting chest pain or nocturnal chest pain.  She denies  any PND, orthopnea, or lower extremity edema.  She was referred to Dr. Allyson Sabal and underwent nuclear stress test that was abnormal and felt to be high risk.  She underwent diagnostic cardiac catheterization earlier today demonstrating left main and three-vessel coronary artery disease with preserved left ventricular systolic function.  Cardiothoracic surgical consultation was requested.  The patient has been seen in consultation and counseled at length regarding the indications, risks and potential benefits of surgery.  All questions have been answered, and the patient provides full informed consent for the operation as described.    DETAILS OF THE OPERATIVE PROCEDURE  Preparation:  The patient is brought to the operating room on the above mentioned date and central monitoring was established by the anesthesia team including placement of Swan-Ganz catheter and radial arterial line. The patient is placed in the supine position on the operating table.  Intravenous antibiotics are administered. General endotracheal anesthesia is induced uneventfully. A Foley catheter is placed.  Baseline transesophageal echocardiogram was performed.  Findings were notable for normal LV systolic function.  The patient's chest, abdomen, both groins, and both lower extremities are prepared and draped in a sterile manner. A time out procedure is performed.   Surgical Approach and Conduit Harvest:  A median sternotomy incision was performed and the left internal mammary artery is dissected from the chest wall and prepared for bypass grafting. The left internal mammary artery is notably small in caliber but otherwise good quality conduit. Simultaneously, the greater saphenous vein is obtained from the patient's left thigh and leg using endoscopic vein harvest technique. The saphenous vein is notably small in caliber but otherwise good quality conduit. After removal of the saphenous vein, the small surgical incisions  in the  lower extremity are closed with absorbable suture. Following systemic heparinization, the left internal mammary artery was transected distally noted to have excellent flow.   Extracorporeal Cardiopulmonary Bypass and Myocardial Protection:  The pericardium is opened. The ascending aorta is small in caliber but normal in appearance. The ascending aorta and the right atrium are cannulated for cardiopulmonary bypass.  Adequate heparinization is verified.   The entire pre-bypass portion of the operation was notable for stable hemodynamics.  Cardiopulmonary bypass was begun and the surface of the heart is inspected. Distal target vessels are selected for coronary artery bypass grafting. A cardioplegia cannula is placed in the ascending aorta.  A temperature probe was placed in the interventricular septum.  The patient is allowed to cool passively to [facility]32C systemic temperature.  The aortic cross clamp is applied and cold blood cardioplegia is delivered initially in an antegrade fashion through the aortic root. Iced saline slush is applied for topical hypothermia.  The initial cardioplegic arrest is rapid with early diastolic arrest.  Repeat doses of cardioplegia are administered intermittently throughout the entire cross clamp portion of the operation through the aortic root and through subsequently placed vein grafts in order to maintain completely flat electrocardiogram and septal myocardial temperature below 15C.  Myocardial protection was felt to be excellent.  Coronary Artery Bypass Grafting:   The posterior descending branch of the right coronary artery was too small and diffusely diseased for grafting.  It was not grafted.  The posterolateral branch of the distal right coronary artery was grafted using a reversed saphenous vein graft in an end-to-side fashion.  At the site of distal anastomosis the target vessel was poor quality and measured approximately 1.2 mm in diameter.  The obtuse marginal  branch of the left circumflex coronary artery was grafted using a reversed saphenous vein graft in an end-to-side fashion.  At the site of distal anastomosis the target vessel was good quality and measured approximately 1.7 mm in diameter.  The distal left anterior coronary artery was grafted with the left internal mammary artery in an end-to-side fashion.  At the site of distal anastomosis the target vessel was good quality and measured approximately 1.5 mm in diameter.  All proximal vein graft anastomoses were placed directly to the ascending aorta prior to removal of the aortic cross clamp.  The septal myocardial temperature rose rapidly after reperfusion of the left internal mammary artery graft.  The aortic cross clamp was removed after a total cross clamp time of 57 minutes.   Procedure Completion:  All proximal and distal coronary anastomoses were inspected for hemostasis and appropriate graft orientation. Epicardial pacing wires are fixed to the right ventricular outflow tract and to the right atrial appendage. The patient is rewarmed to 37C temperature. The patient is weaned and disconnected from cardiopulmonary bypass.  The patient's rhythm at separation from bypass was sinus.  The patient was weaned from cardiopulmonary bypass without any inotropic support. Total cardiopulmonary bypass time for the operation was 70 minutes.  Followup transesophageal echocardiogram performed after separation from bypass revealed no changes from the preoperative exam.  The aortic and venous cannula were removed uneventfully. Protamine was administered to reverse the anticoagulation. The mediastinum and pleural space were inspected for hemostasis and irrigated with saline solution. The mediastinum and the left pleural space were drained using 3 chest tubes placed through separate stab incisions inferiorly.  The soft tissues anterior to the aorta were reapproximated loosely. The sternum is closed with double  strength sternal  wire. The soft tissues anterior to the sternum were closed in multiple layers and the skin is closed with a running subcuticular skin closure.  The post-bypass portion of the operation was notable for stable rhythm and hemodynamics.  No blood products were administered during the operation.   Disposition:  The patient tolerated the procedure well and is transported to the surgical intensive care in stable condition. There are no intraoperative complications. All sponge instrument and needle counts are verified correct at completion of the operation.    Salvatore Decent. Cornelius Moras MD 11/25/2017 12:13 PM

## 2017-11-25 NOTE — Progress Notes (Signed)
Patient transferred to OR holding area. Report called to CRNA Aurther Lofterry. CCMD notified.

## 2017-11-25 NOTE — Anesthesia Procedure Notes (Signed)
Procedure Name: Intubation Date/Time: 11/25/2017 8:07 AM Performed by: Montarius Kitagawa T, CRNA Pre-anesthesia Checklist: Patient identified, Emergency Drugs available, Suction available and Patient being monitored Patient Re-evaluated:Patient Re-evaluated prior to induction Oxygen Delivery Method: Circle system utilized Preoxygenation: Pre-oxygenation with 100% oxygen Induction Type: IV induction Ventilation: Mask ventilation without difficulty Laryngoscope Size: Glidescope and 3 Grade View: Grade I Tube type: Subglottic suction tube Tube size: 7.5 mm Number of attempts: 1 Airway Equipment and Method: Patient positioned with wedge pillow,  Stylet and Video-laryngoscopy Placement Confirmation: ETT inserted through vocal cords under direct vision,  positive ETCO2 and breath sounds checked- equal and bilateral Secured at: 22 cm Tube secured with: Tape Dental Injury: Teeth and Oropharynx as per pre-operative assessment

## 2017-11-25 NOTE — Anesthesia Procedure Notes (Signed)
Central Venous Catheter Insertion Performed by: Marcene DuosFitzgerald, Adley Castello, MD, anesthesiologist Start/End1/17/2019 6:40 AM, 11/25/2017 6:50 AM Patient location: Pre-op. Preanesthetic checklist: patient identified, IV checked, site marked, risks and benefits discussed, surgical consent, monitors and equipment checked, pre-op evaluation, timeout performed and anesthesia consent Hand hygiene performed  and maximum sterile barriers used  PA cath was placed.Swan type:thermodilution PA Cath depth:40 Procedure performed without using ultrasound guided technique. Attempts: 1 Patient tolerated the procedure well with no immediate complications.

## 2017-11-25 NOTE — Transfer of Care (Signed)
Immediate Anesthesia Transfer of Care Note  Patient: Cathy Smith  Procedure(s) Performed: CORONARY ARTERY BYPASS GRAFTING (CABG), ON PUMP, TIMES THREE, USING LEFT INTERNAL MAMMARY ARTERY AND ENDOSCOPICALLY HARVESTED LEFT GREATER SAPHENOUS VEIN (N/A Chest) TRANSESOPHAGEAL ECHOCARDIOGRAM (TEE) (N/A )  Patient Location: ICU  Anesthesia Type:General  Level of Consciousness: Patient remains intubated per anesthesia plan  Airway & Oxygen Therapy: Patient remains intubated per anesthesia plan and Patient placed on Ventilator (see vital sign flow sheet for setting)  Post-op Assessment: Report given to RN and Post -op Vital signs reviewed and stable  Post vital signs: Reviewed and stable  Last Vitals:  Vitals:   11/25/17 0546 11/25/17 1230  BP: (!) 131/98 107/67  Pulse: 88 90  Resp: 18 (!) 24  Temp: 36.8 C   SpO2: 99% 100%    Last Pain:  Vitals:   11/25/17 0546  TempSrc: Oral  PainSc:       Patients Stated Pain Goal: 0 (11/22/17 2000)  Complications: No apparent anesthesia complications

## 2017-11-25 NOTE — Progress Notes (Signed)
Patient ID: Cathy DillingRenee C Smith, female   DOB: 02/12/1966, 52 y.o.   MRN: [id number]  TCTS Evening Rounds:   Hemodynamically stable on neo 20 mcg. CI = 2 Extubated, sats 100%  Urine output good  CT output low  CBC    Component Value Date/Time   WBC 3.6 (L) 11/25/2017 1238   RBC 2.42 (L) 11/25/2017 1238   HGB 7.7 (L) 11/25/2017 1238   HGB 13.2 11/19/2017 1216   HCT 23.0 (L) 11/25/2017 1238   HCT 39.1 11/19/2017 1216   PLT 95 (L) 11/25/2017 1238   PLT 227 11/19/2017 1216   MCV 95.0 11/25/2017 1238   MCV 94 11/19/2017 1216   MCH 31.8 11/25/2017 1238   MCHC 33.5 11/25/2017 1238   RDW 12.2 11/25/2017 1238   RDW 13.0 11/19/2017 1216   LYMPHSABS 1.4 11/19/2017 1216   MONOABS 408 04/13/2017 1122   EOSABS 0.0 11/19/2017 1216   BASOSABS 0.0 11/19/2017 1216     BMET    Component Value Date/Time   NA 137 11/23/2017 0613   NA 142 11/19/2017 1216   K 4.2 11/23/2017 0613   CL 103 11/23/2017 0613   CO2 22 11/23/2017 0613   GLUCOSE 118 (H) 11/23/2017 0613   BUN 8 11/23/2017 0613   BUN 11 11/19/2017 1216   CREATININE 0.86 11/23/2017 0613   CREATININE 0.96 04/13/2017 1122   CALCIUM 9.6 11/23/2017 0613   GFRNONAA >60 11/23/2017 0613   GFRNONAA 68 04/13/2017 1122   GFRAA >60 11/23/2017 0613   GFRAA 78 04/13/2017 1122     A/P:  Stable postop course. Continue current plans

## 2017-11-25 NOTE — Progress Notes (Signed)
  Echocardiogram Echocardiogram Transesophageal has been performed.  Rodric Punch M 11/25/2017, 9:06 AM 

## 2017-11-25 NOTE — Progress Notes (Signed)
      [address]       [place] [id number]             [phone]     CARDIOTHORACIC SURGERY PROGRESS NOTE  Subjective: Cathy Smith has been scheduled for Procedure(s): CORONARY ARTERY BYPASS GRAFTING (CABG) (N/A) TRANSESOPHAGEAL ECHOCARDIOGRAM (TEE) (N/A) today.   Objective: Vital signs in last 24 hours: Temp:  [98 F (36.7 C)-98.5 F (36.9 C)] 98.3 F (36.8 C) (01/17 0546) Pulse Rate:  [88-94] 88 (01/17 0546) Cardiac Rhythm: Normal sinus rhythm (01/16 1900) Resp:  [18] 18 (01/17 0546) BP: (101-131)/(75-98) 131/98 (01/17 0546) SpO2:  [95 %-99 %] 99 % (01/17 0546) Weight:  [145 lb 6.4 oz (66 kg)] 145 lb 6.4 oz (66 kg) (01/17 0546)  Physical Exam: Unchanged from previously   Intake/Output from previous day: 01/16 0701 - 01/17 0700 In: 415 [P.O.:415] Out: -  Intake/Output this shift: No intake/output data recorded.  Lab Results: Recent Labs    11/23/17 0613  WBC 6.2  HGB 12.8  HCT 37.7  PLT 192   BMET:  Recent Labs    11/23/17 0613  NA 137  K 4.2  CL 103  CO2 22  GLUCOSE 118*  BUN 8  CREATININE 0.86  CALCIUM 9.6    CBG (last 3)  Recent Labs    11/25/17 0518  GLUCAP 112*   PT/INR:   Recent Labs    11/23/17 0613  LABPROT 13.3  INR 1.02    Assessment/Plan:   The various methods of treatment have been discussed with the patient. After consideration of the risks, benefits and treatment options the patient has consented to the planned procedure.   The patient has been seen and labs reviewed. There are no changes in the patient's condition to prevent proceeding with the planned procedure today.   [name], MD 11/25/2017 6:19 AM

## 2017-11-25 NOTE — Anesthesia Procedure Notes (Signed)
Arterial Line Insertion Start/End1/17/2019 7:05 AM, 11/25/2017 7:15 AM Performed by: Waldemar Siegel T, Scientist, clinical (histocompatibility and immunogenetics)CRNA, CRNA  Patient location: Pre-op. Preanesthetic checklist: patient identified, IV checked, site marked, risks and benefits discussed, surgical consent, monitors and equipment checked and pre-op evaluation Lidocaine 1% used for infiltration and patient sedated Left, radial was placed Catheter size: 20 G Hand hygiene performed  and maximum sterile barriers used   Attempts: 2 Procedure performed without using ultrasound guided technique. Following insertion, dressing applied and Biopatch. Post procedure assessment: normal  Patient tolerated the procedure well with no immediate complications.

## 2017-11-25 NOTE — Progress Notes (Signed)
Per rapid weaning protocol 

## 2017-11-25 NOTE — Anesthesia Procedure Notes (Signed)
Central Venous Catheter Insertion Performed by: Marcene DuosFitzgerald, Margarethe Virgen, MD, anesthesiologist Start/End1/17/2019 6:40 AM, 11/25/2017 6:50 AM Patient location: Pre-op. Preanesthetic checklist: patient identified, IV checked, site marked, risks and benefits discussed, surgical consent, monitors and equipment checked, pre-op evaluation, timeout performed and anesthesia consent Position: Trendelenburg Lidocaine 1% used for infiltration and patient sedated Hand hygiene performed , maximum sterile barriers used  and Seldinger technique used Catheter size: 8.5 Fr Total catheter length 10. Central line and PA cath was placed.Sheath introducer Swan type:thermodilution PA Cath depth:40 Procedure performed using ultrasound guided technique. Ultrasound Notes:anatomy identified, needle tip was noted to be adjacent to the nerve/plexus identified, no ultrasound evidence of intravascular and/or intraneural injection and image(s) printed for medical record Attempts: 1 Following insertion, line sutured, dressing applied and Biopatch. Post procedure assessment: blood return through all ports, free fluid flow and no air  Patient tolerated the procedure well with no immediate complications.

## 2017-11-26 ENCOUNTER — Inpatient Hospital Stay (HOSPITAL_COMMUNITY): Payer: Medicare HMO

## 2017-11-26 ENCOUNTER — Encounter (HOSPITAL_COMMUNITY): Payer: Self-pay | Admitting: Thoracic Surgery (Cardiothoracic Vascular Surgery)

## 2017-11-26 LAB — CBC
HCT: 30.2 % — ABNORMAL LOW (ref 36.0–46.0)
HEMATOCRIT: 30.7 % — AB (ref 36.0–46.0)
HEMOGLOBIN: 10.1 g/dL — AB (ref 12.0–15.0)
Hemoglobin: 10.1 g/dL — ABNORMAL LOW (ref 12.0–15.0)
MCH: 29.2 pg (ref 26.0–34.0)
MCH: 29.2 pg (ref 26.0–34.0)
MCHC: 33.4 g/dL (ref 30.0–36.0)
MCHC: 32.9 g/dL (ref 30.0–36.0)
MCV: 87.3 fL (ref 78.0–100.0)
MCV: 88.7 fL (ref 78.0–100.0)
PLATELETS: 101 10*3/uL — AB (ref 150–400)
PLATELETS: 102 10*3/uL — AB (ref 150–400)
RBC: 3.46 MIL/uL — ABNORMAL LOW (ref 3.87–5.11)
RBC: 3.46 MIL/uL — AB (ref 3.87–5.11)
RDW: 20.8 % — ABNORMAL HIGH (ref 11.5–15.5)
RDW: 21.2 % — ABNORMAL HIGH (ref 11.5–15.5)
WBC: 9.7 10*3/uL (ref 4.0–10.5)
WBC: 11.6 10*3/uL — AB (ref 4.0–10.5)

## 2017-11-26 LAB — BASIC METABOLIC PANEL
Anion gap: 7 (ref 5–15)
BUN: 7 mg/dL (ref 6–20)
CALCIUM: 7.7 mg/dL — AB (ref 8.9–10.3)
CO2: 22 mmol/L (ref 22–32)
CREATININE: 0.87 mg/dL (ref 0.44–1.00)
Chloride: 110 mmol/L (ref 101–111)
Glucose, Bld: 140 mg/dL — ABNORMAL HIGH (ref 65–99)
Potassium: 4.6 mmol/L (ref 3.5–5.1)
SODIUM: 139 mmol/L (ref 135–145)

## 2017-11-26 LAB — TYPE AND SCREEN
ABO/RH(D): B POS
ANTIBODY SCREEN: NEGATIVE
Unit division: 0
Unit division: 0

## 2017-11-26 LAB — MAGNESIUM
MAGNESIUM: 2.3 mg/dL (ref 1.7–2.4)
MAGNESIUM: 2.1 mg/dL (ref 1.7–2.4)

## 2017-11-26 LAB — GLUCOSE, CAPILLARY
GLUCOSE-CAPILLARY: 141 mg/dL — AB (ref 65–99)
GLUCOSE-CAPILLARY: 164 mg/dL — AB (ref 65–99)
GLUCOSE-CAPILLARY: 180 mg/dL — AB (ref 65–99)
Glucose-Capillary: 129 mg/dL — ABNORMAL HIGH (ref 65–99)
Glucose-Capillary: 130 mg/dL — ABNORMAL HIGH (ref 65–99)
Glucose-Capillary: 175 mg/dL — ABNORMAL HIGH (ref 65–99)
Glucose-Capillary: 198 mg/dL — ABNORMAL HIGH (ref 65–99)

## 2017-11-26 LAB — POCT I-STAT, CHEM 8
BUN: 8 mg/dL (ref 6–20)
Calcium, Ion: 1.15 mmol/L (ref 1.15–1.40)
Chloride: 100 mmol/L — ABNORMAL LOW (ref 101–111)
Creatinine, Ser: 0.7 mg/dL (ref 0.44–1.00)
GLUCOSE: 193 mg/dL — AB (ref 65–99)
HCT: 30 % — ABNORMAL LOW (ref 36.0–46.0)
HEMOGLOBIN: 10.2 g/dL — AB (ref 12.0–15.0)
POTASSIUM: 4.1 mmol/L (ref 3.5–5.1)
Sodium: 138 mmol/L (ref 135–145)
TCO2: 24 mmol/L (ref 22–32)

## 2017-11-26 LAB — BPAM RBC
Blood Product Expiration Date: 201902032359
Blood Product Expiration Date: 201902032359
ISSUE DATE / TIME: 201901171516
ISSUE DATE / TIME: 201901171420
UNIT TYPE AND RH: 7300
Unit Type and Rh: 7300

## 2017-11-26 LAB — CREATININE, SERUM: Creatinine, Ser: 0.96 mg/dL (ref 0.44–1.00)

## 2017-11-26 MED ORDER — ENOXAPARIN SODIUM 40 MG/0.4ML ~~LOC~~ SOLN
40.0000 mg | Freq: Every day | SUBCUTANEOUS | Status: DC
  Administered 2017-11-27 – 2017-11-29 (×3): 40 mg via SUBCUTANEOUS
  Filled 2017-11-26 (×3): qty 0.4

## 2017-11-26 MED ORDER — LEVALBUTEROL HCL 0.63 MG/3ML IN NEBU
0.6300 mg | INHALATION_SOLUTION | Freq: Three times a day (TID) | RESPIRATORY_TRACT | Status: DC
  Administered 2017-11-26 – 2017-11-27 (×6): 0.63 mg via RESPIRATORY_TRACT
  Filled 2017-11-26 (×7): qty 3

## 2017-11-26 MED ORDER — ENOXAPARIN SODIUM 40 MG/0.4ML ~~LOC~~ SOLN: 40.0000 mg | Freq: Every day | SUBCUTANEOUS | Status: DC

## 2017-11-26 MED ORDER — METOCLOPRAMIDE HCL 5 MG/ML IJ SOLN
10.0000 mg | Freq: Four times a day (QID) | INTRAMUSCULAR | Status: AC
  Administered 2017-11-26 – 2017-11-27 (×4): 10 mg via INTRAVENOUS
  Filled 2017-11-26 (×4): qty 2

## 2017-11-26 MED ORDER — ALBUMIN HUMAN 5 % IV SOLN
12.5000 g | Freq: Once | INTRAVENOUS | Status: AC
  Administered 2017-11-26: 12.5 g via INTRAVENOUS
  Filled 2017-11-26: qty 250

## 2017-11-26 MED ORDER — INSULIN ASPART 100 UNIT/ML ~~LOC~~ SOLN
0.0000 [IU] | SUBCUTANEOUS | Status: DC
  Administered 2017-11-26 (×2): 4 [IU] via SUBCUTANEOUS
  Administered 2017-11-27: 2 [IU] via SUBCUTANEOUS
  Administered 2017-11-27: 4 [IU] via SUBCUTANEOUS

## 2017-11-26 MED FILL — Sodium Bicarbonate IV Soln 8.4%: INTRAVENOUS | Qty: 50 | Status: AC

## 2017-11-26 MED FILL — Heparin Sodium (Porcine) Inj 1000 Unit/ML: INTRAMUSCULAR | Qty: 10 | Status: AC

## 2017-11-26 MED FILL — Sodium Chloride IV Soln 0.9%: INTRAVENOUS | Qty: 2000 | Status: AC

## 2017-11-26 MED FILL — Heparin Sodium (Porcine) Inj 1000 Unit/ML: INTRAMUSCULAR | Qty: 30 | Status: AC

## 2017-11-26 MED FILL — Lidocaine HCl IV Inj 20 MG/ML: INTRAVENOUS | Qty: 5 | Status: AC

## 2017-11-26 MED FILL — Magnesium Sulfate Inj 50%: INTRAMUSCULAR | Qty: 10 | Status: AC

## 2017-11-26 MED FILL — Mannitol IV Soln 20%: INTRAVENOUS | Qty: 500 | Status: AC

## 2017-11-26 MED FILL — Potassium Chloride Inj 2 mEq/ML: INTRAVENOUS | Qty: 40 | Status: AC

## 2017-11-26 MED FILL — Electrolyte-R (PH 7.4) Solution: INTRAVENOUS | Qty: 4000 | Status: AC

## 2017-11-26 MED FILL — Dexmedetomidine HCl in NaCl 0.9% IV Soln 400 MCG/100ML: INTRAVENOUS | Qty: 100 | Status: AC

## 2017-11-26 NOTE — Discharge Summary (Addendum)
Physician Discharge Summary  Patient ID: Cathy Smith MRN: [id number] DOB/AGE: 54/07/1966 54 y.o.  Admit date: 11/22/2017 Discharge date: 11/30/2017  Admission Diagnoses:  Patient Active Problem List   Diagnosis Date Noted  . CAD, multiple vessel 11/23/2017  . CAD (coronary artery disease) 11/22/2017  . Abnormal stress test   . Chest pain 11/12/2017  . LGSIL Pap smear of vagina 04/28/2017  . 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 Sinusitis 12/30/2015  . History of fusion of cervical spine 08/27/2015  . 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 Physical exam, annual 09/06/2014  . Need for Tdap vaccination 09/06/2014  . Alopecia of scalp 09/06/2014  . Urinary tract infection symptoms 08/15/2014  . Hypertriglyceridemia without hypercholesterolemia 08/08/2014  . Other and unspecified hyperlipidemia 01/23/2013  . Dysuria 07/13/2012  . Abnormal cervical Papanicolaou smear 10/22/2011  . Essential hypertension 06/12/2009  . MONILIASIS, ORAL 09/24/2008  . HX, PERSONAL, PAST NONCOMPLIANCE 11/24/2006  . APHTHOUS ULCERS 08/18/2006  . Human immunodeficiency virus (HIV) disease (HCC) 09/15/2000  . HYSTERECTOMY, HX OF 11/10/1983   Discharge Diagnoses:   Patient Active Problem List   Diagnosis Date Noted  . 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 CAD, multiple vessel 11/23/2017  . CAD (coronary artery disease) 11/22/2017  . Abnormal stress test   . Chest pain 11/12/2017  . LGSIL Pap smear of vagina 04/28/2017  . 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 Sinusitis 12/30/2015  . History of fusion of cervical spine 08/27/2015  . 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 Physical exam, annual 09/06/2014  . Need for Tdap vaccination 09/06/2014  . Alopecia of scalp 09/06/2014  . Urinary tract infection symptoms 08/15/2014  . Hypertriglyceridemia without hypercholesterolemia 08/08/2014  . Other and unspecified hyperlipidemia 01/23/2013  . Dysuria 07/13/2012  .  Abnormal cervical Papanicolaou smear 10/22/2011  . Essential hypertension 06/12/2009  . MONILIASIS, ORAL 09/24/2008  . HX, PERSONAL, PAST NONCOMPLIANCE 11/24/2006  . APHTHOUS ULCERS 08/18/2006  . Human immunodeficiency virus (HIV) disease (HCC) 09/15/2000  . HYSTERECTOMY, HX OF 11/10/1983   Discharged Condition: Stable and discharged to home.  History of Present Illness:  Mrs. Cathy Smith is a 52 yo AA moderately obese female who is HIV positive with no previous history of coronary artery disease and risk factors notable only for history of hypertension and hyperlipidemia. The patient stated that she was in her usual state of health until a proximally 3 months ago when she began to experience substernal chest discomfort with exertion.  She describes the chest discomfort as dull pressure-like pain across the chest sometimes associated with shortness of breath.  Pain is usually exacerbated by walking or doing some sort of physical activity around the house and always relieved within a few minutes of rest.  The patient denies any history of resting chest pain or nocturnal chest pain.  She denies any PND, orthopnea, or lower extremity edema.  She was referred to Dr. Allyson Smith and underwent nuclear stress test that was abnormal and felt to be high risk.  She underwent diagnostic cardiac catheterization on 11/22/2017, which demonstrated left main and three-vessel coronary artery disease with preserved left ventricular systolic function.  It was felt coronary bypass grafting would be indicated, she was admitted for further care, and TCTS consult was obtained.     Hospital Course:   She was chest pain free after cardiac catheterization.  She was evaluated by Dr. Cornelius Smith who was in agreement that coronary bypass grafting would be indicated.  The  risks and benefits of the procedure were explained to the patient and she was agreeable to proceed.  She was taken to the operating room on 11/25/2017.  She underwent CABG x 3  utilizing LIMA to LAD, SVG to OM, and SVG to PLVB.  She also underwent endoscopic harvest of greater saphenous vein from her left leg.  Her right leg was explored, but felt to be small for conduit.  She tolerated the procedure without difficulty and was taken to the SICU in stable condition.  She was extubated the evening of surgery.  During her stay in the ICU her blood pressure remained labile.  She required several doses of albumin and support with Neo-synephrine drip.  This was weaned down as hemodynamics allowed.  She had some congestion and difficulty clearing sputum.  She was started on xopenex nebulizer treatments for this.  She also had issues with nausea post operatively.  She was treated zofran but required addition of reglan to ultimately help with this.   She was stable for transfer from the ICU to 4E for further convalescence on 11/28/2017. She is ambulating on room air. She is tolerating a diet and has had a bowel movement. She was tachycardic on 01/21 so her Lopressor was increased. Her pre op HGA1C was 5.6. Accu checks and SS were stopped on 01/21. Epicardial pacing wires were removed on 11/29/2017. Chest tube sutures will be removed the day of discharge. Significant Diagnostic Studies: angiography:    Ost RPDA to RPDA lesion is 95% stenosed.  Ost LM lesion is 75% stenosed.  Mid LM to Dist LM lesion is 50% stenosed.  Prox LAD lesion is 100% stenosed.  Ost 1st Mrg lesion is 95% stenosed.  The left ventricular systolic function is normal.  LV end diastolic pressure is normal.  The left ventricular ejection fraction is 55-65% by visual estimate.  Treatments: surgery:    Coronary Artery Bypass Grafting x 3              Left Internal Mammary Artery to Distal Left Anterior Descending Coronary Artery             Saphenous Vein Graft to Right Posterolateral Branch Coronary Artery             Saphenous Vein Graft to Obtuse Marginal Branch of Left Circumflex Coronary Artery              Endoscopic Vein Harvest from Left Thigh and Lower Leg  Disposition: 01-Home or Self Care   Discharge Medications:  Discharge Instructions    Amb Referral to Cardiac Rehabilitation   Complete by:  As directed    Diagnosis:  CABG   CABG X ___:  3     Allergies as of 11/30/2017   No Known Allergies     Medication List    STOP taking these medications   ibuprofen 200 MG tablet Commonly known as:  ADVIL,MOTRIN   pravastatin 40 MG tablet Commonly known as:  PRAVACHOL   spironolactone 25 MG tablet Commonly known as:  ALDACTONE     TAKE these medications   acetaminophen 500 MG tablet Commonly known as:  TYLENOL Take 2 tablets (1,000 mg total) by mouth every 6 (six) hours as needed.   aspirin 325 MG EC tablet Take 1 tablet (325 mg total) by mouth daily.   atorvastatin 20 MG tablet Commonly known as:  LIPITOR Take 1 tablet (20 mg total) by mouth daily at 6 PM.   furosemide 40 MG tablet Commonly  known as:  LASIX Take 1 tablet (40 mg total) by mouth daily.   GENVOYA 150-150-200-10 MG Tabs tablet Generic drug:  elvitegravir-cobicistat-emtricitabine-tenofovir TAKE 1 TABLET BY MOUTH DAILY WITH BREAKFAST   Metoprolol Tartrate 37.5 MG Tabs Take 37.5 mg by mouth 2 (two) times daily.   multivitamin with minerals Tabs tablet Take 1 tablet by mouth daily.   oxyCODONE 5 MG immediate release tablet Commonly known as:  Oxy IR/ROXICODONE Take 1 tablet (5 mg total) by mouth every 4 (four) hours as needed for severe pain.   potassium chloride SA 20 MEQ tablet Commonly known as:  K-DUR,KLOR-CON Take 1 tablet (20 mEq total) by mouth daily.   PREZISTA 800 MG tablet Generic drug:  darunavir TAKE 1 TABLET(800 MG) BY MOUTH DAILY   Vitamin D (Ergocalciferol) 50000 units Caps capsule Commonly known as:  DRISDOL Take 1 capsule (50,000 Units total) by mouth every 7 (seven) days.     The patient has been discharged on:   1.Beta Blocker:  Yes [ x  ]                               No   [   ]                              If No, reason:  2.Ace Inhibitor/ARB: Yes [   ]                                     No  [  x  ]                                     If No, reason:  3.Statin:   Yes [ x  ]                  No  [   ]                  If No, reason:  4.Ecasa:  Yes  [ x  ]                  No   [   ]                  If No, reason:  Follow-up Information    [facility] [place] Follow up on 01/03/2018.   Specialty:  Cardiothoracic Surgery Why:  Appointment is at 1:00, please get CXR at 12:30 located at [facility] on first floor of our office building Contact information: [address] [place], [address] [place] [address] [phone]       Runell Gess, MD. Go on 12/07/2017.   Specialties:  Cardiology, Radiology Why:  Appointment time is at 1:30 pm Contact information: [address] [place] [address] [phone]           Signed: Lowella Dandy PA-C 11/30/2017, 5:55 PM

## 2017-11-26 NOTE — Progress Notes (Addendum)
TCTS DAILY ICU PROGRESS NOTE                   [address]            [place] [address]          [phone]   1 Day Post-Op Procedure(s) (LRB): CORONARY ARTERY BYPASS GRAFTING (CABG), ON PUMP, TIMES THREE, USING LEFT INTERNAL MAMMARY ARTERY AND ENDOSCOPICALLY HARVESTED LEFT GREATER SAPHENOUS VEIN (N/A) TRANSESOPHAGEAL ECHOCARDIOGRAM (TEE) (N/A)  Total Length of Stay:  LOS: 3 days   Subjective:  Patient with complaints of nausea, and difficulty coughing phlegm up this morning.  She has had multiple doses of Zofran without relief.  Objective: Vital signs in last 24 hours: Temp:  [94.1 F (34.5 C)-100 F (37.8 C)] 99.7 F (37.6 C) (01/18 0715) Pulse Rate:  [77-102] 91 (01/18 0715) Cardiac Rhythm: Normal sinus rhythm;Heart block (01/18 0200) Resp:  [7-38] 26 (01/18 0715) BP: (78-126)/(56-87) 107/76 (01/18 0700) SpO2:  [68 %-100 %] 98 % (01/18 0715) Arterial Line BP: (75-184)/(44-108) 116/65 (01/18 0715) FiO2 (%):  [36 %-50 %] 36 % (01/17 1738) Weight:  [154 lb (69.9 kg)] 154 lb (69.9 kg) (01/18 0300)  Filed Weights   11/24/17 0613 11/25/17 0546 11/26/17 0300  Weight: 146 lb (66.2 kg) 145 lb 6.4 oz (66 kg) 154 lb (69.9 kg)    Weight change: 8 lb 9.6 oz (3.901 kg)   Hemodynamic parameters for last 24 hours: PAP: (16-42)/(9-29) 36/18 CO:  [2.3 L/min-3.3 L/min] 3.1 L/min CI:  [1.4 L/min/m2-2 L/min/m2] 1.9 L/min/m2  Intake/Output from previous day: 01/17 0701 - 01/18 0700 In: 8797.5 [P.O.:120; I.V.:6054.5; Blood:473; IV [id number]:9326] Out: 7124 [Urine:3870; Blood:500; Chest Tube:540]  Current Meds: Scheduled Meds: . acetaminophen  1,000 mg Oral Q6H   Or  . acetaminophen (TYLENOL) oral liquid 160 mg/5 mL  1,000 mg Per Tube Q6H  . aspirin EC  325 mg Oral Daily   Or  . aspirin  324 mg Per Tube Daily  . atorvastatin  80 mg Oral q1800  . bisacodyl  10 mg Oral Daily   Or  . bisacodyl  10 mg Rectal Daily  . chlorhexidine gluconate (MEDLINE KIT)  15  mL Mouth Rinse BID  . Chlorhexidine Gluconate Cloth  6 each Topical Daily  . darunavir  800 mg Oral Q breakfast  . docusate sodium  200 mg Oral Daily  . elvitegravir-cobicistat-emtricitabine-tenofovir  1 tablet Oral Q breakfast  . insulin aspart  0-24 Units Subcutaneous Q4H  . mouth rinse  15 mL Mouth Rinse QID  . metoCLOPramide (REGLAN) injection  10 mg Intravenous Q6H  . metoprolol tartrate  12.5 mg Oral BID   Or  . metoprolol tartrate  12.5 mg Per Tube BID  . [START ON 11/27/2017] pantoprazole  40 mg Oral Daily  . sodium chloride flush  10-40 mL Intracatheter Q12H  . sodium chloride flush  3 mL Intravenous Q12H   Continuous Infusions: . sodium chloride 10 mL/hr at 11/26/17 0600  . sodium chloride 250 mL (11/25/17 1516)  . sodium chloride 20 mL/hr at 11/25/17 1319  . cefUROXime (ZINACEF)  IV Stopped (11/25/17 2045)  . dexmedetomidine (PRECEDEX) IV infusion Stopped (11/26/17 0500)  . lactated ringers 500 mL (11/25/17 1500)  . lactated ringers 10 mL/hr at 11/26/17 0600  . lactated ringers Stopped (11/25/17 2000)  . nitroGLYCERIN Stopped (11/25/17 1357)  . phenylephrine (NEO-SYNEPHRINE) Adult infusion 30 mcg/min (11/26/17 0600)   PRN Meds:.sodium chloride, lactated ringers, metoprolol tartrate, midazolam, morphine  injection, ondansetron (ZOFRAN) IV, oxyCODONE, sodium chloride flush, sodium chloride flush, traMADol  General appearance: alert, cooperative and no distress Heart: regular rate and rhythm Lungs: clear to auscultation bilaterally and coarse in upper airway Abdomen: soft, non-tender; bowel sounds normal; no masses,  no organomegaly Extremities: edema trace Wound: clean and dry, aquacel in place on sternotomy  Lab Results: CBC: Recent Labs    11/25/17 1845 11/25/17 1851 11/26/17 0403  WBC 10.0  --  9.7  HGB 10.6* 10.5* 10.1*  HCT 32.0* 31.0* 30.2*  PLT 100*  --  101*   BMET:  Recent Labs    11/25/17 1851 11/26/17 0403  NA 142 139  K 4.8 4.6  CL 110 110    CO2  --  22  GLUCOSE 158* 140*  BUN 9 7  CREATININE 0.70 0.87  CALCIUM  --  7.7*    CMET: Lab Results  Component Value Date   WBC 9.7 11/26/2017   HGB 10.1 (L) 11/26/2017   HCT 30.2 (L) 11/26/2017   PLT 101 (L) 11/26/2017   GLUCOSE 140 (H) 11/26/2017   CHOL 176 01/27/2017   TRIG 267 (H) 01/27/2017   HDL 36 (L) 01/27/2017   LDLCALC 87 01/27/2017   ALT 28 11/23/2017   AST 25 11/23/2017   NA 139 11/26/2017   K 4.6 11/26/2017   CL 110 11/26/2017   CREATININE 0.87 11/26/2017   BUN 7 11/26/2017   CO2 22 11/26/2017   TSH 1.160 11/19/2017   INR 1.49 11/25/2017   HGBA1C 5.6 11/23/2017      PT/INR:  Recent Labs    11/25/17 1238  LABPROT 17.8*  INR 1.49   Radiology: Dg Chest Port 1 View  Result Date: 11/25/2017 CLINICAL DATA:  Atelectasis. EXAM: PORTABLE CHEST 1 VIEW COMPARISON:  11/23/2017. FINDINGS: Endotracheal tube tip 1.7 cm above the carina. NG tube tip just at the GE junction. Distal advancement of approximately 10 cm should be considered. Swan-Ganz catheter with its tip in the pulmonary outflow tract. Mediastinal drainage catheters and left chest tube noted. Left chest tube noted coiled over the lower chest at the level of the hemidiaphragm. No pneumothorax. Prior CABG. Cardiomegaly with normal pulmonary vascularity. Low lung volumes with mild basilar atelectasis. Prior cervical spine fusion. IMPRESSION: 1. Lines and catheters including left chest tube as above. No pneumothorax. It should be noted that the NG tube tip is just at the level of the gastroesophageal junction and distal advancement of approximately 10 cm suggested. 2.  Prior CABG.  Mild basilar atelectasis. Electronically Signed   By: Marcello Moores  Register   On: 11/25/2017 13:20     Assessment/Plan: S/P Procedure(s) (LRB): CORONARY ARTERY BYPASS GRAFTING (CABG), ON PUMP, TIMES THREE, USING LEFT INTERNAL MAMMARY ARTERY AND ENDOSCOPICALLY HARVESTED LEFT GREATER SAPHENOUS VEIN (N/A) TRANSESOPHAGEAL ECHOCARDIOGRAM  (TEE) (N/A)  1. CV- NSR with mild tachycardia, BP remains labile, but is responding to albumin, and remains on Neo-synephrine which will be wean as hemodynamics allow 2. Pulm- wean oxygen as tolerated, CXR with atelectasis, small pleural fluid on left side, CT output has been 25-50 ml per hour.Marland Kitchen Possibly remove later today, will scheduled xopenex to help clear secretions 3. Renal- creatinine is stable, K is WNL, weight is mildly elevated, with labile BP and albumin being needed to get pressures up will hold off on diuretics for now 4. Expected post operative blood loss anemia, mild at 10.1 5. HIV- on home antiviral medications 6. GI- persistent nausea, will start reglan for additional relief 7. CBGs  controlled- no currently on insulin drip, no indication for Levemir at this time, will continue to monitor sugars and cover with SSIP if needed 8. Dispo- patient stable, wean NEO as hemodynamics allow, added reglan for additional nausea treatment, POD #1 progression orders     Ellwood Handler 11/26/2017 7:35 AM    I have seen and examined the patient and agree with the assessment and plan as outlined.  Doing well POD1.  Mobilize.  D/C lines.  D/C tubes later today or tomorrow, depending on output.  Hold diuretics until BP stable off Neo  Rexene Alberts, MD 11/26/2017 7:50 AM

## 2017-11-26 NOTE — Progress Notes (Signed)
      [address]       [place] [id number]             [phone]      POD # 1 CABG x 3  Resting comfortably, family present  BP 125/86   Pulse 100   Temp 98.5 F (36.9 C) (Oral)   Resp (!) 33   Ht 5\' 1"  (1.549 m)   Wt 154 lb (69.9 kg)   SpO2 91%   BMI 29.10 kg/m    Intake/Output Summary (Last 24 hours) at 11/26/2017 1841 Last data filed at 11/26/2017 1800 Gross per 24 hour  Intake 2114.62 ml  Output 1820 ml  Net 294.62 ml   K= 4.1, Hct=30  Doing well POD # 1  Jaymie Mckiddy C. Dorris FetchHendrickson, MD [facility] [phone]

## 2017-11-26 NOTE — Discharge Instructions (Signed)
Coronary Artery Bypass Grafting, Care After °This sheet gives you information about how to care for yourself after your procedure. Your health care provider may also give you more specific instructions. If you have problems or questions, contact your health care provider. °What can I expect after the procedure? °After the procedure, it is common to have: °· Nausea and a lack of appetite. °· Constipation. °· Weakness and fatigue. °· Depression or irritability. °· Pain or discomfort in your incision areas. ° °Follow these instructions at home: °Medicines °· Take over-the-counter and prescription medicines only as told by your health care provider. Do not stop taking medicines or start any new medicines without approval from your health care provider. °· If you were prescribed an antibiotic medicine, take it as told by your health care provider. Do not stop taking the antibiotic even if you start to feel better. °· Do not drive or use heavy machinery while taking prescription pain medicine. °Incision care °· Follow instructions from your health care provider about how to take care of your incisions. Make sure you: °? Wash your hands with soap and water before you change your bandage (dressing). If soap and water are not available, use hand sanitizer. °? Change your dressing as told by your health care provider. °? Leave stitches (sutures), skin glue, or adhesive strips in place. These skin closures may need to stay in place for 2 weeks or longer. If adhesive strip edges start to loosen and curl up, you may trim the loose edges. Do not remove adhesive strips completely unless your health care provider tells you to do that. °· Keep incision areas clean, dry, and protected. °· Check your incision areas every day for signs of infection. Check for: °? More redness, swelling, or pain. °? More fluid or blood. °? Warmth. °? Pus or a bad smell. °· If incisions were made in your legs: °? Avoid crossing your legs. °? Avoid  sitting for long periods of time. Change positions every 30 minutes. °? Raise (elevate) your legs when you are sitting. °Bathing °· Do not take baths, swim, or use a hot tub until your health care provider approves. °· Only take sponge baths. Pat the incisions dry. Do not rub incisions with a washcloth or towel. °· Ask your health care provider when you can shower. °Eating and drinking °· Eat foods that are high in fiber, such as raw fruits and vegetables, whole grains, beans, and nuts. Meats should be lean cut. Avoid canned, processed, and fried foods. This can help prevent constipation and is a recommended part of a heart-healthy diet. °· Drink enough fluid to keep your urine clear or pale yellow. °· Limit alcohol intake to no more than 1 drink a day for nonpregnant women and 2 drinks a day for men. One drink equals 12 oz of beer, 5 oz of wine, or 1½ oz of hard liquor. °Activity °· Rest and limit your activity as told by your health care provider. You may be instructed to: °? Stop any activity right away if you have chest pain, shortness of breath, irregular heartbeats, or dizziness. Get help right away if you have any of these symptoms. °? Move around frequently for short periods or take short walks as directed by your health care provider. Gradually increase your activities. You may need physical therapy or cardiac rehabilitation to help strengthen your muscles and build your endurance. °? Avoid lifting, pushing, or pulling anything that is heavier than 10 lb (4.5 kg) for at   least 6 weeks or as told by your health care provider. °· Do not drive until your health care provider approves. °· Ask your health care provider when you may return to work. °· Ask your health care provider when you may resume sexual activity. °General instructions °· Do not use any products that contain nicotine or tobacco, such as cigarettes and e-cigarettes. If you need help quitting, ask your health care provider. °· Take 2-3 deep  breaths every few hours during the day, while you recover. This helps expand your lungs and prevent complications like pneumonia after surgery. °· If you were given a device called an incentive spirometer, use it several times a day to practice deep breathing. Support your chest with a pillow or your arms when you take deep breaths or cough. °· Wear compression stockings as told by your health care provider. These stockings help to prevent blood clots and reduce swelling in your legs. °· Weigh yourself every day. This helps identify if your body is holding (retaining) fluid that may make your heart and lungs work harder. °· Keep all follow-up visits as told by your health care provider. This is important. °Contact a health care provider if: °· You have more redness, swelling, or pain around any incision. °· You have more fluid or blood coming from any incision. °· Any incision feels warm to the touch. °· You have pus or a bad smell coming from any incision °· You have a fever. °· You have swelling in your ankles or legs. °· You have pain in your legs. °· You gain 2 lb (0.9 kg) or more a day. °· You are nauseous or you vomit. °· You have diarrhea. °Get help right away if: °· You have chest pain that spreads to your jaw or arms. °· You are short of breath. °· You have a fast or irregular heartbeat. °· You notice a "clicking" in your breastbone (sternum) when you move. °· You have numbness or weakness in your arms or legs. °· You feel dizzy or light-headed. °Summary °· After the procedure, it is common to have pain or discomfort in the incision areas. °· Do not take baths, swim, or use a hot tub until your health care provider approves. °· Gradually increase your activities. You may need physical therapy or cardiac rehabilitation to help strengthen your muscles and build your endurance. °· Weigh yourself every day. This helps identify if your body is holding (retaining) fluid that may make your heart and lungs work  harder. °This information is not intended to replace advice given to you by your health care provider. Make sure you discuss any questions you have with your health care provider. °Document Released: 05/15/2005 Document Revised: 09/14/2016 Document Reviewed: 09/14/2016 °Elsevier Interactive Patient Education © 2018 Elsevier Inc. ° ° °Endoscopic Saphenous Vein Harvesting, Care After °Refer to this sheet in the next few weeks. These instructions provide you with information about caring for yourself after your procedure. Your health care provider may also give you more specific instructions. Your treatment has been planned according to current medical practices, but problems sometimes occur. Call your health care provider if you have any problems or questions after your procedure. °What can I expect after the procedure? °After the procedure, it is common to have: °· Pain. °· Bruising. °· Swelling. °· Numbness. ° °Follow these instructions at home: °Medicine °· Take over-the-counter and prescription medicines only as told by your health care provider. °· Do not drive or operate heavy machinery   while taking prescription pain medicine. °Incision care ° °· Follow instructions from your health care provider about how to take care of the cut made during surgery (incision). Make sure you: °? Wash your hands with soap and water before you change your bandage (dressing). If soap and water are not available, use hand sanitizer. °? Change your dressing as told by your health care provider. °? Leave stitches (sutures), skin glue, or adhesive strips in place. These skin closures may need to be in place for 2 weeks or longer. If adhesive strip edges start to loosen and curl up, you may trim the loose edges. Do not remove adhesive strips completely unless your health care provider tells you to do that. °· Check your incision area every day for signs of infection. Check for: °? More redness, swelling, or pain. °? More fluid or  blood. °? Warmth. °? Pus or a bad smell. °General instructions °· Raise (elevate) your legs above the level of your heart while you are sitting or lying down. °· Do any exercises your health care providers have given you. These may include deep breathing, coughing, and walking exercises. °· Do not shower, take baths, swim, or use a hot tub unless told by your health care provider. °· Wear your elastic stocking if told by your health care provider. °· Keep all follow-up visits as told by your health care provider. This is important. °Contact a health care provider if: °· Medicine does not help your pain. °· Your pain gets worse. °· You have new leg bruises or your leg bruises get bigger. °· You have a fever. °· Your leg feels numb. °· You have more redness, swelling, or pain around your incision. °· You have more fluid or blood coming from your incision. °· Your incision feels warm to the touch. °· You have pus or a bad smell coming from your incision. °Get help right away if: °· Your pain is severe. °· You develop pain, tenderness, warmth, redness, or swelling in any part of your leg. °· You have chest pain. °· You have trouble breathing. °This information is not intended to replace advice given to you by your health care provider. Make sure you discuss any questions you have with your health care provider. °Document Released: 07/08/2011 Document Revised: 04/02/2016 Document Reviewed: 09/09/2015 °Elsevier Interactive Patient Education © 2018 Elsevier Inc. ° ° °

## 2017-11-26 NOTE — Plan of Care (Signed)
  Activity: Risk for activity intolerance will decrease 11/26/2017 1624 - Progressing by Ivery QualeJackson, Marvion Bastidas A, RN Note Patient ambulated in hallway and sat in chair today.   Pain Managment: General experience of comfort will improve 11/26/2017 1624 - Progressing by Ivery QualeJackson, Jordi Kamm A, RN Note Patient pain still 8/10 with PRN pain medication but patient is doing well post-op

## 2017-11-26 NOTE — Anesthesia Postprocedure Evaluation (Signed)
Anesthesia Post Note  Patient: Altamese DillingRenee C Rozario  Procedure(s) Performed: CORONARY ARTERY BYPASS GRAFTING (CABG), ON PUMP, TIMES THREE, USING LEFT INTERNAL MAMMARY ARTERY AND ENDOSCOPICALLY HARVESTED LEFT GREATER SAPHENOUS VEIN (N/A Chest) TRANSESOPHAGEAL ECHOCARDIOGRAM (TEE) (N/A )     Patient location during evaluation: SICU Anesthesia Type: General Level of consciousness: oriented, patient cooperative and awake and alert Pain management: pain level controlled (improving with morphine) Vital Signs Assessment: post-procedure vital signs reviewed and stable Respiratory status: spontaneous breathing, nonlabored ventilation, respiratory function stable and patient connected to nasal cannula oxygen (extubated) Cardiovascular status: blood pressure returned to baseline and stable (weaning pressors this am) Postop Assessment: adequate PO intake (recent nausea after Morphine, improving) Anesthetic complications: no    Last Vitals:  Vitals:   11/26/17 0715 11/26/17 0836  BP:  138/78  Pulse: 91 93  Resp: (!) 26 (!) 27  Temp: 37.6 C 37.4 C  SpO2: 98% 98%    Last Pain:  Vitals:   11/26/17 0840  TempSrc:   PainSc: 10-Worst pain ever                 Valary Manahan,E. Itzabella Sorrels

## 2017-11-27 ENCOUNTER — Inpatient Hospital Stay (HOSPITAL_COMMUNITY): Payer: Medicare HMO

## 2017-11-27 DIAGNOSIS — Z951 Presence of aortocoronary bypass graft: Secondary | ICD-10-CM

## 2017-11-27 LAB — CBC
HEMATOCRIT: 30 % — AB (ref 36.0–46.0)
HEMOGLOBIN: 9.8 g/dL — AB (ref 12.0–15.0)
MCH: 28.7 pg (ref 26.0–34.0)
MCHC: 32.7 g/dL (ref 30.0–36.0)
MCV: 87.7 fL (ref 78.0–100.0)
Platelets: 107 10*3/uL — ABNORMAL LOW (ref 150–400)
RBC: 3.42 MIL/uL — AB (ref 3.87–5.11)
RDW: 20.9 % — ABNORMAL HIGH (ref 11.5–15.5)
WBC: 13.3 10*3/uL — ABNORMAL HIGH (ref 4.0–10.5)

## 2017-11-27 LAB — BASIC METABOLIC PANEL
ANION GAP: 9 (ref 5–15)
BUN: 8 mg/dL (ref 6–20)
CALCIUM: 8.4 mg/dL — AB (ref 8.9–10.3)
CHLORIDE: 103 mmol/L (ref 101–111)
CO2: 23 mmol/L (ref 22–32)
Creatinine, Ser: 0.84 mg/dL (ref 0.44–1.00)
GFR calc non Af Amer: >60 mL/min (ref >60)
GLUCOSE: 137 mg/dL — AB (ref 65–99)
Potassium: 4.1 mmol/L (ref 3.5–5.1)
Sodium: 135 mmol/L (ref 135–145)

## 2017-11-27 LAB — GLUCOSE, CAPILLARY
GLUCOSE-CAPILLARY: 149 mg/dL — AB (ref 65–99)
Glucose-Capillary: 132 mg/dL — ABNORMAL HIGH (ref 65–99)
Glucose-Capillary: 126 mg/dL — ABNORMAL HIGH (ref 65–99)
Glucose-Capillary: 151 mg/dL — ABNORMAL HIGH (ref 65–99)
Glucose-Capillary: 122 mg/dL — ABNORMAL HIGH (ref 65–99)

## 2017-11-27 MED ORDER — FUROSEMIDE 10 MG/ML IJ SOLN
40.0000 mg | Freq: Once | INTRAMUSCULAR | Status: AC
  Administered 2017-11-27: 40 mg via INTRAVENOUS
  Filled 2017-11-27: qty 4

## 2017-11-27 MED ORDER — INSULIN ASPART 100 UNIT/ML ~~LOC~~ SOLN
0.0000 [IU] | Freq: Three times a day (TID) | SUBCUTANEOUS | Status: DC
Start: 2017-11-27 — End: 2017-11-29
  Administered 2017-11-27: 3 [IU] via SUBCUTANEOUS
  Administered 2017-11-27 – 2017-11-28 (×2): 2 [IU] via SUBCUTANEOUS

## 2017-11-27 NOTE — Progress Notes (Signed)
      [address]       [place] [id number]             [phone]      No complaints BP (!) 125/108   Pulse (!) 108   Temp 98.3 F (36.8 C) (Oral)   Resp (!) 29   Ht 5\' 1"  (1.549 m)   Wt 161 lb 11.2 oz (73.3 kg)   SpO2 94%   BMI 30.55 kg/m   Intake/Output Summary (Last 24 hours) at 11/27/2017 1857 Last data filed at 11/27/2017 1800 Gross per 24 hour  Intake 1500 ml  Output 3925 ml  Net -2425 ml   Diuresed well with lasix  Viviann SpareSteven C. Dorris FetchHendrickson, MD [facility] [phone]

## 2017-11-27 NOTE — Progress Notes (Signed)
2 Days Post-Op Procedure(s) (LRB): CORONARY ARTERY BYPASS GRAFTING (CABG), ON PUMP, TIMES THREE, USING LEFT INTERNAL MAMMARY ARTERY AND ENDOSCOPICALLY HARVESTED LEFT GREATER SAPHENOUS VEIN (N/A) TRANSESOPHAGEAL ECHOCARDIOGRAM (TEE) (N/A) Subjective: "I'm doing OK"  Objective: Vital signs in last 24 hours: Temp:  [98.2 F (36.8 C)-99.7 F (37.6 C)] 98.6 F (37 C) (01/19 0900) Pulse Rate:  [87-121] 104 (01/19 0900) Cardiac Rhythm: Normal sinus rhythm (01/19 0400) Resp:  [11-36] 25 (01/19 0900) BP: (98-151)/(66-114) 141/94 (01/19 0900) SpO2:  [90 %-100 %] 93 % (01/19 0900) Arterial Line BP: (107-177)/(56-87) 134/70 (01/18 1330) FiO2 (%):  [28 %] 28 % (01/18 1446) Weight:  [161 lb 11.2 oz (73.3 kg)] 161 lb 11.2 oz (73.3 kg) (01/19 0500)  Hemodynamic parameters for last 24 hours: PAP: (30-38)/(14-20) 38/20  Intake/Output from previous day: 01/18 0701 - 01/19 0700 In: 821.1 [P.O.:480; I.V.:291.1; IV Piggyback:50] Out: 1920 [Urine:1435; Chest Tube:485] Intake/Output this shift: No intake/output data recorded.  General appearance: alert, cooperative and no distress Neurologic: intact Heart: regular rate and rhythm Lungs: diminished breath sounds bibasilar Abdomen: normal findings: soft, non-tender  Lab Results: Recent Labs    11/26/17 1630 11/26/17 1646 11/27/17 0311  WBC 11.6*  --  13.3*  HGB 10.1* 10.2* 9.8*  HCT 30.7* 30.0* 30.0*  PLT 102*  --  107*   BMET:  Recent Labs    11/26/17 0403  11/26/17 1646 11/27/17 0311  NA 139  --  138 135  K 4.6  --  4.1 4.1  CL 110  --  100* 103  CO2 22  --   --  23  GLUCOSE 140*  --  193* 137*  BUN 7  --  8 8  CREATININE 0.87   < > 0.70 0.84  CALCIUM 7.7*  --   --  8.4*   < > = values in this interval not displayed.    PT/INR:  Recent Labs    11/25/17 1238  LABPROT 17.8*  INR 1.49   ABG    Component Value Date/Time   PHART 7.291 (L) 11/25/2017 1845   HCO3 23.5 11/25/2017 1845   TCO2 24 11/26/2017 1646   ACIDBASEDEF 3.0 (H) 11/25/2017 1845   O2SAT 99.0 11/25/2017 1845   CBG (last 3)  Recent Labs    11/26/17 1939 11/26/17 2351 11/27/17 0312  GLUCAP 198* 175* 132*    Assessment/Plan: S/P Procedure(s) (LRB): CORONARY ARTERY BYPASS GRAFTING (CABG), ON PUMP, TIMES THREE, USING LEFT INTERNAL MAMMARY ARTERY AND ENDOSCOPICALLY HARVESTED LEFT GREATER SAPHENOUS VEIN (N/A) TRANSESOPHAGEAL ECHOCARDIOGRAM (TEE) (N/A) POD # 2  CV- stable. ASA, statin, beta blocker  RESP- CXR shows a small to moderate right effusion  IS, diuresis  RENAL- creatinine and lytes OK, weight up- diurese  ENDO- CBG elevated yesterday morning, improved later in day  [facility]  Continue to mobilize as tolerated     LOS: 4 days    Loreli SlotSteven C Caylah Plouff 11/27/2017

## 2017-11-28 ENCOUNTER — Inpatient Hospital Stay (HOSPITAL_COMMUNITY): Payer: Medicare HMO

## 2017-11-28 LAB — BASIC METABOLIC PANEL
ANION GAP: 13 (ref 5–15)
BUN: 8 mg/dL (ref 6–20)
CHLORIDE: 100 mmol/L — AB (ref 101–111)
CO2: 25 mmol/L (ref 22–32)
Calcium: 8.7 mg/dL — ABNORMAL LOW (ref 8.9–10.3)
Creatinine, Ser: 0.94 mg/dL (ref 0.44–1.00)
GFR calc Af Amer: >60 mL/min (ref >60)
GLUCOSE: 136 mg/dL — AB (ref 65–99)
POTASSIUM: 3.9 mmol/L (ref 3.5–5.1)
Sodium: 138 mmol/L (ref 135–145)

## 2017-11-28 LAB — CBC
HEMATOCRIT: 28.3 % — AB (ref 36.0–46.0)
Hemoglobin: 9.4 g/dL — ABNORMAL LOW (ref 12.0–15.0)
MCH: 29.5 pg (ref 26.0–34.0)
MCHC: 33.2 g/dL (ref 30.0–36.0)
MCV: 88.7 fL (ref 78.0–100.0)
Platelets: 130 10*3/uL — ABNORMAL LOW (ref 150–400)
RBC: 3.19 MIL/uL — AB (ref 3.87–5.11)
RDW: 20.3 % — ABNORMAL HIGH (ref 11.5–15.5)
WBC: 9.4 10*3/uL (ref 4.0–10.5)

## 2017-11-28 LAB — GLUCOSE, CAPILLARY
Glucose-Capillary: 115 mg/dL — ABNORMAL HIGH (ref 65–99)
Glucose-Capillary: 124 mg/dL — ABNORMAL HIGH (ref 65–99)
Glucose-Capillary: 109 mg/dL — ABNORMAL HIGH (ref 65–99)
Glucose-Capillary: 132 mg/dL — ABNORMAL HIGH (ref 65–99)

## 2017-11-28 MED ORDER — ZOLPIDEM TARTRATE 5 MG PO TABS: 5.0000 mg | ORAL_TABLET | Freq: Every evening | ORAL | Status: DC | PRN

## 2017-11-28 MED ORDER — METOPROLOL TARTRATE 25 MG PO TABS
25.0000 mg | ORAL_TABLET | Freq: Two times a day (BID) | ORAL | Status: DC
  Administered 2017-11-28: 25 mg via ORAL
  Filled 2017-11-28: qty 1

## 2017-11-28 MED ORDER — SODIUM CHLORIDE 0.9% FLUSH
3.0000 mL | Freq: Two times a day (BID) | INTRAVENOUS | Status: DC
  Administered 2017-11-29: 3 mL via INTRAVENOUS

## 2017-11-28 MED ORDER — SODIUM CHLORIDE 0.9% FLUSH: 3.0000 mL | INTRAVENOUS | Status: DC | PRN

## 2017-11-28 MED ORDER — SODIUM CHLORIDE 0.9 % IV SOLN: 250.0000 mL | INTRAVENOUS | Status: DC | PRN

## 2017-11-28 MED ORDER — LEVALBUTEROL HCL 0.63 MG/3ML IN NEBU: 0.6300 mg | INHALATION_SOLUTION | Freq: Four times a day (QID) | RESPIRATORY_TRACT | Status: DC | PRN

## 2017-11-28 MED ORDER — MOVING RIGHT ALONG BOOK
Freq: Once | Status: AC
  Administered 2017-11-28: 18:00:00
  Filled 2017-11-28: qty 1

## 2017-11-28 MED ORDER — FUROSEMIDE 40 MG PO TABS
40.0000 mg | ORAL_TABLET | Freq: Every day | ORAL | Status: DC
  Administered 2017-11-28 – 2017-11-30 (×3): 40 mg via ORAL
  Filled 2017-11-28 (×3): qty 1

## 2017-11-28 MED ORDER — POTASSIUM CHLORIDE CRYS ER 20 MEQ PO TBCR
20.0000 meq | EXTENDED_RELEASE_TABLET | Freq: Two times a day (BID) | ORAL | Status: DC
  Administered 2017-11-28 – 2017-11-30 (×5): 20 meq via ORAL
  Filled 2017-11-28 (×5): qty 1

## 2017-11-28 MED ORDER — MAGNESIUM HYDROXIDE 400 MG/5ML PO SUSP: 30.0000 mL | Freq: Every day | ORAL | Status: DC | PRN

## 2017-11-28 NOTE — Progress Notes (Signed)
[facility]  [facility]. All belongings are accounted for. Family is present.

## 2017-11-28 NOTE — Progress Notes (Signed)
2nd attempt top call report.

## 2017-11-28 NOTE — Progress Notes (Signed)
Patient arrived from 2 heart, Vital signs obtained and ccmd called will monitor patient. Riddhi Grether, Randall AnKristin Jessup RN

## 2017-11-28 NOTE — Progress Notes (Signed)
First attempt to call report. RN to call back, contact number left with Diplomatic Services operational officersecretary.

## 2017-11-28 NOTE — Plan of Care (Signed)
Progressing as expected

## 2017-11-28 NOTE — Progress Notes (Signed)
3 Days Post-Op Procedure(s) (LRB): CORONARY ARTERY BYPASS GRAFTING (CABG), ON PUMP, TIMES THREE, USING LEFT INTERNAL MAMMARY ARTERY AND ENDOSCOPICALLY HARVESTED LEFT GREATER SAPHENOUS VEIN (N/A) TRANSESOPHAGEAL ECHOCARDIOGRAM (TEE) (N/A) Subjective: No complaints this AM  Objective: Vital signs in last 24 hours: Temp:  [98.3 F (36.8 C)-99.4 F (37.4 C)] 99.1 F (37.3 C) (01/20 0358) Pulse Rate:  [93-111] 101 (01/20 0700) Cardiac Rhythm: Sinus tachycardia (01/20 0740) Resp:  [19-30] 26 (01/20 0700) BP: (99-171)/(61-108) 122/80 (01/20 0700) SpO2:  [92 %-99 %] 99 % (01/20 0700) Weight:  [153 lb 10.6 oz (69.7 kg)] 153 lb 10.6 oz (69.7 kg) (01/20 0455)  Hemodynamic parameters for last 24 hours:    Intake/Output from previous day: 01/19 0701 - 01/20 0700 In: 1380 [P.O.:1320; I.V.:60] Out: 2875 [Urine:2770; Chest Tube:105] Intake/Output this shift: No intake/output data recorded.  General appearance: alert, cooperative and no distress Neurologic: intact Heart: tachy, regular Lungs: diminished breath sounds bibasilar Abdomen: normal findings: soft, non-tender  Lab Results: Recent Labs    11/27/17 0311 11/28/17 0334  WBC 13.3* 9.4  HGB 9.8* 9.4*  HCT 30.0* 28.3*  PLT 107* 130*   BMET:  Recent Labs    11/27/17 0311 11/28/17 0334  NA 135 138  K 4.1 3.9  CL 103 100*  CO2 23 25  GLUCOSE 137* 136*  BUN 8 8  CREATININE 0.84 0.94  CALCIUM 8.4* 8.7*    PT/INR:  Recent Labs    11/25/17 1238  LABPROT 17.8*  INR 1.49   ABG    Component Value Date/Time   PHART 7.291 (L) 11/25/2017 1845   HCO3 23.5 11/25/2017 1845   TCO2 24 11/26/2017 1646   ACIDBASEDEF 3.0 (H) 11/25/2017 1845   O2SAT 99.0 11/25/2017 1845   CBG (last 3)  Recent Labs    11/27/17 1716 11/27/17 2135 11/28/17 0820  GLUCAP 149* 122* 115*    Assessment/Plan: S/P Procedure(s) (LRB): CORONARY ARTERY BYPASS GRAFTING (CABG), ON PUMP, TIMES THREE, USING LEFT INTERNAL MAMMARY ARTERY AND  ENDOSCOPICALLY HARVESTED LEFT GREATER SAPHENOUS VEIN (N/A) TRANSESOPHAGEAL ECHOCARDIOGRAM (TEE) (N/A) Plan for transfer to step-down: see transfer orders  CV- sinus tachy- will increase lopressor  RESP- continue IS. Pleural effusions on CXR- continue diuresis  PA/ lat CXR in AM  RENAL- creatinine and lytes OK  Change to PO lasix, supplement K  ENDO- CBG well controlled  DVT prophylaxis- SCD + enoxaparin  Ambulating well, continue cardiac rehab   LOS: 5 days    Loreli SlotSteven C Armen Waring 11/28/2017

## 2017-11-29 ENCOUNTER — Inpatient Hospital Stay (HOSPITAL_COMMUNITY): Payer: Medicare HMO

## 2017-11-29 LAB — POCT I-STAT, CHEM 8
BUN: 15 mg/dL (ref 6–20)
BUN: 11 mg/dL (ref 6–20)
BUN: 10 mg/dL (ref 6–20)
CALCIUM ION: 1.26 mmol/L (ref 1.15–1.40)
CALCIUM ION: 0.98 mmol/L — AB (ref 1.15–1.40)
CREATININE: 0.7 mg/dL (ref 0.44–1.00)
CREATININE: 0.5 mg/dL (ref 0.44–1.00)
CREATININE: 0.5 mg/dL (ref 0.44–1.00)
Calcium, Ion: 0.97 mmol/L — ABNORMAL LOW (ref 1.15–1.40)
Chloride: 100 mmol/L — ABNORMAL LOW (ref 101–111)
Chloride: 101 mmol/L (ref 101–111)
Chloride: 106 mmol/L (ref 101–111)
GLUCOSE: 111 mg/dL — AB (ref 65–99)
Glucose, Bld: 117 mg/dL — ABNORMAL HIGH (ref 65–99)
Glucose, Bld: 117 mg/dL — ABNORMAL HIGH (ref 65–99)
HCT: 36 % (ref 36.0–46.0)
HCT: 23 % — ABNORMAL LOW (ref 36.0–46.0)
HEMATOCRIT: 22 % — AB (ref 36.0–46.0)
HEMOGLOBIN: 7.8 g/dL — AB (ref 12.0–15.0)
HEMOGLOBIN: 7.5 g/dL — AB (ref 12.0–15.0)
Hemoglobin: 12.2 g/dL (ref 12.0–15.0)
POTASSIUM: 5.9 mmol/L — AB (ref 3.5–5.1)
Potassium: 4.1 mmol/L (ref 3.5–5.1)
Potassium: 4.1 mmol/L (ref 3.5–5.1)
Sodium: 140 mmol/L (ref 135–145)
Sodium: 140 mmol/L (ref 135–145)
Sodium: 144 mmol/L (ref 135–145)
TCO2: 28 mmol/L (ref 22–32)
TCO2: 28 mmol/L (ref 22–32)
TCO2: 25 mmol/L (ref 22–32)

## 2017-11-29 LAB — CBC
HCT: 31.8 % — ABNORMAL LOW (ref 36.0–46.0)
Hemoglobin: 10.6 g/dL — ABNORMAL LOW (ref 12.0–15.0)
MCH: 29.5 pg (ref 26.0–34.0)
MCHC: 33.3 g/dL (ref 30.0–36.0)
MCV: 88.6 fL (ref 78.0–100.0)
Platelets: 187 10*3/uL (ref 150–400)
RBC: 3.59 MIL/uL — ABNORMAL LOW (ref 3.87–5.11)
RDW: 19.5 % — AB (ref 11.5–15.5)
WBC: 8.1 10*3/uL (ref 4.0–10.5)

## 2017-11-29 LAB — BASIC METABOLIC PANEL
Anion gap: 12 (ref 5–15)
BUN: 10 mg/dL (ref 6–20)
CALCIUM: 9.1 mg/dL (ref 8.9–10.3)
CO2: 26 mmol/L (ref 22–32)
CREATININE: 1.15 mg/dL — AB (ref 0.44–1.00)
Chloride: 100 mmol/L — ABNORMAL LOW (ref 101–111)
GFR calc Af Amer: >60 mL/min (ref >60)
GFR calc non Af Amer: 53 mL/min — ABNORMAL LOW (ref >60)
GLUCOSE: 131 mg/dL — AB (ref 65–99)
Potassium: 3.5 mmol/L (ref 3.5–5.1)
Sodium: 138 mmol/L (ref 135–145)

## 2017-11-29 LAB — POCT I-STAT 3, ART BLOOD GAS (G3+)
ACID-BASE EXCESS: 1 mmol/L (ref 0.0–2.0)
Acid-base deficit: 3 mmol/L — ABNORMAL HIGH (ref 0.0–2.0)
BICARBONATE: 25.4 mmol/L (ref 20.0–28.0)
Bicarbonate: 21.8 mmol/L (ref 20.0–28.0)
O2 Saturation: 100 %
O2 Saturation: 100 %
PO2 ART: 345 mmHg — AB (ref 83.0–108.0)
TCO2: 27 mmol/L (ref 22–32)
TCO2: 23 mmol/L (ref 22–32)
pCO2 arterial: 36 mmHg (ref 32.0–48.0)
pCO2 arterial: 37.2 mmHg (ref 32.0–48.0)
pH, Arterial: 7.457 — ABNORMAL HIGH (ref 7.350–7.450)
pH, Arterial: 7.376 (ref 7.350–7.450)
pO2, Arterial: 420 mmHg — ABNORMAL HIGH (ref 83.0–108.0)

## 2017-11-29 LAB — GLUCOSE, CAPILLARY: Glucose-Capillary: 108 mg/dL — ABNORMAL HIGH (ref 65–99)

## 2017-11-29 MED ORDER — METOPROLOL TARTRATE 25 MG PO TABS
37.5000 mg | ORAL_TABLET | Freq: Two times a day (BID) | ORAL | Status: DC
  Administered 2017-11-29 – 2017-11-30 (×3): 37.5 mg via ORAL
  Filled 2017-11-29 (×3): qty 1

## 2017-11-29 MED ORDER — POTASSIUM CHLORIDE CRYS ER 20 MEQ PO TBCR
40.0000 meq | EXTENDED_RELEASE_TABLET | Freq: Once | ORAL | Status: AC
  Administered 2017-11-29: 40 meq via ORAL
  Filled 2017-11-29: qty 2

## 2017-11-29 NOTE — Progress Notes (Signed)
CARDIAC REHAB PHASE I   PRE:  Rate/Rhythm: 116 ST    BP: sitting 129/79    SaO2: 95 RA  MODE:  Ambulation: 470 ft   POST:  Rate/Rhythm: 124 ST    BP: sitting 122/83     SaO2: 98 RA  Pt able to stand and walk with RW, no assist needed. Very slow. Her thoughts seem slow at times too. Some congestion, no production. HR elevated. To recliner, to BR, then recliner again. Moving fairly well. Encouraged more walking, IS, flutter. [phone]   Cathy MassonRandi Kristan Teia Freitas CES, ACSM 11/29/2017 9:11 AM

## 2017-11-29 NOTE — Progress Notes (Signed)
EPWs pulled per protocol, pt tolerated well, will continue to monitor.

## 2017-11-29 NOTE — Care Management Important Message (Signed)
Important Message  Patient Details  Name: Cathy Smith MRN: [id number] Date of Birth: 12/14/1965   Medicare Important Message Given:  Yes    Darrold SpanWebster, Lisaann Atha Hall, RN 11/29/2017, 4:18 PM

## 2017-11-29 NOTE — Plan of Care (Signed)
  Progressing Health Behavior/Discharge Planning: Ability to manage health-related needs will improve 11/29/2017 2303 - Progressing by Renelda MomWorley, Blakelynn Scheeler L, RN Clinical Measurements: Ability to maintain clinical measurements within normal limits will improve 11/29/2017 2303 - Progressing by Renelda MomWorley, Jayleena Stille L, RN Will remain free from infection 11/29/2017 2303 - Progressing by Renelda MomWorley, Christepher Melchior L, RN

## 2017-11-29 NOTE — Progress Notes (Addendum)
      [address]       [place] [id number]             [phone]        4 Days Post-Op Procedure(s) (LRB): CORONARY ARTERY BYPASS GRAFTING (CABG), ON PUMP, TIMES THREE, USING LEFT INTERNAL MAMMARY ARTERY AND ENDOSCOPICALLY HARVESTED LEFT GREATER SAPHENOUS VEIN (N/A) TRANSESOPHAGEAL ECHOCARDIOGRAM (TEE) (N/A)  Subjective: Patient eating breakfast. Her only complaints are a cough (at times productive) and then incisional pain.  Objective: Vital signs in last 24 hours: Temp:  [97.9 F (36.6 C)-99.2 F (37.3 C)] 98.9 F (37.2 C) (01/21 0532) Pulse Rate:  [54-131] 103 (01/21 0532) Cardiac Rhythm: Normal sinus rhythm (01/21 0709) Resp:  [18-36] 18 (01/21 0532) BP: (113-132)/(74-91) 125/91 (01/21 0532) SpO2:  [90 %-100 %] 94 % (01/21 0532)  Pre op weight 66 kg Current Weight  11/28/17 153 lb 10.6 oz (69.7 kg)      Intake/Output from previous day: 01/20 0701 - 01/21 0700 In: 1200 [P.O.:1200] Out: 300 [Urine:300]   Physical Exam:  Cardiovascular: Tachycardia Pulmonary: Diminished at bases Abdomen: Soft, non tender, bowel sounds present. Extremities: Mild bilateral lower extremity edema. Wounds: Aquacel removed. Sternal wound is clean and dry.    Lab Results: CBC: Recent Labs    11/28/17 0334 11/29/17 0300  WBC 9.4 8.1  HGB 9.4* 10.6*  HCT 28.3* 31.8*  PLT 130* 187   BMET:  Recent Labs    11/28/17 0334 11/29/17 0300  NA 138 138  K 3.9 3.5  CL 100* 100*  CO2 25 26  GLUCOSE 136* 131*  BUN 8 10  CREATININE 0.94 1.15*  CALCIUM 8.7* 9.1    PT/INR:  Lab Results  Component Value Date   INR 1.49 11/25/2017   INR 1.02 11/23/2017   INR 1.0 11/19/2017   ABG:  INR: Will add last result for INR, ABG once components are confirmed Will add last 4 CBG results once components are confirmed  Assessment/Plan:  1. CV - ST this am. On Lopressor 25 mg bid. Will increase Lopressor for better HR control. 2.  Pulmonary - On room air. CXR this  am shows no pneumothorax, bilateral pleural effusions (decreased from previous CXR). Encourage incentive spirometer. 3. Volume Overload - On Lasix 40 mg daily 4.  Acute blood loss anemia - H and H stable at 10.6 and 31.8 5. Supplement potassium 6. Remove wires 7. CBGs 109/132/108. Pre op HGA1C 5.6. Stop accu checks and SS PRN 8. Possible discharge 1-2 days  Lelon HuhDonielle M [facility] LLCZimmermanPA-C 11/29/2017,7:44 AM Patient seen and examined, agree with above  Viviann SpareSteven C. Dorris FetchHendrickson, MD [facility] [phone]

## 2017-11-30 ENCOUNTER — Other Ambulatory Visit: Payer: Self-pay | Admitting: Physician Assistant

## 2017-11-30 MED ORDER — ASPIRIN 325 MG PO TBEC: 325.0000 mg | DELAYED_RELEASE_TABLET | Freq: Every day | ORAL | 0 refills | Status: DC

## 2017-11-30 MED ORDER — ACETAMINOPHEN 500 MG PO TABS: 1000.0000 mg | ORAL_TABLET | Freq: Four times a day (QID) | ORAL | 0 refills | Status: DC | PRN

## 2017-11-30 MED ORDER — POTASSIUM CHLORIDE CRYS ER 20 MEQ PO TBCR: 20.0000 meq | EXTENDED_RELEASE_TABLET | Freq: Every day | ORAL | 0 refills | Status: DC

## 2017-11-30 MED ORDER — OXYCODONE HCL 5 MG PO TABS: 5.0000 mg | ORAL_TABLET | ORAL | 0 refills | Status: DC | PRN

## 2017-11-30 MED ORDER — ATORVASTATIN CALCIUM 80 MG PO TABS: 80.0000 mg | ORAL_TABLET | Freq: Every day | ORAL | 3 refills | Status: DC

## 2017-11-30 MED ORDER — ATORVASTATIN CALCIUM 20 MG PO TABS: 20.0000 mg | ORAL_TABLET | Freq: Every day | ORAL | 3 refills | Status: DC

## 2017-11-30 MED ORDER — FUROSEMIDE 40 MG PO TABS: 40.0000 mg | ORAL_TABLET | Freq: Every day | ORAL | 0 refills | Status: DC

## 2017-11-30 MED ORDER — METOPROLOL TARTRATE 37.5 MG PO TABS: 37.5000 mg | ORAL_TABLET | Freq: Two times a day (BID) | ORAL | 3 refills | Status: DC

## 2017-11-30 NOTE — Consult Note (Signed)
[facility] CM Primary Care Navigator  11/30/2017  Cathy Smith 1966/08/13 [id number]    Met with patient, husband Cathy Smith) and mother Cathy Smith) at the bedside to identify possible discharge needs. Patientreports having "chest pain and shortness of breath" thatresultedto this admission/ surgery(CORONARY ARTERY BYPASS GRAFTING).  Patient endorses Dr. Molli Barrows with [place] ([facility] [place]) as theprimary care provider.   [facility] [address] [facility].  Sheverbalizedmanaginghermedications at [employer] out of the containers with husband's assistance.Plan to use "pill box" system upon discharge as stated.  Patient mentioned that her husbandprovidestransportation to herdoctors'appointments.   [employer] transportation benefits discussed with patient/ husband.  Patientstates that husband is the primarycaregiver at home but her mother will assist with care needs as well.  Anticipated discharge plan ishome according to patient and RN report.  Patientand husband voicedunderstanding tocall primary care provider's officewhen she returns home,for a post dischargevisitwithin1-2 weeksor sooner if needed.Patient letter (with PCP's contact number) was provided as a reminder.  Discussed with patient andfamily regarding [facility] CM services available for health management at homebutdenied any healthissuesat this point. Patientand husband expressed understandingof needto seekreferral from primary care provider to [facility] care management ifdeemed necessary and appropriatefor anyservices in the future.   [facility] care management information was provided for future needs thatshemay have.  Patienthadopted andverbally agreed for EMMI calls to monitor her recovery.  Referral made for [facility] General calls to follow-up after discharge.   For additional questions  please contact:  Edwena Felty A. Alira Fretwell, BSN, RN-BC [facility] PRIMARY CARE Navigator Cell: [phone]

## 2017-11-30 NOTE — Progress Notes (Signed)
CT sutures removed as ordered and steri strips applied. Patient reviewed discharge instructions medication list and paper prescriptions given. All questions were answered IV and tele were dcd will discharge home as ordered. Caryl Fate, Randall AnKristin Jessup RN

## 2017-11-30 NOTE — Progress Notes (Signed)
Ed completed with pt and family. Good reception, appropriate questions. Will refer to [place] [facility]. She is awaiting a RW.  [phone] Ethelda ChickKristan Melanee Cordial CES, ACSM 11:16 AM 11/30/2017

## 2017-11-30 NOTE — Progress Notes (Signed)
Rolling walker ordered as requested to be delivered to the patient's room today prior to discharging home. Abelino DerrickB Bengie Kaucher [facility] LLCRN,MHA,BSN [phone]

## 2017-12-01 ENCOUNTER — Other Ambulatory Visit: Payer: Self-pay | Admitting: Physician Assistant

## 2017-12-02 ENCOUNTER — Other Ambulatory Visit: Payer: Self-pay

## 2017-12-02 NOTE — Patient Outreach (Signed)
[facility] Care Management  12/02/2017  Altamese DillingRenee C Drzewiecki 09/03/1966 [id number]   EMMI- General Discharge RED ON EMMI ALERT Day # 1 Date:  12/01/16 Red Alert Reason: Know who to call about changes in condition-no.  Spoke with patient she is able to verify HIPAA. Addressed red EMMI with patient and discussed contacting her physicians for non-emergent issues and emergent issues going to the emergency room.  She verbalized understanding.  Patient has appointments next week for cardiac and primary physicians.  Patient reports that her husband has to pick up her new medication as that [facility] did not have it when he went but states he will be going today to pick up medication.  Advised patient that if [facility] does not have the medications that they can get the medication from another pharmacy such as [facility] or [facility].  She verbalized understanding.  Patient states that she does have some pain in her chest when she coughs but uses her pillow to help support her chest.  Patient does have pain medication as needed.  Advised patient to take medication if needed to keep pain under control. She verbalized understanding.    Discussed [facility] with patient.  She states she is doing ok and no need for services at this time.  Discussed continuation on automated phone calls and possibility of future nurse calls.  She verbalized understanding.    Plan: RN CM will close case at this time and notify care management assistant of case status.  Bary Lericheionne J Harman Ferrin, RN, MSN [facility] LPHN Care Management Care Management Coordinator Direct Line [phone] Toll Free: [phone]  Fax: [phone]

## 2017-12-03 ENCOUNTER — Telehealth (HOSPITAL_COMMUNITY): Payer: Self-pay

## 2017-12-03 NOTE — Telephone Encounter (Signed)
Patients insurance is active and benefits verified through [facility] - $10.00 co-pay, no deductible, out of pocket amount of $3,400/$100.24 has been met, no co-insurance, and no pre-authorization is required. Passport/reference [phone]  Patient will be contacted and scheduled after their follow up appt with the Cardiologist office upon review by the RN Navigator.

## 2017-12-07 ENCOUNTER — Ambulatory Visit: Payer: Medicare HMO | Admitting: Cardiovascular Disease

## 2017-12-07 ENCOUNTER — Encounter: Payer: Self-pay | Admitting: Cardiovascular Disease

## 2017-12-07 VITALS — BP 114/72 | HR 89 | Ht 61.0 in | Wt 146.0 lb

## 2017-12-07 DIAGNOSIS — I10 Essential (primary) hypertension: Secondary | ICD-10-CM

## 2017-12-07 DIAGNOSIS — Z951 Presence of aortocoronary bypass graft: Secondary | ICD-10-CM | POA: Diagnosis not present

## 2017-12-07 DIAGNOSIS — E781 Pure hyperglyceridemia: Secondary | ICD-10-CM

## 2017-12-07 LAB — LIPID PANEL
CHOL/HDL RATIO: 3.3 ratio (ref 0.0–4.4)
CHOLESTEROL TOTAL: 92 mg/dL — AB (ref 100–199)
HDL: 28 mg/dL — ABNORMAL LOW (ref >39)
LDL CALC: 29 mg/dL (ref 0–99)
Triglycerides: 176 mg/dL — ABNORMAL HIGH (ref 0–149)
VLDL CHOLESTEROL CAL: 35 mg/dL (ref 5–40)

## 2017-12-07 LAB — HEPATIC FUNCTION PANEL
ALBUMIN: 4.6 g/dL (ref 3.5–5.5)
ALT: 22 IU/L (ref 0–32)
AST: 18 IU/L (ref 0–40)
Alkaline Phosphatase: 81 IU/L (ref 39–117)
Bilirubin Total: 0.6 mg/dL (ref 0.0–1.2)
Bilirubin, Direct: 0.22 mg/dL (ref 0.00–0.40)
TOTAL PROTEIN: 7.5 g/dL (ref 6.0–8.5)

## 2017-12-07 NOTE — Assessment & Plan Note (Signed)
History of hyperlipidemia on statin therapy. We will recheck a lipid and liver profile 

## 2017-12-07 NOTE — Assessment & Plan Note (Signed)
History of essential hypertension blood pressure was 114/72. She is on metoprolol. Continue current meds at current dosing.

## 2017-12-07 NOTE — Progress Notes (Signed)
12/07/2017 Cathy DillingRenee C Oldaker   08/17/1966  [id number]  Primary Physician Bing NeighborsHarris, Kimberly S, FNP Primary Cardiologist: Runell GessJonathan J Berry MD Nicholes CalamityFACP, FACC, FAHA, [place]  HPI:  Cathy Smith is a 52 y.o.  moderately overweight married African-American female with no children. Does not work. She was referred to me byKimberly Harris FNPfor new onset chest pain. I last saw her in the office 11/19/17.She really has no cardiac risk factors other than hypertension hyperlipidemia both of which are treated. There is no family history. She does not smoke. She had onset of chest pain 3-4 months ago,, currently trying to 3 times a week lasting up to 5-10 minutes at a time associated with shortness of breath. she had a Myoview stress test that showed severe ischemia in the LAD territory. She presents underwent cardiac catheterization by myself 11/22/17 revealing left main/three-vessel disease with preserved LV function. She underwent coronary artery bypass graft 31/17/19 by Dr. Ashley Marinerub Owen with a  LIMA to LAD, vein to obtuse marginal branch and  Vein to the posterior lateral branch of the RCA. Her postoperative course was uncomplicated.  Current Meds  Medication Sig  . acetaminophen (TYLENOL) 500 MG tablet Take 2 tablets (1,000 mg total) by mouth every 6 (six) hours as needed.  Marland Kitchen. aspirin EC 325 MG EC tablet Take 1 tablet (325 mg total) by mouth daily.  Marland Kitchen. atorvastatin (LIPITOR) 20 MG tablet Take 1 tablet (20 mg total) by mouth daily at 6 PM.  . furosemide (LASIX) 40 MG tablet TAKE 1 TABLET BY MOUTH DAILY  . GENVOYA 150-150-200-10 MG TABS tablet TAKE 1 TABLET BY MOUTH DAILY WITH BREAKFAST  . Metoprolol Tartrate 37.5 MG TABS Take 37.5 mg by mouth 2 (two) times daily.  Marland Kitchen. oxyCODONE (OXY IR/ROXICODONE) 5 MG immediate release tablet Take 1 tablet (5 mg total) by mouth every 4 (four) hours as needed for severe pain.  . potassium chloride SA (K-DUR,KLOR-CON) 20 MEQ tablet Take 1 tablet (20 mEq total) by mouth daily.  Marland Kitchen.  PREZISTA 800 MG tablet TAKE 1 TABLET(800 MG) BY MOUTH DAILY     No Known Allergies  Social History   Socioeconomic History  . Marital status: Married    Spouse name: Not on file  . Number of children: Not on file  . Years of education: Not on file  . Highest education level: Not on file  Social Needs  . Financial resource strain: Not on file  . Food insecurity - worry: Not on file  . Food insecurity - inability: Not on file  . Transportation needs - medical: Not on file  . Transportation needs - non-medical: Not on file  Occupational History  . Not on file  Tobacco Use  . Smoking status: Never Smoker  . Smokeless tobacco: Never Used  Substance and Sexual Activity  . Alcohol use: No    Alcohol/week: 0.0 oz  . Drug use: No  . Sexual activity: Yes    Partners: Male    Birth control/protection: Condom    Comment: pt. given condoms  Other Topics Concern  . Not on file  Social History Narrative  . Not on file     Review of Systems: General: negative for chills, fever, night sweats or weight changes.  Cardiovascular: negative for chest pain, dyspnea on exertion, edema, orthopnea, palpitations, paroxysmal nocturnal dyspnea or shortness of breath Dermatological: negative for rash Respiratory: negative for cough or wheezing Urologic: negative for hematuria Abdominal: negative for nausea, vomiting, diarrhea, bright red blood  per rectum, melena, or hematemesis Neurologic: negative for visual changes, syncope, or dizziness All other systems reviewed and are otherwise negative except as noted above.    Blood pressure 114/72, pulse 89, height 5\' 1"  (1.549 m), weight 146 lb (66.2 kg).  General appearance: alert and no distress Neck: no adenopathy, no carotid bruit, no JVD, supple, symmetrical, trachea midline and thyroid not enlarged, symmetric, no tenderness/mass/nodules Lungs: clear to auscultation bilaterally Heart: regular rate and rhythm, S1, S2 normal, no murmur, click,  rub or gallop Extremities: extremities normal, atraumatic, no cyanosis or edema Pulses: 2+ and symmetric Skin: Skin color, texture, turgor normal. No rashes or lesions Neurologic: Alert and oriented X 3, normal strength and tone. Normal symmetric reflexes. Normal coordination and gait  EKG sinus rhythm at 89 with left axis deviation and poor R-wave progression with anterolateral T-wave inversion. I personally reviewed this EKG.  ASSESSMENT AND PLAN:   Essential hypertension History of essential hypertension blood pressure was 114/72. She is on metoprolol. Continue current meds at current dosing.  Hypertriglyceridemia without hypercholesterolemia History of hyperlipidemia on statin therapy. We will recheck a lipid and liver profile.  S/P CABG x 3 History of CAD status post cardiac catheterization by myself 11/22/17 after him IV stress test prior to that was high risk for anterior ischemia. This showed left main/three-vessel disease with preserved LV function. 3 days later she underwent coronary artery bypass grafting 3 by Dr. Ashley Mariner  with a LIMA to the LAD, vein to the first obtuse marginal branch of the circumflex and a vein to the right posterior lateral branch. Her postoperative was uncomplicated. She does not smoke. I suggested that she participate in the cardiac rehabilitation program. I will see her back in 3 months. Her sternotomy is healing well.      Runell Gess MD FACP,FACC,FAHA, [facility] 12/07/2017 9:08 AM

## 2017-12-07 NOTE — Assessment & Plan Note (Signed)
History of CAD status post cardiac catheterization by myself 11/22/17 after him IV stress test prior to that was high risk for anterior ischemia. This showed left main/three-vessel disease with preserved LV function. 3 days later she underwent coronary artery bypass grafting 3 by Dr. Ashley Marinerub Owen  with a LIMA to the LAD, vein to the first obtuse marginal branch of the circumflex and a vein to the right posterior lateral branch. Her postoperative was uncomplicated. She does not smoke. I suggested that she participate in the cardiac rehabilitation program. I will see her back in 3 months. Her sternotomy is healing well.

## 2017-12-07 NOTE — Patient Instructions (Addendum)
Medication Instructions: Your physician recommends that you continue on your current medications as directed. Please refer to the Current Medication list given to you today.   Labwork: Your physician recommends that you return for a FASTING lipid profile and hepatic function panel today.   Follow-Up: Your physician recommends that you schedule a follow-up appointment in: 3 months with Dr. Allyson SabalBerry.   Schedule appt with Cardiac Rehab--in appt notes, says ready for initial scheduling.

## 2017-12-08 ENCOUNTER — Ambulatory Visit (INDEPENDENT_AMBULATORY_CARE_PROVIDER_SITE_OTHER): Payer: Medicare HMO | Admitting: Family Medicine

## 2017-12-08 ENCOUNTER — Encounter: Payer: Self-pay | Admitting: Family Medicine

## 2017-12-08 VITALS — BP 100/66 | HR 80 | Temp 98.1°F | Ht 61.0 in | Wt 146.0 lb

## 2017-12-08 DIAGNOSIS — E559 Vitamin D deficiency, unspecified: Secondary | ICD-10-CM | POA: Diagnosis not present

## 2017-12-08 DIAGNOSIS — Z951 Presence of aortocoronary bypass graft: Secondary | ICD-10-CM

## 2017-12-08 DIAGNOSIS — I25119 Atherosclerotic heart disease of native coronary artery with unspecified angina pectoris: Secondary | ICD-10-CM

## 2017-12-08 NOTE — Progress Notes (Signed)
Patient ID: Cathy DillingRenee C Swann, female    DOB: 10/15/1966, 52 y.o.   MRN: [id number]  PCP: Bing NeighborsHarris, Brittini Brubeck S, FNP  Chief Complaint  Patient presents with  . Hospitalization Follow-up    Subjective:  HPI Cathy Smith is a 52 y.o. female with HIV, CAD, Hypertension, presents for hospital follow-up. Cathy Smith recently underwent CABG in which 3 stents were placed after experiencing persistent angina and  abnormal stress test.  Initial plan was for Cathy Smith to undergo heart cath to restore left ventricular functioning however during procedure it was noted that she had significant blockages left main 3 vessels.  Subsequently she underwent a CABG with 3 stents placements LAD, OMB, and RCA on 11/25/2016 and discharged on 11/30/2017.  Remainder of hospital stay was unremarkable.  Today she reports no chest pain, shortness of breath, dizziness, or palpitations.  She continues to experience some discomfort within her sternal region when she is coughing sneezing.  She denies any purulent drainage from her sternal incision site.  She is performing daily dressing change. Has successfully manage pain with Tylenol.  Cathy Smith was also prescribed vitamin D replacement during her last office visit due to having a low vitamin D level.  She subsequently had taken more than prescribed of the vitamin D as she thought she was supposed to take the replacement daily opposed to once weekly.  At that time she has stopped taking vitamin D and would like to have a new vitamin D level checked today. Social History   Socioeconomic History  . Marital status: Married    Spouse name: Not on file  . Number of children: Not on file  . Years of education: Not on file  . Highest education level: Not on file  Social Needs  . Financial resource strain: Not on file  . Food insecurity - worry: Not on file  . Food insecurity - inability: Not on file  . Transportation needs - medical: Not on file  . Transportation needs - non-medical: Not on file   Occupational History  . Not on file  Tobacco Use  . Smoking status: Never Smoker  . Smokeless tobacco: Never Used  Substance and Sexual Activity  . Alcohol use: No    Alcohol/week: 0.0 oz  . Drug use: No  . Sexual activity: Yes    Partners: Male    Birth control/protection: Condom    Comment: pt. given condoms  Other Topics Concern  . Not on file  Social History Narrative  . Not on file    Family History  Problem Relation Age of Onset  . Cataracts Mother   . Hypertension Father   . Colon cancer Neg Hx    Review of Systems  Constitutional: Positive for fatigue.  Respiratory: Negative.   Cardiovascular: Negative.   Musculoskeletal: Negative.   Skin:       Sternal wound pain with exertion, cough, and sneezes   Hematological: Negative.   Psychiatric/Behavioral: Negative.     Patient Active Problem List   Diagnosis Date Noted  . 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 CAD, multiple vessel 11/23/2017  . CAD (coronary artery disease) 11/22/2017  . Abnormal stress test   . Chest pain 11/12/2017  . LGSIL Pap smear of vagina 04/28/2017  . 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 Sinusitis 12/30/2015  . History of fusion of cervical spine 08/27/2015  . 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 Physical exam, annual 09/06/2014  . Need for Tdap vaccination 09/06/2014  .  Alopecia of scalp 09/06/2014  . Urinary tract infection symptoms 08/15/2014  . Hypertriglyceridemia without hypercholesterolemia 08/08/2014  . Other and unspecified hyperlipidemia 01/23/2013  . Dysuria 07/13/2012  . Abnormal cervical Papanicolaou smear 10/22/2011  . Essential hypertension 06/12/2009  . MONILIASIS, ORAL 09/24/2008  . HX, PERSONAL, PAST NONCOMPLIANCE 11/24/2006  . APHTHOUS ULCERS 08/18/2006  . Human immunodeficiency virus (HIV) disease (HCC) 09/15/2000  . HYSTERECTOMY, HX OF 11/10/1983    No Known Allergies  Prior to Admission medications   Medication Sig Start Date End Date  Taking? Authorizing Provider  acetaminophen (TYLENOL) 500 MG tablet Take 2 tablets (1,000 mg total) by mouth every 6 (six) hours as needed. 11/30/17  Yes Barrett, Rae Roam, PA-C  aspirin EC 325 MG EC tablet Take 1 tablet (325 mg total) by mouth daily. 11/30/17  Yes Barrett, Erin R, PA-C  atorvastatin (LIPITOR) 20 MG tablet Take 1 tablet (20 mg total) by mouth daily at 6 PM. 11/30/17  Yes Conte, Tessa N, PA-C  furosemide (LASIX) 40 MG tablet TAKE 1 TABLET BY MOUTH DAILY 12/01/17  Yes Barrett, Erin R, PA-C  GENVOYA 150-150-200-10 MG TABS tablet TAKE 1 TABLET BY MOUTH DAILY WITH BREAKFAST 09/08/17  Yes Ginnie Smart, MD  Metoprolol Tartrate 37.5 MG TABS Take 37.5 mg by mouth 2 (two) times daily. 11/30/17  Yes Barrett, Erin R, PA-C  oxyCODONE (OXY IR/ROXICODONE) 5 MG immediate release tablet Take 1 tablet (5 mg total) by mouth every 4 (four) hours as needed for severe pain. 11/30/17  Yes Barrett, Erin R, PA-C  potassium chloride SA (K-DUR,KLOR-CON) 20 MEQ tablet Take 1 tablet (20 mEq total) by mouth daily. 11/30/17  Yes Barrett, Erin R, PA-C  PREZISTA 800 MG tablet TAKE 1 TABLET(800 MG) BY MOUTH DAILY 09/08/17  Yes Ginnie Smart, MD    Past Medical, Surgical Family and Social History reviewed and updated.    Objective:   Today's Vitals   12/08/17 1129  BP: 100/66  Pulse: 80  Temp: 98.1 F (36.7 C)  TempSrc: Oral  SpO2: 97%  Weight: 146 lb (66.2 kg)  Height: 5\' 1"  (1.549 m)    Wt Readings from Last 3 Encounters:  12/08/17 146 lb (66.2 kg)  12/07/17 146 lb (66.2 kg)  11/30/17 146 lb 12.8 oz (66.6 kg)   Physical Exam  Constitutional: She is oriented to person, place, and time. She appears well-developed and well-nourished.  Neck: Normal range of motion. Neck supple.  Cardiovascular: Normal rate, regular rhythm, normal heart sounds and intact distal pulses.  Pulmonary/Chest: Effort normal and breath sounds normal.  Musculoskeletal: Normal range of motion.  Neurological: She is alert  and oriented to person, place, and time.  Skin: There is erythema.  Sternal wound is intact, clean, dry- negative of drainage, negative of expanding erythema  Psychiatric: She has a normal mood and affect. Her behavior is normal. Judgment and thought content normal.   Assessment & Plan:  1. Coronary artery disease with angina pectoris, unspecified vessel or lesion type, unspecified whether native or transplanted heart [facility]), angina has resolved with recent CABG.  Currently prescribed metoprolol, Lasix, and high-dose aspirin daily.  Educated regarding consistent adherence to a dash diet and implementing physical activity once tolerated to facilitate optimal cardiovascular health.  2. S/P CABG x 3, sternal incision is clean dry and intact.  No evidence of infection.  Patient is ambulating without distress, shortness of breath, and is negative of edema.  Lung sounds were clear.  Patient is to follow-up with cardiothoracic  surgeon on 01/03/2018 and cardiology on 03/08/2018 Dr. York Ram MD.  3. Vitamin D deficiency, advised to discontinue vitamin D replacement at present.  I will repeat a vitamin D level today.  Remains low patient has been educated on the appropriate method of taking once weekly medications.  She verbalized understanding.   Orders Placed This Encounter  Procedures  . CBC with Differential  . Comprehensive metabolic panel  . Magnesium  . Vitamin D, 25-hydroxy    Return for care in 6 months routine wellness visit.   Godfrey Pick. Tiburcio Pea, MSN, FNP-C [facility]  [address] Sherian Maroon [place], [place] [address] [phone]

## 2017-12-08 NOTE — Patient Instructions (Signed)
Cardiac Rehabilitation What is cardiac rehabilitation? Cardiac rehabilitation is a treatment program that helps improve the health and well-being of people who have heart problems. Cardiac rehabilitation includes exercise training, education, and counseling to help you get stronger and return to an active lifestyle. This program can help you get better faster and reduce any future hospital stays. Why might I need cardiac rehabilitation?  Cardiac rehabilitation programs can help when you have or have had:  A heart attack.  Heart failure.  Peripheral artery disease.  Coronary artery disease.  Angina.  Lung or breathing problems.  Cardiac rehabilitation programs are also used when you have had:  Coronary artery bypass graft surgery.  Heart valve replacement.  Heart stent placement.  Heart transplant.  Aneurysm repair.  What are the benefits of cardiac rehabilitation? Cardiac rehabilitation can help:  Reduce problems like chest pain and trouble breathing.  Change risk factors that contribute to heart disease, such as: ? Smoking. ? High blood pressure. ? High cholesterol. ? Diabetes. ? Being out of shape or not active. ? Weighing more than 30% higher than your ideal weight. ? Diet.  Improve your mental outlook so you feel: ? More hopeful. ? Better about yourself. ? More confident about taking care of yourself.  Get support from health experts as well as other people with similar problems.  Learn how to manage and understand your medicines.  Teach your family about your condition and how to participate in your recovery.  What happens in cardiac rehabilitation? You will be assessed by a cardiac rehabilitation team. They will check your health history and do a physical exam. You may need blood tests, stress tests, and other evaluations to make sure that you are ready to start cardiac rehabilitation. The cardiac rehabilitation team works with you to make a plan based  on your health and goals. Your program will be tailored to fit you and your needs and may change as you progress. You may work with a health care team that includes:  Doctors.  Nurses.  Dietitians.  Psychologists.  Exercise specialists.  Physical and occupational therapists.  What are the phases of cardiac rehabilitation? A cardiac rehabilitation program is often divided into phases. You advance from one phase to the next. Phase One This phase starts while you are still in the hospital. You may start by walking in your room and then in the hall. You may start some simple exercises with a therapist. Phase Two This phase begins when you go home or to another facility. This phase may last 8-12 weeks. You will travel to a cardiac rehabilitation center or another place where rehabilitation is offered. You will slowly increase your activity level while being closely watched by a nurse or therapist. Exercises may include a combination of strength or resistance training and "cardio" or aerobic movement on a treadmill or other machines. Your condition will determine how often and how long these sessions last. In phase two, you may learn how to cook healthy meals, control your blood sugar, and manage your medicines. You may need help with scheduling or planning how and when to take your medicines. If you have questions about your medicines, it is very important that you talk to your health care provider. Phase Three This phase continues for the rest of your life. There will be less supervision. You may still participate in cardiac rehabilitation activities or become part of a group in your community. You may benefit from talking about your experience with other people who  are facing similar challenges. Get help right away if:  You have severe chest discomfort, especially if the pain is crushing or pressure-like and spreads to your arms, back, neck, or jaw. Do not wait to see if the pain will go  away.  You have weakness or numbness in your face, arms, or legs, especially on one side of the body.  Your speech is slurred.  You are confused.  You have a sudden severe headache or loss of vision.  You have shortness of breath.  You are sweating and have nausea.  You feel dizzy or faint.  You are fatigued. These symptoms may represent a serious problem that is an emergency. Do not wait to see if the symptoms will go away. Get medical help right away. Call your local emergency services (911 in the [place] Do not drive yourself to the hospital. This information is not intended to replace advice given to you by your health care provider. Make sure you discuss any questions you have with your health care provider. Document Released: 08/04/2008 Document Revised: 10/12/2016 Document Reviewed: 09/09/2015 Elsevier Interactive Patient Education  2018 Elsevier Inc. Coronary Artery Bypass Grafting, Care After These instructions give you information on caring for yourself after your procedure. Your doctor may also give you more specific instructions. Call your doctor if you have any problems or questions after your procedure. Follow these instructions at home:  Only take medicine as told by your doctor. Take medicines exactly as told. Do not stop taking medicines or start any new medicines without talking to your doctor first.  Take your pulse as told by your doctor.  Do deep breathing as told by your doctor. Use your breathing device (incentive spirometer), if given, to practice deep breathing several times a day. Support your chest with a pillow or your arms when you take deep breaths or cough.  Keep the area clean, dry, and protected where the surgery cuts (incisions) were made. Remove bandages (dressings) only as told by your doctor. If strips were applied to surgical area, do not take them off. They fall off on their own.  Check the surgery area daily for puffiness (swelling), redness,  or leaking fluid.  If surgery cuts were made in your legs: ? Avoid crossing your legs. ? Avoid sitting for long periods of time. Change positions every 30 minutes. ? Raise your legs when you are sitting. Place them on pillows.  Wear stockings that help keep blood clots from forming in your legs (compression stockings).  Only take sponge baths until your doctor says it is okay to take showers. Pat the surgery area dry. Do not rub the surgery area with a washcloth or towel. Do not bathe, swim, or use a hot tub until your doctor says it is okay.  Eat foods that are high in fiber. These include raw fruits and vegetables, whole grains, beans, and nuts. Choose lean meats. Avoid canned, processed, and fried foods.  Drink enough fluids to keep your pee (urine) clear or pale yellow.  Weigh yourself every day.  Rest and limit activity as told by your doctor. You may be told to: ? Stop any activity if you have chest pain, shortness of breath, changes in heartbeat, or dizziness. Get help right away if this happens. ? Move around often for short amounts of time or take short walks as told by your doctor. Gradually become more active. You may need help to strengthen your muscles and build endurance. ? Avoid lifting, pushing, or  pulling anything heavier than 10 pounds (4.5 kg) for at least 6 weeks after surgery.  Do not drive until your doctor says it is okay.  Ask your doctor when you can go back to work.  Ask your doctor when you can begin sexual activity again.  Follow up with your doctor as told. Contact a doctor if:  You have puffiness, redness, more pain, or fluid draining from the incision site.  You have a fever.  You have puffiness in your ankles or legs.  You have pain in your legs.  You gain 2 or more pounds (0.9 kg) a day.  You feel sick to your stomach (nauseous) or throw up (vomit).  You have watery poop (diarrhea). Get help right away if:  You have chest pain that goes  to your jaw or arms.  You have shortness of breath.  You have a fast or irregular heartbeat.  You notice a "clicking" in your breastbone when you move.  You have numbness or weakness in your arms or legs.  You feel dizzy or light-headed. This information is not intended to replace advice given to you by your health care provider. Make sure you discuss any questions you have with your health care provider. Document Released: 10/31/2013 Document Revised: 04/02/2016 Document Reviewed: 04/04/2013 Elsevier Interactive Patient Education  2017 [employer].

## 2017-12-09 ENCOUNTER — Ambulatory Visit: Payer: Medicare HMO | Admitting: Interventional Cardiology

## 2017-12-09 ENCOUNTER — Telehealth: Payer: Self-pay | Admitting: Family Medicine

## 2017-12-09 LAB — CBC WITH DIFFERENTIAL/PLATELET
BASOS: 1 %
Basophils Absolute: 0.1 10*3/uL (ref 0.0–0.2)
EOS (ABSOLUTE): 0.1 10*3/uL (ref 0.0–0.4)
Eos: 2 %
Hematocrit: 31.4 % — ABNORMAL LOW (ref 34.0–46.6)
Hemoglobin: 10.1 g/dL — ABNORMAL LOW (ref 11.1–15.9)
IMMATURE GRANULOCYTES: 0 %
Immature Grans (Abs): 0 10*3/uL (ref 0.0–0.1)
LYMPHS ABS: 1.4 10*3/uL (ref 0.7–3.1)
Lymphs: 20 %
MCH: 29 pg (ref 26.6–33.0)
MCHC: 32.2 g/dL (ref 31.5–35.7)
MCV: 90 fL (ref 79–97)
MONOS ABS: 0.9 10*3/uL (ref 0.1–0.9)
Monocytes: 12 %
NEUTROS PCT: 65 %
Neutrophils Absolute: 4.4 10*3/uL (ref 1.4–7.0)
PLATELETS: 393 10*3/uL — AB (ref 150–379)
RBC: 3.48 x10E6/uL — AB (ref 3.77–5.28)
RDW: 17.4 % — AB (ref 12.3–15.4)
WBC: 6.8 10*3/uL (ref 3.4–10.8)

## 2017-12-09 LAB — COMPREHENSIVE METABOLIC PANEL
A/G RATIO: 1.5 (ref 1.2–2.2)
ALK PHOS: 87 IU/L (ref 39–117)
ALT: 30 IU/L (ref 0–32)
AST: 21 IU/L (ref 0–40)
Albumin: 4.6 g/dL (ref 3.5–5.5)
BILIRUBIN TOTAL: 0.5 mg/dL (ref 0.0–1.2)
BUN/Creatinine Ratio: 14 (ref 9–23)
BUN: 14 mg/dL (ref 6–24)
CALCIUM: 9.6 mg/dL (ref 8.7–10.2)
CHLORIDE: 99 mmol/L (ref 96–106)
CO2: 22 mmol/L (ref 20–29)
Creatinine, Ser: 1.02 mg/dL — ABNORMAL HIGH (ref 0.57–1.00)
GFR calc Af Amer: 72 mL/min/{1.73_m2} (ref >59)
GFR, EST NON AFRICAN AMERICAN: 63 mL/min/{1.73_m2} (ref >59)
Globulin, Total: 3.1 g/dL (ref 1.5–4.5)
Glucose: 106 mg/dL — ABNORMAL HIGH (ref 65–99)
POTASSIUM: 5 mmol/L (ref 3.5–5.2)
SODIUM: 138 mmol/L (ref 134–144)
Total Protein: 7.7 g/dL (ref 6.0–8.5)

## 2017-12-09 LAB — VITAMIN D 25 HYDROXY (VIT D DEFICIENCY, FRACTURES): VIT D 25 HYDROXY: 83.7 ng/mL (ref 30.0–100.0)

## 2017-12-09 LAB — MAGNESIUM: MAGNESIUM: 1.9 mg/dL (ref 1.6–2.3)

## 2017-12-09 NOTE — Telephone Encounter (Signed)
Patient notified and will stop Vitamin D

## 2017-12-09 NOTE — Telephone Encounter (Signed)
Contact patient to advise that recent labs were consistent with baseline.  Her vitamin D level is normal therefore she should not resume the vitamin D weekly therapy.  Keep next scheduled follow-up.  Godfrey PickKimberly S. Tiburcio PeaHarris, MSN, FNP-C [facility]  [address] [place], [place] [address] [phone]

## 2017-12-13 ENCOUNTER — Telehealth (HOSPITAL_COMMUNITY): Payer: Self-pay

## 2017-12-13 NOTE — Telephone Encounter (Signed)
Attempted to call patient in regards to Cardiac Rehab - lm on vm °

## 2017-12-14 ENCOUNTER — Telehealth (HOSPITAL_COMMUNITY): Payer: Self-pay

## 2017-12-14 NOTE — Telephone Encounter (Signed)
Patient returned phone call in regards to Cardiac Rehab - Patient is interested in the program. Scheduled orientation on 01/27/2018 at 8:30am. Patient will attend the 9:45am exc class.

## 2017-12-20 ENCOUNTER — Telehealth: Payer: Self-pay

## 2017-12-20 NOTE — Telephone Encounter (Signed)
Called to ask if she could continue her daily routines and attend church.  Instructed patient that she could walk and attend church.  She can pick up the house but no lifting, pushing, or pulling anything over 10 lbs.  Also explained that she can ride in the car as long as she is not in the car sitting for long periods of time.  Patient understands receipt and was made aware of her follow-up appointment on 01/03/2018.

## 2017-12-30 ENCOUNTER — Other Ambulatory Visit: Payer: Self-pay | Admitting: Thoracic Surgery (Cardiothoracic Vascular Surgery)

## 2017-12-30 DIAGNOSIS — Z951 Presence of aortocoronary bypass graft: Secondary | ICD-10-CM

## 2018-01-03 ENCOUNTER — Ambulatory Visit: Payer: Self-pay

## 2018-01-04 ENCOUNTER — Ambulatory Visit (INDEPENDENT_AMBULATORY_CARE_PROVIDER_SITE_OTHER): Payer: Self-pay | Admitting: Physician Assistant

## 2018-01-04 ENCOUNTER — Ambulatory Visit
Admission: RE | Admit: 2018-01-04 | Discharge: 2018-01-04 | Disposition: A | Discharge status: Home / Self Care | Payer: Medicare HMO | Source: Ambulatory Visit | Attending: Thoracic Surgery (Cardiothoracic Vascular Surgery) | Admitting: Thoracic Surgery (Cardiothoracic Vascular Surgery)

## 2018-01-04 ENCOUNTER — Other Ambulatory Visit: Payer: Self-pay

## 2018-01-04 VITALS — BP 99/68 | HR 68 | Resp 18 | Ht 61.0 in | Wt 145.6 lb

## 2018-01-04 DIAGNOSIS — Z951 Presence of aortocoronary bypass graft: Secondary | ICD-10-CM

## 2018-01-04 DIAGNOSIS — J90 Pleural effusion, not elsewhere classified: Secondary | ICD-10-CM | POA: Diagnosis not present

## 2018-01-04 NOTE — Progress Notes (Signed)
HPI:  Patient returns for routine postoperative follow-up having undergone CABG x 3 11/25/2017.  The patient's early postoperative recovery while in the hospital was unremarkable.  Since hospital discharge the patient reports she is doing well for the most part. She does notice that her left chest is numb, but overall this has improved since surgery.  She is ambulating without difficulty.  Her incisions are healing without issues.  Her husband and her have a lot of questions regarding diet.  They have a poor understanding of healthy eating options and seem to eat out at fast food places quite frequently.  Patient and husbands questions addressed and they were encouraged to try and limit their intake of processed, fast food. They were encouraged to intake lean meats, fish, and vegetables   Current Outpatient Medications  Medication Sig Dispense Refill  . acetaminophen (TYLENOL) 500 MG tablet Take 2 tablets (1,000 mg total) by mouth every 6 (six) hours as needed. 30 tablet 0  . aspirin EC 325 MG EC tablet Take 1 tablet (325 mg total) by mouth daily. 30 tablet 0  . atorvastatin (LIPITOR) 20 MG tablet Take 1 tablet (20 mg total) by mouth daily at 6 PM. 30 tablet 3  . furosemide (LASIX) 40 MG tablet TAKE 1 TABLET BY MOUTH DAILY 90 tablet 0  . GENVOYA 150-150-200-10 MG TABS tablet TAKE 1 TABLET BY MOUTH DAILY WITH BREAKFAST 30 tablet 4  . Metoprolol Tartrate 37.5 MG TABS Take 37.5 mg by mouth 2 (two) times daily. 60 tablet 3  . oxyCODONE (OXY IR/ROXICODONE) 5 MG immediate release tablet Take 1 tablet (5 mg total) by mouth every 4 (four) hours as needed for severe pain. 30 tablet 0  . potassium chloride SA (K-DUR,KLOR-CON) 20 MEQ tablet Take 1 tablet (20 mEq total) by mouth daily. 7 tablet 0  . PREZISTA 800 MG tablet TAKE 1 TABLET(800 MG) BY MOUTH DAILY 30 tablet 4   No current facility-administered medications for this visit.     Physical Exam:  BP 99/68 (BP Location: Left Arm, Patient Position:  Sitting, Cuff Size: Normal)   Pulse 68   Resp 18   Ht 5\' 1"  (1.549 m)   Wt 145 lb 9.6 oz (66 kg)   SpO2 98% Comment: RA  BMI 27.51 kg/m   Gen: no apparent distress Heart: RRR Lungs: CTA bilaterally Abd: soft non-tender, non-distended Ext: no edema Incisions: well healed  Diagnostic Tests:  CXR: minimal pleural fluid, sternal wires in tact  A/P:  1. S/P CABG x 3 doing very well 2. Diet education provided, can refer patient to dietician for more education if needed in the future 3. RTC in 3 months for follow up with Dr. Charna Busmanwen  Ninel Abdella, PA-C [facility] [phone]

## 2018-01-19 ENCOUNTER — Other Ambulatory Visit (HOSPITAL_COMMUNITY)
Admission: RE | Admit: 2018-01-19 | Discharge: 2018-01-19 | Disposition: A | Discharge status: Home / Self Care | Payer: Medicare HMO | Source: Ambulatory Visit | Attending: Gynecologic Oncology | Admitting: Gynecologic Oncology

## 2018-01-19 ENCOUNTER — Ambulatory Visit (INDEPENDENT_AMBULATORY_CARE_PROVIDER_SITE_OTHER): Payer: Medicare HMO | Admitting: Infectious Diseases

## 2018-01-19 ENCOUNTER — Encounter: Payer: Self-pay | Admitting: Gynecologic Oncology

## 2018-01-19 ENCOUNTER — Encounter: Payer: Self-pay | Admitting: Infectious Diseases

## 2018-01-19 ENCOUNTER — Inpatient Hospital Stay: Payer: Medicare HMO | Attending: Gynecologic Oncology | Admitting: Gynecologic Oncology

## 2018-01-19 ENCOUNTER — Other Ambulatory Visit (HOSPITAL_COMMUNITY)
Admission: RE | Admit: 2018-01-19 | Discharge: 2018-01-19 | Disposition: A | Discharge status: Home / Self Care | Payer: Medicare HMO | Source: Ambulatory Visit | Attending: Infectious Diseases | Admitting: Infectious Diseases

## 2018-01-19 VITALS — BP 108/75 | HR 66 | Temp 97.9°F | Resp 18 | Wt 147.0 lb

## 2018-01-19 VITALS — BP 125/82 | HR 65 | Temp 98.2°F | Ht 61.0 in | Wt 147.0 lb

## 2018-01-19 DIAGNOSIS — D071 Carcinoma in situ of vulva: Secondary | ICD-10-CM | POA: Diagnosis not present

## 2018-01-19 DIAGNOSIS — Z90710 Acquired absence of both cervix and uterus: Secondary | ICD-10-CM

## 2018-01-19 DIAGNOSIS — R87810 Cervical high risk human papillomavirus (HPV) DNA test positive: Secondary | ICD-10-CM | POA: Diagnosis not present

## 2018-01-19 DIAGNOSIS — E781 Pure hyperglyceridemia: Secondary | ICD-10-CM

## 2018-01-19 DIAGNOSIS — B20 Human immunodeficiency virus [HIV] disease: Secondary | ICD-10-CM

## 2018-01-19 DIAGNOSIS — Z951 Presence of aortocoronary bypass graft: Secondary | ICD-10-CM

## 2018-01-19 DIAGNOSIS — N870 Mild cervical dysplasia: Secondary | ICD-10-CM | POA: Insufficient documentation

## 2018-01-19 DIAGNOSIS — Z113 Encounter for screening for infections with a predominantly sexual mode of transmission: Secondary | ICD-10-CM

## 2018-01-19 DIAGNOSIS — D072 Carcinoma in situ of vagina: Secondary | ICD-10-CM

## 2018-01-19 DIAGNOSIS — I2510 Atherosclerotic heart disease of native coronary artery without angina pectoris: Secondary | ICD-10-CM | POA: Diagnosis not present

## 2018-01-19 DIAGNOSIS — Z01419 Encounter for gynecological examination (general) (routine) without abnormal findings: Secondary | ICD-10-CM | POA: Diagnosis not present

## 2018-01-19 NOTE — Assessment & Plan Note (Signed)
She is doing well on her salvage regimen.  vax are up to date.  In HIV+ relationship.  Offered/refused condoms.  rtc in 6 months Labs today.

## 2018-01-19 NOTE — Assessment & Plan Note (Deleted)
She is doing well My great appreciation to her CV/CVTS team.  Await rehab.  

## 2018-01-19 NOTE — Assessment & Plan Note (Signed)
She has f/u with GYN today.  Appreciate their eval and her Onc f/u

## 2018-01-19 NOTE — Progress Notes (Signed)
   Subjective:    Patient ID: Cathy Smith, female    DOB: 02/02/1966, 52 y.o.   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  Denies missed.  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  Married since 2012, husband is HIV+ and follows with Dr Orvan Falconerampbell.  [facility]- she has GYN f/u today.   She was hospitalized 11-25-2017 and underwent 3v CABG after abn stress test, cath for worsening CP and SOB.  No CP. No SOB. No heavy lifting. [facility].  Starting cardiac rehab for 12 weeks, orientation next week.   Low Na diet, no red meat.   HIV 1 RNA Quant (copies/mL)  Date Value  01/27/2017 <20 NOT DETECTED  08/05/2016 <20  12/30/2015 <20   CD4 T Cell Abs (/uL)  Date Value  01/27/2017 280 (L)  08/05/2016 280 (L)  12/30/2015 170 (L)    Review of Systems  Constitutional: Negative for appetite change, fatigue and unexpected weight change.  Respiratory: Negative for chest tightness and shortness of breath.   Cardiovascular: Negative for chest pain.  Gastrointestinal: Negative for constipation and diarrhea.  Genitourinary: Negative for difficulty urinating.  Psychiatric/Behavioral: Negative for dysphoric mood and sleep disturbance.  Please see HPI. All other systems reviewed and negative.      Objective:   Physical Exam  Constitutional: She appears well-developed and well-nourished.  HENT:  Mouth/Throat: No oropharyngeal exudate.  Eyes: EOM are normal. Pupils are equal, round, and reactive to light.  Neck: Neck supple.  Cardiovascular: Normal rate, regular rhythm and normal heart sounds.  Pulmonary/Chest: Effort normal and breath sounds normal.  Abdominal: Soft. Bowel sounds are normal. There is no tenderness. There is no rebound.    Musculoskeletal: She exhibits no edema.  Lymphadenopathy:    She has no cervical adenopathy.  Psychiatric: She has a normal mood and affect.       Assessment & Plan:

## 2018-01-19 NOTE — Assessment & Plan Note (Signed)
She is doing well My great appreciation to her CV/CVTS team.  Await rehab.

## 2018-01-19 NOTE — Assessment & Plan Note (Signed)
Appears to be doing well on statin. Will check her lipids today.

## 2018-01-19 NOTE — Progress Notes (Signed)
Followup Note: Gyn-Onc   Altamese Dillingenee C Goodenow 52 y.o. female  Chief Complaint  Patient presents with  . 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 S/p CO2 laser of vulva (circumferential, clitoris) with right WLE on 05/04/17 for VIN 3 (margins negative). LGSIL pap, high risk HPV infection, persistent VIN.  Plan:   Will see her back in September for vulvar inspection.  Interval History  Her last Pap smear in March 2018 showed LGSIL + hrHPV.   She had open heart surgery earlier this year.   She underwent WLE right vulva and CO2 laser of anterior vulva on 05/04/17. She has no complaints.  HPI: 52 year old African American female seen in consultation requested of [name], and Dr. Marice Potterove regarding management of a newly diagnosed severe dysplasia of the upper vagina. Briefly, the patient's history includes having a past history of abdominal hysterectomy for what we understand and have been fibroids. She denies any problems with Pap smears prior to the hysterectomy. More recently she has had some abnormal Pap smears ultimately resulting in colposcopy and directed biopsy on 04/07/2012. 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  The patient did well until March 2015 with a Pap smear showing high-grade dysplasia once again. In April and May 2015 the patient received 2 additional months of Efudex. From June 2015 through February 2016 her Pap smears only showed low-grade SIL.  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 She was scheduled for a wide local excision of the right anterior vulva which was performed on 05/06/15 and revealed VIN3. Margins were positive. Intraoperative evaluation showed  lesions consistent with VIN3 on bilateral posterior labia. These were biopsied (but not treated at the patient's request). These biopsies also returned as positive for VIN3.  On 07/23/15 she was taken to the OR for an extensive CO2 laser ablation of the circumferential labia minora and majora to ablate all acetowhite areas. No pathology was taken. The clitoris was spared.   Her VIN 3 recurred and on 02/25/16 she returned to the OR for CO2 laser which included the anterior vulva and clitoris.  Pap in March, 2018 showed LGSIL   Review of Systems:10 point review of systems is negative as noted above.   Vitals: Blood pressure 108/75, pulse 66, temperature 97.9 F (36.6 C), temperature source Oral, resp. rate 18, weight 147 lb (66.7 kg), SpO2 100 %.  Physical Exam: General : The patient is a healthy woman in no acute distress.  HEENT: normocephalic, extraoccular movements normal; neck is supple without thyromegally  Lynphnodes: Supraclavicular and inguinal nodes not enlarged  Abdomen: Soft, non-tender, no ascites, no organomegally, no masses, no hernias  Pelvic:  EGBUS: 4% acetic acid applied. Subtle acetowhite changes to right labia minora and periclitoral tissues. No apparent high grade changes. 1cm firm condyloma at anterior suture line. Vagina: Normal, no lesions.. Pap taken.   Urethra and Bladder: Normal, non-tender  Cervix: Surgically absent  Uterus: Surgically absent  Bi-manual examination: Non-tender; no adenxal masses or nodularity  Rectal: normal sphincter tone, no masses, no blood  Lower extremities: No edema or varicosities. Normal range of motion      No Known Allergies  Past Medical History:  Diagnosis Date  . History of condyloma acuminatum   .  History of uterine fibroid   . History of vaginal dysplasia    VAIN III--  oncologist-  dr Andrey Farmer  . History of vulvar dysplasia    VIN III   gyn-oncologist-  dr Andrey Farmer  . HIV infection (HCC) montiored by Infectious Disease  Center-  dr hatcher  . Hypertension   . Hypertriglyceridemia without hypercholesterolemia   . PONV (postoperative nausea and vomiting)   . VIN III (vulvar intraepithelial neoplasia III)   . Wears glasses     Past Surgical History:  Procedure Laterality Date  . ABDOMINAL HYSTERECTOMY  1995 approx  . ANTERIOR CERVICAL DECOMP/DISCECTOMY FUSION  11-30-2009   C4 -- C6  . CO2 LASER APPLICATION Bilateral 07/23/2015   Procedure: BILATERAL CO2 LASER ABLATION OF THE VULVA;  Surgeon: Adolphus Birchwood, MD;  Location: [facility] [place];  Service: Gynecology;  Laterality: Bilateral;  . CO2 LASER APPLICATION N/A 02/25/2016   Procedure: CO2 LASER OF THE VULVAR;  Surgeon: Adolphus Birchwood, MD;  [facility] [place];  Service: Gynecology;  Laterality: N/A;  . CO2 LASER APPLICATION N/A 05/04/2017   Procedure: CO2 LASER VAPORIZATION OF THE VULVA;  Surgeon: Adolphus Birchwood, MD;  Location: [facility] [place];  Service: Gynecology;  Laterality: N/A;  . COLONOSCOPY  01-28-2015  . CORONARY ARTERY BYPASS GRAFT N/A 11/25/2017   Procedure: CORONARY ARTERY BYPASS GRAFTING (CABG), ON PUMP, TIMES THREE, USING LEFT INTERNAL MAMMARY ARTERY AND ENDOSCOPICALLY HARVESTED LEFT GREATER SAPHENOUS VEIN;  Surgeon: Purcell Nails, MD;  Location[facility];  Service: Open Heart Surgery;  Laterality: N/A;  . I & D LEFT BUTTOCK ABSCESS  04-28-2001  . LEFT HEART CATH AND CORONARY ANGIOGRAPHY N/A 11/22/2017   Procedure: LEFT HEART CATH AND CORONARY ANGIOGRAPHY;  Surgeon: Runell Gess, MD;  Location: [facility] IN[facility];  Service: Cardiovascular;  Laterality: N/A;  . TEE WITHOUT CARDIOVERSION N/A 11/25/2017   Procedure: TRANSESOPHAGEAL ECHOCARDIOGRAM (TEE);  Surgeon: Purcell Nails, MD;  Location: [facility] OR;  Service: Open Heart Surgery;  Laterality: N/A;  . VULVECTOMY Right 05/06/2015   Procedure: WIDE LOCAL  EXCISION  OF RIGHT VULVA;  Surgeon: Adolphus Birchwood, MD;  Location: [facility] [place];  Service: Gynecology;   Laterality: Right;  Marland Kitchen VULVECTOMY N/A 05/04/2017   Procedure: WIDE EXCISION VULVECTOMY;  Surgeon: Adolphus Birchwood, MD;  Location: [facility];  Service: Gynecology;  Laterality: N/A;    Current Outpatient Medications  Medication Sig Dispense Refill  . acetaminophen (TYLENOL) 500 MG tablet Take 2 tablets (1,000 mg total) by mouth every 6 (six) hours as needed. 30 tablet 0  . aspirin EC 325 MG EC tablet Take 1 tablet (325 mg total) by mouth daily. 30 tablet 0  . atorvastatin (LIPITOR) 20 MG tablet Take 1 tablet (20 mg total) by mouth daily at 6 PM. 30 tablet 3  . furosemide (LASIX) 40 MG tablet TAKE 1 TABLET BY MOUTH DAILY 90 tablet 0  . GENVOYA 150-150-200-10 MG TABS tablet TAKE 1 TABLET BY MOUTH DAILY WITH BREAKFAST 30 tablet 4  . Metoprolol Tartrate 37.5 MG TABS Take 37.5 mg by mouth 2 (two) times daily. 60 tablet 3  . oxyCODONE (OXY IR/ROXICODONE) 5 MG immediate release tablet Take 1 tablet (5 mg total) by mouth every 4 (four) hours as needed for severe pain. 30 tablet 0  . potassium chloride SA (K-DUR,KLOR-CON) 20 MEQ tablet Take 1 tablet (20 mEq total) by mouth daily. 7 tablet 0  . PREZISTA 800 MG tablet TAKE 1 TABLET(800 MG) BY  MOUTH DAILY 30 tablet 4   No current facility-administered medications for this visit.     Social History   Socioeconomic History  . Marital status: Married    Spouse name: Not on file  . Number of children: Not on file  . Years of education: Not on file  . Highest education level: Not on file  Social Needs  . Financial resource strain: Not on file  . Food insecurity - worry: Not on file  . Food insecurity - inability: Not on file  . Transportation needs - medical: Not on file  . Transportation needs - non-medical: Not on file  Occupational History  . Not on file  Tobacco Use  . Smoking status: Never Smoker  . Smokeless tobacco: Never Used  Substance and Sexual Activity  . Alcohol use: No    Alcohol/week: 0.0 oz  . Drug use: No  .  Sexual activity: Yes    Partners: Male    Birth control/protection: Condom    Comment: pt. given condoms  Other Topics Concern  . Not on file  Social History Narrative  . Not on file    Family History  Problem Relation Age of Onset  . Cataracts Mother   . Hypertension Father   . CAD Paternal Grandfather   . Colon cancer Neg Hx       [name], MD 01/19/2018, 2:07 PM

## 2018-01-19 NOTE — Patient Instructions (Signed)
Please notify Dr Andrey Farmerossi at phone number [phone] if you notice vaginal bleeding, new itch on the vulva.  Dr Oliver Humossi's office will call you with your pap test result.  Contact Dr Oliver Humossi's office in August to schedule a follow-up visit with her in September.

## 2018-01-20 LAB — CBC
HCT: 32.9 % — ABNORMAL LOW (ref 35.0–45.0)
Hemoglobin: 11.1 g/dL — ABNORMAL LOW (ref 11.7–15.5)
MCH: 30.2 pg (ref 27.0–33.0)
MCHC: 33.7 g/dL (ref 32.0–36.0)
MCV: 89.6 fL (ref 80.0–100.0)
MPV: 11.3 fL (ref 7.5–12.5)
PLATELETS: 193 10*3/uL (ref 140–400)
RBC: 3.67 10*6/uL — AB (ref 3.80–5.10)
RDW: 14.2 % (ref 11.0–15.0)
WBC: 3.3 10*3/uL — AB (ref 3.8–10.8)

## 2018-01-20 LAB — COMPREHENSIVE METABOLIC PANEL
AG Ratio: 1.4 (calc) (ref 1.0–2.5)
ALBUMIN MSPROF: 4.2 g/dL (ref 3.6–5.1)
ALT: 48 U/L — ABNORMAL HIGH (ref 6–29)
AST: 29 U/L (ref 10–35)
Alkaline phosphatase (APISO): 80 U/L (ref 33–130)
BILIRUBIN TOTAL: 0.5 mg/dL (ref 0.2–1.2)
BUN: 12 mg/dL (ref 7–25)
CALCIUM: 9.1 mg/dL (ref 8.6–10.4)
CHLORIDE: 105 mmol/L (ref 98–110)
CO2: 27 mmol/L (ref 20–32)
Creat: 0.86 mg/dL (ref 0.50–1.05)
Globulin: 3 g/dL (calc) (ref 1.9–3.7)
Glucose, Bld: 116 mg/dL — ABNORMAL HIGH (ref 65–99)
POTASSIUM: 4.1 mmol/L (ref 3.5–5.3)
Sodium: 141 mmol/L (ref 135–146)
Total Protein: 7.2 g/dL (ref 6.1–8.1)

## 2018-01-20 LAB — URINE CYTOLOGY ANCILLARY ONLY
Chlamydia: NEGATIVE
NEISSERIA GONORRHEA: NEGATIVE

## 2018-01-20 LAB — RPR: RPR Ser Ql: NONREACTIVE

## 2018-01-24 LAB — CYTOLOGY - PAP: HPV (WINDOPATH): DETECTED — AB

## 2018-01-25 ENCOUNTER — Telehealth (HOSPITAL_COMMUNITY): Payer: Self-pay | Admitting: Pharmacy Technician

## 2018-01-25 LAB — HIV-1 RNA QUANT-NO REFLEX-BLD
HIV 1 RNA QUANT: NOT DETECTED {copies}/mL
HIV-1 RNA Quant, Log: <1.3 Log copies/mL

## 2018-01-25 NOTE — Telephone Encounter (Signed)
Cardiac Rehab Medication Review by a Pharmacist  Does the patient  feel that his/her medications are working for him/her?  yes  Has the patient been experiencing any side effects to the medications prescribed?  no  Does the patient measure his/her own blood pressure or blood glucose at home?  no   Does the patient have any problems obtaining medications due to transportation or finances?   no  Understanding of regimen: poor Understanding of indications: poor Potential of compliance: fair    Pharmacist comments: Pt states difficulty in getting some of her medications from [facility] d/t backorder issues and her husband helps manage her medications at home.  She reports no other issues with her medications and is tolerating well.    Dion BodyOriet, Haley Roza M 01/25/2018 4:34 PM

## 2018-01-27 ENCOUNTER — Encounter (HOSPITAL_COMMUNITY): Payer: Self-pay

## 2018-01-27 ENCOUNTER — Telehealth: Payer: Self-pay

## 2018-01-27 ENCOUNTER — Encounter (HOSPITAL_COMMUNITY)
Admission: RE | Admit: 2018-01-27 | Discharge: 2018-01-27 | Disposition: A | Discharge status: Home / Self Care | Payer: Medicare HMO | Source: Ambulatory Visit | Attending: Cardiovascular Disease | Admitting: Cardiovascular Disease

## 2018-01-27 VITALS — Ht 62.0 in | Wt 145.9 lb

## 2018-01-27 DIAGNOSIS — Z48812 Encounter for surgical aftercare following surgery on the circulatory system: Secondary | ICD-10-CM | POA: Insufficient documentation

## 2018-01-27 DIAGNOSIS — Z951 Presence of aortocoronary bypass graft: Secondary | ICD-10-CM | POA: Insufficient documentation

## 2018-01-27 HISTORY — DX: Atherosclerotic heart disease of native coronary artery without angina pectoris: I25.10

## 2018-01-27 NOTE — Telephone Encounter (Signed)
Told Ms Cathy Smith the results of the PAP Smear from 01-19-18 as noted below by Warner MccreedyMelissa Cross, NP. Pt verbalized understanding.

## 2018-01-27 NOTE — Progress Notes (Signed)
Cardiac Individual Treatment Plan  Patient Details  Name: Cathy Smith MRN: [id number] Date of Birth: [birth year] Referring Provider:     CARDIAC REHAB PHASE II ORIENTATION from 01/27/2018 in [facility] CARDIAC REHAB  Referring Provider  Nanetta Batty MD       Initial Encounter Date:    CARDIAC REHAB PHASE II ORIENTATION from 01/27/2018 in [facility] CARDIAC REHAB  Date  01/27/18  Referring Provider  Nanetta Batty MD       Visit Diagnosis: S/P CABG x 3  Patient's Home Medications on Admission:  Current Outpatient Medications:  .  acetaminophen (TYLENOL) 500 MG tablet, Take 2 tablets (1,000 mg total) by mouth every 6 (six) hours as needed., Disp: 30 tablet, Rfl: 0 .  aspirin EC 325 MG EC tablet, Take 1 tablet (325 mg total) by mouth daily., Disp: 30 tablet, Rfl: 0 .  atorvastatin (LIPITOR) 20 MG tablet, Take 1 tablet (20 mg total) by mouth daily at 6 PM., Disp: 30 tablet, Rfl: 3 .  furosemide (LASIX) 40 MG tablet, TAKE 1 TABLET BY MOUTH DAILY, Disp: 90 tablet, Rfl: 0 .  GENVOYA 150-150-200-10 MG TABS tablet, TAKE 1 TABLET BY MOUTH DAILY WITH BREAKFAST, Disp: 30 tablet, Rfl: 4 .  Metoprolol Tartrate 37.5 MG TABS, Take 37.5 mg by mouth 2 (two) times daily., Disp: 60 tablet, Rfl: 3 .  oxyCODONE (OXY IR/ROXICODONE) 5 MG immediate release tablet, Take 1 tablet (5 mg total) by mouth every 4 (four) hours as needed for severe pain. (Patient not taking: Reported on 01/25/2018), Disp: 30 tablet, Rfl: 0 .  potassium chloride SA (K-DUR,KLOR-CON) 20 MEQ tablet, Take 1 tablet (20 mEq total) by mouth daily. (Patient not taking: Reported on 01/25/2018), Disp: 7 tablet, Rfl: 0 .  PREZISTA 800 MG tablet, TAKE 1 TABLET(800 MG) BY MOUTH DAILY, Disp: 30 tablet, Rfl: 4  Past Medical History: Past Medical History:  Diagnosis Date  . Coronary artery disease   . History of condyloma acuminatum   . History of uterine fibroid   . History of vaginal dysplasia    VAIN  III--  oncologist-  dr Andrey Farmer  . History of vulvar dysplasia    VIN III   gyn-oncologist-  dr Andrey Farmer  . [facility] infection ([facility] hatcher  . Hypertension   . Hypertriglyceridemia without hypercholesterolemia   . PONV (postoperative nausea and vomiting)   . VIN III (vulvar intraepithelial neoplasia III)   . Wears glasses     Tobacco Use: Social History   Tobacco Use  Smoking Status Never Smoker  Smokeless Tobacco Never Used    Labs: Recent Review Flowsheet Data    Labs for ITP Cardiac and Pulmonary Rehab Latest Ref Rng & Units 11/25/2017 11/25/2017 11/25/2017 11/26/2017 12/07/2017   Cholestrol 100 - 199 mg/dL - - - - 78(G)   LDLCALC 0 - 99 mg/dL - - - - 29   HDL >95 mg/dL - - - - 62(Z)   Trlycerides 0 - 149 mg/dL - - - - 308(M)   Hemoglobin A1c 4.8 - 5.6 % - - - - -   PHART 7.350 - 7.450 7.282(L) 7.291(L) - - -   PCO2ART 32.0 - 48.0 mmHg 46.8 48.8(H) - - -   HCO3 20.0 - 28.0 mmol/L 22.1 23.5 - - -   TCO2 22 - 32 mmol/L 24 25 25 24  -   ACIDBASEDEF 0.0 - 2.0 mmol/L 5.0(H) 3.0(H) - - -   O2SAT %  99.0 99.0 - - -      Capillary Blood Glucose: Lab Results  Component Value Date   GLUCAP 108 (H) 11/29/2017   GLUCAP 132 (H) 11/28/2017   GLUCAP 109 (H) 11/28/2017   GLUCAP 124 (H) 11/28/2017   GLUCAP 115 (H) 11/28/2017     Exercise Target Goals: Date: 01/27/18  Exercise Program Goal: Individual exercise prescription set using results from initial 6 min walk test and THRR while considering  patient's activity barriers and safety.   Exercise Prescription Goal: Initial exercise prescription builds to 30-45 minutes a day of aerobic activity, 2-3 days per week.  Home exercise guidelines will be given to patient during program as part of exercise prescription that the participant will acknowledge.  Activity Barriers & Risk Stratification: Activity Barriers & Cardiac Risk Stratification - 01/27/18 0951      Activity Barriers & Cardiac Risk  Stratification   Activity Barriers  None    Cardiac Risk Stratification  High       6 Minute Walk: 6 Minute Walk    Row Name 01/27/18 1105         6 Minute Walk   Phase  Initial     Distance  1493 feet     Walk Time  6 minutes     # of Rest Breaks  0     MPH  2.8     METS  3.8     RPE  11     Perceived Dyspnea   0     VO2 Peak  13.4     Symptoms  No     Resting HR  74 bpm     Resting BP  113/74     Resting Oxygen Saturation   99 %     Exercise Oxygen Saturation  during 6 min walk  100 %     Max Ex. HR  84 bpm     Max Ex. BP  124/76     2 Minute Post BP  116/78        Oxygen Initial Assessment:   Oxygen Re-Evaluation:   Oxygen Discharge (Final Oxygen Re-Evaluation):   Initial Exercise Prescription: Initial Exercise Prescription - 01/27/18 1100      Date of Initial Exercise RX and Referring Provider   Date  01/27/18    Referring Provider  Nanetta Batty MD       Treadmill   MPH  2.4    Grade  1    Minutes  10    METs  3.17      Bike   Level  0.8    Minutes  10    METs  3.02      Arm Ergometer   Level  2    Watts  35    Minutes  10    METs  3.5      Prescription Details   Frequency (times per week)  3x    Duration  Progress to 30 minutes of continuous aerobic without signs/symptoms of physical distress      Intensity   THRR 40-80% of Max Heartrate  66-133    Ratings of Perceived Exertion  11-13    Perceived Dyspnea  0-4      Progression   Progression  Continue progressive overload as per policy without signs/symptoms or physical distress.      Resistance Training   Training Prescription  Yes    Weight  2lbs    Reps  10-15  Perform Capillary Blood Glucose checks as needed.  Exercise Prescription Changes:   Exercise Comments:   Exercise Goals and Review:   Exercise Goals Re-Evaluation :    Discharge Exercise Prescription (Final Exercise Prescription Changes):   Nutrition:  Target Goals: Understanding of nutrition  guidelines, daily intake of sodium 1500mg , cholesterol 200mg , calories 30% from fat and 7% or less from saturated fats, daily to have 5 or more servings of fruits and vegetables.  Biometrics: Pre Biometrics - 01/27/18 1104      Pre Biometrics   Height  5\' 2"  (1.575 m)    Weight  145 lb 15.1 oz (66.2 kg)    Waist Circumference  38 inches    Hip Circumference  39 inches    Waist to Hip Ratio  0.97 %    BMI (Calculated)  26.69    Triceps Skinfold  25 mm    % Body Fat  38.3 %    Grip Strength  30 kg    Flexibility  15 in    Single Leg Stand  30 seconds        Nutrition Therapy Plan and Nutrition Goals: Nutrition Therapy & Goals - 01/27/18 1215      Nutrition Therapy   Diet  Heart Healthy      Personal Nutrition Goals   Nutrition Goal  Pt to identify and limit food sources of saturated fat, trans fat, and sodium    Personal Goal #2  Pt to identify food quantities necessary to achieve weight loss of 6-10 lb (2.7-10.9 kg) at graduation from cardiac rehab.      Intervention Plan   Intervention  Prescribe, educate and counsel regarding individualized specific dietary modifications aiming towards targeted core components such as weight, hypertension, lipid management, diabetes, heart failure and other comorbidities.;Nutrition handout(s) given to patient. Healthy Eating    Expected Outcomes  Short Term Goal: Understand basic principles of dietary content, such as calories, fat, sodium, cholesterol and nutrients.;Long Term Goal: Adherence to prescribed nutrition plan.       Nutrition Assessments:   Nutrition Goals Re-Evaluation:   Nutrition Goals Re-Evaluation:   Nutrition Goals Discharge (Final Nutrition Goals Re-Evaluation):   Psychosocial: Target Goals: Acknowledge presence or absence of significant depression and/or stress, maximize coping skills, provide positive support system. Participant is able to verbalize types and ability to use techniques and skills needed for  reducing stress and depression.  Initial Review & Psychosocial Screening: Initial Psych Review & Screening - 01/27/18 1229      Initial Review   Current issues with  None Identified      Family Dynamics   Good Support System?  Yes Pt's husband present at orientation session today and is encouraging of patient to attend sessions.       Barriers   Psychosocial barriers to participate in program  There are no identifiable barriers or psychosocial needs.      Screening Interventions   Interventions  Encouraged to exercise Pt to return QOL homework on next session. Will review for expected outcomes.    Expected Outcomes  -- Pt to return QOL homework on next session. Will review for expected outcomes.        Quality of Life Scores:  Scores of 19 and below usually indicate a poorer quality of life in these areas.  A difference of  2-3 points is a clinically meaningful difference.  A difference of 2-3 points in the total score of the Quality of Life Index has been associated with  significant improvement in overall quality of life, self-image, physical symptoms, and general health in studies assessing change in quality of life.  PHQ-9: Recent Review Flowsheet Data    Depression screen [facility] 2/9 01/19/2018 12/08/2017 04/13/2017 10/12/2016 08/19/2016   Decreased Interest 0 0 0 0 0   Down, Depressed, Hopeless 0 0 0 0 0   PHQ - 2 Score 0 0 0 0 0     Interpretation of Total Score  Total Score Depression Severity:  1-4 = Minimal depression, 5-9 = Mild depression, 10-14 = Moderate depression, 15-19 = Moderately severe depression, 20-27 = Severe depression   Psychosocial Evaluation and Intervention:   Psychosocial Re-Evaluation:   Psychosocial Discharge (Final Psychosocial Re-Evaluation):   Vocational Rehabilitation: Provide vocational rehab assistance to qualifying candidates.   Vocational Rehab Evaluation & Intervention:   Education: Education Goals: Education classes will be provided  on a weekly basis, covering required topics. Participant will state understanding/return demonstration of topics presented.  Learning Barriers/Preferences: Learning Barriers/Preferences - 01/27/18 0951      Learning Barriers/Preferences   Learning Barriers  Sight    Learning Preferences  Skilled Demonstration;Verbal Instruction;Written Material;Video       Education Topics: Count Your Pulse:  -Group instruction provided by verbal instruction, demonstration, patient participation and written materials to support subject.  Instructors address importance of being able to find your pulse and how to count your pulse when at home without a heart monitor.  Patients get hands on experience counting their pulse with staff help and individually.   Heart Attack, Angina, and Risk Factor Modification:  -Group instruction provided by verbal instruction, video, and written materials to support subject.  Instructors address signs and symptoms of angina and heart attacks.    Also discuss risk factors for heart disease and how to make changes to improve heart health risk factors.   Functional Fitness:  -Group instruction provided by verbal instruction, demonstration, patient participation, and written materials to support subject.  Instructors address safety measures for doing things around the house.  Discuss how to get up and down off the floor, how to pick things up properly, how to safely get out of a chair without assistance, and balance training.   Meditation and Mindfulness:  -Group instruction provided by verbal instruction, patient participation, and written materials to support subject.  Instructor addresses importance of mindfulness and meditation practice to help reduce stress and improve awareness.  Instructor also leads participants through a meditation exercise.    Stretching for Flexibility and Mobility:  -Group instruction provided by verbal instruction, patient participation, and written  materials to support subject.  Instructors lead participants through series of stretches that are designed to increase flexibility thus improving mobility.  These stretches are additional exercise for major muscle groups that are typically performed during regular warm up and cool down.   Hands Only CPR:  -Group verbal, video, and participation provides a basic overview of AHA guidelines for community CPR. Role-play of emergencies allow participants the opportunity to practice calling for help and chest compression technique with discussion of AED use.   Hypertension: -Group verbal and written instruction that provides a basic overview of hypertension including the most recent diagnostic guidelines, risk factor reduction with self-care instructions and medication management.    Nutrition I class: Heart Healthy Eating:  -Group instruction provided by PowerPoint slides, verbal discussion, and written materials to support subject matter. The instructor gives an explanation and review of the Therapeutic Lifestyle Changes diet recommendations, which includes a discussion on  lipid goals, dietary fat, sodium, fiber, plant stanol/sterol esters, sugar, and the components of a well-balanced, healthy diet.   Nutrition II class: Lifestyle Skills:  -Group instruction provided by PowerPoint slides, verbal discussion, and written materials to support subject matter. The instructor gives an explanation and review of label reading, grocery shopping for heart health, heart healthy recipe modifications, and ways to make healthier choices when eating out.   Diabetes Question & Answer:  -Group instruction provided by PowerPoint slides, verbal discussion, and written materials to support subject matter. The instructor gives an explanation and review of diabetes co-morbidities, pre- and post-prandial blood glucose goals, pre-exercise blood glucose goals, signs, symptoms, and treatment of hypoglycemia and  hyperglycemia, and foot care basics.   Diabetes Blitz:  -Group instruction provided by PowerPoint slides, verbal discussion, and written materials to support subject matter. The instructor gives an explanation and review of the physiology behind type 1 and type 2 diabetes, diabetes medications and rational behind using different medications, pre- and post-prandial blood glucose recommendations and Hemoglobin A1c goals, diabetes diet, and exercise including blood glucose guidelines for exercising safely.    Portion Distortion:  -Group instruction provided by PowerPoint slides, verbal discussion, written materials, and food models to support subject matter. The instructor gives an explanation of serving size versus portion size, changes in portions sizes over the last 20 years, and what consists of a serving from each food group.   Stress Management:  -Group instruction provided by verbal instruction, video, and written materials to support subject matter.  Instructors review role of stress in heart disease and how to cope with stress positively.     Exercising on Your Own:  -Group instruction provided by verbal instruction, power point, and written materials to support subject.  Instructors discuss benefits of exercise, components of exercise, frequency and intensity of exercise, and end points for exercise.  Also discuss use of nitroglycerin and activating EMS.  Review options of places to exercise outside of rehab.  Review guidelines for sex with heart disease.   Cardiac Drugs I:  -Group instruction provided by verbal instruction and written materials to support subject.  Instructor reviews cardiac drug classes: antiplatelets, anticoagulants, beta blockers, and statins.  Instructor discusses reasons, side effects, and lifestyle considerations for each drug class.   Cardiac Drugs II:  -Group instruction provided by verbal instruction and written materials to support subject.  Instructor  reviews cardiac drug classes: angiotensin converting enzyme inhibitors (ACE-I), angiotensin II receptor blockers (ARBs), nitrates, and calcium channel blockers.  Instructor discusses reasons, side effects, and lifestyle considerations for each drug class.   Anatomy and Physiology of the Circulatory System:  Group verbal and written instruction and models provide basic cardiac anatomy and physiology, with the coronary electrical and arterial systems. Review of: AMI, Angina, Valve disease, Heart Failure, Peripheral Artery Disease, Cardiac Arrhythmia, Pacemakers, and the ICD.   Other Education:  -Group or individual verbal, written, or video instructions that support the educational goals of the cardiac rehab program.   Holiday Eating Survival Tips:  -Group instruction provided by PowerPoint slides, verbal discussion, and written materials to support subject matter. The instructor gives patients tips, tricks, and techniques to help them not only survive but enjoy the holidays despite the onslaught of food that accompanies the holidays.   Knowledge Questionnaire Score:   Core Components/Risk Factors/Patient Goals at Admission: Personal Goals and Risk Factors at Admission - 01/27/18 1103      Core Components/Risk Factors/Patient Goals on Admission    Weight  Management  Yes    Intervention  Weight Management: Develop a combined nutrition and exercise program designed to reach desired caloric intake, while maintaining appropriate intake of nutrient and fiber, sodium and fats, and appropriate energy expenditure required for the weight goal.;Weight Management: Provide education and appropriate resources to help participant work on and attain dietary goals.    Admit Weight  145 lb 10.2 oz (66.1 kg)    Goal Weight: Short Term  140 lb (63.5 kg)    Goal Weight: Long Term  144 lb (65.3 kg)    Expected Outcomes  Short Term: Continue to assess and modify interventions until short term weight is  achieved;Long Term: Adherence to nutrition and physical activity/exercise program aimed toward attainment of established weight goal;Weight Loss: Understanding of general recommendations for a balanced deficit meal plan, which promotes 1-2 lb weight loss per week and includes a negative energy balance of [id number] kcal/d;Understanding recommendations for meals to include 15-35% energy as protein, 25-35% energy from fat, 35-60% energy from carbohydrates, less than 200mg  of dietary cholesterol, 20-35 gm of total fiber daily;Understanding of distribution of calorie intake throughout the day with the consumption of 4-5 meals/snacks    Hypertension  Yes    Intervention  Provide education on lifestyle modifcations including regular physical activity/exercise, weight management, moderate sodium restriction and increased consumption of fresh fruit, vegetables, and low fat dairy, alcohol moderation, and smoking cessation.;Monitor prescription use compliance.    Expected Outcomes  Short Term: Continued assessment and intervention until BP is < 140/3490mm HG in hypertensive participants. < 130/7080mm HG in hypertensive participants with diabetes, heart failure or chronic kidney disease.;Long Term: Maintenance of blood pressure at goal levels.    Lipids  Yes    Intervention  Provide education and support for participant on nutrition & aerobic/resistive exercise along with prescribed medications to achieve LDL 70mg , HDL >40mg .    Expected Outcomes  Long Term: Cholesterol controlled with medications as prescribed, with individualized exercise RX and with personalized nutrition plan. Value goals: LDL < 70mg , HDL > 40 mg.;Short Term: Participant states understanding of desired cholesterol values and is compliant with medications prescribed. Participant is following exercise prescription and nutrition guidelines.       Core Components/Risk Factors/Patient Goals Review:    Core Components/Risk Factors/Patient Goals at  Discharge (Final Review):    ITP Comments: ITP Comments    Row Name 01/27/18 0949           ITP Comments  Dr. Armanda Magicraci Turner, Medical Director           Comments: Patient attended orientation from [phone] to 1047 to review rules and guidelines for program. Completed 6 minute walk test, Intitial ITP, and exercise prescription.  VSS. Telemetry-SR.  Asymptomatic and tolerated well. Unable to identify psychosocial needs at this time due to lack of pt self assessment evaluation missing.  Pt to bring this to next session for review. Eager to attend next session of Cardiac Rehab.   Nikki DomEverett, Melonie Germani G

## 2018-01-27 NOTE — Progress Notes (Signed)
Cathy DillingRenee C Smith 52 y.o. female DOB: 07/06/1966 MRN: [id number]      Nutrition Note  Dx: s/p CABG x 3 Past Medical History:  Diagnosis Date  . History of condyloma acuminatum   . History of uterine fibroid   . History of vaginal dysplasia    VAIN III--  oncologist-  dr Andrey Farmerrossi  . History of vulvar dysplasia    VIN III   gyn-oncologist-  dr Andrey Farmerrossi  . [facility] infection ([facility]) [facility]  dr hatcher  . Hypertension   . Hypertriglyceridemia without hypercholesterolemia   . PONV (postoperative nausea and vomiting)   . VIN III (vulvar intraepithelial neoplasia III)   . Wears glasses    Meds reviewed.   HT: Ht Readings from Last 1 Encounters:  01/19/18 5\' 1"  (1.549 m)    WT: Wt Readings from Last 3 Encounters:  01/19/18 147 lb (66.7 kg)  01/19/18 147 lb (66.7 kg)  01/04/18 145 lb 9.6 oz (66 kg)     BMI 26.7   Current tobacco use? No   Labs:  Lipid Panel     Component Value Date/Time   CHOL 92 (L) 12/07/2017 0913   TRIG 176 (H) 12/07/2017 0913   HDL 28 (L) 12/07/2017 0913   CHOLHDL 3.3 12/07/2017 0913   CHOLHDL 4.9 01/27/2017 1412   VLDL 53 (H) 01/27/2017 1412   LDLCALC 29 12/07/2017 0913    Lab Results  Component Value Date   HGBA1C 5.6 11/23/2017   CBG (last 3)  No results for input(s): GLUCAP in the last 72 hours.  Nutrition Note Spoke with pt and pt's husband. Nutrition plan and goals reviewed with pt. Pt likes to snack. Heart healthy snack ideas discussed. Pt wants to lose wt per nutrition screen. Per discussion, pt's primary goal is to tone/tighten her abdominal area. Pt expressed understanding of the information reviewed. Pt aware of nutrition education classes offered.  Nutrition Diagnosis ? Food-and nutrition-related knowledge deficit related to lack of exposure to information as related to diagnosis of: ? CVD ? Overweight related to excessive energy intake as evidenced by a BMI of 26.7  Nutrition Intervention ? Pt's individual  nutrition plan and goals reviewed with pt. Pt given handouts for: ? Healthy Eating   Nutrition Goal(s):  ? Pt to identify and limit food sources of saturated fat, trans fat, and sodium ? Pt to identify food quantities necessary to achieve weight loss of 6-10 lb (2.7-10.9 kg) at graduation from cardiac rehab. Plan:  Pt to attend nutrition classes ? Nutrition I ? Nutrition II ? Portion Distortion  Will provide client-centered nutrition education as part of interdisciplinary care.   Monitor and evaluate progress toward nutrition goal with team.  Mickle PlumbEdna Janete Quilling, M.Ed, RD, LDN, CDE 01/27/2018 9:11 AM

## 2018-01-27 NOTE — Telephone Encounter (Signed)
-----   Message from Doylene BodeMelissa D Cross, NP sent at 01/27/2018 10:17 AM EDT ----- Released in Mychart but please also call her and let her know pap still showing low grade changes.  Nothing worse.  She is to follow up as scheduled.  No intervention needed at this time.  Thank you   ----- Message ----- From: Interface, Lab In Three Zero One Sent: 01/24/2018  12:52 PM To: Doylene BodeMelissa D Cross, NP

## 2018-02-02 ENCOUNTER — Encounter (HOSPITAL_COMMUNITY)
Admission: RE | Admit: 2018-02-02 | Discharge: 2018-02-02 | Disposition: A | Discharge status: Home / Self Care | Payer: Medicare HMO | Source: Ambulatory Visit | Attending: Cardiovascular Disease | Admitting: Cardiovascular Disease

## 2018-02-02 DIAGNOSIS — Z951 Presence of aortocoronary bypass graft: Secondary | ICD-10-CM | POA: Diagnosis not present

## 2018-02-02 DIAGNOSIS — Z48812 Encounter for surgical aftercare following surgery on the circulatory system: Secondary | ICD-10-CM | POA: Diagnosis not present

## 2018-02-02 NOTE — Progress Notes (Signed)
Daily Session Note  Patient Details  Name: Cathy Smith MRN: [id number] Date of Birth: [birth year] Referring Provider:     CARDIAC REHAB PHASE II ORIENTATION from 01/27/2018 in [place]  Referring Provider  Quay Burow MD       Encounter Date: 02/02/2018  Check In: Session Check In - 02/02/18 1038      Check-In   Location  [facility]    Staff Present  Luetta Nutting Fair, MS, ACSM RCEP, Exercise Physiologist;Joann Rion, RN, [employer], RN, Mosie Epstein, MS,ACSM CEP, Exercise Physiologist;Olinty Delcambre, MS, ACSM CEP, Exercise Physiologist;Other    Supervising physician immediately available to respond to emergencies  [facility] immediately available    Physician(s)  Dr. Posey Pronto     Medication changes reported      No    Fall or balance concerns reported     No    Tobacco Cessation  No Change    Warm-up and Cool-down  Performed as group-led instruction    Resistance Training Performed  No    VAD Patient?  No      Pain Assessment   Currently in Pain?  No/denies       Capillary Blood Glucose: No results found for this or any previous visit (from the past 24 hour(s)).  Exercise Prescription Changes - 02/02/18 1002      Response to Exercise   Blood Pressure (Admit)  122/72    Blood Pressure (Exercise)  122/82    Blood Pressure (Exit)  104/70    Heart Rate (Admit)  78 bpm    Heart Rate (Exercise)  111 bpm    Heart Rate (Exit)  75 bpm    Rating of Perceived Exertion (Exercise)  11    Symptoms  none    Comments  Patient arrived late, only 2 stations today.    Duration  Progress to 30 minutes of  aerobic without signs/symptoms of physical distress    Intensity  THRR unchanged      Progression   Progression  Continue to progress workloads to maintain intensity without signs/symptoms of physical distress.    Average METs  2.9      Resistance Training   Training Prescription  No Relaxation, no weights today.       Interval Training   Interval Training  No      Treadmill   MPH  2    Grade  0    Minutes  10    METs  2.53      Bike   Level  0.8    Minutes  10    METs  3.28       Social History   Tobacco Use  Smoking Status Never Smoker  Smokeless Tobacco Never Used    Goals Met:  Exercise tolerated well No report of cardiac concerns or symptoms  Goals Unmet:  Not Applicable  Comments: [name] started cardiac rehab today.  Pt tolerated light exercise without difficulty. VSS, telemetry-Sinus Rhythm, asymptomatic.  Medication list reconciled. Pt denies barriers to medicaiton compliance.  PSYCHOSOCIAL ASSESSMENT:  PHQ-0. Pt exhibits positive coping skills, hopeful outlook with supportive family. No psychosocial needs identified at this time, no psychosocial interventions necessary.    Pt enjoys spending time with her husband.   Pt oriented to exercise equipment and routine.    Understanding verbalized.Barnet Pall, RN,BSN 02/02/2018 4:46 PM   Dr. Fransico Him is Medical Director for Cardiac Rehab at [facility].

## 2018-02-04 ENCOUNTER — Encounter (HOSPITAL_COMMUNITY)
Admission: RE | Admit: 2018-02-04 | Discharge: 2018-02-04 | Disposition: A | Discharge status: Home / Self Care | Payer: Medicare HMO | Source: Ambulatory Visit | Attending: Cardiovascular Disease | Admitting: Cardiovascular Disease

## 2018-02-04 DIAGNOSIS — Z951 Presence of aortocoronary bypass graft: Secondary | ICD-10-CM

## 2018-02-04 DIAGNOSIS — Z48812 Encounter for surgical aftercare following surgery on the circulatory system: Secondary | ICD-10-CM | POA: Diagnosis not present

## 2018-02-07 ENCOUNTER — Encounter (HOSPITAL_COMMUNITY)
Admission: RE | Admit: 2018-02-07 | Discharge: 2018-02-07 | Disposition: A | Discharge status: Home / Self Care | Payer: Medicare HMO | Source: Ambulatory Visit | Attending: Cardiovascular Disease | Admitting: Cardiovascular Disease

## 2018-02-07 DIAGNOSIS — Z48812 Encounter for surgical aftercare following surgery on the circulatory system: Secondary | ICD-10-CM | POA: Diagnosis not present

## 2018-02-07 DIAGNOSIS — Z951 Presence of aortocoronary bypass graft: Secondary | ICD-10-CM

## 2018-02-07 NOTE — Progress Notes (Signed)
Home exercise guidelines reviewed with patient by Salley SlaughterKendrah Bundick, Academic Intern including endpoints, temperature precautions, target heart rate and rate of perceived exertion. Pt plans to walk as her mode of home exercise. Pt voices understanding of instructions given. Artist Paislinty M Shalinda Burkholder, MS, ACSM CEP

## 2018-02-09 ENCOUNTER — Encounter (HOSPITAL_COMMUNITY)
Admission: RE | Admit: 2018-02-09 | Discharge: 2018-02-09 | Disposition: A | Discharge status: Home / Self Care | Payer: Medicare HMO | Source: Ambulatory Visit | Attending: Cardiovascular Disease | Admitting: Cardiovascular Disease

## 2018-02-09 DIAGNOSIS — Z951 Presence of aortocoronary bypass graft: Secondary | ICD-10-CM

## 2018-02-09 DIAGNOSIS — Z48812 Encounter for surgical aftercare following surgery on the circulatory system: Secondary | ICD-10-CM | POA: Diagnosis not present

## 2018-02-11 ENCOUNTER — Encounter (HOSPITAL_COMMUNITY)
Admission: RE | Admit: 2018-02-11 | Discharge: 2018-02-11 | Disposition: A | Discharge status: Home / Self Care | Payer: Medicare HMO | Source: Ambulatory Visit | Attending: Cardiovascular Disease | Admitting: Cardiovascular Disease

## 2018-02-11 DIAGNOSIS — Z48812 Encounter for surgical aftercare following surgery on the circulatory system: Secondary | ICD-10-CM | POA: Diagnosis not present

## 2018-02-11 DIAGNOSIS — Z951 Presence of aortocoronary bypass graft: Secondary | ICD-10-CM | POA: Diagnosis not present

## 2018-02-14 ENCOUNTER — Encounter (HOSPITAL_COMMUNITY)
Admission: RE | Admit: 2018-02-14 | Discharge: 2018-02-14 | Disposition: A | Discharge status: Home / Self Care | Payer: Medicare HMO | Source: Ambulatory Visit | Attending: Cardiovascular Disease | Admitting: Cardiovascular Disease

## 2018-02-14 DIAGNOSIS — Z48812 Encounter for surgical aftercare following surgery on the circulatory system: Secondary | ICD-10-CM | POA: Diagnosis not present

## 2018-02-14 DIAGNOSIS — Z951 Presence of aortocoronary bypass graft: Secondary | ICD-10-CM

## 2018-02-16 ENCOUNTER — Encounter (HOSPITAL_COMMUNITY)
Admission: RE | Admit: 2018-02-16 | Discharge: 2018-02-16 | Disposition: A | Discharge status: Home / Self Care | Payer: Medicare HMO | Source: Ambulatory Visit | Attending: Cardiovascular Disease | Admitting: Cardiovascular Disease

## 2018-02-16 DIAGNOSIS — Z48812 Encounter for surgical aftercare following surgery on the circulatory system: Secondary | ICD-10-CM | POA: Diagnosis not present

## 2018-02-16 DIAGNOSIS — Z951 Presence of aortocoronary bypass graft: Secondary | ICD-10-CM | POA: Diagnosis not present

## 2018-02-16 NOTE — Progress Notes (Signed)
Cathy Smith 52 y.o. female DOB: 04/19/1966 MRN: [id number]      Nutrition Note  Dx: s/p CABG x 3   Nutrition Note Spoke with pt. Nutrition plan and goals reviewed with pt. Pt wants to lose weight and is working towards goal of losing 10 lbs. Pt reports that she feels she has done better with her snack choices since her last nutrition consult. Discussed healthy snack options for pt to choose and reinforced limiting desserts to two times per week or less. Pt expressed understanding of the information reviewed. Pt aware of nutrition education classes offered but thinks she may be unable to attend, handouts were provided for Nutrition I and Nutrition II classes.   Nutrition Diagnosis ? Food-and nutrition-related knowledge deficit related to lack of exposure to information as related to diagnosis of: ? CVD ? Overweight related to excessive energy intake as evidenced by a BMI of 26.7  Nutrition Intervention ? Pt's individual nutrition plan and goals reviewed with pt. Pt given handouts for:  ? Nutrition I ? Nutrition II  Nutrition Goal(s):  ? Pt to identify and limit food sources of saturated fat, trans fat, and sodium ? Pt to identify food quantities necessary to achieve weight loss of 6-10 lb (2.7-10.9 kg) at graduation from cardiac rehab. Plan:  Pt to attend nutrition classes ? Portion Distortion  Will provide client-centered nutrition education as part of interdisciplinary care.   Monitor and evaluate progress toward nutrition goal with team.  Greig Castillaachel Mizani Dilday, Dietetic Intern 02/16/2018 11:13 AM

## 2018-02-18 ENCOUNTER — Encounter (HOSPITAL_COMMUNITY)
Admission: RE | Admit: 2018-02-18 | Discharge: 2018-02-18 | Disposition: A | Discharge status: Home / Self Care | Payer: Medicare HMO | Source: Ambulatory Visit | Attending: Cardiovascular Disease | Admitting: Cardiovascular Disease

## 2018-02-18 DIAGNOSIS — Z951 Presence of aortocoronary bypass graft: Secondary | ICD-10-CM | POA: Diagnosis not present

## 2018-02-18 DIAGNOSIS — Z48812 Encounter for surgical aftercare following surgery on the circulatory system: Secondary | ICD-10-CM | POA: Diagnosis not present

## 2018-02-21 ENCOUNTER — Encounter (HOSPITAL_COMMUNITY)
Admission: RE | Admit: 2018-02-21 | Discharge: 2018-02-21 | Disposition: A | Discharge status: Home / Self Care | Payer: Medicare HMO | Source: Ambulatory Visit | Attending: Cardiovascular Disease | Admitting: Cardiovascular Disease

## 2018-02-21 DIAGNOSIS — Z951 Presence of aortocoronary bypass graft: Secondary | ICD-10-CM

## 2018-02-21 DIAGNOSIS — Z48812 Encounter for surgical aftercare following surgery on the circulatory system: Secondary | ICD-10-CM | POA: Diagnosis not present

## 2018-02-22 ENCOUNTER — Other Ambulatory Visit: Payer: Self-pay | Admitting: Physician Assistant

## 2018-02-22 NOTE — Progress Notes (Signed)
Cardiac Individual Treatment Plan  Patient Details  Name: Cathy Smith MRN: [id number] Date of Birth: 1966-05-08 Referring Provider:     CARDIAC REHAB PHASE II ORIENTATION from 01/27/2018 in [facility] CARDIAC REHAB  Referring Provider  Cathy Batty Smith       Initial Encounter Date:    CARDIAC REHAB PHASE II ORIENTATION from 01/27/2018 in [facility] CARDIAC REHAB  Date  01/27/18  Referring Provider  Cathy Batty Smith       Visit Diagnosis: S/P CABG x 3  Patient's Home Medications on Admission:  Current Outpatient Medications:  .  acetaminophen (TYLENOL) 500 MG tablet, Take 2 tablets (1,000 mg total) by mouth every 6 (six) hours as needed. (Patient not taking: Reported on 02/02/2018), Disp: 30 tablet, Rfl: 0 .  aspirin EC 325 MG EC tablet, Take 1 tablet (325 mg total) by mouth daily., Disp: 30 tablet, Rfl: 0 .  atorvastatin (LIPITOR) 20 MG tablet, Take 1 tablet (20 mg total) by mouth daily at 6 PM., Disp: 30 tablet, Rfl: 3 .  furosemide (LASIX) 40 MG tablet, TAKE 1 TABLET BY MOUTH DAILY, Disp: 90 tablet, Rfl: 0 .  GENVOYA 150-150-200-10 MG TABS tablet, TAKE 1 TABLET BY MOUTH DAILY WITH BREAKFAST, Disp: 30 tablet, Rfl: 4 .  Metoprolol Tartrate 37.5 MG TABS, Take 37.5 mg by mouth 2 (two) times daily., Disp: 60 tablet, Rfl: 3 .  oxyCODONE (OXY IR/ROXICODONE) 5 MG immediate release tablet, Take 1 tablet (5 mg total) by mouth every 4 (four) hours as needed for severe pain. (Patient not taking: Reported on 01/25/2018), Disp: 30 tablet, Rfl: 0 .  potassium chloride SA (K-DUR,KLOR-CON) 20 MEQ tablet, Take 1 tablet (20 mEq total) by mouth daily. (Patient not taking: Reported on 01/25/2018), Disp: 7 tablet, Rfl: 0 .  PREZISTA 800 MG tablet, TAKE 1 TABLET(800 MG) BY MOUTH DAILY, Disp: 30 tablet, Rfl: 4  Past Medical History: Past Medical History:  Diagnosis Date  . Coronary artery disease   . History of condyloma acuminatum   . History of uterine fibroid    . History of vaginal dysplasia    VAIN III--  oncologist-  dr Cathy Smith  . History of vulvar dysplasia    VIN III   gyn-oncologist-  dr Cathy Smith  . HIV infection ([facility]) [facility]  dr Cathy Smith  . Hypertension   . Hypertriglyceridemia without hypercholesterolemia   . PONV (postoperative nausea and vomiting)   . VIN III (vulvar intraepithelial neoplasia III)   . Wears glasses     Tobacco Use: Social History   Tobacco Use  Smoking Status Never Smoker  Smokeless Tobacco Never Used    Labs: Recent Review Flowsheet Data    Labs for ITP Cardiac and Pulmonary Rehab Latest Ref Rng & Units 11/25/2017 11/25/2017 11/25/2017 11/26/2017 12/07/2017   Cholestrol 100 - 199 mg/dL - - - - 40(J)   LDLCALC 0 - 99 mg/dL - - - - 29   HDL >81 mg/dL - - - - 19(J)   Trlycerides 0 - 149 mg/dL - - - - 478(G)   Hemoglobin A1c 4.8 - 5.6 % - - - - -   PHART 7.350 - 7.450 7.282(L) 7.291(L) - - -   PCO2ART 32.0 - 48.0 mmHg 46.8 48.8(H) - - -   HCO3 20.0 - 28.0 mmol/L 22.1 23.5 - - -   TCO2 22 - 32 mmol/L 24 25 25 24  -   ACIDBASEDEF 0.0 - 2.0 mmol/L 5.0(H) 3.0(H) - - -  O2SAT % 99.0 99.0 - - -      Capillary Blood Glucose: Lab Results  Component Value Date   GLUCAP 108 (H) 11/29/2017   GLUCAP 132 (H) 11/28/2017   GLUCAP 109 (H) 11/28/2017   GLUCAP 124 (H) 11/28/2017   GLUCAP 115 (H) 11/28/2017     Exercise Target Goals:    Exercise Program Goal: Individual exercise prescription set using results from initial 6 min walk test and THRR while considering  patient's activity barriers and safety.   Exercise Prescription Goal: Initial exercise prescription builds to 30-45 minutes a day of aerobic activity, 2-3 days per week.  Home exercise guidelines will be given to patient during program as part of exercise prescription that the participant will acknowledge.  Activity Barriers & Risk Stratification: Activity Barriers & Cardiac Risk Stratification - 01/27/18 0951      Activity  Barriers & Cardiac Risk Stratification   Activity Barriers  None    Cardiac Risk Stratification  High       6 Minute Walk: 6 Minute Walk    Row Name 01/27/18 1105         6 Minute Walk   Phase  Initial     Distance  1493 feet     Walk Time  6 minutes     # of Rest Breaks  0     MPH  2.8     METS  3.8     RPE  11     Perceived Dyspnea   0     VO2 Peak  13.4     Symptoms  No     Resting HR  74 bpm     Resting BP  113/74     Resting Oxygen Saturation   99 %     Exercise Oxygen Saturation  during 6 min walk  100 %     Max Ex. HR  84 bpm     Max Ex. BP  124/76     2 Minute Post BP  116/78        Oxygen Initial Assessment:   Oxygen Re-Evaluation:   Oxygen Discharge (Final Oxygen Re-Evaluation):   Initial Exercise Prescription: Initial Exercise Prescription - 01/27/18 1100      Date of Initial Exercise RX and Referring Provider   Date  01/27/18    Referring Provider  Cathy Smith, Cathy Smith       Treadmill   MPH  2.4    Grade  1    Minutes  10    METs  3.17      Bike   Level  0.8    Minutes  10    METs  3.02      Arm Ergometer   Level  2    Watts  35    Minutes  10    METs  3.5      Prescription Details   Frequency (times per week)  3x    Duration  Progress to 30 minutes of continuous aerobic without signs/symptoms of physical distress      Intensity   THRR 40-80% of Max Heartrate  66-133    Ratings of Perceived Exertion  11-13    Perceived Dyspnea  0-4      Progression   Progression  Continue progressive overload as per policy without signs/symptoms or physical distress.      Resistance Training   Training Prescription  Yes    Weight  2lbs    Reps  10-15  Perform Capillary Blood Glucose checks as needed.  Exercise Prescription Changes: Exercise Prescription Changes    Row Name 02/02/18 1002 02/14/18 0950           Response to Exercise   Blood Pressure (Admit)  122/72  100/68      Blood Pressure (Exercise)  122/82  122/70       Blood Pressure (Exit)  104/70  102/70      Heart Rate (Admit)  78 bpm  74 bpm      Heart Rate (Exercise)  111 bpm  108 bpm      Heart Rate (Exit)  75 bpm  72 bpm      Rating of Perceived Exertion (Exercise)  11  12      Symptoms  none  none      Comments  Patient arrived late, only 2 stations today.  -      Duration  Progress to 30 minutes of  aerobic without signs/symptoms of physical distress  Progress to 30 minutes of  aerobic without signs/symptoms of physical distress      Intensity  THRR unchanged  THRR unchanged        Progression   Progression  Continue to progress workloads to maintain intensity without signs/symptoms of physical distress.  Continue to progress workloads to maintain intensity without signs/symptoms of physical distress.      Average METs  2.9  3.2        Resistance Training   Training Prescription  No Relaxation, no weights today.  Yes      Weight  -  2lbs      Reps  -  10-15      Time  -  10 Minutes        Interval Training   Interval Training  No  No        Treadmill   MPH  2  2      Grade  0  2      Minutes  10  10      METs  2.53  3.08        Bike   Level  0.8  0.8      Minutes  10  10      METs  3.28  3.24        NuStep   Level  -  3      SPM  -  85      Minutes  -  10        Home Exercise Plan   Plans to continue exercise at  [facility] (comment)      Frequency  -  Add 2 additional days to program exercise sessions.      Initial Home Exercises Provided  -  02/07/18         Exercise Comments: Exercise Comments    Row Name 02/02/18 1100 02/07/18 1013 02/14/18 0950       Exercise Comments  Off to a good start with exercise.  Home exercise guidelines reviewed with patient.  Reviewed METs and goals with patient.        Exercise Goals and Review:   Exercise Goals Re-Evaluation : Exercise Goals Re-Evaluation    Row Name 02/07/18 1013 02/14/18 0950           Exercise Goal Re-Evaluation   Exercise Goals Review   Understanding of Exercise Prescription;Knowledge and understanding of Target Heart Rate Range (THRR);Able to understand and use rate  of perceived exertion (RPE) scale;Increase Physical Activity  Increase Physical Activity      Comments  Home exercise prescription given to patient including THRR, RPE scale, and endpoints for exercise.  Patient is walking outside 10-15 minutes [facility]-2 [facility].      Expected Outcomes  Patient will walk 30 minutes, 1-2 days/week in addition to exercise at cardiac rehab.  Continue exercise at least 30 minutes 4-5 days/week to help achieve health and fitness goals.          Discharge Exercise Prescription (Final Exercise Prescription Changes): Exercise Prescription Changes - 02/14/18 0950      Response to Exercise   Blood Pressure (Admit)  100/68    Blood Pressure (Exercise)  122/70    Blood Pressure (Exit)  102/70    Heart Rate (Admit)  74 bpm    Heart Rate (Exercise)  108 bpm    Heart Rate (Exit)  72 bpm    Rating of Perceived Exertion (Exercise)  12    Symptoms  none    Duration  Progress to 30 minutes of  aerobic without signs/symptoms of physical distress    Intensity  THRR unchanged      Progression   Progression  Continue to progress workloads to maintain intensity without signs/symptoms of physical distress.    Average METs  3.2      Resistance Training   Training Prescription  Yes    Weight  2lbs    Reps  10-15    Time  10 Minutes      Interval Training   Interval Training  No      Treadmill   MPH  2    Grade  2    Minutes  10    METs  3.08      Bike   Level  0.8    Minutes  10    METs  3.24      NuStep   Level  3    SPM  85    Minutes  10      Home Exercise Plan   Plans to continue exercise at  [employer] (comment)    Frequency  Add 2 additional days to program exercise sessions.    Initial Home Exercises Provided  02/07/18       Nutrition:  Target Goals: Understanding of  nutrition guidelines, daily intake of sodium 1500mg , cholesterol 200mg , calories 30% from fat and 7% or less from saturated fats, daily to have 5 or more servings of fruits and vegetables.  Biometrics: Pre Biometrics - 01/27/18 1104      Pre Biometrics   Height  5\' 2"  (1.575 m)    Weight  145 lb 15.1 oz (66.2 kg)    Waist Circumference  38 inches    Hip Circumference  39 inches    Waist to Hip Ratio  0.97 %    BMI (Calculated)  26.69    Triceps Skinfold  25 mm    % Body Fat  38.3 %    Grip Strength  30 kg    Flexibility  15 in    Single Leg Stand  30 seconds        Nutrition Therapy Plan and Nutrition Goals: Nutrition Therapy & Goals - 01/27/18 1215      Nutrition Therapy   Diet  Heart Healthy      Personal Nutrition Goals   Nutrition Goal  Pt to identify and limit food sources  of saturated fat, trans fat, and sodium    Personal Goal #2  Pt to identify food quantities necessary to achieve weight loss of 6-10 lb (2.7-10.9 kg) at graduation from cardiac rehab.      Intervention Plan   Intervention  Prescribe, educate and counsel regarding individualized specific dietary modifications aiming towards targeted core components such as weight, hypertension, lipid management, diabetes, heart failure and other comorbidities.;Nutrition handout(s) given to patient. Healthy Eating    Expected Outcomes  Short Term Goal: Understand basic principles of dietary content, such as calories, fat, sodium, cholesterol and nutrients.;Long Term Goal: Adherence to prescribed nutrition plan.       Nutrition Assessments: Nutrition Assessments - 02/11/18 1029      MEDFICTS Scores   Pre Score  15       Nutrition Goals Re-Evaluation:   Nutrition Goals Re-Evaluation:   Nutrition Goals Discharge (Final Nutrition Goals Re-Evaluation):   Psychosocial: Target Goals: Acknowledge presence or absence of significant depression and/or stress, maximize coping skills, provide positive support system.  Participant is able to verbalize types and ability to use techniques and skills needed for reducing stress and depression.  Initial Review & Psychosocial Screening: Initial Psych Review & Screening - 02/02/18 1621      Initial Review   Current issues with  None Identified      Family Dynamics   Good Support System?  Yes Husband and other family members      Barriers   Psychosocial barriers to participate in program  There are no identifiable barriers or psychosocial needs.      Screening Interventions   Interventions  Encouraged to exercise Pt brought in homework to session today.    Expected Outcomes  Short Term goal: Identification and review with participant of any Quality of Life or Depression concerns found by scoring the questionnaire.;Long Term goal: The participant improves quality of Life and PHQ9 Scores as seen by post scores and/or verbalization of changes       Quality of Life Scores: Quality of Life - 02/02/18 1115      Quality of Life Scores   Health/Function Pre  24.18 %    Socioeconomic Pre  25 %    Psych/Spiritual Pre  28.93 %    Family Pre  29.38 %    GLOBAL Pre  26.05 %      Scores of 19 and below usually indicate a poorer quality of life in these areas.  A difference of  2-3 points is a clinically meaningful difference.  A difference of 2-3 points in the total score of the Quality of Life Index has been associated with significant improvement in overall quality of life, self-image, physical symptoms, and general health in studies assessing change in quality of life.  PHQ-9: Recent Review Flowsheet Data    Depression screen [facility] 2/9 01/19/2018 12/08/2017 04/13/2017 10/12/2016 08/19/2016   Decreased Interest 0 0 0 0 0   Down, Depressed, Hopeless 0 0 0 0 0   PHQ - 2 Score 0 0 0 0 0     Interpretation of Total Score  Total Score Depression Severity:  1-4 = Minimal depression, 5-9 = Mild depression, 10-14 = Moderate depression, 15-19 = Moderately severe depression,  20-27 = Severe depression   Psychosocial Evaluation and Intervention:   Psychosocial Re-Evaluation: Psychosocial Re-Evaluation    Row Name 02/22/18 1520             Psychosocial Re-Evaluation   Current issues with  None Identified  Interventions  Encouraged to attend Cardiac Rehabilitation for the exercise       Continue Psychosocial Services   No Follow up required          Psychosocial Discharge (Final Psychosocial Re-Evaluation): Psychosocial Re-Evaluation - 02/22/18 1520      Psychosocial Re-Evaluation   Current issues with  None Identified    Interventions  Encouraged to attend Cardiac Rehabilitation for the exercise    Continue Psychosocial Services   No Follow up required       Vocational Rehabilitation: Provide vocational rehab assistance to qualifying candidates.   Vocational Rehab Evaluation & Intervention: Vocational Rehab - 02/02/18 1618      Initial Vocational Rehab Evaluation & Intervention   Assessment shows need for Vocational Rehabilitation  No       Education: Education Goals: Education classes will be provided on a weekly basis, covering required topics. Participant will state understanding/return demonstration of topics presented.  Learning Barriers/Preferences: Learning Barriers/Preferences - 01/27/18 0951      Learning Barriers/Preferences   Learning Barriers  Sight    Learning Preferences  Skilled Demonstration;Verbal Instruction;Written Material;Video       Education Topics: Count Your Pulse:  -Group instruction provided by verbal instruction, demonstration, patient participation and written materials to support subject.  Instructors address importance of being able to find your pulse and how to count your pulse when at home without a heart monitor.  Patients get hands on experience counting their pulse with staff help and individually.   Heart Attack, Angina, and Risk Factor Modification:  -Group instruction provided by verbal  instruction, video, and written materials to support subject.  Instructors address signs and symptoms of angina and heart attacks.    Also discuss risk factors for heart disease and how to make changes to improve heart health risk factors.   Functional Fitness:  -Group instruction provided by verbal instruction, demonstration, patient participation, and written materials to support subject.  Instructors address safety measures for doing things around the house.  Discuss how to get up and down off the floor, how to pick things up properly, how to safely get out of a chair without assistance, and balance training.   Meditation and Mindfulness:  -Group instruction provided by verbal instruction, patient participation, and written materials to support subject.  Instructor addresses importance of mindfulness and meditation practice to help reduce stress and improve awareness.  Instructor also leads participants through a meditation exercise.    Stretching for Flexibility and Mobility:  -Group instruction provided by verbal instruction, patient participation, and written materials to support subject.  Instructors lead participants through series of stretches that are designed to increase flexibility thus improving mobility.  These stretches are additional exercise for major muscle groups that are typically performed during regular warm up and cool down.   Hands Only CPR:  -Group verbal, video, and participation provides a basic overview of AHA guidelines for community CPR. Role-play of emergencies allow participants the opportunity to practice calling for help and chest compression technique with discussion of AED use.   Hypertension: -Group verbal and written instruction that provides a basic overview of hypertension including the most recent diagnostic guidelines, risk factor reduction with self-care instructions and medication management.   CARDIAC REHAB PHASE II EXERCISE from 02/16/2018 in [facility] CARDIAC REHAB  Date  02/04/18  Instruction Review Code  2- Demonstrated Understanding       Nutrition I class: Heart Healthy Eating:  -Group instruction provided by PowerPoint slides,  verbal discussion, and written materials to support subject matter. The instructor gives an explanation and review of the Therapeutic Lifestyle Changes diet recommendations, which includes a discussion on lipid goals, dietary fat, sodium, fiber, plant stanol/sterol esters, sugar, and the components of a well-balanced, healthy diet.   Nutrition II class: Lifestyle Skills:  -Group instruction provided by PowerPoint slides, verbal discussion, and written materials to support subject matter. The instructor gives an explanation and review of label reading, grocery shopping for heart health, heart healthy recipe modifications, and ways to make healthier choices when eating out.   Diabetes Question & Answer:  -Group instruction provided by PowerPoint slides, verbal discussion, and written materials to support subject matter. The instructor gives an explanation and review of diabetes co-morbidities, pre- and post-prandial blood glucose goals, pre-exercise blood glucose goals, signs, symptoms, and treatment of hypoglycemia and hyperglycemia, and foot care basics.   Diabetes Blitz:  -Group instruction provided by PowerPoint slides, verbal discussion, and written materials to support subject matter. The instructor gives an explanation and review of the physiology behind type 1 and type 2 diabetes, diabetes medications and rational behind using different medications, pre- and post-prandial blood glucose recommendations and Hemoglobin A1c goals, diabetes diet, and exercise including blood glucose guidelines for exercising safely.    Portion Distortion:  -Group instruction provided by PowerPoint slides, verbal discussion, written materials, and food models to support subject matter. The instructor gives  an explanation of serving size versus portion size, changes in portions sizes over the last 20 years, and what consists of a serving from each food group.   CARDIAC REHAB PHASE II EXERCISE from 02/16/2018 in [facility] CARDIAC REHAB  Date  02/02/18  Educator  RD  Instruction Review Code  2- Demonstrated Understanding      Stress Management:  -Group instruction provided by verbal instruction, video, and written materials to support subject matter.  Instructors review role of stress in heart disease and how to cope with stress positively.     Exercising on Your Own:  -Group instruction provided by verbal instruction, power point, and written materials to support subject.  Instructors discuss benefits of exercise, components of exercise, frequency and intensity of exercise, and end points for exercise.  Also discuss use of nitroglycerin and activating EMS.  Review options of places to exercise outside of rehab.  Review guidelines for sex with heart disease.   Cardiac Drugs I:  -Group instruction provided by verbal instruction and written materials to support subject.  Instructor reviews cardiac drug classes: antiplatelets, anticoagulants, beta blockers, and statins.  Instructor discusses reasons, side effects, and lifestyle considerations for each drug class.   Cardiac Drugs II:  -Group instruction provided by verbal instruction and written materials to support subject.  Instructor reviews cardiac drug classes: angiotensin converting enzyme inhibitors (ACE-I), angiotensin II receptor blockers (ARBs), nitrates, and calcium channel blockers.  Instructor discusses reasons, side effects, and lifestyle considerations for each drug class.   CARDIAC REHAB PHASE II EXERCISE from 02/16/2018 in [facility] CARDIAC REHAB  Date  02/16/18  Instruction Review Code  2- Demonstrated Understanding      Anatomy and Physiology of the Circulatory System:  Group verbal and  written instruction and models provide basic cardiac anatomy and physiology, with the coronary electrical and arterial systems. Review of: AMI, Angina, Valve disease, Heart Failure, Peripheral Artery Disease, Cardiac Arrhythmia, Pacemakers, and the ICD.   Other Education:  -Group or individual verbal, written, or video instructions that support the  educational goals of the cardiac rehab program.   Holiday Eating Survival Tips:  -Group instruction provided by PowerPoint slides, verbal discussion, and written materials to support subject matter. The instructor gives patients tips, tricks, and techniques to help them not only survive but enjoy the holidays despite the onslaught of food that accompanies the holidays.   Knowledge Questionnaire Score: Knowledge Questionnaire Score - 02/02/18 1115      Knowledge Questionnaire Score   Pre Score  18/24       Core Components/Risk Factors/Patient Goals at Admission: Personal Goals and Risk Factors at Admission - 01/27/18 1103      Core Components/Risk Factors/Patient Goals on Admission    Weight Management  Yes    Intervention  Weight Management: Develop a combined nutrition and exercise program designed to reach desired caloric intake, while maintaining appropriate intake of nutrient and fiber, sodium and fats, and appropriate energy expenditure required for the weight goal.;Weight Management: Provide education and appropriate resources to help participant work on and attain dietary goals.    Admit Weight  145 lb 10.2 oz (66.1 kg)    Goal Weight: Short Term  140 lb (63.5 kg)    Goal Weight: Long Term  144 lb (65.3 kg)    Expected Outcomes  Short Term: Continue to assess and modify interventions until short term weight is achieved;Long Term: Adherence to nutrition and physical activity/exercise program aimed toward attainment of established weight goal;Weight Loss: Understanding of general recommendations for a balanced deficit meal plan, which  promotes 1-2 lb weight loss per week and includes a negative energy balance of [phone] kcal/d;Understanding recommendations for meals to include 15-35% energy as protein, 25-35% energy from fat, 35-60% energy from carbohydrates, less than 200mg  of dietary cholesterol, 20-35 gm of total fiber daily;Understanding of distribution of calorie intake throughout the day with the consumption of 4-5 meals/snacks    Hypertension  Yes    Intervention  Provide education on lifestyle modifcations including regular physical activity/exercise, weight management, moderate sodium restriction and increased consumption of fresh fruit, vegetables, and low fat dairy, alcohol moderation, and smoking cessation.;Monitor prescription use compliance.    Expected Outcomes  Short Term: Continued assessment and intervention until BP is < 140/13mm HG in hypertensive participants. < 130/56mm HG in hypertensive participants with diabetes, heart failure or chronic kidney disease.;Long Term: Maintenance of blood pressure at goal levels.    Lipids  Yes    Intervention  Provide education and support for participant on nutrition & aerobic/resistive exercise along with prescribed medications to achieve LDL 70mg , HDL >40mg .    Expected Outcomes  Long Term: Cholesterol controlled with medications as prescribed, with individualized exercise RX and with personalized nutrition plan. Value goals: LDL < 70mg , HDL > 40 mg.;Short Term: Participant states understanding of desired cholesterol values and is compliant with medications prescribed. Participant is following exercise prescription and nutrition guidelines.       Core Components/Risk Factors/Patient Goals Review:  Goals and Risk Factor Review    Row Name 02/22/18 1517             Core Components/Risk Factors/Patient Goals Review   Personal Goals Review  Weight Management/Obesity;Lipids;Hypertension       Review  Ski's vital signs have been stable at phase 2 cardiac rehab. Roni is  doing well with exercise.       Expected Outcomes  Marcha will continue to take her medications as prescribed and partcipate in phase 2 cardiac rehab  Core Components/Risk Factors/Patient Goals at Discharge (Final Review):  Goals and Risk Factor Review - 02/22/18 1517      Core Components/Risk Factors/Patient Goals Review   Personal Goals Review  Weight Management/Obesity;Lipids;Hypertension    Review  Elmira's vital signs have been stable at phase 2 cardiac rehab. Jerene is doing well with exercise.    Expected Outcomes  Lashona will continue to take her medications as prescribed and partcipate in phase 2 cardiac rehab        ITP Comments: ITP Comments    Row Name 01/27/18 0949 02/22/18 1515         ITP Comments  Dr. Armanda Magic, Medical Director   30 day ITP Review. Patient with good attendance and participation in phase 2 cardiac rehab.         Comments:  See ITP comments.Gladstone Lighter, RN,BSN 02/22/2018 3:21 PM

## 2018-02-23 ENCOUNTER — Encounter (HOSPITAL_COMMUNITY)
Admission: RE | Admit: 2018-02-23 | Discharge: 2018-02-23 | Disposition: A | Discharge status: Home / Self Care | Payer: Medicare HMO | Source: Ambulatory Visit | Attending: Cardiovascular Disease | Admitting: Cardiovascular Disease

## 2018-02-23 DIAGNOSIS — Z951 Presence of aortocoronary bypass graft: Secondary | ICD-10-CM

## 2018-02-23 DIAGNOSIS — Z48812 Encounter for surgical aftercare following surgery on the circulatory system: Secondary | ICD-10-CM | POA: Diagnosis not present

## 2018-02-25 ENCOUNTER — Encounter (HOSPITAL_COMMUNITY)
Admission: RE | Admit: 2018-02-25 | Discharge: 2018-02-25 | Disposition: A | Discharge status: Home / Self Care | Payer: Medicare HMO | Source: Ambulatory Visit | Attending: Cardiovascular Disease | Admitting: Cardiovascular Disease

## 2018-02-25 DIAGNOSIS — Z48812 Encounter for surgical aftercare following surgery on the circulatory system: Secondary | ICD-10-CM | POA: Diagnosis not present

## 2018-02-25 DIAGNOSIS — Z951 Presence of aortocoronary bypass graft: Secondary | ICD-10-CM

## 2018-02-26 ENCOUNTER — Other Ambulatory Visit: Payer: Self-pay | Admitting: Physician Assistant

## 2018-02-26 DIAGNOSIS — H524 Presbyopia: Secondary | ICD-10-CM | POA: Diagnosis not present

## 2018-02-28 ENCOUNTER — Encounter (HOSPITAL_COMMUNITY)
Admission: RE | Admit: 2018-02-28 | Discharge: 2018-02-28 | Disposition: A | Discharge status: Home / Self Care | Payer: Medicare HMO | Source: Ambulatory Visit | Attending: Cardiovascular Disease | Admitting: Cardiovascular Disease

## 2018-02-28 DIAGNOSIS — Z951 Presence of aortocoronary bypass graft: Secondary | ICD-10-CM

## 2018-02-28 DIAGNOSIS — Z48812 Encounter for surgical aftercare following surgery on the circulatory system: Secondary | ICD-10-CM | POA: Diagnosis not present

## 2018-03-02 ENCOUNTER — Encounter (HOSPITAL_COMMUNITY)
Admission: RE | Admit: 2018-03-02 | Discharge: 2018-03-02 | Disposition: A | Discharge status: Home / Self Care | Payer: Medicare HMO | Source: Ambulatory Visit | Attending: Cardiovascular Disease | Admitting: Cardiovascular Disease

## 2018-03-02 DIAGNOSIS — Z951 Presence of aortocoronary bypass graft: Secondary | ICD-10-CM

## 2018-03-02 DIAGNOSIS — Z48812 Encounter for surgical aftercare following surgery on the circulatory system: Secondary | ICD-10-CM | POA: Diagnosis not present

## 2018-03-04 ENCOUNTER — Other Ambulatory Visit: Payer: Self-pay | Admitting: Family Medicine

## 2018-03-04 ENCOUNTER — Telehealth: Payer: Self-pay | Admitting: Cardiovascular Disease

## 2018-03-04 ENCOUNTER — Encounter (HOSPITAL_COMMUNITY)
Admission: RE | Admit: 2018-03-04 | Discharge: 2018-03-04 | Disposition: A | Discharge status: Home / Self Care | Payer: Medicare HMO | Source: Ambulatory Visit | Attending: Cardiovascular Disease | Admitting: Cardiovascular Disease

## 2018-03-04 DIAGNOSIS — Z951 Presence of aortocoronary bypass graft: Secondary | ICD-10-CM

## 2018-03-04 DIAGNOSIS — Z48812 Encounter for surgical aftercare following surgery on the circulatory system: Secondary | ICD-10-CM | POA: Diagnosis not present

## 2018-03-04 MED ORDER — FUROSEMIDE 40 MG PO TABS: 40.0000 mg | ORAL_TABLET | Freq: Every day | ORAL | 0 refills | Status: DC

## 2018-03-04 NOTE — Telephone Encounter (Signed)
. °*  STAT* If patient is at the pharmacy, call can be transferred to refill team.   1. Which medications need to be refilled? (please list name of each medication and dose if known) new prescription for Furosemide  2. Which pharmacy/location (including street and city if local pharmacy) is medication to be sent to?[facility] [phone]  3. Do they need a 30 day or 90 day supply?90 and refills

## 2018-03-07 ENCOUNTER — Encounter (HOSPITAL_COMMUNITY)
Admission: RE | Admit: 2018-03-07 | Discharge: 2018-03-07 | Disposition: A | Discharge status: Home / Self Care | Payer: Medicare HMO | Source: Ambulatory Visit | Attending: Cardiovascular Disease | Admitting: Cardiovascular Disease

## 2018-03-07 DIAGNOSIS — Z951 Presence of aortocoronary bypass graft: Secondary | ICD-10-CM

## 2018-03-07 DIAGNOSIS — Z48812 Encounter for surgical aftercare following surgery on the circulatory system: Secondary | ICD-10-CM | POA: Diagnosis not present

## 2018-03-08 ENCOUNTER — Encounter: Payer: Self-pay | Admitting: Cardiovascular Disease

## 2018-03-08 ENCOUNTER — Ambulatory Visit (INDEPENDENT_AMBULATORY_CARE_PROVIDER_SITE_OTHER): Payer: Medicare HMO | Admitting: Cardiovascular Disease

## 2018-03-08 DIAGNOSIS — I10 Essential (primary) hypertension: Secondary | ICD-10-CM | POA: Diagnosis not present

## 2018-03-08 DIAGNOSIS — Z951 Presence of aortocoronary bypass graft: Secondary | ICD-10-CM

## 2018-03-08 DIAGNOSIS — E781 Pure hyperglyceridemia: Secondary | ICD-10-CM

## 2018-03-08 NOTE — Progress Notes (Signed)
03/08/2018 Cathy Smith   1966-05-02  [id number]  Primary Physician Bing Neighbors, FNP Primary Cardiologist: Runell Gess MD Nicholes [place], [place]  HPI:  Cathy Smith is a 52 y.o.  moderately overweight married African-American female with no children. Does not work. She was referred to me byKimberly Harris FNPfor new onset chest pain. I last saw her in the office 12/07/2017.She really has no cardiac risk factors other than hypertension hyperlipidemia both of which are treated. There is no family history. She does not smoke. She had onset of chest pain 3-4 months ago,, currently trying to 3 times a week lasting up to 5-10 minutes at a time associated with shortness of breath.she had a Myoview stress test that showed severe ischemia in the LAD territory. She presents underwent cardiac catheterization by myself 11/22/17 revealing left main/three-vessel disease with preserved LV function. She underwent coronary artery bypass graft 31/17/19 by Dr. Ashley Mariner with a  LIMA to LAD, vein to obtuse marginal branch and  Vein to the posterior lateral branch of the RCA. Her postoperative course was uncomplicated.  Since I saw her in the office approximately 3 months ago she is remained stable.  She is participating in cardiac rehab.  She denies chest pain or shortness of breath.    Current Meds  Medication Sig  . acetaminophen (TYLENOL) 500 MG tablet Take 2 tablets (1,000 mg total) by mouth every 6 (six) hours as needed.  Marland Kitchen aspirin EC 325 MG EC tablet Take 1 tablet (325 mg total) by mouth daily.  Marland Kitchen atorvastatin (LIPITOR) 20 MG tablet Take 1 tablet (20 mg total) by mouth daily at 6 PM.  . furosemide (LASIX) 40 MG tablet Take 1 tablet (40 mg total) by mouth daily.  . GENVOYA 150-150-200-10 MG TABS tablet TAKE 1 TABLET BY MOUTH DAILY WITH BREAKFAST  . Metoprolol Tartrate 37.5 MG TABS Take 37.5 mg by mouth 2 (two) times daily.  Marland Kitchen oxyCODONE (OXY IR/ROXICODONE) 5 MG immediate release tablet  Take 1 tablet (5 mg total) by mouth every 4 (four) hours as needed for severe pain.  . potassium chloride SA (K-DUR,KLOR-CON) 20 MEQ tablet Take 1 tablet (20 mEq total) by mouth daily.  Marland Kitchen PREZISTA 800 MG tablet TAKE 1 TABLET(800 MG) BY MOUTH DAILY     No Known Allergies  Social History   Socioeconomic History  . Marital status: Married    Spouse name: Not on file  . Number of children: Not on file  . Years of education: Not on file  . Highest education level: Not on file  Occupational History  . Not on file  Social Needs  . Financial resource strain: Not on file  . Food insecurity:    Worry: Not on file    Inability: Not on file  . Transportation needs:    Medical: Not on file    Non-medical: Not on file  Tobacco Use  . Smoking status: Never Smoker  . Smokeless tobacco: Never Used  Substance and Sexual Activity  . Alcohol use: No    Alcohol/week: 0.0 oz  . Drug use: No  . Sexual activity: Yes    Partners: Male    Birth control/protection: Condom    Comment: pt. given condoms  Lifestyle  . Physical activity:    Days per week: Not on file    Minutes per session: Not on file  . Stress: Not on file  Relationships  . Social connections:    Talks on  phone: Not on file    Gets together: Not on file    Attends religious service: Not on file    Active member of club or organization: Not on file    Attends meetings of clubs or organizations: Not on file    Relationship status: Not on file  . Intimate partner violence:    Fear of current or ex partner: Not on file    Emotionally abused: Not on file    Physically abused: Not on file    Forced sexual activity: Not on file  Other Topics Concern  . Not on file  Social History Narrative  . Not on file     Review of Systems: General: negative for chills, fever, night sweats or weight changes.  Cardiovascular: negative for chest pain, dyspnea on exertion, edema, orthopnea, palpitations, paroxysmal nocturnal dyspnea or  shortness of breath Dermatological: negative for rash Respiratory: negative for cough or wheezing Urologic: negative for hematuria Abdominal: negative for nausea, vomiting, diarrhea, bright red blood per rectum, melena, or hematemesis Neurologic: negative for visual changes, syncope, or dizziness All other systems reviewed and are otherwise negative except as noted above.    Blood pressure 112/72, pulse 63, height  (1.549 m), weight 148 lb (67.1 kg).  General appearance: alert and no distress Neck: no adenopathy, no carotid bruit, no JVD, supple, symmetrical, trachea midline and thyroid not enlarged, symmetric, no tenderness/mass/nodules Lungs: clear to auscultation bilaterally Heart: regular rate and rhythm, S1, S2 normal, no murmur, click, rub or gallop Extremities: extremities normal, atraumatic, no cyanosis or edema Pulses: 2+ and symmetric Skin: Skin color, texture, turgor normal. No rashes or lesions Neurologic: Alert and oriented X 3, normal strength and tone. Normal symmetric reflexes. Normal coordination and gait  EKG not performed today.  ASSESSMENT AND PLAN:   Essential hypertension Essential hypertension with blood pressure measured today at 112/72.  She is on metoprolol.  Continue current meds at current dose.  S/P CABG x 3 History of coronary artery disease status post cardiac catheterization performed by myself 11/22/2017 revealing left main/three-vessel disease.  She subsequently underwent coronary artery bypass grafting x3 by Dr. Cornelius Moras 11/25/2017 LIMA to her LAD, vein graft to the obtuse marginal branch to lateral branch of the RCA her postoperative course was uncomplicated.  She is participating in cardiac rehab.  Hypertriglyceridemia without hypercholesterolemia History of hyperlipidemia on statin therapy with lipid profile performed 12/07/2017 revealing an LDL of 29 and HDL of 28.      Runell Gess MD FACP,FACC,FAHA, [facility] 03/08/2018 9:41 AM

## 2018-03-08 NOTE — Assessment & Plan Note (Signed)
History of coronary artery disease status post cardiac catheterization performed by myself 11/22/2017 revealing left main/three-vessel disease.  She subsequently underwent coronary artery bypass grafting x3 by Dr. Cornelius Moras 11/25/2017 LIMA to her LAD, vein graft to the obtuse marginal branch to lateral branch of the RCA her postoperative course was uncomplicated.  She is participating in cardiac rehab.

## 2018-03-08 NOTE — Patient Instructions (Signed)

## 2018-03-08 NOTE — Assessment & Plan Note (Signed)
History of hyperlipidemia on statin therapy with lipid profile performed 12/07/2017 revealing an LDL of 29 and HDL of 28.

## 2018-03-08 NOTE — Assessment & Plan Note (Signed)
Essential hypertension with blood pressure measured today at 112/72.  She is on metoprolol.  Continue current meds at current dose.

## 2018-03-09 ENCOUNTER — Encounter (HOSPITAL_COMMUNITY)
Admission: RE | Admit: 2018-03-09 | Discharge: 2018-03-09 | Disposition: A | Discharge status: Home / Self Care | Payer: Medicare HMO | Source: Ambulatory Visit | Attending: Cardiovascular Disease | Admitting: Cardiovascular Disease

## 2018-03-09 DIAGNOSIS — Z951 Presence of aortocoronary bypass graft: Secondary | ICD-10-CM | POA: Insufficient documentation

## 2018-03-09 DIAGNOSIS — Z48812 Encounter for surgical aftercare following surgery on the circulatory system: Secondary | ICD-10-CM | POA: Insufficient documentation

## 2018-03-11 ENCOUNTER — Encounter (HOSPITAL_COMMUNITY)
Admission: RE | Admit: 2018-03-11 | Discharge: 2018-03-11 | Disposition: A | Discharge status: Home / Self Care | Payer: Medicare HMO | Source: Ambulatory Visit | Attending: Cardiovascular Disease | Admitting: Cardiovascular Disease

## 2018-03-11 DIAGNOSIS — Z48812 Encounter for surgical aftercare following surgery on the circulatory system: Secondary | ICD-10-CM | POA: Diagnosis not present

## 2018-03-11 DIAGNOSIS — Z951 Presence of aortocoronary bypass graft: Secondary | ICD-10-CM | POA: Diagnosis not present

## 2018-03-14 ENCOUNTER — Encounter (HOSPITAL_COMMUNITY)
Admission: RE | Admit: 2018-03-14 | Discharge: 2018-03-14 | Disposition: A | Discharge status: Home / Self Care | Payer: Medicare HMO | Source: Ambulatory Visit | Attending: Cardiovascular Disease | Admitting: Cardiovascular Disease

## 2018-03-14 DIAGNOSIS — Z951 Presence of aortocoronary bypass graft: Secondary | ICD-10-CM | POA: Diagnosis not present

## 2018-03-14 DIAGNOSIS — Z48812 Encounter for surgical aftercare following surgery on the circulatory system: Secondary | ICD-10-CM | POA: Diagnosis not present

## 2018-03-16 ENCOUNTER — Encounter (HOSPITAL_COMMUNITY)
Admission: RE | Admit: 2018-03-16 | Discharge: 2018-03-16 | Disposition: A | Discharge status: Home / Self Care | Payer: Medicare HMO | Source: Ambulatory Visit | Attending: Cardiovascular Disease | Admitting: Cardiovascular Disease

## 2018-03-16 ENCOUNTER — Telehealth: Payer: Self-pay | Admitting: Cardiovascular Disease

## 2018-03-16 DIAGNOSIS — Z951 Presence of aortocoronary bypass graft: Secondary | ICD-10-CM

## 2018-03-16 DIAGNOSIS — Z48812 Encounter for surgical aftercare following surgery on the circulatory system: Secondary | ICD-10-CM | POA: Diagnosis not present

## 2018-03-16 NOTE — Telephone Encounter (Signed)
New Message:  Pt wants to know if Dr Allyson Sabal thinks it will be alright for her to walk in [facility] on 03-26-18?

## 2018-03-16 NOTE — Telephone Encounter (Signed)
Spoke with pt who states she would like to participate is the heart walk on 03/26/18 and would like the approval of Dr. Allyson Sabal. She denies any current complications. Routed

## 2018-03-18 ENCOUNTER — Encounter (HOSPITAL_COMMUNITY): Payer: Medicare HMO

## 2018-03-18 NOTE — Telephone Encounter (Signed)
That is fine with me.

## 2018-03-21 ENCOUNTER — Other Ambulatory Visit: Payer: Self-pay | Admitting: Physician Assistant

## 2018-03-21 ENCOUNTER — Encounter (HOSPITAL_COMMUNITY)
Admission: RE | Admit: 2018-03-21 | Discharge: 2018-03-21 | Disposition: A | Discharge status: Home / Self Care | Payer: Medicare HMO | Source: Ambulatory Visit | Attending: Cardiovascular Disease | Admitting: Cardiovascular Disease

## 2018-03-21 ENCOUNTER — Other Ambulatory Visit: Payer: Self-pay | Admitting: Infectious Diseases

## 2018-03-21 DIAGNOSIS — Z951 Presence of aortocoronary bypass graft: Secondary | ICD-10-CM

## 2018-03-21 DIAGNOSIS — Z48812 Encounter for surgical aftercare following surgery on the circulatory system: Secondary | ICD-10-CM | POA: Diagnosis not present

## 2018-03-21 DIAGNOSIS — B20 Human immunodeficiency virus [HIV] disease: Secondary | ICD-10-CM

## 2018-03-22 NOTE — Telephone Encounter (Signed)
Spoke with pt, information given.

## 2018-03-22 NOTE — Progress Notes (Signed)
Cardiac Individual Treatment Plan  Patient Details  Name: Cathy Smith MRN: [id number] Date of Birth: January 26, 1966 Referring Provider:     CARDIAC REHAB PHASE II ORIENTATION from 01/27/2018 in [facility] CARDIAC REHAB  Referring Provider  Nanetta Batty MD       Initial Encounter Date:    CARDIAC REHAB PHASE II ORIENTATION from 01/27/2018 in [facility] CARDIAC REHAB  Date  01/27/18  Referring Provider  Nanetta Batty MD       Visit Diagnosis: S/P CABG x 3  Patient's Home Medications on Admission:  Current Outpatient Medications:  .  acetaminophen (TYLENOL) 500 MG tablet, Take 2 tablets (1,000 mg total) by mouth every 6 (six) hours as needed., Disp: 30 tablet, Rfl: 0 .  aspirin EC 325 MG EC tablet, Take 1 tablet (325 mg total) by mouth daily., Disp: 30 tablet, Rfl: 0 .  atorvastatin (LIPITOR) 20 MG tablet, Take 1 tablet (20 mg total) by mouth daily at 6 PM., Disp: 30 tablet, Rfl: 3 .  furosemide (LASIX) 40 MG tablet, Take 1 tablet (40 mg total) by mouth daily., Disp: 90 tablet, Rfl: 0 .  GENVOYA 150-150-200-10 MG TABS tablet, TAKE 1 TABLET BY MOUTH DAILY WITH BREAKFAST, Disp: 30 tablet, Rfl: 5 .  Metoprolol Tartrate 37.5 MG TABS, Take 37.5 mg by mouth 2 (two) times daily., Disp: 60 tablet, Rfl: 3 .  oxyCODONE (OXY IR/ROXICODONE) 5 MG immediate release tablet, Take 1 tablet (5 mg total) by mouth every 4 (four) hours as needed for severe pain., Disp: 30 tablet, Rfl: 0 .  potassium chloride SA (K-DUR,KLOR-CON) 20 MEQ tablet, Take 1 tablet (20 mEq total) by mouth daily., Disp: 7 tablet, Rfl: 0 .  PREZISTA 800 MG tablet, TAKE 1 TABLET(800 MG) BY MOUTH DAILY, Disp: 30 tablet, Rfl: 5  Past Medical History: Past Medical History:  Diagnosis Date  . Coronary artery disease   . History of condyloma acuminatum   . History of uterine fibroid   . History of vaginal dysplasia    VAIN III--  oncologist-  dr Andrey Farmer  . History of vulvar dysplasia    VIN III    gyn-oncologist-  dr Andrey Farmer  . HIV infection ([facility]) [facility]-  dr hatcher  . Hypertension   . Hypertriglyceridemia without hypercholesterolemia   . PONV (postoperative nausea and vomiting)   . VIN III (vulvar intraepithelial neoplasia III)   . Wears glasses     Tobacco Use: Social History   Tobacco Use  Smoking Status Never Smoker  Smokeless Tobacco Never Used    Labs: Recent Review Flowsheet Data    Labs for ITP Cardiac and Pulmonary Rehab Latest Ref Rng & Units 11/25/2017 11/25/2017 11/25/2017 11/26/2017 12/07/2017   Cholestrol 100 - 199 mg/dL - - - - 86(V)   LDLCALC 0 - 99 mg/dL - - - - 29   HDL >78 mg/dL - - - - 46(N)   Trlycerides 0 - 149 mg/dL - - - - 629(B)   Hemoglobin A1c 4.8 - 5.6 % - - - - -   PHART 7.350 - 7.450 7.282(L) 7.291(L) - - -   PCO2ART 32.0 - 48.0 mmHg 46.8 48.8(H) - - -   HCO3 20.0 - 28.0 mmol/L 22.1 23.5 - - -   TCO2 22 - 32 mmol/L -   ACIDBASEDEF 0.0 - 2.0 mmol/L 5.0(H) 3.0(H) - - -   O2SAT % 99.0 99.0 - - -  Capillary Blood Glucose: Lab Results  Component Value Date   GLUCAP 108 (H) 11/29/2017   GLUCAP 132 (H) 11/28/2017   GLUCAP 109 (H) 11/28/2017   GLUCAP 124 (H) 11/28/2017   GLUCAP 115 (H) 11/28/2017     Exercise Target Goals:    Exercise Program Goal: Individual exercise prescription set using results from initial 6 min walk test and THRR while considering  patient's activity barriers and safety.   Exercise Prescription Goal: Initial exercise prescription builds to 30-45 minutes a day of aerobic activity, 2-3 days per week.  Home exercise guidelines will be given to patient during program as part of exercise prescription that the participant will acknowledge.  Activity Barriers & Risk Stratification: Activity Barriers & Cardiac Risk Stratification - 01/27/18 0951      Activity Barriers & Cardiac Risk Stratification   Activity Barriers  None    Cardiac Risk Stratification  High       6  Minute Walk: 6 Minute Walk    Row Name 01/27/18 1105         6 Minute Walk   Phase  Initial     Distance  1493 feet     Walk Time  6 minutes     # of Rest Breaks  0     MPH  2.8     METS  3.8     RPE  11     Perceived Dyspnea   0     VO2 Peak  13.4     Symptoms  No     Resting HR  74 bpm     Resting BP  113/74     Resting Oxygen Saturation   99 %     Exercise Oxygen Saturation  during 6 min walk  100 %     Max Ex. HR  84 bpm     Max Ex. BP  124/76     2 Minute Post BP  116/78        Oxygen Initial Assessment:   Oxygen Re-Evaluation:   Oxygen Discharge (Final Oxygen Re-Evaluation):   Initial Exercise Prescription: Initial Exercise Prescription - 01/27/18 1100      Date of Initial Exercise RX and Referring Provider   Date  01/27/18    Referring Provider  Nanetta Batty MD       Treadmill   MPH  2.4    Grade  1    Minutes  10    METs  3.17      Bike   Level  0.8    Minutes  10    METs  3.02      Arm Ergometer   Level  2    Watts  35    Minutes  10    METs  3.5      Prescription Details   Frequency (times per week)  3x    Duration  Progress to 30 minutes of continuous aerobic without signs/symptoms of physical distress      Intensity   THRR 40-80% of Max Heartrate  66-133    Ratings of Perceived Exertion  11-13    Perceived Dyspnea  0-4      Progression   Progression  Continue progressive overload as per policy without signs/symptoms or physical distress.      Resistance Training   Training Prescription  Yes    Weight  2lbs    Reps  10-15       Perform Capillary Blood Glucose checks as needed.  Exercise Prescription Changes: Exercise Prescription Changes    Row Name 02/02/18 1002 02/14/18 0950 02/28/18 0952 03/14/18 1005       Response to Exercise   Blood Pressure (Admit)  122/72  100/68  104/72  112/68    Blood Pressure (Exercise)  122/82  122/70  110/70  128/70    Blood Pressure (Exit)  104/70  102/70  117/64  106/50    Heart  Rate (Admit)  78 bpm  74 bpm  71 bpm  88 bpm    Heart Rate (Exercise)  111 bpm  108 bpm  93 bpm  124 bpm    Heart Rate (Exit)  75 bpm  72 bpm  73 bpm  88 bpm    Rating of Perceived Exertion (Exercise)  Symptoms  none  none  none  none    Comments  Patient arrived late, only 2 stations today.  -  -  -    Duration  Progress to 30 minutes of  aerobic without signs/symptoms of physical distress  Progress to 30 minutes of  aerobic without signs/symptoms of physical distress  Progress to 30 minutes of  aerobic without signs/symptoms of physical distress  Progress to 30 minutes of  aerobic without signs/symptoms of physical distress    Intensity  THRR unchanged  THRR unchanged  THRR unchanged  THRR unchanged      Progression   Progression  Continue to progress workloads to maintain intensity without signs/symptoms of physical distress.  Continue to progress workloads to maintain intensity without signs/symptoms of physical distress.  Continue to progress workloads to maintain intensity without signs/symptoms of physical distress.  Continue to progress workloads to maintain intensity without signs/symptoms of physical distress.    Average METs  2.9  3.2  2.9  3.4      Resistance Training   Training Prescription  No Relaxation, no weights today.  Yes  Yes  Yes    Weight  -  2lbs  2lbs  3lbs    Reps  -  10-15  10-15  10-15    Time  -  10 Minutes  10 Minutes  10 Minutes      Interval Training   Interval Training  No  No  No  No      Treadmill   MPH  2  2  2.2  2.2    Grade  0  Minutes  METs  2.53  3.08  3.29  3.29      Bike   Level  0.8  0.8  0.8  0.8    Minutes  METs  3.28  3.24  3.21  3.21      NuStep   Level  -  SPM  -  85  85  85    Minutes  -  METs  -  -  2.1  3.7      Home Exercise Plan   Plans to continue exercise at  -  [employer] (comment)  [employer] (comment)  [employer] (comment)    Frequency  -  Add 2 additional days to program exercise sessions.  Add 2 additional days to program exercise sessions.  Add  2 additional days to program exercise sessions.    Initial Home Exercises Provided  -  02/07/18  02/07/18  02/07/18       Exercise Comments: Exercise Comments    Row Name 02/02/18 1100 02/07/18 1013 02/14/18 0950 02/28/18 0952 03/16/18 1018   Exercise Comments  Off to a good start with exercise.  Home exercise guidelines reviewed with patient.  Reviewed METs and goals with patient.  Reviewed METs with patient.  Reviewed METs and goals with patient.      Exercise Goals and Review:   Exercise Goals Re-Evaluation : Exercise Goals Re-Evaluation    Row Name 02/07/18 1013 02/14/18 0950 03/16/18 1018         Exercise Goal Re-Evaluation   Exercise Goals Review  Understanding of Exercise Prescription;Knowledge and understanding of Target Heart Rate Range (THRR);Able to understand and use rate of perceived exertion (RPE) scale;Increase Physical Activity  Increase Physical Activity  Increase Physical Activity     Comments  Home exercise prescription given to patient including THRR, RPE scale, and endpoints for exercise.  Patient is walking outside 10-15 minutes [facility] 1-2 [facility].  Patient has increased her walking to to 45-60 minutes every other day. Increasing workloads as tolerated. Patient has developed a long term exercise routine which includes walking and exercise at gym in addition to exercise at [facility].     Expected Outcomes  Patient will walk 30 minutes, 1-2 days/week in addition to exercise at cardiac rehab.  Continue exercise at least 30 minutes 4-5 days/week to help achieve health and fitness goals.  Patient will achieve improved fitness and decreased SOB by maintaining current exercise prescription[facility].         Discharge Exercise Prescription (Final Exercise  Prescription Changes): Exercise Prescription Changes - 03/14/18 1005      Response to Exercise   Blood Pressure (Admit)  112/68    Blood Pressure (Exercise)  128/70    Blood Pressure (Exit)  106/50    Heart Rate (Admit)  88 bpm    Heart Rate (Exercise)  124 bpm    Heart Rate (Exit)  88 bpm    Rating of Perceived Exertion (Exercise)  12    Symptoms  none    Duration  Progress to 30 minutes of  aerobic without signs/symptoms of physical distress    Intensity  THRR unchanged      Progression   Progression  Continue to progress workloads to maintain intensity without signs/symptoms of physical distress.    Average METs  3.4      Resistance Training   Training Prescription  Yes    Weight  3lbs    Reps  10-15    Time  10 Minutes      Interval Training   Interval Training  No      Treadmill   MPH  2.2    Grade  2    Minutes  10    METs  3.29      Bike   Level  0.8    Minutes  10    METs  3.21      NuStep   Level  4    SPM  85    Minutes  10    METs  3.7      Home Exercise Plan   Plans to continue exercise at  [facility] (comment)    Frequency  Add 2 additional  days to program exercise sessions.    Initial Home Exercises Provided  02/07/18       Nutrition:  Target Goals: Understanding of nutrition guidelines, daily intake of sodium 1500mg , cholesterol 200mg , calories 30% from fat and 7% or less from saturated fats, daily to have 5 or more servings of fruits and vegetables.  Biometrics: Pre Biometrics - 01/27/18 1104      Pre Biometrics   Height  5\' 2"  (1.575 m)    Weight  145 lb 15.1 oz (66.2 kg)    Waist Circumference  38 inches    Hip Circumference  39 inches    Waist to Hip Ratio  0.97 %    BMI (Calculated)  26.69    Triceps Skinfold  25 mm    % Body Fat  38.3 %    Grip Strength  30 kg    Flexibility  15 in    Single Leg Stand  30 seconds        Nutrition Therapy Plan and Nutrition Goals: Nutrition Therapy & Goals - 01/27/18 1215       Nutrition Therapy   Diet  Heart Healthy      Personal Nutrition Goals   Nutrition Goal  Pt to identify and limit food sources of saturated fat, trans fat, and sodium    Personal Goal #2  Pt to identify food quantities necessary to achieve weight loss of 6-10 lb (2.7-10.9 kg) at graduation from cardiac rehab.      Intervention Plan   Intervention  Prescribe, educate and counsel regarding individualized specific dietary modifications aiming towards targeted core components such as weight, hypertension, lipid management, diabetes, heart failure and other comorbidities.;Nutrition handout(s) given to patient. Healthy Eating    Expected Outcomes  Short Term Goal: Understand basic principles of dietary content, such as calories, fat, sodium, cholesterol and nutrients.;Long Term Goal: Adherence to prescribed nutrition plan.       Nutrition Assessments: Nutrition Assessments - 02/11/18 1029      MEDFICTS Scores   Pre Score  15       Nutrition Goals Re-Evaluation:   Nutrition Goals Re-Evaluation:   Nutrition Goals Discharge (Final Nutrition Goals Re-Evaluation):   Psychosocial: Target Goals: Acknowledge presence or absence of significant depression and/or stress, maximize coping skills, provide positive support system. Participant is able to verbalize types and ability to use techniques and skills needed for reducing stress and depression.  Initial Review & Psychosocial Screening: Initial Psych Review & Screening - 02/02/18 1621      Initial Review   Current issues with  None Identified      Family Dynamics   Good Support System?  Yes Husband and other family members      Barriers   Psychosocial barriers to participate in program  There are no identifiable barriers or psychosocial needs.      Screening Interventions   Interventions  Encouraged to exercise Pt brought in homework to session today.    Expected Outcomes  Short Term goal: Identification and review with participant of  any Quality of Life or Depression concerns found by scoring the questionnaire.;Long Term goal: The participant improves quality of Life and PHQ9 Scores as seen by post scores and/or verbalization of changes       Quality of Life Scores: Quality of Life - 02/02/18 1115      Quality of Life Scores   Health/Function Pre  24.18 %    Socioeconomic Pre  25 %    Psych/Spiritual Pre  28.93 %    Family Pre  29.38 %    GLOBAL Pre  26.05 %      Scores of 19 and below usually indicate a poorer quality of life in these areas.  A difference of  2-3 points is a clinically meaningful difference.  A difference of 2-3 points in the total score of the Quality of Life Index has been associated with significant improvement in overall quality of life, self-image, physical symptoms, and general health in studies assessing change in quality of life.  PHQ-9: Recent Review Flowsheet Data    Depression screen [facility] 2/9 01/19/2018 12/08/2017 04/13/2017 10/12/2016 08/19/2016   Decreased Interest 0 0 0 0 0   Down, Depressed, Hopeless 0 0 0 0 0   PHQ - 2 Score 0 0 0 0 0     Interpretation of Total Score  Total Score Depression Severity:  1-4 = Minimal depression, 5-9 = Mild depression, 10-14 = Moderate depression, 15-19 = Moderately severe depression, 20-27 = Severe depression   Psychosocial Evaluation and Intervention:   Psychosocial Re-Evaluation: Psychosocial Re-Evaluation    Row Name 02/22/18 1520 03/22/18 1717           Psychosocial Re-Evaluation   Current issues with  None Identified  None Identified      Interventions  Encouraged to attend Cardiac Rehabilitation for the exercise  Encouraged to attend Cardiac Rehabilitation for the exercise      Continue Psychosocial Services   No Follow up required  No Follow up required         Psychosocial Discharge (Final Psychosocial Re-Evaluation): Psychosocial Re-Evaluation - 03/22/18 1717      Psychosocial Re-Evaluation   Current issues with  None  Identified    Interventions  Encouraged to attend Cardiac Rehabilitation for the exercise    Continue Psychosocial Services   No Follow up required       Vocational Rehabilitation: Provide vocational rehab assistance to qualifying candidates.   Vocational Rehab Evaluation & Intervention: Vocational Rehab - 02/02/18 1618      Initial Vocational Rehab Evaluation & Intervention   Assessment shows need for Vocational Rehabilitation  No       Education: Education Goals: Education classes will be provided on a weekly basis, covering required topics. Participant will state understanding/return demonstration of topics presented.  Learning Barriers/Preferences: Learning Barriers/Preferences - 01/27/18 0951      Learning Barriers/Preferences   Learning Barriers  Sight    Learning Preferences  Skilled Demonstration;Verbal Instruction;Written Material;Video       Education Topics: Count Your Pulse:  -Group instruction provided by verbal instruction, demonstration, patient participation and written materials to support subject.  Instructors address importance of being able to find your pulse and how to count your pulse when at home without a heart monitor.  Patients get hands on experience counting their pulse with staff help and individually.   Heart Attack, Angina, and Risk Factor Modification:  -Group instruction provided by verbal instruction, video, and written materials to support subject.  Instructors address signs and symptoms of angina and heart attacks.    Also discuss risk factors for heart disease and how to make changes to improve heart health risk factors.   Functional Fitness:  -Group instruction provided by verbal instruction, demonstration, patient participation, and written materials to support subject.  Instructors address safety measures for doing things around the house.  Discuss how to get up and down off the floor, how to pick things up properly, how to safely get  out  of a chair without assistance, and balance training.   Meditation and Mindfulness:  -Group instruction provided by verbal instruction, patient participation, and written materials to support subject.  Instructor addresses importance of mindfulness and meditation practice to help reduce stress and improve awareness.  Instructor also leads participants through a meditation exercise.    Stretching for Flexibility and Mobility:  -Group instruction provided by verbal instruction, patient participation, and written materials to support subject.  Instructors lead participants through series of stretches that are designed to increase flexibility thus improving mobility.  These stretches are additional exercise for major muscle groups that are typically performed during regular warm up and cool down.   Hands Only CPR:  -Group verbal, video, and participation provides a basic overview of AHA guidelines for community CPR. Role-play of emergencies allow participants the opportunity to practice calling for help and chest compression technique with discussion of AED use.   Hypertension: -Group verbal and written instruction that provides a basic overview of hypertension including the most recent diagnostic guidelines, risk factor reduction with self-care instructions and medication management.   CARDIAC REHAB PHASE II EXERCISE from 02/16/2018 in [facility] CARDIAC REHAB  Date  02/04/18  Instruction Review Code  2- Demonstrated Understanding       Nutrition I class: Heart Healthy Eating:  -Group instruction provided by PowerPoint slides, verbal discussion, and written materials to support subject matter. The instructor gives an explanation and review of the Therapeutic Lifestyle Changes diet recommendations, which includes a discussion on lipid goals, dietary fat, sodium, fiber, plant stanol/sterol esters, sugar, and the components of a well-balanced, healthy diet.   Nutrition II class:  Lifestyle Skills:  -Group instruction provided by PowerPoint slides, verbal discussion, and written materials to support subject matter. The instructor gives an explanation and review of label reading, grocery shopping for heart health, heart healthy recipe modifications, and ways to make healthier choices when eating out.   Diabetes Question & Answer:  -Group instruction provided by PowerPoint slides, verbal discussion, and written materials to support subject matter. The instructor gives an explanation and review of diabetes co-morbidities, pre- and post-prandial blood glucose goals, pre-exercise blood glucose goals, signs, symptoms, and treatment of hypoglycemia and hyperglycemia, and foot care basics.   Diabetes Blitz:  -Group instruction provided by PowerPoint slides, verbal discussion, and written materials to support subject matter. The instructor gives an explanation and review of the physiology behind type 1 and type 2 diabetes, diabetes medications and rational behind using different medications, pre- and post-prandial blood glucose recommendations and Hemoglobin A1c goals, diabetes diet, and exercise including blood glucose guidelines for exercising safely.    Portion Distortion:  -Group instruction provided by PowerPoint slides, verbal discussion, written materials, and food models to support subject matter. The instructor gives an explanation of serving size versus portion size, changes in portions sizes over the last 20 years, and what consists of a serving from each food group.   CARDIAC REHAB PHASE II EXERCISE from 02/16/2018 in [facility] CARDIAC REHAB  Date  02/02/18  Educator  RD  Instruction Review Code  2- Demonstrated Understanding      Stress Management:  -Group instruction provided by verbal instruction, video, and written materials to support subject matter.  Instructors review role of stress in heart disease and how to cope with stress positively.      Exercising on Your Own:  -Group instruction provided by verbal instruction, power point, and written materials to support subject.  Instructors discuss benefits of exercise, components of exercise, frequency and intensity of exercise, and end points for exercise.  Also discuss use of nitroglycerin and activating EMS.  Review options of places to exercise outside of rehab.  Review guidelines for sex with heart disease.   Cardiac Drugs I:  -Group instruction provided by verbal instruction and written materials to support subject.  Instructor reviews cardiac drug classes: antiplatelets, anticoagulants, beta blockers, and statins.  Instructor discusses reasons, side effects, and lifestyle considerations for each drug class.   Cardiac Drugs II:  -Group instruction provided by verbal instruction and written materials to support subject.  Instructor reviews cardiac drug classes: angiotensin converting enzyme inhibitors (ACE-I), angiotensin II receptor blockers (ARBs), nitrates, and calcium channel blockers.  Instructor discusses reasons, side effects, and lifestyle considerations for each drug class.   CARDIAC REHAB PHASE II EXERCISE from 02/16/2018 in [facility] CARDIAC REHAB  Date  02/16/18  Instruction Review Code  2- Demonstrated Understanding      Anatomy and Physiology of the Circulatory System:  Group verbal and written instruction and models provide basic cardiac anatomy and physiology, with the coronary electrical and arterial systems. Review of: AMI, Angina, Valve disease, Heart Failure, Peripheral Artery Disease, Cardiac Arrhythmia, Pacemakers, and the ICD.   Other Education:  -Group or individual verbal, written, or video instructions that support the educational goals of the cardiac rehab program.   Holiday Eating Survival Tips:  -Group instruction provided by PowerPoint slides, verbal discussion, and written materials to support subject matter. The instructor  gives patients tips, tricks, and techniques to help them not only survive but enjoy the holidays despite the onslaught of food that accompanies the holidays.   Knowledge Questionnaire Score: Knowledge Questionnaire Score - 02/02/18 1115      Knowledge Questionnaire Score   Pre Score  18/24       Core Components/Risk Factors/Patient Goals at Admission: Personal Goals and Risk Factors at Admission - 01/27/18 1103      Core Components/Risk Factors/Patient Goals on Admission    Weight Management  Yes    Intervention  Weight Management: Develop a combined nutrition and exercise program designed to reach desired caloric intake, while maintaining appropriate intake of nutrient and fiber, sodium and fats, and appropriate energy expenditure required for the weight goal.;Weight Management: Provide education and appropriate resources to help participant work on and attain dietary goals.    Admit Weight  145 lb 10.2 oz (66.1 kg)    Goal Weight: Short Term  140 lb (63.5 kg)    Goal Weight: Long Term  144 lb (65.3 kg)    Expected Outcomes  Short Term: Continue to assess and modify interventions until short term weight is achieved;Long Term: Adherence to nutrition and physical activity/exercise program aimed toward attainment of established weight goal;Weight Loss: Understanding of general recommendations for a balanced deficit meal plan, which promotes 1-2 lb weight loss per week and includes a negative energy balance of [phone] kcal/d;Understanding recommendations for meals to include 15-35% energy as protein, 25-35% energy from fat, 35-60% energy from carbohydrates, less than 200mg  of dietary cholesterol, 20-35 gm of total fiber daily;Understanding of distribution of calorie intake throughout the day with the consumption of 4-5 meals/snacks    Hypertension  Yes    Intervention  Provide education on lifestyle modifcations including regular physical activity/exercise, weight management, moderate sodium  restriction and increased consumption of fresh fruit, vegetables, and low fat dairy, alcohol moderation, and smoking cessation.;Monitor prescription use compliance.  Expected Outcomes  Short Term: Continued assessment and intervention until BP is < 140/97mm HG in hypertensive participants. < 130/62mm HG in hypertensive participants with diabetes, heart failure or chronic kidney disease.;Long Term: Maintenance of blood pressure at goal levels.    Lipids  Yes    Intervention  Provide education and support for participant on nutrition & aerobic/resistive exercise along with prescribed medications to achieve LDL 70mg , HDL >40mg .    Expected Outcomes  Long Term: Cholesterol controlled with medications as prescribed, with individualized exercise RX and with personalized nutrition plan. Value goals: LDL < , HDL > 40 mg.;Short Term: Participant states understanding of desired cholesterol values and is compliant with medications prescribed. Participant is following exercise prescription and nutrition guidelines.       Core Components/Risk Factors/Patient Goals Review:  Goals and Risk Factor Review    Row Name 02/22/18 1517 03/22/18 1717           Core Components/Risk Factors/Patient Goals Review   Personal Goals Review  Weight Management/Obesity;Lipids;Hypertension  Weight Management/Obesity;Lipids;Hypertension      Review  Alyene's vital signs have been stable at phase 2 cardiac rehab. Solimar is doing well with exercise.  Aricka's vital signs have been stable at phase 2 cardiac rehab. Anakaren is doing well with exercise.      Expected Outcomes  Banesa will continue to take her medications as prescribed and partcipate in phase 2 cardiac rehab   Ashantia will continue to take her medications as prescribed and partcipate in phase 2 cardiac rehab          Core Components/Risk Factors/Patient Goals at Discharge (Final Review):  Goals and Risk Factor Review - 03/22/18 1717      Core Components/Risk  Factors/Patient Goals Review   Personal Goals Review  Weight Management/Obesity;Lipids;Hypertension    Review  Titiana's vital signs have been stable at phase 2 cardiac rehab. Verlean is doing well with exercise.    Expected Outcomes  Tyiesha will continue to take her medications as prescribed and partcipate in phase 2 cardiac rehab        ITP Comments: ITP Comments    Row Name 01/27/18 0949 02/22/18 1515 03/22/18 1717       ITP Comments  Dr. Armanda Magic, Medical Director   30 day ITP Review. Patient with good attendance and participation in phase 2 cardiac rehab.  30 day ITP Review. Patient with good attendance and participation in phase 2 cardiac rehab.        Comments: See ITP comments.Gladstone Lighter, RN,BSN 03/22/2018 5:19 PM

## 2018-03-23 ENCOUNTER — Encounter (HOSPITAL_COMMUNITY)
Admission: RE | Admit: 2018-03-23 | Discharge: 2018-03-23 | Disposition: A | Discharge status: Home / Self Care | Payer: Medicare HMO | Source: Ambulatory Visit | Attending: Cardiovascular Disease | Admitting: Cardiovascular Disease

## 2018-03-23 DIAGNOSIS — Z951 Presence of aortocoronary bypass graft: Secondary | ICD-10-CM | POA: Diagnosis not present

## 2018-03-23 DIAGNOSIS — Z48812 Encounter for surgical aftercare following surgery on the circulatory system: Secondary | ICD-10-CM | POA: Diagnosis not present

## 2018-03-24 ENCOUNTER — Other Ambulatory Visit: Payer: Self-pay | Admitting: Cardiovascular Disease

## 2018-03-24 ENCOUNTER — Other Ambulatory Visit: Payer: Self-pay | Admitting: Physician Assistant

## 2018-03-24 NOTE — Telephone Encounter (Signed)
New Message:        *STAT* If patient is at the pharmacy, call can be transferred to refill team.   1. Which medications need to be refilled? (please list name of each medication and dose if known) atorvastatin (LIPITOR) 20 MG tablet  Metoprolol Tartrate 37.5 MG TABS  2. Which pharmacy/location (including street and city if local pharmacy) is medication to be sent to?[facility] [phone] [facility] - [place], [place] - [address]  3. Do they need a 30 day or 90 day supply? 30

## 2018-03-25 ENCOUNTER — Other Ambulatory Visit: Payer: Self-pay

## 2018-03-25 ENCOUNTER — Encounter (HOSPITAL_COMMUNITY)
Admission: RE | Admit: 2018-03-25 | Discharge: 2018-03-25 | Disposition: A | Discharge status: Home / Self Care | Payer: Medicare HMO | Source: Ambulatory Visit | Attending: Cardiology | Admitting: Cardiology

## 2018-03-25 DIAGNOSIS — Z951 Presence of aortocoronary bypass graft: Secondary | ICD-10-CM | POA: Diagnosis not present

## 2018-03-25 DIAGNOSIS — Z48812 Encounter for surgical aftercare following surgery on the circulatory system: Secondary | ICD-10-CM | POA: Diagnosis not present

## 2018-03-25 MED ORDER — ATORVASTATIN CALCIUM 20 MG PO TABS: 20.0000 mg | ORAL_TABLET | Freq: Every day | ORAL | 0 refills | Status: DC

## 2018-03-25 MED ORDER — METOPROLOL TARTRATE 37.5 MG PO TABS: 1.0000 | ORAL_TABLET | Freq: Two times a day (BID) | ORAL | 0 refills | Status: DC

## 2018-03-25 MED ORDER — ATORVASTATIN CALCIUM 20 MG PO TABS: 20.0000 mg | ORAL_TABLET | Freq: Every day | ORAL | 3 refills | Status: DC

## 2018-03-25 MED ORDER — METOPROLOL TARTRATE 37.5 MG PO TABS: 1.0000 | ORAL_TABLET | Freq: Two times a day (BID) | ORAL | 3 refills | Status: DC

## 2018-03-25 NOTE — Telephone Encounter (Signed)
Rx request sent to pharmacy.  

## 2018-03-28 ENCOUNTER — Encounter (HOSPITAL_COMMUNITY)
Admission: RE | Admit: 2018-03-28 | Discharge: 2018-03-28 | Disposition: A | Discharge status: Home / Self Care | Payer: Medicare HMO | Source: Ambulatory Visit | Attending: Cardiovascular Disease | Admitting: Cardiovascular Disease

## 2018-03-28 DIAGNOSIS — Z951 Presence of aortocoronary bypass graft: Secondary | ICD-10-CM | POA: Diagnosis not present

## 2018-03-28 DIAGNOSIS — Z48812 Encounter for surgical aftercare following surgery on the circulatory system: Secondary | ICD-10-CM | POA: Diagnosis not present

## 2018-03-30 ENCOUNTER — Encounter (HOSPITAL_COMMUNITY)
Admission: RE | Admit: 2018-03-30 | Discharge: 2018-03-30 | Disposition: A | Discharge status: Home / Self Care | Payer: Medicare HMO | Source: Ambulatory Visit | Attending: Cardiovascular Disease | Admitting: Cardiovascular Disease

## 2018-03-30 DIAGNOSIS — Z951 Presence of aortocoronary bypass graft: Secondary | ICD-10-CM | POA: Diagnosis not present

## 2018-03-30 DIAGNOSIS — Z48812 Encounter for surgical aftercare following surgery on the circulatory system: Secondary | ICD-10-CM | POA: Diagnosis not present

## 2018-04-01 ENCOUNTER — Encounter (HOSPITAL_COMMUNITY)
Admission: RE | Admit: 2018-04-01 | Discharge: 2018-04-01 | Disposition: A | Discharge status: Home / Self Care | Payer: Medicare HMO | Source: Ambulatory Visit | Attending: Cardiovascular Disease | Admitting: Cardiovascular Disease

## 2018-04-01 DIAGNOSIS — Z951 Presence of aortocoronary bypass graft: Secondary | ICD-10-CM | POA: Diagnosis not present

## 2018-04-01 DIAGNOSIS — Z48812 Encounter for surgical aftercare following surgery on the circulatory system: Secondary | ICD-10-CM | POA: Diagnosis not present

## 2018-04-06 ENCOUNTER — Encounter (HOSPITAL_COMMUNITY)
Admission: RE | Admit: 2018-04-06 | Discharge: 2018-04-06 | Disposition: A | Discharge status: Home / Self Care | Payer: Medicare HMO | Source: Ambulatory Visit | Attending: Cardiovascular Disease | Admitting: Cardiovascular Disease

## 2018-04-06 DIAGNOSIS — Z951 Presence of aortocoronary bypass graft: Secondary | ICD-10-CM

## 2018-04-06 DIAGNOSIS — Z48812 Encounter for surgical aftercare following surgery on the circulatory system: Secondary | ICD-10-CM | POA: Diagnosis not present

## 2018-04-08 ENCOUNTER — Encounter (HOSPITAL_COMMUNITY)
Admission: RE | Admit: 2018-04-08 | Discharge: 2018-04-08 | Disposition: A | Discharge status: Home / Self Care | Payer: Medicare HMO | Source: Ambulatory Visit | Attending: Cardiovascular Disease | Admitting: Cardiovascular Disease

## 2018-04-08 DIAGNOSIS — Z48812 Encounter for surgical aftercare following surgery on the circulatory system: Secondary | ICD-10-CM | POA: Diagnosis not present

## 2018-04-08 DIAGNOSIS — Z951 Presence of aortocoronary bypass graft: Secondary | ICD-10-CM | POA: Diagnosis not present

## 2018-04-11 ENCOUNTER — Ambulatory Visit: Payer: Medicare HMO | Admitting: Thoracic Surgery (Cardiothoracic Vascular Surgery)

## 2018-04-11 ENCOUNTER — Encounter (HOSPITAL_COMMUNITY)
Admission: RE | Admit: 2018-04-11 | Discharge: 2018-04-11 | Disposition: A | Discharge status: Home / Self Care | Payer: Medicare HMO | Source: Ambulatory Visit | Attending: Cardiovascular Disease | Admitting: Cardiovascular Disease

## 2018-04-11 ENCOUNTER — Other Ambulatory Visit: Payer: Self-pay

## 2018-04-11 ENCOUNTER — Encounter: Payer: Self-pay | Admitting: Thoracic Surgery (Cardiothoracic Vascular Surgery)

## 2018-04-11 VITALS — BP 106/71 | HR 61 | Resp 18 | Ht 61.0 in | Wt 149.8 lb

## 2018-04-11 DIAGNOSIS — Z951 Presence of aortocoronary bypass graft: Secondary | ICD-10-CM | POA: Diagnosis not present

## 2018-04-11 DIAGNOSIS — Z48812 Encounter for surgical aftercare following surgery on the circulatory system: Secondary | ICD-10-CM | POA: Diagnosis not present

## 2018-04-11 NOTE — Progress Notes (Signed)
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  Her early postoperative recovery was uneventful and she was last seen here in our office on January 04, 2018 at which time she was doing well.  Since then she has enrolled in been participating in the outpatient cardiac rehab program.  She was seen in follow-up by Dr. Allyson SabalBerry on March 08, 2018 and she returns to our office today for routine follow-up.  She reports that she is doing very well.  Her exercise tolerance has gradually improved.  She is walking nearly every day.  She denies any symptoms of exertional shortness of breath or chest discomfort and she reports that she feels considerably better than she did prior to her surgery.  Overall she has no complaints.   Current Outpatient Medications  Medication Sig Dispense Refill  . acetaminophen (TYLENOL) 500 MG tablet Take 2 tablets (1,000 mg total) by mouth every 6 (six) hours as needed. 30 tablet 0  . aspirin EC 325 MG EC tablet Take 1 tablet (325 mg total) by mouth daily. 30 tablet 0  . atorvastatin (LIPITOR) 20 MG tablet Take 1 tablet (20 mg total) by mouth daily at 6 PM. 90 tablet 3  . furosemide (LASIX) 40 MG tablet Take 1 tablet (40 mg total) by mouth daily. 90 tablet 0  . GENVOYA 150-150-200-10 MG TABS tablet TAKE 1 TABLET BY MOUTH DAILY WITH BREAKFAST 30 tablet 5  . Metoprolol Tartrate 37.5 MG TABS Take 1 tablet by mouth 2 (two) times daily. 180 tablet 3  . oxyCODONE (OXY IR/ROXICODONE) 5 MG immediate release tablet Take 1 tablet (5 mg total) by mouth every 4 (four) hours as needed  for severe pain. 30 tablet 0  . potassium chloride SA (K-DUR,KLOR-CON) 20 MEQ tablet Take 1 tablet (20 mEq total) by mouth daily. 7 tablet 0  . PREZISTA 800 MG tablet TAKE 1 TABLET(800 MG) BY MOUTH DAILY 30 tablet 5   No current facility-administered medications for this visit.       Physical Exam:   BP 106/71 (BP Location: Right Arm, Patient Position: Sitting, Cuff Size: Normal)   Pulse 61   Resp 18   Ht 5\' 1"  (1.549 m)   Wt 149 lb 12.8 oz (67.9 kg)   SpO2 99% Comment: RA  BMI 28.30 kg/m   General:  Well-appearing  Chest:   Clear to auscultation  CV:   Regular rate and rhythm without murmur  Incisions:  Well-healed, sternum stable  Abdomen:  Soft nontender  Extremities:  Warm and well-perfused  Diagnostic Tests:  n/a   Impression:  Patient doing very well more than 3 months status post coronary artery bypass grafting  Plan:  We have not recommended any change the patient's current medications.  I have encouraged the patient to continue to gradually increase her physical activity without any particular limitations at this time.  The patient has been reminded regarding the many benefits of a heart healthy diet and regular exercise.  All of her questions have been  addressed.  Patient will return to our office next February, approximately 1 year following her surgery.  She will call and return sooner only should specific problems or questions arise.  I spent in excess of 10 minutes during the conduct of this office consultation and >50% of this time involved direct face-to-face encounter with the patient for counseling and/or coordination of their care.    Salvatore Decent. Cornelius Moras, MD 04/11/2018 12:54 PM

## 2018-04-11 NOTE — Patient Instructions (Addendum)
Continue all previous medications without any changes at this time  You may resume unrestricted physical activity without any particular limitations at this time.  Make every effort to maintain a "heart-healthy" lifestyle with regular physical exercise and adherence to a low-fat, low-carbohydrate diet.  Continue to seek regular follow-up appointments with your primary care physician and/or cardiologist. 

## 2018-04-13 ENCOUNTER — Encounter (HOSPITAL_COMMUNITY)
Admission: RE | Admit: 2018-04-13 | Discharge: 2018-04-13 | Disposition: A | Discharge status: Home / Self Care | Payer: Medicare HMO | Source: Ambulatory Visit | Attending: Cardiovascular Disease | Admitting: Cardiovascular Disease

## 2018-04-13 DIAGNOSIS — Z48812 Encounter for surgical aftercare following surgery on the circulatory system: Secondary | ICD-10-CM | POA: Diagnosis not present

## 2018-04-13 DIAGNOSIS — Z951 Presence of aortocoronary bypass graft: Secondary | ICD-10-CM | POA: Diagnosis not present

## 2018-04-15 ENCOUNTER — Encounter (HOSPITAL_COMMUNITY)
Admission: RE | Admit: 2018-04-15 | Discharge: 2018-04-15 | Disposition: A | Discharge status: Home / Self Care | Payer: Medicare HMO | Source: Ambulatory Visit | Attending: Cardiovascular Disease | Admitting: Cardiovascular Disease

## 2018-04-15 DIAGNOSIS — Z951 Presence of aortocoronary bypass graft: Secondary | ICD-10-CM | POA: Diagnosis not present

## 2018-04-15 DIAGNOSIS — Z48812 Encounter for surgical aftercare following surgery on the circulatory system: Secondary | ICD-10-CM | POA: Diagnosis not present

## 2018-04-18 ENCOUNTER — Encounter (HOSPITAL_COMMUNITY)
Admission: RE | Admit: 2018-04-18 | Discharge: 2018-04-18 | Disposition: A | Discharge status: Home / Self Care | Payer: Medicare HMO | Source: Ambulatory Visit | Attending: Cardiovascular Disease | Admitting: Cardiovascular Disease

## 2018-04-18 DIAGNOSIS — Z48812 Encounter for surgical aftercare following surgery on the circulatory system: Secondary | ICD-10-CM | POA: Diagnosis not present

## 2018-04-18 DIAGNOSIS — Z951 Presence of aortocoronary bypass graft: Secondary | ICD-10-CM | POA: Diagnosis not present

## 2018-04-20 ENCOUNTER — Other Ambulatory Visit: Payer: Self-pay | Admitting: Cardiovascular Disease

## 2018-04-20 ENCOUNTER — Encounter (HOSPITAL_COMMUNITY)
Admission: RE | Admit: 2018-04-20 | Discharge: 2018-04-20 | Disposition: A | Discharge status: Home / Self Care | Payer: Medicare HMO | Source: Ambulatory Visit | Attending: Cardiovascular Disease | Admitting: Cardiovascular Disease

## 2018-04-20 ENCOUNTER — Ambulatory Visit (INDEPENDENT_AMBULATORY_CARE_PROVIDER_SITE_OTHER): Payer: Medicare HMO | Admitting: Family Medicine

## 2018-04-20 ENCOUNTER — Encounter: Payer: Self-pay | Admitting: Family Medicine

## 2018-04-20 VITALS — BP 132/80 | HR 80 | Temp 97.9°F | Ht 61.0 in | Wt 151.0 lb

## 2018-04-20 DIAGNOSIS — I10 Essential (primary) hypertension: Secondary | ICD-10-CM

## 2018-04-20 DIAGNOSIS — R5383 Other fatigue: Secondary | ICD-10-CM

## 2018-04-20 DIAGNOSIS — Z09 Encounter for follow-up examination after completed treatment for conditions other than malignant neoplasm: Secondary | ICD-10-CM | POA: Diagnosis not present

## 2018-04-20 DIAGNOSIS — Z131 Encounter for screening for diabetes mellitus: Secondary | ICD-10-CM

## 2018-04-20 DIAGNOSIS — I25119 Atherosclerotic heart disease of native coronary artery with unspecified angina pectoris: Secondary | ICD-10-CM | POA: Diagnosis not present

## 2018-04-20 DIAGNOSIS — Z951 Presence of aortocoronary bypass graft: Secondary | ICD-10-CM

## 2018-04-20 DIAGNOSIS — Z48812 Encounter for surgical aftercare following surgery on the circulatory system: Secondary | ICD-10-CM | POA: Diagnosis not present

## 2018-04-20 LAB — POCT GLYCOSYLATED HEMOGLOBIN (HGB A1C): Hemoglobin A1C: 5.7 % — AB (ref 4.0–5.6)

## 2018-04-20 LAB — POCT URINALYSIS DIP (MANUAL ENTRY)
Bilirubin, UA: NEGATIVE
Blood, UA: NEGATIVE
Glucose, UA: NEGATIVE mg/dL
Ketones, POC UA: NEGATIVE mg/dL
Leukocytes, UA: NEGATIVE
Nitrite, UA: NEGATIVE
Protein Ur, POC: NEGATIVE mg/dL
Spec Grav, UA: 1.01 (ref 1.010–1.025)
Urobilinogen, UA: 0.2 E.U./dL
pH, UA: 5 (ref 5.0–8.0)

## 2018-04-20 NOTE — Telephone Encounter (Signed)
Pharmacy calling stating that Dr. Allyson SabalBerry order Metoprolol 37.5 mg tablet and this medication is not available in this strength. Pharmacy wanted to know if Dr. Allyson SabalBerry would like to order a alternative medication or a different strength of Metoprolol. Please call back to address [phone]

## 2018-04-20 NOTE — Progress Notes (Signed)
Subjective:    Patient ID: Cathy Smith, female    DOB: 21-Mar-1966, 52 y.o.   MRN: [id number]  PCP: Raliegh Ip, NP  Chief Complaint  Patient presents with  . Follow-up    6 month on chronic conditon     HPI  Ms. Heberlein has a history of Hypertriglyceridemia, Hypertension, HIV, and CAD. She is here today for follow up.   Current Status: She is doing well with no complaints. She denies fevers, chills, fatigue, recent infections, weight loss, and night sweats. She has not had any headaches, visual changes, dizziness, and falls. No chest pain, heart palpitations, cough and shortness of breath reported. No reports of GI problems. She has no reports of blood in stools, dysuria and hematuria. No depression or anxiety. She has no pain today.   She will complete 8/8 sessions of Cardiac Rehab next Wednesday.     Past Medical History:  Diagnosis Date  . Coronary artery disease   . History of condyloma acuminatum   . History of uterine fibroid   . History of vaginal dysplasia    VAIN III--  oncologist-  dr Andrey Farmer  . History of vulvar dysplasia    VIN III   gyn-oncologist-  dr Andrey Farmer  . HIV infection ([facility]-  dr hatcher  . Hypertension   . Hypertriglyceridemia without hypercholesterolemia   . PONV (postoperative nausea and vomiting)   . VIN III (vulvar intraepithelial neoplasia III)   . Wears glasses     Family History  Problem Relation Age of Onset  . Cataracts Mother   . Hypertension Father   . CAD Paternal Grandfather   . Colon cancer Neg Hx    Social History   Socioeconomic History  . Marital status: Married    Spouse name: Not on file  . Number of children: Not on file  . Years of education: Not on file  . Highest education level: Not on file  Occupational History  . Not on file  Social Needs  . Financial resource strain: Not on file  . Food insecurity:    Worry: Not on file    Inability: Not on file  . Transportation  needs:    Medical: Not on file    Non-medical: Not on file  Tobacco Use  . Smoking status: Never Smoker  . Smokeless tobacco: Never Used  Substance and Sexual Activity  . Alcohol use: No    Alcohol/week: 0.0 oz  . Drug use: No  . Sexual activity: Yes    Partners: Male    Birth control/protection: Condom    Comment: pt. given condoms  Lifestyle  . Physical activity:    Days per week: Not on file    Minutes per session: Not on file  . Stress: Not on file  Relationships  . Social connections:    Talks on phone: Not on file    Gets together: Not on file    Attends religious service: Not on file    Active member of club or organization: Not on file    Attends meetings of clubs or organizations: Not on file    Relationship status: Not on file  . Intimate partner violence:    Fear of current or ex partner: Not on file    Emotionally abused: Not on file    Physically abused: Not on file    Forced sexual activity: Not on file  Other Topics Concern  . Not on file  Social  History Narrative  . Not on file    Past Surgical History:  Procedure Laterality Date  . ABDOMINAL HYSTERECTOMY  1995 approx  . ANTERIOR CERVICAL DECOMP/DISCECTOMY FUSION  11-30-2009   C4 -- C6  . CO2 LASER APPLICATION Bilateral 07/23/2015   Procedure: BILATERAL CO2 LASER ABLATION OF THE VULVA;  Surgeon: Adolphus BirchwoodEmma Rossi, MD;  [facility] [place];  Service: Gynecology;  Laterality: Bilateral;  . CO2 LASER APPLICATION N/A 02/25/2016   Procedure: CO2 LASER OF THE VULVAR;  Surgeon: Adolphus BirchwoodEmma Rossi, MD;  [facility] [place];  Service: Gynecology;  Laterality: N/A;  . CO2 LASER APPLICATION N/A 05/04/2017   Procedure: CO2 LASER VAPORIZATION OF THE VULVA;  Surgeon: Adolphus Birchwoodossi, Emma, MD;  [facility] [place];  Service: Gynecology;  Laterality: N/A;  . COLONOSCOPY  01-28-2015  . CORONARY ARTERY BYPASS GRAFT N/A 11/25/2017   Procedure: CORONARY ARTERY BYPASS GRAFTING (CABG), ON PUMP,  TIMES THREE, USING LEFT INTERNAL MAMMARY ARTERY AND ENDOSCOPICALLY HARVESTED LEFT GREATER SAPHENOUS VEIN;  Surgeon: Purcell Nailswen, Clarence H, MD;  [facility];  Service: Open Heart Surgery;  Laterality: N/A;  . I & D LEFT BUTTOCK ABSCESS  04-28-2001  . LEFT HEART CATH AND CORONARY ANGIOGRAPHY N/A 11/22/2017   Procedure: LEFT HEART CATH AND CORONARY ANGIOGRAPHY;  Surgeon: Runell GessBerry, Jonathan J, MD;  Location: [facility] INVASIVE [facility] LAB;  Service: Cardiovascular;  Laterality: N/A;  . TEE WITHOUT CARDIOVERSION N/A 11/25/2017   Procedure: TRANSESOPHAGEAL ECHOCARDIOGRAM (TEE);  Surgeon: Purcell Nailswen, Clarence H, MD;  [facility];  Service: Open Heart Surgery;  Laterality: N/A;  . VULVECTOMY Right 05/06/2015   Procedure: WIDE LOCAL  EXCISION  OF RIGHT VULVA;  Surgeon: Adolphus BirchwoodEmma Rossi, MD;  [facility] [place];  Service: Gynecology;  Laterality: Right;  Marland Kitchen. VULVECTOMY N/A 05/04/2017   Procedure: WIDE EXCISION VULVECTOMY;  Surgeon: Adolphus Birchwoodossi, Emma, MD;  [facility] [place];  Service: Gynecology;  Laterality: N/A;    Immunization History  Administered Date(s) Administered  . H1N1 10/18/2008  . Hepatitis B 01/13/2001, 02/02/2001, 03/02/2001, 11/16/2001  . Hepatitis B, adult 02/05/2014, 08/08/2014, 02/11/2015  . Influenza Split 09/02/2011, 12/20/2012  . Influenza Whole 08/30/2006, 08/03/2007, 09/24/2008, 08/07/2009, 09/11/2010  . Influenza,inj,Quad PF,6+ Mos 08/02/2013, 08/08/2014, 08/27/2015, 08/19/2016, 09/20/2017  . Meningococcal Mcv4o 04/21/2017  . Pneumococcal Conjugate-13 01/13/2016  . Pneumococcal Polysaccharide-23 08/30/2006, 12/31/2010  . Tdap 09/06/2014    Current Meds  Medication Sig  . acetaminophen (TYLENOL) 500 MG tablet Take 2 tablets (1,000 mg total) by mouth every 6 (six) hours as needed.  Marland Kitchen. aspirin EC 325 MG EC tablet Take 1 tablet (325 mg total) by mouth daily.  Marland Kitchen. atorvastatin (LIPITOR) 20 MG tablet Take 1 tablet (20 mg total) by mouth daily at 6 PM.  . furosemide (LASIX) 40 MG  tablet Take 1 tablet (40 mg total) by mouth daily.  . GENVOYA 150-150-200-10 MG TABS tablet TAKE 1 TABLET BY MOUTH DAILY WITH BREAKFAST  . Metoprolol Tartrate 37.5 MG TABS Take 1 tablet by mouth 2 (two) times daily.  . potassium chloride SA (K-DUR,KLOR-CON) 20 MEQ tablet Take 1 tablet (20 mEq total) by mouth daily.  Marland Kitchen. PREZISTA 800 MG tablet TAKE 1 TABLET(800 MG) BY MOUTH DAILY    No Known Allergies  BP 132/80 (BP Location: Left Arm, Patient Position: Sitting, Cuff Size: Large)   Pulse 80   Temp 97.9 F (36.6 C) (Oral)   Ht 5\' 1"  (1.549 m)   Wt 151 lb (68.5 kg)   SpO2 100%  BMI 28.53 kg/m    Review of Systems  Constitutional: Negative.   HENT: Negative.   Eyes: Negative.   Respiratory: Negative.   Cardiovascular: Negative.   Gastrointestinal: Negative.   Endocrine: Negative.   Genitourinary: Negative.   Musculoskeletal: Negative.   Skin: Negative.   Allergic/Immunologic: Negative.   Neurological: Negative.   Hematological: Negative.   Psychiatric/Behavioral: Negative.       Objective:   Physical Exam  Constitutional: She is oriented to person, place, and time. She appears well-developed and well-nourished.  HENT:  Head: Normocephalic and atraumatic.  Right Ear: External ear normal.  Left Ear: External ear normal.  Nose: Nose normal.  Mouth/Throat: Oropharynx is clear and moist.  Eyes: Pupils are equal, round, and reactive to light. Conjunctivae and EOM are normal.  Neck: Neck supple.  Cardiovascular: Normal rate, regular rhythm, normal heart sounds and intact distal pulses.  Pulmonary/Chest: Effort normal and breath sounds normal.  Abdominal: Soft. Bowel sounds are normal.  Neurological: She is alert and oriented to person, place, and time.  Skin: Skin is warm and dry. Capillary refill takes less than 2 seconds.  Psychiatric: She has a normal mood and affect. Her behavior is normal. Judgment and thought content normal.  Nursing note and vitals  reviewed.  Assessment & Plan:   1. Screening for diabetes mellitus Hgb A1c is 5.7 today. Urinalysis is negative.  - POCT glycosylated hemoglobin (Hb A1C) - POCT urinalysis dipstick  2. Coronary artery disease with angina pectoris, unspecified vessel or lesion type, unspecified whether native or transplanted heart [facility]) She is not having any signs or symptoms of chest pain or discomfort. Continue Lipitor and daily Aspirin. She will continue her final sessions of Cardiac Rehab.   3. Essential hypertension Blood pressure is 132/80 today. She will continue Lasix and Metoprolol as prescribed. We will continue to monitor.  4. Fatigue, unspecified type Improved.   5. Follow up She will follow up in 3 months.   No orders of the defined types were placed in this encounter.   Raliegh Ip,  MSN, FNP-BC [facility] [address] [place], [place] [address] [phone]

## 2018-04-21 NOTE — Progress Notes (Signed)
Cardiac Individual Treatment Plan  Patient Details  Name: ABEEHA TWIST MRN: [id number] Date of Birth: Apr 04, 1966 Referring Provider:     CARDIAC REHAB PHASE II ORIENTATION from 01/27/2018 in [facility] CARDIAC REHAB  Referring Provider  Nanetta Batty MD       Initial Encounter Date:    CARDIAC REHAB PHASE II ORIENTATION from 01/27/2018 in [facility] CARDIAC REHAB  Date  01/27/18  Referring Provider  Nanetta Batty MD       Visit Diagnosis: S/P CABG x 3  Patient's Home Medications on Admission:  Current Outpatient Medications:  .  acetaminophen (TYLENOL) 500 MG tablet, Take 2 tablets (1,000 mg total) by mouth every 6 (six) hours as needed., Disp: 30 tablet, Rfl: 0 .  aspirin EC 325 MG EC tablet, Take 1 tablet (325 mg total) by mouth daily., Disp: 30 tablet, Rfl: 0 .  atorvastatin (LIPITOR) 20 MG tablet, Take 1 tablet (20 mg total) by mouth daily at 6 PM., Disp: 90 tablet, Rfl: 3 .  furosemide (LASIX) 40 MG tablet, Take 1 tablet (40 mg total) by mouth daily., Disp: 90 tablet, Rfl: 0 .  GENVOYA 150-150-200-10 MG TABS tablet, TAKE 1 TABLET BY MOUTH DAILY WITH BREAKFAST, Disp: 30 tablet, Rfl: 5 .  Metoprolol Tartrate 37.5 MG TABS, Take 1 tablet by mouth 2 (two) times daily., Disp: 180 tablet, Rfl: 3 .  oxyCODONE (OXY IR/ROXICODONE) 5 MG immediate release tablet, Take 1 tablet (5 mg total) by mouth every 4 (four) hours as needed for severe pain. (Patient not taking: Reported on 04/20/2018), Disp: 30 tablet, Rfl: 0 .  potassium chloride SA (K-DUR,KLOR-CON) 20 MEQ tablet, Take 1 tablet (20 mEq total) by mouth daily. (Patient not taking: Reported on 04/20/2018), Disp: 7 tablet, Rfl: 0 .  PREZISTA 800 MG tablet, TAKE 1 TABLET(800 MG) BY MOUTH DAILY, Disp: 30 tablet, Rfl: 5  Past Medical History: Past Medical History:  Diagnosis Date  . Coronary artery disease   . History of condyloma acuminatum   . History of uterine fibroid   . History of vaginal  dysplasia    VAIN III--  oncologist-  dr Andrey Farmer  . History of vulvar dysplasia    VIN III   gyn-oncologist-  dr Andrey Farmer  . HIV infection (H[facility]) [facility]  dr hatcher  . Hypertension   . Hypertriglyceridemia without hypercholesterolemia   . PONV (postoperative nausea and vomiting)   . VIN III (vulvar intraepithelial neoplasia III)   . Wears glasses     Tobacco Use: Social History   Tobacco Use  Smoking Status Never Smoker  Smokeless Tobacco Never Used    Labs: Recent Review Flowsheet Data    Labs for ITP Cardiac and Pulmonary Rehab Latest Ref Rng & Units 11/25/2017 11/25/2017 11/26/2017 12/07/2017 04/20/2018   Cholestrol 100 - 199 mg/dL - - - 40(J) -   LDLCALC 0 - 99 mg/dL - - - 29 -   HDL >81 mg/dL - - - 19(J) -   Trlycerides 0 - 149 mg/dL - - - 478(G) -   Hemoglobin A1c 4.0 - 5.6 % - - - - 5.7(A)   PHART 7.350 - 7.450 7.291(L) - - - -   PCO2ART 32.0 - 48.0 mmHg 48.8(H) - - - -   HCO3 20.0 - 28.0 mmol/L 23.5 - - - -   TCO2 22 - 32 mmol/L 25 25 24  - -   ACIDBASEDEF 0.0 - 2.0 mmol/L 3.0(H) - - - -  O2SAT % 99.0 - - - -      Capillary Blood Glucose: Lab Results  Component Value Date   GLUCAP 108 (H) 11/29/2017   GLUCAP 132 (H) 11/28/2017   GLUCAP 109 (H) 11/28/2017   GLUCAP 124 (H) 11/28/2017   GLUCAP 115 (H) 11/28/2017     Exercise Target Goals:    Exercise Program Goal: Individual exercise prescription set using results from initial 6 min walk test and THRR while considering  patient's activity barriers and safety.   Exercise Prescription Goal: Initial exercise prescription builds to 30-45 minutes a day of aerobic activity, 2-3 days per week.  Home exercise guidelines will be given to patient during program as part of exercise prescription that the participant will acknowledge.  Activity Barriers & Risk Stratification: Activity Barriers & Cardiac Risk Stratification - 01/27/18 0951      Activity Barriers & Cardiac Risk Stratification    Activity Barriers  None    Cardiac Risk Stratification  High       6 Minute Walk: 6 Minute Walk    Row Name 01/27/18 1105 04/18/18 1037       6 Minute Walk   Phase  Initial  Discharge    Distance  1493 feet  1627 feet    Distance % Change  -  8.98 %    Walk Time  6 minutes  6 minutes    # of Rest Breaks  0  0    MPH  2.8  3.08    METS  3.8  4.18    RPE  11  12    Perceived Dyspnea   0  -    VO2 Peak  13.4  14.64    Symptoms  No  No    Resting HR  74 bpm  72 bpm    Resting BP  113/74  110/70    Resting Oxygen Saturation   99 %  -    Exercise Oxygen Saturation  during 6 min walk  100 %  -    Max Ex. HR  84 bpm  106 bpm    Max Ex. BP  124/76  114/82    2 Minute Post BP  116/78  114/70       Oxygen Initial Assessment:   Oxygen Re-Evaluation:   Oxygen Discharge (Final Oxygen Re-Evaluation):   Initial Exercise Prescription: Initial Exercise Prescription - 01/27/18 1100      Date of Initial Exercise RX and Referring Provider   Date  01/27/18    Referring Provider  [name], [name]       Treadmill   MPH  2.4    Grade  1    Minutes  10    METs  3.17      Bike   Level  0.8    Minutes  10    METs  3.02      Arm Ergometer   Level  2    Watts  35    Minutes  10    METs  3.5      Prescription Details   Frequency (times per week)  3x    Duration  Progress to 30 minutes of continuous aerobic without signs/symptoms of physical distress      Intensity   THRR 40-80% of Max Heartrate  66-133    Ratings of Perceived Exertion  11-13    Perceived Dyspnea  0-4      Progression   Progression  Continue progressive overload as per  policy without signs/symptoms or physical distress.      Resistance Training   Training Prescription  Yes    Weight  2lbs    Reps  10-15       Perform Capillary Blood Glucose checks as needed.  Exercise Prescription Changes: Exercise Prescription Changes    Row Name 02/02/18 1002 02/14/18 0950 02/28/18 0952 03/14/18 1005  03/28/18 1000     Response to Exercise   Blood Pressure (Admit)  122/72  100/68  104/72  112/68  114/60   Blood Pressure (Exercise)  122/82  122/70  110/70  128/70  118/74   Blood Pressure (Exit)  104/70  102/70  117/64  106/50  104/78   Heart Rate (Admit)  78 bpm  74 bpm  71 bpm  88 bpm  70 bpm   Heart Rate (Exercise)  111 bpm  108 bpm  93 bpm  124 bpm  103 bpm   Heart Rate (Exit)  75 bpm  72 bpm  73 bpm  88 bpm  68 bpm   Rating of Perceived Exertion (Exercise)  11  12  12  12  11    Symptoms  none  none  none  none  none   Comments  Patient arrived late, only 2 stations today.  -  -  -  -   Duration  Progress to 30 minutes of  aerobic without signs/symptoms of physical distress  Progress to 30 minutes of  aerobic without signs/symptoms of physical distress  Progress to 30 minutes of  aerobic without signs/symptoms of physical distress  Progress to 30 minutes of  aerobic without signs/symptoms of physical distress  Progress to 30 minutes of  aerobic without signs/symptoms of physical distress   Intensity  THRR unchanged  THRR unchanged  THRR unchanged  THRR unchanged  THRR unchanged     Progression   Progression  Continue to progress workloads to maintain intensity without signs/symptoms of physical distress.  Continue to progress workloads to maintain intensity without signs/symptoms of physical distress.  Continue to progress workloads to maintain intensity without signs/symptoms of physical distress.  Continue to progress workloads to maintain intensity without signs/symptoms of physical distress.  Continue to progress workloads to maintain intensity without signs/symptoms of physical distress.   Average METs  2.9  3.2  2.9  3.4  3.9     Resistance Training   Training Prescription  No Relaxation, no weights today.  Yes  Yes  Yes  Yes   Weight  -  2lbs  2lbs  3lbs  3lbs   Reps  -  10-15  10-15  10-15  10-15   Time  -  10 Minutes  10 Minutes  10 Minutes  10 Minutes     Interval Training    Interval Training  No  No  No  No  No     Treadmill   MPH  2  2  2.2  2.2  2.2   Grade  0  2  2  2  2    Minutes  10  10  10  10  10    METs  2.53  3.08  3.29  3.29  3.29     Bike   Level  0.8  0.8  0.8  0.8  0.8   Minutes  10  10  10  10  10    METs  3.28  3.24  3.21  3.21  3.19     NuStep   Level  -  3  4  4  4    SPM  -  85  85  85  85   Minutes  -  10  10  10  10    METs  -  -  2.1  3.7  5.3     Home Exercise Plan   Plans to continue exercise at  -  [employer] (comment)  [employer] (comment)  [facility] (comment[facility]  [facility] (comment)   Frequency  -  Add 2 additional days to program exercise sessions.  Add 2 additional days to program exercise sessions.  Add 2 additional days to program exercise sessions.  Add 2 additional days to program exercise sessions.   Initial Home Exercises Provided  -  02/07/18  02/07/18  02/07/18  02/07/18   Row Name 04/11/18 1000             Response to Exercise   Blood Pressure (Admit)  112/70       Blood Pressure (Exercise)  122/78       Blood Pressure (Exit)  113/78       Heart Rate (Admit)  70 bpm       Heart Rate (Exercise)  95 bpm       Heart Rate (Exit)  65 bpm       Rating of Perceived Exertion (Exercise)  12       Symptoms  none       Duration  Progress to 30 minutes of  aerobic without signs/symptoms of physical distress       Intensity  THRR unchanged         Progression   Progression  Continue to progress workloads to maintain intensity without signs/symptoms of physical distress.       Average METs  3.1         Resistance Training   Training Prescription  Yes       Weight  3lbs       Reps  10-15       Time  10 Minutes         Interval Training   Interval Training  No         Treadmill   MPH  2.2       Grade  2       Minutes  10       METs  3.29         Bike   Level  0.8       Minutes  10       METs  3.19         NuStep   Level  5       SPM  85       Minutes  10       METs  2.9          Home Exercise Plan   Plans to continue exercise at  [employer] (comment)       Frequency  Add 2 additional days to program exercise sessions.       Initial Home Exercises Provided  02/07/18          Exercise Comments: Exercise Comments    Row Name 02/02/18 1100 02/07/18 1013 02/14/18 0950 02/28/18 0952 03/16/18 1018   Exercise Comments  Off to a good start with exercise.  Home exercise guidelines reviewed with patient.  Reviewed METs and goals with patient.  Reviewed METs with patient.  Reviewed METs and goals with patient.  Row Name 03/28/18 1000 04/11/18 1032         Exercise Comments  Reviewed METs with patient.  Reviewed METs and goals with patient.         Exercise Goals and Review:   Exercise Goals Re-Evaluation : Exercise Goals Re-Evaluation    Row Name 02/07/18 1013 02/14/18 0950 03/16/18 1018 04/11/18 1000       Exercise Goal Re-Evaluation   Exercise Goals Review  Understanding of Exercise Prescription;Knowledge and understanding of Target Heart Rate Range (THRR);Able to understand and use rate of perceived exertion (RPE) scale;Increase Physical Activity  Increase Physical Activity  Increase Physical Activity  Increase Physical Activity;Increase Strength and Stamina    Comments  Home exercise prescription given to patient including THRR, RPE scale, and endpoints for exercise.  Patient is walking outside 10-15 [facility]2 [facility].  Patient has increased her walking to to 45-60 minutes every other day. Increasing workloads as tolerated. Patient has developed a long term exercise routine which includes walking and exercise at gym in addition to exercise at [facility].  [facility] but is [facility] 60 minutes every other day.    Expected Outcomes  Patient will walk 30 minutes, 1-2 days/week in addition to exercise at cardiac rehab.  Continue exercise at least 30 minutes 4-5 days/week to help achieve  health and fitness goals.  Patient will achieve improved fitness and decreased SOB by maintaining current exercise prescription[facility].  Increase workloads to help increase strength and stamina.        Discharge Exercise Prescription (Final Exercise Prescription Changes): Exercise Prescription Changes - 04/11/18 1000      Response to Exercise   Blood Pressure (Admit)  112/70    Blood Pressure (Exercise)  122/78    Blood Pressure (Exit)  113/78    Heart Rate (Admit)  70 bpm    Heart Rate (Exercise)  95 bpm    Heart Rate (Exit)  65 bpm    Rating of Perceived Exertion (Exercise)  12    Symptoms  none    Duration  Progress to 30 minutes of  aerobic without signs/symptoms of physical distress    Intensity  THRR unchanged      Progression   Progression  Continue to progress workloads to maintain intensity without signs/symptoms of physical distress.    Average METs  3.1      Resistance Training   Training Prescription  Yes    Weight  3lbs    Reps  10-15    Time  10 Minutes      Interval Training   Interval Training  No      Treadmill   MPH  2.2    Grade  2    Minutes  10    METs  3.29      Bike   Level  0.8    Minutes  10    METs  3.19      NuStep   Level  5    SPM  85    Minutes  10    METs  2.9      Home Exercise Plan   Plans to continue exercise at  [employer] (comment)    Frequency  Add 2 additional days to program exercise sessions.    Initial Home Exercises Provided  02/07/18       Nutrition:  Target  Goals: Understanding of nutrition guidelines, daily intake of sodium 1500mg , cholesterol 200mg , calories 30% from fat and 7% or less from saturated fats, daily to have 5 or more servings of fruits and vegetables.  Biometrics: Pre Biometrics - 01/27/18 1104      Pre Biometrics   Height  5\' 2"  (1.575 m)    Weight  145 lb 15.1 oz (66.2 kg)    Waist Circumference  38 inches    Hip Circumference  39 inches     Waist to Hip Ratio  0.97 %    BMI (Calculated)  26.69    Triceps Skinfold  25 mm    % Body Fat  38.3 %    Grip Strength  30 kg    Flexibility  15 in    Single Leg Stand  30 seconds      Post Biometrics - 04/18/18 1043       Post  Biometrics   Waist Circumference  34.25 inches    Hip Circumference  38.25 inches    Waist to Hip Ratio  0.9 %    Triceps Skinfold  31 mm    Grip Strength  28.5 kg    Flexibility  13 in    Single Leg Stand  5.45 seconds       Nutrition Therapy Plan and Nutrition Goals: Nutrition Therapy & Goals - 01/27/18 1215      Nutrition Therapy   Diet  Heart Healthy      Personal Nutrition Goals   Nutrition Goal  Pt to identify and limit food sources of saturated fat, trans fat, and sodium    Personal Goal #2  Pt to identify food quantities necessary to achieve weight loss of 6-10 lb (2.7-10.9 kg) at graduation from cardiac rehab.      Intervention Plan   Intervention  Prescribe, educate and counsel regarding individualized specific dietary modifications aiming towards targeted core components such as weight, hypertension, lipid management, diabetes, heart failure and other comorbidities.;Nutrition handout(s) given to patient. Healthy Eating    Expected Outcomes  Short Term Goal: Understand basic principles of dietary content, such as calories, fat, sodium, cholesterol and nutrients.;Long Term Goal: Adherence to prescribed nutrition plan.       Nutrition Assessments: Nutrition Assessments - 02/11/18 1029      MEDFICTS Scores   Pre Score  15       Nutrition Goals Re-Evaluation:   Nutrition Goals Re-Evaluation:   Nutrition Goals Discharge (Final Nutrition Goals Re-Evaluation):   Psychosocial: Target Goals: Acknowledge presence or absence of significant depression and/or stress, maximize coping skills, provide positive support system. Participant is able to verbalize types and ability to use techniques and skills needed for reducing stress and  depression.  Initial Review & Psychosocial Screening: Initial Psych Review & Screening - 02/02/18 1621      Initial Review   Current issues with  None Identified      Family Dynamics   Good Support System?  Yes Husband and other family members      Barriers   Psychosocial barriers to participate in program  There are no identifiable barriers or psychosocial needs.      Screening Interventions   Interventions  Encouraged to exercise Pt brought in homework to session today.    Expected Outcomes  Short Term goal: Identification and review with participant of any Quality of Life or Depression concerns found by scoring the questionnaire.;Long Term goal: The participant improves quality of Life and PHQ9 Scores as seen by  post scores and/or verbalization of changes       Quality of Life Scores: Quality of Life - 02/02/18 1115      Quality of Life Scores   Health/Function Pre  24.18 %    Socioeconomic Pre  25 %    Psych/Spiritual Pre  28.93 %    Family Pre  29.38 %    GLOBAL Pre  26.05 %      Scores of 19 and below usually indicate a poorer quality of life in these areas.  A difference of  2-3 points is a clinically meaningful difference.  A difference of 2-3 points in the total score of the Quality of Life Index has been associated with significant improvement in overall quality of life, self-image, physical symptoms, and general health in studies assessing change in quality of life.  PHQ-9: Recent Review Flowsheet Data    Depression screen [facility] 2/9 04/20/2018 01/19/2018 12/08/2017 04/13/2017 10/12/2016   Decreased Interest 0 0 0 0 0   Down, Depressed, Hopeless 0 0 0 0 0   PHQ - 2 Score 0 0 0 0 0     Interpretation of Total Score  Total Score Depression Severity:  1-4 = Minimal depression, 5-9 = Mild depression, 10-14 = Moderate depression, 15-19 = Moderately severe depression, 20-27 = Severe depression   Psychosocial Evaluation and Intervention:   Psychosocial  Re-Evaluation: Psychosocial Re-Evaluation    Row Name 02/22/18 1520 03/22/18 1717 04/21/18 1227         Psychosocial Re-Evaluation   Current issues with  None Identified  None Identified  None Identified     Interventions  Encouraged to attend Cardiac Rehabilitation for the exercise  Encouraged to attend Cardiac Rehabilitation for the exercise  Encouraged to attend Cardiac Rehabilitation for the exercise     Continue Psychosocial Services   No Follow up required  No Follow up required  -        Psychosocial Discharge (Final Psychosocial Re-Evaluation): Psychosocial Re-Evaluation - 04/21/18 1227      Psychosocial Re-Evaluation   Current issues with  None Identified    Interventions  Encouraged to attend Cardiac Rehabilitation for the exercise       Vocational Rehabilitation: Provide vocational rehab assistance to qualifying candidates.   Vocational Rehab Evaluation & Intervention: Vocational Rehab - 02/02/18 1618      Initial Vocational Rehab Evaluation & Intervention   Assessment shows need for Vocational Rehabilitation  No       Education: Education Goals: Education classes will be provided on a weekly basis, covering required topics. Participant will state understanding/return demonstration of topics presented.  Learning Barriers/Preferences: Learning Barriers/Preferences - 01/27/18 0951      Learning Barriers/Preferences   Learning Barriers  Sight    Learning Preferences  Skilled Demonstration;Verbal Instruction;Written Material;Video       Education Topics: Count Your Pulse:  -Group instruction provided by verbal instruction, demonstration, patient participation and written materials to support subject.  Instructors address importance of being able to find your pulse and how to count your pulse when at home without a heart monitor.  Patients get hands on experience counting their pulse with staff help and individually.   Heart Attack, Angina, and Risk Factor  Modification:  -Group instruction provided by verbal instruction, video, and written materials to support subject.  Instructors address signs and symptoms of angina and heart attacks.    Also discuss risk factors for heart disease and how to make changes to improve heart health risk factors.  Functional Fitness:  -Group instruction provided by verbal instruction, demonstration, patient participation, and written materials to support subject.  Instructors address safety measures for doing things around the house.  Discuss how to get up and down off the floor, how to pick things up properly, how to safely get out of a chair without assistance, and balance training.   Meditation and Mindfulness:  -Group instruction provided by verbal instruction, patient participation, and written materials to support subject.  Instructor addresses importance of mindfulness and meditation practice to help reduce stress and improve awareness.  Instructor also leads participants through a meditation exercise.    Stretching for Flexibility and Mobility:  -Group instruction provided by verbal instruction, patient participation, and written materials to support subject.  Instructors lead participants through series of stretches that are designed to increase flexibility thus improving mobility.  These stretches are additional exercise for major muscle groups that are typically performed during regular warm up and cool down.   Hands Only CPR:  -Group verbal, video, and participation provides a basic overview of AHA guidelines for community CPR. Role-play of emergencies allow participants the opportunity to practice calling for help and chest compression technique with discussion of AED use.   Hypertension: -Group verbal and written instruction that provides a basic overview of hypertension including the most recent diagnostic guidelines, risk factor reduction with self-care instructions and medication management.    CARDIAC REHAB PHASE II EXERCISE from 02/16/2018 in [facility] CARDIAC REHAB  Date  02/04/18  Instruction Review Code  2- Demonstrated Understanding       Nutrition I class: Heart Healthy Eating:  -Group instruction provided by PowerPoint slides, verbal discussion, and written materials to support subject matter. The instructor gives an explanation and review of the Therapeutic Lifestyle Changes diet recommendations, which includes a discussion on lipid goals, dietary fat, sodium, fiber, plant stanol/sterol esters, sugar, and the components of a well-balanced, healthy diet.   Nutrition II class: Lifestyle Skills:  -Group instruction provided by PowerPoint slides, verbal discussion, and written materials to support subject matter. The instructor gives an explanation and review of label reading, grocery shopping for heart health, heart healthy recipe modifications, and ways to make healthier choices when eating out.   Diabetes Question & Answer:  -Group instruction provided by PowerPoint slides, verbal discussion, and written materials to support subject matter. The instructor gives an explanation and review of diabetes co-morbidities, pre- and post-prandial blood glucose goals, pre-exercise blood glucose goals, signs, symptoms, and treatment of hypoglycemia and hyperglycemia, and foot care basics.   Diabetes Blitz:  -Group instruction provided by PowerPoint slides, verbal discussion, and written materials to support subject matter. The instructor gives an explanation and review of the physiology behind type 1 and type 2 diabetes, diabetes medications and rational behind using different medications, pre- and post-prandial blood glucose recommendations and Hemoglobin A1c goals, diabetes diet, and exercise including blood glucose guidelines for exercising safely.    Portion Distortion:  -Group instruction provided by PowerPoint slides, verbal discussion, written materials, and  food models to support subject matter. The instructor gives an explanation of serving size versus portion size, changes in portions sizes over the last 20 years, and what consists of a serving from each food group.   CARDIAC REHAB PHASE II EXERCISE from 02/16/2018 in [facility] CARDIAC REHAB  Date  02/02/18  Educator  RD  Instruction Review Code  2- Demonstrated Understanding      Stress Management:  -Group instruction provided by  verbal instruction, video, and written materials to support subject matter.  Instructors review role of stress in heart disease and how to cope with stress positively.     Exercising on Your Own:  -Group instruction provided by verbal instruction, power point, and written materials to support subject.  Instructors discuss benefits of exercise, components of exercise, frequency and intensity of exercise, and end points for exercise.  Also discuss use of nitroglycerin and activating EMS.  Review options of places to exercise outside of rehab.  Review guidelines for sex with heart disease.   Cardiac Drugs I:  -Group instruction provided by verbal instruction and written materials to support subject.  Instructor reviews cardiac drug classes: antiplatelets, anticoagulants, beta blockers, and statins.  Instructor discusses reasons, side effects, and lifestyle considerations for each drug class.   Cardiac Drugs II:  -Group instruction provided by verbal instruction and written materials to support subject.  Instructor reviews cardiac drug classes: angiotensin converting enzyme inhibitors (ACE-I), angiotensin II receptor blockers (ARBs), nitrates, and calcium channel blockers.  Instructor discusses reasons, side effects, and lifestyle considerations for each drug class.   CARDIAC REHAB PHASE II EXERCISE from 02/16/2018 in [facility] CARDIAC REHAB  Date  02/16/18  Instruction Review Code  2- Demonstrated Understanding      Anatomy and  Physiology of the Circulatory System:  Group verbal and written instruction and models provide basic cardiac anatomy and physiology, with the coronary electrical and arterial systems. Review of: AMI, Angina, Valve disease, Heart Failure, Peripheral Artery Disease, Cardiac Arrhythmia, Pacemakers, and the ICD.   Other Education:  -Group or individual verbal, written, or video instructions that support the educational goals of the cardiac rehab program.   Holiday Eating Survival Tips:  -Group instruction provided by PowerPoint slides, verbal discussion, and written materials to support subject matter. The instructor gives patients tips, tricks, and techniques to help them not only survive but enjoy the holidays despite the onslaught of food that accompanies the holidays.   Knowledge Questionnaire Score: Knowledge Questionnaire Score - 02/02/18 1115      Knowledge Questionnaire Score   Pre Score  18/24       Core Components/Risk Factors/Patient Goals at Admission: Personal Goals and Risk Factors at Admission - 01/27/18 1103      Core Components/Risk Factors/Patient Goals on Admission    Weight Management  Yes    Intervention  Weight Management: Develop a combined nutrition and exercise program designed to reach desired caloric intake, while maintaining appropriate intake of nutrient and fiber, sodium and fats, and appropriate energy expenditure required for the weight goal.;Weight Management: Provide education and appropriate resources to help participant work on and attain dietary goals.    Admit Weight  145 lb 10.2 oz (66.1 kg)    Goal Weight: Short Term  140 lb (63.5 kg)    Goal Weight: Long Term  144 lb (65.3 kg)    Expected Outcomes  Short Term: Continue to assess and modify interventions until short term weight is achieved;Long Term: Adherence to nutrition and physical activity/exercise program aimed toward attainment of established weight goal;Weight Loss: Understanding of general  recommendations for a balanced deficit meal plan, which promotes 1-2 lb weight loss per week and includes a negative energy balance of [phone] kcal/d;Understanding recommendations for meals to include 15-35% energy as protein, 25-35% energy from fat, 35-60% energy from carbohydrates, less than 200mg  of dietary cholesterol, 20-35 gm of total fiber daily;Understanding of distribution of calorie intake throughout the day with the  consumption of 4-5 meals/snacks    Hypertension  Yes    Intervention  Provide education on lifestyle modifcations including regular physical activity/exercise, weight management, moderate sodium restriction and increased consumption of fresh fruit, vegetables, and low fat dairy, alcohol moderation, and smoking cessation.;Monitor prescription use compliance.    Expected Outcomes  Short Term: Continued assessment and intervention until BP is < 140/7mm HG in hypertensive participants. < 130/47mm HG in hypertensive participants with diabetes, heart failure or chronic kidney disease.;Long Term: Maintenance of blood pressure at goal levels.    Lipids  Yes    Intervention  Provide education and support for participant on nutrition & aerobic/resistive exercise along with prescribed medications to achieve LDL 70mg , HDL >40mg .    Expected Outcomes  Long Term: Cholesterol controlled with medications as prescribed, with individualized exercise RX and with personalized nutrition plan. Value goals: LDL < 70mg , HDL > 40 mg.;Short Term: Participant states understanding of desired cholesterol values and is compliant with medications prescribed. Participant is following exercise prescription and nutrition guidelines.       Core Components/Risk Factors/Patient Goals Review:  Goals and Risk Factor Review    Row Name 02/22/18 1517 03/22/18 1717 04/21/18 1226         Core Components/Risk Factors/Patient Goals Review   Personal Goals Review  Weight Management/Obesity;Lipids;Hypertension  Weight  Management/Obesity;Lipids;Hypertension  Weight Management/Obesity;Lipids;Hypertension     Review  Nyelah's vital signs have been stable at phase 2 cardiac rehab. Ardena is doing well with exercise.  Janita's vital signs have been stable at phase 2 cardiac rehab. Angelyn is doing well with exercise.  Erline's vital signs have been stable at phase 2 cardiac rehab. Mykenzi is doing well with exercise.     Expected Outcomes  Jernie will continue to take her medications as prescribed and partcipate in phase 2 cardiac rehab   Carol will continue to take her medications as prescribed and partcipate in phase 2 cardiac rehab   Lucella will continue to take her medications as prescribed and partcipate in phase 2 cardiac rehab         Core Components/Risk Factors/Patient Goals at Discharge (Final Review):  Goals and Risk Factor Review - 04/21/18 1226      Core Components/Risk Factors/Patient Goals Review   Personal Goals Review  Weight Management/Obesity;Lipids;Hypertension    Review  Ceaira's vital signs have been stable at phase 2 cardiac rehab. Fancy is doing well with exercise.    Expected Outcomes  Monty will continue to take her medications as prescribed and partcipate in phase 2 cardiac rehab        ITP Comments: ITP Comments    Row Name 01/27/18 0949 02/22/18 1515 03/22/18 1717 04/21/18 1226     ITP Comments  Dr. Armanda Magic, Medical Director   30 day ITP Review. Patient with good attendance and participation in phase 2 cardiac rehab.  30 day ITP Review. Patient with good attendance and participation in phase 2 cardiac rehab.  30 day ITP Review. Patient with good attendance and participation in phase 2 cardiac rehab.       Comments: See ITP comments. Annis will graduate from cardiac rehab next week.Gladstone Lighter, RN,BSN 04/21/2018 12:29 PM

## 2018-04-22 ENCOUNTER — Encounter (HOSPITAL_COMMUNITY)
Admission: RE | Admit: 2018-04-22 | Discharge: 2018-04-22 | Disposition: A | Discharge status: Home / Self Care | Payer: Medicare HMO | Source: Ambulatory Visit | Attending: Cardiovascular Disease | Admitting: Cardiovascular Disease

## 2018-04-22 DIAGNOSIS — Z951 Presence of aortocoronary bypass graft: Secondary | ICD-10-CM

## 2018-04-22 DIAGNOSIS — Z48812 Encounter for surgical aftercare following surgery on the circulatory system: Secondary | ICD-10-CM | POA: Diagnosis not present

## 2018-04-22 NOTE — Progress Notes (Signed)
Discharge Progress Report  Patient Details  Name: GLORIMAR STROOPE MRN: [id number] Date of Birth: [birth year] Referring Provider:     CARDIAC REHAB PHASE II ORIENTATION from 01/27/2018 in [place]  Referring Provider  Quay Burow MD        Number of Visits: 36  Reason for Discharge:  Patient independent in their exercise. Patient has met program and personal goals.  Smoking History:  Social History   Tobacco Use  Smoking Status Never Smoker  Smokeless Tobacco Never Used    Diagnosis:  S/P CABG x 3  ADL UCSD:   Initial Exercise Prescription: Initial Exercise Prescription - 01/27/18 1100      Date of Initial Exercise RX and Referring Provider   Date  01/27/18    Referring Provider  Quay Burow MD       Treadmill   MPH  2.4    Grade  1    Minutes  10    METs  3.17      Bike   Level  0.8    Minutes  10    METs  3.02      Arm Ergometer   Level  2    Watts  35    Minutes  10    METs  3.5      Prescription Details   Frequency (times per week)  3x    Duration  Progress to 30 minutes of continuous aerobic without signs/symptoms of physical distress      Intensity   THRR 40-80% of Max Heartrate  66-133    Ratings of Perceived Exertion  11-13    Perceived Dyspnea  0-4      Progression   Progression  Continue progressive overload as per policy without signs/symptoms or physical distress.      Resistance Training   Training Prescription  Yes    Weight  2lbs    Reps  10-15       Discharge Exercise Prescription (Final Exercise Prescription Changes): Exercise Prescription Changes - 04/27/18 0956      Response to Exercise   Blood Pressure (Admit)  112/80    Blood Pressure (Exercise)  134/82    Blood Pressure (Exit)  120/62    Heart Rate (Admit)  78 bpm    Heart Rate (Exercise)  101 bpm    Rating of Perceived Exertion (Exercise)  12    Symptoms  none    Duration  Progress to 30 minutes of  aerobic without  signs/symptoms of physical distress    Intensity  THRR unchanged      Progression   Progression  Continue to progress workloads to maintain intensity without signs/symptoms of physical distress.    Average METs  3.3      Resistance Training   Training Prescription  No Relaxation day, no weights.      Interval Training   Interval Training  No      Treadmill   MPH  2.2    Grade  2    Minutes  10    METs  3.29      Bike   Level  0.8    Minutes  10    METs  3.19      NuStep   Level  4    SPM  3.4    Minutes  10    METs  2.9      Home Exercise Plan   Plans to continue exercise at  [facility]  Facility (comment)    Frequency  Add 2 additional days to program exercise sessions.    Initial Home Exercises Provided  02/07/18       Functional Capacity: 6 Minute Walk    Row Name 01/27/18 1105 04/18/18 1037       6 Minute Walk   Phase  Initial  Discharge    Distance  1493 feet  1627 feet    Distance % Change  -  8.98 %    Walk Time  6 minutes  6 minutes    # of Rest Breaks  0  0    MPH  2.8  3.08    METS  3.8  4.18    RPE  11  12    Perceived Dyspnea   0  -    VO2 Peak  13.4  14.64    Symptoms  No  No    Resting HR  74 bpm  72 bpm    Resting BP  113/74  110/70    Resting Oxygen Saturation   99 %  -    Exercise Oxygen Saturation  during 6 min walk  100 %  -    Max Ex. HR  84 bpm  106 bpm    Max Ex. BP  124/76  114/82    2 Minute Post BP  116/78  114/70       Psychological, QOL, Others - Outcomes: PHQ 2/9: Depression screen [facility] 2/9 04/22/2018 04/20/2018 01/19/2018 12/08/2017 04/13/2017  Decreased Interest 0 0 0 0 0  Down, Depressed, Hopeless 0 0 0 0 0  PHQ - 2 Score 0 0 0 0 0  Some recent data might be hidden    Quality of Life: Quality of Life - 04/25/18 1011      Quality of Life Scores   Health/Function Pre  24.18 %    Health/Function Post  25.67 %    Health/Function % Change  6.16 %    Socioeconomic Pre  25 %    Socioeconomic Post  21.36 %     Socioeconomic % Change   -14.56 %    Psych/Spiritual Pre  28.93 %    Psych/Spiritual Post  28.07 %    Psych/Spiritual % Change  -2.97 %    Family Pre  29.38 %    Family Post  27.88 %    Family % Change  -5.11 %    GLOBAL Pre  26.05 %    GLOBAL Post  25.53 %    GLOBAL % Change  -2 %       Personal Goals: Goals established at orientation with interventions provided to work toward goal. Personal Goals and Risk Factors at Admission - 01/27/18 1103      Core Components/Risk Factors/Patient Goals on Admission    Weight Management  Yes    Intervention  Weight Management: Develop a combined nutrition and exercise program designed to reach desired caloric intake, while maintaining appropriate intake of nutrient and fiber, sodium and fats, and appropriate energy expenditure required for the weight goal.;Weight Management: Provide education and appropriate resources to help participant work on and attain dietary goals.    Admit Weight  145 lb 10.2 oz (66.1 kg)    Goal Weight: Short Term  140 lb (63.5 kg)    Goal Weight: Long Term  144 lb (65.3 kg)    Expected Outcomes  Short Term: Continue to assess and modify interventions until short term weight is achieved;Long Term: Adherence to nutrition  and physical activity/exercise program aimed toward attainment of established weight goal;Weight Loss: Understanding of general recommendations for a balanced deficit meal plan, which promotes 1-2 lb weight loss per week and includes a negative energy balance of [phone] kcal/d;Understanding recommendations for meals to include 15-35% energy as protein, 25-35% energy from fat, 35-60% energy from carbohydrates, less than 265m of dietary cholesterol, 20-35 gm of total fiber daily;Understanding of distribution of calorie intake throughout the day with the consumption of 4-5 meals/snacks    Hypertension  Yes    Intervention  Provide education on lifestyle modifcations including regular physical activity/exercise,  weight management, moderate sodium restriction and increased consumption of fresh fruit, vegetables, and low fat dairy, alcohol moderation, and smoking cessation.;Monitor prescription use compliance.    Expected Outcomes  Short Term: Continued assessment and intervention until BP is < 140/988mHG in hypertensive participants. < 130/8067mG in hypertensive participants with diabetes, heart failure or chronic kidney disease.;Long Term: Maintenance of blood pressure at goal levels.    Lipids  Yes    Intervention  Provide education and support for participant on nutrition & aerobic/resistive exercise along with prescribed medications to achieve LDL <65m25mDL >40mg31m Expected Outcomes  Long Term: Cholesterol controlled with medications as prescribed, with individualized exercise RX and with personalized nutrition plan. Value goals: LDL < 65mg,65m > 40 mg.;Short Term: Participant states understanding of desired cholesterol values and is compliant with medications prescribed. Participant is following exercise prescription and nutrition guidelines.        Personal Goals Discharge: Goals and Risk Factor Review    Row Name 02/22/18 1517 03/22/18 1717 04/21/18 1226 04/22/18 1030       Core Components/Risk Factors/Patient Goals Review   Personal Goals Review  Weight Management/Obesity;Lipids;Hypertension  Weight Management/Obesity;Lipids;Hypertension  Weight Management/Obesity;Lipids;Hypertension  Weight Management/Obesity;Lipids;Hypertension    Review  Denitra's vital signs have been stable at phase 2 cardiac rehab. Karolee Azucenaing well with exercise.  Jeslynn's vital signs have been stable at phase 2 cardiac rehab. Tabitha Reniahing well with exercise.  Tyiesha's vital signs have been stable at phase 2 cardiac rehab. Karla Emmanueling well with exercise.  Teonia's vital signs have been stable at phase 2 cardiac rehab. Stasha Taniing well with exercise.    Expected Outcomes  Jennica will continue to take her medications as  prescribed and partcipate in phase 2 cardiac rehab   Cathrine Ariannecontinue to take her medications as prescribed and partcipate in phase 2 cardiac rehab   Tran Onikacontinue to take her medications as prescribed and partcipate in phase 2 cardiac rehab   Amariss Ezmaecontinue to take her medications as prescribed and walk on her own upon discharge from cardiac rehab       Exercise Goals and Review:   Nutrition & Weight - Outcomes: Pre Biometrics - 01/27/18 1104      Pre Biometrics   Height  _0  (1.575 m)    Weight  145 lb 15.1 oz (66.2 kg)    Waist Circumference  38 inches    Hip Circumference  39 inches    Waist to Hip Ratio  0.97 %    BMI (Calculated)  26.69    Triceps Skinfold  25 mm    % Body Fat  38.3 %    Grip Strength  30 kg    Flexibility  15 in    Single Leg Stand  30 seconds      Post Biometrics - 04/27/18 1045  Post  Biometrics   Height  _0  (1.575 m)    Weight  149 lb 4 oz (67.7 kg)    Waist Circumference  34.25 inches    Hip Circumference  38.25 inches    Waist to Hip Ratio  0.9 %    BMI (Calculated)  27.29    Triceps Skinfold  31 mm    % Body Fat  38.4 %    Grip Strength  28.5 kg    Flexibility  13 in    Single Leg Stand  5.45 seconds       Nutrition: Nutrition Therapy & Goals - 01/27/18 1215      Nutrition Therapy   Diet  Heart Healthy      Personal Nutrition Goals   Nutrition Goal  Pt to identify and limit food sources of saturated fat, trans fat, and sodium    Personal Goal #2  Pt to identify food quantities necessary to achieve weight loss of 6-10 lb (2.7-10.9 kg) at graduation from cardiac rehab.      Intervention Plan   Intervention  Prescribe, educate and counsel regarding individualized specific dietary modifications aiming towards targeted core components such as weight, hypertension, lipid management, diabetes, heart failure and other comorbidities.;Nutrition handout(s) given to patient. Healthy Eating    Expected Outcomes  Short Term  Goal: Understand basic principles of dietary content, such as calories, fat, sodium, cholesterol and nutrients.;Long Term Goal: Adherence to prescribed nutrition plan.       Nutrition Discharge: Nutrition Assessments - 05/06/18 1529      MEDFICTS Scores   Pre Score  15    Post Score  -- no survey available to assess at this time       Education Questionnaire Score: Knowledge Questionnaire Score - 04/25/18 1011      Knowledge Questionnaire Score   Pre Score  18/24    Post Score  16/24       Goals reviewed with patient; copy given to patient. Pt graduates from cardiac rehab program on Wednesday with completion of 36 exercise sessions in Phase II. Pt maintained good attendance and progressed nicely during his participation in rehab as evidenced by increased MET level.   Medication list reconciled. Repeat  PHQ score-0  .  Pt has made significant lifestyle changes and should be commended for her success. Pt feels she has achieved her goals during cardiac rehab.   Pt plans to continue exercise by walking in her neighborhood. Keona did not loose any weight while in the program. Lunetta increased her distance on her post exercise walk test. We are proud of Letitia's progress.Barnet Pall, RN,BSN 05/10/2018 4:23 PM

## 2018-04-25 ENCOUNTER — Encounter (HOSPITAL_COMMUNITY)
Admission: RE | Admit: 2018-04-25 | Discharge: 2018-04-25 | Disposition: A | Discharge status: Home / Self Care | Payer: Medicare HMO | Source: Ambulatory Visit | Attending: Cardiovascular Disease | Admitting: Cardiovascular Disease

## 2018-04-25 DIAGNOSIS — Z951 Presence of aortocoronary bypass graft: Secondary | ICD-10-CM

## 2018-04-25 DIAGNOSIS — Z48812 Encounter for surgical aftercare following surgery on the circulatory system: Secondary | ICD-10-CM | POA: Diagnosis not present

## 2018-04-27 ENCOUNTER — Encounter (HOSPITAL_COMMUNITY)
Admission: RE | Admit: 2018-04-27 | Discharge: 2018-04-27 | Disposition: A | Discharge status: Home / Self Care | Payer: Medicare HMO | Source: Ambulatory Visit | Attending: Cardiovascular Disease | Admitting: Cardiovascular Disease

## 2018-04-27 VITALS — BP 112/80 | HR 78 | Ht 62.0 in | Wt 149.3 lb

## 2018-04-27 DIAGNOSIS — Z951 Presence of aortocoronary bypass graft: Secondary | ICD-10-CM

## 2018-04-27 DIAGNOSIS — Z48812 Encounter for surgical aftercare following surgery on the circulatory system: Secondary | ICD-10-CM | POA: Diagnosis not present

## 2018-04-28 MED ORDER — METOPROLOL TARTRATE 25 MG PO TABS: 37.5000 mg | ORAL_TABLET | Freq: Two times a day (BID) | ORAL | 3 refills | Status: DC

## 2018-04-28 NOTE — Telephone Encounter (Signed)
Follow up    Cathy Smith with [facility] is calling about this refill prescription. She said that the patient is out of medication as of today. Please call.

## 2018-04-28 NOTE — Telephone Encounter (Signed)
The patient can take 1-1/2 tablets of the 25 mg

## 2018-04-28 NOTE — Telephone Encounter (Signed)
Rx(s) sent to pharmacy electronically.  

## 2018-04-29 ENCOUNTER — Encounter (HOSPITAL_COMMUNITY): Payer: Medicare HMO

## 2018-05-02 ENCOUNTER — Encounter (HOSPITAL_COMMUNITY): Payer: Medicare HMO

## 2018-05-04 ENCOUNTER — Encounter (HOSPITAL_COMMUNITY): Payer: Medicare HMO

## 2018-05-06 ENCOUNTER — Encounter (HOSPITAL_COMMUNITY): Payer: Medicare HMO

## 2018-05-09 ENCOUNTER — Other Ambulatory Visit: Payer: Medicare HMO

## 2018-05-09 DIAGNOSIS — B20 Human immunodeficiency virus [HIV] disease: Secondary | ICD-10-CM | POA: Diagnosis not present

## 2018-05-09 LAB — COMPLETE METABOLIC PANEL WITH GFR
AG RATIO: 1.4 (calc) (ref 1.0–2.5)
ALBUMIN MSPROF: 4.3 g/dL (ref 3.6–5.1)
ALT: 63 U/L — ABNORMAL HIGH (ref 6–29)
AST: 38 U/L — ABNORMAL HIGH (ref 10–35)
Alkaline phosphatase (APISO): 80 U/L (ref 33–130)
BUN: 12 mg/dL (ref 7–25)
CHLORIDE: 104 mmol/L (ref 98–110)
CO2: 28 mmol/L (ref 20–32)
Calcium: 9.1 mg/dL (ref 8.6–10.4)
Creat: 1.02 mg/dL (ref 0.50–1.05)
GFR, EST AFRICAN AMERICAN: 72 mL/min/{1.73_m2} (ref >=60)
GFR, Est Non African American: 62 mL/min/{1.73_m2} (ref >=60)
GLOBULIN: 3 g/dL (ref 1.9–3.7)
GLUCOSE: 126 mg/dL — AB (ref 65–99)
Potassium: 3.8 mmol/L (ref 3.5–5.3)
SODIUM: 140 mmol/L (ref 135–146)
TOTAL PROTEIN: 7.3 g/dL (ref 6.1–8.1)
Total Bilirubin: 0.5 mg/dL (ref 0.2–1.2)

## 2018-05-09 LAB — CBC
HCT: 34.4 % — ABNORMAL LOW (ref 35.0–45.0)
HEMOGLOBIN: 12.1 g/dL (ref 11.7–15.5)
MCH: 32.4 pg (ref 27.0–33.0)
MCHC: 35.2 g/dL (ref 32.0–36.0)
MCV: 92 fL (ref 80.0–100.0)
MPV: 11.2 fL (ref 7.5–12.5)
Platelets: 162 10*3/uL (ref 140–400)
RBC: 3.74 10*6/uL — AB (ref 3.80–5.10)
RDW: 12.3 % (ref 11.0–15.0)
WBC: 3.6 10*3/uL — AB (ref 3.8–10.8)

## 2018-05-10 LAB — T-HELPER CELL (CD4) - (RCID CLINIC ONLY)
CD4 % Helper T Cell: 18 % — ABNORMAL LOW (ref 33–55)
CD4 T Cell Abs: 300 /uL — ABNORMAL LOW (ref 400–2700)

## 2018-05-11 LAB — HIV-1 RNA QUANT-NO REFLEX-BLD
HIV 1 RNA QUANT: NOT DETECTED {copies}/mL
HIV-1 RNA QUANT, LOG: NOT DETECTED {Log_copies}/mL

## 2018-05-23 ENCOUNTER — Encounter: Payer: Medicare HMO | Admitting: Infectious Diseases

## 2018-05-25 ENCOUNTER — Ambulatory Visit: Payer: Medicare HMO | Admitting: Infectious Diseases

## 2018-05-27 ENCOUNTER — Encounter: Payer: Self-pay | Admitting: Infectious Diseases

## 2018-05-27 ENCOUNTER — Ambulatory Visit (INDEPENDENT_AMBULATORY_CARE_PROVIDER_SITE_OTHER): Payer: Medicare HMO | Admitting: Infectious Diseases

## 2018-05-27 VITALS — BP 101/68 | HR 70 | Temp 98.8°F | Ht 61.0 in | Wt 152.0 lb

## 2018-05-27 DIAGNOSIS — Z113 Encounter for screening for infections with a predominantly sexual mode of transmission: Secondary | ICD-10-CM | POA: Diagnosis not present

## 2018-05-27 DIAGNOSIS — Z79899 Other long term (current) drug therapy: Secondary | ICD-10-CM | POA: Diagnosis not present

## 2018-05-27 DIAGNOSIS — B20 Human immunodeficiency virus [HIV] disease: Secondary | ICD-10-CM | POA: Diagnosis not present

## 2018-05-27 DIAGNOSIS — D071 Carcinoma in situ of vulva: Secondary | ICD-10-CM

## 2018-05-27 DIAGNOSIS — I10 Essential (primary) hypertension: Secondary | ICD-10-CM

## 2018-05-27 NOTE — Assessment & Plan Note (Signed)
She is doing well on her salvage rx Has received PCV.  Husband helps her with her meds.  Offered/refused condoms.  rtc in 9 months

## 2018-05-27 NOTE — Progress Notes (Signed)
   Subjective:    Patient ID: Cathy DillingRenee C Smith, female    DOB: 01/04/1966, 52 y.o.   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  Denies missed.  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  Married since 2012, husband is HIV+ and follows with Dr Orvan Falconerampbell.  [facility]- she has GYN f/u today.   She was hospitalized 11-25-2017 and underwent 3v CABG after abn stress test, cath for worsening CP and SOB.  She completed cardiac rehab. Has been walking to keep up her exercise.   No problems with her ART.  Last PAP 01-2018 with [facility]. She does not have f/u appt sched.   PCP prev told her she was borderline DM.   HIV 1 RNA Quant (copies/mL)  Date Value  05/09/2018 <20 NOT DETECTED  01/19/2018 <20 NOT DETECTED  01/27/2017 <20 NOT DETECTED   CD4 T Cell Abs (/uL)  Date Value  05/09/2018 300 (L)  01/27/2017 280 (L)  08/05/2016 280 (L)    Review of Systems  Constitutional: Negative for appetite change and unexpected weight change.  Gastrointestinal: Negative for blood in stool and constipation.  Endocrine: Negative for polyuria.  Genitourinary: Negative for difficulty urinating.  Psychiatric/Behavioral: Negative for sleep disturbance.  has been eating more healthy foods, husband cooks.  Please see HPI. All other systems reviewed and negative.      Objective:   Physical Exam  Constitutional: She is oriented to person, place, and time. She appears well-developed and well-nourished.  HENT:  Mouth/Throat: No oropharyngeal exudate.  Eyes: Pupils are equal, round, and reactive to light. EOM are normal.  Neck: Normal range of motion. Neck supple.  Cardiovascular: Normal rate, regular rhythm and normal heart sounds.  Pulmonary/Chest: Effort normal and breath sounds normal.  Abdominal:  Soft. Bowel sounds are normal. She exhibits no distension. There is no tenderness. There is no guarding.  Lymphadenopathy:    She has no cervical adenopathy.  Neurological: She is alert and oriented to person, place, and time.  Psychiatric: She has a normal mood and affect.       Assessment & Plan:

## 2018-05-27 NOTE — Assessment & Plan Note (Signed)
Will get her appt with GYN.

## 2018-05-27 NOTE — Assessment & Plan Note (Signed)
She is well controlled Appreciate CV f/u.

## 2018-05-30 ENCOUNTER — Encounter: Payer: Self-pay | Admitting: Infectious Diseases

## 2018-06-17 ENCOUNTER — Other Ambulatory Visit: Payer: Self-pay | Admitting: Infectious Diseases

## 2018-06-17 DIAGNOSIS — Z1231 Encounter for screening mammogram for malignant neoplasm of breast: Secondary | ICD-10-CM

## 2018-07-20 ENCOUNTER — Ambulatory Visit
Admission: RE | Admit: 2018-07-20 | Discharge: 2018-07-20 | Disposition: A | Discharge status: Home / Self Care | Payer: Medicare HMO | Source: Ambulatory Visit | Attending: Infectious Diseases | Admitting: Infectious Diseases

## 2018-07-20 DIAGNOSIS — Z1231 Encounter for screening mammogram for malignant neoplasm of breast: Secondary | ICD-10-CM | POA: Diagnosis not present

## 2018-08-15 ENCOUNTER — Ambulatory Visit (INDEPENDENT_AMBULATORY_CARE_PROVIDER_SITE_OTHER): Payer: Medicare HMO | Admitting: Family Medicine

## 2018-08-15 ENCOUNTER — Encounter: Payer: Self-pay | Admitting: Family Medicine

## 2018-08-15 DIAGNOSIS — I10 Essential (primary) hypertension: Secondary | ICD-10-CM | POA: Diagnosis not present

## 2018-08-15 DIAGNOSIS — Z23 Encounter for immunization: Secondary | ICD-10-CM

## 2018-08-15 DIAGNOSIS — E785 Hyperlipidemia, unspecified: Secondary | ICD-10-CM | POA: Diagnosis not present

## 2018-08-15 DIAGNOSIS — M79604 Pain in right leg: Secondary | ICD-10-CM | POA: Diagnosis not present

## 2018-08-15 DIAGNOSIS — Z09 Encounter for follow-up examination after completed treatment for conditions other than malignant neoplasm: Secondary | ICD-10-CM

## 2018-08-15 DIAGNOSIS — M79605 Pain in left leg: Secondary | ICD-10-CM | POA: Diagnosis not present

## 2018-08-15 DIAGNOSIS — R5383 Other fatigue: Secondary | ICD-10-CM | POA: Diagnosis not present

## 2018-08-15 DIAGNOSIS — R7303 Prediabetes: Secondary | ICD-10-CM

## 2018-08-15 LAB — POCT GLYCOSYLATED HEMOGLOBIN (HGB A1C): Hemoglobin A1C: 5.7 % — AB (ref 4.0–5.6)

## 2018-08-15 LAB — POCT URINALYSIS DIP (MANUAL ENTRY)
Bilirubin, UA: NEGATIVE
Blood, UA: NEGATIVE
Glucose, UA: NEGATIVE mg/dL
Ketones, POC UA: NEGATIVE mg/dL
Leukocytes, UA: NEGATIVE
Nitrite, UA: NEGATIVE
Protein Ur, POC: NEGATIVE mg/dL
Spec Grav, UA: 1.025 (ref 1.010–1.025)
Urobilinogen, UA: 0.2 E.U./dL
pH, UA: 5.5 (ref 5.0–8.0)

## 2018-08-15 MED ORDER — NAPROXEN 500 MG PO TABS: 500.0000 mg | ORAL_TABLET | Freq: Two times a day (BID) | ORAL | 3 refills | Status: DC

## 2018-08-15 NOTE — Patient Instructions (Addendum)
Naproxen Sodium oral tablet, extended-release What is this medicine? NAPROXEN (na PROX en) is a non-steroidal anti-inflammatory drug (NSAID). It is used to reduce swelling and to treat pain. This medicine may be used for dental pain, headache, or painful monthly periods. It is also used for painful joint and muscular problems such as arthritis, tendinitis, bursitis, and gout. This medicine may be used for other purposes; ask your health care provider or pharmacist if you have questions. COMMON BRAND NAME(S): Midol Extended Relief, Naprelan Dose Card What should I tell my health care provider before I take this medicine? They need to know if you have any of these conditions: -asthma -cigarette smoker -drink more than 3 alcohol containing drinks a day -heart disease or circulation problems such as heart failure or leg edema (fluid retention) -high blood pressure -kidney disease -liver disease -stomach bleeding or ulcers -an unusual or allergic reaction to naproxen, aspirin, other NSAIDs, other medicines, foods, dyes, or preservatives -pregnant or trying to get pregnant -breast-feeding How should I use this medicine? Take this medicine by mouth with a glass of water. Follow the directions on the prescription label. Take this medicine with food if it upsets your stomach. Try to not lie down for at least 10 minutes after you take it. Take your medicine at regular intervals. Do not take your medicine more often than directed. Long-term, continuous use may increase the risk of heart attack or stroke. A special MedGuide will be given to you by the pharmacist with each prescription and refill. Be sure to read this information carefully each time. Talk to your pediatrician regarding the use of this medicine in children. Special care may be needed. Overdosage: If you think you have taken too much of this medicine contact a poison control center or emergency room at once. NOTE: This medicine is only  for you. Do not share this medicine with others. What if I miss a dose? If you miss a dose, take it as soon as you can. If it is almost time for your next dose, take only that dose. Do not take double or extra doses. What may interact with this medicine? -alcohol -aspirin -cidofovir -diuretics -lithium -methotrexate -other drugs for inflammation like ketorolac or prednisone -pemetrexed -probenecid -warfarin This list may not describe all possible interactions. Give your health care provider a list of all the medicines, herbs, non-prescription drugs, or dietary supplements you use. Also tell them if you smoke, drink alcohol, or use illegal drugs. Some items may interact with your medicine. What should I watch for while using this medicine? Tell your doctor or health care professional if your pain does not get better. Talk to your doctor before taking another medicine for pain. Do not treat yourself. This medicine does not prevent heart attack or stroke. In fact, this medicine may increase the chance of a heart attack or stroke. The chance may increase with longer use of this medicine and in people who have heart disease. If you take aspirin to prevent heart attack or stroke, talk with your doctor or health care professional. Do not take other medicines that contain aspirin, ibuprofen, or naproxen with this medicine. Side effects such as stomach upset, nausea, or ulcers may be more likely to occur. Many medicines available without a prescription should not be taken with this medicine. This medicine can cause ulcers and bleeding in the stomach and intestines at any time during treatment. Do not smoke cigarettes or drink alcohol. These increase irritation to your stomach and  can make it more susceptible to damage from this medicine. Ulcers and bleeding can happen without warning symptoms and can cause death. You may get drowsy or dizzy. Do not drive, use machinery, or do anything that needs mental  alertness until you know how this medicine affects you. Do not stand or sit up quickly, especially if you are an older patient. This reduces the risk of dizzy or fainting spells. This medicine can cause you to bleed more easily. Try to avoid damage to your teeth and gums when you brush or floss your teeth. What side effects may I notice from receiving this medicine? Side effects that you should report to your doctor or health care professional as soon as possible: -black or bloody stools, blood in the urine or vomit -blurred vision -chest pain -difficulty breathing or wheezing -nausea or vomiting -severe stomach pain -skin rash, skin redness, blistering or peeling skin, hives, or itching -slurred speech or weakness on one side of the body -swelling of eyelids, throat, lips -unexplained weight gain or swelling -unusually weak or tired -yellowing of eyes or skin Side effects that usually do not require medical attention (report to your doctor or health care professional if they continue or are bothersome): -constipation -headache -heartburn This list may not describe all possible side effects. Call your doctor for medical advice about side effects. You may report side effects to FDA at 1-800-FDA-1088. Where should I keep my medicine? Keep out of the reach of children. Store at room temperature between 20 and 25 degrees C (68 and 77 degrees F). Keep container tightly closed. Throw away any unused medicine after the expiration date. NOTE: This sheet is a summary. It may not cover all possible information. If you have questions about this medicine, talk to your doctor, pharmacist, or health care provider.  2018 Elsevier/Gold Standard (2009-10-28 20:26:54)    Heart-Healthy Eating Plan Heart-healthy meal planning includes:  Limiting unhealthy fats.  Increasing healthy fats.  Making other small dietary changes.  You may need to talk with your doctor or a diet specialist (dietitian) to  create an eating plan that is right for you. What types of fat should I choose?  Choose healthy fats. These include olive oil and canola oil, flaxseeds, walnuts, almonds, and seeds.  Eat more omega-3 fats. These include salmon, mackerel, sardines, tuna, flaxseed oil, and ground flaxseeds. Try to eat fish at least twice each week.  Limit saturated fats. ? Saturated fats are often found in animal products, such as meats, butter, and cream. ? Plant sources of saturated fats include palm oil, palm kernel oil, and coconut oil.  Avoid foods with partially hydrogenated oils in them. These include stick margarine, some tub margarines, cookies, crackers, and other baked goods. These contain trans fats. What general guidelines do I need to follow?  Check food labels carefully. Identify foods with trans fats or high amounts of saturated fat.  Fill one half of your plate with vegetables and green salads. Eat 4-5 servings of vegetables per day. A serving of vegetables is: ? 1 cup of raw leafy vegetables. ?  cup of raw or cooked cut-up vegetables. ?  cup of vegetable juice.  Fill one fourth of your plate with whole grains. Look for the word "whole" as the first word in the ingredient list.  Fill one fourth of your plate with lean protein foods.  Eat 4-5 servings of fruit per day. A serving of fruit is: ? One medium whole fruit. ?  cup of  dried fruit. ?  cup of fresh, frozen, or canned fruit. ?  cup of 100% fruit juice.  Eat more foods that contain soluble fiber. These include apples, broccoli, carrots, beans, peas, and barley. Try to get 20-30 g of fiber per day.  Eat more home-cooked food. Eat less restaurant, buffet, and fast food.  Limit or avoid alcohol.  Limit foods high in starch and sugar.  Avoid fried foods.  Avoid frying your food. Try baking, boiling, grilling, or broiling it instead. You can also reduce fat by: ? Removing the skin from poultry. ? Removing all visible fats  from meats. ? Skimming the fat off of stews, soups, and gravies before serving them. ? Steaming vegetables in water or broth.  Lose weight if you are overweight.  Eat 4-5 servings of nuts, legumes, and seeds per week: ? One serving of dried beans or legumes equals  cup after being cooked. ? One serving of nuts equals 1 ounces. ? One serving of seeds equals  ounce or one tablespoon.  You may need to keep track of how much salt or sodium you eat. This is especially true if you have high blood pressure. Talk with your doctor or dietitian to get more information. What foods can I eat? Grains Breads, including [place], white, pita, wheat, raisin, rye, oatmeal, and [place]. Tortillas that are neither fried nor made with lard or trans fat. Low-fat rolls, including hotdog and hamburger buns and English muffins. Biscuits. Muffins. Waffles. Pancakes. Light popcorn. Whole-grain cereals. Flatbread. Melba toast. Pretzels. Breadsticks. Rusks. Low-fat snacks. Low-fat crackers, including oyster, saltine, matzo, graham, animal, and rye. Rice and pasta, including brown rice and pastas that are made with whole wheat. Vegetables All vegetables. Fruits All fruits, but limit coconut. Meats and Other Protein Sources Lean, well-trimmed beef, veal, pork, and lamb. Chicken and [place] without skin. All fish and shellfish. Wild duck, rabbit, pheasant, and venison. Egg whites or low-cholesterol egg substitutes. Dried beans, peas, lentils, and tofu. Seeds and most nuts. Dairy Low-fat or nonfat cheeses, including ricotta, string, and mozzarella. Skim or 1% milk that is liquid, powdered, or evaporated. Buttermilk that is made with low-fat milk. Nonfat or low-fat yogurt. Beverages Mineral water. Diet carbonated beverages. Sweets and Desserts Sherbets and fruit ices. Honey, jam, marmalade, jelly, and syrups. Meringues and gelatins. Pure sugar candy, such as hard candy, jelly beans, gumdrops, mints, marshmallows, and small  amounts of dark chocolate. [employer]. Eat all sweets and desserts in moderation. Fats and Oils Nonhydrogenated (trans-free) margarines. Vegetable oils, including soybean, sesame, sunflower, olive, peanut, safflower, corn, canola, and cottonseed. Salad dressings or mayonnaise made with a vegetable oil. Limit added fats and oils that you use for cooking, baking, salads, and as spreads. Other Cocoa powder. Coffee and tea. All seasonings and condiments. The items listed above may not be a complete list of recommended foods or beverages. Contact your dietitian for more options. What foods are not recommended? Grains Breads that are made with saturated or trans fats, oils, or whole milk. Croissants. Butter rolls. Cheese breads. Sweet rolls. Donuts. Buttered popcorn. Chow mein noodles. High-fat crackers, such as cheese or butter crackers. Meats and Other Protein Sources Fatty meats, such as hotdogs, short ribs, sausage, spareribs, bacon, rib eye roast or steak, and mutton. High-fat deli meats, such as salami and bologna. Caviar. Domestic duck and goose. Organ meats, such as kidney, liver, sweetbreads, and heart. Dairy Cream, sour cream, cream cheese, and creamed cottage cheese. Whole-milk cheeses, including blue (bleu), [place],  [place], [place], [address], [place], [place], [place], [place], and [place]. Whole or 2% milk that is liquid, evaporated, or condensed. Whole buttermilk. Cream sauce or high-fat cheese sauce. Yogurt that is made from whole milk. Beverages Regular sodas and juice drinks with added sugar. Sweets and Desserts Frosting. Pudding. Cookies. Cakes other than angel food cake. Candy that has milk chocolate or white chocolate, hydrogenated fat, butter, coconut, or unknown ingredients. Buttered syrups. Full-fat ice cream or ice cream drinks. Fats and Oils Gravy that has suet, meat fat, or shortening. Cocoa butter, hydrogenated oils, palm oil, coconut oil, palm kernel oil. These can  often be found in baked products, candy, fried foods, nondairy creamers, and whipped toppings. Solid fats and shortenings, including bacon fat, salt pork, lard, and butter. Nondairy cream substitutes, such as coffee creamers and sour cream substitutes. Salad dressings that are made of unknown oils, cheese, or sour cream. The items listed above may not be a complete list of foods and beverages to avoid. Contact your dietitian for more information. This information is not intended to replace advice given to you by your health care provider. Make sure you discuss any questions you have with your health care provider. Document Released: 04/26/2012 Document Revised: 04/02/2016 Document Reviewed: 04/19/2014 Elsevier Interactive Patient Education  2018 [employer].   DASH Eating Plan DASH stands for "Dietary Approaches to Stop Hypertension." The DASH eating plan is a healthy eating plan that has been shown to reduce high blood pressure (hypertension). It may also reduce your risk for type 2 diabetes, heart disease, and stroke. The DASH eating plan may also help with weight loss. What are tips for following this plan? General guidelines  Avoid eating more than 2,300 mg (milligrams) of salt (sodium) a day. If you have hypertension, you may need to reduce your sodium intake to 1,500 mg a day.  Limit alcohol intake to no more than 1 drink a day for nonpregnant women and 2 drinks a day for men. One drink equals 12 oz of beer, 5 oz of wine, or 1 oz of hard liquor.  Work with your health care provider to maintain a healthy body weight or to lose weight. Ask what an ideal weight is for you.  Get at least 30 minutes of exercise that causes your heart to beat faster (aerobic exercise) most days of the week. Activities may include walking, swimming, or biking.  Work with your health care provider or diet and nutrition specialist (dietitian) to adjust your eating plan to your individual calorie needs. Reading  food labels  Check food labels for the amount of sodium per serving. Choose foods with less than 5 percent of the Daily Value of sodium. Generally, foods with less than 300 mg of sodium per serving fit into this eating plan.  To find whole grains, look for the word "whole" as the first word in the ingredient list. Shopping  Buy products labeled as "low-sodium" or "no salt added."  Buy fresh foods. Avoid canned foods and premade or frozen meals. Cooking  Avoid adding salt when cooking. Use salt-free seasonings or herbs instead of table salt or sea salt. Check with your health care provider or pharmacist before using salt substitutes.  Do not fry foods. Cook foods using healthy methods such as baking, boiling, grilling, and broiling instead.  Cook with heart-healthy oils, such as olive, canola, soybean, or sunflower oil. Meal planning   Eat a balanced diet that includes: ? 5 or more servings of fruits and vegetables each day.  At each meal, try to fill half of your plate with fruits and vegetables. ? Up to 6-8 servings of whole grains each day. ? Less than 6 oz of lean meat, poultry, or fish each day. A 3-oz serving of meat is about the same size as a deck of cards. One egg equals 1 oz. ? 2 servings of low-fat dairy each day. ? A serving of nuts, seeds, or beans 5 times each week. ? Heart-healthy fats. Healthy fats called Omega-3 fatty acids are found in foods such as flaxseeds and coldwater fish, like sardines, salmon, and mackerel.  Limit how much you eat of the following: ? Canned or prepackaged foods. ? Food that is high in trans fat, such as fried foods. ? Food that is high in saturated fat, such as fatty meat. ? Sweets, desserts, sugary drinks, and other foods with added sugar. ? Full-fat dairy products.  Do not salt foods before eating.  Try to eat at least 2 vegetarian meals each week.  Eat more home-cooked food and less restaurant, buffet, and fast food.  When eating at  a restaurant, ask that your food be prepared with less salt or no salt, if possible. What foods are recommended? The items listed may not be a complete list. Talk with your dietitian about what dietary choices are best for you. Grains Whole-grain or whole-wheat bread. Whole-grain or whole-wheat pasta. Brown rice. Orpah Cobb. Bulgur. Whole-grain and low-sodium cereals. Pita bread. Low-fat, low-sodium crackers. Whole-wheat flour tortillas. Vegetables Fresh or frozen vegetables (raw, steamed, roasted, or grilled). Low-sodium or reduced-sodium tomato and vegetable juice. Low-sodium or reduced-sodium tomato sauce and tomato paste. Low-sodium or reduced-sodium canned vegetables. Fruits All fresh, dried, or frozen fruit. Canned fruit in natural juice (without added sugar). Meat and other protein foods Skinless chicken or [place]. Ground chicken or [place]. Pork with fat trimmed off. Fish and seafood. Egg whites. Dried beans, peas, or lentils. Unsalted nuts, nut butters, and seeds. Unsalted canned beans. Lean cuts of beef with fat trimmed off. Low-sodium, lean deli meat. Dairy Low-fat (1%) or fat-free (skim) milk. Fat-free, low-fat, or reduced-fat cheeses. Nonfat, low-sodium ricotta or cottage cheese. Low-fat or nonfat yogurt. Low-fat, low-sodium cheese. Fats and oils Soft margarine without trans fats. Vegetable oil. Low-fat, reduced-fat, or light mayonnaise and salad dressings (reduced-sodium). Canola, safflower, olive, soybean, and sunflower oils. Avocado. Seasoning and other foods Herbs. Spices. Seasoning mixes without salt. Unsalted popcorn and pretzels. Fat-free sweets. What foods are not recommended? The items listed may not be a complete list. Talk with your dietitian about what dietary choices are best for you. Grains Baked goods made with fat, such as croissants, muffins, or some breads. Dry pasta or rice meal packs. Vegetables Creamed or fried vegetables. Vegetables in a cheese sauce.  Regular canned vegetables (not low-sodium or reduced-sodium). Regular canned tomato sauce and paste (not low-sodium or reduced-sodium). Regular tomato and vegetable juice (not low-sodium or reduced-sodium). Rosita [place]. Olives. Fruits Canned fruit in a light or heavy syrup. Fried fruit. Fruit in cream or butter sauce. Meat and other protein foods Fatty cuts of meat. Ribs. Fried meat. Tomasa Blase. Sausage. Bologna and other processed lunch meats. Salami. Fatback. Hotdogs. Bratwurst. Salted nuts and seeds. Canned beans with added salt. Canned or smoked fish. Whole eggs or egg yolks. Chicken or [place] with skin. Dairy Whole or 2% milk, cream, and half-and-half. Whole or full-fat cream cheese. Whole-fat or sweetened yogurt. Full-fat cheese. Nondairy creamers. Whipped toppings. Processed cheese and cheese spreads. Fats and oils Butter. Stick margarine.  Lard. Shortening. Ghee. Bacon fat. Tropical oils, such as coconut, palm kernel, or palm oil. Seasoning and other foods Salted popcorn and pretzels. Onion salt, garlic salt, seasoned salt, table salt, and sea salt. Worcestershire sauce. Tartar sauce. Barbecue sauce. Teriyaki sauce. Soy sauce, including reduced-sodium. Steak sauce. Canned and packaged gravies. Fish sauce. Oyster sauce. Cocktail sauce. Horseradish that you find on the shelf. Ketchup. Mustard. Meat flavorings and tenderizers. Bouillon cubes. Hot sauce and Tabasco sauce. Premade or packaged marinades. Premade or packaged taco seasonings. Relishes. Regular salad dressings. Where to find more information:  National Heart, Lung, and Blood Institute: PopSteam.is  American Heart Association: www.heart.org Summary  The DASH eating plan is a healthy eating plan that has been shown to reduce high blood pressure (hypertension). It may also reduce your risk for type 2 diabetes, heart disease, and stroke.  With the DASH eating plan, you should limit salt (sodium) intake to 2,300 mg a day. If you have  hypertension, you may need to reduce your sodium intake to 1,500 mg a day.  When on the DASH eating plan, aim to eat more fresh fruits and vegetables, whole grains, lean proteins, low-fat dairy, and heart-healthy fats.  Work with your health care provider or diet and nutrition specialist (dietitian) to adjust your eating plan to your individual calorie needs. This information is not intended to replace advice given to you by your health care provider. Make sure you discuss any questions you have with your health care provider. Document Released: 10/15/2011 Document Revised: 10/19/2016 Document Reviewed: 10/19/2016 Elsevier Interactive Patient Education  [employer].

## 2018-08-15 NOTE — Progress Notes (Signed)
Follow Up  Subjective:    Patient ID: Cathy Smith, female    DOB: June 01, 1966, 52 y.o.   MRN: [id number]   Chief Complaint  Patient presents with  . Follow-up    3 month on chronic conditon  . Leg Pain     HPI  Cathy Smith is a 53 year old female with a past medical history of Hypertriglyceridemia, Hypertension, HIV, and CAD. She is here for follow up.   Current Status: Since her last office visit, she is doing well today, with minimal pain in her legs, mostly in her left leg. She states that she does not take any medications to help with her leg pain. She denies visual changes, chest pain, cough, shortness of breath, heart palpitations, and falls. She has occasionally headaches and dizziness with position changes. Denies severe headaches, confusion, seizures, double vision, and blurred vision, nausea and vomiting.  She denies fevers, chills, fatigue, recent infections, weight loss, and night sweats. No reports of GI problems such as nausea, vomiting, diarrhea, and constipation. She has no reports of blood in stools, dysuria and hematuria. No depression or anxiety reported.   Past Medical History:  Diagnosis Date  . Coronary artery disease   . History of condyloma acuminatum   . History of uterine fibroid   . History of vaginal dysplasia    VAIN III--  oncologist-  dr Andrey Farmer  . History of vulvar dysplasia    VIN III   gyn-oncologist-  dr Andrey Farmer  . [facility] infection ([facility]-  dr hatcher  . Hypertension   . Hypertriglyceridemia without hypercholesterolemia   . PONV (postoperative nausea and vomiting)   . VIN III (vulvar intraepithelial neoplasia III)   . Wears glasses     Family History  Problem Relation Age of Onset  . Cataracts Mother   . Hypertension Father   . CAD Paternal Grandfather   . Colon cancer Neg Hx     Social History   Socioeconomic History  . Marital status: Married    Spouse name: Not on file  . Number of children: Not on  file  . Years of education: Not on file  . Highest education level: Not on file  Occupational History  . Not on file  Social Needs  . Financial resource strain: Not on file  . Food insecurity:    Worry: Not on file    Inability: Not on file  . Transportation needs:    Medical: Not on file    Non-medical: Not on file  Tobacco Use  . Smoking status: Never Smoker  . Smokeless tobacco: Never Used  Substance and Sexual Activity  . Alcohol use: No    Alcohol/week: 0.0 standard drinks  . Drug use: No  . Sexual activity: Yes    Partners: Male    Birth control/protection: Condom    Comment: pt. given condoms  Lifestyle  . Physical activity:    Days per week: Not on file    Minutes per session: Not on file  . Stress: Not on file  Relationships  . Social connections:    Talks on phone: Not on file    Gets together: Not on file    Attends religious service: Not on file    Active member of club or organization: Not on file    Attends meetings of clubs or organizations: Not on file    Relationship status: Not on file  . Intimate partner violence:    Fear  of current or ex partner: Not on file    Emotionally abused: Not on file    Physically abused: Not on file    Forced sexual activity: Not on file  Other Topics Concern  . Not on file  Social History Narrative  . Not on file    Past Surgical History:  Procedure Laterality Date  . ABDOMINAL HYSTERECTOMY  1995 approx  . ANTERIOR CERVICAL DECOMP/DISCECTOMY FUSION  11-30-2009   C4 -- C6  . CO2 LASER APPLICATION Bilateral 07/23/2015   Procedure: BILATERAL CO2 LASER ABLATION OF THE VULVA;  Surgeon: Adolphus Birchwood, MD;  Location: [facility];  Service: Gynecology;  Laterality: Bilateral;  . CO2 LASER APPLICATION N/A 02/25/2016   Procedure: CO2 LASER OF THE VULVAR;  Surgeon: Adolphus Birchwood, MD;  Location: [facility];  Service: Gynecology;  Laterality: N/A;  . CO2 LASER APPLICATION N/A 05/04/2017   Procedure: CO2  LASER VAPORIZATION OF THE VULVA;  Surgeon: Adolphus Birchwood, MD;  Location: [facility];  Service: Gynecology;  Laterality: N/A;  . COLONOSCOPY  01-28-2015  . CORONARY ARTERY BYPASS GRAFT N/A 11/25/2017   Procedure: CORONARY ARTERY BYPASS GRAFTING (CABG), ON PUMP, TIMES THREE, USING LEFT INTERNAL MAMMARY ARTERY AND ENDOSCOPICALLY HARVESTED LEFT GREATER SAPHENOUS VEIN;  Surgeon: Purcell Nails, MD;  Location: [facility] OR;  Service: Open Heart Surgery;  Laterality: N/A;  . I & D LEFT BUTTOCK ABSCESS  04-28-2001  . LEFT HEART CATH AND CORONARY ANGIOGRAPHY N/A 11/22/2017   Procedure: LEFT HEART CATH AND CORONARY ANGIOGRAPHY;  Surgeon: Runell Gess, MD;  Location: [facility];  Service: Cardiovascular;  Laterality: N/A;  . TEE WITHOUT CARDIOVERSION N/A 11/25/2017   Procedure: TRANSESOPHAGEAL ECHOCARDIOGRAM (TEE);  Surgeon: Purcell Nails, MD;  Location: [facility] OR;  Service: Open Heart Surgery;  Laterality: N/A;  . VULVECTOMY Right 05/06/2015   Procedure: WIDE LOCAL  EXCISION  OF RIGHT VULVA;  Surgeon: Adolphus Birchwood, MD;  Location: [facility];  Service: Gynecology;  Laterality: Right;  Marland Kitchen VULVECTOMY N/A 05/04/2017   Procedure: WIDE EXCISION VULVECTOMY;  Surgeon: Adolphus Birchwood, MD;  Location: [facility];  Service: Gynecology;  Laterality: N/A;    Immunization History  Administered Date(s) Administered  . H1N1 10/18/2008  . Hepatitis B 01/13/2001, 02/02/2001, 03/02/2001, 11/16/2001  . Hepatitis B, adult 02/05/2014, 08/08/2014, 02/11/2015  . Influenza Split 09/02/2011, 12/20/2012  . Influenza Whole 08/30/2006, 08/03/2007, 09/24/2008, 08/07/2009, 09/11/2010  . Influenza,inj,Quad PF,6+ Mos 08/02/2013, 08/08/2014, 08/27/2015, 08/19/2016, 09/20/2017, 08/15/2018  . Meningococcal Mcv4o 04/21/2017  . Pneumococcal Conjugate-13 01/13/2016  . Pneumococcal Polysaccharide-23 08/30/2006, 12/31/2010  . Tdap 09/06/2014    Current Meds  Medication Sig  . acetaminophen  (TYLENOL) 500 MG tablet Take 2 tablets (1,000 mg total) by mouth every 6 (six) hours as needed.  Marland Kitchen aspirin EC 325 MG EC tablet Take 1 tablet (325 mg total) by mouth daily.  Marland Kitchen atorvastatin (LIPITOR) 20 MG tablet Take 1 tablet (20 mg total) by mouth daily at 6 PM.  . furosemide (LASIX) 40 MG tablet Take 1 tablet (40 mg total) by mouth daily.  . GENVOYA 150-150-200-10 MG TABS tablet TAKE 1 TABLET BY MOUTH DAILY WITH BREAKFAST  . PREZISTA 800 MG tablet TAKE 1 TABLET(800 MG) BY MOUTH DAILY    No Known Allergies  BP 108/64 (BP Location: Left Arm, Patient Position: Sitting, Cuff Size: Small)   Pulse 80   Temp 98 F (36.7 C) (Oral)   Ht 5\' 1"  (1.549 m)  Wt 159 lb (72.1 kg)   SpO2 98%   BMI 30.04 kg/m   Review of Systems  Constitutional: Positive for fatigue (moderate).  HENT: Negative.   Respiratory: Negative.   Cardiovascular: Negative.   Gastrointestinal: Negative.   Genitourinary: Negative.   Musculoskeletal: Positive for arthralgias (mild left leg pain).  Skin: Negative.   Neurological: Positive for headaches (Occasional).  Hematological: Negative.   Psychiatric/Behavioral: Negative.    Objective:   Physical Exam  Constitutional: She is oriented to person, place, and time. She appears well-developed and well-nourished.  Cardiovascular: Normal rate, regular rhythm, normal heart sounds and intact distal pulses.  Pulmonary/Chest: Effort normal and breath sounds normal.  Abdominal: Soft. Bowel sounds are normal.  Musculoskeletal: Normal range of motion.  Neurological: She is alert and oriented to person, place, and time.  Skin: Skin is warm and dry.  Psychiatric: She has a normal mood and affect. Her behavior is normal. Judgment and thought content normal.  Nursing note and vitals reviewed.  Assessment & Plan:   1.Essential hypertension Antihypertensive medications are effective. Blood pressure is 108/64 today. She will continue Lasix and Metoprolol as prescribed. She will  continue to decrease high sodium intake, excessive alcohol intake, increase potassium intake, smoking cessation, and increase physical activity of at least 30 minutes of cardio activity daily. She will continue to follow Heart Healthy or DASH diet.  2. Hyperlipidemia, unspecified hyperlipidemia type She will continue Atovastatin as prescribed.   3. Pain in both lower extremities Chronic. We will initiate Naproxen today.  - naproxen (NAPROSYN) 500 MG tablet; Take 1 tablet (500 mg total) by mouth 2 (two) times daily with a meal.  Dispense: 30 tablet; Refill: 3  4. Fatigue, unspecified type Improved. We will continue to monitor.   5. Prediabetes Hgb A1c is stable at 5.7 today. She will continue to decrease foods/beverages high in sugars and carbs and follow Heart Healthy or DASH diet. Increase physical activity to at least 30 minutes cardio exercise daily.  - POCT glycosylated hemoglobin (Hb A1C) - POCT urinalysis dipstick  6. Need for immunization against influenza - Flu Vaccine QUAD 36+ mos IM  7. Follow up She will follow up in 6 months.   Meds ordered this encounter  Medications  . naproxen (NAPROSYN) 500 MG tablet    Sig: Take 1 tablet (500 mg total) by mouth 2 (two) times daily with a meal.    Dispense:  30 tablet    Refill:  3    Raliegh Ip,  MSN, FNP-C [facility] [address] [place], [place] [address] [phone]

## 2018-08-24 ENCOUNTER — Encounter: Payer: Self-pay | Admitting: Family Medicine

## 2018-08-29 ENCOUNTER — Telehealth: Payer: Self-pay

## 2018-08-29 NOTE — Telephone Encounter (Signed)
Patient called today regarding an appointment she has on 09/07/18 at [facility]. Patient states she was unaware of the referral and would call their office to reschedule to a later time. Will inform Dr.[name]. Lorenso Courier, [place]

## 2018-08-29 NOTE — Telephone Encounter (Signed)
thanks

## 2018-09-06 ENCOUNTER — Encounter: Payer: Self-pay | Admitting: Cardiovascular Disease

## 2018-09-06 ENCOUNTER — Ambulatory Visit (INDEPENDENT_AMBULATORY_CARE_PROVIDER_SITE_OTHER): Payer: Medicare HMO | Admitting: Cardiovascular Disease

## 2018-09-06 DIAGNOSIS — Z951 Presence of aortocoronary bypass graft: Secondary | ICD-10-CM | POA: Diagnosis not present

## 2018-09-06 DIAGNOSIS — I10 Essential (primary) hypertension: Secondary | ICD-10-CM | POA: Diagnosis not present

## 2018-09-06 DIAGNOSIS — E781 Pure hyperglyceridemia: Secondary | ICD-10-CM | POA: Diagnosis not present

## 2018-09-06 NOTE — Assessment & Plan Note (Signed)
History of essential hypertension blood pressure measured today 110/70.  She is on metoprolol.

## 2018-09-06 NOTE — Patient Instructions (Signed)

## 2018-09-06 NOTE — Assessment & Plan Note (Signed)
History of hyperlipidemia on statin therapy with lipid profile performed 12/07/2017 revealing LDL of 29 and HDL of 28.

## 2018-09-06 NOTE — Progress Notes (Signed)
09/06/2018 Cathy Smith   09/22/1966  [id number]  Primary Physician Kallie Locks, FNP Primary Cardiologist: Runell Gess MD Nicholes Calamity, [place]  HPI:  Cathy Smith is a 52 y.o.  moderately overweight married African-American female with no children. Does not work. She was referred to me byKimberly Harris FNPfor new onset chest pain. I last saw her in the office 03/08/2018. Marland KitchenShe really has no cardiac risk factors other than hypertension hyperlipidemia both of which are treated. There is no family history. She does not smoke. She had onset of chest pain 3-4 months ago,, currently trying to 3 times a week lasting up to 5-10 minutes at a time associated with shortness of breath.she had a Myoview stress test that showed severe ischemia in the LAD territory. She presents underwent cardiac catheterization by myself 11/22/17 revealing left main/three-vessel disease with preserved LV function. She underwent coronary artery bypass graft 3  11/25/17 by Dr. Ashley Mariner with a  LIMA to LAD, vein to obtuse marginal branch and  Vein to the posterior lateral branch of the RCA. Her postoperative course was uncomplicated.  She completed the cardiac rehab program.  Since I saw her back in the office 6 months ago she is remained asymptomatic denying chest pain or shortness of breath.  Current Meds  Medication Sig  . aspirin EC 325 MG EC tablet Take 1 tablet (325 mg total) by mouth daily.  Marland Kitchen atorvastatin (LIPITOR) 20 MG tablet Take 1 tablet (20 mg total) by mouth daily at 6 PM.  . furosemide (LASIX) 40 MG tablet Take 1 tablet (40 mg total) by mouth daily.  . GENVOYA 150-150-200-10 MG TABS tablet TAKE 1 TABLET BY MOUTH DAILY WITH BREAKFAST  . naproxen (NAPROSYN) 500 MG tablet Take 1 tablet (500 mg total) by mouth 2 (two) times daily with a meal.  . potassium chloride SA (K-DUR,KLOR-CON) 20 MEQ tablet Take 1 tablet (20 mEq total) by mouth daily.  Marland Kitchen PREZISTA 800 MG tablet TAKE 1 TABLET(800 MG) BY  MOUTH DAILY     No Known Allergies  Social History   Socioeconomic History  . Marital status: Married    Spouse name: Not on file  . Number of children: Not on file  . Years of education: Not on file  . Highest education level: Not on file  Occupational History  . Not on file  Social Needs  . Financial resource strain: Not on file  . Food insecurity:    Worry: Not on file    Inability: Not on file  . Transportation needs:    Medical: Not on file    Non-medical: Not on file  Tobacco Use  . Smoking status: Never Smoker  . Smokeless tobacco: Never Used  Substance and Sexual Activity  . Alcohol use: No    Alcohol/week: 0.0 standard drinks  . Drug use: No  . Sexual activity: Yes    Partners: Male    Birth control/protection: Condom    Comment: pt. given condoms  Lifestyle  . Physical activity:    Days per week: Not on file    Minutes per session: Not on file  . Stress: Not on file  Relationships  . Social connections:    Talks on phone: Not on file    Gets together: Not on file    Attends religious service: Not on file    Active member of club or organization: Not on file    Attends meetings of clubs or organizations:  Not on file    Relationship status: Not on file  . Intimate partner violence:    Fear of current or ex partner: Not on file    Emotionally abused: Not on file    Physically abused: Not on file    Forced sexual activity: Not on file  Other Topics Concern  . Not on file  Social History Narrative  . Not on file     Review of Systems: General: negative for chills, fever, night sweats or weight changes.  Cardiovascular: negative for chest pain, dyspnea on exertion, edema, orthopnea, palpitations, paroxysmal nocturnal dyspnea or shortness of breath Dermatological: negative for rash Respiratory: negative for cough or wheezing Urologic: negative for hematuria Abdominal: negative for nausea, vomiting, diarrhea, bright red blood per rectum, melena, or  hematemesis Neurologic: negative for visual changes, syncope, or dizziness All other systems reviewed and are otherwise negative except as noted above.    Blood pressure 110/70, pulse 63, height 5\' 1"  (1.549 m), weight 159 lb (72.1 kg).  General appearance: alert and no distress Neck: no adenopathy, no carotid bruit, no JVD, supple, symmetrical, trachea midline and thyroid not enlarged, symmetric, no tenderness/mass/nodules Lungs: clear to auscultation bilaterally Heart: regular rate and rhythm, S1, S2 normal, no murmur, click, rub or gallop Extremities: extremities normal, atraumatic, no cyanosis or edema Pulses: 2+ and symmetric Skin: Skin color, texture, turgor normal. No rashes or lesions Neurologic: Alert and oriented X 3, normal strength and tone. Normal symmetric reflexes. Normal coordination and gait  EKG sinus rhythm at 63 with low limb voltage and nonspecific ST and T wave changes poor R wave progression.  I personally reviewed this EKG.  ASSESSMENT AND PLAN:   Essential hypertension History of essential hypertension blood pressure measured today 110/70.  She is on metoprolol.  Hypertriglyceridemia without hypercholesterolemia History of hyperlipidemia on statin therapy with lipid profile performed 12/07/2017 revealing LDL of 29 and HDL of 28.  S/P CABG x 3 History of CAD status post bypass grafting 11/25/2017 by Dr. Cornelius Moras with a LIMA to his LAD, vein to the obtuse marginal branch and vein to the posterior lateral branch of the RCA.  She denies chest pain or shortness of breath.      Runell Gess MD FACP,FACC,FAHA, [facility] 09/06/2018 11:28 AM

## 2018-09-06 NOTE — Assessment & Plan Note (Signed)
History of CAD status post bypass grafting 11/25/2017 by Dr. Cornelius Moras with a LIMA to his LAD, vein to the obtuse marginal branch and vein to the posterior lateral branch of the RCA.  She denies chest pain or shortness of breath.

## 2018-09-07 ENCOUNTER — Encounter: Payer: Medicare HMO | Admitting: Obstetrics & Gynecology

## 2018-09-10 ENCOUNTER — Other Ambulatory Visit: Payer: Self-pay | Admitting: Infectious Diseases

## 2018-09-10 ENCOUNTER — Other Ambulatory Visit: Payer: Self-pay | Admitting: Cardiovascular Disease

## 2018-09-10 DIAGNOSIS — B20 Human immunodeficiency virus [HIV] disease: Secondary | ICD-10-CM

## 2018-09-12 NOTE — Telephone Encounter (Signed)
Rx(s) sent to pharmacy electronically.  

## 2018-09-21 ENCOUNTER — Other Ambulatory Visit: Payer: Self-pay | Admitting: *Deleted

## 2018-09-21 ENCOUNTER — Other Ambulatory Visit: Payer: Self-pay | Admitting: Infectious Diseases

## 2018-09-21 DIAGNOSIS — B20 Human immunodeficiency virus [HIV] disease: Secondary | ICD-10-CM

## 2018-09-21 MED ORDER — DARUNAVIR ETHANOLATE 800 MG PO TABS: ORAL_TABLET | ORAL | 5 refills | Status: DC

## 2018-09-21 MED ORDER — ELVITEG-COBIC-EMTRICIT-TENOFAF 150-150-200-10 MG PO TABS: 1.0000 | ORAL_TABLET | Freq: Every day | ORAL | 5 refills | Status: DC

## 2018-10-05 ENCOUNTER — Telehealth: Payer: Self-pay

## 2018-10-05 NOTE — Telephone Encounter (Signed)
Tried to contact patient no answer so I left a vm for patient to callback

## 2018-11-11 ENCOUNTER — Ambulatory Visit: Payer: Medicare HMO

## 2018-11-11 ENCOUNTER — Other Ambulatory Visit: Payer: Self-pay | Admitting: Cardiovascular Disease

## 2018-11-23 ENCOUNTER — Telehealth: Payer: Self-pay

## 2018-11-23 NOTE — Telephone Encounter (Signed)
Incoming call from pt asking when is her next appt with Dr Andrey Farmer.  Per notes, I told her that she was supposed f/u back in September (last appt was March 2019).  I told her I could get her appt right away in January, pt said she is not having any issues and prefers not to be seen until next month 'February'.  Appt given for 12-13-2018 at 1 pm.  Pt voiced understanding and no other needs per pt at this time.

## 2018-12-12 ENCOUNTER — Other Ambulatory Visit: Payer: Self-pay

## 2018-12-12 ENCOUNTER — Ambulatory Visit (INDEPENDENT_AMBULATORY_CARE_PROVIDER_SITE_OTHER): Payer: Medicare HMO | Admitting: Thoracic Surgery (Cardiothoracic Vascular Surgery)

## 2018-12-12 ENCOUNTER — Encounter: Payer: Self-pay | Admitting: Thoracic Surgery (Cardiothoracic Vascular Surgery)

## 2018-12-12 VITALS — BP 106/76 | HR 68 | Resp 16 | Ht 61.0 in | Wt 153.0 lb

## 2018-12-12 DIAGNOSIS — I2510 Atherosclerotic heart disease of native coronary artery without angina pectoris: Secondary | ICD-10-CM

## 2018-12-12 DIAGNOSIS — Z951 Presence of aortocoronary bypass graft: Secondary | ICD-10-CM

## 2018-12-12 NOTE — Patient Instructions (Signed)
Continue all previous medications without any changes at this time  You may resume unrestricted physical activity without any particular limitations at this time.  Make every effort to maintain a "heart-healthy" lifestyle with regular physical exercise and adherence to a low-fat, low-carbohydrate diet.  Continue to seek regular follow-up appointments with your primary care physician and/or cardiologist. 

## 2018-12-12 NOTE — Progress Notes (Signed)
[address]       Cathy Smith [address]             [phone]     CARDIOTHORACIC SURGERY OFFICE NOTE  Referring Provider is Runell Gess, MD PCP is Kallie Locks, FNP   HPI:  Patient is a 53 year old obese African-American female who returns to the office today for routine follow-up status post coronary artery bypass grafting x3 on November 25, 2017 for severe three-vessel coronary artery disease with exertional angina.  Her early postoperative recovery was uneventful and she was last seen here in our office on April 11, 2018 at which time she was doing well.  Since then she completed the cardiac rehab program and she has been seen in follow-up by Dr. Allyson Sabal on September 06, 2018.  She returns to our office today and reports that she is doing well.  She denies any symptoms of exertional chest pain or shortness of breath.  She is not currently exercising on a regular basis, although she has gone to the local [employer] intermittently with her husband.  Overall she feels well and has no complaints.  She reports that she does have a little bit of itching along her sternal scar, but otherwise she has no residual pain   Current Outpatient Medications  Medication Sig Dispense Refill  . aspirin EC 325 MG EC tablet Take 1 tablet (325 mg total) by mouth daily. 30 tablet 0  . atorvastatin (LIPITOR) 20 MG tablet Take 1 tablet (20 mg total) by mouth daily at 6 PM. 90 tablet 3  . darunavir (PREZISTA) 800 MG tablet TAKE 1 TABLET(800 MG) BY MOUTH DAILY 30 tablet 5  . elvitegravir-cobicistat-emtricitabine-tenofovir (GENVOYA) 150-150-200-10 MG TABS tablet Take 1 tablet by mouth daily with breakfast. 30 tablet 5  . furosemide (LASIX) 40 MG tablet TAKE 1 TABLET(40 MG) BY MOUTH DAILY 90 tablet 3  . metoprolol tartrate (LOPRESSOR) 25 MG tablet Take 1.5 tablets (37.5 mg total) by mouth 2 (two) times daily. 270 tablet 3  . potassium chloride SA (K-DUR,KLOR-CON) 20 MEQ tablet Take 1 tablet (20  mEq total) by mouth daily. 7 tablet 0   No current facility-administered medications for this visit.       Physical Exam:   BP 106/76 (BP Location: Left Arm, Patient Position: Sitting, Cuff Size: Large)   Pulse 68   Resp 16   Ht 5\' 1"  (1.549 m)   Wt 153 lb (69.4 kg)   SpO2 92% Comment: ON RA  BMI 28.91 kg/m   General:  Well-appearing  Chest:   Clear to auscultation  CV:   Regular rate and rhythm without murmur  Incisions:  Completely healed, sternum is stable  Abdomen:  Soft nontender  Extremities:  Warm and well-perfused  Diagnostic Tests:  n/a   Impression:  Patient is doing well approximately 1 year status post coronary artery bypass grafting  Plan:  We have not recommended any change the patient's current medications.  I have encouraged the patient to continue to increase her physical activity without limitations.  We discussed the many benefits of regular exercise and a heart healthy diet.  In the future she will call and return to see [place] only should specific problems or questions arise.  I spent in excess of 10 minutes during the conduct of this office consultation and >50% of this time involved direct face-to-face encounter with the patient for counseling and/or coordination of their care.    Salvatore Decent.  Cornelius Moras, MD 12/12/2018 11:25 AM

## 2018-12-13 ENCOUNTER — Encounter: Payer: Self-pay | Admitting: Gynecologic Oncology

## 2018-12-13 ENCOUNTER — Other Ambulatory Visit (HOSPITAL_COMMUNITY)
Admission: RE | Admit: 2018-12-13 | Discharge: 2018-12-13 | Disposition: A | Discharge status: Home / Self Care | Payer: Medicare HMO | Source: Ambulatory Visit | Attending: Gynecologic Oncology | Admitting: Gynecologic Oncology

## 2018-12-13 ENCOUNTER — Inpatient Hospital Stay: Payer: Medicare HMO | Attending: Gynecologic Oncology | Admitting: Gynecologic Oncology

## 2018-12-13 VITALS — BP 122/87 | HR 68 | Temp 98.1°F | Resp 18 | Ht 61.0 in | Wt 152.1 lb

## 2018-12-13 DIAGNOSIS — Z90710 Acquired absence of both cervix and uterus: Secondary | ICD-10-CM | POA: Diagnosis not present

## 2018-12-13 DIAGNOSIS — D071 Carcinoma in situ of vulva: Secondary | ICD-10-CM | POA: Insufficient documentation

## 2018-12-13 DIAGNOSIS — N901 Moderate vulvar dysplasia: Secondary | ICD-10-CM | POA: Diagnosis not present

## 2018-12-13 DIAGNOSIS — B20 Human immunodeficiency virus [HIV] disease: Secondary | ICD-10-CM | POA: Insufficient documentation

## 2018-12-13 NOTE — H&P (View-Only) (Signed)
Followup Note: Gyn-Onc   Cathy Smith 53 y.o. female  Chief Complaint  Patient presents with  . 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 S/p CO2 laser of vulva (circumferential, clitoris) with right WLE on 05/04/17 for VIN 3 (margins negative). LGSIL pap, high risk HPV infection, persistent VIN.  Plan:   Will see her back in January, 2021 for inspection. If high grade changes seen on today's biopsies/pap - will need surgical intervention.   Interval History  Her last Pap smear in March 2018 showed LGSIL + hrHPV.   She had open heart surgery earlier this year.   She underwent WLE right vulva and CO2 laser of anterior vulva on 05/04/17. She has no complaints.  HPI: 53 year old African American female seen in consultation requested of [name], and Dr. Marice Potterove regarding management of a newly diagnosed severe dysplasia of the upper vagina. Briefly, the patient's history includes having a past history of abdominal hysterectomy for what we understand and have been fibroids. She denies any problems with Pap smears prior to the hysterectomy. More recently she has had some abnormal Pap smears ultimately resulting in colposcopy and directed biopsy on 04/07/2012. 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  The patient did well until March 2015 with a Pap smear showing high-grade dysplasia once again. In April and May 2015 the patient received 2 additional months of Efudex. From June 2015 through February 2016 her Pap smears only showed low-grade SIL.  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 She was scheduled for a wide local excision of the right anterior vulva which was performed on 05/06/15 and revealed VIN3.  Margins were positive. Intraoperative evaluation showed lesions consistent with VIN3 on bilateral posterior labia. These were biopsied (but not treated at the patient's request). These biopsies also returned as positive for VIN3.  On 07/23/15 she was taken to the OR for an extensive CO2 laser ablation of the circumferential labia minora and majora to ablate all acetowhite areas. No pathology was taken. The clitoris was spared.   Her VIN 3 recurred and on 02/25/16 she returned to the OR for CO2 laser which included the anterior vulva and clitoris.  Pap in March, 2018 showed LGSIL Pap in March 2019 showed LGSIL , high risk HPV present.    Review of Systems:10 point review of systems is negative as noted above.   Vitals: Blood pressure 122/87, pulse 68, temperature 98.1 F (36.7 C), temperature source Oral, resp. rate 18, height 5\' 1"  (1.549 m), weight 152 lb 1 oz (69 kg), SpO2 96 %.  Physical Exam: General : The patient is a healthy woman in no acute distress.  HEENT: normocephalic, extraoccular movements normal; neck is supple without thyromegally  Lynphnodes: Supraclavicular and inguinal nodes not enlarged  Abdomen: Soft, non-tender, no ascites, no organomegally, no masses, no hernias  Pelvic:  EGBUS: 4% acetic acid applied. Subtle acetowhite changes to right labia minora and periclitoral tissues. No apparent high grade changes. 1cm firm condyloma at anterior suture line and posterior in midline - biopsied. Vagina: Normal, no lesions.. Pap taken.   Urethra and Bladder: Normal, non-tender  Cervix: Surgically absent  Uterus: Surgically absent  Bi-manual examination: Non-tender; no adenxal masses or nodularity  Rectal: normal sphincter tone, no masses, no  blood  Lower extremities: No edema or varicosities. Normal range of motion      No Known Allergies  Past Medical History:  Diagnosis Date  . Coronary artery disease   . History of condyloma acuminatum   . History of uterine fibroid    . History of vaginal dysplasia    VAIN III--  oncologist-  dr Andrey Farmerrossi  . History of vulvar dysplasia    VIN III   gyn-oncologist-  dr Andrey Farmerrossi  . [facility] infection ([facility]  dr hatcher  . Hypertension   . Hypertriglyceridemia without hypercholesterolemia   . PONV (postoperative nausea and vomiting)   . VIN III (vulvar intraepithelial neoplasia III)   . Wears glasses     Past Surgical History:  Procedure Laterality Date  . ABDOMINAL HYSTERECTOMY  1995 approx  . ANTERIOR CERVICAL DECOMP/DISCECTOMY FUSION  11-30-2009   C4 -- C6  . CO2 LASER APPLICATION Bilateral 07/23/2015   Procedure: BILATERAL CO2 LASER ABLATION OF THE VULVA;  Surgeon: Adolphus BirchwoodEmma Jermey Closs, MD;  [facility] [place];  Service: Gynecology;  Laterality: Bilateral;  . CO2 LASER APPLICATION N/A 02/25/2016   Procedure: CO2 LASER OF THE VULVAR;  Surgeon: Adolphus BirchwoodEmma Tannon Peerson, MD;  [facility] [place];  Service: Gynecology;  Laterality: N/A;  . CO2 LASER APPLICATION N/A 05/04/2017   Procedure: CO2 LASER VAPORIZATION OF THE VULVA;  Surgeon: Adolphus Birchwoodossi, Jazmynn Pho, MD;  [facility] [place];  Service: Gynecology;  Laterality: N/A;  . COLONOSCOPY  01-28-2015  . CORONARY ARTERY BYPASS GRAFT N/A 11/25/2017   Procedure: CORONARY ARTERY BYPASS GRAFTING (CABG), ON PUMP, TIMES THREE, USING LEFT INTERNAL MAMMARY ARTERY AND ENDOSCOPICALLY HARVESTED LEFT GREATER SAPHENOUS VEIN;  Surgeon: Purcell Nailswen, Clarence H, MD;  [facility];  Service: Open Heart Surgery;  Laterality: N/A;  . I & D LEFT BUTTOCK ABSCESS  04-28-2001  . LEFT HEART CATH AND CORONARY ANGIOGRAPHY N/A 11/22/2017   Procedure: LEFT HEART CATH AND CORONARY ANGIOGRAPHY;  Surgeon: Runell GessBerry, Jonathan J, MD;  Location: [facility];  Service: Cardiovascular;  Laterality: N/A;  . TEE WITHOUT CARDIOVERSION N/A 11/25/2017   Procedure: TRANSESOPHAGEAL ECHOCARDIOGRAM (TEE);  Surgeon: Purcell Nailswen, Clarence H, MD;  [facility];  Service: Open Heart  Surgery;  Laterality: N/A;  . VULVECTOMY Right 05/06/2015   Procedure: WIDE LOCAL  EXCISION  OF RIGHT VULVA;  Surgeon: Adolphus BirchwoodEmma Madelynne Lasker, MD;  [facility] [place];  Service: Gynecology;  Laterality: Right;  Marland Kitchen. VULVECTOMY N/A 05/04/2017   Procedure: WIDE EXCISION VULVECTOMY;  Surgeon: Adolphus Birchwoodossi, Kyndahl Jablon, MD;  [facility] [place];  Service: Gynecology;  Laterality: N/A;    Current Outpatient Medications  Medication Sig Dispense Refill  . aspirin EC 325 MG EC tablet Take 1 tablet (325 mg total) by mouth daily. 30 tablet 0  . atorvastatin (LIPITOR) 20 MG tablet Take 1 tablet (20 mg total) by mouth daily at 6 PM. 90 tablet 3  . darunavir (PREZISTA) 800 MG tablet TAKE 1 TABLET(800 MG) BY MOUTH DAILY 30 tablet 5  . elvitegravir-cobicistat-emtricitabine-tenofovir (GENVOYA) 150-150-200-10 MG TABS tablet Take 1 tablet by mouth daily with breakfast. 30 tablet 5  . furosemide (LASIX) 40 MG tablet TAKE 1 TABLET(40 MG) BY MOUTH DAILY 90 tablet 3  . potassium chloride SA (K-DUR,KLOR-CON) 20 MEQ tablet Take 1 tablet (20 mEq total) by mouth daily. 7 tablet 0  . metoprolol tartrate (LOPRESSOR) 25 MG tablet Take 1.5 tablets (37.5 mg total) by mouth 2 (two) times daily. 270 tablet 3  No current facility-administered medications for this visit.     Social History   Socioeconomic History  . Marital status: Married    Spouse name: Not on file  . Number of children: Not on file  . Years of education: Not on file  . Highest education level: Not on file  Occupational History  . Not on file  Social Needs  . Financial resource strain: Not on file  . Food insecurity:    Worry: Not on file    Inability: Not on file  . Transportation needs:    Medical: Not on file    Non-medical: Not on file  Tobacco Use  . Smoking status: Never Smoker  . Smokeless tobacco: Never Used  Substance and Sexual Activity  . Alcohol use: No    Alcohol/week: 0.0 standard drinks  . Drug use: No  . Sexual  activity: Yes    Partners: Male    Birth control/protection: Condom    Comment: pt. given condoms  Lifestyle  . Physical activity:    Days per week: Not on file    Minutes per session: Not on file  . Stress: Not on file  Relationships  . Social connections:    Talks on phone: Not on file    Gets together: Not on file    Attends religious service: Not on file    Active member of club or organization: Not on file    Attends meetings of clubs or organizations: Not on file    Relationship status: Not on file  . Intimate partner violence:    Fear of current or ex partner: Not on file    Emotionally abused: Not on file    Physically abused: Not on file    Forced sexual activity: Not on file  Other Topics Concern  . Not on file  Social History Narrative  . Not on file    Family History  Problem Relation Age of Onset  . Cataracts Mother   . Hypertension Father   . CAD Paternal Grandfather   . Colon cancer Neg Hx     Procedure Note:  Preop Dx: vulva masses hx of VIN 3 Postop Dx: same Procedure: vulva biopsies Surgeon: Raynelle Fanning, MD EBL: minimal Specimens: anterior vulva midline, posterior vulva midline Complications: none Procedure Details: Patient provided verbal consent verbal timeout was performed.  We applied acetic acid to the labia majora and minor.  The 2 nodular areas in the midline of the anterior and posterior vulva were identified.  Betadine was applied to these areas.  They were infiltrated with 2% lidocaine 1 cc in each location.  Biopsy forceps were used to take a piece of tissue from the nodular areas.  Bleeding was obtained with hemostasis by silver nitrate.  Specimens were sent for histopathology.  The patient tolerated procedure well.   [name], MD 12/13/2018, 1:59 PM

## 2018-12-13 NOTE — Progress Notes (Signed)
Followup Note: Gyn-Onc   Cathy Smith 53 y.o. female  Chief Complaint  Patient presents with  . 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 S/p CO2 laser of vulva (circumferential, clitoris) with right WLE on 05/04/17 for VIN 3 (margins negative). LGSIL pap, high risk HPV infection, persistent VIN.  Plan:   Will see her back in January, 2021 for inspection. If high grade changes seen on today's biopsies/pap - will need surgical intervention.   Interval History  Her last Pap smear in March 2018 showed LGSIL + hrHPV.   She had open heart surgery earlier this year.   She underwent WLE right vulva and CO2 laser of anterior vulva on 05/04/17. She has no complaints.  HPI: 46-year-old African American female seen in consultation requested of [name], and Dr. Dove regarding management of a newly diagnosed severe dysplasia of the upper vagina. Briefly, the patient's history includes having a past history of abdominal hysterectomy for what we understand and have been fibroids. She denies any problems with Pap smears prior to the hysterectomy. More recently she has had some abnormal Pap smears ultimately resulting in colposcopy and directed biopsy on 04/07/2012. 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  The patient did well until March 2015 with a Pap smear showing high-grade dysplasia once again. In April and May 2015 the patient received 2 additional months of Efudex. From June 2015 through February 2016 her Pap smears only showed low-grade SIL.  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 She was scheduled for a wide local excision of the right anterior vulva which was performed on 05/06/15 and revealed VIN3.  Margins were positive. Intraoperative evaluation showed lesions consistent with VIN3 on bilateral posterior labia. These were biopsied (but not treated at the patient's request). These biopsies also returned as positive for VIN3.  On 07/23/15 she was taken to the OR for an extensive CO2 laser ablation of the circumferential labia minora and majora to ablate all acetowhite areas. No pathology was taken. The clitoris was spared.   Her VIN 3 recurred and on 02/25/16 she returned to the OR for CO2 laser which included the anterior vulva and clitoris.  Pap in March, 2018 showed LGSIL Pap in March 2019 showed LGSIL , high risk HPV present.    Review of Systems:10 point review of systems is negative as noted above.   Vitals: Blood pressure 122/87, pulse 68, temperature 98.1 F (36.7 C), temperature source Oral, resp. rate 18, height 5' 1" (1.549 m), weight 152 lb 1 oz (69 kg), SpO2 96 %.  Physical Exam: General : The patient is a healthy woman in no acute distress.  HEENT: normocephalic, extraoccular movements normal; neck is supple without thyromegally  Lynphnodes: Supraclavicular and inguinal nodes not enlarged  Abdomen: Soft, non-tender, no ascites, no organomegally, no masses, no hernias  Pelvic:  EGBUS: 4% acetic acid applied. Subtle acetowhite changes to right labia minora and periclitoral tissues. No apparent high grade changes. 1cm firm condyloma at anterior suture line and posterior in midline - biopsied. Vagina: Normal, no lesions.. Pap taken.   Urethra and Bladder: Normal, non-tender  Cervix: Surgically absent  Uterus: Surgically absent  Bi-manual examination: Non-tender; no adenxal masses or nodularity  Rectal: normal sphincter tone, no masses, no   blood  Lower extremities: No edema or varicosities. Normal range of motion      No Known Allergies  Past Medical History:  Diagnosis Date  . Coronary artery disease   . History of condyloma acuminatum   . History of uterine fibroid    . History of vaginal dysplasia    VAIN III--  oncologist-  dr Andrey Farmerrossi  . History of vulvar dysplasia    VIN III   gyn-oncologist-  dr Andrey Farmerrossi  . HIV infection ([facility]) [facility]-  dr hatcher  . Hypertension   . Hypertriglyceridemia without hypercholesterolemia   . PONV (postoperative nausea and vomiting)   . VIN III (vulvar intraepithelial neoplasia III)   . Wears glasses     Past Surgical History:  Procedure Laterality Date  . ABDOMINAL HYSTERECTOMY  1995 approx  . ANTERIOR CERVICAL DECOMP/DISCECTOMY FUSION  11-30-2009   C4 -- C6  . CO2 LASER APPLICATION Bilateral 07/23/2015   Procedure: BILATERAL CO2 LASER ABLATION OF THE VULVA;  Surgeon: Adolphus BirchwoodEmma Alani Lacivita, MD;  [facility] [place];  Service: Gynecology;  Laterality: Bilateral;  . CO2 LASER APPLICATION N/A 02/25/2016   Procedure: CO2 LASER OF THE VULVAR;  Surgeon: Adolphus BirchwoodEmma Darek Eifler, MD;  [facility] [place];  Service: Gynecology;  Laterality: N/A;  . CO2 LASER APPLICATION N/A 05/04/2017   Procedure: CO2 LASER VAPORIZATION OF THE VULVA;  Surgeon: Adolphus Birchwoodossi, Jeremiah Curci, MD;  [facility] [place];  Service: Gynecology;  Laterality: N/A;  . COLONOSCOPY  01-28-2015  . CORONARY ARTERY BYPASS GRAFT N/A 11/25/2017   Procedure: CORONARY ARTERY BYPASS GRAFTING (CABG), ON PUMP, TIMES THREE, USING LEFT INTERNAL MAMMARY ARTERY AND ENDOSCOPICALLY HARVESTED LEFT GREATER SAPHENOUS VEIN;  Surgeon: Purcell Nailswen, Clarence H, MD;  [facility];  Service: Open Heart Surgery;  Laterality: N/A;  . I & D LEFT BUTTOCK ABSCESS  04-28-2001  . LEFT HEART CATH AND CORONARY ANGIOGRAPHY N/A 11/22/2017   Procedure: LEFT HEART CATH AND CORONARY ANGIOGRAPHY;  Surgeon: Runell GessBerry, Jonathan J, MD;  Location: [facility];  Service: Cardiovascular;  Laterality: N/A;  . TEE WITHOUT CARDIOVERSION N/A 11/25/2017   Procedure: TRANSESOPHAGEAL ECHOCARDIOGRAM (TEE);  Surgeon: Purcell Nailswen, Clarence H, MD;  [facility];  Service: Open Heart  Surgery;  Laterality: N/A;  . VULVECTOMY Right 05/06/2015   Procedure: WIDE LOCAL  EXCISION  OF RIGHT VULVA;  Surgeon: Adolphus BirchwoodEmma Lulubelle Simcoe, MD;  [facility] [place];  Service: Gynecology;  Laterality: Right;  Marland Kitchen. VULVECTOMY N/A 05/04/2017   Procedure: WIDE EXCISION VULVECTOMY;  Surgeon: Adolphus Birchwoodossi, Darreon Lutes, MD;  [facility] [place];  Service: Gynecology;  Laterality: N/A;    Current Outpatient Medications  Medication Sig Dispense Refill  . aspirin EC 325 MG EC tablet Take 1 tablet (325 mg total) by mouth daily. 30 tablet 0  . atorvastatin (LIPITOR) 20 MG tablet Take 1 tablet (20 mg total) by mouth daily at 6 PM. 90 tablet 3  . darunavir (PREZISTA) 800 MG tablet TAKE 1 TABLET(800 MG) BY MOUTH DAILY 30 tablet 5  . elvitegravir-cobicistat-emtricitabine-tenofovir (GENVOYA) 150-150-200-10 MG TABS tablet Take 1 tablet by mouth daily with breakfast. 30 tablet 5  . furosemide (LASIX) 40 MG tablet TAKE 1 TABLET(40 MG) BY MOUTH DAILY 90 tablet 3  . potassium chloride SA (K-DUR,KLOR-CON) 20 MEQ tablet Take 1 tablet (20 mEq total) by mouth daily. 7 tablet 0  . metoprolol tartrate (LOPRESSOR) 25 MG tablet Take 1.5 tablets (37.5 mg total) by mouth 2 (two) times daily. 270 tablet 3  No current facility-administered medications for this visit.     Social History   Socioeconomic History  . Marital status: Married    Spouse name: Not on file  . Number of children: Not on file  . Years of education: Not on file  . Highest education level: Not on file  Occupational History  . Not on file  Social Needs  . Financial resource strain: Not on file  . Food insecurity:    Worry: Not on file    Inability: Not on file  . Transportation needs:    Medical: Not on file    Non-medical: Not on file  Tobacco Use  . Smoking status: Never Smoker  . Smokeless tobacco: Never Used  Substance and Sexual Activity  . Alcohol use: No    Alcohol/week: 0.0 standard drinks  . Drug use: No  . Sexual  activity: Yes    Partners: Male    Birth control/protection: Condom    Comment: pt. given condoms  Lifestyle  . Physical activity:    Days per week: Not on file    Minutes per session: Not on file  . Stress: Not on file  Relationships  . Social connections:    Talks on phone: Not on file    Gets together: Not on file    Attends religious service: Not on file    Active member of club or organization: Not on file    Attends meetings of clubs or organizations: Not on file    Relationship status: Not on file  . Intimate partner violence:    Fear of current or ex partner: Not on file    Emotionally abused: Not on file    Physically abused: Not on file    Forced sexual activity: Not on file  Other Topics Concern  . Not on file  Social History Narrative  . Not on file    Family History  Problem Relation Age of Onset  . Cataracts Mother   . Hypertension Father   . CAD Paternal Grandfather   . Colon cancer Neg Hx     Procedure Note:  Preop Dx: vulva masses hx of VIN 3 Postop Dx: same Procedure: vulva biopsies Surgeon: Raynelle Fanning, MD EBL: minimal Specimens: anterior vulva midline, posterior vulva midline Complications: none Procedure Details: Patient provided verbal consent verbal timeout was performed.  We applied acetic acid to the labia majora and minor.  The 2 nodular areas in the midline of the anterior and posterior vulva were identified.  Betadine was applied to these areas.  They were infiltrated with 2% lidocaine 1 cc in each location.  Biopsy forceps were used to take a piece of tissue from the nodular areas.  Bleeding was obtained with hemostasis by silver nitrate.  Specimens were sent for histopathology.  The patient tolerated procedure well.   [name], MD 12/13/2018, 1:59 PM

## 2018-12-13 NOTE — Patient Instructions (Signed)
Dr Oliver Hum office will contact you with the results of the pap and biopsies. Please call [phone] in October, 2020 to schedule follow-up for January, 2021.

## 2018-12-14 ENCOUNTER — Ambulatory Visit (INDEPENDENT_AMBULATORY_CARE_PROVIDER_SITE_OTHER): Payer: Medicare HMO | Admitting: Family Medicine

## 2018-12-14 ENCOUNTER — Encounter: Payer: Self-pay | Admitting: Family Medicine

## 2018-12-14 VITALS — BP 112/68 | HR 70 | Temp 98.8°F | Ht 61.0 in | Wt 156.0 lb

## 2018-12-14 DIAGNOSIS — M79605 Pain in left leg: Secondary | ICD-10-CM | POA: Insufficient documentation

## 2018-12-14 DIAGNOSIS — I10 Essential (primary) hypertension: Secondary | ICD-10-CM | POA: Diagnosis not present

## 2018-12-14 DIAGNOSIS — R109 Unspecified abdominal pain: Secondary | ICD-10-CM | POA: Diagnosis not present

## 2018-12-14 DIAGNOSIS — Z09 Encounter for follow-up examination after completed treatment for conditions other than malignant neoplasm: Secondary | ICD-10-CM

## 2018-12-14 DIAGNOSIS — R10A2 Flank pain, left side: Secondary | ICD-10-CM | POA: Insufficient documentation

## 2018-12-14 LAB — POCT URINALYSIS DIPSTICK
Bilirubin, UA: NEGATIVE
Glucose, UA: NEGATIVE
Ketones, UA: NEGATIVE
Leukocytes, UA: NEGATIVE
Nitrite, UA: NEGATIVE
Protein, UA: NEGATIVE
Spec Grav, UA: 1.015 (ref 1.010–1.025)
Urobilinogen, UA: 0.2 E.U./dL
pH, UA: 5.5 (ref 5.0–8.0)

## 2018-12-14 MED ORDER — CYCLOBENZAPRINE HCL 10 MG PO TABS: 10.0000 mg | ORAL_TABLET | Freq: Three times a day (TID) | ORAL | 2 refills | Status: DC | PRN

## 2018-12-14 NOTE — Patient Instructions (Signed)
Ibuprofen tablets and capsules What is this medicine? IBUPROFEN (eye BYOO proe fen) is a non-steroidal anti-inflammatory drug (NSAID). It is used for dental pain, fever, headaches or migraines, osteoarthritis, rheumatoid arthritis, or painful monthly periods. It can also relieve minor aches and pains caused by a cold, flu, or sore throat. This medicine may be used for other purposes; ask your health care provider or pharmacist if you have questions. COMMON BRAND NAME(S): Advil, Advil Junior Strength, Advil Migraine, Genpril, Ibren, IBU, Midol, Midol Cramps and Body Aches, Motrin, Motrin IB, Motrin Junior Strength, Motrin Migraine Pain, Samson-8, Toxicology Saliva Collection What should I tell my health care provider before I take this medicine? They need to know if you have any of these conditions: -cigarette smoker -coronary artery bypass graft (CABG) surgery within the past 2 weeks -drink more than 3 alcohol-containing drinks a day -heart disease -high blood pressure -history of stomach bleeding -kidney disease -liver disease -lung or breathing disease, like asthma -an unusual or allergic reaction to ibuprofen, aspirin, other NSAIDs, other medicines, foods, dyes, or preservatives -pregnant or trying to get pregnant -breast-feeding How should I use this medicine? Take this medicine by mouth with a glass of water. Follow the directions on the prescription label. Take this medicine with food if your stomach gets upset. Try to not lie down for at least 10 minutes after you take the medicine. Take your medicine at regular intervals. Do not take your medicine more often than directed. A special MedGuide will be given to you by the pharmacist with each prescription and refill. Be sure to read this information carefully each time. Talk to your pediatrician regarding the use of this medicine in children. Special care may be needed. Overdosage: If you think you have taken too much of this medicine  contact a poison control center or emergency room at once. NOTE: This medicine is only for you. Do not share this medicine with others. What if I miss a dose? If you miss a dose, take it as soon as you can. If it is almost time for your next dose, take only that dose. Do not take double or extra doses. What may interact with this medicine? Do not take this medicine with any of the following medications: -cidofovir -ketorolac -methotrexate -pemetrexed This medicine may also interact with the following medications: -alcohol -aspirin -diuretics -lithium -other drugs for inflammation like prednisone -warfarin This list may not describe all possible interactions. Give your health care provider a list of all the medicines, herbs, non-prescription drugs, or dietary supplements you use. Also tell them if you smoke, drink alcohol, or use illegal drugs. Some items may interact with your medicine. What should I watch for while using this medicine? Tell your doctor or healthcare professional if your symptoms do not start to get better or if they get worse. This medicine does not prevent heart attack or stroke. In fact, this medicine may increase the chance of a heart attack or stroke. The chance may increase with longer use of this medicine and in people who have heart disease. If you take aspirin to prevent heart attack or stroke, talk with your doctor or health care professional. Do not take other medicines that contain aspirin, ibuprofen, or naproxen with this medicine. Side effects such as stomach upset, nausea, or ulcers may be more likely to occur. Many medicines available without a prescription should not be taken with this medicine. This medicine can cause ulcers and bleeding in the stomach and intestines at any time  during treatment. Ulcers and bleeding can happen without warning symptoms and can cause death. To reduce your risk, do not smoke cigarettes or drink alcohol while you are taking this  medicine. You may get drowsy or dizzy. Do not drive, use machinery, or do anything that needs mental alertness until you know how this medicine affects you. Do not stand or sit up quickly, especially if you are an older patient. This reduces the risk of dizzy or fainting spells. This medicine can cause you to bleed more easily. Try to avoid damage to your teeth and gums when you brush or floss your teeth. This medicine may be used to treat migraines. If you take migraine medicines for 10 or more days a month, your migraines may get worse. Keep a diary of headache days and medicine use. Contact your healthcare professional if your migraine attacks occur more frequently. What side effects may I notice from receiving this medicine? Side effects that you should report to your doctor or health care professional as soon as possible: -allergic reactions like skin rash, itching or hives, swelling of the face, lips, or tongue -severe stomach pain -signs and symptoms of bleeding such as bloody or black, tarry stools; red or dark-brown urine; spitting up blood or brown material that looks like coffee grounds; red spots on the skin; unusual bruising or bleeding from the eye, gums, or nose -signs and symptoms of a blood clot such as changes in vision; chest pain; severe, sudden headache; trouble speaking; sudden numbness or weakness of the face, arm, or leg -unexplained weight gain or swelling -unusually weak or tired -yellowing of eyes or skin Side effects that usually do not require medical attention (report to your doctor or health care professional if they continue or are bothersome): -bruising -diarrhea -dizziness, drowsiness -headache -nausea, vomiting This list may not describe all possible side effects. Call your doctor for medical advice about side effects. You may report side effects to FDA at 1-800-FDA-1088. Where should I keep my medicine? Keep out of the reach of children. Store at room  temperature between 15 and 30 degrees C (59 and 86 degrees F). Keep container tightly closed. Throw away any unused medicine after the expiration date. NOTE: This sheet is a summary. It may not cover all possible information. If you have questions about this medicine, talk to your doctor, pharmacist, or health care provider.  2019 Elsevier/[employer] Standard (2017-06-30 12:43:57) Muscle Pain, Adult Muscle pain (myalgia) may be mild or severe. In most cases, the pain lasts only a short time and it goes away without treatment. It is normal to feel some muscle pain after starting a workout program. Muscles that have not been used often will be sore at first. Muscle pain may also be caused by many other things, including:  Overuse or muscle strain, especially if you are not in shape. This is the most common cause of muscle pain.  Injury.  Bruises.  Viruses, such as the flu.  Infectious diseases.  A chronic condition that causes muscle tenderness, fatigue, and headache (fibromyalgia).  A condition, such as lupus, in which the body's disease-fighting system attacks other organs in the body (autoimmune or rheumatologic diseases).  Certain drugs, including ACE inhibitors and statins. To diagnose the cause of your muscle pain, your health care provider will do a physical exam and ask questions about the pain and when it began. If you have not had muscle pain for very long, your health care provider may want to wait before  doing much testing. If your muscle pain has lasted a long time, your health care provider may want to run tests right away. In some cases, this may include tests to rule out certain conditions or illnesses. Treatment for muscle pain depends on the cause. Home care is often enough to relieve muscle pain. Your health care provider may also prescribe anti-inflammatory medicine. Follow these instructions at home: Activity  If overuse is causing your muscle pain: ? Slow down your  activities until the pain goes away. ? Do regular, gentle exercises if you are not usually active. ? Warm up before exercising. Stretch before and after exercising. This can help lower the risk of muscle pain.  Do not continue working out if the pain is very bad. Bad pain could mean that you have injured a muscle. Managing pain and discomfort   If directed, apply ice to the sore muscle: ? Put ice in a plastic bag. ? Place a towel between your skin and the bag. ? Leave the ice on for 20 minutes, 2-3 times a day.  You may also alternate between applying ice and applying heat as told by your health care provider. To apply heat, use the heat source that your health care provider recommends, such as a moist heat pack or a heating pad. ? Place a towel between your skin and the heat source. ? Leave the heat on for 20-30 minutes. ? Remove the heat if your skin turns bright red. This is especially important if you are unable to feel pain, heat, or cold. You may have a greater risk of getting burned. Medicines  Take over-the-counter and prescription medicines only as told by your health care provider.  Do not drive or use heavy machinery while taking prescription pain medicine. Contact a health care provider if:  Your muscle pain gets worse and medicines do not help.  You have muscle pain that lasts longer than 3 days.  You have a rash or fever along with muscle pain.  You have muscle pain after a tick bite.  You have muscle pain while working out, even though you are in good physical condition.  You have redness, soreness, or swelling along with muscle pain.  You have muscle pain after starting a new medicine or changing the dose of a medicine. Get help right away if:  You have trouble breathing.  You have trouble swallowing.  You have muscle pain along with a stiff neck, fever, and vomiting.  You have severe muscle weakness or cannot move part of your body. This information is  not intended to replace advice given to you by your health care provider. Make sure you discuss any questions you have with your health care provider. Document Released: 09/17/2006 Document Revised: 05/15/2016 Document Reviewed: 03/17/2016 Elsevier Interactive Patient Education  2019 [employer]   Cyclobenzaprine tablets What is this medicine? CYCLOBENZAPRINE (sye kloe BEN za preen) is a muscle relaxer. It is used to treat muscle pain, spasms, and stiffness. This medicine may be used for other purposes; ask your health care provider or pharmacist if you have questions. COMMON BRAND NAME(S): Fexmid, Flexeril What should I tell my health care provider before I take this medicine? They need to know if you have any of these conditions: -heart disease, irregular heartbeat, or previous heart attack -liver disease -thyroid problem -an unusual or allergic reaction to cyclobenzaprine, tricyclic antidepressants, lactose, other medicines, foods, dyes, or preservatives -pregnant or trying to get pregnant -breast-feeding How should I use this  medicine? Take this medicine by mouth with a glass of water. Follow the directions on the prescription label. If this medicine upsets your stomach, take it with food or milk. Take your medicine at regular intervals. Do not take it more often than directed. Talk to your pediatrician regarding the use of this medicine in children. Special care may be needed. Overdosage: If you think you have taken too much of this medicine contact a poison control center or emergency room at once. NOTE: This medicine is only for you. Do not share this medicine with others. What if I miss a dose? If you miss a dose, take it as soon as you can. If it is almost time for your next dose, take only that dose. Do not take double or extra doses. What may interact with this medicine? Do not take this medicine with any of the following medications: -MAOIs like Carbex, Eldepryl, Marplan,  Nardil, and Parnate This medicine may also interact with the following medications: -alcohol -antihistamines for allergy, cough, and cold -certain medicines for anxiety or sleep -certain medicines for depression like amitriptyline, fluoxetine, sertraline -certain medicines for seizures like phenobarbital, primidone -contrast dyes -local anesthetics like lidocaine, pramoxine, tetracaine -medicines that relax muscles for surgery -narcotic medicines for pain -phenothiazines like chlorpromazine, mesoridazine, prochlorperazine This list may not describe all possible interactions. Give your health care provider a list of all the medicines, herbs, non-prescription drugs, or dietary supplements you use. Also tell them if you smoke, drink alcohol, or use illegal drugs. Some items may interact with your medicine. What should I watch for while using this medicine? Tell your doctor or health care professional if your symptoms do not start to get better or if they get worse. You may get drowsy or dizzy. Do not drive, use machinery, or do anything that needs mental alertness until you know how this medicine affects you. Do not stand or sit up quickly, especially if you are an older patient. This reduces the risk of dizzy or fainting spells. Alcohol may interfere with the effect of this medicine. Avoid alcoholic drinks. If you are taking another medicine that also causes drowsiness, you may have more side effects. Give your health care provider a list of all medicines you use. Your doctor will tell you how much medicine to take. Do not take more medicine than directed. Call emergency for help if you have problems breathing or unusual sleepiness. Your mouth may get dry. Chewing sugarless gum or sucking hard candy, and drinking plenty of water may help. Contact your doctor if the problem does not go away or is severe. What side effects may I notice from receiving this medicine? Side effects that you should report  to your doctor or health care professional as soon as possible: -allergic reactions like skin rash, itching or hives, swelling of the face, lips, or tongue -breathing problems -chest pain -fast, irregular heartbeat -hallucinations -seizures -unusually weak or tired Side effects that usually do not require medical attention (report to your doctor or health care professional if they continue or are bothersome): -headache -nausea, vomiting This list may not describe all possible side effects. Call your doctor for medical advice about side effects. You may report side effects to FDA at 1-800-FDA-1088. Where should I keep my medicine? Keep out of the reach of children. Store at room temperature between 15 and 30 degrees C (59 and 86 degrees F). Keep container tightly closed. Throw away any unused medicine after the expiration date. NOTE: This sheet  is a summary. It may not cover all possible information. If you have questions about this medicine, talk to your doctor, pharmacist, or health care provider.  2019 [employer] Standard (2017-08-18 13:04:35)

## 2018-12-14 NOTE — Progress Notes (Signed)
[facility]  Sick Visit--Established Patient  Subjective:  Patient ID: Cathy Smith, female    DOB: 08-16-1966  Age: 53 y.o. MRN: [id number]  CC:  Chief Complaint  Patient presents with  . Hip Pain    coming down left leg for 1 month   HPI Cathy Smith is a 53 year old female who presents for Sick Visit.  Past Medical History:  Diagnosis Date  . Coronary artery disease   . History of condyloma acuminatum   . History of uterine fibroid   . History of vaginal dysplasia    VAIN III--  oncologist-  dr Andrey Farmer  . History of vulvar dysplasia    VIN III   gyn-oncologist-  dr Andrey Farmer  . HIV infection (HCC) [facility]-  dr hatcher  . Hypertension   . Hypertriglyceridemia without hypercholesterolemia   . PONV (postoperative nausea and vomiting)   . VIN III (vulvar intraepithelial neoplasia III)   . Wears glasses    Current Status: Since her last office visit, she is doing well with no complaints. She has c/o left extremity pain X 1 month. She is currently taking Acetaminophen with minimal relief. She denies visual changes, chest pain, cough, shortness of breath, heart palpitations, and falls. She has occasional headaches and dizziness with position changes. Denies severe headaches, confusion, seizures, double vision, and blurred vision, nausea and vomiting. Her anxiety is mild today. She denies suicidal ideations, homicidal ideations, or auditory hallucinations. She is accompanied today by her husband.  She denies fevers, chills, fatigue, recent infections, weight loss, and night sweats. She has not had any headaches, visual changes, dizziness, and falls. No chest pain, heart palpitations, cough and shortness of breath reported. No reports of GI problems such as diarrhea, and constipation. She has no reports of blood in stools, dysuria and hematuria.   Past Surgical History:  Procedure Laterality Date  . ABDOMINAL  HYSTERECTOMY  1995 approx  . ANTERIOR CERVICAL DECOMP/DISCECTOMY FUSION  11-30-2009   C4 -- C6  . CO2 LASER APPLICATION Bilateral 07/23/2015   Procedure: BILATERAL CO2 LASER ABLATION OF THE VULVA;  Surgeon: Adolphus Birchwood, MD;  Location: [facility];  Service: Gynecology;  Laterality: Bilateral;  . CO2 LASER APPLICATION N/A 02/25/2016   Procedure: CO2 LASER OF THE VULVAR;  Surgeon: Adolphus Birchwood, MD;  Location: [facility];  Service: Gynecology;  Laterality: N/A;  . CO2 LASER APPLICATION N/A 05/04/2017   Procedure: CO2 LASER VAPORIZATION OF THE VULVA;  Surgeon: Adolphus Birchwood, MD;  Location: [facility] [place];  Service: Gynecology;  Laterality: N/A;  . COLONOSCOPY  01-28-2015  . CORONARY ARTERY BYPASS GRAFT N/A 11/25/2017   Procedure: CORONARY ARTERY BYPASS GRAFTING (CABG), ON PUMP, TIMES THREE, USING LEFT INTERNAL MAMMARY ARTERY AND ENDOSCOPICALLY HARVESTED LEFT GREATER SAPHENOUS VEIN;  Surgeon: Purcell Nails, MD;  Location: [facility] OR;  Service: Open Heart Surgery;  Laterality: N/A;  . I & D LEFT BUTTOCK ABSCESS  04-28-2001  . LEFT HEART CATH AND CORONARY ANGIOGRAPHY N/A 11/22/2017   Procedure: LEFT HEART CATH AND CORONARY ANGIOGRAPHY;  Surgeon: Runell Gess, MD;  Location: [facility];  Service: Cardiovascular;  Laterality: N/A;  . TEE WITHOUT CARDIOVERSION N/A 11/25/2017   Procedure: TRANSESOPHAGEAL ECHOCARDIOGRAM (TEE);  Surgeon: Purcell Nails, MD;  Location: [facility] OR;  Service: Open Heart Surgery;  Laterality: N/A;  . VULVECTOMY Right 05/06/2015   Procedure: WIDE LOCAL  EXCISION  OF RIGHT VULVA;  Surgeon: Adolphus Birchwood, MD;  Location: [facility];  Service: Gynecology;  Laterality: Right;  Marland Kitchen VULVECTOMY N/A 05/04/2017   Procedure: WIDE EXCISION VULVECTOMY;  Surgeon: Adolphus Birchwood, MD;  Location: [facility];  Service: Gynecology;  Laterality: N/A;    Family History  Problem Relation Age of Onset  . Cataracts Mother   .  Hypertension Father   . CAD Paternal Grandfather   . Colon cancer Neg Hx     Social History   Socioeconomic History  . Marital status: Married    Spouse name: Not on file  . Number of children: Not on file  . Years of education: Not on file  . Highest education level: Not on file  Occupational History  . Not on file  Social Needs  . Financial resource strain: Not on file  . Food insecurity:    Worry: Not on file    Inability: Not on file  . Transportation needs:    Medical: Not on file    Non-medical: Not on file  Tobacco Use  . Smoking status: Never Smoker  . Smokeless tobacco: Never Used  Substance and Sexual Activity  . Alcohol use: No    Alcohol/week: 0.0 standard drinks  . Drug use: No  . Sexual activity: Yes    Partners: Male    Birth control/protection: Condom    Comment: pt. given condoms  Lifestyle  . Physical activity:    Days per week: Not on file    Minutes per session: Not on file  . Stress: Not on file  Relationships  . Social connections:    Talks on phone: Not on file    Gets together: Not on file    Attends religious service: Not on file    Active member of club or organization: Not on file    Attends meetings of clubs or organizations: Not on file    Relationship status: Not on file  . Intimate partner violence:    Fear of current or ex partner: Not on file    Emotionally abused: Not on file    Physically abused: Not on file    Forced sexual activity: Not on file  Other Topics Concern  . Not on file  Social History Narrative  . Not on file    Outpatient Medications Prior to Visit  Medication Sig Dispense Refill  . aspirin EC 325 MG EC tablet Take 1 tablet (325 mg total) by mouth daily. 30 tablet 0  . atorvastatin (LIPITOR) 20 MG tablet Take 1 tablet (20 mg total) by mouth daily at 6 PM. 90 tablet 3  . darunavir (PREZISTA) 800 MG tablet TAKE 1 TABLET(800 MG) BY MOUTH DAILY 30 tablet 5  . elvitegravir-cobicistat-emtricitabine-tenofovir  (GENVOYA) 150-150-200-10 MG TABS tablet Take 1 tablet by mouth daily with breakfast. 30 tablet 5  . furosemide (LASIX) 40 MG tablet TAKE 1 TABLET(40 MG) BY MOUTH DAILY 90 tablet 3  . metoprolol tartrate (LOPRESSOR) 25 MG tablet Take 1.5 tablets (37.5 mg total) by mouth 2 (two) times daily. 270 tablet 3  . potassium chloride SA (K-DUR,KLOR-CON) 20 MEQ tablet Take 1 tablet (20 mEq total) by mouth daily. 7 tablet 0   No facility-administered medications prior to visit.     No Known Allergies  ROS Review of Systems  Constitutional: Negative.   HENT: Negative.   Eyes: Negative.   Respiratory: Negative.   Cardiovascular: Negative.   Gastrointestinal: Negative.   Endocrine: Negative.  Genitourinary: Negative.   Musculoskeletal: Negative.   Skin: Negative.   Allergic/Immunologic: Negative.   Neurological: Positive for dizziness and headaches.  Hematological: Negative.   Psychiatric/Behavioral: Negative.    Objective:    Physical Exam  Constitutional: She is oriented to person, place, and time. She appears well-developed and well-nourished.  HENT:  Head: Normocephalic and atraumatic.  Eyes: Conjunctivae are normal.  Neck: Normal range of motion. Neck supple.  Cardiovascular: Normal rate and regular rhythm.  Pulmonary/Chest: Effort normal and breath sounds normal.  Abdominal: Soft. Bowel sounds are normal.  Musculoskeletal: Normal range of motion.  Neurological: She is alert and oriented to person, place, and time. She has normal reflexes.  Skin: Skin is warm and dry.  Psychiatric: She has a normal mood and affect. Her behavior is normal. Judgment and thought content normal.  Nursing note and vitals reviewed.   BP 112/68 (BP Location: Left Arm, Patient Position: Sitting, Cuff Size: Large)   Pulse 70   Temp 98.8 F (37.1 C) (Oral)   Ht 5\' 1"  (1.549 m)   Wt 156 lb (70.8 kg)   SpO2 99%   BMI 29.48 kg/m  Wt Readings from Last 3 Encounters:  12/14/18 156 lb (70.8 kg)    12/13/18 152 lb 1 oz (69 kg)  12/12/18 153 lb (69.4 kg)   There are no preventive care reminders to display for this patient.  There are no preventive care reminders to display for this patient.  Lab Results  Component Value Date   TSH 1.160 11/19/2017   Lab Results  Component Value Date   WBC 3.6 (L) 05/09/2018   HGB 12.1 05/09/2018   HCT 34.4 (L) 05/09/2018   MCV 92.0 05/09/2018   PLT 162 05/09/2018   Lab Results  Component Value Date   NA 140 05/09/2018   K 3.8 05/09/2018   CO2 28 05/09/2018   GLUCOSE 126 (H) 05/09/2018   BUN 12 05/09/2018   CREATININE 1.02 05/09/2018   BILITOT 0.5 05/09/2018   ALKPHOS 87 12/08/2017   AST 38 (H) 05/09/2018   ALT 63 (H) 05/09/2018   PROT 7.3 05/09/2018   ALBUMIN 4.6 12/08/2017   CALCIUM 9.1 05/09/2018   ANIONGAP 12 11/29/2017   Lab Results  Component Value Date   CHOL 92 (L) 12/07/2017   Lab Results  Component Value Date   HDL 28 (L) 12/07/2017   Lab Results  Component Value Date   LDLCALC 29 12/07/2017   Lab Results  Component Value Date   TRIG 176 (H) 12/07/2017   Lab Results  Component Value Date   CHOLHDL 3.3 12/07/2017   Lab Results  Component Value Date   HGBA1C 5.7 (A) 08/15/2018   Assessment & Plan:   1. Left leg pain We will initiate Flexeril today.  - cyclobenzaprine (FLEXERIL) 10 MG tablet; Take 1 tablet (10 mg total) by mouth 3 (three) times daily as needed for muscle spasms.  Dispense: 30 tablet; Refill: 2  2. Essential hypertension Antihypertensive medications are effective. Blood pressure is within normal range of 112/68 today. Continue Metoprolol as prescribed. She will continue to decrease high sodium intake, excessive alcohol intake, increase potassium intake, smoking cessation, and increase physical activity of at least 30 minutes of cardio activity daily. She will continue to follow Heart Healthy or DASH diet.  3. Left flank pain Urinalysis is normal. - Urinalysis Dipstick  4. Follow  up She will keep previously scheduled appointment.   Meds ordered this encounter  Medications  . cyclobenzaprine (  FLEXERIL) 10 MG tablet    Sig: Take 1 tablet (10 mg total) by mouth 3 (three) times daily as needed for muscle spasms.    Dispense:  30 tablet    Refill:  2   Raliegh IpNatalie Wilmarie Sparlin,  MSN, FNP-C [facility] [address]  [place], [place] [id number] [phone]  Problem List Items Addressed This Visit    None     No orders of the defined types were placed in this encounter.  Follow-up: No follow-ups on file.   Kallie LocksNatalie M Derrika Ruffalo, FNP

## 2018-12-15 ENCOUNTER — Telehealth: Payer: Self-pay

## 2018-12-15 NOTE — Telephone Encounter (Signed)
Told Ms Buttacavoli that the Biopsies showed High Grade changes.  Dr. Andrey Farmer recommends having a Wide Local Excision and Co2 laser procedure in the OR. Patients states that February 18 th would be her first choice.  Second choice is 01-05-19. Told her that Warner Mccreedy, NP would call her and confirm the surgery date. Pt verbalized understanding.

## 2018-12-16 ENCOUNTER — Telehealth: Payer: Self-pay

## 2018-12-16 NOTE — Telephone Encounter (Signed)
Outgoing call to patient in regards to procedure will be on Feb 18th - "she will receive phone call from pre-surgical RN to go over instructions a few days before procedure" per Warner Mccreedy NP- pt voiced understanding. Pt asked about why she continues to have high grade changes- per Warner Mccreedy NP - explained to pt due to her medical history, her immunity can be low and more easier to develop these high-grade changes and procedure/ close observation will help address high - grade changes before they turn into cancer.  Pt voiced understanding. No other needs per pt at this time.

## 2018-12-20 ENCOUNTER — Telehealth: Payer: Self-pay

## 2018-12-20 LAB — CYTOLOGY - PAP: HPV: DETECTED — AB

## 2018-12-20 NOTE — Telephone Encounter (Signed)
Outgoing call per Warner Mccreedy NP to patient in regards to "stop Asprin today.  Due to risk of bleeding since she is having upcoming procedure. Pt reports she took one this am - I told her then not to take any tomorrow or days leading up to surgery and she can discuss with the doctor, when to resume.  Pt voiced understanding.  No other needs per pt at this time.

## 2018-12-21 ENCOUNTER — Telehealth: Payer: Self-pay | Admitting: Gynecologic Oncology

## 2018-12-21 NOTE — Telephone Encounter (Signed)
Spoke with the patient earlier today about her concerns in relation to stopping her aspirin for surgery on Tuesday.  All questions answered.  She was asking about her surgical procedure as well.  Due to her concerns, I reached out to Dr. Andrey Farmer who stated she could continue her aspirin since she was concerned given her recent CABG in Jan 2019.   Returned call to patient and informed her that Dr. Andrey Farmer now states she can continue her aspirin.  She verbalizes understanding. No other concerns voiced. Advised to call for any other questions. Advised she should be receiving a phone call from the pre-surgical RN to discuss instructions.

## 2018-12-22 ENCOUNTER — Other Ambulatory Visit: Payer: Self-pay

## 2018-12-22 ENCOUNTER — Encounter (HOSPITAL_BASED_OUTPATIENT_CLINIC_OR_DEPARTMENT_OTHER): Payer: Self-pay | Admitting: *Deleted

## 2018-12-22 NOTE — Progress Notes (Signed)
Spoke with patient via telephone for pre op interview. NPO after MN. Patient to take Lopressor, Prezista, and Genvoya with a sip of water AM of suegery. Will need BMET. Arrival time 0800.

## 2018-12-26 ENCOUNTER — Telehealth: Payer: Self-pay | Admitting: *Deleted

## 2018-12-26 NOTE — Telephone Encounter (Signed)
Patient called in inquiring about the medications that she should take before surgery and if she would be able to eat after her surgical procedure tomorrow.  Per Warner Mccreedy, NP patient can resume her regular diet after surgery, and please follow the  Pre-operative instructions concerning the medications to take the morning of surgery.  I reinforced  that the only three medications listed per the pre-operative note that should be taken the morning of surgery are the Lopressor, Prezista, and Genvoya.  I also reinforced that these medications should only be taken with a sip of water.  Patient should arrive at 8am.  Patient verbalized understanding.

## 2018-12-27 ENCOUNTER — Ambulatory Visit (HOSPITAL_BASED_OUTPATIENT_CLINIC_OR_DEPARTMENT_OTHER): Payer: Medicare HMO | Admitting: Anesthesiology

## 2018-12-27 ENCOUNTER — Encounter (HOSPITAL_BASED_OUTPATIENT_CLINIC_OR_DEPARTMENT_OTHER)
Admission: RE | Disposition: A | Discharge status: Home / Self Care | Payer: Self-pay | Source: Home / Self Care | Attending: Gynecologic Oncology

## 2018-12-27 ENCOUNTER — Other Ambulatory Visit: Payer: Self-pay

## 2018-12-27 ENCOUNTER — Encounter (HOSPITAL_BASED_OUTPATIENT_CLINIC_OR_DEPARTMENT_OTHER): Payer: Self-pay | Admitting: *Deleted

## 2018-12-27 ENCOUNTER — Ambulatory Visit (HOSPITAL_BASED_OUTPATIENT_CLINIC_OR_DEPARTMENT_OTHER)
Admission: RE | Admit: 2018-12-27 | Discharge: 2018-12-27 | Disposition: A | Discharge status: Home / Self Care | Payer: Medicare HMO | Attending: Gynecologic Oncology | Admitting: Gynecologic Oncology

## 2018-12-27 DIAGNOSIS — K6282 Dysplasia of anus: Secondary | ICD-10-CM | POA: Insufficient documentation

## 2018-12-27 DIAGNOSIS — I10 Essential (primary) hypertension: Secondary | ICD-10-CM | POA: Insufficient documentation

## 2018-12-27 DIAGNOSIS — D072 Carcinoma in situ of vagina: Secondary | ICD-10-CM | POA: Diagnosis not present

## 2018-12-27 DIAGNOSIS — Z7982 Long term (current) use of aspirin: Secondary | ICD-10-CM | POA: Diagnosis not present

## 2018-12-27 DIAGNOSIS — B20 Human immunodeficiency virus [HIV] disease: Secondary | ICD-10-CM | POA: Diagnosis not present

## 2018-12-27 DIAGNOSIS — D071 Carcinoma in situ of vulva: Secondary | ICD-10-CM | POA: Diagnosis not present

## 2018-12-27 DIAGNOSIS — I2510 Atherosclerotic heart disease of native coronary artery without angina pectoris: Secondary | ICD-10-CM | POA: Diagnosis not present

## 2018-12-27 DIAGNOSIS — E781 Pure hyperglyceridemia: Secondary | ICD-10-CM | POA: Diagnosis not present

## 2018-12-27 DIAGNOSIS — Z79899 Other long term (current) drug therapy: Secondary | ICD-10-CM | POA: Insufficient documentation

## 2018-12-27 DIAGNOSIS — Z951 Presence of aortocoronary bypass graft: Secondary | ICD-10-CM | POA: Insufficient documentation

## 2018-12-27 HISTORY — PX: VULVECTOMY: SHX1086

## 2018-12-27 HISTORY — PX: CO2 LASER APPLICATION: SHX5778

## 2018-12-27 LAB — BASIC METABOLIC PANEL
Anion gap: 8 (ref 5–15)
BUN: 14 mg/dL (ref 6–20)
CO2: 24 mmol/L (ref 22–32)
CREATININE: 0.91 mg/dL (ref 0.44–1.00)
Calcium: 9 mg/dL (ref 8.9–10.3)
Chloride: 107 mmol/L (ref 98–111)
GFR calc Af Amer: >60 mL/min (ref >60)
GFR calc non Af Amer: >60 mL/min (ref >60)
Glucose, Bld: 117 mg/dL — ABNORMAL HIGH (ref 70–99)
Potassium: 3.5 mmol/L (ref 3.5–5.1)
Sodium: 139 mmol/L (ref 135–145)

## 2018-12-27 SURGERY — WIDE EXCISION VULVECTOMY
Anesthesia: General

## 2018-12-27 MED ORDER — MIDAZOLAM HCL 5 MG/5ML IJ SOLN
INTRAMUSCULAR | Status: DC | PRN
  Administered 2018-12-27: 2 mg via INTRAVENOUS

## 2018-12-27 MED ORDER — LIDOCAINE 2% (20 MG/ML) 5 ML SYRINGE
INTRAMUSCULAR | Status: DC | PRN
  Administered 2018-12-27: 60 mg via INTRAVENOUS

## 2018-12-27 MED ORDER — LACTATED RINGERS IV SOLN
INTRAVENOUS | Status: DC
  Administered 2018-12-27 (×2): via INTRAVENOUS
  Filled 2018-12-27: qty 1000

## 2018-12-27 MED ORDER — OXYCODONE-ACETAMINOPHEN 5-325 MG PO TABS: 1.0000 | ORAL_TABLET | ORAL | 0 refills | Status: DC | PRN

## 2018-12-27 MED ORDER — DEXAMETHASONE SODIUM PHOSPHATE 10 MG/ML IJ SOLN
INTRAMUSCULAR | Status: DC | PRN
  Administered 2018-12-27: 5 mg via INTRAVENOUS

## 2018-12-27 MED ORDER — FENTANYL CITRATE (PF) 100 MCG/2ML IJ SOLN
INTRAMUSCULAR | Status: AC
  Filled 2018-12-27: qty 2

## 2018-12-27 MED ORDER — KETOROLAC TROMETHAMINE 30 MG/ML IJ SOLN
INTRAMUSCULAR | Status: DC | PRN
  Administered 2018-12-27: 30 mg via INTRAVENOUS

## 2018-12-27 MED ORDER — SENNA 8.6 MG PO TABS: 1.0000 | ORAL_TABLET | Freq: Every day | ORAL | 0 refills | Status: DC

## 2018-12-27 MED ORDER — EPHEDRINE SULFATE-NACL 50-0.9 MG/10ML-% IV SOSY
PREFILLED_SYRINGE | INTRAVENOUS | Status: DC | PRN
  Administered 2018-12-27: 10 mg via INTRAVENOUS

## 2018-12-27 MED ORDER — PROPOFOL 10 MG/ML IV BOLUS
INTRAVENOUS | Status: AC
  Filled 2018-12-27: qty 20

## 2018-12-27 MED ORDER — FENTANYL CITRATE (PF) 100 MCG/2ML IJ SOLN
INTRAMUSCULAR | Status: DC | PRN
  Administered 2018-12-27: 50 ug via INTRAVENOUS
  Administered 2018-12-27: 25 ug via INTRAVENOUS

## 2018-12-27 MED ORDER — PROPOFOL 10 MG/ML IV BOLUS
INTRAVENOUS | Status: DC | PRN
  Administered 2018-12-27: 150 mg via INTRAVENOUS

## 2018-12-27 MED ORDER — PHENYLEPHRINE 40 MCG/ML (10ML) SYRINGE FOR IV PUSH (FOR BLOOD PRESSURE SUPPORT)
PREFILLED_SYRINGE | INTRAVENOUS | Status: DC | PRN
  Administered 2018-12-27: 80 ug via INTRAVENOUS

## 2018-12-27 MED ORDER — ONDANSETRON HCL 4 MG/2ML IJ SOLN
INTRAMUSCULAR | Status: DC | PRN
  Administered 2018-12-27: 4 mg via INTRAVENOUS

## 2018-12-27 MED ORDER — ONDANSETRON HCL 4 MG/2ML IJ SOLN
4.0000 mg | Freq: Four times a day (QID) | INTRAMUSCULAR | Status: DC | PRN
  Filled 2018-12-27: qty 2

## 2018-12-27 MED ORDER — MIDAZOLAM HCL 2 MG/2ML IJ SOLN
INTRAMUSCULAR | Status: AC
  Filled 2018-12-27: qty 2

## 2018-12-27 MED ORDER — LIDOCAINE 2% (20 MG/ML) 5 ML SYRINGE
INTRAMUSCULAR | Status: AC
  Filled 2018-12-27: qty 5

## 2018-12-27 MED ORDER — ACETIC ACID 5 % SOLN
Status: DC | PRN
  Administered 2018-12-27: 1 via TOPICAL

## 2018-12-27 MED ORDER — OXYCODONE HCL 5 MG/5ML PO SOLN
5.0000 mg | Freq: Once | ORAL | Status: DC | PRN
  Filled 2018-12-27: qty 5

## 2018-12-27 MED ORDER — FENTANYL CITRATE (PF) 100 MCG/2ML IJ SOLN
25.0000 ug | INTRAMUSCULAR | Status: DC | PRN
  Filled 2018-12-27: qty 1

## 2018-12-27 MED ORDER — BUPIVACAINE LIPOSOME 1.3 % IJ SUSP
INTRAMUSCULAR | Status: DC | PRN
  Administered 2018-12-27: 18 mL

## 2018-12-27 MED ORDER — OXYCODONE HCL 5 MG PO TABS
5.0000 mg | ORAL_TABLET | Freq: Once | ORAL | Status: DC | PRN
  Filled 2018-12-27: qty 1

## 2018-12-27 MED ORDER — LIDOCAINE HCL 1 % IJ SOLN
INTRAMUSCULAR | Status: DC | PRN
  Administered 2018-12-27: 8 mL

## 2018-12-27 SURGICAL SUPPLY — 45 items
BLADE CLIPPER SURG (BLADE) IMPLANT
BLADE SURG 15 STRL LF DISP TIS (BLADE) ×1 IMPLANT
BLADE SURG 15 STRL SS (BLADE) ×2
CANISTER SUCT 3000ML PPV (MISCELLANEOUS) ×2 IMPLANT
CANISTER SUCTION 1200CC (MISCELLANEOUS) IMPLANT
CATH ROBINSON RED A/P 14FR (CATHETERS) ×2 IMPLANT
CATH ROBINSON RED A/P 16FR (CATHETERS) ×2 IMPLANT
COVER WAND RF STERILE (DRAPES) ×2 IMPLANT
DEPRESSOR TONGUE BLADE STERILE (MISCELLANEOUS) ×3 IMPLANT
DRSG TELFA 3X8 NADH (GAUZE/BANDAGES/DRESSINGS) IMPLANT
ELECT BALL LEEP 3MM BLK (ELECTRODE) IMPLANT
GAUZE 4X4 16PLY RFD (DISPOSABLE) ×2 IMPLANT
GLOVE BIO SURGEON STRL SZ 6 (GLOVE) ×4 IMPLANT
GOWN STRL REUS W/ TWL LRG LVL3 (GOWN DISPOSABLE) ×1 IMPLANT
GOWN STRL REUS W/TWL LRG LVL3 (GOWN DISPOSABLE) ×4 IMPLANT
KIT TURNOVER CYSTO (KITS) ×2 IMPLANT
NDL HYPO 25X1 1.5 SAFETY (NEEDLE) ×1 IMPLANT
NEEDLE HYPO 25X1 1.5 SAFETY (NEEDLE) ×2 IMPLANT
NS IRRIG 500ML POUR BTL (IV SOLUTION) ×2 IMPLANT
PACK PERINEAL COLD (PAD) ×2 IMPLANT
PACK VAGINAL WOMENS (CUSTOM PROCEDURE TRAY) ×2 IMPLANT
PAD DRESSING TELFA 3X8 NADH (GAUZE/BANDAGES/DRESSINGS) IMPLANT
PAD PREP 24X48 CUFFED NSTRL (MISCELLANEOUS) ×2 IMPLANT
PUNCH BIOPSY DERMAL 3 (INSTRUMENTS) ×1 IMPLANT
PUNCH BIOPSY DERMAL 3MM (INSTRUMENTS) ×2
PUNCH BIOPSY DERMAL 4MM (INSTRUMENTS) ×2 IMPLANT
SUT VIC AB 0 CT1 36 (SUTURE) IMPLANT
SUT VIC AB 0 SH 27 (SUTURE) ×2 IMPLANT
SUT VIC AB 2-0 CT2 27 (SUTURE) IMPLANT
SUT VIC AB 2-0 SH 27 (SUTURE)
SUT VIC AB 2-0 SH 27XBRD (SUTURE) IMPLANT
SUT VIC AB 3-0 PS2 18 (SUTURE)
SUT VIC AB 3-0 PS2 18XBRD (SUTURE) IMPLANT
SUT VIC AB 3-0 SH 27 (SUTURE) ×4
SUT VIC AB 3-0 SH 27X BRD (SUTURE) ×1 IMPLANT
SUT VIC AB 4-0 PS2 18 (SUTURE) ×2 IMPLANT
SUT VICRYL 0 UR6 27IN ABS (SUTURE) ×1 IMPLANT
SUT VICRYL 4-0 PS2 18IN ABS (SUTURE) ×2 IMPLANT
SWAB OB GYN 8IN STERILE 2PK (MISCELLANEOUS) ×4 IMPLANT
SYR BULB IRRIGATION 50ML (SYRINGE) ×2 IMPLANT
TOWEL OR 17X26 10 PK STRL BLUE (TOWEL DISPOSABLE) ×4 IMPLANT
TUBE CONNECTING 12X1/4 (SUCTIONS) IMPLANT
VACUUM HOSE 7/8X10 W/ WAND (MISCELLANEOUS) ×1 IMPLANT
VACUUM HOSE/TUBING 7/8INX6FT (MISCELLANEOUS) ×2 IMPLANT
WATER STERILE IRR 500ML POUR (IV SOLUTION) ×2 IMPLANT

## 2018-12-27 NOTE — Anesthesia Preprocedure Evaluation (Addendum)
Anesthesia Evaluation  Patient identified by MRN, date of birth, ID band Patient awake    Reviewed: Allergy & Precautions, NPO status , Patient's Chart, lab work & pertinent test results  History of Anesthesia Complications (+) PONV and history of anesthetic complications  Airway Mallampati: II   Neck ROM: full    Dental   Pulmonary neg pulmonary ROS,    breath sounds clear to auscultation       Cardiovascular hypertension, + CAD and + CABG   Rhythm:regular Rate:Normal     Neuro/Psych    GI/Hepatic   Endo/Other    Renal/GU      Musculoskeletal   Abdominal   Peds  Hematology  (+) HIV,   Anesthesia Other Findings   Reproductive/Obstetrics                            Anesthesia Physical Anesthesia Plan  ASA: III  Anesthesia Plan: General   Post-op Pain Management:    Induction: Intravenous  PONV Risk Score and Plan: 4 or greater and Ondansetron, Dexamethasone, Midazolam, Scopolamine patch - Pre-op and Treatment may vary due to age or medical condition  Airway Management Planned: LMA  Additional Equipment:   Intra-op Plan:   Post-operative Plan: Extubation in OR  Informed Consent: I have reviewed the patients History and Physical, chart, labs and discussed the procedure including the risks, benefits and alternatives for the proposed anesthesia with the patient or authorized representative who has indicated his/her understanding and acceptance.       Plan Discussed with: CRNA, Anesthesiologist and Surgeon  Anesthesia Plan Comments:         Anesthesia Quick Evaluation

## 2018-12-27 NOTE — Anesthesia Procedure Notes (Signed)
Procedure Name: LMA Insertion Date/Time: 12/27/2018 10:40 AM Performed by: Marny Lowenstein, CRNA Pre-anesthesia Checklist: Patient identified, Emergency Drugs available, Suction available and Patient being monitored Patient Re-evaluated:Patient Re-evaluated prior to induction Oxygen Delivery Method: Circle system utilized Preoxygenation: Pre-oxygenation with 100% oxygen Induction Type: IV induction Ventilation: Mask ventilation without difficulty LMA: LMA inserted LMA Size: 3.0 Number of attempts: 1 Placement Confirmation: positive ETCO2 and breath sounds checked- equal and bilateral Tube secured with: Tape Dental Injury: Teeth and Oropharynx as per pre-operative assessment

## 2018-12-27 NOTE — Op Note (Signed)
PATIENT: Shaelynne Tsutsui DATE: 12/27/18   Preop Diagnosis: VIN 3  Postoperative Diagnosis: same  Surgery: Partial simple anterior and posterior vulvectomy, CO2 laser of vulva, biopsy of perianal lesion  Surgeons:  Quinn Axe, MD Assistant: none  Anesthesia: General   Estimated blood loss: 10 ml  IVF:    Urine output: 50 ml   Complications: None   Pathology: left perianal biopsy, midline anterior vulva, perineal body  Operative findings: 32mm nodule at anterior midline above clitoris, acetowhite changes around clitoris (anterior to urethra), nodular 1cm area at midline perineal body, acetowhite changes and nodularity at left anal verge.  Procedure: The patient was identified in the preoperative holding area. Informed consent was signed on the chart. Patient was seen history was reviewed and exam was performed.   The patient was then taken to the operating room and placed in the supine position with SCD hose on. General anesthesia was then induced without difficulty. She was then placed in the dorsolithotomy position. The perineum was prepped with Betadine. The vagina was prepped with Betadine. The patient was then draped after the prep was dried. A Foley catheter was inserted into the bladder under sterile conditions, drained then removed.  Timeout was performed the patient, procedure, antibiotic, allergy, and length of procedure. 5% acetic acid solution was applied to the perineum. The vulvar tissues were inspected for areas of acetowhite changes or leukoplakia. The lesions were identified and the marking pen was used to circumscribe the area with appropriate surgical margins. The subcuticular tissues were infiltrated with 1% lidocaine and exparel. The 15 blade scalpel was used to make an incision through the skin circumferentially as marked. The skin elipses were grasped and was separated from the underlying deep dermal tissues with the bovie device. The left  perianal tissue that appeared abnormal with acetowhite changes was biopsied in a representative central area. The bovie was used to obtain hemostasis at the surgical beds. The subcutaneous tissues were irrigated and made hemostatic.   Both vulvar lesions were closed. The deep dermal layer was approximated with 3-0vicryl mattress sutures to bring the skin edges into approximation and off tension. The wound was closed following langher's lines. The cutaneous layer was closed with interrupted 4-0 vicryl stitches and mattress sutures to ensure a tension free and hemostatic closure. The perineum was again irrigated.  The patient's surgical field was draped with wet towels. The staff and patient ensured laser-safe eyewear and masks were fitted. The laser was set to 12 watts continuous. The laser was tested for accuracy on a tongue depressor.  The laser was applied to the circumscribed area of the vulva that had been previously identified. The tissue was ablated to the desired depth and the eschar was removed with a moistened sponge. When the entire lesion had been ablated the procedure was complete.  Silvadine cream was applied to the laser site.  All instrument, suture, laparotomy, Ray-Tec, and needle counts were correct x2. The patient tolerated the procedure well and was taken recovery room in stable condition. This is Adolphus Birchwood dictating an operative note on Brilynn Robben.  [name], MD

## 2018-12-27 NOTE — Interval H&P Note (Signed)
History and Physical Interval Note:  12/27/2018 9:41 AM  Cathy Smith  has presented today for surgery, with the diagnosis of VULVAR DYSPLASIA  The various methods of treatment have been discussed with the patient and family. After consideration of risks, benefits and other options for treatment, the patient has consented to  Procedure(s): WIDE EXCISION VULVECTOMY (N/A) CO2 LASER APPLICATION OF VULVA (N/A) as a surgical intervention .  The patient's history has been reviewed, patient examined, no change in status, stable for surgery.  I have reviewed the patient's chart and labs. VIN 3 was seen at both anterior and posterior biopsy sites.  Questions were answered to the patient's satisfaction.     [name]

## 2018-12-27 NOTE — Discharge Instructions (Signed)
°Post Anesthesia Home Care Instructions ° °Activity: °Get plenty of rest for the remainder of the day. A responsible individual must stay with you for 24 hours following the procedure.  °For the next 24 hours, DO NOT: °-Drive a car °-Operate machinery °-Drink alcoholic beverages °-Take any medication unless instructed by your physician °-Make any legal decisions or sign important papers. ° °Meals: °Start with liquid foods such as gelatin or soup. Progress to regular foods as tolerated. Avoid greasy, spicy, heavy foods. If nausea and/or vomiting occur, drink only clear liquids until the nausea and/or vomiting subsides. Call your physician if vomiting continues. ° °Special Instructions/Symptoms: °Your throat may feel dry or sore from the anesthesia or the breathing tube placed in your throat during surgery. If this causes discomfort, gargle with warm salt water. The discomfort should disappear within 24 hours. ° °If you had a scopolamine patch placed behind your ear for the management of post- operative nausea and/or vomiting: ° °1. The medication in the patch is effective for 72 hours, after which it should be removed.  Wrap patch in a tissue and discard in the trash. Wash hands thoroughly with soap and water. °2. You may remove the patch earlier than 72 hours if you experience unpleasant side effects which may include dry mouth, dizziness or visual disturbances. °3. Avoid touching the patch. Wash your hands with soap and water after contact with the patch. °  °Vulvectomy, Care After °The vulva is the external female genitalia, outside and around the vagina and pubic bone. It consists of: °· The skin on, and in front of, the pubic bone. °· The clitoris. °· The labia majora (large lips) on the outside of the vagina. °· The labia minora (small lips) around the opening of the vagina. °· The opening and the skin in and around the vagina. °A vulvectomy is the removal of the tissue of the vulva, which sometimes includes  removal of the lymph nodes and tissue in the groin areas. °These discharge instructions provide you with general information on caring for yourself after you leave the hospital. It is also important that you know the warning signs of complications, so that you can seek treatment. Please read the instructions outlined below and refer to this sheet in the next few weeks. Your caregiver may also give you specific information and medicines. If you have any questions or complications after discharge, please call your caregiver. °ACTIVITY °· Rest as much as possible the first two weeks after discharge. °· Arrange to have help from family or others with your daily activities when you go home. °· Avoid heavy lifting (more than 5 pounds), pushing, or pulling. °· If you feel tired, balance your activity with rest periods. °· Follow your caregiver's instruction about climbing stairs and driving a car. °· Increase activity gradually. °· Do not exercise until you have permission from your caregiver. °LEG AND FOOT CARE °If your doctor has removed lymph nodes from your groin area, there may be an increase in swelling of your legs and feet. You can help prevent swelling by doing the following: °· Elevate your legs while sitting or lying down. °· If your caregiver has ordered special stockings, wear them according to instructions. °· Avoid standing in one place for long periods of time. °· Call the physical therapy department if you have any questions about swelling or treatment for swelling. °· Avoid salt in your diet. It can cause fluid retention and swelling. °· Do not cross your legs, especially when   sitting. °NUTRITION °· You may resume your normal diet. °· Drink 6 to 8 glasses of fluids a day. °· Eat a healthy, balanced diet including portions of food from the meat (protein), milk, fruit, vegetable, and bread groups. °· Your caregiver may recommend you take a multivitamin with iron. °ELIMINATION °· You may notice that your  stream of urine is at a different angle, and may tend to spray. Using a plastic funnel may help to decrease urine spray. °· If constipation occurs, drink more liquids, and add more fruits, vegetables, and bran to your diet. You may take a mild laxative, such as Milk of Magnesia, Metamucil, or a stool softener such as Colace, with permission from your caregiver. °HYGIENE °· You may shower and wash your hair. °· Check with your caregiver about tub baths. °· Do not add any bath oils or chemicals to your bath water, after you have permission to take baths. °· While passing urine, pour water from a bottle or spray over your vulva to dilute the urine as it passes the incision (this will decrease burning and discomfort). °· Clean yourself well after moving your bowels. °· After urinating, do not wipe. Dap or pat dry with toilet paper or a dry cleath soft cloth. °· A sitz bath will help keep your perineal area clean, reduce swelling, and provide comfort. °· Avoid wearing underpants for the first 2 weeks and wear loose skirts to allow circulation of air around the incision °· You do not need to apply dressings, salves or lotions to the wound. °· The stitches are self-dissolving and will absorb and disappear over a couple of months (it is normal to notice the knot from the stitches on toilet paper after voiding). °HOME CARE INSTRUCTIONS  °· Apply a soft ice pack (or frozen bag of peas) to your perineum (vulva) every hour in the first 48 hours after surgery. This will reduce swelling. °· Avoid activities that involve a lot of friction between your legs. °· Avoid wearing pants or underpants in the 1st 2 weeks (skirts are preferable). °· Take your temperature twice a day and record it, especially if you feel feverish or have chills. °· Follow your caregiver's instructions about medicines, activity, and follow-up appointments after surgery. °· Do not drink alcohol while taking pain medicine. °· Change your dressing as advised by  your caregiver. °· You may take over-the-counter medicine for pain, recommended by your caregiver. °· If your pain is not relieved with medicine, call your caregiver. °· Do not take aspirin because it can cause bleeding. °· Do not douche or use tampons (use a nonperfumed sanitary pad). °· Do not have sexual intercourse until your caregiver gives you permission (typically 6 weeks postoperatively). Hugging, kissing, and playful sexual activity is fine with your caregiver's permission. °· Warm sitz baths, with your caregiver's permission, are helpful to control swelling and discomfort. °· Take showers instead of baths, until your caregiver gives you permission to take baths. °· You may take a mild medicine for constipation, recommended by your caregiver. Bran foods and drinking a lot of fluids will help with constipation. °· Make sure your family understands everything about your operation and recovery. °SEEK MEDICAL CARE IF:  °· You notice swelling and redness around the wound area. °· You notice a foul smell coming from the wound or on the surgical dressing. °· You notice the wound is separating. °· You have painful or bloody urination. °· You develop nausea and vomiting. °· You develop diarrhea. °·   You develop a rash. °· You have a reaction or allergy from the medicine. °· You feel dizzy or light-headed. °· You need stronger pain medicine. °SEEK IMMEDIATE MEDICAL CARE IF:  °· You develop a temperature of 102° F (38.9° C) or higher. °· You pass out. °· You develop leg or chest pain. °· You develop abdominal pain. °· You develop shortness of breath. °· You develop bleeding from the wound area. °· You see pus in the wound area. °MAKE SURE YOU:  °· Understand these instructions. °· Will watch your condition. °· Will get help right away if you are not doing well or get worse. °Document Released: 06/09/2004 Document Revised: 03/12/2014 Document Reviewed: 09/27/2009 °[employer]® Patient Information ©2015 [employer]. This  information is not intended to replace advice given to you by your health care provider. Make sure you discuss any questions you have with your health care provider. °

## 2018-12-27 NOTE — Transfer of Care (Signed)
Immediate Anesthesia Transfer of Care Note  Patient: Cathy Smith  Procedure(s) Performed: WIDE EXCISION VULVECTOMY (N/A ) CO2 LASER APPLICATION OF VULVA (N/A )  Patient Location: PACU  Anesthesia Type:General  Level of Consciousness: drowsy  Airway & Oxygen Therapy: Patient Spontanous Breathing and Patient connected to face mask oxygen  Post-op Assessment: Report given to RN and Post -op Vital signs reviewed and stable  Post vital signs: Reviewed and stable  Last Vitals:  Vitals Value Taken Time  BP    Temp    Pulse 63 12/27/2018 11:30 AM  Resp 11 12/27/2018 11:30 AM  SpO2 97 % 12/27/2018 11:30 AM  Vitals shown include unvalidated device data.  Last Pain:  Vitals:   12/27/18 0918  TempSrc:   PainSc: 0-No pain      Patients Stated Pain Goal: 5 (12/27/18 5852)  Complications: No apparent anesthesia complications

## 2018-12-28 ENCOUNTER — Telehealth: Payer: Self-pay

## 2018-12-28 ENCOUNTER — Encounter (HOSPITAL_BASED_OUTPATIENT_CLINIC_OR_DEPARTMENT_OTHER): Payer: Self-pay | Admitting: Gynecologic Oncology

## 2018-12-28 ENCOUNTER — Telehealth: Payer: Self-pay | Admitting: *Deleted

## 2018-12-28 ENCOUNTER — Other Ambulatory Visit: Payer: Self-pay | Admitting: Gynecologic Oncology

## 2018-12-28 DIAGNOSIS — G8918 Other acute postprocedural pain: Secondary | ICD-10-CM

## 2018-12-28 MED ORDER — OXYCODONE HCL 5 MG PO TABS: 5.0000 mg | ORAL_TABLET | ORAL | 0 refills | Status: DC | PRN

## 2018-12-28 NOTE — Progress Notes (Signed)
See RN note.  Patient stating she did not receive printed prescription from discharge yesterday. Taking tylenol and flexeril for pain at home.  Oxycodone sent to pharmacy.

## 2018-12-28 NOTE — Telephone Encounter (Signed)
Returned the patient's call from this morning. The patient stated "When I left the hospital this time I didn't get pads, those panties or the squeeze bottle. I got them last time I was in the hospital." Explained to the patient that I will get the supplies ready for her and have them at the front by 10am for her to pick up. Patient then asked "I went to pick up the pain medicine yesterday but it was to expense, it was $7 dollars. I need something cheaper or free. I have been the medicine the other doctor gave me for my legs, it helps. Tylenol is helping too. The doctor gave me cyclobenzaprine. I get that for free. Can the doctor give me that?" Message forwarded to [facility] APP.

## 2018-12-28 NOTE — Telephone Encounter (Signed)
Per previous note, patient called regarding the prescription for pain medication is too expensive.  I called [facility] on [address] but they had no record of the Percocet prescription.  When I called pt back, she reported she didn't receive the prescription, thought would be called in so her husband went up to [facility] yesterday but prescription was not there and someone at [facility] told her the cost would be $7, said the person might be new because usually her insurance pays more.  (I called [facility] and they said the printed prescription was left there and pt had not received, told Surgical [facility] to shred and Warner Mccreedy NP will send in new prescription electronically.)  Told pt she can alternate OTC Ibuprofen and Tylenol and the prescription for pain medication will be called in and can see what the cost will be. Voiced understanding and reports she has been taking Tylenol and it helps some.  Pt reports she had not received mesh panties, sitz bath, pads, and squeeze bottle like in the past.  Those items were gathered for pt and her husband will come by to pick up. No other needs per pt at this time and encouraged pt to call our office if any further concerns.

## 2018-12-29 NOTE — Anesthesia Postprocedure Evaluation (Signed)
Anesthesia Post Note  Patient: Cathy Smith  Procedure(s) Performed: WIDE EXCISION VULVECTOMY (N/A ) CO2 LASER APPLICATION OF VULVA (N/A )     Patient location during evaluation: PACU Anesthesia Type: General Level of consciousness: awake and alert Pain management: pain level controlled Vital Signs Assessment: post-procedure vital signs reviewed and stable Respiratory status: spontaneous breathing, nonlabored ventilation, respiratory function stable and patient connected to nasal cannula oxygen Cardiovascular status: blood pressure returned to baseline and stable Postop Assessment: no apparent nausea or vomiting Anesthetic complications: no    Last Vitals:  Vitals:   12/27/18 1230 12/27/18 1324  BP: (!) 143/89 138/83  Pulse: (!) 59 65  Resp: 16 16  Temp: 36.5 C 36.6 C  SpO2: 97% 97%    Last Pain:  Vitals:   12/27/18 1324  TempSrc:   PainSc: 0-No pain                 Kavita Bartl S

## 2019-01-02 ENCOUNTER — Telehealth: Payer: Self-pay

## 2019-01-02 NOTE — Telephone Encounter (Signed)
Ms Conneely states that she has been taking the senokot at bedtime since her surgery on 12-27-18. She has not had a BM since procedure. Told Ms Eavenson to get mag-citrate and take a half of the bottle and pour over ice.  If she does not have a good BM in ~3 hours to take the other half.  She needs to continue the senokot daily unless her stools become too loose.

## 2019-01-03 ENCOUNTER — Telehealth: Payer: Self-pay

## 2019-01-03 NOTE — Telephone Encounter (Signed)
Incoming call from pt - see prior nurse note.  Pt called to report she has been able to have bm after taking mag citrate.  She reports her bm was loose.  Told her if she would like she can continue Senakot stool softner every other day but not mag citrate again.  If any further bowel issues, then call us back. Pt voiced understanding.  Pt reports scant blood on tissue when wiping and would like some more of our pads and mesh panties. Otherwise pt reports she is doing well and reminded her to call our office if any concerns.  Told her I would put together some pads/mesh panties in bag for her [facility].  Appt for 3/11 changed to 11:30 am at this time instead of afternoon per patient preference. No other needs per pt at this time.

## 2019-01-09 ENCOUNTER — Telehealth: Payer: Self-pay

## 2019-01-09 NOTE — Telephone Encounter (Signed)
Outgoing call to return pt's call regarding she had some dark red blood come out when she voided yesterday.  Asked her if she feels it was vaginal blood not blood in the urine.  Pt said it is vaginal bleeding. Then today, reported bleeding as "not bad".  Reports she feels "fine".  Reports used total of 2 pads yesterday.  Reports bleeding through night was minimal.    Notified Warner Mccreedy NP. Per her, it may be from a loose or broken stitch, if bleeding picks back up like yesterday or more, then call our office so she can be scheduled with Dr Andrey Farmer tomorrow.  Pt voiced understanding- otherwise I reminded her of next Wed follow up appt here,  if her bleeding remains minimal.  No other needs per pt at this time.

## 2019-01-12 ENCOUNTER — Telehealth: Payer: Self-pay

## 2019-01-12 NOTE — Telephone Encounter (Signed)
Incoming call from patient, reports minimal bleeding, uses approx 1 pad a day, pt would like a few pads to last her until appt here next Wednesday 01-18-19.  Left less than pack of pads left up front for her husband to pick up. No other needs per pt at this time.

## 2019-01-17 ENCOUNTER — Telehealth: Payer: Self-pay | Admitting: *Deleted

## 2019-01-17 NOTE — Telephone Encounter (Signed)
Called and spoke with the patient regarding her appt for tomorrow. Moved appt from 11:15am to 11:45am, patient aware

## 2019-01-18 ENCOUNTER — Inpatient Hospital Stay: Payer: Medicare HMO | Attending: Gynecologic Oncology | Admitting: Gynecologic Oncology

## 2019-01-18 ENCOUNTER — Other Ambulatory Visit: Payer: Self-pay

## 2019-01-18 ENCOUNTER — Ambulatory Visit: Payer: Medicare HMO | Admitting: Gynecologic Oncology

## 2019-01-18 ENCOUNTER — Encounter: Payer: Self-pay | Admitting: Gynecologic Oncology

## 2019-01-18 VITALS — BP 114/78 | HR 74 | Temp 97.8°F | Resp 18 | Ht 61.0 in | Wt 152.0 lb

## 2019-01-18 DIAGNOSIS — D013 Carcinoma in situ of anus and anal canal: Secondary | ICD-10-CM | POA: Diagnosis not present

## 2019-01-18 DIAGNOSIS — D071 Carcinoma in situ of vulva: Secondary | ICD-10-CM | POA: Insufficient documentation

## 2019-01-18 DIAGNOSIS — Z79899 Other long term (current) drug therapy: Secondary | ICD-10-CM | POA: Insufficient documentation

## 2019-01-18 DIAGNOSIS — A630 Anogenital (venereal) warts: Secondary | ICD-10-CM | POA: Diagnosis not present

## 2019-01-18 DIAGNOSIS — B20 Human immunodeficiency virus [HIV] disease: Secondary | ICD-10-CM | POA: Diagnosis not present

## 2019-01-18 DIAGNOSIS — I10 Essential (primary) hypertension: Secondary | ICD-10-CM | POA: Diagnosis not present

## 2019-01-18 DIAGNOSIS — Z951 Presence of aortocoronary bypass graft: Secondary | ICD-10-CM | POA: Diagnosis not present

## 2019-01-18 DIAGNOSIS — E781 Pure hyperglyceridemia: Secondary | ICD-10-CM | POA: Diagnosis not present

## 2019-01-18 NOTE — Progress Notes (Signed)
Followup Note: Gyn-Onc   Cathy Smith 53 y.o. female  Chief Complaint  Patient presents with  . 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 S/p CO2 laser of vulva (circumferential, clitoris) with right WLE in February, 2020 for VIN 3 and AIN3. Persistent LGSIL pap, high risk HPV infection, persistent VIN.  Plan:   Will see her back in September, 2020 for inspection. Repeat Pap in March, 2021.   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 It is not realistic to expect that she will become disease free and therefore we will continue regular surveillance and biopsy of any more concerning areas.   Interval History Biopsies on February, 2020 of the anterior and posterior vulva showed VIN 3.  She was taken to the OR on 12/27/18 for a wide local excision of the anterior and posterior vulva and CO2 laser of the periclitoral tissues. 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 All lesions showed VIN 3/AIN 3.   Since surgery she has done well with no issues.   HPI: 53 year old African American female seen in consultation requested of [name], and Dr. Marice Potterove regarding management of a newly diagnosed severe dysplasia of the upper vagina. Briefly, the patient's history includes having a past history of abdominal hysterectomy for what we understand and have been fibroids. She denies any problems with Pap smears prior to the hysterectomy. More recently she has had some abnormal Pap smears ultimately resulting in colposcopy and directed biopsy on 04/07/2012. 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  The patient did well until  March 2015 with a Pap smear showing high-grade dysplasia once again. In April and May 2015 the patient received 2 additional months of Efudex. From June 2015 through February 2016 her Pap smears only showed low-grade SIL.  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 She was scheduled for a wide local excision of the right anterior vulva which was performed on 05/06/15 and revealed VIN3. Margins were positive. Intraoperative evaluation showed lesions consistent with VIN3 on bilateral posterior labia. These were biopsied (but not treated at the patient's request). These biopsies also returned as positive for VIN3.  On 07/23/15 she was taken to the OR for an extensive CO2 laser ablation of the circumferential labia minora and majora to ablate all acetowhite areas. No pathology was taken. The clitoris was spared.   Her VIN 3 recurred and on 02/25/16 she returned to the OR for CO2 laser which included the anterior vulva and clitoris.  Pap in March, 2018 showed LGSIL Pap in March 2019 showed LGSIL , high risk HPV present.   Her last Pap smear in March 2018 showed LGSIL + hrHPV.   She had open heart surgery in 2019. Since that time she has exhibited increased confusion and difficulty following instructions.   She underwent WLE right vulva and CO2 laser of anterior vulva on 05/04/17. She has no complaints.  Review of Systems:10 point review of systems is negative as noted above.   Vitals: Blood pressure 114/78, pulse 74, temperature 97.8 F (36.6 C), temperature source Oral, resp. rate 18, height 5\' 1"  (1.549 m), weight 152 lb (68.9 kg), SpO2  100 %.  Physical Exam: General : The patient is a healthy woman in no acute distress.  HEENT: normocephalic, extraoccular movements normal; neck is supple without thyromegally  Lynphnodes: Supraclavicular and inguinal nodes not enlarged  Abdomen: Soft, non-tender, no ascites, no organomegally, no masses, no hernias  Pelvic:  Well healed incisions, some  separation of wound edges on midline perineal lesion.  Vagina: Normal, no lesions.. Pap taken.   Urethra and Bladder: Normal, non-tender  Cervix: Surgically absent  Uterus: Surgically absent  Bi-manual examination: Non-tender; no adenxal masses or nodularity  Rectal: normal sphincter tone, no masses, no blood  Lower extremities: No edema or varicosities. Normal range of motion      No Known Allergies  Past Medical History:  Diagnosis Date  . Coronary artery disease   . History of condyloma acuminatum   . History of uterine fibroid   . History of vaginal dysplasia    VAIN III--  oncologist-  dr Andrey Farmer  . History of vulvar dysplasia    VIN III   gyn-oncologist-  dr Andrey Farmer  . [facility] ([facility]  dr hatcher  . Hypertension   . Hypertriglyceridemia without hypercholesterolemia   . PONV (postoperative nausea and vomiting)   . VIN III (vulvar intraepithelial neoplasia III)   . Wears glasses     Past Surgical History:  Procedure Laterality Date  . ABDOMINAL HYSTERECTOMY  1995 approx  . ANTERIOR CERVICAL DECOMP/DISCECTOMY FUSION  11-30-2009   C4 -- C6  . CO2 LASER APPLICATION Bilateral 07/23/2015   Procedure: BILATERAL CO2 LASER ABLATION OF THE VULVA;  Surgeon: Adolphus Birchwood, MD;  Location: [facility];  Service: Gynecology;  Laterality: Bilateral;  . CO2 LASER APPLICATION N/A 02/25/2016   Procedure: CO2 LASER OF THE VULVAR;  Surgeon: Adolphus Birchwood, MD;  Location: [facility];  Service: Gynecology;  Laterality: N/A;  . CO2 LASER APPLICATION N/A 05/04/2017   Procedure: CO2 LASER VAPORIZATION OF THE VULVA;  Surgeon: Adolphus Birchwood, MD;  Location: [facility];  Service: Gynecology;  Laterality: N/A;  . CO2 LASER APPLICATION N/A 12/27/2018   Procedure: CO2 LASER APPLICATION OF VULVA;  Surgeon: Adolphus Birchwood, MD;  Location: [facility];  Service: Gynecology;  Laterality: N/A;  . COLONOSCOPY  01-28-2015   . CORONARY ARTERY BYPASS GRAFT N/A 11/25/2017   Procedure: CORONARY ARTERY BYPASS GRAFTING (CABG), ON PUMP, TIMES THREE, USING LEFT INTERNAL MAMMARY ARTERY AND ENDOSCOPICALLY HARVESTED LEFT GREATER SAPHENOUS VEIN;  Surgeon: Purcell Nails, MD;  Location[facility];  Service: Open Heart Surgery;  Laterality: N/A;  . I & D LEFT BUTTOCK ABSCESS  04-28-2001  . LEFT HEART CATH AND CORONARY ANGIOGRAPHY N/A 11/22/2017   Procedure: LEFT HEART CATH AND CORONARY ANGIOGRAPHY;  Surgeon: Runell Gess, MD;  Location: [facility];  Service: Cardiovascular;  Laterality: N/A;  . TEE WITHOUT CARDIOVERSION N/A 11/25/2017   Procedure: TRANSESOPHAGEAL ECHOCARDIOGRAM (TEE);  Surgeon: Purcell Nails, MD;  Location[facility] OR;  Service: Open Heart Surgery;  Laterality: N/A;  . VULVECTOMY Right 05/06/2015   Procedure: WIDE LOCAL  EXCISION  OF RIGHT VULVA;  Surgeon: Adolphus Birchwood, MD;  Location: [facility] ;  Service: Gynecology;  Laterality: Right;  Marland Kitchen VULVECTOMY N/A 05/04/2017   Procedure: WIDE EXCISION VULVECTOMY;  Surgeon: Adolphus Birchwood, MD;  Location: [facility];  Service: Gynecology;  Laterality: N/A;  . VULVECTOMY N/A 12/27/2018   Procedure: WIDE EXCISION VULVECTOMY;  Surgeon: Adolphus Birchwood, MD;  Location: [facility];  Service: Gynecology;  Laterality: N/A;    Current Outpatient Medications  Medication Sig Dispense Refill  . acetaminophen (TYLENOL) 325 MG tablet Take 650 mg by mouth every 6 (six) hours as needed.    Marland Kitchen aspirin EC 325 MG EC tablet Take 1 tablet (325 mg total) by mouth daily. 30 tablet 0  . atorvastatin (LIPITOR) 20 MG tablet Take 1 tablet (20 mg total) by mouth daily at 6 PM. 90 tablet 3  . darunavir (PREZISTA) 800 MG tablet TAKE 1 TABLET(800 MG) BY MOUTH DAILY 30 tablet 5  . elvitegravir-cobicistat-emtricitabine-tenofovir (GENVOYA) 150-150-200-10 MG TABS tablet Take 1 tablet by mouth daily with breakfast. 30 tablet 5  . furosemide (LASIX) 40 MG tablet  TAKE 1 TABLET(40 MG) BY MOUTH DAILY 90 tablet 3  . oxyCODONE (OXY IR/ROXICODONE) 5 MG immediate release tablet Take 1-2 tablets (5-10 mg total) by mouth every 4 (four) hours as needed for severe pain. Do not take and drive 20 tablet 0  . senna (SENOKOT) 8.6 MG TABS tablet Take 1 tablet (8.6 mg total) by mouth at bedtime. 120 each 0  . metoprolol tartrate (LOPRESSOR) 25 MG tablet Take 1.5 tablets (37.5 mg total) by mouth 2 (two) times daily. 270 tablet 3   No current facility-administered medications for this visit.     Social History   Socioeconomic History  . Marital status: Married    Spouse name: Not on file  . Number of children: Not on file  . Years of education: Not on file  . Highest education level: Not on file  Occupational History  . Not on file  Social Needs  . Financial resource strain: Not on file  . Food insecurity:    Worry: Not on file    Inability: Not on file  . Transportation needs:    Medical: Not on file    Non-medical: Not on file  Tobacco Use  . Smoking status: Never Smoker  . Smokeless tobacco: Never Used  Substance and Sexual Activity  . Alcohol use: No    Alcohol/week: 0.0 standard drinks  . Drug use: No  . Sexual activity: Yes    Partners: Male    Birth control/protection: Condom    Comment: pt. given condoms  Lifestyle  . Physical activity:    Days per week: Not on file    Minutes per session: Not on file  . Stress: Not on file  Relationships  . Social connections:    Talks on phone: Not on file    Gets together: Not on file    Attends religious service: Not on file    Active member of club or organization: Not on file    Attends meetings of clubs or organizations: Not on file    Relationship status: Not on file  . Intimate partner violence:    Fear of current or ex partner: Not on file    Emotionally abused: Not on file    Physically abused: Not on file    Forced sexual activity: Not on file  Other Topics Concern  . Not on file   Social History Narrative  . Not on file    Family History  Problem Relation Age of Onset  . Cataracts Mother   . Hypertension Father   . CAD Paternal Grandfather   . Colon cancer Neg Hx    [name], MD 01/18/2019, 12:36 PM

## 2019-01-18 NOTE — Patient Instructions (Addendum)
Please return to see Dr Andrey Farmer in September, 2020 to evaluate your vulva.  Please contact Dr Oliver Hum office (at [phone]) in May, 2020 to request an appointment with her for September, 2020.  You can apply the cream to the area between your anus and vagina.

## 2019-02-14 ENCOUNTER — Ambulatory Visit (INDEPENDENT_AMBULATORY_CARE_PROVIDER_SITE_OTHER): Payer: Medicare HMO | Admitting: Family Medicine

## 2019-02-14 ENCOUNTER — Other Ambulatory Visit: Payer: Self-pay

## 2019-02-14 DIAGNOSIS — B20 Human immunodeficiency virus [HIV] disease: Secondary | ICD-10-CM | POA: Diagnosis not present

## 2019-02-14 DIAGNOSIS — I10 Essential (primary) hypertension: Secondary | ICD-10-CM

## 2019-02-14 DIAGNOSIS — E785 Hyperlipidemia, unspecified: Secondary | ICD-10-CM

## 2019-02-14 DIAGNOSIS — Z09 Encounter for follow-up examination after completed treatment for conditions other than malignant neoplasm: Secondary | ICD-10-CM | POA: Diagnosis not present

## 2019-02-14 DIAGNOSIS — D071 Carcinoma in situ of vulva: Secondary | ICD-10-CM

## 2019-02-14 DIAGNOSIS — M79605 Pain in left leg: Secondary | ICD-10-CM

## 2019-02-14 MED ORDER — FUROSEMIDE 40 MG PO TABS: ORAL_TABLET | ORAL | 3 refills | Status: DC

## 2019-02-14 NOTE — Progress Notes (Signed)
Virtual Visit via Telephone Note  I connected with Cathy Smith on 02/20/19 at 10:40 AM EDT by telephone and verified that I am speaking with the correct person using two identifiers.   I discussed the limitations, risks, security and privacy concerns of performing an evaluation and management service by telephone and the availability of in person appointments. I also discussed with the patient that there may be a patient responsible charge related to this service. The patient expressed understanding and agreed to proceed.   History of Present Illness:  Past Medical History:  Diagnosis Date  . Coronary artery disease   . History of condyloma acuminatum   . History of uterine fibroid   . History of vaginal dysplasia    VAIN III--  oncologist-  Cathy Smith  . History of vulvar dysplasia    VIN III   gyn-oncologist-  Cathy Smith  . HIV infection (H[facility]) [facility]-  Cathy Smith  . Hypertension   . Hypertriglyceridemia without hypercholesterolemia   . PONV (postoperative nausea and vomiting)   . VIN III (vulvar intraepithelial neoplasia III)   . Wears glasses     Current Outpatient Medications on File Prior to Visit  Medication Sig Dispense Refill  . acetaminophen (TYLENOL) 325 MG tablet Take 650 mg by mouth every 6 (six) hours as needed.    Marland Kitchen. aspirin EC 325 MG EC tablet Take 1 tablet (325 mg total) by mouth daily. 30 tablet 0  . atorvastatin (LIPITOR) 20 MG tablet Take 1 tablet (20 mg total) by mouth daily at 6 PM. 90 tablet 3  . darunavir (PREZISTA) 800 MG tablet TAKE 1 TABLET(800 MG) BY MOUTH DAILY 30 tablet 5  . elvitegravir-cobicistat-emtricitabine-tenofovir (GENVOYA) 150-150-200-10 MG TABS tablet Take 1 tablet by mouth daily with breakfast. 30 tablet 5  . metoprolol tartrate (LOPRESSOR) 25 MG tablet Take 1.5 tablets (37.5 mg total) by mouth 2 (two) times daily. 270 tablet 3   No current facility-administered medications on file prior to visit.     Current  Status: Since her last office visit, she has had  Wide excision of Vulectomy on 12/27/2018, performed by Cathy. Adolphus BirchwoodEmma Smith. Today she healed well and has no complaints. She continues to follow up with Infection Disease as needed.  She has chronic left leg pain. She denies visual changes, chest pain, cough, shortness of breath, heart palpitations, and falls. She has occasional headaches and dizziness with position changes. Denies severe headaches, confusion, seizures, double vision, and blurred vision, nausea and vomiting. She denies fevers, chills, fatigue, recent infections, weight loss, and night sweats. Her anxiety is mild today.   No reports of GI problems such as nausea, vomiting, diarrhea, and constipation. She has no reports of blood in stools, dysuria and hematuria.  Observations/Objective:  Telephone Virtual Visit.    Assessment and Plan:  1. VIN III (vulvar intraepithelial neoplasia III) S/p: 12/2018. She reports that she is doing well with minimal pain and discomfort. She will follow up with Cathy. Andrey Farmerossi as needed.   2. Essential hypertension She will continue to decrease high sodium intake, excessive alcohol intake, increase potassium intake, smoking cessation, and increase physical activity of at least 30 minutes of cardio activity daily. She will continue to follow Heart Healthy or DASH diet. - furosemide (LASIX) 40 MG tablet; TAKE 1 TABLET(40 MG) BY MOUTH DAILY  Dispense: 90 tablet; Refill: 3  3. Left leg pain She continues Acetaminophen as needed for pain.   4. Hyperlipidemia, unspecified hyperlipidemia type  Continue Atorvastatin as prescribed.   5. Human immunodeficiency virus (HIV) disease (HCC) Continue antiviral medications as prescribed. Continue to follow up with Infection Disease.   Meds ordered this encounter  Medications  . furosemide (LASIX) 40 MG tablet    Sig: TAKE 1 TABLET(40 MG) BY MOUTH DAILY    Dispense:  90 tablet    Refill:  3    No orders of the defined  types were placed in this encounter.   Referral Orders  No referral(s) requested today    Cathy Ip,  MSN, FNP-C [facility] [address] [place], [place] [address] [phone]   Follow Up Instructions:  She will follow up in 6 months.    I discussed the assessment and treatment plan with the patient. The patient was provided an opportunity to ask questions and all were answered. The patient agreed with the plan and demonstrated an understanding of the instructions.   The patient was advised to call back or seek an in-person evaluation if the symptoms worsen or if the condition fails to improve as anticipated.  I provided 15-20 minutes of non-face-to-face time during this encounter.   Cathy Locks, FNP

## 2019-02-17 ENCOUNTER — Telehealth: Payer: Self-pay | Admitting: *Deleted

## 2019-02-17 NOTE — Telephone Encounter (Signed)
Patient call

## 2019-02-21 ENCOUNTER — Ambulatory Visit: Payer: Medicare HMO | Admitting: Cardiovascular Disease

## 2019-02-28 ENCOUNTER — Telehealth: Payer: Self-pay | Admitting: *Deleted

## 2019-02-28 ENCOUNTER — Telehealth: Payer: Self-pay | Admitting: Cardiovascular Disease

## 2019-02-28 ENCOUNTER — Encounter: Payer: Self-pay | Admitting: *Deleted

## 2019-02-28 NOTE — Telephone Encounter (Signed)
Virtual Visit Pre-Appointment Phone Call  "(Name), I am calling you today to discuss your upcoming appointment. We are currently trying to limit exposure to the virus that causes COVID-19 by seeing patients at home rather than in the office."  1. "What is the BEST phone number to call the day of the visit?" - include this in appointment notes  2. Do you have or have access to (through a family member/friend) a smartphone with video capability that we can use for your visit?" a. If yes - list this number in appt notes as cell (if different from BEST phone #) and list the appointment type as a VIDEO visit in appointment notes b. If no - list the appointment type as a PHONE visit in appointment notes  3. Confirm consent - "In the setting of the current Covid19 crisis, you are scheduled for a (phone or video) visit with your provider on (date) at (time).  Just as we do with many in-office visits, in order for you to participate in this visit, we must obtain consent.  If you'd like, I can send this to your mychart (if signed up) or email for you to review.  Otherwise, I can obtain your verbal consent now.  All virtual visits are billed to your insurance company just like a normal visit would be.  By agreeing to a virtual visit, we'd like you to understand that the technology does not allow for your provider to perform an examination, and thus may limit your provider's ability to fully assess your condition. If your provider identifies any concerns that need to be evaluated in person, we will make arrangements to do so.  Finally, though the technology is pretty good, we cannot assure that it will always work on either your or our end, and in the setting of a video visit, we may have to convert it to a phone-only visit.  In either situation, we cannot ensure that we have a secure connection.  Are you willing to proceed?" STAFF: Did the patient verbally acknowledge consent to telehealth visit? Document  YES/NO : YES  4. Advise patient to be prepared - "Two hours prior to your appointment, go ahead and check your blood pressure, pulse, oxygen saturation, and your weight (if you have the equipment to check those) and write them all down. When your visit starts, your provider will ask you for this information. If you have an Apple Watch or Kardia device, please plan to have heart rate information ready on the day of your appointment. Please have a pen and paper handy nearby the day of the visit as well."  5. Give patient instructions for MyChart download to smartphone OR Doximity/Doxy.me as below if video visit (depending on what platform provider is using)  6. Inform patient they will receive a phone call 15 minutes prior to their appointment time (may be from unknown caller ID) so they should be prepared to answer    TELEPHONE CALL NOTE  Cathy Smith has been deemed a candidate for a follow-up tele-health visit to limit community exposure during the Covid-19 pandemic. I spoke with the patient via phone to ensure availability of phone/video source, confirm preferred email & phone number, and discuss instructions and expectations.  I reminded WALLIS MINARDI to be prepared with any vital sign and/or heart rhythm information that could potentially be obtained via home monitoring, at the time of her visit. I reminded KEMBERLEY LUCCI to expect a phone call prior to  her visit.  Raelyn Number, CMA 02/28/2019 2:22 PM   INSTRUCTIONS FOR DOWNLOADING THE MYCHART APP TO SMARTPHONE  - The patient must first make sure to have activated MyChart and know their login information - If Apple, go to [employer] and type in MyChart in the search bar and download the app. If Android, ask patient to go to [employer] and type in [place] in the search bar and download the app. The app is free but as with any other app downloads, their phone may require them to verify saved payment information or Apple/Android  password.  - The patient will need to then log into the app with their MyChart username and password, and select [facility] as their healthcare provider to link the account. When it is time for your visit, go to the MyChart app, find appointments, and click Begin Video Visit. Be sure to Select Allow for your device to access the Microphone and Camera for your visit. You will then be connected, and your provider will be with you shortly.  **If they have any issues connecting, or need assistance please contact MyChart service desk [phone])**  **If using a computer, in order to ensure the best quality for their visit they will need to use either of the following Internet Browsers: [employer] or Google Chrome**  IF USING DOXIMITY or DOXY.ME - The patient will receive a link just prior to their visit by text.     FULL LENGTH CONSENT FOR TELE-HEALTH VISIT   I hereby voluntarily request, consent and authorize [facility] and its employed or contracted physicians, physician assistants, nurse practitioners or other licensed health care professionals (the Practitioner), to provide me with telemedicine health care services (the Services") as deemed necessary by the treating Practitioner. I acknowledge and consent to receive the Services by the Practitioner via telemedicine. I understand that the telemedicine visit will involve communicating with the Practitioner through live audiovisual communication technology and the disclosure of certain medical information by electronic transmission. I acknowledge that I have been given the opportunity to request an in-person assessment or other available alternative prior to the telemedicine visit and am voluntarily participating in the telemedicine visit.  I understand that I have the right to withhold or withdraw my consent to the use of telemedicine in the course of my care at any time, without affecting my right to future care or treatment,  and that the Practitioner or I may terminate the telemedicine visit at any time. I understand that I have the right to inspect all information obtained and/or recorded in the course of the telemedicine visit and may receive copies of available information for a reasonable fee.  I understand that some of the potential risks of receiving the Services via telemedicine include:   Delay or interruption in medical evaluation due to technological equipment failure or disruption;  Information transmitted may not be sufficient (e.g. poor resolution of images) to allow for appropriate medical decision making by the Practitioner; and/or   In rare instances, security protocols could fail, causing a breach of personal health information.  Furthermore, I acknowledge that it is my responsibility to provide information about my medical history, conditions and care that is complete and accurate to the best of my ability. I acknowledge that Practitioner's advice, recommendations, and/or decision may be based on factors not within their control, such as incomplete or inaccurate data provided by me or distortions of diagnostic images or specimens that may result from electronic transmissions.  I understand that the practice of medicine is not an exact science and that Practitioner makes no warranties or guarantees regarding treatment outcomes. I acknowledge that I will receive a copy of this consent concurrently upon execution via email to the email address I last provided but may also request a printed copy by calling the office of [facility].    I understand that my insurance will be billed for this visit.   I have read or had this consent read to me.  I understand the contents of this consent, which adequately explains the benefits and risks of the Services being provided via telemedicine.   I have been provided ample opportunity to ask questions regarding this consent and the Services and have had my questions  answered to my satisfaction.  I give my informed consent for the services to be provided through the use of telemedicine in my medical care  By participating in this telemedicine visit I agree to the above.       Cardiac Questionnaire:    Since your last visit or hospitalization:    1. Have you been having new or worsening chest pain? NO   2. Have you been having new or worsening shortness of breath? NO 3. Have you been having new or worsening leg swelling, wt gain, or increase in abdominal girth (pants fitting more tightly)? NO   4. Have you had any passing out spells? NO    *A YES to any of these questions would result in the appointment being kept. *If all the answers to these questions are NO, we should indicate that given the current situation regarding the worldwide coronarvirus pandemic, at the recommendation of the CDC, we are looking to limit gatherings in our waiting area, and thus will reschedule their appointment beyond four weeks from today.   _____________   COVID-19 Pre-Screening Questions:   Do you currently have a fever? NO  Have you recently travelled on a cruise, internationally, or to [place], [place], [place], [place], [place], or [place] [employer])? NO  Have you been in contact with someone that is currently pending confirmation of Covid19 testing or has been confirmed to have the Covid19 virus? NO  Are you currently experiencing fatigue or cough? NO       Spoke with patient and we reviewed her medications, allergies, pharmacy, and history.

## 2019-02-28 NOTE — Telephone Encounter (Signed)
Call smartphone/  Virtual consent/ my chart via email/ pre reg completed

## 2019-03-01 ENCOUNTER — Telehealth: Payer: Self-pay

## 2019-03-01 ENCOUNTER — Telehealth: Payer: Medicare HMO | Admitting: Cardiovascular Disease

## 2019-03-01 ENCOUNTER — Telehealth (INDEPENDENT_AMBULATORY_CARE_PROVIDER_SITE_OTHER): Payer: Medicare HMO | Admitting: Cardiovascular Disease

## 2019-03-01 VITALS — Ht 61.0 in | Wt 148.0 lb

## 2019-03-01 DIAGNOSIS — E781 Pure hyperglyceridemia: Secondary | ICD-10-CM | POA: Diagnosis not present

## 2019-03-01 NOTE — Patient Instructions (Signed)
Medication Instructions:  Your physician recommends that you continue on your current medications as directed. Please refer to the Current Medication list given to you today.  If you need a refill on your cardiac medications before your next appointment, please call your pharmacy.   Lab work: Your physician recommends that you return for lab work in 4-8 weeks: FASTING LIPID PROFILE. YOU WILL RECEIVE A LAB SLIP IN THE MAIL. NO APPOINTMENT IS NEEDED. NO FOOD OR DRINK EXCEPT FOR WATER AFTER MIDNIGHT ON THE DAY THAT YOU CHOOSE TO PRESENT FOR LAB WORK.  If you have labs (blood work) drawn today and your tests are completely normal, you will receive your results only by: Marland Kitchen MyChart Message (if you have MyChart) OR . A paper copy in the mail If you have any lab test that is abnormal or we need to change your treatment, we will call you to review the results.  Testing/Procedures: NONE  Follow-Up: At [facility], you and your health needs are our priority.  As part of our continuing mission to provide you with exceptional heart care, we have created designated Provider Care Teams.  These Care Teams include your primary Cardiologist (physician) and Advanced Practice Providers (APPs -  Physician Assistants and Nurse Practitioners) who all work together to provide you with the care you need, when you need it. You will need a follow up appointment in 12 months WITH DR. Allyson Sabal.  Please call our office 2 months in advance to schedule this appointment.

## 2019-03-01 NOTE — Progress Notes (Signed)
Virtual Visit via Video Note   This visit type was conducted due to national recommendations for restrictions regarding the COVID-19 Pandemic (e.g. social distancing) in an effort to limit this patient's exposure and mitigate transmission in our community.  Due to her co-morbid illnesses, this patient is at least at moderate risk for complications without adequate follow up.  This format is felt to be most appropriate for this patient at this time.  All issues noted in this document were discussed and addressed.  A limited physical exam was performed with this format.  Please refer to the patient's chart for her consent to telehealth for [facility].   Evaluation Performed:  Follow-up visit  Date:  03/01/2019   ID:  Cathy Smith, DOB 22-Dec-1965, MRN [id number]  Patient Location: Home Provider Location: Home  PCP:  Kallie Locks, FNP  Cardiologist:  Nanetta Batty, MD  Electrophysiologist:  None   Chief Complaint: Routine six-month follow-up  History of Present Illness:    Cathy Smith is a 53 y.o.  moderately overweight married African-American female with no children. Does not work. She was referred to me byKimberly Harris FNPfor new onset chest pain.I last saw her in the office  09/06/2018. Marland KitchenShe really has no cardiac risk factors other than hypertension hyperlipidemia both of which are treated. There is no family history. She does not smoke. She had onset of chest pain 3-4 months ago,, currently trying to 3 times a week lasting up to 5-10 minutes at a time associated with shortness of breath.she had a Myoview stress test that showed severe ischemia in the LAD territory. She presentsunderwent cardiac catheterization by myself 11/22/17 revealing left main/three-vessel disease with preserved LV function. She underwent coronary artery bypass graft 3  11/25/17 by Dr. Ashley Mariner with a LIMA to LAD, vein to obtuse marginal branch and Vein to the posterior lateral branch of the RCA. Her  postoperative course was uncomplicated.  She completed the cardiac rehab program.  Since I saw her back in the office 6 months ago she is remained asymptomatic denying chest pain or shortness of breath.  Her most recent lipid profile performed 12/07/2017 revealed total cholesterol 92, LDL 29 and HDL of 28.  The patient does not have symptoms concerning for COVID-19 infection (fever, chills, cough, or new shortness of breath).    Past Medical History:  Diagnosis Date  . Coronary artery disease   . History of condyloma acuminatum   . History of uterine fibroid   . History of vaginal dysplasia    VAIN III--  oncologist-  dr Andrey Farmer  . History of vulvar dysplasia    VIN III   gyn-oncologist-  dr Andrey Farmer  . [facility] infection ([facility]  dr hatcher  . Hypertension   . Hypertriglyceridemia without hypercholesterolemia   . PONV (postoperative nausea and vomiting)   . VIN III (vulvar intraepithelial neoplasia III)   . Wears glasses    Past Surgical History:  Procedure Laterality Date  . ABDOMINAL HYSTERECTOMY  1995 approx  . ANTERIOR CERVICAL DECOMP/DISCECTOMY FUSION  11-30-2009   C4 -- C6  . CO2 LASER APPLICATION Bilateral 07/23/2015   Procedure: BILATERAL CO2 LASER ABLATION OF THE VULVA;  Surgeon: Adolphus Birchwood, MD;  Location[facility];  Service: Gynecology;  Laterality: Bilateral;  . CO2 LASER APPLICATION N/A 02/25/2016   Procedure: CO2 LASER OF THE VULVAR;  Surgeon: Adolphus Birchwood, MD;  Location: [facility] [place];  Service: Gynecology;  Laterality:  N/A;  . CO2 LASER APPLICATION N/A 05/04/2017   Procedure: CO2 LASER VAPORIZATION OF THE VULVA;  Surgeon: Adolphus Birchwoodossi, Emma, MD;  [facility] [place];  Service: Gynecology;  Laterality: N/A;  . CO2 LASER APPLICATION N/A 12/27/2018   Procedure: CO2 LASER APPLICATION OF VULVA;  Surgeon: Adolphus Birchwoodossi, Emma, MD;  [facility] [place];  Service: Gynecology;  Laterality: N/A;  .  COLONOSCOPY  01-28-2015  . CORONARY ARTERY BYPASS GRAFT N/A 11/25/2017   Procedure: CORONARY ARTERY BYPASS GRAFTING (CABG), ON PUMP, TIMES THREE, USING LEFT INTERNAL MAMMARY ARTERY AND ENDOSCOPICALLY HARVESTED LEFT GREATER SAPHENOUS VEIN;  Surgeon: Purcell Nailswen, Clarence H, MD;  [facility];  Service: Open Heart Surgery;  Laterality: N/A;  . I & D LEFT BUTTOCK ABSCESS  04-28-2001  . LEFT HEART CATH AND CORONARY ANGIOGRAPHY N/A 11/22/2017   Procedure: LEFT HEART CATH AND CORONARY ANGIOGRAPHY;  Surgeon: Runell GessBerry, Damya Comley J, MD;  [facility]: [facility];  Service: Cardiovascular;  Laterality: N/A;  . TEE WITHOUT CARDIOVERSION N/A 11/25/2017   Procedure: TRANSESOPHAGEAL ECHOCARDIOGRAM (TEE);  Surgeon: Purcell Nailswen, Clarence H, MD;  [facility];  Service: Open Heart Surgery;  Laterality: N/A;  . VULVECTOMY Right 05/06/2015   Procedure: WIDE LOCAL  EXCISION  OF RIGHT VULVA;  Surgeon: Adolphus BirchwoodEmma Rossi, MD;  [facility] [place];  Service: Gynecology;  Laterality: Right;  Marland Kitchen. VULVECTOMY N/A 05/04/2017   Procedure: WIDE EXCISION VULVECTOMY;  Surgeon: Adolphus Birchwoodossi, Emma, MD;  [facility] [place];  Service: Gynecology;  Laterality: N/A;  . VULVECTOMY N/A 12/27/2018   Procedure: WIDE EXCISION VULVECTOMY;  Surgeon: Adolphus Birchwoodossi, Emma, MD;  [facility] [place];  Service: Gynecology;  Laterality: N/A;     Current Meds  Medication Sig  . acetaminophen (TYLENOL) 325 MG tablet Take 650 mg by mouth every 6 (six) hours as needed.  Marland Kitchen. aspirin EC 325 MG EC tablet Take 1 tablet (325 mg total) by mouth daily.  Marland Kitchen. atorvastatin (LIPITOR) 20 MG tablet Take 1 tablet (20 mg total) by mouth daily at 6 PM.  . darunavir (PREZISTA) 800 MG tablet TAKE 1 TABLET(800 MG) BY MOUTH DAILY  . elvitegravir-cobicistat-emtricitabine-tenofovir (GENVOYA) 150-150-200-10 MG TABS tablet Take 1 tablet by mouth daily with breakfast.  . furosemide (LASIX) 40 MG tablet TAKE 1 TABLET(40 MG) BY MOUTH DAILY     Allergies:    Patient has no known allergies.   Social History   Tobacco Use  . Smoking status: Never Smoker  . Smokeless tobacco: Never Used  Substance Use Topics  . Alcohol use: No    Alcohol/week: 0.0 standard drinks  . Drug use: No     Family Hx: The patient's family history includes CAD in her paternal grandfather; Cataracts in her mother; Hypertension in her father. There is no history of Colon cancer.  ROS:   Please see the history of present illness.     All other systems reviewed and are negative.   Prior CV studies:   The following studies were reviewed today:  None  Labs/Other Tests and Data Reviewed:    EKG:  No ECG reviewed.  Recent Labs: 05/09/2018: ALT 63; Hemoglobin 12.1; Platelets 162 12/27/2018: BUN 14; Creatinine, Ser 0.91; Potassium 3.5; Sodium 139   Recent Lipid Panel Lab Results  Component Value Date/Time   CHOL 92 (L) 12/07/2017 09:13 AM   TRIG 176 (H) 12/07/2017 09:13 AM   HDL 28 (L) 12/07/2017 09:13 AM   CHOLHDL 3.3 12/07/2017 09:13 AM   CHOLHDL 4.9 01/27/2017 02:12  PM   LDLCALC 29 12/07/2017 09:13 AM    Wt Readings from Last 3 Encounters:  03/01/19 148 lb (67.1 kg)  01/18/19 152 lb (68.9 kg)  12/27/18 153 lb 9.6 oz (69.7 kg)     Objective:    Vital Signs:  Ht 5\' 1"  (1.549 m)   Wt 148 lb (67.1 kg)   BMI 27.96 kg/m    VITAL SIGNS:  reviewed GEN:  no acute distress RESPIRATORY:  normal respiratory effort, symmetric expansion NEURO:  alert and oriented x 3, no obvious focal deficit PSYCH:  normal affect  ASSESSMENT & PLAN:    1. Coronary artery disease- history of CAD status post CABG by Dr. Cornelius Moras 11/25/2017 after cath performed 3 days earlier showed left main/three-vessel disease with normal LV function.  She had LIMA to LAD, vein to an obtuse marginal branch and PDA.  She participating cardiac rehab and is currently asymptomatic. 2. Hyperlipidemia- history of hyperlipidemia on atorvastatin with lipid profile performed 12/07/2017 revealing total  cholesterol 92, LDL 29 and HDL 28 3. Essential hypertension- history of essential hypertension on metoprolol.  She did not check her blood pressure today.  COVID-19 Education: The signs and symptoms of COVID-19 were discussed with the patient and how to seek care for testing (follow up with PCP or arrange E-visit).  The importance of social distancing was discussed today.  Time:   Today, I have spent 6 minutes with the patient with telehealth technology discussing the above problems.     Medication Adjustments/Labs and Tests Ordered: Current medicines are reviewed at length with the patient today.  Concerns regarding medicines are outlined above.   Tests Ordered: Orders Placed This Encounter  Procedures  . Lipid panel    Medication Changes: No orders of the defined types were placed in this encounter.   Disposition:  Follow up in 1 year(s)  Signed, Nanetta Batty, MD  03/01/2019 10:59 AM    [facility]

## 2019-03-01 NOTE — Telephone Encounter (Signed)
lmtcb to review AVS instructions. AVS released to [employer]

## 2019-04-05 ENCOUNTER — Telehealth: Payer: Self-pay

## 2019-04-06 NOTE — Telephone Encounter (Signed)
Patient states that her right leg has been hurting when she gets up from sitting and if she has been standing for a period of time. Patient also has the pain when she is laying down. Patient is aware that you are out of office today

## 2019-04-07 ENCOUNTER — Other Ambulatory Visit: Payer: Self-pay | Admitting: Family Medicine

## 2019-04-07 ENCOUNTER — Ambulatory Visit (HOSPITAL_COMMUNITY): Admission: RE | Admit: 2019-04-07 | Payer: Medicare HMO | Source: Ambulatory Visit

## 2019-04-07 ENCOUNTER — Other Ambulatory Visit: Payer: Self-pay

## 2019-04-07 ENCOUNTER — Ambulatory Visit (INDEPENDENT_AMBULATORY_CARE_PROVIDER_SITE_OTHER): Payer: Medicare HMO | Admitting: Family Medicine

## 2019-04-07 DIAGNOSIS — M79604 Pain in right leg: Secondary | ICD-10-CM | POA: Diagnosis not present

## 2019-04-07 DIAGNOSIS — R0989 Other specified symptoms and signs involving the circulatory and respiratory systems: Secondary | ICD-10-CM

## 2019-04-07 DIAGNOSIS — Z09 Encounter for follow-up examination after completed treatment for conditions other than malignant neoplasm: Secondary | ICD-10-CM

## 2019-04-07 NOTE — Progress Notes (Signed)
Virtual Visit via Telephone Note  I connected with Cathy Smith on 04/07/19 at  2:00 PM EDT by telephone and verified that I am speaking with the correct person using two identifiers.   I discussed the limitations, risks, security and privacy concerns of performing an evaluation and management service by telephone and the availability of in person appointments. I also discussed with the patient that there may be a patient responsible charge related to this service. The patient expressed understanding and agreed to proceed.  History of Present Illness:  Past Medical History:  Diagnosis Date  . Coronary artery disease   . History of condyloma acuminatum   . History of uterine fibroid   . History of vaginal dysplasia    VAIN III--  oncologist-  dr Andrey Farmer  . History of vulvar dysplasia    VIN III   gyn-oncologist-  dr Andrey Farmer  . [facility] infection ([facility]) [facility]  dr hatcher  . Hypertension   . Hypertriglyceridemia without hypercholesterolemia   . PONV (postoperative nausea and vomiting)   . VIN III (vulvar intraepithelial neoplasia III)   . Wears glasses     Current Outpatient Medications on File Prior to Visit  Medication Sig Dispense Refill  . acetaminophen (TYLENOL) 325 MG tablet Take 650 mg by mouth every 6 (six) hours as needed.    Marland Kitchen aspirin EC 325 MG EC tablet Take 1 tablet (325 mg total) by mouth daily. 30 tablet 0  . atorvastatin (LIPITOR) 20 MG tablet Take 1 tablet (20 mg total) by mouth daily at 6 PM. 90 tablet 3  . darunavir (PREZISTA) 800 MG tablet TAKE 1 TABLET(800 MG) BY MOUTH DAILY 30 tablet 5  . elvitegravir-cobicistat-emtricitabine-tenofovir (GENVOYA) 150-150-200-10 MG TABS tablet Take 1 tablet by mouth daily with breakfast. 30 tablet 5  . furosemide (LASIX) 40 MG tablet TAKE 1 TABLET(40 MG) BY MOUTH DAILY 90 tablet 3  . metoprolol tartrate (LOPRESSOR) 25 MG tablet Take 1.5 tablets (37.5 mg total) by mouth 2 (two) times daily. 270 tablet 3   No  current facility-administered medications on file prior to visit.      Current Status: Since her last office visit, she has c/o right lower leg pain X 3 days now. She reports dull ache, warm to touch, swelling, and increasing pain after long periods of sitting. She has not used any remedies or taken any medications for pain relief. Denies sudden onset of dyspnea and coughing. Denies Productive frothy, pink-tinged sputum. Denies tachycardia, pallor, feelings of impending doom. Denies symptoms of DVT, history of A-fib, any recent surgeries, pregnancy, long-bone fractures, but she is positive for prolonged inactivity (sedentary, long distant traveling).   She denies fevers, chills, fatigue, recent infections, weight loss, and night sweats. She has not had any headaches, visual changes, dizziness, and falls. No chest pain, heart palpitations, cough and shortness of breath reported. No reports of GI problems such as nausea, vomiting, diarrhea, and constipation. She has no reports of blood in stools, dysuria and hematuria. No depression or anxiety reported today.   Observations/Objective:  Telephone Virtual Visit   Assessment and Plan:  1. Acute right leg pain Order for STAT Doppler Ultrasound to r/o DVT.  - VAS [place] LOWER EXTREMITY VENOUS (DVT); Future  2. Suspected DVT   Follow Up Instructions:  Keep previously scheduled follow up appointment.   I discussed the assessment and treatment plan with the patient. The patient was provided an opportunity to ask questions and all were answered. The  patient agreed with the plan and demonstrated an understanding of the instructions.   The patient was advised to call back or seek an in-person evaluation if the symptoms worsen or if the condition fails to improve as anticipated.  I provided 15 minutes of non-face-to-face time during this encounter.   Kallie LocksNatalie M Livier Hendel, FNP

## 2019-04-09 DIAGNOSIS — R0989 Other specified symptoms and signs involving the circulatory and respiratory systems: Secondary | ICD-10-CM | POA: Insufficient documentation

## 2019-04-12 ENCOUNTER — Ambulatory Visit (HOSPITAL_COMMUNITY)
Admission: RE | Admit: 2019-04-12 | Discharge: 2019-04-12 | Disposition: A | Discharge status: Home / Self Care | Payer: Medicare HMO | Source: Ambulatory Visit | Attending: Family Medicine | Admitting: Family Medicine

## 2019-04-12 ENCOUNTER — Other Ambulatory Visit: Payer: Self-pay

## 2019-04-12 DIAGNOSIS — M79604 Pain in right leg: Secondary | ICD-10-CM | POA: Diagnosis present

## 2019-04-12 NOTE — Progress Notes (Signed)
Lower extremity venous has been completed.   Preliminary results in CV Proc.   Blanch Media 04/12/2019 10:23 AM

## 2019-04-17 ENCOUNTER — Telehealth: Payer: Self-pay

## 2019-04-17 NOTE — Telephone Encounter (Signed)
Called, spoke with patient. Advised of previous message. Thanks!

## 2019-04-17 NOTE — Telephone Encounter (Signed)
Called, no answer. Left a message that scan was negative and that she should continue doing the remedies she was advised here in office. Advised that we are mailing some additional material to her house and to look for it in the mail. Asked if any questions to call our office. Thanks!

## 2019-04-17 NOTE — Telephone Encounter (Signed)
-----   Message from Azzie Glatter, [place] sent at 04/17/2019 11:30 AM EDT ----- No evidence of DVT noted on doppler scan. Continue remedies discuss. Additional written material mailed to patient today. Please inform patient.

## 2019-04-19 ENCOUNTER — Other Ambulatory Visit: Payer: Self-pay | Admitting: Cardiovascular Disease

## 2019-04-19 ENCOUNTER — Other Ambulatory Visit: Payer: Self-pay | Admitting: Infectious Diseases

## 2019-04-19 DIAGNOSIS — B20 Human immunodeficiency virus [HIV] disease: Secondary | ICD-10-CM

## 2019-04-19 NOTE — Telephone Encounter (Signed)
Left a voice message for the patient to verify which pharmacy to send the refill request to. Will try calling again.

## 2019-04-19 NOTE — Telephone Encounter (Signed)
Called again and she confirmed to send to the pharmacy in [place]

## 2019-04-27 ENCOUNTER — Telehealth: Payer: Self-pay

## 2019-04-27 NOTE — Telephone Encounter (Signed)
Patient has some more questions regarding her leg pain. She did received the information in mail but would like to speak with you further. Patient aware that you are out of office

## 2019-04-29 ENCOUNTER — Telehealth: Payer: Self-pay | Admitting: Family Medicine

## 2019-04-29 ENCOUNTER — Other Ambulatory Visit: Payer: Self-pay | Admitting: Family Medicine

## 2019-04-29 NOTE — Telephone Encounter (Signed)
Spoke to patient concerning moderate bilateral lower extremity pain with activity. Recent Doppler Ultrasound is negative for DVT. Advised her to continue Atorvastatin, ASA, and increase her physical activity. We will follow up with her in 1 month. She will contact office if pain does not improve or worsens. Verbalized understanding.

## 2019-05-15 ENCOUNTER — Other Ambulatory Visit: Payer: Self-pay | Admitting: Infectious Diseases

## 2019-05-15 DIAGNOSIS — B20 Human immunodeficiency virus [HIV] disease: Secondary | ICD-10-CM

## 2019-05-18 ENCOUNTER — Other Ambulatory Visit: Payer: Self-pay | Admitting: Cardiovascular Disease

## 2019-05-19 NOTE — Telephone Encounter (Signed)
Rx(s) sent to pharmacy electronically.  

## 2019-05-23 ENCOUNTER — Telehealth: Payer: Self-pay

## 2019-05-23 NOTE — Telephone Encounter (Signed)
Patient states that she has been having a lot of fatigue and she is still having the leg pain. Please advise

## 2019-05-24 ENCOUNTER — Other Ambulatory Visit: Payer: Self-pay | Admitting: Family Medicine

## 2019-05-24 DIAGNOSIS — G8929 Other chronic pain: Secondary | ICD-10-CM

## 2019-05-25 NOTE — Telephone Encounter (Signed)
Patient notified

## 2019-06-21 ENCOUNTER — Ambulatory Visit: Payer: Medicare HMO | Admitting: Family Medicine

## 2019-06-22 ENCOUNTER — Other Ambulatory Visit: Payer: Self-pay | Admitting: Infectious Diseases

## 2019-06-22 DIAGNOSIS — Z1231 Encounter for screening mammogram for malignant neoplasm of breast: Secondary | ICD-10-CM

## 2019-07-06 ENCOUNTER — Other Ambulatory Visit: Payer: Self-pay

## 2019-07-06 DIAGNOSIS — M79606 Pain in leg, unspecified: Secondary | ICD-10-CM

## 2019-07-07 ENCOUNTER — Other Ambulatory Visit: Payer: Self-pay

## 2019-07-07 ENCOUNTER — Ambulatory Visit (HOSPITAL_COMMUNITY)
Admission: RE | Admit: 2019-07-07 | Discharge: 2019-07-07 | Disposition: A | Discharge status: Home / Self Care | Payer: Medicare HMO | Source: Ambulatory Visit | Attending: Family | Admitting: Family

## 2019-07-07 ENCOUNTER — Ambulatory Visit (INDEPENDENT_AMBULATORY_CARE_PROVIDER_SITE_OTHER): Payer: Medicare HMO | Admitting: Family

## 2019-07-07 ENCOUNTER — Encounter: Payer: Self-pay | Admitting: Family

## 2019-07-07 VITALS — BP 113/77 | HR 79 | Temp 97.3°F | Resp 16 | Ht 62.5 in | Wt 154.0 lb

## 2019-07-07 DIAGNOSIS — M25552 Pain in left hip: Secondary | ICD-10-CM

## 2019-07-07 DIAGNOSIS — M79604 Pain in right leg: Secondary | ICD-10-CM

## 2019-07-07 DIAGNOSIS — M79606 Pain in leg, unspecified: Secondary | ICD-10-CM | POA: Diagnosis present

## 2019-07-07 NOTE — Progress Notes (Signed)
Referred by:  Azzie Glatter, [address] [place],  [place] [id number]  Reason for referral: right leg and left hip pain for a few months   History of Present Illness  Cathy Smith is a 53 y.o. (12-29-1965) female who presents with chief complaint: "right leg bothering me for a few months".  This seems to remain stable to her and is intermittent. Walking improves leg pain slightly. Getting up and sitting down into a chair make the right leg pain worse. She denies any known injury.  She states that a topical for arthritis helps her right leg pain.  She also c/o left hip pain when supine, denies pain in her left hip at any other time. She denies any know diagnosis of arthritis.   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   The patient has had no history of DVT, denies history of pregnancy, denies history of varicose vein, denies history of venous stasis ulcers, denies history of  Lymphedema and denies history of skin changes in lower legs.  There is denies family history of venous disorders.  The patient has not used compression stockings in the past.  She does not have DM and she has never used tobacco. She takes a daily 325 mg ASA, a statin, and a beta blocker.   Past Medical History:  Diagnosis Date  . Coronary artery disease   . History of condyloma acuminatum   . History of uterine fibroid   . History of vaginal dysplasia    VAIN III--  oncologist-  dr Denman George  . History of vulvar dysplasia    VIN III   gyn-oncologist-  dr Denman George  . HIV infection ([place]) [facility]-  dr hatcher  . Hypertension   . Hypertriglyceridemia without hypercholesterolemia   . PONV (postoperative nausea and vomiting)   . VIN III (vulvar intraepithelial neoplasia III)   . Wears glasses     Past Surgical History:  Procedure Laterality Date  . ABDOMINAL HYSTERECTOMY  1995 approx  . ANTERIOR CERVICAL DECOMP/DISCECTOMY FUSION  11-30-2009   C4 -- C6  . CO2 LASER  APPLICATION Bilateral 9/32/3557   Procedure: BILATERAL CO2 LASER ABLATION OF THE VULVA;  Surgeon: Everitt Amber, MD;  Location: [facility];  Service: Gynecology;  Laterality: Bilateral;  . CO2 LASER APPLICATION N/A 01/28/253   Procedure: CO2 LASER OF THE VULVAR;  Surgeon: Everitt Amber, MD;  Location: [facility];  Service: Gynecology;  Laterality: N/A;  . CO2 LASER APPLICATION N/A 2/70/6237   Procedure: CO2 LASER VAPORIZATION OF THE VULVA;  Surgeon: Everitt Amber, MD;  Location: [facility];  Service: Gynecology;  Laterality: N/A;  . CO2 LASER APPLICATION N/A 05/06/3150   Procedure: CO2 LASER APPLICATION OF VULVA;  Surgeon: Everitt Amber, MD;  Location: [facility];  Service: Gynecology;  Laterality: N/A;  . COLONOSCOPY  01-28-2015  . CORONARY ARTERY BYPASS GRAFT N/A 11/25/2017   Procedure: CORONARY ARTERY BYPASS GRAFTING (CABG), ON PUMP, TIMES THREE, USING LEFT INTERNAL MAMMARY ARTERY AND ENDOSCOPICALLY HARVESTED LEFT GREATER SAPHENOUS VEIN;  Surgeon: Rexene Alberts, MD;  Location: [facility];  Service: Open Heart Surgery;  Laterality: N/A;  . I & D LEFT BUTTOCK ABSCESS  04-28-2001  . LEFT HEART CATH AND CORONARY ANGIOGRAPHY N/A 11/22/2017   Procedure: LEFT HEART CATH AND CORONARY ANGIOGRAPHY;  Surgeon: Lorretta Harp, MD;  Location: [facility];  Service: Cardiovascular;  Laterality: N/A;  .  TEE WITHOUT CARDIOVERSION N/A 11/25/2017   Procedure: TRANSESOPHAGEAL ECHOCARDIOGRAM (TEE);  Surgeon: Purcell Nails, MD;  Location: [facility];  Service: Open Heart Surgery;  Laterality: N/A;  . VULVECTOMY Right 05/06/2015   Procedure: WIDE LOCAL  EXCISION  OF RIGHT VULVA;  Surgeon: Adolphus Birchwood, MD;  Location: [facility];  Service: Gynecology;  Laterality: Right;  Marland Kitchen VULVECTOMY N/A 05/04/2017   Procedure: WIDE EXCISION VULVECTOMY;  Surgeon: Adolphus Birchwood, MD;  Location: [facility];  Service: Gynecology;  Laterality: N/A;  .  VULVECTOMY N/A 12/27/2018   Procedure: WIDE EXCISION VULVECTOMY;  Surgeon: Adolphus Birchwood, MD;  Location: [facility];  Service: Gynecology;  Laterality: N/A;    Social History   Socioeconomic History  . Marital status: Married    Spouse name: Not on file  . Number of children: Not on file  . Years of education: Not on file  . Highest education level: Not on file  Occupational History  . Not on file  Social Needs  . Financial resource strain: Not on file  . Food insecurity    Worry: Not on file    Inability: Not on file  . Transportation needs    Medical: Not on file    Non-medical: Not on file  Tobacco Use  . Smoking status: Never Smoker  . Smokeless tobacco: Never Used  Substance and Sexual Activity  . Alcohol use: No    Alcohol/week: 0.0 standard drinks  . Drug use: No  . Sexual activity: Yes    Partners: Male    Birth control/protection: Condom    Comment: pt. given condoms  Lifestyle  . Physical activity    Days per week: Not on file    Minutes per session: Not on file  . Stress: Not on file  Relationships  . Social Musician on phone: Not on file    Gets together: Not on file    Attends religious service: Not on file    Active member of club or organization: Not on file    Attends meetings of clubs or organizations: Not on file    Relationship status: Not on file  . Intimate partner violence    Fear of current or ex partner: Not on file    Emotionally abused: Not on file    Physically abused: Not on file    Forced sexual activity: Not on file  Other Topics Concern  . Not on file  Social History Narrative  . Not on file    Family History  Problem Relation Age of Onset  . Cataracts Mother   . Hypertension Father   . CAD Paternal Grandfather   . Colon cancer Neg Hx     Current Outpatient Medications on File Prior to Visit  Medication Sig Dispense Refill  . acetaminophen (TYLENOL) 325 MG tablet Take 650 mg by mouth every 6  (six) hours as needed.    Marland Kitchen aspirin EC 325 MG EC tablet Take 1 tablet (325 mg total) by mouth daily. 30 tablet 0  . atorvastatin (LIPITOR) 20 MG tablet TAKE 1 TABLET(20 MG) BY MOUTH DAILY AT 6 PM 90 tablet 3  . cyclobenzaprine (FLEXERIL) 10 MG tablet     . furosemide (LASIX) 40 MG tablet TAKE 1 TABLET(40 MG) BY MOUTH DAILY 90 tablet 3  . GENVOYA 150-150-200-10 MG TABS tablet TAKE 1 TABLET BY MOUTH DAILY WITH BREAKFAST 30 tablet 1  . metoprolol tartrate (LOPRESSOR) 25 MG tablet Take 1.5  tablets (37.5 mg total) by mouth 2 (two) times daily. 270 tablet 2  . PREZISTA 800 MG tablet TAKE 1 TABLET(800 MG) BY MOUTH DAILY 30 tablet 1   No current facility-administered medications on file prior to visit.     No Known Allergies  REVIEW OF SYSTEMS: Cardiovascular: No chest pain, chest pressure, palpitations, orthopnea, or dyspnea on exertion. No claudication or rest pain,  No history of DVT or phlebitis. Pulmonary: No productive cough, asthma or wheezing. Neurologic: No weakness, paresthesias, aphasia, or amaurosis. No dizziness. Hematologic: No bleeding problems or clotting disorders. Musculoskeletal: No joint pain or joint swelling. Gastrointestinal: No blood in stool or hematemesis Genitourinary: No dysuria or hematuria. Psychiatric:: No history of major depression. Integumentary: No rashes or ulcers. She does have a remote hx of eczema at her right antecubital area  Constitutional: No fever or chills.  Physical Examination Vitals:   07/07/19 1403  BP: 113/77  Pulse: 79  Resp: 16  Temp: (!) 97.3 F (36.3 C)  TempSrc: Temporal  SpO2: 97%  Weight: 154 lb (69.9 kg)  Height: 5' 2.5" (1.588 m)   Body mass index is 27.72 kg/m.  PHYSICAL EXAMINATION: General: The patient appears their stated age.   HEENT:  No gross abnormalities Pulmonary: Respirations are non-labored Abdomen: Soft and non-tender with. Musculoskeletal: There are no major deformities.   Neurologic: No focal weakness or  paresthesias are detected, Skin: There are no ulcer or rashes noted. Psychiatric: The patient has normal affect. Cardiovascular: There is a regular rate and rhythm without significant murmur appreciated.   Vascular: Vessel Right Left  Radial 2+Palpable 2+Palpable  Carotid Not Palpable, without bruit Not Palpable, without bruit  Aorta Not palpable N/A  Femoral 2+Palpable 2+Palpable  Popliteal Not palpable Not palpable  PT 2+Palpable notPalpable  DP 2+Palpable 1+Palpable     Non-Invasive Vascular Imaging  BLE Venous Duplex (Date: 07/07/2019):  Venous Reflux Times Normal value < 0.5 sec +---+----------+---------+    Right (ms)Left (ms) +---+----------+---------+ CFV917.00              +---+----------+---------+ +------------------------------+----------+---------+ VEIN DIAMETERS:               Right (cm)Left (cm) +------------------------------+----------+---------+ GSV at Saphenofemoral junction0.69      0.54      +------------------------------+----------+---------+ GSV at prox thigh             0.40      0.20      +------------------------------+----------+---------+ GSV at mid thigh              0.32      0.17      +------------------------------+----------+---------+ GSV at distal thigh           0.29      0.11      +------------------------------+----------+---------+ GSV at knee                   0.24      0.15      +------------------------------+----------+---------+ GSV prox calf                 0.17      0.09      +------------------------------+----------+---------+ GSV mid calf                  0.21      0.18      +------------------------------+----------+---------+ SSV origin                    0.24  0.26      +------------------------------+----------+---------+ SSV prox                      0.22      0.14      +------------------------------+----------+---------+ SSV mid                       0.18       0.22      +------------------------------+----------+---------+   Summary: Right: No reflux was noted in the femoral vein in the thigh, and popliteal vein. Abnormal reflux times were noted in the common femoral vein. There is no evidence of superficial venous thrombosis. No cystic structure found in the popliteal fossa. No  evidence of deep vein thrombosis in the common femoral, femoral, or popliteal veins. Left: No reflux was noted in the common femoral vein , femoral vein in the thigh, and popliteal vein. There is no evidence of superficial venous thrombosis.There is no evidence of chronic venous insufficiency. No cystic structure found in the popliteal  fossa. No evidence of deep vein thrombosis in the common femoral, femoral, or popliteal veins.    Medical Decision Making  Altamese DillingRenee C Ramp is a 53 y.o. female who presents with: a year history of stable right leg and left hips pain.  No edema nor varicoties in her lower extremities.   Arthritis cream helped relieve her tight leg pain. She she does not seem to complain of knee or ankle pain, has no arthritic changes in her hands.    Based on the patient's history and examination, no evidence for DVT, mild venous reflux in right leg, but not the greater saphenous vein, no venous reflux in the left leg.   Bilateral pedal pulses are palpable, more than adequate arterial perfusion.  Consider orthopedic or lumbar spine etiology for right leg and left hip pain, defer to pt PCP.   Charisse MarchSuzanne , RN, MSN, FNP-C [facility] Office: [phone]  07/07/2019, 2:16 PM  Clinic MD: Randie Heinzain

## 2019-07-12 ENCOUNTER — Telehealth: Payer: Self-pay | Admitting: Family Medicine

## 2019-07-19 NOTE — Telephone Encounter (Signed)
done

## 2019-07-21 ENCOUNTER — Other Ambulatory Visit: Payer: Self-pay | Admitting: Infectious Diseases

## 2019-07-21 DIAGNOSIS — B20 Human immunodeficiency virus [HIV] disease: Secondary | ICD-10-CM

## 2019-07-25 ENCOUNTER — Other Ambulatory Visit: Payer: Self-pay

## 2019-07-25 DIAGNOSIS — B20 Human immunodeficiency virus [HIV] disease: Secondary | ICD-10-CM

## 2019-07-25 MED ORDER — GENVOYA 150-150-200-10 MG PO TABS: 1.0000 | ORAL_TABLET | Freq: Every day | ORAL | 1 refills | Status: DC

## 2019-07-25 MED ORDER — DARUNAVIR ETHANOLATE 800 MG PO TABS: ORAL_TABLET | ORAL | 1 refills | Status: DC

## 2019-07-28 ENCOUNTER — Other Ambulatory Visit: Payer: Self-pay | Admitting: *Deleted

## 2019-07-28 DIAGNOSIS — B20 Human immunodeficiency virus [HIV] disease: Secondary | ICD-10-CM

## 2019-07-28 DIAGNOSIS — Z113 Encounter for screening for infections with a predominantly sexual mode of transmission: Secondary | ICD-10-CM

## 2019-07-31 ENCOUNTER — Other Ambulatory Visit: Payer: Medicare HMO

## 2019-08-01 ENCOUNTER — Other Ambulatory Visit: Payer: Medicare HMO

## 2019-08-01 ENCOUNTER — Ambulatory Visit: Payer: Medicare HMO | Admitting: Family Medicine

## 2019-08-01 ENCOUNTER — Other Ambulatory Visit: Payer: Self-pay | Admitting: Infectious Diseases

## 2019-08-01 ENCOUNTER — Other Ambulatory Visit: Payer: Self-pay

## 2019-08-01 ENCOUNTER — Other Ambulatory Visit (HOSPITAL_COMMUNITY)
Admission: RE | Admit: 2019-08-01 | Discharge: 2019-08-01 | Disposition: A | Discharge status: Home / Self Care | Payer: Medicare HMO | Source: Ambulatory Visit | Attending: Infectious Diseases | Admitting: Infectious Diseases

## 2019-08-01 DIAGNOSIS — Z113 Encounter for screening for infections with a predominantly sexual mode of transmission: Secondary | ICD-10-CM

## 2019-08-01 DIAGNOSIS — B20 Human immunodeficiency virus [HIV] disease: Secondary | ICD-10-CM

## 2019-08-02 LAB — URINE CYTOLOGY ANCILLARY ONLY
Chlamydia: NEGATIVE
Neisseria Gonorrhea: NEGATIVE

## 2019-08-03 ENCOUNTER — Other Ambulatory Visit: Payer: Medicare HMO

## 2019-08-03 LAB — HELPER T-LYMPH-CD4 (ARMC ONLY)
% CD 4 Pos. Lymph.: 21 % — ABNORMAL LOW (ref 30.8–58.5)
Absolute CD 4 Helper: 336 /uL — ABNORMAL LOW (ref 359–1519)
Basophils Absolute: 0 10*3/uL (ref 0.0–0.2)
Basos: 1 %
EOS (ABSOLUTE): 0.1 10*3/uL (ref 0.0–0.4)
Eos: 2 %
Hematocrit: 39.1 % (ref 34.0–46.6)
Hemoglobin: 12.9 g/dL (ref 11.1–15.9)
Immature Grans (Abs): 0 10*3/uL (ref 0.0–0.1)
Immature Granulocytes: 0 %
Lymphocytes Absolute: 1.6 10*3/uL (ref 0.7–3.1)
Lymphs: 44 %
MCH: 33 pg (ref 26.6–33.0)
MCHC: 33 g/dL (ref 31.5–35.7)
MCV: 100 fL — ABNORMAL HIGH (ref 79–97)
Monocytes Absolute: 0.5 10*3/uL (ref 0.1–0.9)
Monocytes: 14 %
Neutrophils Absolute: 1.4 10*3/uL (ref 1.4–7.0)
Neutrophils: 39 %
Platelets: 169 10*3/uL (ref 150–450)
RBC: 3.91 x10E6/uL (ref 3.77–5.28)
RDW: 11.5 % — ABNORMAL LOW (ref 11.7–15.4)
WBC: 3.6 10*3/uL (ref 3.4–10.8)

## 2019-08-04 LAB — CBC WITH DIFFERENTIAL/PLATELET
Absolute Monocytes: 503 cells/uL (ref 200–950)
Basophils Absolute: 41 cells/uL (ref 0–200)
Basophils Relative: 1.2 %
Eosinophils Absolute: 51 cells/uL (ref 15–500)
Eosinophils Relative: 1.5 %
HCT: 37.6 % (ref 35.0–45.0)
Hemoglobin: 12.9 g/dL (ref 11.7–15.5)
Lymphs Abs: 1550 cells/uL (ref 850–3900)
MCH: 33.2 pg — ABNORMAL HIGH (ref 27.0–33.0)
MCHC: 34.3 g/dL (ref 32.0–36.0)
MCV: 96.9 fL (ref 80.0–100.0)
MPV: 11.7 fL (ref 7.5–12.5)
Monocytes Relative: 14.8 %
Neutro Abs: 1255 cells/uL — ABNORMAL LOW (ref 1500–7800)
Neutrophils Relative %: 36.9 %
Platelets: 172 10*3/uL (ref 140–400)
RBC: 3.88 10*6/uL (ref 3.80–5.10)
RDW: 11.6 % (ref 11.0–15.0)
Total Lymphocyte: 45.6 %
WBC: 3.4 10*3/uL — ABNORMAL LOW (ref 3.8–10.8)

## 2019-08-04 LAB — COMPLETE METABOLIC PANEL WITH GFR
AG Ratio: 1.5 (calc) (ref 1.0–2.5)
ALT: 45 U/L — ABNORMAL HIGH (ref 6–29)
AST: 31 U/L (ref 10–35)
Albumin: 4.4 g/dL (ref 3.6–5.1)
Alkaline phosphatase (APISO): 70 U/L (ref 37–153)
BUN: 12 mg/dL (ref 7–25)
CO2: 27 mmol/L (ref 20–32)
Calcium: 9.6 mg/dL (ref 8.6–10.4)
Chloride: 104 mmol/L (ref 98–110)
Creat: 0.94 mg/dL (ref 0.50–1.05)
GFR, Est African American: 79 mL/min/{1.73_m2} (ref >=60)
GFR, Est Non African American: 68 mL/min/{1.73_m2} (ref >=60)
Globulin: 2.9 g/dL (calc) (ref 1.9–3.7)
Glucose, Bld: 126 mg/dL — ABNORMAL HIGH (ref 65–99)
Potassium: 4 mmol/L (ref 3.5–5.3)
Sodium: 142 mmol/L (ref 135–146)
Total Bilirubin: 0.7 mg/dL (ref 0.2–1.2)
Total Protein: 7.3 g/dL (ref 6.1–8.1)

## 2019-08-04 LAB — HIV-1 RNA QUANT-NO REFLEX-BLD
HIV 1 RNA Quant: <20 copies/mL
HIV-1 RNA Quant, Log: <1.3 Log copies/mL

## 2019-08-04 LAB — RPR: RPR Ser Ql: NONREACTIVE

## 2019-08-09 ENCOUNTER — Ambulatory Visit: Payer: Medicare HMO

## 2019-08-16 ENCOUNTER — Ambulatory Visit (INDEPENDENT_AMBULATORY_CARE_PROVIDER_SITE_OTHER): Payer: Medicare HMO | Admitting: Family Medicine

## 2019-08-16 ENCOUNTER — Encounter: Payer: Self-pay | Admitting: Family Medicine

## 2019-08-16 ENCOUNTER — Other Ambulatory Visit: Payer: Self-pay

## 2019-08-16 VITALS — BP 124/77 | HR 76 | Temp 98.0°F | Ht 62.0 in | Wt 161.4 lb

## 2019-08-16 DIAGNOSIS — B20 Human immunodeficiency virus [HIV] disease: Secondary | ICD-10-CM

## 2019-08-16 DIAGNOSIS — Z09 Encounter for follow-up examination after completed treatment for conditions other than malignant neoplasm: Secondary | ICD-10-CM | POA: Diagnosis not present

## 2019-08-16 DIAGNOSIS — M79606 Pain in leg, unspecified: Secondary | ICD-10-CM | POA: Diagnosis not present

## 2019-08-16 DIAGNOSIS — G629 Polyneuropathy, unspecified: Secondary | ICD-10-CM

## 2019-08-16 DIAGNOSIS — I10 Essential (primary) hypertension: Secondary | ICD-10-CM | POA: Diagnosis not present

## 2019-08-16 DIAGNOSIS — R5383 Other fatigue: Secondary | ICD-10-CM | POA: Diagnosis not present

## 2019-08-16 DIAGNOSIS — G8929 Other chronic pain: Secondary | ICD-10-CM

## 2019-08-16 DIAGNOSIS — E781 Pure hyperglyceridemia: Secondary | ICD-10-CM | POA: Diagnosis not present

## 2019-08-16 DIAGNOSIS — Z23 Encounter for immunization: Secondary | ICD-10-CM | POA: Diagnosis not present

## 2019-08-16 LAB — POCT URINALYSIS DIPSTICK
Blood, UA: NEGATIVE
Glucose, UA: NEGATIVE
Ketones, UA: NEGATIVE
Leukocytes, UA: NEGATIVE
Nitrite, UA: NEGATIVE
Protein, UA: POSITIVE — AB
Spec Grav, UA: >=1.03 — AB (ref 1.010–1.025)
Urobilinogen, UA: 1 E.U./dL
pH, UA: 5.5 (ref 5.0–8.0)

## 2019-08-16 MED ORDER — GABAPENTIN 100 MG PO CAPS: ORAL_CAPSULE | ORAL | 3 refills | Status: DC

## 2019-08-16 NOTE — Progress Notes (Signed)
[facility]   Established Patient Office Visit  Subjective:  Patient ID: Cathy DillingRenee C Woody, female    DOB: 06/20/1966  Age: 53 y.o. MRN: [id number]  CC:  Chief Complaint  Patient presents with  . Follow-up    6 mth follow up    HPI Cathy Smith is a 53 year old female who presents for Follow Up today.   Past Medical History:  Diagnosis Date  . Coronary artery disease   . History of condyloma acuminatum   . History of uterine fibroid   . History of vaginal dysplasia    VAIN III--  oncologist-  dr Andrey Farmerrossi  . History of vulvar dysplasia    VIN III   gyn-oncologist-  dr Andrey Farmerrossi  . HIV infection ([facility]) [facility]-  dr hatcher  . Hypertension   . Hypertriglyceridemia without hypercholesterolemia   . PONV (postoperative nausea and vomiting)   . VIN III (vulvar intraepithelial neoplasia III)   . Wears glasses    Current Status: Since her last office visit, she continues to have chronic pain in her legs, which originate in her hips. She exercises by walking daily. She was referred to Vascular Surgery for evaluation and was negative for any abnormalities. She denies visual changes, chest pain, cough, shortness of breath, heart palpitations, and falls. She has occasional headaches and dizziness with position changes. Denies severe headaches, confusion, seizures, double vision, and blurred vision, nausea and vomiting. She continues to follow up with Infection Disease as needed. She denies fevers, chills, fatigue, recent infections, weight loss, and night sweats. No reports of GI problems such as diarrhea, and constipation. She has no reports of blood in stools, dysuria and hematuria. No depression or anxiety reported.   Past Surgical History:  Procedure Laterality Date  . ABDOMINAL HYSTERECTOMY  1995 approx  . ANTERIOR CERVICAL DECOMP/DISCECTOMY FUSION  11-30-2009   C4 -- C6  . CO2 LASER APPLICATION Bilateral 07/23/2015    Procedure: BILATERAL CO2 LASER ABLATION OF THE VULVA;  Surgeon: Adolphus BirchwoodEmma Rossi, MD;  [facility] [place];  Service: Gynecology;  Laterality: Bilateral;  . CO2 LASER APPLICATION N/A 02/25/2016   Procedure: CO2 LASER OF THE VULVAR;  Surgeon: Adolphus BirchwoodEmma Rossi, MD;  [facility] [place];  Service: Gynecology;  Laterality: N/A;  . CO2 LASER APPLICATION N/A 05/04/2017   Procedure: CO2 LASER VAPORIZATION OF THE VULVA;  Surgeon: Adolphus Birchwoodossi, Emma, MD;  [facility] [place];  Service: Gynecology;  Laterality: N/A;  . CO2 LASER APPLICATION N/A 12/27/2018   Procedure: CO2 LASER APPLICATION OF VULVA;  Surgeon: Adolphus Birchwoodossi, Emma, MD;  [facility] [place];  Service: Gynecology;  Laterality: N/A;  . COLONOSCOPY  01-28-2015  . CORONARY ARTERY BYPASS GRAFT N/A 11/25/2017   Procedure: CORONARY ARTERY BYPASS GRAFTING (CABG), ON PUMP, TIMES THREE, USING LEFT INTERNAL MAMMARY ARTERY AND ENDOSCOPICALLY HARVESTED LEFT GREATER SAPHENOUS VEIN;  Surgeon: Purcell Nailswen, Clarence H, MD;  [facility];  Service: Open Heart Surgery;  Laterality: N/A;  . I & D LEFT BUTTOCK ABSCESS  04-28-2001  . LEFT HEART CATH AND CORONARY ANGIOGRAPHY N/A 11/22/2017   Procedure: LEFT HEART CATH AND CORONARY ANGIOGRAPHY;  Surgeon: Runell GessBerry, Jonathan J, MD;  Location: [facility];  Service: Cardiovascular;  Laterality: N/A;  . TEE WITHOUT CARDIOVERSION N/A 11/25/2017   Procedure: TRANSESOPHAGEAL ECHOCARDIOGRAM (TEE);  Surgeon: Purcell Nailswen, Clarence H, MD;  [facility];  Service: Open Heart Surgery;  Laterality: N/A;  .  VULVECTOMY Right 05/06/2015   Procedure: WIDE LOCAL  EXCISION  OF RIGHT VULVA;  Surgeon: Adolphus Birchwood, MD;  [facility] [place];  Service: Gynecology;  Laterality: Right;  Marland Kitchen VULVECTOMY N/A 05/04/2017   Procedure: WIDE EXCISION VULVECTOMY;  Surgeon: Adolphus Birchwood, MD;  Location[facility];  Service: Gynecology;  Laterality: N/A;  . VULVECTOMY N/A 12/27/2018   Procedure:  WIDE EXCISION VULVECTOMY;  Surgeon: Adolphus Birchwood, MD;  Location: [facility];  Service: Gynecology;  Laterality: N/A;    Family History  Problem Relation Age of Onset  . Cataracts Mother   . Hypertension Father   . CAD Paternal Grandfather   . Colon cancer Neg Hx     Social History   Socioeconomic History  . Marital status: Married    Spouse name: Not on file  . Number of children: Not on file  . Years of education: Not on file  . Highest education level: Not on file  Occupational History  . Not on file  Social Needs  . Financial resource strain: Not on file  . Food insecurity    Worry: Not on file    Inability: Not on file  . Transportation needs    Medical: Not on file    Non-medical: Not on file  Tobacco Use  . Smoking status: Never Smoker  . Smokeless tobacco: Never Used  Substance and Sexual Activity  . Alcohol use: No    Alcohol/week: 0.0 standard drinks  . Drug use: No  . Sexual activity: Yes    Partners: Male    Birth control/protection: Condom    Comment: pt. given condoms  Lifestyle  . Physical activity    Days per week: Not on file    Minutes per session: Not on file  . Stress: Not on file  Relationships  . Social Musician on phone: Not on file    Gets together: Not on file    Attends religious service: Not on file    Active member of club or organization: Not on file    Attends meetings of clubs or organizations: Not on file    Relationship status: Not on file  . Intimate partner violence    Fear of current or ex partner: Not on file    Emotionally abused: Not on file    Physically abused: Not on file    Forced sexual activity: Not on file  Other Topics Concern  . Not on file  Social History Narrative  . Not on file    Outpatient Medications Prior to Visit  Medication Sig Dispense Refill  . acetaminophen (TYLENOL) 325 MG tablet Take 650 mg by mouth every 6 (six) hours as needed.    Marland Kitchen aspirin EC 325 MG EC tablet  Take 1 tablet (325 mg total) by mouth daily. 30 tablet 0  . cyclobenzaprine (FLEXERIL) 10 MG tablet     . darunavir (PREZISTA) 800 MG tablet TAKE 1 TABLET(800 MG) BY MOUTH DAILY 30 tablet 1  . elvitegravir-cobicistat-emtricitabine-tenofovir (GENVOYA) 150-150-200-10 MG TABS tablet Take 1 tablet by mouth daily with breakfast. 30 tablet 1  . furosemide (LASIX) 40 MG tablet TAKE 1 TABLET(40 MG) BY MOUTH DAILY 90 tablet 3  . metoprolol tartrate (LOPRESSOR) 25 MG tablet Take 1.5 tablets (37.5 mg total) by mouth 2 (two) times daily. 270 tablet 2  . atorvastatin (LIPITOR) 20 MG tablet TAKE 1 TABLET(20 MG) BY MOUTH DAILY AT 6 PM 90 tablet 3   No  facility-administered medications prior to visit.     No Known Allergies  ROS Review of Systems  Constitutional: Negative.   HENT: Negative.   Eyes: Negative.   Respiratory: Positive for shortness of breath.   Cardiovascular: Negative.   Gastrointestinal: Negative.   Endocrine: Negative.   Genitourinary: Negative.   Musculoskeletal: Positive for arthralgias (generalized chronic pain leg pain).  Skin: Negative.   Allergic/Immunologic: Negative.   Neurological: Positive for dizziness and headaches.  Hematological: Negative.   Psychiatric/Behavioral: Negative.       Objective:    Physical Exam  Constitutional: She is oriented to person, place, and time. She appears well-developed and well-nourished.  HENT:  Head: Normocephalic and atraumatic.  Right Ear: External ear normal.  Left Ear: External ear normal.  Eyes: Conjunctivae are normal.  Neck: Normal range of motion. Neck supple.  Cardiovascular: Normal rate, regular rhythm, normal heart sounds and intact distal pulses.  Pulmonary/Chest: Effort normal and breath sounds normal.  Abdominal: Soft. Bowel sounds are normal.  Musculoskeletal: Normal range of motion.  Neurological: She is alert and oriented to person, place, and time. She has normal reflexes.  Skin: Skin is warm and dry.   Psychiatric: She has a normal mood and affect. Her behavior is normal. Judgment and thought content normal.  Nursing note and vitals reviewed.   BP 124/77 (BP Location: Right Arm, Patient Position: Sitting, Cuff Size: Normal)   Pulse 76   Temp 98 F (36.7 C) (Oral)   Ht 5\' 2"  (1.575 m)   Wt 161 lb 6.4 oz (73.2 kg)   SpO2 97%   BMI 29.52 kg/m  Wt Readings from Last 3 Encounters:  08/16/19 161 lb 6.4 oz (73.2 kg)  07/07/19 154 lb (69.9 kg)  03/01/19 148 lb (67.1 kg)     There are no preventive care reminders to display for this patient.  There are no preventive care reminders to display for this patient.  Lab Results  Component Value Date   TSH 1.050 08/16/2019   Lab Results  Component Value Date   WBC 3.4 (L) 08/01/2019   HGB 12.9 08/01/2019   HCT 37.6 08/01/2019   MCV 96.9 08/01/2019   PLT 172 08/01/2019   Lab Results  Component Value Date   NA 142 08/01/2019   K 4.0 08/01/2019   CO2 27 08/01/2019   GLUCOSE 126 (H) 08/01/2019   BUN 12 08/01/2019   CREATININE 0.94 08/01/2019   BILITOT 0.7 08/01/2019   ALKPHOS 87 12/08/2017   AST 31 08/01/2019   ALT 45 (H) 08/01/2019   PROT 7.3 08/01/2019   ALBUMIN 4.6 12/08/2017   CALCIUM 9.6 08/01/2019   ANIONGAP 8 12/27/2018   Lab Results  Component Value Date   CHOL 143 08/16/2019   Lab Results  Component Value Date   HDL 44 08/16/2019   Lab Results  Component Value Date   LDLCALC 71 08/16/2019   Lab Results  Component Value Date   TRIG 162 (H) 08/16/2019   Lab Results  Component Value Date   CHOLHDL 3.3 08/16/2019   Lab Results  Component Value Date   HGBA1C 5.7 (A) 08/15/2018      Assessment & Plan:   1. Essential hypertension The current medical regimen is effective; blood pressue is stable at 124/77 today; continue present plan and medications as prescribed. She will continue to take medications as prescribed, to decrease high sodium intake, excessive alcohol intake, increase potassium intake,  smoking cessation, and increase physical activity of at least 30  minutes of cardio activity daily. She will continue to follow Heart Healthy or DASH diet. - POCT urinalysis dipstick - TSH - Lipid Panel - Vitamin B12 - Vitamin D, 25-hydroxy  2. Neuropathy We will initiate Gabapentin today.  - gabapentin (NEURONTIN) 100 MG capsule; Take 2 capsules (Total = 200 mg), by mouth, 2 times daily.  Dispense: 120 capsule; Refill: 3  3. Human immunodeficiency virus (HIV) disease ([place]) Continue to follow up with Infection Disease as needed.   4. Chronic pain of lower extremity, unspecified laterality  5. Need for immunization against influenza  6. Follow up She will follow up in 6 months.   Meds ordered this encounter  Medications  . gabapentin (NEURONTIN) 100 MG capsule    Sig: Take 2 capsules (Total = 200 mg), by mouth, 2 times daily.    Dispense:  120 capsule    Refill:  3    Orders Placed This Encounter  Procedures  . Flu Vaccine QUAD 6+ mos PF IM (Fluarix Quad PF)  . TSH  . Lipid Panel  . Vitamin B12  . Vitamin D, 25-hydroxy  . POCT urinalysis dipstick    Referral Orders  No referral(s) requested today   Kathe Becton,  MSN, FNP-BC [address], [place] [address] [phone] fax   Problem List Items Addressed This Visit      Cardiovascular and Mediastinum   Essential hypertension - Primary   Relevant Orders   POCT urinalysis dipstick (Completed)   TSH (Completed)   Lipid Panel (Completed)   Vitamin B12 (Completed)   Vitamin D, 25-hydroxy (Completed)     Other   Human immunodeficiency virus (HIV) disease ([place])    Other Visit Diagnoses    Neuropathy       Relevant Medications   gabapentin (NEURONTIN) 100 MG capsule   Chronic pain of lower extremity, unspecified laterality       Relevant Medications   gabapentin (NEURONTIN) 100 MG capsule   Need for  immunization against influenza       Follow up          Meds ordered this encounter  Medications  . gabapentin (NEURONTIN) 100 MG capsule    Sig: Take 2 capsules (Total = 200 mg), by mouth, 2 times daily.    Dispense:  120 capsule    Refill:  3    Follow-up: Return in about 6 months (around 02/14/2020).    Azzie Glatter, FNP

## 2019-08-16 NOTE — Patient Instructions (Signed)
Neuropathic Pain °Neuropathic pain is pain caused by damage to the nerves that are responsible for certain sensations in your body (sensory nerves). The pain can be caused by: °· Damage to the sensory nerves that send signals to your spinal cord and brain (peripheral nervous system). °· Damage to the sensory nerves in your brain or spinal cord (central nervous system). °Neuropathic pain can make you more sensitive to pain. Even a minor sensation can feel very painful. This is usually a long-term condition that can be difficult to treat. The type of pain differs from person to person. It may: °· Start suddenly (acute), or it may develop slowly and last for a long time (chronic). °· Come and go as damaged nerves heal, or it may stay at the same level for years. °· Cause emotional distress, loss of sleep, and a lower quality of life. °What are the causes? °The most common cause of this condition is diabetes. Many other diseases and conditions can also cause neuropathic pain. Causes of neuropathic pain can be classified as: °· Toxic. This is caused by medicines and chemicals. The most common cause of toxic neuropathic pain is damage from cancer treatments (chemotherapy). °· Metabolic. This can be caused by: °? Diabetes. This is the most common disease that damages the nerves. °? Lack of vitamin B from long-term alcohol abuse. °· Traumatic. Any injury that cuts, crushes, or stretches a nerve can cause damage and pain. A common example is feeling pain after losing an arm or leg (phantom limb pain). °· Compression-related. If a sensory nerve gets trapped or compressed for a long period of time, the blood supply to the nerve can be cut off. °· Vascular. Many blood vessel diseases can cause neuropathic pain by decreasing blood supply and oxygen to nerves. °· Autoimmune. This type of pain results from diseases in which the body's defense system (immune system) mistakenly attacks sensory nerves. Examples of autoimmune diseases  that can cause neuropathic pain include lupus and multiple sclerosis. °· Infectious. Many types of viral infections can damage sensory nerves and cause pain. Shingles infection is a common cause of this type of pain. °· Inherited. Neuropathic pain can be a symptom of many diseases that are passed down through families (genetic). °What increases the risk? °You are more likely to develop this condition if: °· You have diabetes. °· You smoke. °· You drink too much alcohol. °· You are taking certain medicines, including medicines that kill cancer cells (chemotherapy) or that treat immune system disorders. °What are the signs or symptoms? °The main symptom is pain. Neuropathic pain is often described as: °· Burning. °· Shock-like. °· Stinging. °· Hot or cold. °· Itching. °How is this diagnosed? °No single test can diagnose neuropathic pain. It is diagnosed based on: °· Physical exam and your symptoms. Your health care provider will ask you about your pain. You may be asked to use a pain scale to describe how bad your pain is. °· Tests. These may be done to see if you have a high sensitivity to pain and to help find the cause and location of any sensory nerve damage. They include: °? Nerve conduction studies to test how well nerve signals travel through your sensory nerves (electrodiagnostic testing). °? Stimulating your sensory nerves through electrodes on your skin and measuring the response in your spinal cord and brain (somatosensory evoked potential). °· Imaging studies, such as: °? X-rays. °? CT scan. °? MRI. °How is this treated? °Treatment for neuropathic pain may change   over time. You may need to try different treatment options or a combination of treatments. Some options include: °· Treating the underlying cause of the neuropathy, such as diabetes, kidney disease, or vitamin deficiencies. °· Stopping medicines that can cause neuropathy, such as chemotherapy. °· Medicine to relieve pain. Medicines may  include: °? Prescription or over-the-counter pain medicine. °? Anti-seizure medicine. °? Antidepressant medicines. °? Pain-relieving patches that are applied to painful areas of skin. °? A medicine to numb the area (local anesthetic), which can be injected as a nerve block. °· Transcutaneous nerve stimulation. This uses electrical currents to block painful nerve signals. The treatment is painless. °· Alternative treatments, such as: °? Acupuncture. °? Meditation. °? Massage. °? Physical therapy. °? Pain management programs. °? Counseling. °Follow these instructions at home: °Medicines ° °· Take over-the-counter and prescription medicines only as told by your health care provider. °· Do not drive or use heavy machinery while taking prescription pain medicine. °· If you are taking prescription pain medicine, take actions to prevent or treat constipation. Your health care provider may recommend that you: °? Drink enough fluid to keep your urine pale yellow. °? Eat foods that are high in fiber, such as fresh fruits and vegetables, whole grains, and beans. °? Limit foods that are high in fat and processed sugars, such as fried or sweet foods. °? Take an over-the-counter or prescription medicine for constipation. °Lifestyle ° °· Have a good support system at home. °· Consider joining a chronic pain support group. °· Do not use any products that contain nicotine or tobacco, such as cigarettes and e-cigarettes. If you need help quitting, ask your health care provider. °· Do not drink alcohol. °General instructions °· Learn as much as you can about your condition. °· Work closely with all your health care providers to find the treatment plan that works best for you. °· Ask your health care provider what activities are safe for you. °· Keep all follow-up visits as told by your health care provider. This is important. °Contact a health care provider if: °· Your pain treatments are not working. °· You are having side effects  from your medicines. °· You are struggling with tiredness (fatigue), mood changes, depression, or anxiety. °Summary °· Neuropathic pain is pain caused by damage to the nerves that are responsible for certain sensations in your body (sensory nerves). °· Neuropathic pain may come and go as damaged nerves heal, or it may stay at the same level for years. °· Neuropathic pain is usually a long-term condition that can be difficult to treat. Consider joining a chronic pain support group. °This information is not intended to replace advice given to you by your health care provider. Make sure you discuss any questions you have with your health care provider. °Document Released: 07/23/2004 Document Revised: 02/16/2019 Document Reviewed: 11/12/2017 °Elsevier Patient Education © 2020 [employer] ° ° °Gabapentin capsules or tablets °What is this medicine? °GABAPENTIN (GA ba pen tin) is used to control seizures in certain types of epilepsy. It is also used to treat certain types of nerve pain. °This medicine may be used for other purposes; ask your health care provider or pharmacist if you have questions. °COMMON BRAND NAME(S): Active-PAC with Gabapentin, Gabarone, Neurontin °What should I tell my health care provider before I take this medicine? °They need to know if you have any of these conditions: °· history of drug abuse or alcohol abuse problem °· kidney disease °· lung or breathing disease °· suicidal thoughts,   plans, or attempt; a previous suicide attempt by you or a family member °· an unusual or allergic reaction to gabapentin, other medicines, foods, dyes, or preservatives °· pregnant or trying to get pregnant °· breast-feeding °How should I use this medicine? °Take this medicine by mouth with a glass of water. Follow the directions on the prescription label. You can take it with or without food. If it upsets your stomach, take it with food. Take your medicine at regular intervals. Do not take it more often than  directed. Do not stop taking except on your doctor's advice. °If you are directed to break the 600 or 800 mg tablets in half as part of your dose, the extra half tablet should be used for the next dose. If you have not used the extra half tablet within 28 days, it should be thrown away. °A special MedGuide will be given to you by the pharmacist with each prescription and refill. Be sure to read this information carefully each time. °Talk to your pediatrician regarding the use of this medicine in children. While this drug may be prescribed for children as young as 3 years for selected conditions, precautions do apply. °Overdosage: If you think you have taken too much of this medicine contact a poison control center or emergency room at once. °NOTE: This medicine is only for you. Do not share this medicine with others. °What if I miss a dose? °If you miss a dose, take it as soon as you can. If it is almost time for your next dose, take only that dose. Do not take double or extra doses. °What may interact with this medicine? °This medicine may interact with the following medications: °· alcohol °· antihistamines for allergy, cough, and cold °· certain medicines for anxiety or sleep °· certain medicines for depression like amitriptyline, fluoxetine, sertraline °· certain medicines for seizures like phenobarbital, primidone °· certain medicines for stomach problems °· general anesthetics like halothane, isoflurane, methoxyflurane, propofol °· local anesthetics like lidocaine, pramoxine, tetracaine °· medicines that relax muscles for surgery °· narcotic medicines for pain °· phenothiazines like chlorpromazine, mesoridazine, prochlorperazine, thioridazine °This list may not describe all possible interactions. Give your health care provider a list of all the medicines, herbs, non-prescription drugs, or dietary supplements you use. Also tell them if you smoke, drink alcohol, or use illegal drugs. Some items may interact with  your medicine. °What should I watch for while using this medicine? °Visit your doctor or health care provider for regular checks on your progress. You may want to keep a record at home of how you feel your condition is responding to treatment. You may want to share this information with your doctor or health care provider at each visit. You should contact your doctor or health care provider if your seizures get worse or if you have any new types of seizures. Do not stop taking this medicine or any of your seizure medicines unless instructed by your doctor or health care provider. Stopping your medicine suddenly can increase your seizures or their severity. °This medicine may cause serious skin reactions. They can happen weeks to months after starting the medicine. Contact your health care provider right away if you notice fevers or flu-like symptoms with a rash. The rash may be red or purple and then turn into blisters or peeling of the skin. Or, you might notice a red rash with swelling of the face, lips or lymph nodes in your neck or under your arms. °Wear a   medical identification bracelet or chain if you are taking this medicine for seizures, and carry a card that lists all your medications. °You may get drowsy, dizzy, or have blurred vision. Do not drive, use machinery, or do anything that needs mental alertness until you know how this medicine affects you. To reduce dizzy or fainting spells, do not sit or stand up quickly, especially if you are an older patient. Alcohol can increase drowsiness and dizziness. Avoid alcoholic drinks. °Your mouth may get dry. Chewing sugarless gum or sucking hard candy, and drinking plenty of water will help. °The use of this medicine may increase the chance of suicidal thoughts or actions. Pay special attention to how you are responding while on this medicine. Any worsening of mood, or thoughts of suicide or dying should be reported to your health care provider right away. °Women  who become pregnant while using this medicine may enroll in the [place] Antiepileptic Drug Pregnancy Registry by calling 1-888-233-2334. This registry collects information about the safety of antiepileptic drug use during pregnancy. °What side effects may I notice from receiving this medicine? °Side effects that you should report to your doctor or health care professional as soon as possible: °· allergic reactions like skin rash, itching or hives, swelling of the face, lips, or tongue °· breathing problems °· rash, fever, and swollen lymph nodes °· redness, blistering, peeling or loosening of the skin, including inside the mouth °· suicidal thoughts, mood changes °Side effects that usually do not require medical attention (report to your doctor or health care professional if they continue or are bothersome): °· dizziness °· drowsiness °· headache °· nausea, vomiting °· swelling of ankles, feet, hands °· tiredness °This list may not describe all possible side effects. Call your doctor for medical advice about side effects. You may report side effects to FDA at 1-800-FDA-1088. °Where should I keep my medicine? °Keep out of reach of children. °This medicine may cause accidental overdose and death if it taken by other adults, children, or pets. Mix any unused medicine with a substance like cat litter or coffee grounds. Then throw the medicine away in a sealed container like a sealed bag or a coffee can with a lid. Do not use the medicine after the expiration date. °Store at room temperature between 15 and 30 degrees C (59 and 86 degrees F). °NOTE: This sheet is a summary. It may not cover all possible information. If you have questions about this medicine, talk to your doctor, pharmacist, or health care provider. °© 2020 [employer]vier/[employer] (2019-01-27 14:16:43) ° °

## 2019-08-17 ENCOUNTER — Other Ambulatory Visit: Payer: Self-pay | Admitting: Family Medicine

## 2019-08-17 ENCOUNTER — Telehealth: Payer: Self-pay | Admitting: Cardiovascular Disease

## 2019-08-17 DIAGNOSIS — G629 Polyneuropathy, unspecified: Secondary | ICD-10-CM | POA: Insufficient documentation

## 2019-08-17 LAB — LIPID PANEL
Chol/HDL Ratio: 3.3 ratio (ref 0.0–4.4)
Cholesterol, Total: 143 mg/dL (ref 100–199)
HDL: 44 mg/dL (ref >39)
LDL Chol Calc (NIH): 71 mg/dL (ref 0–99)
Triglycerides: 162 mg/dL — ABNORMAL HIGH (ref 0–149)
VLDL Cholesterol Cal: 28 mg/dL (ref 5–40)

## 2019-08-17 LAB — TSH: TSH: 1.05 u[IU]/mL (ref 0.450–4.500)

## 2019-08-17 LAB — VITAMIN D 25 HYDROXY (VIT D DEFICIENCY, FRACTURES): Vit D, 25-Hydroxy: 27.4 ng/mL — ABNORMAL LOW (ref 30.0–100.0)

## 2019-08-17 LAB — VITAMIN B12: Vitamin B-12: 581 pg/mL (ref 232–1245)

## 2019-08-17 NOTE — Telephone Encounter (Signed)
Called patient, she was questioning her lab work- regarding her LIPID, she was discussing a heart healthy diet. We discussed foods and drinks that would be good for her to do.  Patient verbalized understanding, had no other questions.

## 2019-08-17 NOTE — Telephone Encounter (Signed)
New Message:   Pt called and said she had a question, pt would not say what it was. She just wanted to talk to the nurse.

## 2019-08-18 ENCOUNTER — Telehealth: Payer: Self-pay

## 2019-08-18 NOTE — Telephone Encounter (Signed)
COVID-19 Pre-Screening Questions:08/18/19   Do you currently have a fever (>100 F), chills or unexplained body aches?NO  Are you currently experiencing new cough, shortness of breath, sore throat, runny nose? NO  .  Have you recently travelled outside the state of [place] in the last 14 days? NO .  Have you been in contact with someone that is currently pending confirmation of Covid19 testing or has been confirmed to have the Covid19 virus? NO  **If the patient answers NO to ALL questions -  advise the patient to please call the clinic before coming to the office should any symptoms develop.     

## 2019-08-21 ENCOUNTER — Encounter: Payer: Self-pay | Admitting: Infectious Diseases

## 2019-08-21 ENCOUNTER — Ambulatory Visit (INDEPENDENT_AMBULATORY_CARE_PROVIDER_SITE_OTHER): Payer: Medicare HMO | Admitting: Infectious Diseases

## 2019-08-21 ENCOUNTER — Other Ambulatory Visit: Payer: Self-pay

## 2019-08-21 VITALS — BP 150/92 | HR 87 | Temp 98.0°F

## 2019-08-21 DIAGNOSIS — R87622 Low grade squamous intraepithelial lesion on cytologic smear of vagina (LGSIL): Secondary | ICD-10-CM

## 2019-08-21 DIAGNOSIS — B20 Human immunodeficiency virus [HIV] disease: Secondary | ICD-10-CM

## 2019-08-21 MED ORDER — DARUNAVIR ETHANOLATE 800 MG PO TABS: ORAL_TABLET | ORAL | 5 refills | Status: DC

## 2019-08-21 MED ORDER — GENVOYA 150-150-200-10 MG PO TABS: 1.0000 | ORAL_TABLET | Freq: Every day | ORAL | 5 refills | Status: DC

## 2019-08-21 NOTE — Patient Instructions (Addendum)
Nice to meet you!   No medication changes - please continue taking your Genvoya and Prezista once a day with food.   For your neck discomfort try BioFreeze Gel from [facility]. This is a nice cooling effect   Also would look into a rice heating pad (can make your own with a big clean sock and rice or if you sew can find patterns online).   Please call Dr Serita Grit office at [phone] to schedule a follow up appointment for a pelvic exam.   Please call the [facility] to set up your mammogram. I think you missed your appointment in September 30th of this year.   [address], [address] [place], [place] [address]   Please come back to see Colletta [place] in 6 months with labs prior to your visit.

## 2019-08-21 NOTE — Progress Notes (Signed)
Name: Cathy Smith  DOB: Jun 16, 1966 MRN: [id number] PCP: Azzie Glatter, FNP    Patient Active Problem List   Diagnosis Date Noted  . Neuropathy 08/17/2019  . Suspected deep vein thrombosis (DVT) 04/09/2019  . AIN grade III 01/18/2019  . Left flank pain 12/14/2018  . Left leg pain 12/14/2018  . 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 LGSIL Pap smear of vagina 04/28/2017  . 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 Sinusitis 12/30/2015  . History of fusion of cervical spine 08/27/2015  . 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 Physical exam, annual 09/06/2014  . Alopecia of scalp 09/06/2014  . Hypertriglyceridemia without hypercholesterolemia 08/08/2014  . Dysuria 07/13/2012  . Essential hypertension 06/12/2009  . MONILIASIS, ORAL 09/24/2008  . HX, PERSONAL, PAST NONCOMPLIANCE 11/24/2006  . Human immunodeficiency virus (HIV) disease ([place]) 09/15/2000  . HYSTERECTOMY, HX OF 11/10/1983     Brief Narrative:  Cathy Smith  is a 53 y.o. female with well controlled HIV disease, Dx 2005 per chart review. CD4 nadir 60 VL 12,300  HIV Risk: HIV+ husband, heterosexual History of OIs: vulvar cancer  Intake Labs 2014: Hep B sAg (not done), sAb (-), cAb (not done); Hep A (not done), Hep C (not done) Quantiferon () HLA B*5701 (-) G6PD: ()   Previous Regimens: Jorje Guild + Prezista   Genotypes: . 2008 - P14S, V35I, S68G, R83K, V106I,  [id number], [id number], [id number], T200I, L214F, E297K, D324E;  V3I, T4S, S37N, L63P  . 2009 - M184V  . 2010 - V106I  Cumulative Genotype:  RTI Resistance Mutations: M184V NNRTI Resistance Mutations: V106I  Nucleoside Reverse Transcriptase Inhibitors abacavir (ABC) Low-Level Resistance zidovudine (AZT) Susceptible emtricitabine (FTC) High-Level Resistance lamivudine (3TC) High-Level Resistance tenofovir (TDF) Susceptible  Non-nucleoside Reverse Transcriptase Inhibitors doravirine (DOR) Potential Low-Level Resistance efavirenz (EFV) Susceptible  etravirine (ETR) Potential Low-Level Resistance nevirapine (NVP) Potential Low-Level Resistance rilpivirine (RPV) Potential Low-Level Resistance  PI Major Resistance Mutations: None PI Accessory Resistance Mutations: None  Protease Inhibitors atazanavir/r (ATV/r) Susceptible darunavir/r (DRV/r) Susceptible lopinavir/r (LPV/r) Susceptible  Subjective:   Chief Complaint  Patient presents with  . Follow-up    B20     HPI: [name] is here for routine follow up care for HIV. She has had long-standing well controlled HIV on Genvoya + Prezista with last VL < 20 and CD4 338 cells in September 2020. She has no trouble with her medications and takes them every day with food as directed. She is married; her husband is HIV+ and in care with Dr. Megan Salon on ARVs.   She has a history of CABG x 3 in 11-2017. Has been having trouble with some leg pain recently. Has been using Epsom salt baths to help. Felt like pins and needles up and down her front/back calves. Started on Gabapentin from her PCP and reports that this has helped. She does not smoke or drink alcohol.   Mammogram appointment was missed in 07-2019 and needs to reschedule. She was previously following with Dr. Denman George with GYN oncology for VIN/CIS, s/p laser tx in 2017.  In March 2020 she underwent another vaginal inspection and cytology with LSIL and persistent HPV infection. She does not know when she has follow up for this next.   Eating and sleeping well. No concerns with depressed/anxious mood. She had a mechanical fall recently and had no injury aside from her pride.   Review of Systems  Constitutional: Negative for chills, diaphoresis, fever, malaise/fatigue and weight loss.  HENT: Negative for sore throat.   Cardiovascular: Negative for chest pain and leg swelling.  Gastrointestinal: Negative for abdominal pain, diarrhea, nausea and vomiting.  Genitourinary: Negative for dysuria.  Musculoskeletal: Positive for falls.       Leg  pain as described above  Neurological: Positive for tingling (as described above). Negative for weakness.  Psychiatric/Behavioral: Negative for depression and substance abuse. The patient is not nervous/anxious and does not have insomnia.     Past Medical History:  Diagnosis Date  . Coronary artery disease   . History of condyloma acuminatum   . History of uterine fibroid   . History of vaginal dysplasia    VAIN III--  oncologist-  dr Denman George  . History of vulvar dysplasia    VIN III   gyn-oncologist-  dr Denman George  . HIV infection ([place]) [facility]-  dr hatcher  . Hypertension   . Hypertriglyceridemia without hypercholesterolemia   . PONV (postoperative nausea and vomiting)   . VIN III (vulvar intraepithelial neoplasia III)   . Wears glasses     Outpatient Medications Prior to Visit  Medication Sig Dispense Refill  . acetaminophen (TYLENOL) 325 MG tablet Take 650 mg by mouth every 6 (six) hours as needed.    Marland Kitchen aspirin EC 325 MG EC tablet Take 1 tablet (325 mg total) by mouth daily. 30 tablet 0  . cyclobenzaprine (FLEXERIL) 10 MG tablet     . furosemide (LASIX) 40 MG tablet TAKE 1 TABLET(40 MG) BY MOUTH DAILY 90 tablet 3  . gabapentin (NEURONTIN) 100 MG capsule Take 2 capsules (Total = 200 mg), by mouth, 2 times daily. 120 capsule 3  . metoprolol tartrate (LOPRESSOR) 25 MG tablet Take 1.5 tablets (37.5 mg total) by mouth 2 (two) times daily. 270 tablet 2  . darunavir (PREZISTA) 800 MG tablet TAKE 1 TABLET(800 MG) BY MOUTH DAILY 30 tablet 1  . elvitegravir-cobicistat-emtricitabine-tenofovir (GENVOYA) 150-150-200-10 MG TABS tablet Take 1 tablet by mouth daily with breakfast. 30 tablet 1   No facility-administered medications prior to visit.      No Known Allergies  Social History   Tobacco Use  . Smoking status: Never Smoker  . Smokeless tobacco: Never Used  Substance Use Topics  . Alcohol use: No    Alcohol/week: 0.0 standard drinks  . Drug use: No     Family History  Problem Relation Age of Onset  . Cataracts Mother   . Hypertension Father   . CAD Paternal Grandfather   . Colon cancer Neg Hx     Social History   Substance and Sexual Activity  Sexual Activity Yes  . Partners: Male  . Birth control/protection: Condom   Comment: pt. given condoms     Objective:   Vitals:   08/21/19 1014  BP: (!) 150/92  Pulse: 87  Temp: 98 F (36.7 C)   There is no height or weight on file to calculate BMI.  Physical Exam  Lab Results Lab Results  Component Value Date   WBC 3.4 (L) 08/01/2019   HGB 12.9 08/01/2019   HCT 37.6 08/01/2019   MCV 96.9 08/01/2019   PLT 172 08/01/2019    Lab Results  Component Value Date   CREATININE 0.94 08/01/2019   BUN 12 08/01/2019   NA 142 08/01/2019   K 4.0 08/01/2019   CL 104 08/01/2019   CO2 27 08/01/2019    Lab Results  Component Value Date   ALT 45 (H) 08/01/2019   AST 31  08/01/2019   ALKPHOS 87 12/08/2017   BILITOT 0.7 08/01/2019    Lab Results  Component Value Date   CHOL 143 08/16/2019   HDL 44 08/16/2019   LDLCALC 71 08/16/2019   TRIG 162 (H) 08/16/2019   CHOLHDL 3.3 08/16/2019   HIV 1 RNA Quant (copies/mL)  Date Value  08/01/2019 <20 NOT DETECTED  05/09/2018 <20 NOT DETECTED  01/19/2018 <20 NOT DETECTED   CD4 T Cell Abs (/uL)  Date Value  05/09/2018 300 (L)  01/27/2017 280 (L)  08/05/2016 280 (L)     Assessment & Plan:   Problem List Items Addressed This Visit      Unprioritized   Human immunodeficiency virus (HIV) disease ([place]) - Primary (Chronic)    She has well controlled HIV with only slightly low CD4 count. She has several significant mutations to NNRTI/NRTI class as outlined above. Will continue her [place]. We discussed current and historical labs to help her understand and interpret. Helped her load MyChart onto her phone so she can keep in close communication and see results.  Vaccines are up to date.  I gave her the number to breast  center to schedule mammogram. Not sure if she has had a colonoscopy for screening purposes yet. She will follow up with her PCP for this.   She can return to clinic in 6 months with labs prior to. I have added on a quantiferon, [id number] and complete hepatitis serology for her.       Relevant Medications   elvitegravir-cobicistat-emtricitabine-tenofovir (GENVOYA) 150-150-200-10 MG TABS tablet   darunavir (PREZISTA) 800 MG tablet   Other Relevant Orders   HIV-1 RNA quant-no reflex-bld   T-helper cell (CD4)- ([facility] clinic only)   HLA B*5701   QuantiFERON-TB Gold Plus   Hepatitis B surface antigen   Hepatitis B Core Antibody, total   Hepatitis C antibody   Hepatitis B surface antibody,qualitative   LGSIL Pap smear of vagina    I gave her the phone number to Dr. Serita Grit office and asked her to schedule an appointment as she is overdue. Given her HIV status and history of abnormal results I recommended lifelong screenings/monitoring.          Janene Madeira, MSN, NP-C [facility] [place] Pager: [phone] Office: [phone]  08/21/19  1:33 PM

## 2019-08-21 NOTE — Assessment & Plan Note (Signed)
I gave her the phone number to Dr. Serita Grit office and asked her to schedule an appointment as she is overdue. Given her HIV status and history of abnormal results I recommended lifelong screenings/monitoring.

## 2019-08-21 NOTE — Assessment & Plan Note (Signed)
She has well controlled HIV with only slightly low CD4 count. She has several significant mutations to NNRTI/NRTI class as outlined above. Will continue her [employer]. We discussed current and historical labs to help her understand and interpret. Helped her load MyChart onto her phone so she can keep in close communication and see results.  Vaccines are up to date.  I gave her the number to breast center to schedule mammogram. Not sure if she has had a colonoscopy for screening purposes yet. She will follow up with her PCP for this.   She can return to clinic in 6 months with labs prior to. I have added on a quantiferon, [id number] and complete hepatitis serology for her.

## 2019-08-28 ENCOUNTER — Ambulatory Visit: Payer: Medicare HMO | Admitting: Gynecologic Oncology

## 2019-10-03 ENCOUNTER — Inpatient Hospital Stay: Payer: Medicare HMO | Admitting: Gynecologic Oncology

## 2019-10-04 ENCOUNTER — Ambulatory Visit
Admission: RE | Admit: 2019-10-04 | Discharge: 2019-10-04 | Disposition: A | Discharge status: Home / Self Care | Payer: Medicare HMO | Source: Ambulatory Visit | Attending: Infectious Diseases | Admitting: Infectious Diseases

## 2019-10-04 ENCOUNTER — Other Ambulatory Visit: Payer: Self-pay | Admitting: Family Medicine

## 2019-10-04 ENCOUNTER — Other Ambulatory Visit: Payer: Self-pay

## 2019-10-04 DIAGNOSIS — Z1231 Encounter for screening mammogram for malignant neoplasm of breast: Secondary | ICD-10-CM | POA: Diagnosis not present

## 2019-10-17 ENCOUNTER — Telehealth: Payer: Self-pay | Admitting: Family Medicine

## 2019-10-17 NOTE — Telephone Encounter (Signed)
Since her last office visit, she continues to have chronic pain in her legs, which originate in her hips. She

## 2019-10-18 ENCOUNTER — Other Ambulatory Visit: Payer: Self-pay | Admitting: Family Medicine

## 2019-10-18 ENCOUNTER — Other Ambulatory Visit: Payer: Self-pay | Admitting: *Deleted

## 2019-10-18 ENCOUNTER — Telehealth: Payer: Self-pay | Admitting: *Deleted

## 2019-10-18 DIAGNOSIS — M79606 Pain in leg, unspecified: Secondary | ICD-10-CM

## 2019-10-18 DIAGNOSIS — G8929 Other chronic pain: Secondary | ICD-10-CM

## 2019-10-18 DIAGNOSIS — M79604 Pain in right leg: Secondary | ICD-10-CM

## 2019-10-18 NOTE — Telephone Encounter (Signed)
Very difficult to assess exactly what is going on with this patient based on her responces. C/o increasing aching leg pain back of both leg that radiates to hip. A referral was sent over from Dr. Georgina Snell office. Reviewed notes with patient from last visit with NP. She denies any change in temp, color or sensation of extremity. Made it clear to her that we are checking blood flow and pain could be another condition as mentioned at last appt. She requested appt. I told her she would get a call from scheduling for ABI's and NP appt.

## 2019-11-02 ENCOUNTER — Telehealth (HOSPITAL_COMMUNITY): Payer: Self-pay | Admitting: *Deleted

## 2019-11-02 NOTE — Telephone Encounter (Signed)

## 2019-11-06 ENCOUNTER — Ambulatory Visit (HOSPITAL_COMMUNITY)
Admission: RE | Admit: 2019-11-06 | Discharge: 2019-11-06 | Disposition: A | Discharge status: Home / Self Care | Payer: Medicare HMO | Source: Ambulatory Visit | Attending: Family | Admitting: Family

## 2019-11-06 ENCOUNTER — Ambulatory Visit (INDEPENDENT_AMBULATORY_CARE_PROVIDER_SITE_OTHER): Payer: Medicare HMO | Admitting: Physician Assistant

## 2019-11-06 ENCOUNTER — Other Ambulatory Visit: Payer: Self-pay

## 2019-11-06 VITALS — BP 109/75 | HR 72 | Temp 97.9°F | Resp 20 | Ht 62.0 in | Wt 158.0 lb

## 2019-11-06 DIAGNOSIS — M79605 Pain in left leg: Secondary | ICD-10-CM | POA: Diagnosis not present

## 2019-11-06 DIAGNOSIS — M79604 Pain in right leg: Secondary | ICD-10-CM | POA: Diagnosis not present

## 2019-11-06 DIAGNOSIS — M25552 Pain in left hip: Secondary | ICD-10-CM

## 2019-11-06 NOTE — Progress Notes (Signed)
HISTORY AND PHYSICAL     CC:  follow up. Requesting Provider:  Kallie Locks, FNP  HPI: This is a 53 y.o. female who is here today for follow up.  She was seen back in August for right leg symptoms.  At that time, it had been going on for several months.  Walking improved it slightly and getting up and down from the chair made it worse.  She did not know of any injuries.  She uses a topical for arthritis in the right leg.  She also had c/o left hip pain when laying flat but no other time.    The pt returns today for c/o of left leg and hip pain.  She states that her right leg does not hurt.  She states she has some pain behind her knee when she lays down.  This does not hurt with walking.  She states that she gets a cramp like sensation in her left hip and swings her hips side to side and this resolves.  She does not have any non healing wounds on her feet.    She has hx of CABG x 3 in 2019.  She did have GSV removed from both legs for CABG.   She has hx of HIV and is followed by infectious disease.    The pt is on a statin for cholesterol management.    The pt is on an aspirin.    Other AC:  none The pt is on BB for hypertension.  The pt does not have diabetes. Tobacco hx:  never   Past Medical History:  Diagnosis Date  . Coronary artery disease   . History of condyloma acuminatum   . History of uterine fibroid   . History of vaginal dysplasia    VAIN III--  oncologist-  dr Andrey Farmer  . History of vulvar dysplasia    VIN III   gyn-oncologist-  dr Andrey Farmer  . HIV infection ([facility]) [facility]-  dr hatcher  . Hypertension   . Hypertriglyceridemia without hypercholesterolemia   . PONV (postoperative nausea and vomiting)   . VIN III (vulvar intraepithelial neoplasia III)   . Wears glasses     Past Surgical History:  Procedure Laterality Date  . ABDOMINAL HYSTERECTOMY  1995 approx  . ANTERIOR CERVICAL DECOMP/DISCECTOMY FUSION  11-30-2009   C4 -- C6  . CO2  LASER APPLICATION Bilateral 07/23/2015   Procedure: BILATERAL CO2 LASER ABLATION OF THE VULVA;  Surgeon: Adolphus Birchwood, MD;  Location: [facility] [place];  Service: Gynecology;  Laterality: Bilateral;  . CO2 LASER APPLICATION N/A 02/25/2016   Procedure: CO2 LASER OF THE VULVAR;  Surgeon: Adolphus Birchwood, MD;  Location: [facility] [place];  Service: Gynecology;  Laterality: N/A;  . CO2 LASER APPLICATION N/A 05/04/2017   Procedure: CO2 LASER VAPORIZATION OF THE VULVA;  Surgeon: Adolphus Birchwood, MD;  Location: [facility] [place];  Service: Gynecology;  Laterality: N/A;  . CO2 LASER APPLICATION N/A 12/27/2018   Procedure: CO2 LASER APPLICATION OF VULVA;  Surgeon: Adolphus Birchwood, MD;  Location: [facility] [place];  Service: Gynecology;  Laterality: N/A;  . COLONOSCOPY  01-28-2015  . CORONARY ARTERY BYPASS GRAFT N/A 11/25/2017   Procedure: CORONARY ARTERY BYPASS GRAFTING (CABG), ON PUMP, TIMES THREE, USING LEFT INTERNAL MAMMARY ARTERY AND ENDOSCOPICALLY HARVESTED LEFT GREATER SAPHENOUS VEIN;  Surgeon: Purcell Nails, MD;  Location: [facility] OR;  Service: Open Heart Surgery;  Laterality: N/A;  . I & D LEFT BUTTOCK  ABSCESS  04-28-2001  . LEFT HEART CATH AND CORONARY ANGIOGRAPHY N/A 11/22/2017   Procedure: LEFT HEART CATH AND CORONARY ANGIOGRAPHY;  Surgeon: Lorretta Harp, MD;  Location: [place] [facility];  Service: Cardiovascular;  Laterality: N/A;  . TEE WITHOUT CARDIOVERSION N/A 11/25/2017   Procedure: TRANSESOPHAGEAL ECHOCARDIOGRAM (TEE);  Surgeon: Rexene Alberts, MD;  Location: [place];  Service: Open Heart Surgery;  Laterality: N/A;  . VULVECTOMY Right 05/06/2015   Procedure: WIDE LOCAL  EXCISION  OF RIGHT VULVA;  Surgeon: Everitt Amber, MD;  Location: [place];  Service: Gynecology;  Laterality: Right;  Marland Kitchen VULVECTOMY N/A 05/04/2017   Procedure: WIDE EXCISION VULVECTOMY;  Surgeon: Everitt Amber, MD;  Location: [facility];  Service: Gynecology;  Laterality: N/A;  .  VULVECTOMY N/A 12/27/2018   Procedure: WIDE EXCISION VULVECTOMY;  Surgeon: Everitt Amber, MD;  Location: [facility];  Service: Gynecology;  Laterality: N/A;    No Known Allergies  Current Outpatient Medications  Medication Sig Dispense Refill  . acetaminophen (TYLENOL) 325 MG tablet Take 650 mg by mouth every 6 (six) hours as needed.    Marland Kitchen aspirin EC 325 MG EC tablet Take 1 tablet (325 mg total) by mouth daily. 30 tablet 0  . cyclobenzaprine (FLEXERIL) 10 MG tablet     . darunavir (PREZISTA) 800 MG tablet TAKE 1 TABLET(800 MG) BY MOUTH DAILY 30 tablet 5  . elvitegravir-cobicistat-emtricitabine-tenofovir (GENVOYA) 150-150-200-10 MG TABS tablet Take 1 tablet by mouth daily with breakfast. 30 tablet 5  . furosemide (LASIX) 40 MG tablet TAKE 1 TABLET(40 MG) BY MOUTH DAILY 90 tablet 3  . gabapentin (NEURONTIN) 100 MG capsule Take 2 capsules (Total = 200 mg), by mouth, 2 times daily. 120 capsule 3  . metoprolol tartrate (LOPRESSOR) 25 MG tablet Take 1.5 tablets (37.5 mg total) by mouth 2 (two) times daily. 270 tablet 2   No current facility-administered medications for this visit.    Family History  Problem Relation Age of Onset  . Cataracts Mother   . Hypertension Father   . CAD Paternal Grandfather   . Colon cancer Neg Hx     Social History   Socioeconomic History  . Marital status: Married    Spouse name: Not on file  . Number of children: Not on file  . Years of education: Not on file  . Highest education level: Not on file  Occupational History  . Not on file  Tobacco Use  . Smoking status: Never Smoker  . Smokeless tobacco: Never Used  Substance and Sexual Activity  . Alcohol use: No    Alcohol/week: 0.0 standard drinks  . Drug use: No  . Sexual activity: Yes    Partners: Male    Birth control/protection: Condom    Comment: pt. given condoms  Other Topics Concern  . Not on file  Social History Narrative  . Not on file   Social Determinants of Health    Financial Resource Strain:   . Difficulty of Paying Living Expenses: Not on file  Food Insecurity:   . Worried About Charity fundraiser in the Last Year: Not on file  . Ran Out of Food in the Last Year: Not on file  Transportation Needs:   . Lack of Transportation (Medical): Not on file  . Lack of Transportation (Non-Medical): Not on file  Physical Activity:   . Days of Exercise per Week: Not on file  . Minutes of Exercise per Session: Not on file  Stress:   . Feeling of Stress : Not on file  Social Connections:   . Frequency of Communication with Friends and Family: Not on file  . Frequency of Social Gatherings with Friends and Family: Not on file  . Attends Religious Services: Not on file  . Active Member of Clubs or Organizations: Not on file  . Attends Banker Meetings: Not on file  . Marital Status: Not on file  Intimate Partner Violence:   . Fear of Current or Ex-Partner: Not on file  . Emotionally Abused: Not on file  . Physically Abused: Not on file  . Sexually Abused: Not on file     REVIEW OF SYSTEMS:   [X]  denotes positive finding, [ ]  denotes negative finding Cardiac  Comments:  Chest pain or chest pressure:    Shortness of breath upon exertion:    Short of breath when lying flat:    Irregular heart rhythm:        Vascular    Pain in calf, thigh, or hip brought on by ambulation:    Pain in feet at night that wakes you up from your sleep:     Blood clot in your veins:    Leg swelling:         Pulmonary    Oxygen at home:    Productive cough:     Wheezing:         Neurologic    Sudden weakness in arms or legs:     Sudden numbness in arms or legs:     Sudden onset of difficulty speaking or slurred speech:    Temporary loss of vision in one eye:     Problems with dizziness:         Gastrointestinal    Blood in stool:     Vomited blood:         Genitourinary    Burning when urinating:     Blood in urine:        Psychiatric     Major depression:         Hematologic    Bleeding problems:    Problems with blood clotting too easily:        Skin    Rashes or ulcers:        Constitutional    Fever or chills:      PHYSICAL EXAMINATION:  Today's Vitals   11/06/19 1527  BP: 109/75  Pulse: 72  Resp: 20  Temp: 97.9 F (36.6 C)  SpO2: 96%  Weight: 158 lb (71.7 kg)  Height: 5\' 2"  (1.575 m)   Body mass index is 28.9 kg/m.\  General:  WDWN in NAD; vital signs documented above Gait: Not observed HENT: WNL, normocephalic Pulmonary: normal non-labored breathing , without Rales, rhonchi,  wheezing Cardiac: regular HR, without  Murmurs; without carotid bruits Abdomen: soft, NT, no masses Skin: without rashes Vascular Exam/Pulses:  Right Left  Radial 2+ (normal) 2+ (normal)  Ulnar 2+ (normal) 2+ (normal)  Popliteal 2+ (normal) 1+ (weak)  DP 2+ (normal) 2+ (normal)  PT Unable to palpate  Unable to palpate    Extremities: without ischemic changes, without Gangrene , without cellulitis; without open wounds;  Musculoskeletal: no muscle wasting or atrophy  Neurologic: A&O X 3;  No focal weakness or paresthesias are detected Psychiatric:  The pt has Normal affect.   Non-Invasive Vascular Imaging:   ABI's/TBI's on 11/06/2019: Right:  1.20/1.03 Left:  1.16/0.98 Summary: Right: Resting right ankle-brachial index is  within normal range. No evidence of significant right lower extremity arterial disease. The right toe-brachial index is normal.  Left: Resting left ankle-brachial index is within normal range. No evidence of significant left lower extremity arterial disease. The left toe-brachial index is normal.  Venous reflux study on 07/07/2019: Venous Reflux Times Normal value < 0.5 sec +---+----------+---------+    Right (ms)Left (ms) +---+----------+---------+ [id number]              +---+----------+---------+  +------------------------------+----------+---------+ VEIN DIAMETERS:                Right (cm)Left (cm) +------------------------------+----------+---------+ GSV at Saphenofemoral junction0.69      0.54      +------------------------------+----------+---------+ GSV at prox thigh             0.40      0.20      +------------------------------+----------+---------+ GSV at mid thigh              0.32      0.17      +------------------------------+----------+---------+ GSV at distal thigh           0.29      0.11      +------------------------------+----------+---------+ GSV at knee                   0.24      0.15      +------------------------------+----------+---------+ GSV prox calf                 0.17      0.09      +------------------------------+----------+---------+ GSV mid calf                  0.21      0.18      +------------------------------+----------+---------+ SSV origin                    0.24      0.26      +------------------------------+----------+---------+ SSV prox                      0.22      0.14      +------------------------------+----------+---------+ SSV mid                       0.18      0.22      +------------------------------+----------+---------+  Summary: Right: No reflux was noted in the femoral vein in the thigh, and popliteal vein. Abnormal reflux times were noted in the common femoral vein. There is no evidence of superficial venous thrombosis. No cystic structure found in the popliteal fossa. No evidence of deep vein thrombosis in the common femoral, femoral, or popliteal veins.  Left: No reflux was noted in the common femoral vein , femoral vein in the thigh, and popliteal vein. There is no evidence of superficial venous thrombosis.There is no evidence of chronic venous insufficiency. No cystic structure found in the popliteal  fossa. No evidence of deep vein thrombosis in the common femoral, femoral, or popliteal veins.   Venous duplex (right) 04/12/2019: Summary: Right: There is no  evidence of deep vein thrombosis in the lower extremity. However, portions of this examination were limited- see technologist comments above. No cystic structure found in the popliteal fossa. Left: No evidence of common femoral vein obstruction.  ABI 11/23/2017: Right:  1.26 Left:  1.09  Carotid duplex 11/23/2017: Right Carotid: There is no evidence of stenosis in the right ICA.  Left Carotid: There is evidence in the left ICA of a 1-39% stenosis. Vertebrals:  Both vertebral arteries were patent with antegrade flow. Subclavians: Normal flow hemodynamics were seen in bilateral subclavian arteries.   ASSESSMENT/PLAN:: 53 y.o. female here for follow evaluation of LLE and left hip pain  -pt was evaluated in August for right leg and left hip pain and presents today with c/o left leg pain behind her knee.  This is not consistent with arterial or venous disease.  Her ABI's today are normal with minimal arterial disease.  She did have venous studies in August that also were normal.  There were no cystic structures in the popliteal fossa and I do not palpate any masses in either popliteal fossa.  -would recommend f/u with her PCP for further evaluation.     Doreatha Massed, PA-C [facility] [phone]  Clinic MD:   Darrick Penna (on call MD)

## 2019-11-07 ENCOUNTER — Telehealth: Payer: Self-pay | Admitting: Family Medicine

## 2019-11-08 NOTE — Telephone Encounter (Signed)
Message left for call back 

## 2019-11-09 ENCOUNTER — Telehealth: Payer: Self-pay

## 2019-11-09 NOTE — Telephone Encounter (Signed)
Patient has been seen by the Vascular Surgeon but continues  to have the hip & leg pain. She wants to know how she can get pain relief.   Please advise.

## 2019-11-20 ENCOUNTER — Inpatient Hospital Stay: Payer: Medicare HMO | Attending: Gynecologic Oncology | Admitting: Gynecologic Oncology

## 2019-11-30 ENCOUNTER — Ambulatory Visit: Payer: Medicare HMO

## 2019-11-30 ENCOUNTER — Other Ambulatory Visit: Payer: Self-pay

## 2019-12-01 ENCOUNTER — Encounter: Payer: Self-pay | Admitting: Family Medicine

## 2019-12-01 ENCOUNTER — Ambulatory Visit (INDEPENDENT_AMBULATORY_CARE_PROVIDER_SITE_OTHER): Payer: Medicare HMO | Admitting: Family Medicine

## 2019-12-01 VITALS — BP 114/72 | HR 84 | Temp 97.9°F | Ht 62.0 in | Wt 159.8 lb

## 2019-12-01 DIAGNOSIS — N390 Urinary tract infection, site not specified: Secondary | ICD-10-CM | POA: Diagnosis not present

## 2019-12-01 DIAGNOSIS — Z09 Encounter for follow-up examination after completed treatment for conditions other than malignant neoplasm: Secondary | ICD-10-CM

## 2019-12-01 DIAGNOSIS — G8929 Other chronic pain: Secondary | ICD-10-CM | POA: Diagnosis not present

## 2019-12-01 DIAGNOSIS — G629 Polyneuropathy, unspecified: Secondary | ICD-10-CM | POA: Diagnosis not present

## 2019-12-01 DIAGNOSIS — I10 Essential (primary) hypertension: Secondary | ICD-10-CM

## 2019-12-01 DIAGNOSIS — B20 Human immunodeficiency virus [HIV] disease: Secondary | ICD-10-CM

## 2019-12-01 DIAGNOSIS — M79606 Pain in leg, unspecified: Secondary | ICD-10-CM | POA: Diagnosis not present

## 2019-12-01 DIAGNOSIS — R7303 Prediabetes: Secondary | ICD-10-CM | POA: Diagnosis not present

## 2019-12-01 DIAGNOSIS — R8290 Unspecified abnormal findings in urine: Secondary | ICD-10-CM

## 2019-12-01 LAB — POCT URINALYSIS DIPSTICK
Blood, UA: NEGATIVE
Glucose, UA: NEGATIVE
Ketones, UA: NEGATIVE
Nitrite, UA: NEGATIVE
Protein, UA: POSITIVE — AB
Spec Grav, UA: >=1.03 — AB (ref 1.010–1.025)
Urobilinogen, UA: 0.2 E.U./dL
pH, UA: 5 (ref 5.0–8.0)

## 2019-12-01 LAB — POCT GLYCOSYLATED HEMOGLOBIN (HGB A1C): Hemoglobin A1C: 6.3 % — AB (ref 4.0–5.6)

## 2019-12-01 LAB — GLUCOSE, POCT (MANUAL RESULT ENTRY): POC Glucose: 121 mg/dL — AB (ref 70–99)

## 2019-12-01 MED ORDER — SULFAMETHOXAZOLE-TRIMETHOPRIM 800-160 MG PO TABS: 1.0000 | ORAL_TABLET | Freq: Two times a day (BID) | ORAL | 0 refills | Status: DC

## 2019-12-01 NOTE — Progress Notes (Signed)
[facility]   Established Patient Office Visit  Subjective:  Patient ID: Cathy Smith, female    DOB: 1966-04-17  Age: 54 y.o. MRN: [id number]  CC:  Chief Complaint  Patient presents with  . Follow-up    Left leg pain & HTN    HPI Cathy Smith is a 54 year old female who  presents for Follow Up today.   Past Medical History:  Diagnosis Date  . Coronary artery disease   . History of condyloma acuminatum   . History of uterine fibroid   . History of vaginal dysplasia    VAIN III--  oncologist-  dr Andrey Farmer  . History of vulvar dysplasia    VIN III   gyn-oncologist-  dr Andrey Farmer  . HIV infection (HCC) [facility]-  dr hatcher  . Hypertension   . Hypertriglyceridemia without hypercholesterolemia   . PONV (postoperative nausea and vomiting)   . VIN III (vulvar intraepithelial neoplasia III)   . Wears glasses    Current Status: Since her last office visit, she continues to have chronic bilateral leg pain. She is now using OTC pain cream, which has been effective with relieving leg pain at night, however her leg pain returns in the morning. She also has been stretching, walking, and exercising daily, and using water salts to aid in leg pain. She continues to follow up with Infection Disease as needed. She denies visual changes, chest pain, cough, shortness of breath, heart palpitations, and falls. She has occasional headaches and dizziness with position changes. Denies severe headaches, confusion, seizures, double vision, and blurred vision, nausea and vomiting. She denies fevers, chills, fatigue, recent infections, weight loss, and night sweats. No reports of GI problems such as diarrhea, and constipation. She has no reports of blood in stools, dysuria and hematuria. No depression or anxiety reported today. She denies suicidal ideations, homicidal ideations, or auditory hallucinations.   Past Surgical History:   Procedure Laterality Date  . ABDOMINAL HYSTERECTOMY  1995 approx  . ANTERIOR CERVICAL DECOMP/DISCECTOMY FUSION  11-30-2009   C4 -- C6  . CO2 LASER APPLICATION Bilateral 07/23/2015   Procedure: BILATERAL CO2 LASER ABLATION OF THE VULVA;  Surgeon: Adolphus Birchwood, MD;  Location: [facility] [place];  Service: Gynecology;  Laterality: Bilateral;  . CO2 LASER APPLICATION N/A 02/25/2016   Procedure: CO2 LASER OF THE VULVAR;  Surgeon: Adolphus Birchwood, MD;  Location: [facility] [place];  Service: Gynecology;  Laterality: N/A;  . CO2 LASER APPLICATION N/A 05/04/2017   Procedure: CO2 LASER VAPORIZATION OF THE VULVA;  Surgeon: Adolphus Birchwood, MD;  Location: [facility] [place];  Service: Gynecology;  Laterality: N/A;  . CO2 LASER APPLICATION N/A 12/27/2018   Procedure: CO2 LASER APPLICATION OF VULVA;  Surgeon: Adolphus Birchwood, MD;  Location: [facility] [place];  Service: Gynecology;  Laterality: N/A;  . COLONOSCOPY  01-28-2015  . CORONARY ARTERY BYPASS GRAFT N/A 11/25/2017   Procedure: CORONARY ARTERY BYPASS GRAFTING (CABG), ON PUMP, TIMES THREE, USING LEFT INTERNAL MAMMARY ARTERY AND ENDOSCOPICALLY HARVESTED LEFT GREATER SAPHENOUS VEIN;  Surgeon: Purcell Nails, MD;  Location: [facility] OR;  Service: Open Heart Surgery;  Laterality: N/A;  . I & D LEFT BUTTOCK ABSCESS  04-28-2001  . LEFT HEART CATH AND CORONARY ANGIOGRAPHY N/A 11/22/2017   Procedure: LEFT HEART CATH AND CORONARY ANGIOGRAPHY;  Surgeon: Runell Gess, MD;  Location: [facility];  Service: Cardiovascular;  Laterality: N/A;  .  TEE WITHOUT CARDIOVERSION N/A 11/25/2017   Procedure: TRANSESOPHAGEAL ECHOCARDIOGRAM (TEE);  Surgeon: Purcell Nails, MD;  Location: [facility] OR;  Service: Open Heart Surgery;  Laterality: N/A;  . VULVECTOMY Right 05/06/2015   Procedure: WIDE LOCAL  EXCISION  OF RIGHT VULVA;  Surgeon: Adolphus Birchwood, MD;  Location: [facility] [place];  Service: Gynecology;  Laterality: Right;  Marland Kitchen VULVECTOMY N/A 05/04/2017    Procedure: WIDE EXCISION VULVECTOMY;  Surgeon: Adolphus Birchwood, MD;  Location: [facility];  Service: Gynecology;  Laterality: N/A;  . VULVECTOMY N/A 12/27/2018   Procedure: WIDE EXCISION VULVECTOMY;  Surgeon: Adolphus Birchwood, MD;  Location: [facility];  Service: Gynecology;  Laterality: N/A;    Family History  Problem Relation Age of Onset  . Cataracts Mother   . Hypertension Father   . CAD Paternal Grandfather   . Colon cancer Neg Hx     Social History   Socioeconomic History  . Marital status: Married    Spouse name: Not on file  . Number of children: Not on file  . Years of education: Not on file  . Highest education level: Not on file  Occupational History  . Not on file  Tobacco Use  . Smoking status: Never Smoker  . Smokeless tobacco: Never Used  Substance and Sexual Activity  . Alcohol use: No    Alcohol/week: 0.0 standard drinks  . Drug use: No  . Sexual activity: Yes    Partners: Male    Birth control/protection: Condom    Comment: pt. given condoms  Other Topics Concern  . Not on file  Social History Narrative  . Not on file   Social Determinants of Health   Financial Resource Strain:   . Difficulty of Paying Living Expenses: Not on file  Food Insecurity:   . Worried About Programme researcher, broadcasting/film/video in the Last Year: Not on file  . Ran Out of Food in the Last Year: Not on file  Transportation Needs:   . Lack of Transportation (Medical): Not on file  . Lack of Transportation (Non-Medical): Not on file  Physical Activity:   . Days of Exercise per Week: Not on file  . Minutes of Exercise per Session: Not on file  Stress:   . Feeling of Stress : Not on file  Social Connections:   . Frequency of Communication with Friends and Family: Not on file  . Frequency of Social Gatherings with Friends and Family: Not on file  . Attends Religious Services: Not on file  . Active Member of Clubs or Organizations: Not on file  . Attends Tax inspector Meetings: Not on file  . Marital Status: Not on file  Intimate Partner Violence:   . Fear of Current or Ex-Partner: Not on file  . Emotionally Abused: Not on file  . Physically Abused: Not on file  . Sexually Abused: Not on file    Outpatient Medications Prior to Visit  Medication Sig Dispense Refill  . acetaminophen (TYLENOL) 325 MG tablet Take 650 mg by mouth every 6 (six) hours as needed.    Marland Kitchen aspirin EC 325 MG EC tablet Take 1 tablet (325 mg total) by mouth daily. 30 tablet 0  . atorvastatin (LIPITOR) 20 MG tablet     . cyclobenzaprine (FLEXERIL) 10 MG tablet     . darunavir (PREZISTA) 800 MG tablet TAKE 1 TABLET(800 MG) BY MOUTH DAILY 30 tablet 5  . elvitegravir-cobicistat-emtricitabine-tenofovir (GENVOYA) 150-150-200-10 MG TABS tablet Take 1 tablet  by mouth daily with breakfast. 30 tablet 5  . furosemide (LASIX) 40 MG tablet TAKE 1 TABLET(40 MG) BY MOUTH DAILY 90 tablet 3  . gabapentin (NEURONTIN) 100 MG capsule Take 2 capsules (Total = 200 mg), by mouth, 2 times daily. 120 capsule 3  . metoprolol tartrate (LOPRESSOR) 25 MG tablet Take 1.5 tablets (37.5 mg total) by mouth 2 (two) times daily. 270 tablet 2   No facility-administered medications prior to visit.    No Known Allergies  ROS Review of Systems  Constitutional: Negative.   HENT: Negative.   Eyes: Negative.   Respiratory: Negative.   Cardiovascular: Negative.   Gastrointestinal: Negative.   Endocrine: Negative.   Genitourinary: Negative.   Musculoskeletal: Positive for arthralgias (bilateral lower extremity pain).  Skin: Negative.   Allergic/Immunologic: Negative.   Neurological: Positive for dizziness (occasional ) and headaches (occasional ).  Hematological: Negative.   Psychiatric/Behavioral: Negative.     Objective:    Physical Exam  Constitutional: She is oriented to person, place, and time. She appears well-developed and well-nourished.  HENT:  Head: Normocephalic and atraumatic.   Eyes: Conjunctivae are normal.  Cardiovascular: Normal rate, regular rhythm, normal heart sounds and intact distal pulses.  Pulmonary/Chest: Effort normal and breath sounds normal.  Abdominal: Soft. Bowel sounds are normal.  Musculoskeletal:        General: Normal range of motion.     Cervical back: Normal range of motion and neck supple.  Neurological: She is alert and oriented to person, place, and time. She has normal reflexes.  Bilateral peripheral neuropathy.   Skin: Skin is warm and dry.  Psychiatric: She has a normal mood and affect. Her behavior is normal. Judgment and thought content normal.  Nursing note and vitals reviewed.   BP 114/72   Pulse 84   Temp 97.9 F (36.6 C)   Ht 5\' 2"  (1.575 m)   Wt 159 lb 12.8 oz (72.5 kg)   SpO2 100%   BMI 29.23 kg/m  Wt Readings from Last 3 Encounters:  12/01/19 159 lb 12.8 oz (72.5 kg)  11/06/19 158 lb (71.7 kg)  08/16/19 161 lb 6.4 oz (73.2 kg)     Health Maintenance Due  Topic Date Due  . PAP SMEAR-Modifier  12/14/2019    There are no preventive care reminders to display for this patient.  Lab Results  Component Value Date   TSH 1.050 08/16/2019   Lab Results  Component Value Date   WBC 3.4 (L) 08/01/2019   HGB 12.9 08/01/2019   HCT 37.6 08/01/2019   MCV 96.9 08/01/2019   PLT 172 08/01/2019   Lab Results  Component Value Date   NA 142 08/01/2019   K 4.0 08/01/2019   CO2 27 08/01/2019   GLUCOSE 126 (H) 08/01/2019   BUN 12 08/01/2019   CREATININE 0.94 08/01/2019   BILITOT 0.7 08/01/2019   ALKPHOS 87 12/08/2017   AST 31 08/01/2019   ALT 45 (H) 08/01/2019   PROT 7.3 08/01/2019   ALBUMIN 4.6 12/08/2017   CALCIUM 9.6 08/01/2019   ANIONGAP 8 12/27/2018   Lab Results  Component Value Date   CHOL 143 08/16/2019   Lab Results  Component Value Date   HDL 44 08/16/2019   Lab Results  Component Value Date   LDLCALC 71 08/16/2019   Lab Results  Component Value Date   TRIG 162 (H) 08/16/2019   Lab  Results  Component Value Date   CHOLHDL 3.3 08/16/2019   Lab Results  Component  Value Date   HGBA1C 6.3 (A) 12/01/2019      Assessment & Plan:   1. Essential hypertension The current medical regimen is effective; blood pressure is stable at 114/72 today; continue present plan and medications as prescribed. She will continue to take medications as prescribed, to decrease high sodium intake, excessive alcohol intake, increase potassium intake, smoking cessation, and increase physical activity of at least 30 minutes of cardio activity daily. Sh will continue to follow Heart Healthy or DASH diet.  2. Prediabetes Hgb A1c stable at  - POCT glycosylated hemoglobin (Hb A1C) - POCT urinalysis dipstick - POCT glucose (manual entry)  3. Chronic pain of lower extremity, unspecified laterality Moderate today. We will assess Magnesium in a few weeks.   4. Neuropathy  5. Human immunodeficiency virus (HIV) disease (HCC) She will continue to follow up with Infection Disease.   6. Urinary tract infection without hematuria, site unspecified We will initiate Bactrim today.  - sulfamethoxazole-trimethoprim (BACTRIM DS) 800-160 MG tablet; Take 1 tablet by mouth 2 (two) times daily.  Dispense: 14 tablet; Refill: 0  7. Abnormal urinalysis Results are pending.  - Urine Culture  8. Follow up She will follow up in 1 week for Magnesium lab.  She will up in 6 months for 6 months.    Meds ordered this encounter  Medications  . sulfamethoxazole-trimethoprim (BACTRIM DS) 800-160 MG tablet    Sig: Take 1 tablet by mouth 2 (two) times daily.    Dispense:  14 tablet    Refill:  0    Orders Placed This Encounter  Procedures  . Urine Culture  . Magnesium  . POCT glycosylated hemoglobin (Hb A1C)  . POCT urinalysis dipstick  . POCT glucose (manual entry)    Referral Orders  No referral(s) requested today    Raliegh Ip,  MSN, FNP-BC [facility] [address] [place], [place] [address] [phone]- fax  Problem List Items Addressed This Visit      Cardiovascular and Mediastinum   Essential hypertension - Primary     Nervous and Auditory   Neuropathy     Other   Human immunodeficiency virus (HIV) disease (HCC) (Chronic)   Relevant Medications   sulfamethoxazole-trimethoprim (BACTRIM DS) 800-160 MG tablet    Other Visit Diagnoses    Prediabetes       Relevant Orders   POCT glycosylated hemoglobin (Hb A1C) (Completed)   POCT urinalysis dipstick (Completed)   POCT glucose (manual entry) (Completed)   Chronic pain of lower extremity, unspecified laterality       Relevant Orders   Magnesium   Urinary tract infection without hematuria, site unspecified       Relevant Medications   sulfamethoxazole-trimethoprim (BACTRIM DS) 800-160 MG tablet   Abnormal urinalysis       Relevant Orders   Urine Culture (Completed)   Follow up          Meds ordered this encounter  Medications  . sulfamethoxazole-trimethoprim (BACTRIM DS) 800-160 MG tablet    Sig: Take 1 tablet by mouth 2 (two) times daily.    Dispense:  14 tablet    Refill:  0    Follow-up: Return in about 6 months (around 05/30/2020).    Kallie Locks, FNP

## 2019-12-03 DIAGNOSIS — G8929 Other chronic pain: Secondary | ICD-10-CM | POA: Insufficient documentation

## 2019-12-03 DIAGNOSIS — R7303 Prediabetes: Secondary | ICD-10-CM | POA: Insufficient documentation

## 2019-12-03 LAB — URINE CULTURE

## 2019-12-12 ENCOUNTER — Other Ambulatory Visit: Payer: Medicare HMO

## 2019-12-12 ENCOUNTER — Other Ambulatory Visit: Payer: Self-pay

## 2019-12-12 ENCOUNTER — Telehealth: Payer: Self-pay | Admitting: Family Medicine

## 2019-12-12 DIAGNOSIS — M79606 Pain in leg, unspecified: Secondary | ICD-10-CM

## 2019-12-12 DIAGNOSIS — G8929 Other chronic pain: Secondary | ICD-10-CM | POA: Diagnosis not present

## 2019-12-12 NOTE — Telephone Encounter (Signed)
Patient wanted a call back

## 2019-12-12 NOTE — Telephone Encounter (Signed)
Patient wanted to know if Cortizone injections will help her leg pain?

## 2019-12-13 LAB — MAGNESIUM: Magnesium: 1.7 mg/dL (ref 1.6–2.3)

## 2019-12-14 ENCOUNTER — Other Ambulatory Visit: Payer: Self-pay | Admitting: Family Medicine

## 2019-12-14 DIAGNOSIS — G629 Polyneuropathy, unspecified: Secondary | ICD-10-CM

## 2019-12-14 NOTE — Telephone Encounter (Signed)
Patient requesting Gabapentin refill.

## 2019-12-15 ENCOUNTER — Telehealth: Payer: Self-pay

## 2019-12-15 NOTE — Telephone Encounter (Signed)
Magnesium level normal

## 2020-01-08 ENCOUNTER — Encounter: Payer: Self-pay | Admitting: Infectious Diseases

## 2020-01-14 DIAGNOSIS — Z0100 Encounter for examination of eyes and vision without abnormal findings: Secondary | ICD-10-CM | POA: Diagnosis not present

## 2020-01-15 ENCOUNTER — Telehealth: Payer: Self-pay

## 2020-01-15 ENCOUNTER — Other Ambulatory Visit: Payer: Self-pay | Admitting: Family Medicine

## 2020-01-15 DIAGNOSIS — M79606 Pain in leg, unspecified: Secondary | ICD-10-CM

## 2020-01-15 DIAGNOSIS — G8929 Other chronic pain: Secondary | ICD-10-CM

## 2020-01-15 NOTE — Progress Notes (Signed)
HISTORY AND PHYSICAL     CC:  follow up. Requesting Provider:  Kallie Locks, FNP  HPI: This is a 54 y.o. female who is here today for follow up.  She was last seen in December 2020 for left leg pain and left hip pain.   She had previously been seen in August 2020 for the same issues.  She had normal ABI's in 2019 and December 2020 and venous studies done in August 2020 were normal and no cystic structures were seen in the popliteal fossa.   She also had palpable DP pulses bilaterally.   Given her sx were not related to a vascular issue, she was instructed to f/u with her PCP for further workup.    The pt returns today for the same complaints.  She states that she has pain behind both knees and left buttock and thigh pain.  She states she gets this when she stands or walks.  She does not have any pain in either of her feet and she does not have any non healing wounds.  She states she has tried creams and Mr. Corliss Marcus, which contains epsom salts, but she continues to have pain.  She denies any pain in her back.  She has never had any back surgeries.  She states she was prescribed Neurontin, but she cannot really tell a difference and it makes her sleepy.   She is doing well from her CABG and does not have any chest pain or shortness of breath.  PMH significant for CAD with CABG x 3 in 2019, HLD, HTN, HIV.  The pt is on a statin for cholesterol management.    The pt is on an aspirin.    Other AC:  none The pt is on BB for hypertension.  The pt does not have diabetes. Tobacco hx:  never   Past Medical History:  Diagnosis Date  . Coronary artery disease   . History of condyloma acuminatum   . History of uterine fibroid   . History of vaginal dysplasia    VAIN III--  oncologist-  dr Andrey Farmer  . History of vulvar dysplasia    VIN III   gyn-oncologist-  dr Andrey Farmer  . [facility] infection ([facility]-  dr hatcher  . Hypertension   . Hypertriglyceridemia without  hypercholesterolemia   . PONV (postoperative nausea and vomiting)   . VIN III (vulvar intraepithelial neoplasia III)   . Wears glasses     Past Surgical History:  Procedure Laterality Date  . ABDOMINAL HYSTERECTOMY  1995 approx  . ANTERIOR CERVICAL DECOMP/DISCECTOMY FUSION  11-30-2009   C4 -- C6  . CO2 LASER APPLICATION Bilateral 07/23/2015   Procedure: BILATERAL CO2 LASER ABLATION OF THE VULVA;  Surgeon: Adolphus Birchwood, MD;  Location: [facility] [place];  Service: Gynecology;  Laterality: Bilateral;  . CO2 LASER APPLICATION N/A 02/25/2016   Procedure: CO2 LASER OF THE VULVAR;  Surgeon: Adolphus Birchwood, MD;  Location: [facility] [place];  Service: Gynecology;  Laterality: N/A;  . CO2 LASER APPLICATION N/A 05/04/2017   Procedure: CO2 LASER VAPORIZATION OF THE VULVA;  Surgeon: Adolphus Birchwood, MD;  Location: [facility] [place];  Service: Gynecology;  Laterality: N/A;  . CO2 LASER APPLICATION N/A 12/27/2018   Procedure: CO2 LASER APPLICATION OF VULVA;  Surgeon: Adolphus Birchwood, MD;  Location: [facility];  Service: Gynecology;  Laterality: N/A;  . COLONOSCOPY  01-28-2015  . CORONARY ARTERY BYPASS GRAFT N/A 11/25/2017   Procedure:  CORONARY ARTERY BYPASS GRAFTING (CABG), ON PUMP, TIMES THREE, USING LEFT INTERNAL MAMMARY ARTERY AND ENDOSCOPICALLY HARVESTED LEFT GREATER SAPHENOUS VEIN;  Surgeon: Purcell Nails, MD;  Location: [facility] OR;  Service: Open Heart Surgery;  Laterality: N/A;  . I & D LEFT BUTTOCK ABSCESS  04-28-2001  . LEFT HEART CATH AND CORONARY ANGIOGRAPHY N/A 11/22/2017   Procedure: LEFT HEART CATH AND CORONARY ANGIOGRAPHY;  Surgeon: Runell Gess, MD;  Location: [facility];  Service: Cardiovascular;  Laterality: N/A;  . TEE WITHOUT CARDIOVERSION N/A 11/25/2017   Procedure: TRANSESOPHAGEAL ECHOCARDIOGRAM (TEE);  Surgeon: Purcell Nails, MD;  Location: [facility] OR;  Service: Open Heart Surgery;  Laterality: N/A;  . VULVECTOMY Right 05/06/2015   Procedure: WIDE  LOCAL  EXCISION  OF RIGHT VULVA;  Surgeon: Adolphus Birchwood, MD;  Location: [facility] [place];  Service: Gynecology;  Laterality: Right;  Marland Kitchen VULVECTOMY N/A 05/04/2017   Procedure: WIDE EXCISION VULVECTOMY;  Surgeon: Adolphus Birchwood, MD;  Location: [facility];  Service: Gynecology;  Laterality: N/A;  . VULVECTOMY N/A 12/27/2018   Procedure: WIDE EXCISION VULVECTOMY;  Surgeon: Adolphus Birchwood, MD;  Location: [facility];  Service: Gynecology;  Laterality: N/A;    No Known Allergies  Current Outpatient Medications  Medication Sig Dispense Refill  . acetaminophen (TYLENOL) 325 MG tablet Take 650 mg by mouth every 6 (six) hours as needed.    Marland Kitchen aspirin EC 325 MG EC tablet Take 1 tablet (325 mg total) by mouth daily. 30 tablet 0  . atorvastatin (LIPITOR) 20 MG tablet     . cyclobenzaprine (FLEXERIL) 10 MG tablet     . darunavir (PREZISTA) 800 MG tablet TAKE 1 TABLET(800 MG) BY MOUTH DAILY 30 tablet 5  . elvitegravir-cobicistat-emtricitabine-tenofovir (GENVOYA) 150-150-200-10 MG TABS tablet Take 1 tablet by mouth daily with breakfast. 30 tablet 5  . furosemide (LASIX) 40 MG tablet TAKE 1 TABLET(40 MG) BY MOUTH DAILY 90 tablet 3  . gabapentin (NEURONTIN) 100 MG capsule TAKE 2 CAPSULES(200 MG) BY MOUTH TWICE DAILY 120 capsule 3  . metoprolol tartrate (LOPRESSOR) 25 MG tablet Take 1.5 tablets (37.5 mg total) by mouth 2 (two) times daily. 270 tablet 2  . sulfamethoxazole-trimethoprim (BACTRIM DS) 800-160 MG tablet Take 1 tablet by mouth 2 (two) times daily. 14 tablet 0   No current facility-administered medications for this visit.    Family History  Problem Relation Age of Onset  . Cataracts Mother   . Hypertension Father   . CAD Paternal Grandfather   . Colon cancer Neg Hx     Social History   Socioeconomic History  . Marital status: Married    Spouse name: Not on file  . Number of children: Not on file  . Years of education: Not on file  . Highest education level:  Not on file  Occupational History  . Not on file  Tobacco Use  . Smoking status: Never Smoker  . Smokeless tobacco: Never Used  Substance and Sexual Activity  . Alcohol use: No    Alcohol/week: 0.0 standard drinks  . Drug use: No  . Sexual activity: Yes    Partners: Male    Birth control/protection: Condom    Comment: pt. given condoms  Other Topics Concern  . Not on file  Social History Narrative  . Not on file   Social Determinants of Health   Financial Resource Strain:   . Difficulty of Paying Living Expenses: Not on file  Food Insecurity:   .  Worried About Programme researcher, broadcasting/film/video in the Last Year: Not on file  . Ran Out of Food in the Last Year: Not on file  Transportation Needs:   . Lack of Transportation (Medical): Not on file  . Lack of Transportation (Non-Medical): Not on file  Physical Activity:   . Days of Exercise per Week: Not on file  . Minutes of Exercise per Session: Not on file  Stress:   . Feeling of Stress : Not on file  Social Connections:   . Frequency of Communication with Friends and Family: Not on file  . Frequency of Social Gatherings with Friends and Family: Not on file  . Attends Religious Services: Not on file  . Active Member of Clubs or Organizations: Not on file  . Attends Banker Meetings: Not on file  . Marital Status: Not on file  Intimate Partner Violence:   . Fear of Current or Ex-Partner: Not on file  . Emotionally Abused: Not on file  . Physically Abused: Not on file  . Sexually Abused: Not on file     REVIEW OF SYSTEMS:   [X]  denotes positive finding, [ ]  denotes negative finding Cardiac  Comments:  Chest pain or chest pressure:    Shortness of breath upon exertion:    Short of breath when lying flat:    Irregular heart rhythm:        Vascular    Pain in calf, thigh, or hip brought on by ambulation: x Standing and walking  Pain in feet at night that wakes you up from your sleep:     Blood clot in your veins:     Leg swelling:         Pulmonary    Oxygen at home:    Productive cough:     Wheezing:         Neurologic    Sudden weakness in arms or legs:     Sudden numbness in arms or legs:     Sudden onset of difficulty speaking or slurred speech:    Temporary loss of vision in one eye:     Problems with dizziness:         Gastrointestinal    Blood in stool:     Vomited blood:         Genitourinary    Burning when urinating:     Blood in urine:        Psychiatric    Major depression:         Hematologic    Bleeding problems:    Problems with blood clotting too easily:        Skin    Rashes or ulcers:        Constitutional    Fever or chills:      PHYSICAL EXAMINATION:  Today's Vitals   01/17/20 1012  BP: 127/77  Pulse: 83  Resp: 16  Temp: 99 F (37.2 C)  TempSrc: Oral  SpO2: 96%  Weight: 159 lb (72.1 kg)  Height: 5\' 2"  (1.575 m)   Body mass index is 29.08 kg/m.   General:  WDWN in NAD; vital signs documented above Gait: Not observed HENT: WNL, normocephalic Pulmonary: normal non-labored breathing , without Rales, rhonchi,  wheezing Cardiac: regular HR, without  Murmurs; without carotid bruits Abdomen: soft, NT, no masses Skin: without rashes; well healed endoscopic vein harvest incisions bilaterally. Vascular Exam/Pulses:  Right Left  Radial 2+ (normal) 2+ (normal)  Ulnar .sjpal  1+ (weak)  Popliteal Unable to palpate  Unable to palpate   DP 2+ (normal) 2+ (normal)  PT 2+ (normal) 2+ (normal)   Extremities: without ischemic changes, without Gangrene , without cellulitis; without open wounds;  Her sensory and motor are in tact bilateral feet; no swelling BLE; no masses are palpated in the popliteal fossa bilaterally. Calves are non tender to palpation. Musculoskeletal: no muscle wasting or atrophy  Neurologic: A&O X 3;  No focal weakness or paresthesias are detected Psychiatric:  The pt has Normal affect.   Non-Invasive Vascular Imaging:   Venous  duplex 07/07/2019: Venous Reflux Times Normal value < 0.5 sec +---+----------+---------+  Right (ms)Left (ms) +---+----------+---------+ CFV917.00   +---+----------+---------+  +------------------------------+----------+---------+ VEIN DIAMETERS: Right (cm)Left (cm) +------------------------------+----------+---------+ GSV at Saphenofemoral junction0.69 0.54  +------------------------------+----------+---------+ GSV at prox thigh 0.40 0.20  +------------------------------+----------+---------+ GSV at mid thigh 0.32 0.17  +------------------------------+----------+---------+ GSV at distal thigh 0.29 0.11  +------------------------------+----------+---------+ GSV at knee 0.24 0.15  +------------------------------+----------+---------+ GSV prox calf 0.17 0.09  +------------------------------+----------+---------+ GSV mid calf 0.21 0.18  +------------------------------+----------+---------+ SSV origin 0.24 0.26  +------------------------------+----------+---------+ SSV prox 0.22 0.14  +------------------------------+----------+---------+ SSV mid 0.18 0.22  +------------------------------+----------+---------+  Summary: Right: No reflux was noted in the femoral vein in the thigh, and popliteal vein. Abnormal reflux times were noted in the common femoral vein. There is no evidence of superficial venous thrombosis. No cystic structure found in the popliteal fossa. No evidence of deep vein thrombosis in the common femoral, femoral, or popliteal veins.  Left: No reflux was noted in the common femoral vein , femoral vein in the thigh, and popliteal vein. There is no evidence of  superficial venous thrombosis.There is no evidence of chronic venous insufficiency. No cystic structure found in the popliteal  fossa. No evidence of deep vein thrombosis in the common femoral, femoral, or popliteal veins.  Previous ABI's/TBI's on 11/06/2019: Right:  1.03 - Great toe pressure: 127 Left:  0.98 - Great toe pressure:  121  Venous duplex (right) 04/12/2019: Summary: Right: There is no evidence of deep vein thrombosis in the lower extremity. However, portions of this examination were limited- see technologist comments above. No cystic structure found in the popliteal fossa. Left: No evidence of common femoral vein obstruction.  ABI 11/23/2017: Right:  1.26 Left:  1.09  Carotid duplex 11/23/2017: Right Carotid: There is no evidence of stenosis in the right ICA.  Left Carotid: There is evidence in the left ICA of a 1-39% stenosis. Vertebrals: Both vertebral arteries were patent with antegrade flow. Subclavians: Normal flow hemodynamics were seen in bilateral subclavian arteries.   ASSESSMENT/PLAN:: 54 y.o. female here for follow up for continued pain bilateral popliteal fossas and left buttock and thigh pain  -pt has easily palpable DP/PT pulses bilaterally.  She does not have claudication symptoms or non healing wounds and had normal ABI's with normal toe pressures back in December.   -She does not have any leg swelling or pain in her calves or shortness of breath and her venous duplex was normal back in August.   She does not have any palpable masses in the popliteal fossa bilaterally.  -her pain is not related to any blood flow issues. -she may benefit from MRI and referral to ortho or neuro for further workup.  -discussed this with the pt's mother via telephone and she and pt also request referral from PCP for further workup -she will f/u with [facility] as needed.      Leontine Locket, PA-C [facility] [phone]  Clinic MD:  Fields

## 2020-01-15 NOTE — Telephone Encounter (Signed)
STAT appointment per Raliegh Ip NP for progressive  left pain.  Patient to be seen again by [facility] on 01/17/2020 at 10:15 AM.Patient informed.

## 2020-01-16 ENCOUNTER — Telehealth (HOSPITAL_COMMUNITY): Payer: Self-pay

## 2020-01-16 NOTE — Telephone Encounter (Signed)

## 2020-01-17 ENCOUNTER — Ambulatory Visit (INDEPENDENT_AMBULATORY_CARE_PROVIDER_SITE_OTHER): Payer: Medicare HMO | Admitting: Physician Assistant

## 2020-01-17 ENCOUNTER — Other Ambulatory Visit: Payer: Self-pay

## 2020-01-17 ENCOUNTER — Telehealth: Payer: Self-pay | Admitting: Family Medicine

## 2020-01-17 VITALS — BP 127/77 | HR 83 | Temp 99.0°F | Resp 16 | Ht 62.0 in | Wt 159.0 lb

## 2020-01-17 DIAGNOSIS — M79604 Pain in right leg: Secondary | ICD-10-CM | POA: Diagnosis not present

## 2020-01-17 DIAGNOSIS — M79605 Pain in left leg: Secondary | ICD-10-CM | POA: Diagnosis not present

## 2020-01-17 DIAGNOSIS — M25552 Pain in left hip: Secondary | ICD-10-CM | POA: Diagnosis not present

## 2020-01-17 NOTE — Telephone Encounter (Signed)
Cathy Smith stated the [facility] is sending you  an note about her recent visit. She and her husband would like you to review it and get back to the both of them. As to what to do next.   The both of them are willing to sit down and talk with you.

## 2020-01-17 NOTE — Telephone Encounter (Signed)
Pt's husband called on behalf of pt. Pt went to place she was referred. They stated nothing was wrong, so they want to be referred somewhere else. Please call pt or her husband back.

## 2020-01-19 ENCOUNTER — Encounter: Payer: Self-pay | Admitting: Family Medicine

## 2020-01-19 ENCOUNTER — Ambulatory Visit (INDEPENDENT_AMBULATORY_CARE_PROVIDER_SITE_OTHER): Payer: Medicare HMO | Admitting: Family Medicine

## 2020-01-19 ENCOUNTER — Telehealth: Payer: Self-pay | Admitting: Family Medicine

## 2020-01-19 ENCOUNTER — Other Ambulatory Visit: Payer: Self-pay

## 2020-01-19 VITALS — BP 126/87 | HR 81 | Temp 98.4°F | Ht 62.0 in | Wt 161.6 lb

## 2020-01-19 DIAGNOSIS — I10 Essential (primary) hypertension: Secondary | ICD-10-CM

## 2020-01-19 DIAGNOSIS — Z09 Encounter for follow-up examination after completed treatment for conditions other than malignant neoplasm: Secondary | ICD-10-CM | POA: Diagnosis not present

## 2020-01-19 DIAGNOSIS — B20 Human immunodeficiency virus [HIV] disease: Secondary | ICD-10-CM

## 2020-01-19 DIAGNOSIS — M79605 Pain in left leg: Secondary | ICD-10-CM

## 2020-01-19 DIAGNOSIS — G8929 Other chronic pain: Secondary | ICD-10-CM | POA: Diagnosis not present

## 2020-01-19 DIAGNOSIS — G629 Polyneuropathy, unspecified: Secondary | ICD-10-CM

## 2020-01-19 DIAGNOSIS — M79604 Pain in right leg: Secondary | ICD-10-CM | POA: Diagnosis not present

## 2020-01-19 MED ORDER — GABAPENTIN 300 MG PO CAPS: 300.0000 mg | ORAL_CAPSULE | Freq: Three times a day (TID) | ORAL | 3 refills | Status: DC

## 2020-01-19 NOTE — Progress Notes (Signed)
[facility]   Sick Visit  Subjective:  Patient ID: Cathy Smith, female    DOB: Sep 28, 1966  Age: 54 y.o. MRN: [id number]  CC:  Chief Complaint  Patient presents with  . Leg Pain    both legs    HPI Cathy Smith is a 54 year old female who presents for Follow Up today.   Past Medical History:  Diagnosis Date  . Coronary artery disease   . History of condyloma acuminatum   . History of uterine fibroid   . History of vaginal dysplasia    VAIN III--  oncologist-  dr Andrey Farmer  . History of vulvar dysplasia    VIN III   gyn-oncologist-  dr Andrey Farmer  . HIV infection (HCC) [facility]-  dr hatcher  . Hypertension   . Hypertriglyceridemia without hypercholesterolemia   . PONV (postoperative nausea and vomiting)   . VIN III (vulvar intraepithelial neoplasia III)   . Wears glasses     Current Status: Since her last office visit, she is doing well with no complaints. She continues to have increased chronic bilateral leg pain. She is currently taking Gabapentin and using a heating pain for aid in pain relief. She denies visual changes, chest pain, cough, shortness of breath, heart palpitations, and falls. She has occasional headaches and dizziness with position changes. Denies severe headaches, confusion, seizures, double vision, and blurred vision, nausea and vomiting. She continues to follow up with Infection Disease a needed. She denies fevers, chills, fatigue, recent infections, weight loss, and night sweats. No reports of GI problems such as nausea, vomiting, diarrhea, and constipation. She has no reports of blood in stools, dysuria and hematuria. No depression or anxiety reported today. She denies suicidal ideations, homicidal ideations, or auditory hallucinations. She denies pain today.     Past Surgical History:  Procedure Laterality Date  . ABDOMINAL HYSTERECTOMY  1995 approx  . ANTERIOR CERVICAL  DECOMP/DISCECTOMY FUSION  11-30-2009   C4 -- C6  . CO2 LASER APPLICATION Bilateral 07/23/2015   Procedure: BILATERAL CO2 LASER ABLATION OF THE VULVA;  Surgeon: Adolphus Birchwood, MD;  Location: [facility];  Service: Gynecology;  Laterality: Bilateral;  . CO2 LASER APPLICATION N/A 02/25/2016   Procedure: CO2 LASER OF THE VULVAR;  Surgeon: Adolphus Birchwood, MD;  Location: [facility] [place];  Service: Gynecology;  Laterality: N/A;  . CO2 LASER APPLICATION N/A 05/04/2017   Procedure: CO2 LASER VAPORIZATION OF THE VULVA;  Surgeon: Adolphus Birchwood, MD;  Location: [facility] [place];  Service: Gynecology;  Laterality: N/A;  . CO2 LASER APPLICATION N/A 12/27/2018   Procedure: CO2 LASER APPLICATION OF VULVA;  Surgeon: Adolphus Birchwood, MD;  Location: [facility] [place];  Service: Gynecology;  Laterality: N/A;  . COLONOSCOPY  01-28-2015  . CORONARY ARTERY BYPASS GRAFT N/A 11/25/2017   Procedure: CORONARY ARTERY BYPASS GRAFTING (CABG), ON PUMP, TIMES THREE, USING LEFT INTERNAL MAMMARY ARTERY AND ENDOSCOPICALLY HARVESTED LEFT GREATER SAPHENOUS VEIN;  Surgeon: Purcell Nails, MD;  Location: [facility];  Service: Open Heart Surgery;  Laterality: N/A;  . I & D LEFT BUTTOCK ABSCESS  04-28-2001  . LEFT HEART CATH AND CORONARY ANGIOGRAPHY N/A 11/22/2017   Procedure: LEFT HEART CATH AND CORONARY ANGIOGRAPHY;  Surgeon: Runell Gess, MD;  Location: [facility];  Service: Cardiovascular;  Laterality: N/A;  . TEE WITHOUT CARDIOVERSION N/A 11/25/2017   Procedure: TRANSESOPHAGEAL ECHOCARDIOGRAM (TEE);  Surgeon: Tressie Stalker  H, MD;  Location: [place];  Service: Open Heart Surgery;  Laterality: N/A;  . VULVECTOMY Right 05/06/2015   Procedure: WIDE LOCAL  EXCISION  OF RIGHT VULVA;  Surgeon: Everitt Amber, MD;  Location: [place];  Service: Gynecology;  Laterality: Right;  Marland Kitchen VULVECTOMY N/A 05/04/2017   Procedure: WIDE EXCISION VULVECTOMY;  Surgeon: Everitt Amber, MD;  Location: [facility];  Service: Gynecology;  Laterality: N/A;  . VULVECTOMY N/A 12/27/2018   Procedure: WIDE EXCISION VULVECTOMY;  Surgeon: Everitt Amber, MD;  Location: [facility];  Service: Gynecology;  Laterality: N/A;    Family History  Problem Relation Age of Onset  . Cataracts Mother   . Hypertension Father   . CAD Paternal Grandfather   . Colon cancer Neg Hx     Social History   Socioeconomic History  . Marital status: Married    Spouse name: Not on file  . Number of children: Not on file  . Years of education: Not on file  . Highest education level: Not on file  Occupational History  . Not on file  Tobacco Use  . Smoking status: Never Smoker  . Smokeless tobacco: Never Used  Substance and Sexual Activity  . Alcohol use: No    Alcohol/week: 0.0 standard drinks  . Drug use: No  . Sexual activity: Yes    Partners: Male    Birth control/protection: Condom    Comment: pt. given condoms  Other Topics Concern  . Not on file  Social History Narrative  . Not on file   Social Determinants of Health   Financial Resource Strain:   . Difficulty of Paying Living Expenses:   Food Insecurity:   . Worried About Charity fundraiser in the Last Year:   . Arboriculturist in the Last Year:   Transportation Needs:   . Film/video editor (Medical):   Marland Kitchen Lack of Transportation (Non-Medical):   Physical Activity:   . Days of Exercise per Week:   . Minutes of Exercise per Session:   Stress:   . Feeling of Stress :   Social Connections:   . Frequency of Communication with Friends and Family:   . Frequency of Social Gatherings with Friends and Family:   . Attends Religious Services:   . Active Member of Clubs or Organizations:   . Attends Archivist Meetings:   Marland Kitchen Marital Status:   Intimate Partner Violence:   . Fear of Current or Ex-Partner:   . Emotionally Abused:   Marland Kitchen Physically Abused:   . Sexually Abused:     Outpatient Medications Prior to Visit   Medication Sig Dispense Refill  . acetaminophen (TYLENOL) 325 MG tablet Take 650 mg by mouth every 6 (six) hours as needed.    Marland Kitchen aspirin EC 325 MG EC tablet Take 1 tablet (325 mg total) by mouth daily. 30 tablet 0  . atorvastatin (LIPITOR) 20 MG tablet     . cyclobenzaprine (FLEXERIL) 10 MG tablet     . darunavir (PREZISTA) 800 MG tablet TAKE 1 TABLET(800 MG) BY MOUTH DAILY 30 tablet 5  . elvitegravir-cobicistat-emtricitabine-tenofovir (GENVOYA) 150-150-200-10 MG TABS tablet Take 1 tablet by mouth daily with breakfast. 30 tablet 5  . furosemide (LASIX) 40 MG tablet TAKE 1 TABLET(40 MG) BY MOUTH DAILY 90 tablet 3  . metoprolol tartrate (LOPRESSOR) 25 MG tablet Take 1.5 tablets (37.5 mg total) by mouth 2 (two) times daily. 270 tablet 2  . gabapentin (  NEURONTIN) 100 MG capsule TAKE 2 CAPSULES(200 MG) BY MOUTH TWICE DAILY 120 capsule 3  . sulfamethoxazole-trimethoprim (BACTRIM DS) 800-160 MG tablet Take 1 tablet by mouth 2 (two) times daily. 14 tablet 0   No facility-administered medications prior to visit.    No Known Allergies  ROS Review of Systems  Constitutional: Negative.   HENT: Negative.   Eyes: Negative.   Respiratory: Negative.   Cardiovascular: Negative.   Gastrointestinal: Positive for abdominal distention (obese).  Endocrine: Negative.   Genitourinary: Negative.   Musculoskeletal: Positive for myalgias (bilateral chronic lower extremity pain ).  Skin: Negative.   Allergic/Immunologic: Negative.   Neurological: Positive for dizziness (occasional ) and headaches (occasional ).  Hematological: Negative.   Psychiatric/Behavioral: Negative.       Objective:    Physical Exam  Constitutional: She is oriented to person, place, and time. She appears well-developed and well-nourished.  HENT:  Head: Normocephalic and atraumatic.  Eyes: Conjunctivae are normal.  Cardiovascular: Normal rate, regular rhythm, normal heart sounds and intact distal pulses.  Pulmonary/Chest:  Effort normal and breath sounds normal.  Abdominal: Soft. Bowel sounds are normal.  Musculoskeletal:        General: Normal range of motion.     Cervical back: Neck supple.  Neurological: She is alert and oriented to person, place, and time. She has normal reflexes.  Skin: Skin is warm and dry.  Psychiatric: She has a normal mood and affect. Her behavior is normal. Judgment and thought content normal.  Nursing note and vitals reviewed.   BP 126/87   Pulse 81   Temp 98.4 F (36.9 C) (Oral)   Ht 5\' 2"  (1.575 m)   Wt 161 lb 9.6 oz (73.3 kg)   SpO2 100%   BMI 29.56 kg/m  Wt Readings from Last 3 Encounters:  01/19/20 161 lb 9.6 oz (73.3 kg)  01/17/20 159 lb (72.1 kg)  12/01/19 159 lb 12.8 oz (72.5 kg)     Health Maintenance Due  Topic Date Due  . PAP SMEAR-Modifier  12/14/2019    There are no preventive care reminders to display for this patient.  Lab Results  Component Value Date   TSH 1.050 08/16/2019   Lab Results  Component Value Date   WBC 3.4 (L) 08/01/2019   HGB 12.9 08/01/2019   HCT 37.6 08/01/2019   MCV 96.9 08/01/2019   PLT 172 08/01/2019   Lab Results  Component Value Date   NA 142 08/01/2019   K 4.0 08/01/2019   CO2 27 08/01/2019   GLUCOSE 126 (H) 08/01/2019   BUN 12 08/01/2019   CREATININE 0.94 08/01/2019   BILITOT 0.7 08/01/2019   ALKPHOS 87 12/08/2017   AST 31 08/01/2019   ALT 45 (H) 08/01/2019   PROT 7.3 08/01/2019   ALBUMIN 4.6 12/08/2017   CALCIUM 9.6 08/01/2019   ANIONGAP 8 12/27/2018   Lab Results  Component Value Date   CHOL 143 08/16/2019   Lab Results  Component Value Date   HDL 44 08/16/2019   Lab Results  Component Value Date   LDLCALC 71 08/16/2019   Lab Results  Component Value Date   TRIG 162 (H) 08/16/2019   Lab Results  Component Value Date   CHOLHDL 3.3 08/16/2019   Lab Results  Component Value Date   HGBA1C 6.3 (A) 12/01/2019      Assessment & Plan:   1. Chronic pain of lower extremity, bilateral -  gabapentin (NEURONTIN) 300 MG capsule; Take 1 capsule (300 mg total)  by mouth 3 (three) times daily.  Dispense: 90 capsule; Refill: 3 - AMB referral to orthopedics  2. Essential hypertension The current medical regimen is effective; blood pressure is stable at 126/87 today; continue present plan and medications as prescribed. She will continue to take medications as prescribed, to decrease high sodium intake, excessive alcohol intake, increase potassium intake, smoking cessation, and increase physical activity of at least 30 minutes of cardio activity daily. She will continue to follow Heart Healthy or DASH diet.  3. Neuropathy We will increase dosage of Gabapentin today. - gabapentin (NEURONTIN) 300 MG capsule; Take 1 capsule (300 mg total) by mouth 3 (three) times daily.  Dispense: 90 capsule; Refill: 3 - AMB referral to orthopedics  4. Human immunodeficiency virus (HIV) disease (HCC) She will continue to follow up with Infection Disease as needed.   5. Follow up Keep scheduled follow up appointment.   Meds ordered this encounter  Medications  . gabapentin (NEURONTIN) 300 MG capsule    Sig: Take 1 capsule (300 mg total) by mouth 3 (three) times daily.    Dispense:  90 capsule    Refill:  3   Orders Placed This Encounter  Procedures  . AMB referral to orthopedics     Referral Orders     AMB referral to orthopedics    Current Outpatient Medications on File Prior to Visit  Medication Sig Dispense Refill  . acetaminophen (TYLENOL) 325 MG tablet Take 650 mg by mouth every 6 (six) hours as needed.    Marland Kitchen aspirin EC 325 MG EC tablet Take 1 tablet (325 mg total) by mouth daily. 30 tablet 0  . atorvastatin (LIPITOR) 20 MG tablet     . cyclobenzaprine (FLEXERIL) 10 MG tablet     . darunavir (PREZISTA) 800 MG tablet TAKE 1 TABLET(800 MG) BY MOUTH DAILY 30 tablet 5  . elvitegravir-cobicistat-emtricitabine-tenofovir (GENVOYA) 150-150-200-10 MG TABS tablet Take 1 tablet by mouth daily with  breakfast. 30 tablet 5  . furosemide (LASIX) 40 MG tablet TAKE 1 TABLET(40 MG) BY MOUTH DAILY 90 tablet 3  . metoprolol tartrate (LOPRESSOR) 25 MG tablet Take 1.5 tablets (37.5 mg total) by mouth 2 (two) times daily. 270 tablet 2   No current facility-administered medications on file prior to visit.   Problem List Items Addressed This Visit      Cardiovascular and Mediastinum   Essential hypertension     Nervous and Auditory   Neuropathy   Relevant Medications   gabapentin (NEURONTIN) 300 MG capsule   Other Relevant Orders   AMB referral to orthopedics     Other   Chronic pain of lower extremity - Primary   Relevant Medications   gabapentin (NEURONTIN) 300 MG capsule   Human immunodeficiency virus (HIV) disease (HCC) (Chronic)    Other Visit Diagnoses    Follow up          Meds ordered this encounter  Medications  . gabapentin (NEURONTIN) 300 MG capsule    Sig: Take 1 capsule (300 mg total) by mouth 3 (three) times daily.    Dispense:  90 capsule    Refill:  3    Follow-up: No follow-ups on file.    Kallie Locks, FNP

## 2020-01-19 NOTE — Telephone Encounter (Signed)
error 

## 2020-01-19 NOTE — Patient Instructions (Signed)
Neuropathic Pain Neuropathic pain is pain caused by damage to the nerves that are responsible for certain sensations in your body (sensory nerves). The pain can be caused by:  Damage to the sensory nerves that send signals to your spinal cord and brain (peripheral nervous system).  Damage to the sensory nerves in your brain or spinal cord (central nervous system). Neuropathic pain can make you more sensitive to pain. Even a minor sensation can feel very painful. This is usually a long-term condition that can be difficult to treat. The type of pain differs from person to person. It may:  Start suddenly (acute), or it may develop slowly and last for a long time (chronic).  Come and go as damaged nerves heal, or it may stay at the same level for years.  Cause emotional distress, loss of sleep, and a lower quality of life. What are the causes? The most common cause of this condition is diabetes. Many other diseases and conditions can also cause neuropathic pain. Causes of neuropathic pain can be classified as:  Toxic. This is caused by medicines and chemicals. The most common cause of toxic neuropathic pain is damage from cancer treatments (chemotherapy).  Metabolic. This can be caused by: ? Diabetes. This is the most common disease that damages the nerves. ? Lack of vitamin B from long-term alcohol abuse.  Traumatic. Any injury that cuts, crushes, or stretches a nerve can cause damage and pain. A common example is feeling pain after losing an arm or leg (phantom limb pain).  Compression-related. If a sensory nerve gets trapped or compressed for a long period of time, the blood supply to the nerve can be cut off.  Vascular. Many blood vessel diseases can cause neuropathic pain by decreasing blood supply and oxygen to nerves.  Autoimmune. This type of pain results from diseases in which the body's defense system (immune system) mistakenly attacks sensory nerves. Examples of autoimmune diseases  that can cause neuropathic pain include lupus and multiple sclerosis.  Infectious. Many types of viral infections can damage sensory nerves and cause pain. Shingles infection is a common cause of this type of pain.  Inherited. Neuropathic pain can be a symptom of many diseases that are passed down through families (genetic). What increases the risk? You are more likely to develop this condition if:  You have diabetes.  You smoke.  You drink too much alcohol.  You are taking certain medicines, including medicines that kill cancer cells (chemotherapy) or that treat immune system disorders. What are the signs or symptoms? The main symptom is pain. Neuropathic pain is often described as:  Burning.  Shock-like.  Stinging.  Hot or cold.  Itching. How is this diagnosed? No single test can diagnose neuropathic pain. It is diagnosed based on:  Physical exam and your symptoms. Your health care provider will ask you about your pain. You may be asked to use a pain scale to describe how bad your pain is.  Tests. These may be done to see if you have a high sensitivity to pain and to help find the cause and location of any sensory nerve damage. They include: ? Nerve conduction studies to test how well nerve signals travel through your sensory nerves (electrodiagnostic testing). ? Stimulating your sensory nerves through electrodes on your skin and measuring the response in your spinal cord and brain (somatosensory evoked potential).  Imaging studies, such as: ? X-rays. ? CT scan. ? MRI. How is this treated? Treatment for neuropathic pain may change   over time. You may need to try different treatment options or a combination of treatments. Some options include:  Treating the underlying cause of the neuropathy, such as diabetes, kidney disease, or vitamin deficiencies.  Stopping medicines that can cause neuropathy, such as chemotherapy.  Medicine to relieve pain. Medicines may  include: ? Prescription or over-the-counter pain medicine. ? Anti-seizure medicine. ? Antidepressant medicines. ? Pain-relieving patches that are applied to painful areas of skin. ? A medicine to numb the area (local anesthetic), which can be injected as a nerve block.  Transcutaneous nerve stimulation. This uses electrical currents to block painful nerve signals. The treatment is painless.  Alternative treatments, such as: ? Acupuncture. ? Meditation. ? Massage. ? Physical therapy. ? Pain management programs. ? Counseling. Follow these instructions at home: Medicines   Take over-the-counter and prescription medicines only as told by your health care provider.  Do not drive or use heavy machinery while taking prescription pain medicine.  If you are taking prescription pain medicine, take actions to prevent or treat constipation. Your health care provider may recommend that you: ? Drink enough fluid to keep your urine pale yellow. ? Eat foods that are high in fiber, such as fresh fruits and vegetables, whole grains, and beans. ? Limit foods that are high in fat and processed sugars, such as fried or sweet foods. ? Take an over-the-counter or prescription medicine for constipation. Lifestyle   Have a good support system at home.  Consider joining a chronic pain support group.  Do not use any products that contain nicotine or tobacco, such as cigarettes and e-cigarettes. If you need help quitting, ask your health care provider.  Do not drink alcohol. General instructions  Learn as much as you can about your condition.  Work closely with all your health care providers to find the treatment plan that works best for you.  Ask your health care provider what activities are safe for you.  Keep all follow-up visits as told by your health care provider. This is important. Contact a health care provider if:  Your pain treatments are not working.  You are having side effects  from your medicines.  You are struggling with tiredness (fatigue), mood changes, depression, or anxiety. Summary  Neuropathic pain is pain caused by damage to the nerves that are responsible for certain sensations in your body (sensory nerves).  Neuropathic pain may come and go as damaged nerves heal, or it may stay at the same level for years.  Neuropathic pain is usually a long-term condition that can be difficult to treat. Consider joining a chronic pain support group. This information is not intended to replace advice given to you by your health care provider. Make sure you discuss any questions you have with your health care provider. Document Revised: 02/16/2019 Document Reviewed: 11/12/2017 Elsevier Patient Education  2020 [employer]  Peripheral Neuropathy Peripheral neuropathy is a type of nerve damage. It affects nerves that carry signals between the spinal cord and the arms, legs, and the rest of the body (peripheral nerves). It does not affect nerves in the spinal cord or brain. In peripheral neuropathy, one nerve or a group of nerves may be damaged. Peripheral neuropathy is a broad category that includes many specific nerve disorders, like diabetic neuropathy, hereditary neuropathy, and carpal tunnel syndrome. What are the causes? This condition may be caused by:  Diabetes. This is the most common cause of peripheral neuropathy.  Nerve injury.  Pressure or stress on a nerve that   lasts a long time.  Lack (deficiency) of B vitamins. This can result from alcoholism, poor diet, or a restricted diet.  Infections.  Autoimmune diseases, such as rheumatoid arthritis and systemic lupus erythematosus.  Nerve diseases that are passed from parent to child (inherited).  Some medicines, such as cancer medicines (chemotherapy).  Poisonous (toxic) substances, such as lead and mercury.  Too little blood flowing to the legs.  Kidney disease.  Thyroid disease. In some cases,  the cause of this condition is not known. What are the signs or symptoms? Symptoms of this condition depend on which of your nerves is damaged. Common symptoms include:  Loss of feeling (numbness) in the feet, hands, or both.  Tingling in the feet, hands, or both.  Burning pain.  Very sensitive skin.  Weakness.  Not being able to move a part of the body (paralysis).  Muscle twitching.  Clumsiness or poor coordination.  Loss of balance.  Not being able to control your bladder.  Feeling dizzy.  Sexual problems. How is this diagnosed? Diagnosing and finding the cause of peripheral neuropathy can be difficult. Your health care provider will take your medical history and do a physical exam. A neurological exam will also be done. This involves checking things that are affected by your brain, spinal cord, and nerves (nervous system). For example, your health care provider will check your reflexes, how you move, and what you can feel. You may have other tests, such as:  Blood tests.  Electromyogram (EMG) and nerve conduction tests. These tests check nerve function and how well the nerves are controlling the muscles.  Imaging tests, such as CT scans or MRI to rule out other causes of your symptoms.  Removing a small piece of nerve to be examined in a lab (nerve biopsy). This is rare.  Removing and examining a small amount of the fluid that surrounds the brain and spinal cord (lumbar puncture). This is rare. How is this treated? Treatment for this condition may involve:  Treating the underlying cause of the neuropathy, such as diabetes, kidney disease, or vitamin deficiencies.  Stopping medicines that can cause neuropathy, such as chemotherapy.  Medicine to relieve pain. Medicines may include: ? Prescription or over-the-counter pain medicine. ? Antiseizure medicine. ? Antidepressants. ? Pain-relieving patches that are applied to painful areas of skin.  Surgery to relieve  pressure on a nerve or to destroy a nerve that is causing pain.  Physical therapy to help improve movement and balance.  Devices to help you move around (assistive devices). Follow these instructions at home: Medicines  Take over-the-counter and prescription medicines only as told by your health care provider. Do not take any other medicines without first asking your health care provider.  Do not drive or use heavy machinery while taking prescription pain medicine. Lifestyle   Do not use any products that contain nicotine or tobacco, such as cigarettes and e-cigarettes. Smoking keeps blood from reaching damaged nerves. If you need help quitting, ask your health care provider.  Avoid or limit alcohol. Too much alcohol can cause a vitamin B deficiency, and vitamin B is needed for healthy nerves.  Eat a healthy diet. This includes: ? Eating foods that are high in fiber, such as fresh fruits and vegetables, whole grains, and beans. ? Limiting foods that are high in fat and processed sugars, such as fried or sweet foods. General instructions   If you have diabetes, work closely with your health care provider to keep your blood sugar   under control.  If you have numbness in your feet: ? Check every day for signs of injury or infection. Watch for redness, warmth, and swelling. ? Wear padded socks and comfortable shoes. These help protect your feet.  Develop a good support system. Living with peripheral neuropathy can be stressful. Consider talking with a mental health specialist or joining a support group.  Use assistive devices and attend physical therapy as told by your health care provider. This may include using a walker or a cane.  Keep all follow-up visits as told by your health care provider. This is important. Contact a health care provider if:  You have new signs or symptoms of peripheral neuropathy.  You are struggling emotionally from dealing with peripheral  neuropathy.  Your pain is not well-controlled. Get help right away if:  You have an injury or infection that is not healing normally.  You develop new weakness in an arm or leg.  You fall frequently. Summary  Peripheral neuropathy is when the nerves in the arms, or legs are damaged, resulting in numbness, weakness, or pain.  There are many causes of peripheral neuropathy, including diabetes, pinched nerves, vitamin deficiencies, autoimmune disease, and hereditary conditions.  Diagnosing and finding the cause of peripheral neuropathy can be difficult. Your health care provider will take your medical history, do a physical exam, and do tests, including blood tests and nerve function tests.  Treatment involves treating the underlying cause of the neuropathy and taking medicines to help control pain. Physical therapy and assistive devices may also help. This information is not intended to replace advice given to you by your health care provider. Make sure you discuss any questions you have with your health care provider. Document Revised: 10/08/2017 Document Reviewed: 01/04/2017 Elsevier Patient Education  2020 [employer]  

## 2020-01-24 ENCOUNTER — Other Ambulatory Visit: Payer: Self-pay

## 2020-01-24 ENCOUNTER — Ambulatory Visit: Payer: Medicare HMO | Admitting: Family Medicine

## 2020-01-24 ENCOUNTER — Encounter: Payer: Self-pay | Admitting: Family Medicine

## 2020-01-24 DIAGNOSIS — M79605 Pain in left leg: Secondary | ICD-10-CM

## 2020-01-24 DIAGNOSIS — M79604 Pain in right leg: Secondary | ICD-10-CM

## 2020-01-24 NOTE — Progress Notes (Signed)
    SUBJECTIVE:   CHIEF COMPLAINT / HPI:   Chronic bilateral lower extremity pain  Patient presents from PCP referral for chronic lower extremity pain.  She currently takes gabapentin 300 mg 3 times daily. She has been taking gabapentin since 08/15/20. She reports that it makes her drowsy. She believes her pain started a month ago, but does note that she had pain in late fall that she was [facility]. Patient reports pain is located mostly on right rip area and aching feeling spreads down her lower leg towards her knee, but not passed the knee. She reports that it hurts worse when she is standing up and typically washes dishes while sitting on a barstool or will have to lean over the counter to make her legs feel better.  She denies any weakness, saddle anesthesia, bowel or bladder incontinence.  No history of trauma or injury to the area.  PERTINENT  PMH / PSH: HIV, BMI 29, CABG x3  OBJECTIVE:   There were no vitals taken for this visit.  General: Well-appearing female, no acute distress Hip: Negative Trendelenburg.  No obvious abnormality on inspection.  Patient has pain to palpation directly over greater trochanter bilaterally.  No other bony or muscular abnormalities appreciated.  No pain with palpation to circumferential knee bilaterally.  Patient has full range of motion actively and passively of hips and knees.  She has 5/5 strength with hip flexion and extension, knee flexion and extension. No sensory deficits bilaterally. nonfocal exam. Straight leg test are negative except for tightness in her hamstrings bilaterally.  No pain with axial load to hips bilaterally.   ASSESSMENT/PLAN:   1.  Greater trochanteric bursitis With pain to palpation of greater trochanter directly on both sides, likely that patient has some bursitis.  There is also concern for spinal stenosis given improvement after leaning over on the counter after standing.  For either diagnosis, physical therapy  will be recommended.  The patient does not have any improvement in 4 to 6 weeks, will consider obtaining x-ray and MRI to rule out spinal stenosis.    Melene Plan, MD [facility]

## 2020-01-24 NOTE — Progress Notes (Signed)
I saw and examined the patient with Dr. Selena Batten and agree with assessment and plan as outlined.    Posterior hip and knee pain for a couple months.  Pain when standing or walking long periods, has to sit down or bend forward to get relief.  Denies LBP.  Had negative vascular workup.    Exam reveals tenderness over greater troch bilaterally with tight hamstrings.  Neuro exam nonfocal.  Will try PT.  If no improvement, then x-rays and MRI of lumbar spine to r/o stenosis.

## 2020-01-24 NOTE — Patient Instructions (Signed)
    Diagnosis:  Tendonitis/bursitis of hips with tight hamstring muscles.  Another possibility is lumbar spinal stenosis.   We will treat with physical therapy.  If not improving after a few weeks, send me a message or call and I will order x-rays and MRI scan.

## 2020-01-25 ENCOUNTER — Ambulatory Visit: Payer: Medicare HMO | Attending: Internal Medicine

## 2020-01-25 DIAGNOSIS — Z23 Encounter for immunization: Secondary | ICD-10-CM

## 2020-01-25 NOTE — Progress Notes (Signed)
   Covid-[facility] Vaccination [facility]  Name:  BRUNETTE LAVALLE    MRN: [id number] DOB: 14-Aug-1966  01/25/2020  Ms. Hobbins was observed post Covid-19 immunization for 15 minutes without incident. She was provided with Vaccine Information Sheet and instruction to access the V-Safe system.   Ms. Kuznia was instructed to call 911 with any severe reactions post vaccine: Marland Kitchen Difficulty breathing  . Swelling of face and throat  . A fast heartbeat  . A bad rash all over body  . Dizziness and weakness   Immunizations Administered    Name Date Dose VIS Date Route   Pfizer COVID-19 Vaccine 01/25/2020 10:07 AM 0.3 mL 10/20/2019 Intramuscular   Manufacturer: [employer], [employer]   Lot: [id number]   NDC: [address]-1

## 2020-02-06 ENCOUNTER — Encounter: Payer: Self-pay | Admitting: Physical Therapy

## 2020-02-06 ENCOUNTER — Ambulatory Visit: Payer: Medicare HMO | Admitting: Physical Therapy

## 2020-02-06 ENCOUNTER — Other Ambulatory Visit: Payer: Self-pay

## 2020-02-06 DIAGNOSIS — M25552 Pain in left hip: Secondary | ICD-10-CM | POA: Diagnosis not present

## 2020-02-06 DIAGNOSIS — M25551 Pain in right hip: Secondary | ICD-10-CM

## 2020-02-06 DIAGNOSIS — M545 Low back pain, unspecified: Secondary | ICD-10-CM

## 2020-02-06 DIAGNOSIS — M25561 Pain in right knee: Secondary | ICD-10-CM

## 2020-02-06 NOTE — Therapy (Signed)
[facility] Physical Therapy [address] [place], [place], [address] Phone: [phone]   Fax:  [phone]  Physical Therapy Evaluation  Patient Details  Name: Cathy Smith MRN: [id number] Date of Birth: 09-26-1966 Referring Provider (PT): Hilts, MD   Encounter Date: 02/06/2020  PT End of Session - 02/06/20 1323    Visit Number  1    Number of Visits  12    Date for PT Re-Evaluation  03/19/20    PT Start Time  1145    PT Stop Time  1230    PT Time Calculation (min)  45 min    Activity Tolerance  Patient tolerated treatment well    Behavior During Therapy  [facility] for tasks assessed/performed       Past Medical History:  Diagnosis Date  . Coronary artery disease   . History of condyloma acuminatum   . History of uterine fibroid   . History of vaginal dysplasia    VAIN III--  oncologist-  dr Andrey Farmer  . History of vulvar dysplasia    VIN III   gyn-oncologist-  dr Andrey Farmer  . HIV infection (H[facility]) [facility]-  dr hatcher  . Hypertension   . Hypertriglyceridemia without hypercholesterolemia   . PONV (postoperative nausea and vomiting)   . VIN III (vulvar intraepithelial neoplasia III)   . Wears glasses     Past Surgical History:  Procedure Laterality Date  . ABDOMINAL HYSTERECTOMY  1995 approx  . ANTERIOR CERVICAL DECOMP/DISCECTOMY FUSION  11-30-2009   C4 -- C6  . CO2 LASER APPLICATION Bilateral 07/23/2015   Procedure: BILATERAL CO2 LASER ABLATION OF THE VULVA;  Surgeon: Adolphus Birchwood, MD;  Location: [facility];  Service: Gynecology;  Laterality: Bilateral;  . CO2 LASER APPLICATION N/A 02/25/2016   Procedure: CO2 LASER OF THE VULVAR;  Surgeon: Adolphus Birchwood, MD;  Location: [facility];  Service: Gynecology;  Laterality: N/A;  . CO2 LASER APPLICATION N/A 05/04/2017   Procedure: CO2 LASER VAPORIZATION OF THE VULVA;  Surgeon: Adolphus Birchwood, MD;  Location: [facility];  Service: Gynecology;   Laterality: N/A;  . CO2 LASER APPLICATION N/A 12/27/2018   Procedure: CO2 LASER APPLICATION OF VULVA;  Surgeon: Adolphus Birchwood, MD;  Location: [facility];  Service: Gynecology;  Laterality: N/A;  . COLONOSCOPY  01-28-2015  . CORONARY ARTERY BYPASS GRAFT N/A 11/25/2017   Procedure: CORONARY ARTERY BYPASS GRAFTING (CABG), ON PUMP, TIMES THREE, USING LEFT INTERNAL MAMMARY ARTERY AND ENDOSCOPICALLY HARVESTED LEFT GREATER SAPHENOUS VEIN;  Surgeon: Purcell Nails, MD;  Location: [facility] OR;  Service: Open Heart Surgery;  Laterality: N/A;  . I & D LEFT BUTTOCK ABSCESS  04-28-2001  . LEFT HEART CATH AND CORONARY ANGIOGRAPHY N/A 11/22/2017   Procedure: LEFT HEART CATH AND CORONARY ANGIOGRAPHY;  Surgeon: Runell Gess, MD;  [facility];  Service: Cardiovascular;  Laterality: N/A;  . TEE WITHOUT CARDIOVERSION N/A 11/25/2017   Procedure: TRANSESOPHAGEAL ECHOCARDIOGRAM (TEE);  Surgeon: Purcell Nails, MD;  Location: [facility] OR;  Service: Open Heart Surgery;  Laterality: N/A;  . VULVECTOMY Right 05/06/2015   Procedure: WIDE LOCAL  EXCISION  OF RIGHT VULVA;  Surgeon: Adolphus Birchwood, MD;  Location: [facility];  Service: Gynecology;  Laterality: Right;  Marland Kitchen VULVECTOMY N/A 05/04/2017   Procedure: WIDE EXCISION VULVECTOMY;  Surgeon: Adolphus Birchwood, MD;  Location: [facility];  Service: Gynecology;  Laterality: N/A;  . VULVECTOMY N/A 12/27/2018   Procedure: WIDE  EXCISION VULVECTOMY;  Surgeon: Everitt Amber, MD;  Location: [facility];  Service: Gynecology;  Laterality: N/A;    There were no vitals filed for this visit.   Subjective Assessment - 02/06/20 1148    Subjective  her pain started a month ago in her Rt leg but occasional pain in her left leg, but does note that she had pain in late fall that she was seen at [facility]. Patient reports pain is located mostly on right rip area and aching feeling spreads down her lower leg towards her knee  into her calf. She reports that it hurts worse when she is standing up and typically washes dishes while sitting on a barstool or will have to lean over the counter to make her legs feel better.  She denies any weakness, saddle anesthesia, bowel or bladder incontinence.  She denies any feelings on numbnes or tingling or burning in her leg. No history of trauma or injury to the area.    Pertinent History  PMH: CAD,CABG,HIV,cerv fusion 2011    Limitations  House hold activities;Lifting;Standing;Walking    How long can you sit comfortably?  not limited    How long can you stand comfortably?  few minutes    How long can you walk comfortably?  down her driveway and back    Diagnostic tests  no recent imaging    Currently in Pain?  Yes    Pain Score  9     Pain Location  Hip    Pain Orientation  Right    Pain Descriptors / Indicators  Aching    Pain Type  Acute pain    Pain Radiating Towards  down her Rt leg into her calf    Pain Onset  More than a month ago    Pain Frequency  Intermittent    Aggravating Factors   standing, walking,    Pain Relieving Factors  leaning on something, sitting, heat    Effect of Pain on Daily Activities  limites standing ADL's         [facility] PT Assessment - 02/06/20 0001      Assessment   Medical Diagnosis  bilat leg pain, low back pain    Referring Provider (PT)  Hilts, MD    Onset Date/Surgical Date  --   1-2 month onset of pain   Hand Dominance  Right    Next MD Visit  nothing scheduled    Prior Therapy  none      Precautions   Precautions  None      Restrictions   Weight Bearing Restrictions  No      Balance Screen   Has the patient fallen in the past 6 months  No      [place] residence    Additional Comments  single level home      Prior Function   Level of Independence  Independent    Vocation  On disability    Leisure  reading      Cognition   Overall Cognitive Status  Within Functional Limits for  tasks assessed      Sensation   Light Touch  Appears Intact      Coordination   Gross Motor Movements are Fluid and Coordinated  Yes      Posture/Postural Control   Posture Comments  slumped posture      ROM / Strength   AROM / PROM / Strength  AROM;Strength      AROM   AROM Assessment Site  Lumbar    Lumbar Flexion  75%    Lumbar Extension  50%   very painful   Lumbar - Right Side Bend  50%   mild pain   Lumbar - Left Side Bend  25%   very painful   Lumbar - Right Rotation  50%    Lumbar - Left Rotation  50%      Strength   Overall Strength Comments  bilat hip flexion 4/5, hip abd 4/5, knee strength 5/5, ankle DF 4/5, ankle PF 5/5      Flexibility   Soft Tissue Assessment /Muscle Length  --   tight glutes, lumbar P.S, hamstrings     Palpation   Spinal mobility  WFL    Palpation comment  TTP over greater trochanter      Special Tests   Other special tests  neg slump test, neg SLR, some relief with repeated flexion stretching,      Transfers   Transfers  Independent with all Transfers                Objective measurements completed on examination: See above findings.      [facility] Adult PT Treatment/Exercise - 02/06/20 0001      Modalities   Modalities  Moist Heat      Moist Heat Therapy   Number Minutes Moist Heat  10 Minutes    Moist Heat Location  Hip   pt in Lt sidelying     Manual Therapy   Manual therapy comments  PROM for [facility], long axis distraction             PT Education - 02/06/20 1323    Education Details  HEP, POC    Person(s) Educated  Patient    Methods  Explanation;Demonstration;Verbal cues;Handout    Comprehension  Verbalized understanding;Need further instruction          PT Long Term Goals - 02/06/20 1339      PT LONG TERM GOAL #1   Title  Pt will be I and compliant with HEP. Target for all goals 6 weeks 03/19/20    Status  New      PT LONG TERM GOAL #2   Title  Pt will reduce overall pain in her hips/legs  to less than 4/10 on avg    Baseline  9    Status  New      PT LONG TERM GOAL #3   Title  Pt will improve bilat hip strength to at least 4+/5 in sitting    Baseline  4/5    Status  New      PT LONG TERM GOAL #4   Title  Pt will be able to stand for at least 30 minutes to perform ADLs    Baseline  can only stand 5 minutes    Status  New             Plan - 02/06/20 1328    Clinical Impression Statement  Pt presents with bilat leg pain Rt>Lt, Rt hip bursitis/greater trochanter pain syndrome, and possible lumbar stenosis.She has referred pain down her Rt leg into her calf but does not describe this as radiculopathy. She does not have significant weakness in Rt leg compared to Lt but does have decreased bilat hip strength. She did have direction preference to flexion based stretching today. She has overall decresaed lumbar ROM, and increased pain with standing or  walking activiites. She will benefit from skilled PT to address her deficits.    Personal Factors and Comorbidities  Comorbidity 3+    Comorbidities  PMH: CAD,CABG,HIV,cerv fusion 2011    Examination-Activity Limitations  Carry;Squat;Stairs;Lift;Stand    Examination-Participation Restrictions  Community Activity;Laundry;Meal Prep    Stability/Clinical Decision Making  Evolving/Moderate complexity    Clinical Decision Making  Moderate    Rehab Potential  Good    PT Frequency  2x / week    PT Duration  6 weeks    PT Treatment/Interventions  ADLs/Self Care Home Management;Aquatic Therapy;Cryotherapy;Electrical Stimulation;Iontophoresis 4mg /ml Dexamethasone;Moist Heat;Traction;Ultrasound;Gait training;Therapeutic activities;Therapeutic exercise;Neuromuscular re-education;Patient/family education;Manual techniques;Passive range of motion;Dry needling;Joint Manipulations;Spinal Manipulations;Taping    PT Next Visit Plan  review and update HEP, appeared to prefer flexion, consider traction or modalaties    PT Home Exercise Plan   Access Code: [email or web address]    Consulted and Agree with Plan of Care  Patient       Patient will benefit from skilled therapeutic intervention in order to improve the following deficits and impairments:  Abnormal gait, Decreased activity tolerance, Decreased endurance, Decreased range of motion, Decreased strength, Difficulty walking, Increased muscle spasms, Impaired flexibility, Pain  Visit Diagnosis: Pain in right hip  Acute low back pain, unspecified back pain laterality, unspecified whether sciatica present  Pain in left hip  Acute pain of right knee     Problem List Patient Active Problem List   Diagnosis Date Noted  . Prediabetes 12/03/2019  . Chronic pain of lower extremity 12/03/2019  . Neuropathy 08/17/2019  . Suspected deep vein thrombosis (DVT) 04/09/2019  . AIN grade III 01/18/2019  . Left flank pain 12/14/2018  . Left leg pain 12/14/2018  . 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 LGSIL Pap smear of vagina 04/28/2017  . 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 Sinusitis 12/30/2015  . History of fusion of cervical spine 08/27/2015  . 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 Physical exam, annual 09/06/2014  . Alopecia of scalp 09/06/2014  . Hypertriglyceridemia without hypercholesterolemia 08/08/2014  . Dysuria 07/13/2012  . Essential hypertension 06/12/2009  . MONILIASIS, ORAL 09/24/2008  . HX, PERSONAL, PAST NONCOMPLIANCE 11/24/2006  . Human immunodeficiency virus (HIV) disease (HCC) 09/15/2000  . HYSTERECTOMY, HX OF 11/10/1983    01/08/1984, PT,DPT 02/06/2020, 1:50 PM  [facility] Physical Therapy [address] [place], [place], [place] Phone: [phone]   Fax:  [phone]  Name: Cathy Smith MRN: [name] Date of Birth: 11-03-1966

## 2020-02-06 NOTE — Patient Instructions (Signed)
Access Code: [email or web address]: [email or web address]: 03/30/2021Prepared by: Arlys John NelsonExercises  Seated Lumbar Flexion Stretch - 2 x daily - 6 x weekly - 10-20 reps - 1 sets - 5 hold  Seated Hamstring Stretch - 2 x daily - 6 x weekly - 2 reps - 1 sets - 30 hold  Supine Single Knee to Chest - 2 x daily - 6 x weekly - 3 sets - 30 hold  Supine Piriformis Stretch with Foot on Ground - 2 x daily - 6 x weekly - 3 sets - 30 hold  Supine March - 2 x daily - 6 x weekly - 10 reps - 1-2 sets  Clamshell - 2 x daily - 6 x weekly - 10 reps - 3 sets

## 2020-02-07 ENCOUNTER — Ambulatory Visit (INDEPENDENT_AMBULATORY_CARE_PROVIDER_SITE_OTHER): Payer: Medicare HMO | Admitting: Physical Therapy

## 2020-02-07 DIAGNOSIS — M25561 Pain in right knee: Secondary | ICD-10-CM

## 2020-02-07 DIAGNOSIS — M25551 Pain in right hip: Secondary | ICD-10-CM

## 2020-02-07 DIAGNOSIS — M25552 Pain in left hip: Secondary | ICD-10-CM

## 2020-02-07 DIAGNOSIS — M545 Low back pain, unspecified: Secondary | ICD-10-CM

## 2020-02-07 NOTE — Therapy (Signed)
[facility] Physical Therapy [address] [place], [place], [address] Phone: [phone]   Fax:  [phone]  Physical Therapy Treatment  Patient Details  Name: Cathy Smith MRN: [id number] Date of Birth: 1966-02-09 Referring Provider (PT): Hilts, MD   Encounter Date: 02/07/2020  PT End of Session - 02/07/20 1139    Visit Number  2    Number of Visits  12    Date for PT Re-Evaluation  03/19/20    PT Start Time  1110   pt arrives 10  min late   PT Stop Time  1200   last 10 min on heat   PT Time Calculation (min)  50 min    Activity Tolerance  Patient tolerated treatment well    Behavior During Therapy  [facility] for tasks assessed/performed       Past Medical History:  Diagnosis Date  . Coronary artery disease   . History of condyloma acuminatum   . History of uterine fibroid   . History of vaginal dysplasia    VAIN III--  oncologist-  dr Andrey Farmer  . History of vulvar dysplasia    VIN III   gyn-oncologist-  dr Andrey Farmer  . HIV infection (HCC) [facility]-  dr hatcher  . Hypertension   . Hypertriglyceridemia without hypercholesterolemia   . PONV (postoperative nausea and vomiting)   . VIN III (vulvar intraepithelial neoplasia III)   . Wears glasses     Past Surgical History:  Procedure Laterality Date  . ABDOMINAL HYSTERECTOMY  1995 approx  . ANTERIOR CERVICAL DECOMP/DISCECTOMY FUSION  11-30-2009   C4 -- C6  . CO2 LASER APPLICATION Bilateral 07/23/2015   Procedure: BILATERAL CO2 LASER ABLATION OF THE VULVA;  Surgeon: Adolphus Birchwood, MD;  Location: [facility] [place];  Service: Gynecology;  Laterality: Bilateral;  . CO2 LASER APPLICATION N/A 02/25/2016   Procedure: CO2 LASER OF THE VULVAR;  Surgeon: Adolphus Birchwood, MD;  Location: [facility];  Service: Gynecology;  Laterality: N/A;  . CO2 LASER APPLICATION N/A 05/04/2017   Procedure: CO2 LASER VAPORIZATION OF THE VULVA;  Surgeon: Adolphus Birchwood, MD;  [facility];  Service: Gynecology;  Laterality: N/A;  . CO2 LASER APPLICATION N/A 12/27/2018   Procedure: CO2 LASER APPLICATION OF VULVA;  Surgeon: Adolphus Birchwood, MD;  Location: [facility] [place];  Service: Gynecology;  Laterality: N/A;  . COLONOSCOPY  01-28-2015  . CORONARY ARTERY BYPASS GRAFT N/A 11/25/2017   Procedure: CORONARY ARTERY BYPASS GRAFTING (CABG), ON PUMP, TIMES THREE, USING LEFT INTERNAL MAMMARY ARTERY AND ENDOSCOPICALLY HARVESTED LEFT GREATER SAPHENOUS VEIN;  Surgeon: Purcell Nails, MD;  Location: [facility] OR;  Service: Open Heart Surgery;  Laterality: N/A;  . I & D LEFT BUTTOCK ABSCESS  04-28-2001  . LEFT HEART CATH AND CORONARY ANGIOGRAPHY N/A 11/22/2017   Procedure: LEFT HEART CATH AND CORONARY ANGIOGRAPHY;  Surgeon: Runell Gess, MD;  [facility];  Service: Cardiovascular;  Laterality: N/A;  . TEE WITHOUT CARDIOVERSION N/A 11/25/2017   Procedure: TRANSESOPHAGEAL ECHOCARDIOGRAM (TEE);  Surgeon: Purcell Nails, MD;  Location[facility] OR;  Service: Open Heart Surgery;  Laterality: N/A;  . VULVECTOMY Right 05/06/2015   Procedure: WIDE LOCAL  EXCISION  OF RIGHT VULVA;  Surgeon: Adolphus Birchwood, MD;  Location: [facility];  Service: Gynecology;  Laterality: Right;  Marland Kitchen VULVECTOMY N/A 05/04/2017   Procedure: WIDE EXCISION VULVECTOMY;  Surgeon: Adolphus Birchwood, MD;  Location: [facility];  Service:  Gynecology;  Laterality: N/A;  . VULVECTOMY N/A 12/27/2018   Procedure: WIDE EXCISION VULVECTOMY;  Surgeon: Adolphus Birchwood, MD;  Location: [facility];  Service: Gynecology;  Laterality: N/A;    There were no vitals filed for this visit.  Subjective Assessment - 02/07/20 1123    Subjective  relays she has tried some of the exercises and they might be helping some.    Pertinent History  PMH: CAD,CABG,HIV,cerv fusion 2011    Limitations  House hold activities;Lifting;Standing;Walking    How long can you sit comfortably?  not limited     How long can you stand comfortably?  few minutes    How long can you walk comfortably?  down her driveway and back    Diagnostic tests  no recent imaging    Pain Score  9     Pain Location  Hip    Pain Orientation  Right    Pain Descriptors / Indicators  Aching    Pain Onset  More than a month ago                       [facility] Adult PT Treatment/Exercise - 02/07/20 0001      Exercises   Exercises  Lumbar      Lumbar Exercises: Stretches   Active Hamstring Stretch  Right;Left;2 reps;30 seconds    Single Knee to Chest Stretch  Right;Left;2 reps;30 seconds    Double Knee to Chest Stretch Limitations  with ball 20 sec X 4 reps    Gastroc Stretch  3 reps;30 seconds;Right;Left    Gastroc Stretch Limitations  standing at wall    Other Lumbar Stretch Exercise  seated flexion P ball roll outs 10 sec X 10      Lumbar Exercises: Aerobic   Recumbent Bike  6 min L1      Moist Heat Therapy   Number Minutes Moist Heat  10 Minutes    Moist Heat Location  Hip      Manual Therapy   Manual therapy comments  PROM for [facility], long axis distraction             PT Education - 02/07/20 1139    Education Details  HEP review    Person(s) Educated  Patient    Methods  Explanation;Demonstration;Verbal cues    Comprehension  Verbalized understanding;Returned demonstration          PT Long Term Goals - 02/06/20 1339      PT LONG TERM GOAL #1   Title  Pt will be I and compliant with HEP. Target for all goals 6 weeks 03/19/20    Status  New      PT LONG TERM GOAL #2   Title  Pt will reduce overall pain in her hips/legs to less than 4/10 on avg    Baseline  9    Status  New      PT LONG TERM GOAL #3   Title  Pt will improve bilat hip strength to at least 4+/5 in sitting    Baseline  4/5    Status  New      PT LONG TERM GOAL #4   Title  Pt will be able to stand for at least 30 minutes to perform ADLs    Baseline  can only stand 5 minutes    Status  New             Plan - 02/07/20 1141    Clinical  Impression Statement  Session focused on HEP review and she was able to return demonstate after cuing. She will likely need further review in future to ensure proper technique.    Personal Factors and Comorbidities  Comorbidity 3+    Comorbidities  PMH: CAD,CABG,HIV,cerv fusion 2011    Examination-Activity Limitations  Carry;Squat;Stairs;Lift;Stand    Stability/Clinical Decision Making  Evolving/Moderate complexity    Rehab Potential  Good    PT Frequency  2x / week    PT Duration  6 weeks    PT Treatment/Interventions  ADLs/Self Care Home Management;Aquatic Therapy;Cryotherapy;Electrical Stimulation;Iontophoresis 4mg /ml Dexamethasone;Moist Heat;Traction;Ultrasound;Gait training;Therapeutic activities;Therapeutic exercise;Neuromuscular re-education;Patient/family education;Manual techniques;Passive range of motion;Dry needling;Joint Manipulations;Spinal Manipulations;Taping    PT Next Visit Plan  review and update HEP, appeared to prefer flexion, consider traction or modalaties    PT Home Exercise Plan  Access Code: [email or web address]       Patient will benefit from skilled therapeutic intervention in order to improve the following deficits and impairments:  Abnormal gait, Decreased activity tolerance, Decreased endurance, Decreased range of motion, Decreased strength, Difficulty walking, Increased muscle spasms, Impaired flexibility, Pain  Visit Diagnosis: Pain in right hip  Acute low back pain, unspecified back pain laterality, unspecified whether sciatica present  Pain in left hip  Acute pain of right knee     Problem List Patient Active Problem List   Diagnosis Date Noted  . Prediabetes 12/03/2019  . Chronic pain of lower extremity 12/03/2019  . Neuropathy 08/17/2019  . Suspected deep vein thrombosis (DVT) 04/09/2019  . AIN grade III 01/18/2019  . Left flank pain 12/14/2018  . Left leg pain 12/14/2018  . 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  LGSIL Pap smear of vagina 04/28/2017  . 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 Sinusitis 12/30/2015  . History of fusion of cervical spine 08/27/2015  . 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 Physical exam, annual 09/06/2014  . Alopecia of scalp 09/06/2014  . Hypertriglyceridemia without hypercholesterolemia 08/08/2014  . Dysuria 07/13/2012  . Essential hypertension 06/12/2009  . MONILIASIS, ORAL 09/24/2008  . HX, PERSONAL, PAST NONCOMPLIANCE 11/24/2006  . Human immunodeficiency virus (HIV) disease ([place]) 09/15/2000  . HYSTERECTOMY, HX OF 11/10/1983    Debbe Odea, PT,DPT 02/07/2020, 11:55 AM  [facility] Physical Therapy [address] [place], [place], [address] Phone: [phone]   Fax:  [phone]  Name: Cathy Smith MRN: [id number] Date of Birth: October 18, 1966

## 2020-02-14 ENCOUNTER — Ambulatory Visit: Payer: Medicare HMO | Admitting: Family Medicine

## 2020-02-15 ENCOUNTER — Ambulatory Visit: Payer: Medicare HMO | Admitting: Physical Therapy

## 2020-02-15 ENCOUNTER — Encounter: Payer: Self-pay | Admitting: Physical Therapy

## 2020-02-15 ENCOUNTER — Other Ambulatory Visit: Payer: Self-pay

## 2020-02-15 DIAGNOSIS — M25561 Pain in right knee: Secondary | ICD-10-CM | POA: Diagnosis not present

## 2020-02-15 DIAGNOSIS — M545 Low back pain, unspecified: Secondary | ICD-10-CM

## 2020-02-15 DIAGNOSIS — M25552 Pain in left hip: Secondary | ICD-10-CM

## 2020-02-15 DIAGNOSIS — M25551 Pain in right hip: Secondary | ICD-10-CM

## 2020-02-15 NOTE — Therapy (Signed)
[facility] Physical Therapy [address] [place], [place], [address] Phone: [phone]   Fax:  [phone]  Physical Therapy Treatment  Patient Details  Name: Cathy Smith MRN: [id number] Date of Birth: 04-Jan-1966 Referring Provider (PT): Hilts, MD   Encounter Date: 02/15/2020  PT End of Session - 02/15/20 1153    Visit Number  3    Number of Visits  12    Date for PT Re-Evaluation  03/19/20    PT Start Time  1020    PT Stop Time  1110    PT Time Calculation (min)  50 min    Activity Tolerance  Patient tolerated treatment well    Behavior During Therapy  [facility] for tasks assessed/performed       Past Medical History:  Diagnosis Date  . Coronary artery disease   . History of condyloma acuminatum   . History of uterine fibroid   . History of vaginal dysplasia    VAIN III--  oncologist-  dr Denman George  . History of vulvar dysplasia    VIN III   gyn-oncologist-  dr Denman George  . HIV infection ([place]) [facility]-  dr hatcher  . Hypertension   . Hypertriglyceridemia without hypercholesterolemia   . PONV (postoperative nausea and vomiting)   . VIN III (vulvar intraepithelial neoplasia III)   . Wears glasses     Past Surgical History:  Procedure Laterality Date  . ABDOMINAL HYSTERECTOMY  1995 approx  . ANTERIOR CERVICAL DECOMP/DISCECTOMY FUSION  11-30-2009   C4 -- C6  . CO2 LASER APPLICATION Bilateral 9/89/2119   Procedure: BILATERAL CO2 LASER ABLATION OF THE VULVA;  Surgeon: Everitt Amber, MD;  Location: [place];  Service: Gynecology;  Laterality: Bilateral;  . CO2 LASER APPLICATION N/A 02/24/4080   Procedure: CO2 LASER OF THE VULVAR;  Surgeon: Everitt Amber, MD;  Location: [place];  Service: Gynecology;  Laterality: N/A;  . CO2 LASER APPLICATION N/A 4/48/1856   Procedure: CO2 LASER VAPORIZATION OF THE VULVA;  Surgeon: Everitt Amber, MD;  Location: [place];  Service: Gynecology;  Laterality:  N/A;  . CO2 LASER APPLICATION N/A 01/20/9701   Procedure: CO2 LASER APPLICATION OF VULVA;  Surgeon: Everitt Amber, MD;  Location: [place];  Service: Gynecology;  Laterality: N/A;  . COLONOSCOPY  01-28-2015  . CORONARY ARTERY BYPASS GRAFT N/A 11/25/2017   Procedure: CORONARY ARTERY BYPASS GRAFTING (CABG), ON PUMP, TIMES THREE, USING LEFT INTERNAL MAMMARY ARTERY AND ENDOSCOPICALLY HARVESTED LEFT GREATER SAPHENOUS VEIN;  Surgeon: Rexene Alberts, MD;  Location: [place];  Service: Open Heart Surgery;  Laterality: N/A;  . I & D LEFT BUTTOCK ABSCESS  04-28-2001  . LEFT HEART CATH AND CORONARY ANGIOGRAPHY N/A 11/22/2017   Procedure: LEFT HEART CATH AND CORONARY ANGIOGRAPHY;  Surgeon: Lorretta Harp, MD;  Location: [place] [facility];  Service: Cardiovascular;  Laterality: N/A;  . TEE WITHOUT CARDIOVERSION N/A 11/25/2017   Procedure: TRANSESOPHAGEAL ECHOCARDIOGRAM (TEE);  Surgeon: Rexene Alberts, MD;  Location: [place];  Service: Open Heart Surgery;  Laterality: N/A;  . VULVECTOMY Right 05/06/2015   Procedure: WIDE LOCAL  EXCISION  OF RIGHT VULVA;  Surgeon: Everitt Amber, MD;  Location: [place];  Service: Gynecology;  Laterality: Right;  Marland Kitchen VULVECTOMY N/A 05/04/2017   Procedure: WIDE EXCISION VULVECTOMY;  Surgeon: Everitt Amber, MD;  Location: [facility]  Service: Gynecology;  Laterality: N/A;  . VULVECTOMY N/A 12/27/2018   Procedure: WIDE  EXCISION VULVECTOMY;  Surgeon: Adolphus Birchwood, MD;  Location: [facility];  Service: Gynecology;  Laterality: N/A;    There were no vitals filed for this visit.  Subjective Assessment - 02/15/20 1029    Subjective  relays the pain is getting a little better and the exercises are helping however she still rates 9/10 pain level when asked    Pertinent History  PMH: CAD,CABG,HIV,cerv fusion 2011    Limitations  House hold activities;Lifting;Standing;Walking    How long can you sit comfortably?  not limited    How  long can you stand comfortably?  few minutes    How long can you walk comfortably?  down her driveway and back    Diagnostic tests  no recent imaging    Pain Onset  More than a month ago                       [facility] Adult PT Treatment/Exercise - 02/15/20 0001      Lumbar Exercises: Stretches   Active Hamstring Stretch  Right;Left;2 reps;30 seconds    Active Hamstring Stretch Limitations  seated    Single Knee to Chest Stretch  Right;Left;2 reps;30 seconds    Gastroc Stretch  3 reps;30 seconds;Right;Left    Gastroc Stretch Limitations  slantboard    Other Lumbar Stretch Exercise  seated flexion stool roll outs 10 sec X 5, 3 way      Lumbar Exercises: Aerobic   Nustep  L7 X 8 min LE/UE      Lumbar Exercises: Standing   Other Standing Lumbar Exercises  hip abd, marches, ext, H.S curls with 3 lb weight X 15 reps bilat      Lumbar Exercises: Seated   Long Arc Quad on Chair  Both;20 reps    LAQ on Chair Weights (lbs)  3      Lumbar Exercises: Supine   Clam  20 reps    Clam Limitations  green      Lumbar Exercises: Sidelying   Hip Abduction  Both;15 reps      Moist Heat Therapy   Number Minutes Moist Heat  10 Minutes    Moist Heat Location  Hip                  PT Long Term Goals - 02/06/20 1339      PT LONG TERM GOAL #1   Title  Pt will be I and compliant with HEP. Target for all goals 6 weeks 03/19/20    Status  New      PT LONG TERM GOAL #2   Title  Pt will reduce overall pain in her hips/legs to less than 4/10 on avg    Baseline  9    Status  New      PT LONG TERM GOAL #3   Title  Pt will improve bilat hip strength to at least 4+/5 in sitting    Baseline  4/5    Status  New      PT LONG TERM GOAL #4   Title  Pt will be able to stand for at least 30 minutes to perform ADLs    Baseline  can only stand 5 minutes    Status  New            Plan - 02/15/20 1153    Clinical Impression Statement  Progressed her strength program with  good tolerance and without complaints, progressed her HEP to include new exercises  which was reviewed with her and she reeived new print out. She is wanting to return to her walking program so provided education to only walk 50% of what she was doing and if pain does not go over 2 levels then she can progress her distance/time 10% each week.    Personal Factors and Comorbidities  Comorbidity 3+    Comorbidities  PMH: CAD,CABG,HIV,cerv fusion 2011    Examination-Activity Limitations  Carry;Squat;Stairs;Lift;Stand    Stability/Clinical Decision Making  Evolving/Moderate complexity    Rehab Potential  Good    PT Frequency  2x / week    PT Duration  6 weeks    PT Treatment/Interventions  ADLs/Self Care Home Management;Aquatic Therapy;Cryotherapy;Electrical Stimulation;Iontophoresis 4mg /ml Dexamethasone;Moist Heat;Traction;Ultrasound;Gait training;Therapeutic activities;Therapeutic exercise;Neuromuscular re-education;Patient/family education;Manual techniques;Passive range of motion;Dry needling;Joint Manipulations;Spinal Manipulations;Taping    PT Next Visit Plan  review and update HEP, appeared to prefer flexion, consider traction or modalaties    PT Home Exercise Plan  [email or web address]    Consulted and Agree with Plan of Care  Patient       Patient will benefit from skilled therapeutic intervention in order to improve the following deficits and impairments:  Abnormal gait, Decreased activity tolerance, Decreased endurance, Decreased range of motion, Decreased strength, Difficulty walking, Increased muscle spasms, Impaired flexibility, Pain  Visit Diagnosis: Pain in right hip  Acute low back pain, unspecified back pain laterality, unspecified whether sciatica present  Pain in left hip  Acute pain of right knee     Problem List Patient Active Problem List   Diagnosis Date Noted  . Prediabetes 12/03/2019  . Chronic pain of lower extremity 12/03/2019  . Neuropathy 08/17/2019  . Suspected deep  vein thrombosis (DVT) 04/09/2019  . AIN grade III 01/18/2019  . Left flank pain 12/14/2018  . Left leg pain 12/14/2018  . 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 LGSIL Pap smear of vagina 04/28/2017  . 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 Sinusitis 12/30/2015  . History of fusion of cervical spine 08/27/2015  . 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 Physical exam, annual 09/06/2014  . Alopecia of scalp 09/06/2014  . Hypertriglyceridemia without hypercholesterolemia 08/08/2014  . Dysuria 07/13/2012  . Essential hypertension 06/12/2009  . MONILIASIS, ORAL 09/24/2008  . HX, PERSONAL, PAST NONCOMPLIANCE 11/24/2006  . Human immunodeficiency virus (HIV) disease (HCC) 09/15/2000  . HYSTERECTOMY, HX OF 11/10/1983    01/08/1984 02/15/2020, 12:05 PM  [facility] Physical Therapy [address] [place], [place], [place] Phone: [phone]   Fax:  [phone]  Name: Cathy Smith MRN: [name] Date of Birth: 1966/04/26

## 2020-02-15 NOTE — Patient Instructions (Signed)
Access Code: [email or web address]: [email or web address]: 04/08/2021Prepared by: Arlys John NelsonExercises  Seated Lumbar Flexion Stretch - 2 x daily - 6 x weekly - 10 reps - 1 sets - 10 hold  Seated Hamstring Stretch - 2 x daily - 6 x weekly - 1 sets - 2 reps - 30 hold  Standing Gastroc Stretch - 2 x daily - 6 x weekly - 1 sets - 2 reps - 30 hold  Hooklying Single Knee to Chest Stretch - 2 x daily - 6 x weekly - 1 sets - 2 reps - 30 hold  Hooklying Clamshell with Resistance - 2 x daily - 6 x weekly - 1-2 sets - 20 reps  Sidelying Hip Abduction - 2 x daily - 6 x weekly - 1-2 sets - 10 reps  Marching with Resistance - 2 x daily - 6 x weekly - 2-3 sets - 10 reps  Hip Abduction with Resistance Loop - 2 x daily - 6 x weekly - 10 reps - 1-2 sets  Hip Extension with Resistance Loop - 2 x daily - 6 x weekly - 10 reps - 1-2 sets  Standing Hamstring Curl with Resistance - 2 x daily - 6 x weekly - 10 reps - 1-2 sets

## 2020-02-19 ENCOUNTER — Other Ambulatory Visit: Payer: Medicare HMO

## 2020-02-19 ENCOUNTER — Other Ambulatory Visit: Payer: Self-pay

## 2020-02-19 ENCOUNTER — Ambulatory Visit: Payer: Medicare HMO | Attending: Internal Medicine

## 2020-02-19 DIAGNOSIS — R748 Abnormal levels of other serum enzymes: Secondary | ICD-10-CM | POA: Diagnosis not present

## 2020-02-19 DIAGNOSIS — Z23 Encounter for immunization: Secondary | ICD-10-CM

## 2020-02-19 DIAGNOSIS — B20 Human immunodeficiency virus [HIV] disease: Secondary | ICD-10-CM

## 2020-02-19 DIAGNOSIS — I10 Essential (primary) hypertension: Secondary | ICD-10-CM | POA: Diagnosis not present

## 2020-02-20 ENCOUNTER — Encounter: Payer: Medicare HMO | Admitting: Physical Therapy

## 2020-02-20 LAB — T-HELPER CELL (CD4) - (RCID CLINIC ONLY)
CD4 % Helper T Cell: 23 % — ABNORMAL LOW (ref 33–65)
CD4 T Cell Abs: 411 /uL (ref 400–1790)

## 2020-02-22 ENCOUNTER — Ambulatory Visit: Payer: Medicare HMO | Admitting: Physical Therapy

## 2020-02-22 ENCOUNTER — Encounter: Payer: Self-pay | Admitting: Physical Therapy

## 2020-02-22 ENCOUNTER — Other Ambulatory Visit: Payer: Self-pay

## 2020-02-22 DIAGNOSIS — M545 Low back pain, unspecified: Secondary | ICD-10-CM

## 2020-02-22 DIAGNOSIS — M25551 Pain in right hip: Secondary | ICD-10-CM | POA: Diagnosis not present

## 2020-02-22 DIAGNOSIS — M25561 Pain in right knee: Secondary | ICD-10-CM | POA: Diagnosis not present

## 2020-02-22 DIAGNOSIS — M25552 Pain in left hip: Secondary | ICD-10-CM

## 2020-02-22 NOTE — Therapy (Signed)
[facility] Physical Therapy [address] [place], [place], [address] Phone: [phone]   Fax:  [phone]  Physical Therapy Treatment  Patient Details  Name: Cathy Smith MRN: [id number] Date of Birth: [birth year] Referring Provider (PT): Hilts, MD   Encounter Date: 02/22/2020  PT End of Session - 02/22/20 1205    Visit Number  4    Number of Visits  12    Date for PT Re-Evaluation  03/19/20    PT Start Time  1100    PT Stop Time  1145    PT Time Calculation (min)  45 min    Activity Tolerance  Patient tolerated treatment well    Behavior During Therapy  [facility] for tasks assessed/performed       Past Medical History:  Diagnosis Date  . Coronary artery disease   . History of condyloma acuminatum   . History of uterine fibroid   . History of vaginal dysplasia    VAIN III--  oncologist-  dr Andrey Farmer  . History of vulvar dysplasia    VIN III   gyn-oncologist-  dr Andrey Farmer  . HIV infection ([facility]) [facility]-  dr hatcher  . Hypertension   . Hypertriglyceridemia without hypercholesterolemia   . PONV (postoperative nausea and vomiting)   . VIN III (vulvar intraepithelial neoplasia III)   . Wears glasses     Past Surgical History:  Procedure Laterality Date  . ABDOMINAL HYSTERECTOMY  1995 approx  . ANTERIOR CERVICAL DECOMP/DISCECTOMY FUSION  11-30-2009   C4 -- C6  . CO2 LASER APPLICATION Bilateral 07/23/2015   Procedure: BILATERAL CO2 LASER ABLATION OF THE VULVA;  Surgeon: Adolphus Birchwood, MD;  Location: [facility];  Service: Gynecology;  Laterality: Bilateral;  . CO2 LASER APPLICATION N/A 02/25/2016   Procedure: CO2 LASER OF THE VULVAR;  Surgeon: Adolphus Birchwood, MD;  Location: [facility];  Service: Gynecology;  Laterality: N/A;  . CO2 LASER APPLICATION N/A 05/04/2017   Procedure: CO2 LASER VAPORIZATION OF THE VULVA;  Surgeon: Adolphus Birchwood, MD;  Location: [facility];  Service: Gynecology;   Laterality: N/A;  . CO2 LASER APPLICATION N/A 12/27/2018   Procedure: CO2 LASER APPLICATION OF VULVA;  Surgeon: Adolphus Birchwood, MD;  Location: [facility];  Service: Gynecology;  Laterality: N/A;  . COLONOSCOPY  01-28-2015  . CORONARY ARTERY BYPASS GRAFT N/A 11/25/2017   Procedure: CORONARY ARTERY BYPASS GRAFTING (CABG), ON PUMP, TIMES THREE, USING LEFT INTERNAL MAMMARY ARTERY AND ENDOSCOPICALLY HARVESTED LEFT GREATER SAPHENOUS VEIN;  Surgeon: Purcell Nails, MD;  Location: [facility];  Service: Open Heart Surgery;  Laterality: N/A;  . I & D LEFT BUTTOCK ABSCESS  04-28-2001  . LEFT HEART CATH AND CORONARY ANGIOGRAPHY N/A 11/22/2017   Procedure: LEFT HEART CATH AND CORONARY ANGIOGRAPHY;  Surgeon: Runell Gess, MD;  [facility]: [facility];  Service: Cardiovascular;  Laterality: N/A;  . TEE WITHOUT CARDIOVERSION N/A 11/25/2017   Procedure: TRANSESOPHAGEAL ECHOCARDIOGRAM (TEE);  Surgeon: Purcell Nails, MD;  Location: [facility];  Service: Open Heart Surgery;  Laterality: N/A;  . VULVECTOMY Right 05/06/2015   Procedure: WIDE LOCAL  EXCISION  OF RIGHT VULVA;  Surgeon: Adolphus Birchwood, MD;  Location: [facility];  Service: Gynecology;  Laterality: Right;  Marland Kitchen VULVECTOMY N/A 05/04/2017   Procedure: WIDE EXCISION VULVECTOMY;  Surgeon: Adolphus Birchwood, MD;  Location: [facility];  Service: Gynecology;  Laterality: N/A;  . VULVECTOMY N/A 12/27/2018   Procedure: WIDE  EXCISION VULVECTOMY;  Surgeon: Everitt Amber, MD;  Location: [facility];  Service: Gynecology;  Laterality: N/A;    There were no vitals filed for this visit.  Subjective Assessment - 02/22/20 1115    Subjective  relays compliance with HEP, not too much pain today    Pertinent History  PMH: CAD,CABG,HIV,cerv fusion 2011    Limitations  House hold activities;Lifting;Standing;Walking    How long can you sit comfortably?  not limited    How long can you stand comfortably?  few minutes    How long  can you walk comfortably?  down her driveway and back    Diagnostic tests  no recent imaging    Pain Onset  More than a month ago                       [facility] Adult PT Treatment/Exercise - 02/22/20 0001      Lumbar Exercises: Stretches   Active Hamstring Stretch  Right;Left;2 reps;30 seconds    Active Hamstring Stretch Limitations  seated    Single Knee to Chest Stretch  --    Gastroc Stretch  3 reps;30 seconds;Right;Left    Gastroc Stretch Limitations  slantboard    Other Lumbar Stretch Exercise  seated flexion stool roll outs 10 sec X 10      Lumbar Exercises: Aerobic   Recumbent Bike  5 min L2      Lumbar Exercises: Machines for Strengthening   Leg Press  75 lbs bilat push 3X10      Lumbar Exercises: Standing   Other Standing Lumbar Exercises  hip abd, marches, ext, H.S curls with 3 lb weight X 15 reps bilat    Other Standing Lumbar Exercises  squats with UE support 2X10 with touching butt to mat table to facilitate more hip hinge      Lumbar Exercises: Seated   Long Arc Quad on Chair  Both;20 reps    LAQ on Chair Weights (lbs)  3      Lumbar Exercises: Supine   Clam  20 reps    Clam Limitations  green    Bridge  15 reps      Modalities   Modalities  --   declined today, she is feeling good                 PT Long Term Goals - 02/06/20 1339      PT LONG TERM GOAL #1   Title  Pt will be I and compliant with HEP. Target for all goals 6 weeks 03/19/20    Status  New      PT LONG TERM GOAL #2   Title  Pt will reduce overall pain in her hips/legs to less than 4/10 on avg    Baseline  9    Status  New      PT LONG TERM GOAL #3   Title  Pt will improve bilat hip strength to at least 4+/5 in sitting    Baseline  4/5    Status  New      PT LONG TERM GOAL #4   Title  Pt will be able to stand for at least 30 minutes to perform ADLs    Baseline  can only stand 5 minutes    Status  New            Plan - 02/22/20 1205    Clinical  Impression Statement  She was having less pain today  so was able to progress her standing closed chain strengthening program with good tolerance and without complaints. Overall making progress with strength, activity tolerance, and overall standing/walking tolerance but still with deficits and will continue to benefit from PT.    Personal Factors and Comorbidities  Comorbidity 3+    Comorbidities  PMH: CAD,CABG,HIV,cerv fusion 2011    Examination-Activity Limitations  Carry;Squat;Stairs;Lift;Stand    Stability/Clinical Decision Making  Evolving/Moderate complexity    Rehab Potential  Good    PT Frequency  2x / week    PT Duration  6 weeks    PT Treatment/Interventions  ADLs/Self Care Home Management;Aquatic Therapy;Cryotherapy;Electrical Stimulation;Iontophoresis 4mg /ml Dexamethasone;Moist Heat;Traction;Ultrasound;Gait training;Therapeutic activities;Therapeutic exercise;Neuromuscular re-education;Patient/family education;Manual techniques;Passive range of motion;Dry needling;Joint Manipulations;Spinal Manipulations;Taping    PT Next Visit Plan  review and update HEP, appeared to prefer flexion, consider traction or modalaties    PT Home Exercise Plan  [email or web address]    Consulted and Agree with Plan of Care  Patient       Patient will benefit from skilled therapeutic intervention in order to improve the following deficits and impairments:  Abnormal gait, Decreased activity tolerance, Decreased endurance, Decreased range of motion, Decreased strength, Difficulty walking, Increased muscle spasms, Impaired flexibility, Pain  Visit Diagnosis: Pain in right hip  Acute low back pain, unspecified back pain laterality, unspecified whether sciatica present  Pain in left hip  Acute pain of right knee     Problem List Patient Active Problem List   Diagnosis Date Noted  . Prediabetes 12/03/2019  . Chronic pain of lower extremity 12/03/2019  . Neuropathy 08/17/2019  . Suspected deep vein  thrombosis (DVT) 04/09/2019  . AIN grade III 01/18/2019  . Left flank pain 12/14/2018  . Left leg pain 12/14/2018  . 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 LGSIL Pap smear of vagina 04/28/2017  . 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 Sinusitis 12/30/2015  . History of fusion of cervical spine 08/27/2015  . 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 Physical exam, annual 09/06/2014  . Alopecia of scalp 09/06/2014  . Hypertriglyceridemia without hypercholesterolemia 08/08/2014  . Dysuria 07/13/2012  . Essential hypertension 06/12/2009  . MONILIASIS, ORAL 09/24/2008  . HX, PERSONAL, PAST NONCOMPLIANCE 11/24/2006  . Human immunodeficiency virus (HIV) disease (HCC) 09/15/2000  . HYSTERECTOMY, HX OF 11/10/1983    01/08/1984 02/22/2020, 12:08 PM  [facility] Physical Therapy [address] [place], [place], [place] Phone: [phone]   Fax:  [phone]  Name: MELYNDA KRZYWICKI MRN: [name] Date of Birth: [birth year]

## 2020-02-23 LAB — HLA B*5701: HLA-B*5701 w/rflx HLA-B High: NEGATIVE

## 2020-02-23 LAB — QUANTIFERON-TB GOLD PLUS
Mitogen-NIL: 7.96 IU/mL
NIL: 0.05 IU/mL
QuantiFERON-TB Gold Plus: NEGATIVE
TB1-NIL: 0 IU/mL
TB2-NIL: <0 IU/mL

## 2020-02-23 LAB — HEPATITIS C ANTIBODY
Hepatitis C Ab: NONREACTIVE
SIGNAL TO CUT-OFF: 0.02 (ref <1.00)

## 2020-02-23 LAB — HIV-1 RNA QUANT-NO REFLEX-BLD
HIV 1 RNA Quant: <20 copies/mL
HIV-1 RNA Quant, Log: <1.3 Log copies/mL

## 2020-02-23 LAB — HEPATITIS B CORE ANTIBODY, TOTAL: Hep B Core Total Ab: NONREACTIVE

## 2020-02-23 LAB — HEPATITIS B SURFACE ANTIBODY,QUALITATIVE: Hep B S Ab: REACTIVE — AB

## 2020-02-23 LAB — HEPATITIS B SURFACE ANTIGEN: Hepatitis B Surface Ag: NONREACTIVE

## 2020-02-27 ENCOUNTER — Other Ambulatory Visit: Payer: Self-pay

## 2020-02-27 ENCOUNTER — Ambulatory Visit: Payer: Medicare HMO | Admitting: Physical Therapy

## 2020-02-27 DIAGNOSIS — M25561 Pain in right knee: Secondary | ICD-10-CM

## 2020-02-27 DIAGNOSIS — M25552 Pain in left hip: Secondary | ICD-10-CM

## 2020-02-27 DIAGNOSIS — M545 Low back pain, unspecified: Secondary | ICD-10-CM

## 2020-02-27 DIAGNOSIS — M25551 Pain in right hip: Secondary | ICD-10-CM | POA: Diagnosis not present

## 2020-02-27 NOTE — Therapy (Signed)
[facility] Physical Therapy [address] [place], [place], [address] Phone: [phone]   Fax:  [phone]  Physical Therapy Treatment  Patient Details  Name: Cathy Smith MRN: [id number] Date of Birth: 10/28/1966 Referring Provider (PT): Hilts, MD   Encounter Date: 02/27/2020  PT End of Session - 02/27/20 1124    Visit Number  5    Number of Visits  12    Date for PT Re-Evaluation  03/19/20    PT Start Time  1105    PT Stop Time  1200   last 10 min on ice   PT Time Calculation (min)  55 min    Activity Tolerance  Patient tolerated treatment well    Behavior During Therapy  [facility] for tasks assessed/performed       Past Medical History:  Diagnosis Date  . Coronary artery disease   . History of condyloma acuminatum   . History of uterine fibroid   . History of vaginal dysplasia    VAIN III--  oncologist-  dr Denman George  . History of vulvar dysplasia    VIN III   gyn-oncologist-  dr Denman George  . HIV infection ([place]) [facility]-  dr hatcher  . Hypertension   . Hypertriglyceridemia without hypercholesterolemia   . PONV (postoperative nausea and vomiting)   . VIN III (vulvar intraepithelial neoplasia III)   . Wears glasses     Past Surgical History:  Procedure Laterality Date  . ABDOMINAL HYSTERECTOMY  1995 approx  . ANTERIOR CERVICAL DECOMP/DISCECTOMY FUSION  11-30-2009   C4 -- C6  . CO2 LASER APPLICATION Bilateral 06/03/3663   Procedure: BILATERAL CO2 LASER ABLATION OF THE VULVA;  Surgeon: Everitt Amber, MD;  Location: [place];  Service: Gynecology;  Laterality: Bilateral;  . CO2 LASER APPLICATION N/A 02/09/4741   Procedure: CO2 LASER OF THE VULVAR;  Surgeon: Everitt Amber, MD;  Location: [place];  Service: Gynecology;  Laterality: N/A;  . CO2 LASER APPLICATION N/A 5/95/6387   Procedure: CO2 LASER VAPORIZATION OF THE VULVA;  Surgeon: Everitt Amber, MD;  Location: [place];  Service:  Gynecology;  Laterality: N/A;  . CO2 LASER APPLICATION N/A 5/64/3329   Procedure: CO2 LASER APPLICATION OF VULVA;  Surgeon: Everitt Amber, MD;  Location: [place];  Service: Gynecology;  Laterality: N/A;  . COLONOSCOPY  01-28-2015  . CORONARY ARTERY BYPASS GRAFT N/A 11/25/2017   Procedure: CORONARY ARTERY BYPASS GRAFTING (CABG), ON PUMP, TIMES THREE, USING LEFT INTERNAL MAMMARY ARTERY AND ENDOSCOPICALLY HARVESTED LEFT GREATER SAPHENOUS VEIN;  Surgeon: Rexene Alberts, MD;  Location: [place];  Service: Open Heart Surgery;  Laterality: N/A;  . I & D LEFT BUTTOCK ABSCESS  04-28-2001  . LEFT HEART CATH AND CORONARY ANGIOGRAPHY N/A 11/22/2017   Procedure: LEFT HEART CATH AND CORONARY ANGIOGRAPHY;  Surgeon: Lorretta Harp, MD;  Location: [place] [facility];  Service: Cardiovascular;  Laterality: N/A;  . TEE WITHOUT CARDIOVERSION N/A 11/25/2017   Procedure: TRANSESOPHAGEAL ECHOCARDIOGRAM (TEE);  Surgeon: Rexene Alberts, MD;  Location: [place];  Service: Open Heart Surgery;  Laterality: N/A;  . VULVECTOMY Right 05/06/2015   Procedure: WIDE LOCAL  EXCISION  OF RIGHT VULVA;  Surgeon: Everitt Amber, MD;  Location: [place];  Service: Gynecology;  Laterality: Right;  [place] Kitchen VULVECTOMY N/A 05/04/2017   Procedure: WIDE EXCISION VULVECTOMY;  Surgeon: Everitt Amber, MD;  Location: [facility];  Service: Gynecology;  Laterality: N/A;  . VULVECTOMY  N/A 12/27/2018   Procedure: WIDE EXCISION VULVECTOMY;  Surgeon: Adolphus Birchwood, MD;  Location: [facility];  Service: Gynecology;  Laterality: N/A;    There were no vitals filed for this visit.  Subjective Assessment - 02/27/20 1120    Subjective  about 3/10 overall pain in her back, feels the exercises are helping    Pertinent History  PMH: CAD,CABG,HIV,cerv fusion 2011    Limitations  House hold activities;Lifting;Standing;Walking    How long can you sit comfortably?  not limited    How long can you stand  comfortably?  few minutes    How long can you walk comfortably?  down her driveway and back    Diagnostic tests  no recent imaging    Pain Onset  More than a month ago         [facility] Adult PT Treatment/Exercise - 02/27/20 0001      Lumbar Exercises: Stretches   Active Hamstring Stretch  Right;Left;2 reps;30 seconds    Active Hamstring Stretch Limitations  seated    Gastroc Stretch  3 reps;30 seconds;Right;Left    Gastroc Stretch Limitations  slantboard    Other Lumbar Stretch Exercise  seated flexion stool roll outs 10 sec X 10      Lumbar Exercises: Aerobic   Recumbent Bike  6 min L3      Lumbar Exercises: Machines for Strengthening   Leg Press  75 lbs bilat push 3X15      Lumbar Exercises: Standing   Other Standing Lumbar Exercises  Lateral walking with red band around knees 50 ft up/down.  hip abd, marches, H.S curls with 4 lb weights X 15 reps bilat    Other Standing Lumbar Exercises  squats with UE support 2X10 with touching butt to mat table to facilitate more hip hinge      Lumbar Exercises: Seated   Long Arc Quad on Chair  Both;20 reps    LAQ on Chair Weights (lbs)  3                       Modalities   Modalities  Cryotherapy   declined today, she is feeling good     Cryotherapy   Number Minutes Cryotherapy  10 Minutes    Cryotherapy Location  Hip   bilat in sidelying   Type of Cryotherapy  Ice pack          PT Long Term Goals - 02/06/20 1339      PT LONG TERM GOAL #1   Title  Pt will be I and compliant with HEP. Target for all goals 6 weeks 03/19/20    Status  New      PT LONG TERM GOAL #2   Title  Pt will reduce overall pain in her hips/legs to less than 4/10 on avg    Baseline  9    Status  New      PT LONG TERM GOAL #3   Title  Pt will improve bilat hip strength to at least 4+/5 in sitting    Baseline  4/5    Status  New      PT LONG TERM GOAL #4   Title  Pt will be able to stand for at least 30 minutes to perform ADLs    Baseline  can  only stand 5 minutes    Status  New            Plan - 02/27/20 1205  Clinical Impression Statement  Progressed standing exercises today and able to get away from supine therex to all standing therex with good tolerance. She appears to be making good functional strength gains. Continue POC    Personal Factors and Comorbidities  Comorbidity 3+    Comorbidities  PMH: CAD,CABG,HIV,cerv fusion 2011    Examination-Activity Limitations  Carry;Squat;Stairs;Lift;Stand    Stability/Clinical Decision Making  Evolving/Moderate complexity    Rehab Potential  Good    PT Frequency  2x / week    PT Duration  6 weeks    PT Treatment/Interventions  ADLs/Self Care Home Management;Aquatic Therapy;Cryotherapy;Electrical Stimulation;Iontophoresis 4mg /ml Dexamethasone;Moist Heat;Traction;Ultrasound;Gait training;Therapeutic activities;Therapeutic exercise;Neuromuscular re-education;Patient/family education;Manual techniques;Passive range of motion;Dry needling;Joint Manipulations;Spinal Manipulations;Taping    PT Next Visit Plan  review and update HEP, appeared to prefer flexion, consider traction or modalaties    PT Home Exercise Plan  [id number]    Consulted and Agree with Plan of Care  Patient       Patient will benefit from skilled therapeutic intervention in order to improve the following deficits and impairments:  Abnormal gait, Decreased activity tolerance, Decreased endurance, Decreased range of motion, Decreased strength, Difficulty walking, Increased muscle spasms, Impaired flexibility, Pain  Visit Diagnosis: Pain in right hip  Acute low back pain, unspecified back pain laterality, unspecified whether sciatica present  Pain in left hip  Acute pain of right knee     Problem List Patient Active Problem List   Diagnosis Date Noted  . Prediabetes 12/03/2019  . Chronic pain of lower extremity 12/03/2019  . Neuropathy 08/17/2019  . Suspected deep vein thrombosis (DVT) 04/09/2019  . AIN  grade III 01/18/2019  . Left flank pain 12/14/2018  . Left leg pain 12/14/2018  . 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 LGSIL Pap smear of vagina 04/28/2017  . 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 Sinusitis 12/30/2015  . History of fusion of cervical spine 08/27/2015  . 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 Physical exam, annual 09/06/2014  . Alopecia of scalp 09/06/2014  . Hypertriglyceridemia without hypercholesterolemia 08/08/2014  . Dysuria 07/13/2012  . Essential hypertension 06/12/2009  . MONILIASIS, ORAL 09/24/2008  . HX, PERSONAL, PAST NONCOMPLIANCE 11/24/2006  . Human immunodeficiency virus (HIV) disease ([facility]) 09/15/2000  . HYSTERECTOMY, HX OF 11/10/1983    01/08/1984, PT,DPT 02/27/2020, 12:07 PM  [facility] Physical Therapy [address] [place], [place], [place] Phone: [phone]   Fax:  [phone]  Name: Cathy Smith MRN: [name] Date of Birth: 02/22/1966

## 2020-02-29 ENCOUNTER — Other Ambulatory Visit: Payer: Self-pay

## 2020-02-29 ENCOUNTER — Ambulatory Visit: Payer: Medicare HMO | Admitting: Physical Therapy

## 2020-02-29 ENCOUNTER — Encounter: Payer: Self-pay | Admitting: Physical Therapy

## 2020-02-29 DIAGNOSIS — M545 Low back pain, unspecified: Secondary | ICD-10-CM

## 2020-02-29 DIAGNOSIS — M25551 Pain in right hip: Secondary | ICD-10-CM | POA: Diagnosis not present

## 2020-02-29 DIAGNOSIS — M25561 Pain in right knee: Secondary | ICD-10-CM

## 2020-02-29 DIAGNOSIS — M25552 Pain in left hip: Secondary | ICD-10-CM

## 2020-02-29 NOTE — Therapy (Signed)
[facility] Physical Therapy [address] [place], [place], [address] Phone: [phone]   Fax:  [phone]  Physical Therapy Treatment  Patient Details  Name: Cathy Smith MRN: [id number] Date of Birth: 09/02/1966 Referring Provider (PT): Hilts, MD   Encounter Date: 02/29/2020  PT End of Session - 02/29/20 1207    Visit Number  6    Number of Visits  12    Date for PT Re-Evaluation  03/19/20    PT Start Time  4356    PT Stop Time  1225    PT Time Calculation (min)  40 min    Activity Tolerance  Patient tolerated treatment well    Behavior During Therapy  [facility] for tasks assessed/performed       Past Medical History:  Diagnosis Date  . Coronary artery disease   . History of condyloma acuminatum   . History of uterine fibroid   . History of vaginal dysplasia    VAIN III--  oncologist-  dr Denman George  . History of vulvar dysplasia    VIN III   gyn-oncologist-  dr Denman George  . HIV infection ([place]) [facility]-  dr hatcher  . Hypertension   . Hypertriglyceridemia without hypercholesterolemia   . PONV (postoperative nausea and vomiting)   . VIN III (vulvar intraepithelial neoplasia III)   . Wears glasses     Past Surgical History:  Procedure Laterality Date  . ABDOMINAL HYSTERECTOMY  1995 approx  . ANTERIOR CERVICAL DECOMP/DISCECTOMY FUSION  11-30-2009   C4 -- C6  . CO2 LASER APPLICATION Bilateral 8/61/6837   Procedure: BILATERAL CO2 LASER ABLATION OF THE VULVA;  Surgeon: Everitt Amber, MD;  Location: [place];  Service: Gynecology;  Laterality: Bilateral;  . CO2 LASER APPLICATION N/A 2/90/2111   Procedure: CO2 LASER OF THE VULVAR;  Surgeon: Everitt Amber, MD;  Location: [place];  Service: Gynecology;  Laterality: N/A;  . CO2 LASER APPLICATION N/A 5/52/0802   Procedure: CO2 LASER VAPORIZATION OF THE VULVA;  Surgeon: Everitt Amber, MD;  Location: [place];  Service: Gynecology;   Laterality: N/A;  . CO2 LASER APPLICATION N/A 2/33/6122   Procedure: CO2 LASER APPLICATION OF VULVA;  Surgeon: Everitt Amber, MD;  Location: [place];  Service: Gynecology;  Laterality: N/A;  . COLONOSCOPY  01-28-2015  . CORONARY ARTERY BYPASS GRAFT N/A 11/25/2017   Procedure: CORONARY ARTERY BYPASS GRAFTING (CABG), ON PUMP, TIMES THREE, USING LEFT INTERNAL MAMMARY ARTERY AND ENDOSCOPICALLY HARVESTED LEFT GREATER SAPHENOUS VEIN;  Surgeon: Rexene Alberts, MD;  Location: [place];  Service: Open Heart Surgery;  Laterality: N/A;  . I & D LEFT BUTTOCK ABSCESS  04-28-2001  . LEFT HEART CATH AND CORONARY ANGIOGRAPHY N/A 11/22/2017   Procedure: LEFT HEART CATH AND CORONARY ANGIOGRAPHY;  Surgeon: Lorretta Harp, MD;  Location: [place] [facility];  Service: Cardiovascular;  Laterality: N/A;  . TEE WITHOUT CARDIOVERSION N/A 11/25/2017   Procedure: TRANSESOPHAGEAL ECHOCARDIOGRAM (TEE);  Surgeon: Rexene Alberts, MD;  Location: [place];  Service: Open Heart Surgery;  Laterality: N/A;  . VULVECTOMY Right 05/06/2015   Procedure: WIDE LOCAL  EXCISION  OF RIGHT VULVA;  Surgeon: Everitt Amber, MD;  Location: [place];  Service: Gynecology;  Laterality: Right;  [place] Kitchen VULVECTOMY N/A 05/04/2017   Procedure: WIDE EXCISION VULVECTOMY;  Surgeon: Everitt Amber, MD;  Location: [facility];  Service: Gynecology;  Laterality: N/A;  . VULVECTOMY N/A 12/27/2018   Procedure: WIDE  EXCISION VULVECTOMY;  Surgeon: Everitt Amber, MD;  Location: [facility];  Service: Gynecology;  Laterality: N/A;    There were no vitals filed for this visit.  Subjective Assessment - 02/29/20 1203    Subjective  Pt arriving to therapy reporting 9/10 pain in her left lateral hip.    Pertinent History  PMH: CAD,CABG,HIV,cerv fusion 2011    Limitations  House hold activities;Lifting;Standing;Walking    How long can you sit comfortably?  not limited    How long can you stand comfortably?  few  minutes    How long can you walk comfortably?  down her driveway and back    Diagnostic tests  no recent imaging    Currently in Pain?  Yes    Pain Score  9     Pain Location  Hip    Pain Orientation  Left   Pt reporting her pain is more on left   Pain Descriptors / Indicators  Aching;Tender;Throbbing    Pain Type  Acute pain    Pain Onset  More than a month ago                       [facility] Adult PT Treatment/Exercise - 02/29/20 0001      Lumbar Exercises: Stretches   Active Hamstring Stretch  Right;Left;3 reps;30 seconds    Active Hamstring Stretch Limitations  supine with green strap    Gastroc Stretch  3 reps;30 seconds;Right;Left    Gastroc Stretch Limitations  slantboard    Other Lumbar Stretch Exercise  trunk rotation holding 20 seocnds each side      Lumbar Exercises: Aerobic   Recumbent Bike  6 min L3      Lumbar Exercises: Standing   Other Standing Lumbar Exercises  sit to stand, funcitonal squats x 10       Lumbar Exercises: Seated   Long Arc Quad on Chair  Both;20 reps    LAQ on Chair Weights (lbs)  3      Lumbar Exercises: Supine   Bridge  15 reps    Other Supine Lumbar Exercises  D1 LE x 10 reps each LE , 2 sets using red theraband                  PT Long Term Goals - 02/29/20 1223      PT LONG TERM GOAL #1   Title  Pt will be I and compliant with HEP. Target for all goals 6 weeks 03/19/20    Status  On-going      PT LONG TERM GOAL #2   Title  Pt will reduce overall pain in her hips/legs to less than 4/10 on avg    Status  On-going      PT LONG TERM GOAL #3   Title  Pt will improve bilat hip strength to at least 4+/5 in sitting    Status  On-going      PT LONG TERM GOAL #4   Title  Pt will be able to stand for at least 30 minutes to perform ADLs    Baseline  standing time has improved to 10-15 minutes from 5 minutes initially    Status  On-going            Plan - 02/29/20 1207    Clinical Impression Statement  Pt  arriving to therpay reporting 9/10 pain in her left hip today. Pt tolerating exericses with repeated instrutions for techniques and instructions. Pt  reporting at end of session her pain decressed to 6-7/10 in her Left hip. Continue skilled PT and progress toward LTG"s no goals met this session.    Personal Factors and Comorbidities  Comorbidity 3+    Comorbidities  PMH: CAD,CABG,HIV,cerv fusion 2011    Examination-Activity Limitations  Carry;Squat;Stairs;Lift;Stand    Examination-Participation Restrictions  Community Activity;Laundry;Meal Prep    Stability/Clinical Decision Making  Evolving/Moderate complexity    Rehab Potential  Good    PT Frequency  2x / week    PT Duration  6 weeks    PT Treatment/Interventions  ADLs/Self Care Home Management;Aquatic Therapy;Cryotherapy;Electrical Stimulation;Iontophoresis 61m/ml Dexamethasone;Moist Heat;Traction;Ultrasound;Gait training;Therapeutic activities;Therapeutic exercise;Neuromuscular re-education;Patient/family education;Manual techniques;Passive range of motion;Dry needling;Joint Manipulations;Spinal Manipulations;Taping    PT Next Visit Plan  review and update HEP, appeared to prefer flexion, consider traction or modalaties    PT Home Exercise Plan  [id number]    Consulted and Agree with Plan of Care  Patient       Patient will benefit from skilled therapeutic intervention in order to improve the following deficits and impairments:  Abnormal gait, Decreased activity tolerance, Decreased endurance, Decreased range of motion, Decreased strength, Difficulty walking, Increased muscle spasms, Impaired flexibility, Pain  Visit Diagnosis: Pain in left hip  Acute low back pain, unspecified back pain laterality, unspecified whether sciatica present  Pain in right hip  Acute pain of right knee     Problem List Patient Active Problem List   Diagnosis Date Noted  . Prediabetes 12/03/2019  . Chronic pain of lower extremity 12/03/2019  .  Neuropathy 08/17/2019  . Suspected deep vein thrombosis (DVT) 04/09/2019  . AIN grade III 01/18/2019  . Left flank pain 12/14/2018  . Left leg pain 12/14/2018  . 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 LGSIL Pap smear of vagina 04/28/2017  . 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 Sinusitis 12/30/2015  . History of fusion of cervical spine 08/27/2015  . 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 Physical exam, annual 09/06/2014  . Alopecia of scalp 09/06/2014  . Hypertriglyceridemia without hypercholesterolemia 08/08/2014  . Dysuria 07/13/2012  . Essential hypertension 06/12/2009  . MONILIASIS, ORAL 09/24/2008  . HX, PERSONAL, PAST NONCOMPLIANCE 11/24/2006  . Human immunodeficiency virus (HIV) disease ([place] 09/15/2000  . HYSTERECTOMY, HX OF 11/10/1983    JOretha Caprice MPT 02/29/2020, 12:40 PM  [facility] [address] [place] [address]Phone: [phone]  Fax:  [phone] Name: RTANAE PETROSKYMRN: [id number] of Birth: 111/29/1967

## 2020-03-01 ENCOUNTER — Encounter: Payer: Self-pay | Admitting: Cardiovascular Disease

## 2020-03-01 ENCOUNTER — Ambulatory Visit: Payer: Medicare HMO | Admitting: Cardiovascular Disease

## 2020-03-01 VITALS — BP 116/88 | HR 68 | Ht 62.0 in | Wt 163.0 lb

## 2020-03-01 DIAGNOSIS — E781 Pure hyperglyceridemia: Secondary | ICD-10-CM

## 2020-03-01 DIAGNOSIS — I10 Essential (primary) hypertension: Secondary | ICD-10-CM

## 2020-03-01 DIAGNOSIS — Z951 Presence of aortocoronary bypass graft: Secondary | ICD-10-CM | POA: Diagnosis not present

## 2020-03-01 DIAGNOSIS — I739 Peripheral vascular disease, unspecified: Secondary | ICD-10-CM | POA: Diagnosis not present

## 2020-03-01 LAB — LIPID PANEL
Chol/HDL Ratio: 3.3 ratio (ref 0.0–4.4)
Cholesterol, Total: 143 mg/dL (ref 100–199)
HDL: 44 mg/dL (ref >39)
LDL Chol Calc (NIH): 69 mg/dL (ref 0–99)
Triglycerides: 175 mg/dL — ABNORMAL HIGH (ref 0–149)
VLDL Cholesterol Cal: 30 mg/dL (ref 5–40)

## 2020-03-01 LAB — HEPATIC FUNCTION PANEL
ALT: 58 IU/L — ABNORMAL HIGH (ref 0–32)
AST: 52 IU/L — ABNORMAL HIGH (ref 0–40)
Albumin: 4.9 g/dL (ref 3.8–4.9)
Alkaline Phosphatase: 98 IU/L (ref 39–117)
Bilirubin Total: 0.4 mg/dL (ref 0.0–1.2)
Bilirubin, Direct: 0.14 mg/dL (ref 0.00–0.40)
Total Protein: 8.1 g/dL (ref 6.0–8.5)

## 2020-03-01 NOTE — Assessment & Plan Note (Addendum)
History of CAD status post high risk Myoview stress test which led to a cardiac catheterization by myself 11/22/2017 revealing left main/three-vessel disease with preserved LV function.  She underwent CABG x3 by Dr. Cornelius Moras 11/25/2017 with a LIMA to her LAD, vein to an obtuse marginal branch and to the posterior lateral branch of the RCA.  Her postop course was on complicated.  She denies chest pain or shortness of breath.

## 2020-03-01 NOTE — Progress Notes (Signed)
03/01/2020 Cathy Smith   [birth year]  [id number]  Primary Physician Kallie Locks, FNP Primary Cardiologist: Runell Gess MD Nicholes [place], [place]  HPI:  Cathy Smith is a 54 y.o.  moderately overweight married African-American female with no children. Does not work. She was referred to me byKimberly Harris FNPfor new onset chest pain.I last saw her for a virtual telemedicine video visit 03/01/2019.  She really has no cardiac risk factors other than hypertension hyperlipidemia both of which are treated. There is no family history. She does not smoke. She had onset of chest pain 3-4 months ago,, currently trying to 3 times a week lasting up to 5-10 minutes at a time associated with shortness of breath.she had a Myoview stress test that showed severe ischemia in the LAD territory. She presentsunderwent cardiac catheterization by myself 11/22/17 revealing left main/three-vessel disease with preserved LV function. She underwent coronary artery bypass graft 3 11/25/17 by Dr. Ashley Mariner with a LIMA to LAD, vein to obtuse marginal branch and Vein to the posterior lateral branch of the RCA. Her postoperative course was uncomplicated.  She completed the cardiac rehab program.  Since I saw her for her telemedicine video visit 1 year ago she continues to do well.  Her major complaint is of left lower extremity discomfort consistent with claudication.  She denies chest pain or shortness of breath.  Current Meds  Medication Sig  . acetaminophen (TYLENOL) 325 MG tablet Take 650 mg by mouth every 6 (six) hours as needed.  Marland Kitchen aspirin EC 325 MG EC tablet Take 1 tablet (325 mg total) by mouth daily.  Marland Kitchen atorvastatin (LIPITOR) 20 MG tablet   . cyclobenzaprine (FLEXERIL) 10 MG tablet   . darunavir (PREZISTA) 800 MG tablet TAKE 1 TABLET(800 MG) BY MOUTH DAILY  . elvitegravir-cobicistat-emtricitabine-tenofovir (GENVOYA) 150-150-200-10 MG TABS tablet Take 1 tablet by mouth daily with breakfast.  .  furosemide (LASIX) 40 MG tablet TAKE 1 TABLET(40 MG) BY MOUTH DAILY  . gabapentin (NEURONTIN) 300 MG capsule Take 1 capsule (300 mg total) by mouth 3 (three) times daily.  . metoprolol tartrate (LOPRESSOR) 25 MG tablet Take 1.5 tablets (37.5 mg total) by mouth 2 (two) times daily.     No Known Allergies  Social History   Socioeconomic History  . Marital status: Married    Spouse name: Not on file  . Number of children: Not on file  . Years of education: Not on file  . Highest education level: Not on file  Occupational History  . Not on file  Tobacco Use  . Smoking status: Never Smoker  . Smokeless tobacco: Never Used  Substance and Sexual Activity  . Alcohol use: No    Alcohol/week: 0.0 standard drinks  . Drug use: No  . Sexual activity: Yes    Partners: Male    Birth control/protection: Condom    Comment: pt. given condoms  Other Topics Concern  . Not on file  Social History Narrative  . Not on file   Social Determinants of Health   Financial Resource Strain:   . Difficulty of Paying Living Expenses:   Food Insecurity:   . Worried About Programme researcher, broadcasting/film/video in the Last Year:   . Barista in the Last Year:   Transportation Needs:   . Freight forwarder (Medical):   Marland Kitchen Lack of Transportation (Non-Medical):   Physical Activity:   . Days of Exercise per Week:   . Minutes of  Exercise per Session:   Stress:   . Feeling of Stress :   Social Connections:   . Frequency of Communication with Friends and Family:   . Frequency of Social Gatherings with Friends and Family:   . Attends Religious Services:   . Active Member of Clubs or Organizations:   . Attends Archivist Meetings:   Marland Kitchen Marital Status:   Intimate Partner Violence:   . Fear of Current or Ex-Partner:   . Emotionally Abused:   Marland Kitchen Physically Abused:   . Sexually Abused:      Review of Systems: General: negative for chills, fever, night sweats or weight changes.  Cardiovascular: negative  for chest pain, dyspnea on exertion, edema, orthopnea, palpitations, paroxysmal nocturnal dyspnea or shortness of breath Dermatological: negative for rash Respiratory: negative for cough or wheezing Urologic: negative for hematuria Abdominal: negative for nausea, vomiting, diarrhea, bright red blood per rectum, melena, or hematemesis Neurologic: negative for visual changes, syncope, or dizziness All other systems reviewed and are otherwise negative except as noted above.    Blood pressure 116/88, pulse 68, height 5\' 2"  (1.575 m), weight 163 lb (73.9 kg), SpO2 99 %.  General appearance: alert and no distress Neck: no adenopathy, no carotid bruit, no JVD, supple, symmetrical, trachea midline and thyroid not enlarged, symmetric, no tenderness/mass/nodules Lungs: clear to auscultation bilaterally Heart: regular rate and rhythm, S1, S2 normal, no murmur, click, rub or gallop Extremities: extremities normal, atraumatic, no cyanosis or edema Pulses: 2+ and symmetric Skin: Skin color, texture, turgor normal. No rashes or lesions Neurologic: Alert and oriented X 3, normal strength and tone. Normal symmetric reflexes. Normal coordination and gait  EKG sinus rhythm at 68 with incomplete right bundle branch block and low limb voltage with left axis deviation.  I personally reviewed this EKG.  ASSESSMENT AND PLAN:   Essential hypertension History of essential hypertension a blood pressure measured today 116/88.  She is on metoprolol  Hypertriglyceridemia without hypercholesterolemia History of hyperlipidemia on atorvastatin with lipid profile performed 08/16/2019 revealing a total cholesterol 143.  I am to check a lipid liver profile today.  S/P CABG x 3 History of CAD status post high risk Myoview stress test which led to a cardiac catheterization by myself 11/22/2017 revealing left main/three-vessel disease with preserved LV function.  She underwent CABG x3 by Dr. Roxy Manns 11/25/2017 with a LIMA to her  LAD, vein to an obtuse marginal branch and to the posterior lateral branch of the RCA.  Her postop course was on complicated.  She denies chest pain or shortness of breath.  Claudication in peripheral vascular disease ([place]) Complains of symptoms compatible with left lower extremity claudication.  I am going to order lower extremity arterial Doppler studies to further evaluate      Lorretta Harp MD [facility], [facility] 03/01/2020 11:17 AM

## 2020-03-01 NOTE — Assessment & Plan Note (Signed)
History of essential hypertension a blood pressure measured today 116/88.  She is on metoprolol

## 2020-03-01 NOTE — Patient Instructions (Signed)
Medication Instructions:  Your physician recommends that you continue on your current medications as directed. Please refer to the Current Medication list given to you today.  *If you need a refill on your cardiac medications before your next appointment, please call your pharmacy*  Lab Work: Today (lipid, hepatic)  If you have labs (blood work) drawn today and your tests are completely normal, you will receive your results only by: Marland Kitchen MyChart Message (if you have MyChart) OR . A paper copy in the mail If you have any lab test that is abnormal or we need to change your treatment, we will call you to review the results.  Testing/Procedures: Your physician has requested that you have a lower extremity arterial duplex. This test is an ultrasound of the arteries in the legs. It looks at arterial blood flow in the legs. Allow one hour for Lower Arterial scans. There are no restrictions or special instructions  Follow-Up: At [facility], you and your health needs are our priority.  As part of our continuing mission to provide you with exceptional heart care, we have created designated Provider Care Teams.  These Care Teams include your primary Cardiologist (physician) and Advanced Practice Providers (APPs -  Physician Assistants and Nurse Practitioners) who all work together to provide you with the care you need, when you need it.  We recommend signing up for the patient portal called "MyChart".  Sign up information is provided on this After Visit Summary.  MyChart is used to connect with patients for Virtual Visits (Telemedicine).  Patients are able to view lab/test results, encounter notes, upcoming appointments, etc.  Non-urgent messages can be sent to your provider as well.   To learn more about what you can do with MyChart, go to [email or web address].    Your next appointment:   6 month(s)  The format for your next appointment:   In Person  Provider:    Corine Shelter, PA-C  Marjie Skiff, PA-C  Edd Fabian, FNP   12 months with Dr. Allyson Sabal

## 2020-03-01 NOTE — Assessment & Plan Note (Signed)
Complains of symptoms compatible with left lower extremity claudication.  I am going to order lower extremity arterial Doppler studies to further evaluate

## 2020-03-01 NOTE — Assessment & Plan Note (Signed)
History of hyperlipidemia on atorvastatin with lipid profile performed 08/16/2019 revealing a total cholesterol 143.  I am to check a lipid liver profile today.

## 2020-03-05 ENCOUNTER — Other Ambulatory Visit: Payer: Self-pay

## 2020-03-05 ENCOUNTER — Other Ambulatory Visit: Payer: Self-pay | Admitting: Family Medicine

## 2020-03-05 ENCOUNTER — Ambulatory Visit: Payer: Medicare HMO | Admitting: Physical Therapy

## 2020-03-05 DIAGNOSIS — M25551 Pain in right hip: Secondary | ICD-10-CM | POA: Diagnosis not present

## 2020-03-05 DIAGNOSIS — M545 Low back pain, unspecified: Secondary | ICD-10-CM

## 2020-03-05 DIAGNOSIS — M25561 Pain in right knee: Secondary | ICD-10-CM | POA: Diagnosis not present

## 2020-03-05 DIAGNOSIS — M25552 Pain in left hip: Secondary | ICD-10-CM

## 2020-03-05 DIAGNOSIS — I10 Essential (primary) hypertension: Secondary | ICD-10-CM

## 2020-03-05 NOTE — Therapy (Signed)
[facility] Physical Therapy [address] [place], [place], [address] Phone: [phone]   Fax:  [phone]  Physical Therapy Treatment  Patient Details  Name: DARRIS CARACHURE MRN: [id number] Date of Birth: [birth year] Referring Provider (PT): Hilts, MD   Encounter Date: 03/05/2020  PT End of Session - 03/05/20 1344    Visit Number  7    Number of Visits  12    Date for PT Re-Evaluation  03/19/20    PT Start Time  1145    PT Stop Time  1225    PT Time Calculation (min)  40 min    Activity Tolerance  Patient tolerated treatment well    Behavior During Therapy  [facility] for tasks assessed/performed       Past Medical History:  Diagnosis Date  . Coronary artery disease   . History of condyloma acuminatum   . History of uterine fibroid   . History of vaginal dysplasia    VAIN III--  oncologist-  dr Andrey Farmer  . History of vulvar dysplasia    VIN III   gyn-oncologist-  dr Andrey Farmer  . HIV infection (HCC) [facility]-  dr hatcher  . Hypertension   . Hypertriglyceridemia without hypercholesterolemia   . PONV (postoperative nausea and vomiting)   . VIN III (vulvar intraepithelial neoplasia III)   . Wears glasses     Past Surgical History:  Procedure Laterality Date  . ABDOMINAL HYSTERECTOMY  1995 approx  . ANTERIOR CERVICAL DECOMP/DISCECTOMY FUSION  11-30-2009   C4 -- C6  . CO2 LASER APPLICATION Bilateral 07/23/2015   Procedure: BILATERAL CO2 LASER ABLATION OF THE VULVA;  Surgeon: Adolphus Birchwood, MD;  Location: [facility];  Service: Gynecology;  Laterality: Bilateral;  . CO2 LASER APPLICATION N/A 02/25/2016   Procedure: CO2 LASER OF THE VULVAR;  Surgeon: Adolphus Birchwood, MD;  Location: [facility];  Service: Gynecology;  Laterality: N/A;  . CO2 LASER APPLICATION N/A 05/04/2017   Procedure: CO2 LASER VAPORIZATION OF THE VULVA;  Surgeon: Adolphus Birchwood, MD;  Location: [facility];  Service: Gynecology;   Laterality: N/A;  . CO2 LASER APPLICATION N/A 12/27/2018   Procedure: CO2 LASER APPLICATION OF VULVA;  Surgeon: Adolphus Birchwood, MD;  Location: [facility];  Service: Gynecology;  Laterality: N/A;  . COLONOSCOPY  01-28-2015  . CORONARY ARTERY BYPASS GRAFT N/A 11/25/2017   Procedure: CORONARY ARTERY BYPASS GRAFTING (CABG), ON PUMP, TIMES THREE, USING LEFT INTERNAL MAMMARY ARTERY AND ENDOSCOPICALLY HARVESTED LEFT GREATER SAPHENOUS VEIN;  Surgeon: Purcell Nails, MD;  Location: [facility] OR;  Service: Open Heart Surgery;  Laterality: N/A;  . I & D LEFT BUTTOCK ABSCESS  04-28-2001  . LEFT HEART CATH AND CORONARY ANGIOGRAPHY N/A 11/22/2017   Procedure: LEFT HEART CATH AND CORONARY ANGIOGRAPHY;  Surgeon: Runell Gess, MD;  Location: [facility] INVAS[facility];  Service: Cardiovascular;  Laterality: N/A;  . TEE WITHOUT CARDIOVERSION N/A 11/25/2017   Procedure: TRANSESOPHAGEAL ECHOCARDIOGRAM (TEE);  Surgeon: Purcell Nails, MD;  Location: [facility] OR;  Service: Open Heart Surgery;  Laterality: N/A;  . VULVECTOMY Right 05/06/2015   Procedure: WIDE LOCAL  EXCISION  OF RIGHT VULVA;  Surgeon: Adolphus Birchwood, MD;  Location: [facility];  Service: Gynecology;  Laterality: Right;  Marland Kitchen VULVECTOMY N/A 05/04/2017   Procedure: WIDE EXCISION VULVECTOMY;  Surgeon: Adolphus Birchwood, MD;  Location: [facility];  Service: Gynecology;  Laterality: N/A;  . VULVECTOMY N/A 12/27/2018   Procedure: WIDE  EXCISION VULVECTOMY;  Surgeon: Adolphus Birchwood, MD;  Location: [place];  Service: Gynecology;  Laterality: N/A;    There were no vitals filed for this visit.  Subjective Assessment - 03/05/20 1339    Subjective  she relays feeling pretty good overall today with low pain levels    Pertinent History  PMH: CAD,CABG,HIV,cerv fusion 2011    Limitations  House hold activities;Lifting;Standing;Walking    How long can you sit comfortably?  not limited    How long can you stand comfortably?  few minutes     How long can you walk comfortably?  down her driveway and back    Diagnostic tests  no recent imaging    Pain Onset  More than a month ago        [facility] Adult PT Treatment/Exercise - 03/05/20 0001      Lumbar Exercises: Stretches   Single Knee to Chest Stretch  Right;Left;2 reps;30 seconds    Lower Trunk Rotation Limitations  10 sec X 5 reps bilat    Gastroc Stretch  3 reps;30 seconds;Right;Left    Gastroc Stretch Limitations  slantboard      Lumbar Exercises: Aerobic   Recumbent Bike  6 min L3      Lumbar Exercises: Machines for Strengthening   Leg Press  75 lbs bilat push 3X15      Lumbar Exercises: Standing   Other Standing Lumbar Exercises  Lateral walking with green band around knees 50 ft up/down X2     Other Standing Lumbar Exercises  sit to stand 2X10      Lumbar Exercises: Seated   Long Arc Quad on Chair  Both    LAQ on Chair Weights (lbs)  3    LAQ on Chair Limitations  30 reps bilat      Lumbar Exercises: Supine   Clam  20 reps    Clam Limitations  green    Bridge  15 reps                  PT Long Term Goals - 02/29/20 1223      PT LONG TERM GOAL #1   Title  Pt will be I and compliant with HEP. Target for all goals 6 weeks 03/19/20    Status  On-going      PT LONG TERM GOAL #2   Title  Pt will reduce overall pain in her hips/legs to less than 4/10 on avg    Status  On-going      PT LONG TERM GOAL #3   Title  Pt will improve bilat hip strength to at least 4+/5 in sitting    Status  On-going      PT LONG TERM GOAL #4   Title  Pt will be able to stand for at least 30 minutes to perform ADLs    Baseline  standing time has improved to 10-15 minutes from 5 minutes initially    Status  On-going            Plan - 03/05/20 1345    Clinical Impression Statement  overall much less pain than previous session so able to progress back her strength exercises with good tolerance and without complaints. She shows good effort with PT but needs  constant cuing for exercise instructions.    Personal Factors and Comorbidities  Comorbidity 3+    Comorbidities  PMH: CAD,CABG,HIV,cerv fusion 2011    Examination-Activity Limitations  Carry;Squat;Stairs;Lift;Stand    Examination-Participation Restrictions  Community  Activity;Laundry;Meal Prep    Stability/Clinical Decision Making  Evolving/Moderate complexity    Rehab Potential  Good    PT Frequency  2x / week    PT Duration  6 weeks    PT Treatment/Interventions  ADLs/Self Care Home Management;Aquatic Therapy;Cryotherapy;Electrical Stimulation;Iontophoresis 4mg /ml Dexamethasone;Moist Heat;Traction;Ultrasound;Gait training;Therapeutic activities;Therapeutic exercise;Neuromuscular re-education;Patient/family education;Manual techniques;Passive range of motion;Dry needling;Joint Manipulations;Spinal Manipulations;Taping    PT Next Visit Plan  hip and lumbar stretching and strength as tolerated    PT Home Exercise Plan  [id number]    Consulted and Agree with Plan of Care  Patient       Patient will benefit from skilled therapeutic intervention in order to improve the following deficits and impairments:  Abnormal gait, Decreased activity tolerance, Decreased endurance, Decreased range of motion, Decreased strength, Difficulty walking, Increased muscle spasms, Impaired flexibility, Pain  Visit Diagnosis: Pain in left hip  Acute low back pain, unspecified back pain laterality, unspecified whether sciatica present  Pain in right hip  Acute pain of right knee     Problem List Patient Active Problem List   Diagnosis Date Noted  . Claudication in peripheral vascular disease ([place]) 03/01/2020  . Prediabetes 12/03/2019  . Chronic pain of lower extremity 12/03/2019  . Neuropathy 08/17/2019  . Suspected deep vein thrombosis (DVT) 04/09/2019  . AIN grade III 01/18/2019  . Left flank pain 12/14/2018  . Left leg pain 12/14/2018  . 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 LGSIL Pap smear of vagina  04/28/2017  . 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 Sinusitis 12/30/2015  . History of fusion of cervical spine 08/27/2015  . 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 Physical exam, annual 09/06/2014  . Alopecia of scalp 09/06/2014  . Hypertriglyceridemia without hypercholesterolemia 08/08/2014  . Dysuria 07/13/2012  . Essential hypertension 06/12/2009  . MONILIASIS, ORAL 09/24/2008  . HX, PERSONAL, PAST NONCOMPLIANCE 11/24/2006  . Human immunodeficiency virus (HIV) disease ([place]) 09/15/2000  . HYSTERECTOMY, HX OF 11/10/1983    Debbe Odea, PT,DPT 03/05/2020, 1:46 PM  [facility] Physical Therapy [address] [place], [place], [address] Phone: [phone]   Fax:  [phone]  Name: SYRIAH DELISI MRN: [id number] Date of Birth: 1966-06-18

## 2020-03-07 ENCOUNTER — Encounter: Payer: Self-pay | Admitting: Physical Therapy

## 2020-03-07 ENCOUNTER — Ambulatory Visit (INDEPENDENT_AMBULATORY_CARE_PROVIDER_SITE_OTHER): Payer: Medicare HMO | Admitting: Physical Therapy

## 2020-03-07 ENCOUNTER — Other Ambulatory Visit: Payer: Self-pay

## 2020-03-07 DIAGNOSIS — M545 Low back pain, unspecified: Secondary | ICD-10-CM

## 2020-03-07 DIAGNOSIS — M25551 Pain in right hip: Secondary | ICD-10-CM | POA: Diagnosis not present

## 2020-03-07 DIAGNOSIS — M25561 Pain in right knee: Secondary | ICD-10-CM

## 2020-03-07 DIAGNOSIS — M25552 Pain in left hip: Secondary | ICD-10-CM

## 2020-03-07 NOTE — Therapy (Signed)
[facility] Physical Therapy [address] [place], [place], [address] Phone: [phone]   Fax:  [phone]  Physical Therapy Treatment  Patient Details  Name: Cathy Smith MRN: [id number] Date of Birth: July 12, 1966 Referring Provider (PT): Hilts, MD   Encounter Date: 03/07/2020  PT End of Session - 03/07/20 1227    Visit Number  8    Number of Visits  12    Date for PT Re-Evaluation  03/19/20    PT Start Time  1150    PT Stop Time  1230    PT Time Calculation (min)  40 min    Activity Tolerance  Patient tolerated treatment well    Behavior During Therapy  [facility] for tasks assessed/performed       Past Medical History:  Diagnosis Date  . Coronary artery disease   . History of condyloma acuminatum   . History of uterine fibroid   . History of vaginal dysplasia    VAIN III--  oncologist-  dr Denman George  . History of vulvar dysplasia    VIN III   gyn-oncologist-  dr Denman George  . HIV infection ([place]) [facility]-  dr hatcher  . Hypertension   . Hypertriglyceridemia without hypercholesterolemia   . PONV (postoperative nausea and vomiting)   . VIN III (vulvar intraepithelial neoplasia III)   . Wears glasses     Past Surgical History:  Procedure Laterality Date  . ABDOMINAL HYSTERECTOMY  1995 approx  . ANTERIOR CERVICAL DECOMP/DISCECTOMY FUSION  11-30-2009   C4 -- C6  . CO2 LASER APPLICATION Bilateral 3/41/9379   Procedure: BILATERAL CO2 LASER ABLATION OF THE VULVA;  Surgeon: Everitt Amber, MD;  Location: [place];  Service: Gynecology;  Laterality: Bilateral;  . CO2 LASER APPLICATION N/A 0/24/0973   Procedure: CO2 LASER OF THE VULVAR;  Surgeon: Everitt Amber, MD;  Location: [place];  Service: Gynecology;  Laterality: N/A;  . CO2 LASER APPLICATION N/A 5/32/9924   Procedure: CO2 LASER VAPORIZATION OF THE VULVA;  Surgeon: Everitt Amber, MD;  Location: [place];  Service: Gynecology;   Laterality: N/A;  . CO2 LASER APPLICATION N/A 2/68/3419   Procedure: CO2 LASER APPLICATION OF VULVA;  Surgeon: Everitt Amber, MD;  Location: [place];  Service: Gynecology;  Laterality: N/A;  . COLONOSCOPY  01-28-2015  . CORONARY ARTERY BYPASS GRAFT N/A 11/25/2017   Procedure: CORONARY ARTERY BYPASS GRAFTING (CABG), ON PUMP, TIMES THREE, USING LEFT INTERNAL MAMMARY ARTERY AND ENDOSCOPICALLY HARVESTED LEFT GREATER SAPHENOUS VEIN;  Surgeon: Rexene Alberts, MD;  Location: [place];  Service: Open Heart Surgery;  Laterality: N/A;  . I & D LEFT BUTTOCK ABSCESS  04-28-2001  . LEFT HEART CATH AND CORONARY ANGIOGRAPHY N/A 11/22/2017   Procedure: LEFT HEART CATH AND CORONARY ANGIOGRAPHY;  Surgeon: Lorretta Harp, MD;  Location: [place] CV [facility];  Service: Cardiovascular;  Laterality: N/A;  . TEE WITHOUT CARDIOVERSION N/A 11/25/2017   Procedure: TRANSESOPHAGEAL ECHOCARDIOGRAM (TEE);  Surgeon: Rexene Alberts, MD;  Location: [place];  Service: Open Heart Surgery;  Laterality: N/A;  . VULVECTOMY Right 05/06/2015   Procedure: WIDE LOCAL  EXCISION  OF RIGHT VULVA;  Surgeon: Everitt Amber, MD;  Location: [place];  Service: Gynecology;  Laterality: Right;  Marland Kitchen VULVECTOMY N/A 05/04/2017   Procedure: WIDE EXCISION VULVECTOMY;  Surgeon: Everitt Amber, MD;  Location[facility];  Service: Gynecology;  Laterality: N/A;  . VULVECTOMY N/A 12/27/2018   Procedure: WIDE  EXCISION VULVECTOMY;  Surgeon: Everitt Amber, MD;  Location: [facility];  Service: Gynecology;  Laterality: N/A;    There were no vitals filed for this visit.  Subjective Assessment - 03/07/20 1223    Subjective  some soreness today after housework, relays 1/10 pain/sorenss overall in her Left leg    Pertinent History  PMH: CAD,CABG,HIV,cerv fusion 2011    Limitations  House hold activities;Lifting;Standing;Walking    How long can you sit comfortably?  not limited    How long can you stand  comfortably?  few minutes    How long can you walk comfortably?  down her driveway and back    Diagnostic tests  no recent imaging    Pain Onset  More than a month ago         [facility] PT Assessment - 03/07/20 0001      Assessment   Medical Diagnosis  bilat leg pain, low back pain    Referring Provider (PT)  Hilts, MD      AROM   AROM Assessment Site  Lumbar    Lumbar Flexion  WFL    Lumbar Extension  50%    Lumbar - Right Side Bend  [facility]    Lumbar - Left Side Bend  WFL    Lumbar - Right Rotation  [facility]    Lumbar - Left Rotation  [facility]      Strength   Overall Strength Comments  bilat hip flexion 4/5, hip abd 4/5, knee strength 5/5, ankle DF 4/5, ankle PF 5/5                   [facility] Adult PT Treatment/Exercise - 03/07/20 0001      Lumbar Exercises: Stretches   Single Knee to Chest Stretch  Right;Left;2 reps;30 seconds    Lower Trunk Rotation Limitations  10 sec X 5 reps bilat    Gastroc Stretch  3 reps;30 seconds;Right;Left    Gastroc Stretch Limitations  slantboard      Lumbar Exercises: Aerobic   Recumbent Bike  6 min L3      Lumbar Exercises: Machines for Strengthening   Leg Press  75 lbs bilat push 3X15      Lumbar Exercises: Standing   Other Standing Lumbar Exercises  Lateral walking with green band around knees 50 ft up/down X2     Other Standing Lumbar Exercises  step ups 6 inch step X 15 reps bilat one UE support      Lumbar Exercises: Supine   Clam  20 reps    Clam Limitations  green    Bridge  20 reps                  PT Long Term Goals - 03/07/20 1240      PT LONG TERM GOAL #1   Title  Pt will be I and compliant with HEP. Target for all goals 6 weeks 03/19/20    Baseline  relays compliant with initial HEP but still needs come cuing with this    Status  On-going      PT LONG TERM GOAL #2   Title  Pt will reduce overall pain in her hips/legs to less than 4/10 on avg    Baseline  1/10 today    Status  Partially Met      PT LONG TERM  GOAL #3   Title  Pt will improve bilat hip strength to at least 4+/5 in sitting    Baseline  4/5    Status  On-going      PT LONG TERM GOAL #4   Title  Pt will be able to stand for at least 30 minutes to perform ADLs    Baseline  standing time has improved to 20 minutes from 5 minutes initially    Status  On-going            Plan - 03/07/20 1227    Clinical Impression Statement  Some soreness but pain seems to be managed over the last couple weeks    Personal Factors and Comorbidities  Comorbidity 3+    Comorbidities  PMH: CAD,CABG,HIV,cerv fusion 2011    Examination-Activity Limitations  Carry;Squat;Stairs;Lift;Stand    Examination-Participation Restrictions  Community Activity;Laundry;Meal Prep    Stability/Clinical Decision Making  Evolving/Moderate complexity    Rehab Potential  Good    PT Frequency  2x / week    PT Duration  6 weeks    PT Treatment/Interventions  ADLs/Self Care Home Management;Aquatic Therapy;Cryotherapy;Electrical Stimulation;Iontophoresis 60m/ml Dexamethasone;Moist Heat;Traction;Ultrasound;Gait training;Therapeutic activities;Therapeutic exercise;Neuromuscular re-education;Patient/family education;Manual techniques;Passive range of motion;Dry needling;Joint Manipulations;Spinal Manipulations;Taping    PT Next Visit Plan  hip and lumbar stretching and strength as tolerated    PT Home Exercise Plan  [id number]    Consulted and Agree with Plan of Care  Patient       Patient will benefit from skilled therapeutic intervention in order to improve the following deficits and impairments:  Abnormal gait, Decreased activity tolerance, Decreased endurance, Decreased range of motion, Decreased strength, Difficulty walking, Increased muscle spasms, Impaired flexibility, Pain  Visit Diagnosis: Pain in left hip  Acute low back pain, unspecified back pain laterality, unspecified whether sciatica present  Pain in right hip  Acute pain of right knee     Problem  List Patient Active Problem List   Diagnosis Date Noted  . Claudication in peripheral vascular disease ([place] 03/01/2020  . Prediabetes 12/03/2019  . Chronic pain of lower extremity 12/03/2019  . Neuropathy 08/17/2019  . Suspected deep vein thrombosis (DVT) 04/09/2019  . AIN grade III 01/18/2019  . Left flank pain 12/14/2018  . Left leg pain 12/14/2018  . 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 LGSIL Pap smear of vagina 04/28/2017  . 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 Sinusitis 12/30/2015  . History of fusion of cervical spine 08/27/2015  . 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 Physical exam, annual 09/06/2014  . Alopecia of scalp 09/06/2014  . Hypertriglyceridemia without hypercholesterolemia 08/08/2014  . Dysuria 07/13/2012  . Essential hypertension 06/12/2009  . MONILIASIS, ORAL 09/24/2008  . HX, PERSONAL, PAST NONCOMPLIANCE 11/24/2006  . Human immunodeficiency virus (HIV) disease ([place] 09/15/2000  . HYSTERECTOMY, HX OF 11/10/1983    BSilvestre Mesi4/29/2021, 12:44 PM  [facility] [address] [address]Phone: [phone]  Fax:  [phone] Name: Cathy VETSCHMRN: [id number] of Birth: [birth year]

## 2020-03-11 ENCOUNTER — Other Ambulatory Visit: Payer: Self-pay

## 2020-03-11 ENCOUNTER — Ambulatory Visit: Payer: Medicare HMO | Admitting: Physical Therapy

## 2020-03-11 DIAGNOSIS — M545 Low back pain, unspecified: Secondary | ICD-10-CM

## 2020-03-11 DIAGNOSIS — M25551 Pain in right hip: Secondary | ICD-10-CM

## 2020-03-11 DIAGNOSIS — M25561 Pain in right knee: Secondary | ICD-10-CM | POA: Diagnosis not present

## 2020-03-11 DIAGNOSIS — M25552 Pain in left hip: Secondary | ICD-10-CM | POA: Diagnosis not present

## 2020-03-11 DIAGNOSIS — R7401 Elevation of levels of liver transaminase levels: Secondary | ICD-10-CM

## 2020-03-11 NOTE — Therapy (Signed)
[facility] Physical Therapy [address] [place], [place], [address] Phone: [phone]   Fax:  [phone]  Physical Therapy Treatment  Patient Details  Name: Cathy Smith MRN: [id number] Date of Birth: 12/10/1965 Referring Provider (PT): Hilts, MD   Encounter Date: 03/11/2020  PT End of Session - 03/11/20 1142    Visit Number  9    Number of Visits  12    Date for PT Re-Evaluation  03/19/20    PT Start Time  1104    PT Stop Time  1145    PT Time Calculation (min)  41 min    Activity Tolerance  Patient tolerated treatment well    Behavior During Therapy  [facility] for tasks assessed/performed       Past Medical History:  Diagnosis Date  . Coronary artery disease   . History of condyloma acuminatum   . History of uterine fibroid   . History of vaginal dysplasia    VAIN III--  oncologist-  dr Denman George  . History of vulvar dysplasia    VIN III   gyn-oncologist-  dr Denman George  . HIV infection ([place]) [facility]-  dr hatcher  . Hypertension   . Hypertriglyceridemia without hypercholesterolemia   . PONV (postoperative nausea and vomiting)   . VIN III (vulvar intraepithelial neoplasia III)   . Wears glasses     Past Surgical History:  Procedure Laterality Date  . ABDOMINAL HYSTERECTOMY  1995 approx  . ANTERIOR CERVICAL DECOMP/DISCECTOMY FUSION  11-30-2009   C4 -- C6  . CO2 LASER APPLICATION Bilateral 11/17/3233   Procedure: BILATERAL CO2 LASER ABLATION OF THE VULVA;  Surgeon: Everitt Amber, MD;  Location: [place];  Service: Gynecology;  Laterality: Bilateral;  . CO2 LASER APPLICATION N/A 5/73/2202   Procedure: CO2 LASER OF THE VULVAR;  Surgeon: Everitt Amber, MD;  Location: [place];  Service: Gynecology;  Laterality: N/A;  . CO2 LASER APPLICATION N/A 5/42/7062   Procedure: CO2 LASER VAPORIZATION OF THE VULVA;  Surgeon: Everitt Amber, MD;  Location: [place];  Service: Gynecology;  Laterality:  N/A;  . CO2 LASER APPLICATION N/A 3/76/2831   Procedure: CO2 LASER APPLICATION OF VULVA;  Surgeon: Everitt Amber, MD;  Location: [place];  Service: Gynecology;  Laterality: N/A;  . COLONOSCOPY  01-28-2015  . CORONARY ARTERY BYPASS GRAFT N/A 11/25/2017   Procedure: CORONARY ARTERY BYPASS GRAFTING (CABG), ON PUMP, TIMES THREE, USING LEFT INTERNAL MAMMARY ARTERY AND ENDOSCOPICALLY HARVESTED LEFT GREATER SAPHENOUS VEIN;  Surgeon: Rexene Alberts, MD;  Location: [place];  Service: Open Heart Surgery;  Laterality: N/A;  . I & D LEFT BUTTOCK ABSCESS  04-28-2001  . LEFT HEART CATH AND CORONARY ANGIOGRAPHY N/A 11/22/2017   Procedure: LEFT HEART CATH AND CORONARY ANGIOGRAPHY;  Surgeon: Lorretta Harp, MD;  Location: [place] [facility];  Service: Cardiovascular;  Laterality: N/A;  . TEE WITHOUT CARDIOVERSION N/A 11/25/2017   Procedure: TRANSESOPHAGEAL ECHOCARDIOGRAM (TEE);  Surgeon: Rexene Alberts, MD;  Location: [place];  Service: Open Heart Surgery;  Laterality: N/A;  . VULVECTOMY Right 05/06/2015   Procedure: WIDE LOCAL  EXCISION  OF RIGHT VULVA;  Surgeon: Everitt Amber, MD;  Location: [place];  Service: Gynecology;  Laterality: Right;  Marland Kitchen VULVECTOMY N/A 05/04/2017   Procedure: WIDE EXCISION VULVECTOMY;  Surgeon: Everitt Amber, MD;  Location: [facility];  Service: Gynecology;  Laterality: N/A;  . VULVECTOMY N/A 12/27/2018   Procedure: WIDE  EXCISION VULVECTOMY;  Surgeon: Everitt Amber, MD;  Location: [facility];  Service: Gynecology;  Laterality: N/A;    There were no vitals filed for this visit.  Subjective Assessment - 03/11/20 1129    Subjective  relays 2/10 pain in her left leg today    Pertinent History  PMH: CAD,CABG,HIV,cerv fusion 2011    Limitations  House hold activities;Lifting;Standing;Walking    How long can you sit comfortably?  not limited    How long can you stand comfortably?  few minutes    How long can you walk comfortably?   down her driveway and back    Diagnostic tests  no recent imaging    Pain Onset  More than a month ago         [facility] Adult PT Treatment/Exercise - 03/11/20 0001      Lumbar Exercises: Stretches   Single Knee to Chest Stretch  Right;Left;2 reps;30 seconds    Hip Flexor Stretch Limitations  hip flexor/quad stretch supine with strap 30 sec X 2 bilat    Gastroc Stretch  3 reps;30 seconds;Right;Left    Gastroc Stretch Limitations  slantboard      Lumbar Exercises: Aerobic   Recumbent Bike  8 min L3      Lumbar Exercises: Machines for Strengthening   Leg Press  81 lbs bilat push 3X15      Lumbar Exercises: Standing   Heel Raises Limitations  heel toe raises X 15 ea    Other Standing Lumbar Exercises  Lateral walking with green band around knees 50 ft up/down X2     Other Standing Lumbar Exercises  step ups 6 inch step X 15 reps bilat one UE support, then lateral step up/overs X 10 bilat      Lumbar Exercises: Supine   Bridge  20 reps           PT Long Term Goals - 03/07/20 1240      PT LONG TERM GOAL #1   Title  Pt will be I and compliant with HEP. Target for all goals 6 weeks 03/19/20    Baseline  relays compliant with initial HEP but still needs come cuing with this    Status  On-going      PT LONG TERM GOAL #2   Title  Pt will reduce overall pain in her hips/legs to less than 4/10 on avg    Baseline  1/10 today    Status  Partially Met      PT LONG TERM GOAL #3   Title  Pt will improve bilat hip strength to at least 4+/5 in sitting    Baseline  4/5    Status  On-going      PT LONG TERM GOAL #4   Title  Pt will be able to stand for at least 30 minutes to perform ADLs    Baseline  standing time has improved to 20 minutes from 5 minutes initially    Status  On-going            Plan - 03/11/20 1142    Clinical Impression Statement  overall pain is more managed over the last 2 weeks it appears. Continued with strength/stretching program with progression of  resistance with good tolerance and without complaints.She still needs cuing for exercises but overall less cues required. Continue POC.    Personal Factors and Comorbidities  Comorbidity 3+    Comorbidities  PMH: CAD,CABG,HIV,cerv fusion 2011    Examination-Activity Limitations  Carry;Squat;Stairs;Lift;Stand  Examination-Participation Restrictions  Community Activity;Laundry;Meal Prep    Stability/Clinical Decision Making  Evolving/Moderate complexity    Rehab Potential  Good    PT Frequency  2x / week    PT Duration  6 weeks    PT Treatment/Interventions  ADLs/Self Care Home Management;Aquatic Therapy;Cryotherapy;Electrical Stimulation;Iontophoresis 12m/ml Dexamethasone;Moist Heat;Traction;Ultrasound;Gait training;Therapeutic activities;Therapeutic exercise;Neuromuscular re-education;Patient/family education;Manual techniques;Passive range of motion;Dry needling;Joint Manipulations;Spinal Manipulations;Taping    PT Next Visit Plan  hip and lumbar stretching and strength as tolerated    PT Home Exercise Plan  [id number]    Consulted and Agree with Plan of Care  Patient       Patient will benefit from skilled therapeutic intervention in order to improve the following deficits and impairments:  Abnormal gait, Decreased activity tolerance, Decreased endurance, Decreased range of motion, Decreased strength, Difficulty walking, Increased muscle spasms, Impaired flexibility, Pain  Visit Diagnosis: Pain in left hip  Acute low back pain, unspecified back pain laterality, unspecified whether sciatica present  Pain in right hip  Acute pain of right knee     Problem List Patient Active Problem List   Diagnosis Date Noted  . Claudication in peripheral vascular disease ([place] 03/01/2020  . Prediabetes 12/03/2019  . Chronic pain of lower extremity 12/03/2019  . Neuropathy 08/17/2019  . Suspected deep vein thrombosis (DVT) 04/09/2019  . AIN grade III 01/18/2019  . Left flank pain 12/14/2018   . Left leg pain 12/14/2018  . 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 LGSIL Pap smear of vagina 04/28/2017  . 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 Sinusitis 12/30/2015  . History of fusion of cervical spine 08/27/2015  . 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 Physical exam, annual 09/06/2014  . Alopecia of scalp 09/06/2014  . Hypertriglyceridemia without hypercholesterolemia 08/08/2014  . Dysuria 07/13/2012  . Essential hypertension 06/12/2009  . MONILIASIS, ORAL 09/24/2008  . HX, PERSONAL, PAST NONCOMPLIANCE 11/24/2006  . Human immunodeficiency virus (HIV) disease ([place] 09/15/2000  . HYSTERECTOMY, HX OF 11/10/1983    BSilvestre Mesi5/01/2020, 11:45 AM  [facility] [address] [place] [address]Phone: [phone]  Fax:  [phone] Name: Cathy WESTERFIELDMRN: [id number] of Birth: [birth year]

## 2020-03-11 NOTE — Progress Notes (Signed)
Hepatic  

## 2020-03-12 ENCOUNTER — Encounter: Payer: Self-pay | Admitting: Infectious Diseases

## 2020-03-12 ENCOUNTER — Telehealth (INDEPENDENT_AMBULATORY_CARE_PROVIDER_SITE_OTHER): Payer: Medicare HMO | Admitting: Infectious Diseases

## 2020-03-12 DIAGNOSIS — R748 Abnormal levels of other serum enzymes: Secondary | ICD-10-CM | POA: Diagnosis not present

## 2020-03-12 DIAGNOSIS — B20 Human immunodeficiency virus [HIV] disease: Secondary | ICD-10-CM

## 2020-03-12 DIAGNOSIS — M79605 Pain in left leg: Secondary | ICD-10-CM | POA: Diagnosis not present

## 2020-03-12 DIAGNOSIS — D071 Carcinoma in situ of vulva: Secondary | ICD-10-CM

## 2020-03-12 NOTE — Assessment & Plan Note (Signed)
I gave her the number for the GYN onc team to re-schedule her follow up. She will make the appointment.

## 2020-03-12 NOTE — Progress Notes (Signed)
Name: Cathy Smith  DOB: 12/24/1965 MRN: [id number] PCP: Azzie Glatter, FNP   Virtual Visit via MyChart Portal:  I connected with Cathy Smith on 03/12/20 at 10:15 AM EDT by MyChart portal and verified that I am speaking with the correct person using two identifiers.   I discussed the limitations, risks, security and privacy concerns of performing an evaluation and management service by telephone and the availability of in person appointments. I also discussed with the patient that there may be a patient responsible charge related to this service. The patient expressed understanding and agreed to proceed.  Patient Location - residence in [facility] Provider Location - [facility]    Patient Active Problem List   Diagnosis Date Noted  . Claudication in peripheral vascular disease ([place]) 03/01/2020  . Prediabetes 12/03/2019  . Chronic pain of lower extremity 12/03/2019  . Neuropathy 08/17/2019  . Suspected deep vein thrombosis (DVT) 04/09/2019  . AIN grade III 01/18/2019  . Left leg pain 12/14/2018  . 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 LGSIL Pap smear of vagina 04/28/2017  . 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 History of fusion of cervical spine 08/27/2015  . 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 Physical exam, annual 09/06/2014  . Alopecia of scalp 09/06/2014  . Hypertriglyceridemia without hypercholesterolemia 08/08/2014  . Essential hypertension 06/12/2009  . MONILIASIS, ORAL 09/24/2008  . HX, PERSONAL, PAST NONCOMPLIANCE 11/24/2006  . Human immunodeficiency virus (HIV) disease ([place]) 09/15/2000  . HYSTERECTOMY, HX OF 11/10/1983     Brief Narrative:  DEETRA BOOTON  is a 54 y.o. female with well controlled HIV disease, Dx 2005 per chart review. CD4 nadir 60 VL 12,300  HIV Risk: HIV+ husband, heterosexual History of OIs: vulvar cancer  Intake Labs 2014: Hep B sAg (not done), sAb (-), cAb (not done); Hep A (not done), Hep C (not done) Quantiferon () HLA B*5701  (-) G6PD: ()   Previous Regimens: Jorje Guild + Prezista   Genotypes: . 2008 - P14S, V35I, S68G, R83K, V106I,  [id number], [id number], [id number], T200I, L214F, E297K, D324E;  V3I, T4S, S37N, L63P  . 2009 - M184V  . 2010 - V106I  Cumulative Genotype:  RTI Resistance Mutations: M184V NNRTI Resistance Mutations: V106I  Nucleoside Reverse Transcriptase Inhibitors abacavir (ABC) Low-Level Resistance zidovudine (AZT) Susceptible emtricitabine (FTC) High-Level Resistance lamivudine (3TC) High-Level Resistance tenofovir (TDF) Susceptible  Non-nucleoside Reverse Transcriptase Inhibitors doravirine (DOR) Potential Low-Level Resistance efavirenz (EFV) Susceptible etravirine (ETR) Potential Low-Level Resistance nevirapine (NVP) Potential Low-Level Resistance rilpivirine (RPV) Potential Low-Level Resistance  PI Major Resistance Mutations: None PI Accessory Resistance Mutations: None  Protease Inhibitors atazanavir/r (ATV/r) Susceptible darunavir/r (DRV/r) Susceptible lopinavir/r (LPV/r) Susceptible  Subjective:  CC: HIV follow up care.  Aching joints - working with PT.    HPI: Cathy Smith is here for routine follow up care for HIV. She has had long-standing perfectly controlled HIV on Genvoya + Prezista with last VL < 20 and CD4 411 April 2021. She has no trouble with her medications and takes them every day with food as directed. She is married; her husband is HIV+ and in care with Dr. Megan Salon on ARVs.    The back part of the left leg at the calf was weak and causing some intermittent pains. She has been working with physical therapy recently. Her PCP increased the gabapentin which has helped a little. Her legs are worse when she sits for long periods. She finds that walking and bicycle helps. She also  likes to use the therapy bands and leg press. Awaiting ABIs on this leg per Dr. Gwenlyn Found to evaluate claudication.   She has a history of vulvovaginal dysplasia (VIN III). She still follows with her  gynocology team. Missed a follow up apt this year but cannot recall when.    Review of Systems  Constitutional: Negative for chills, diaphoresis, fever, malaise/fatigue and weight loss.  HENT: Negative for sore throat.   Cardiovascular: Negative for chest pain and leg swelling.  Gastrointestinal: Negative for abdominal pain, diarrhea, nausea and vomiting.  Genitourinary: Negative for dysuria.  Musculoskeletal: Negative for falls.       Leg pain as described above  Neurological: Positive for tingling (as described above). Negative for weakness.  Psychiatric/Behavioral: Negative for depression and substance abuse. The patient is not nervous/anxious and does not have insomnia.     Past Medical History:  Diagnosis Date  . Coronary artery disease   . History of condyloma acuminatum   . History of uterine fibroid   . History of vaginal dysplasia    VAIN III--  oncologist-  dr Denman George  . History of vulvar dysplasia    VIN III   gyn-oncologist-  dr Denman George  . HIV infection ([place]) [facility]  dr hatcher  . Hypertension   . Hypertriglyceridemia without hypercholesterolemia   . PONV (postoperative nausea and vomiting)   . VIN III (vulvar intraepithelial neoplasia III)   . Wears glasses     Outpatient Medications Prior to Visit  Medication Sig Dispense Refill  . acetaminophen (TYLENOL) 325 MG tablet Take 650 mg by mouth every 6 (six) hours as needed.    Marland Kitchen aspirin EC 325 MG EC tablet Take 1 tablet (325 mg total) by mouth daily. 30 tablet 0  . atorvastatin (LIPITOR) 20 MG tablet     . cyclobenzaprine (FLEXERIL) 10 MG tablet     . darunavir (PREZISTA) 800 MG tablet TAKE 1 TABLET(800 MG) BY MOUTH DAILY 30 tablet 5  . elvitegravir-cobicistat-emtricitabine-tenofovir (GENVOYA) 150-150-200-10 MG TABS tablet Take 1 tablet by mouth daily with breakfast. 30 tablet 5  . furosemide (LASIX) 40 MG tablet TAKE 1 TABLET(40 MG) BY MOUTH DAILY 90 tablet 3  . gabapentin (NEURONTIN)  300 MG capsule Take 1 capsule (300 mg total) by mouth 3 (three) times daily. 90 capsule 3  . metoprolol tartrate (LOPRESSOR) 25 MG tablet Take 1.5 tablets (37.5 mg total) by mouth 2 (two) times daily. 270 tablet 2   No facility-administered medications prior to visit.     No Known Allergies  Social History   Tobacco Use  . Smoking status: Never Smoker  . Smokeless tobacco: Never Used  Substance Use Topics  . Alcohol use: No    Alcohol/week: 0.0 standard drinks  . Drug use: No    Family History  Problem Relation Age of Onset  . Cataracts Mother   . Hypertension Father   . CAD Paternal Grandfather   . Colon cancer Neg Hx     Social History   Substance and Sexual Activity  Sexual Activity Yes  . Partners: Male  . Birth control/protection: Condom   Comment: pt. given condoms     Objective:   There were no vitals filed for this visit. There is no height or weight on file to calculate BMI.  Physical Exam Nursing note reviewed.  Constitutional:      Appearance: Normal appearance. She is not ill-appearing.  HENT:     Mouth/Throat:     Mouth:  Mucous membranes are moist.     Pharynx: Oropharynx is clear.  Eyes:     General: No scleral icterus. Pulmonary:     Effort: Pulmonary effort is normal.  Neurological:     Mental Status: She is oriented to person, place, and time.  Psychiatric:        Mood and Affect: Mood normal.        Thought Content: Thought content normal.     Lab Results Lab Results  Component Value Date   WBC 3.4 (L) 08/01/2019   HGB 12.9 08/01/2019   HCT 37.6 08/01/2019   MCV 96.9 08/01/2019   PLT 172 08/01/2019    Lab Results  Component Value Date   CREATININE 0.94 08/01/2019   BUN 12 08/01/2019   NA 142 08/01/2019   K 4.0 08/01/2019   CL 104 08/01/2019   CO2 27 08/01/2019    Lab Results  Component Value Date   ALT 58 (H) 03/01/2020   AST 52 (H) 03/01/2020   ALKPHOS 98 03/01/2020   BILITOT 0.4 03/01/2020    Lab Results    Component Value Date   CHOL 143 03/01/2020   HDL 44 03/01/2020   LDLCALC 69 03/01/2020   TRIG 175 (H) 03/01/2020   CHOLHDL 3.3 03/01/2020   HIV 1 RNA Quant (copies/mL)  Date Value  02/19/2020 <20 NOT DETECTED  08/01/2019 <20 NOT DETECTED  05/09/2018 <20 NOT DETECTED   CD4 T Cell Abs (/uL)  Date Value  02/19/2020 411  05/09/2018 300 (L)  01/27/2017 280 (L)     Assessment & Plan:   Problem List Items Addressed This Visit      Unprioritized   VIN III (vulvar intraepithelial neoplasia III)    I gave her the number for the GYN onc team to re-schedule her follow up. She will make the appointment.       Left leg pain    Ongoing aches and pains - currently being evaluated with ABI for claudication per Dr. Gwenlyn Found. Sounds like physical therapy is helping her overall.       Human immunodeficiency virus (HIV) disease ([place]) (Chronic)    Perfectly controlled on Genvoya and Prezista. With her LFTs intermittently elevated I do worry it may possibly be due to the cobicistat in her Genvoya but she is also on a statin.  Cardiology following and repeating in 3 months. If no other explaination identified I think we could consider changing her to [facility] +/- Fostemsivir. Based on cumulative genotype the Biktarvy would likely be enough for her treatment.  RTC 6 months  She has received the COVID vaccine and up to date for all others.  Has access to Dental care.  Vaginal pap smear / monitoring with GYN          Cathy Madeira, MSN, NP-C [place] [facility] [place] Pager: [phone] Office: [phone]  03/12/20  4:05 PM

## 2020-03-12 NOTE — Patient Instructions (Signed)
It was very nice to talk to you on the phone today.  I am glad to hear that you are staying well and keeping healthy.    Your medication is working perfectly for you and you are doing a great job taking it as prescribed  Please continue your Genvoya and Prezista once a day with food.   Magnesium Bath Salt Flakes - should help you relax and help with sore muscles in the bath tub!    Recommended Next Office Visit:   6 months   Please continue to be well, stay away from folks that are sick, wash her hands frequently, do not touch her face or mouth and continue to take your medications.

## 2020-03-12 NOTE — Assessment & Plan Note (Signed)
Ongoing aches and pains - currently being evaluated with ABI for claudication per Dr. Allyson Sabal. Sounds like physical therapy is helping her overall.

## 2020-03-12 NOTE — Assessment & Plan Note (Addendum)
Perfectly controlled on Genvoya and Prezista. With her LFTs intermittently elevated I do worry it may possibly be due to the cobicistat in her Genvoya but she is also on a statin.  Cardiology following and repeating in 3 months. If no other explaination identified I think we could consider changing her to [facility] +/- Fostemsivir. Based on cumulative genotype the Biktarvy would likely be enough for her treatment.  RTC 6 months  She has received the COVID vaccine and up to date for all others.  Has access to Dental care.  Vaginal pap smear / monitoring with GYN

## 2020-03-13 ENCOUNTER — Ambulatory Visit (INDEPENDENT_AMBULATORY_CARE_PROVIDER_SITE_OTHER): Payer: Medicare HMO | Admitting: Physical Therapy

## 2020-03-13 ENCOUNTER — Encounter: Payer: Self-pay | Admitting: Physical Therapy

## 2020-03-13 ENCOUNTER — Other Ambulatory Visit: Payer: Self-pay

## 2020-03-13 DIAGNOSIS — M25551 Pain in right hip: Secondary | ICD-10-CM

## 2020-03-13 DIAGNOSIS — M545 Low back pain, unspecified: Secondary | ICD-10-CM

## 2020-03-13 DIAGNOSIS — M25561 Pain in right knee: Secondary | ICD-10-CM | POA: Diagnosis not present

## 2020-03-13 DIAGNOSIS — M25552 Pain in left hip: Secondary | ICD-10-CM | POA: Diagnosis not present

## 2020-03-13 NOTE — Therapy (Signed)
[facility] Physical Therapy [address] [place], [place], [address] Phone: [phone]   Fax:  [phone]  Physical Therapy Treatment/Progress note Progress Note reporting period 02/06/20 to 03/13/20  See below for objective and subjective measurements relating to patients progress with PT.   Patient Details  Name: Cathy Smith MRN: [id number] Date of Birth: February 16, 1966 Referring Provider (PT): Hilts, MD   Encounter Date: 03/13/2020  PT End of Session - 03/13/20 1222    Visit Number  10    Number of Visits  12    Date for PT Re-Evaluation  03/19/20    Progress Note Due on Visit  20    PT Start Time  1145    PT Stop Time  1225    PT Time Calculation (min)  40 min    Activity Tolerance  Patient tolerated treatment well    Behavior During Therapy  [facility] for tasks assessed/performed       Past Medical History:  Diagnosis Date  . Coronary artery disease   . History of condyloma acuminatum   . History of uterine fibroid   . History of vaginal dysplasia    VAIN III--  oncologist-  dr Denman George  . History of vulvar dysplasia    VIN III   gyn-oncologist-  dr Denman George  . HIV infection ([place]) [facility]-  dr hatcher  . Hypertension   . Hypertriglyceridemia without hypercholesterolemia   . PONV (postoperative nausea and vomiting)   . VIN III (vulvar intraepithelial neoplasia III)   . Wears glasses     Past Surgical History:  Procedure Laterality Date  . ABDOMINAL HYSTERECTOMY  1995 approx  . ANTERIOR CERVICAL DECOMP/DISCECTOMY FUSION  11-30-2009   C4 -- C6  . CO2 LASER APPLICATION Bilateral 2/70/3500   Procedure: BILATERAL CO2 LASER ABLATION OF THE VULVA;  Surgeon: Everitt Amber, MD;  Location: [place];  Service: Gynecology;  Laterality: Bilateral;  . CO2 LASER APPLICATION N/A 9/38/1829   Procedure: CO2 LASER OF THE VULVAR;  Surgeon: Everitt Amber, MD;  Location: [place];  Service: Gynecology;  Laterality:  N/A;  . CO2 LASER APPLICATION N/A 9/37/1696   Procedure: CO2 LASER VAPORIZATION OF THE VULVA;  Surgeon: Everitt Amber, MD;  Location: [place];  Service: Gynecology;  Laterality: N/A;  . CO2 LASER APPLICATION N/A 7/89/3810   Procedure: CO2 LASER APPLICATION OF VULVA;  Surgeon: Everitt Amber, MD;  Location: [place];  Service: Gynecology;  Laterality: N/A;  . COLONOSCOPY  01-28-2015  . CORONARY ARTERY BYPASS GRAFT N/A 11/25/2017   Procedure: CORONARY ARTERY BYPASS GRAFTING (CABG), ON PUMP, TIMES THREE, USING LEFT INTERNAL MAMMARY ARTERY AND ENDOSCOPICALLY HARVESTED LEFT GREATER SAPHENOUS VEIN;  Surgeon: Rexene Alberts, MD;  Location: [place];  Service: Open Heart Surgery;  Laterality: N/A;  . I & D LEFT BUTTOCK ABSCESS  04-28-2001  . LEFT HEART CATH AND CORONARY ANGIOGRAPHY N/A 11/22/2017   Procedure: LEFT HEART CATH AND CORONARY ANGIOGRAPHY;  Surgeon: Lorretta Harp, MD;  Location: [place] [facility];  Service: Cardiovascular;  Laterality: N/A;  . TEE WITHOUT CARDIOVERSION N/A 11/25/2017   Procedure: TRANSESOPHAGEAL ECHOCARDIOGRAM (TEE);  Surgeon: Rexene Alberts, MD;  Location: [place];  Service: Open Heart Surgery;  Laterality: N/A;  . VULVECTOMY Right 05/06/2015   Procedure: WIDE LOCAL  EXCISION  OF RIGHT VULVA;  Surgeon: Everitt Amber, MD;  Location: [place];  Service: Gynecology;  Laterality: Right;  Marland Kitchen VULVECTOMY N/A  05/04/2017   Procedure: WIDE EXCISION VULVECTOMY;  Surgeon: Everitt Amber, MD;  Location: [facility];  Service: Gynecology;  Laterality: N/A;  . VULVECTOMY N/A 12/27/2018   Procedure: WIDE EXCISION VULVECTOMY;  Surgeon: Everitt Amber, MD;  Location: [facility];  Service: Gynecology;  Laterality: N/A;    There were no vitals filed for this visit.  Subjective Assessment - 03/13/20 1214    Subjective  relays she had about 6/10  pain in her left leg upon arrival that aggravated her when she was trying to  make up bed. The then relays the pain goes away after riding the bike. She feels she has improved >90% with PT    Pertinent History  PMH: CAD,CABG,HIV,cerv fusion 2011    Limitations  House hold activities;Lifting;Standing;Walking    How long can you sit comfortably?  not limited    How long can you stand comfortably?  few minutes    How long can you walk comfortably?  down her driveway and back    Diagnostic tests  no recent imaging    Pain Onset  More than a month ago         [facility] PT Assessment - 03/13/20 0001      Assessment   Medical Diagnosis  bilat leg pain, low back pain    Referring Provider (PT)  Hilts, MD      Strength   Overall Strength Comments  bilat hip flexion 4/5, hip abd 4/5, knee strength 5/5, ankle DF 4/5, ankle PF 5/5                   [facility] Adult PT Treatment/Exercise - 03/13/20 0001      Lumbar Exercises: Stretches   Active Hamstring Stretch  Right;Left;2 reps;30 seconds    Active Hamstring Stretch Limitations  seated    Single Knee to Chest Stretch  Right;Left;2 reps;30 seconds    Hip Flexor Stretch Limitations  hip flexor/quad stretch supine with strap 30 sec X 2 bilat    Gastroc Stretch  3 reps;30 seconds;Right;Left    Gastroc Stretch Limitations  slantboard      Lumbar Exercises: Aerobic   Recumbent Bike  8 min L3      Lumbar Exercises: Machines for Strengthening   Leg Press  81 lbs bilat push 3X15      Lumbar Exercises: Standing   Heel Raises Limitations  heel toe raises X 15 ea    Other Standing Lumbar Exercises  Lateral walking with green band around knees 50 ft up/down X2     Other Standing Lumbar Exercises  step ups 6 inch step X 15 reps bilat one UE support, then lateral step up/overs X 10 bilat                  PT Long Term Goals - 03/13/20 1223      PT LONG TERM GOAL #1   Title  Pt will be I and compliant with HEP. Target for all goals 6 weeks 03/19/20    Baseline  relays compliant with initial HEP but still  needs come cuing with this    Status  On-going      PT LONG TERM GOAL #2   Title  Pt will reduce overall pain in her hips/legs to less than 4/10 on avg    Baseline  normally has met this but today arrived 6/10 pain which resolved during session    Status  Partially Met  PT LONG TERM GOAL #3   Title  Pt will improve bilat hip strength to at least 4+/5 in sitting    Baseline  now 4+    Status  Achieved      PT LONG TERM GOAL #4   Title  Pt will be able to stand for at least 30 minutes to perform ADLs    Baseline  able to do this in PT    Status  Achieved            Plan - 03/13/20 1234    Clinical Impression Statement  Progress note shows improvements in overall pain levels, activity tolerance, lumbar ROM, and leg strength. She has met 2/4 PT goals. She feels she will be ready to discharge after her 2 remaining authorized visits. PT will update HEP and go over this with her in last 2 visits.    Comorbidities  PMH: CAD,CABG,HIV,cerv fusion 2011    PT Frequency  2x / week    PT Duration  6 weeks    PT Treatment/Interventions  ADLs/Self Care Home Management;Aquatic Therapy;Cryotherapy;Electrical Stimulation;Iontophoresis 53m/ml Dexamethasone;Moist Heat;Traction;Ultrasound;Gait training;Therapeutic activities;Therapeutic exercise;Neuromuscular re-education;Patient/family education;Manual techniques;Passive range of motion;Dry needling;Joint Manipulations;Spinal Manipulations;Taping    PT Next Visit Plan  update final HEP    PT Home Exercise Plan  [id number]    Consulted and Agree with Plan of Care  Patient       Patient will benefit from skilled therapeutic intervention in order to improve the following deficits and impairments:  Abnormal gait, Decreased activity tolerance, Decreased endurance, Decreased range of motion, Decreased strength, Difficulty walking, Increased muscle spasms, Impaired flexibility, Pain  Visit Diagnosis: Pain in left hip  Acute low back pain,  unspecified back pain laterality, unspecified whether sciatica present  Pain in right hip  Acute pain of right knee     Problem List Patient Active Problem List   Diagnosis Date Noted  . Claudication in peripheral vascular disease [place] 03/01/2020  . Prediabetes 12/03/2019  . Chronic pain of lower extremity 12/03/2019  . Neuropathy 08/17/2019  . Suspected deep vein thrombosis (DVT) 04/09/2019  . AIN grade III 01/18/2019  . Left leg pain 12/14/2018  . 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 LGSIL Pap smear of vagina 04/28/2017  . 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 History of fusion of cervical spine 08/27/2015  . 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 Physical exam, annual 09/06/2014  . Alopecia of scalp 09/06/2014  . Hypertriglyceridemia without hypercholesterolemia 08/08/2014  . Essential hypertension 06/12/2009  . MONILIASIS, ORAL 09/24/2008  . HX, PERSONAL, PAST NONCOMPLIANCE 11/24/2006  . Human immunodeficiency virus (HIV) disease [place] 09/15/2000  . HYSTERECTOMY, HX OF 11/10/1983    BSilvestre Mesi5/03/2020, 12:37 PM  [facility] [address] [place] [address]Phone: [phone]  Fax:  [phone] Name: RJANECE LAIDLAWMRN: [id number] of Birth: [birth year]

## 2020-03-15 ENCOUNTER — Other Ambulatory Visit: Payer: Self-pay

## 2020-03-15 ENCOUNTER — Ambulatory Visit (HOSPITAL_COMMUNITY)
Admission: RE | Admit: 2020-03-15 | Discharge: 2020-03-15 | Disposition: A | Discharge status: Home / Self Care | Payer: Medicare HMO | Source: Ambulatory Visit | Attending: Cardiology | Admitting: Cardiology

## 2020-03-15 DIAGNOSIS — I739 Peripheral vascular disease, unspecified: Secondary | ICD-10-CM | POA: Insufficient documentation

## 2020-03-21 ENCOUNTER — Other Ambulatory Visit: Payer: Self-pay

## 2020-03-21 ENCOUNTER — Ambulatory Visit (INDEPENDENT_AMBULATORY_CARE_PROVIDER_SITE_OTHER): Payer: Medicare HMO | Admitting: Physical Therapy

## 2020-03-21 DIAGNOSIS — M25561 Pain in right knee: Secondary | ICD-10-CM

## 2020-03-21 DIAGNOSIS — M25552 Pain in left hip: Secondary | ICD-10-CM | POA: Diagnosis not present

## 2020-03-21 DIAGNOSIS — M25551 Pain in right hip: Secondary | ICD-10-CM | POA: Diagnosis not present

## 2020-03-21 DIAGNOSIS — M545 Low back pain, unspecified: Secondary | ICD-10-CM

## 2020-03-21 NOTE — Therapy (Addendum)
[facility] [address] [place], [place], [address] Phone: [phone]   Fax:  [phone]  Physical Therapy Treatment/Discharge addendum PHYSICAL THERAPY DISCHARGE SUMMARY  Visits from Start of Care: 11  Current functional level related to goals / functional outcomes: See below   Remaining deficits: See below   Education / Equipment: HEP  Plan: Patient agrees to discharge.  Patient goals were partially met. Patient is being discharged due to                                                     ?????  Did not have anymore authorized visit time period. And She aggrees to discharge to HEP which was emailed to her. Elsie Ra, PT, DPT 04/17/20 11:07 AM      Patient Details  Name: Cathy Smith MRN: [id number] Date of Birth: [birth year] Referring Provider (PT): Hilts, MD   Encounter Date: 03/21/2020  PT End of Session - 03/21/20 1112    Visit Number  11    Number of Visits  12    Date for PT Re-Evaluation  03/19/20    Progress Note Due on Visit  20    PT Start Time  1025    PT Stop Time  1105    PT Time Calculation (min)  40 min    Activity Tolerance  Patient tolerated treatment well    Behavior During Therapy  [facility] : [facility] for tasks assessed/performed       Past Medical History:  Diagnosis Date  . Coronary artery disease   . History of condyloma acuminatum   . History of uterine fibroid   . History of vaginal dysplasia    VAIN III--  oncologist-  dr Denman George  . History of vulvar dysplasia    VIN III   gyn-oncologist-  dr Denman George  . HIV infection ([place]) [facility]-  dr hatcher  . Hypertension   . Hypertriglyceridemia without hypercholesterolemia   . PONV (postoperative nausea and vomiting)   . VIN III (vulvar intraepithelial neoplasia III)   . Wears glasses     Past Surgical History:  Procedure Laterality Date  . ABDOMINAL HYSTERECTOMY  1995 approx  . ANTERIOR CERVICAL DECOMP/DISCECTOMY FUSION  11-30-2009    C4 -- C6  . CO2 LASER APPLICATION Bilateral 1/75/1025   Procedure: BILATERAL CO2 LASER ABLATION OF THE VULVA;  Surgeon: Everitt Amber, MD;  Location: [place];  Service: Gynecology;  Laterality: Bilateral;  . CO2 LASER APPLICATION N/A 8/52/7782   Procedure: CO2 LASER OF THE VULVAR;  Surgeon: Everitt Amber, MD;  Location: [place];  Service: Gynecology;  Laterality: N/A;  . CO2 LASER APPLICATION N/A 03/01/5360   Procedure: CO2 LASER VAPORIZATION OF THE VULVA;  Surgeon: Everitt Amber, MD;  Location: [place];  Service: Gynecology;  Laterality: N/A;  . CO2 LASER APPLICATION N/A 4/43/1540   Procedure: CO2 LASER APPLICATION OF VULVA;  Surgeon: Everitt Amber, MD;  Location: [place];  Service: Gynecology;  Laterality: N/A;  . COLONOSCOPY  01-28-2015  . CORONARY ARTERY BYPASS GRAFT N/A 11/25/2017   Procedure: CORONARY ARTERY BYPASS GRAFTING (CABG), ON PUMP, TIMES THREE, USING LEFT INTERNAL MAMMARY ARTERY AND ENDOSCOPICALLY HARVESTED LEFT GREATER SAPHENOUS VEIN;  Surgeon: Rexene Alberts, MD;  Location: [place];  Service: Open Heart Surgery;  Laterality:  N/A;  . I & D LEFT BUTTOCK ABSCESS  04-28-2001  . LEFT HEART CATH AND CORONARY ANGIOGRAPHY N/A 11/22/2017   Procedure: LEFT HEART CATH AND CORONARY ANGIOGRAPHY;  Surgeon: Lorretta Harp, MD;  Location: [place] CV LAB;  Service: Cardiovascular;  Laterality: N/A;  . TEE WITHOUT CARDIOVERSION N/A 11/25/2017   Procedure: TRANSESOPHAGEAL ECHOCARDIOGRAM (TEE);  Surgeon: Rexene Alberts, MD;  Location: [place];  Service: Open Heart Surgery;  Laterality: N/A;  . VULVECTOMY Right 05/06/2015   Procedure: WIDE LOCAL  EXCISION  OF RIGHT VULVA;  Surgeon: Everitt Amber, MD;  Location: [place];  Service: Gynecology;  Laterality: Right;  Marland Kitchen VULVECTOMY N/A 05/04/2017   Procedure: WIDE EXCISION VULVECTOMY;  Surgeon: Everitt Amber, MD;  Location: [facility];  Service: Gynecology;   Laterality: N/A;  . VULVECTOMY N/A 12/27/2018   Procedure: WIDE EXCISION VULVECTOMY;  Surgeon: Everitt Amber, MD;  Location: [facility];  Service: Gynecology;  Laterality: N/A;    There were no vitals filed for this visit.  Subjective Assessment - 03/21/20 1120    Subjective  She relays pain overall better today. PT recommending discharge next visit as POC is up and she is in aggreement    Pertinent History  PMH: CAD,CABG,HIV,cerv fusion 2011    Limitations  House hold activities;Lifting;Standing;Walking    How long can you sit comfortably?  not limited    How long can you stand comfortably?  few minutes    How long can you walk comfortably?  down her driveway and back    Diagnostic tests  no recent imaging    Pain Onset  More than a month ago       [facility] Adult PT Treatment/Exercise - 03/21/20 0001      Lumbar Exercises: Stretches   Active Hamstring Stretch  Right;Left;2 reps;30 seconds    Active Hamstring Stretch Limitations  seated    Hip Flexor Stretch Limitations  quad stretch standing 30 sec X 2 bilat    Gastroc Stretch  3 reps;30 seconds;Right;Left    Gastroc Stretch Limitations  slantboard      Lumbar Exercises: Aerobic   Nustep  L6 X 8 min LE/UE      Lumbar Exercises: Machines for Strengthening   Leg Press  87 lbs bilat push 3X15      Lumbar Exercises: Standing   Heel Raises Limitations  heel toe raises X 15 ea    Other Standing Lumbar Exercises  Lateral walking with green band around knees 50 ft up/down X2     Other Standing Lumbar Exercises  standing hip abd and hamstring curls 3 lbs X 15 bilat      Lumbar Exercises: Seated   Long Arc Quad on Chair  Both    LAQ on Chair Weights (lbs)  3    LAQ on Chair Limitations  20 reps bilat                  PT Long Term Goals - 03/13/20 1223      PT LONG TERM GOAL #1   Title  Pt will be I and compliant with HEP. Target for all goals 6 weeks 03/19/20    Baseline  relays compliant with initial HEP  but still needs come cuing with this    Status  On-going      PT LONG TERM GOAL #2   Title  Pt will reduce overall pain in her hips/legs to less than 4/10 on avg  Baseline  normally has met this but today arrived 6/10 pain which resolved during session    Status  Partially Met      PT LONG TERM GOAL #3   Title  Pt will improve bilat hip strength to at least 4+/5 in sitting    Baseline  now 4+    Status  Achieved      PT LONG TERM GOAL #4   Title  Pt will be able to stand for at least 30 minutes to perform ADLs    Baseline  able to do this in PT    Status  Achieved            Plan - 03/21/20 1113    Clinical Impression Statement  Overall doing well with PT. WIll plan to discharge next visit to HEP, will take final measurements and update goals then.    Comorbidities  PMH: CAD,CABG,HIV,cerv fusion 2011    PT Frequency  2x / week    PT Duration  6 weeks    PT Treatment/Interventions  ADLs/Self Care Home Management;Aquatic Therapy;Cryotherapy;Electrical Stimulation;Iontophoresis 38m/ml Dexamethasone;Moist Heat;Traction;Ultrasound;Gait training;Therapeutic activities;Therapeutic exercise;Neuromuscular re-education;Patient/family education;Manual techniques;Passive range of motion;Dry needling;Joint Manipulations;Spinal Manipulations;Taping    PT Next Visit Plan  update final HEP and discharge    PT Home Exercise Plan  [id number]    Consulted and Agree with Plan of Care  Patient       Patient will benefit from skilled therapeutic intervention in order to improve the following deficits and impairments:  Abnormal gait, Decreased activity tolerance, Decreased endurance, Decreased range of motion, Decreased strength, Difficulty walking, Increased muscle spasms, Impaired flexibility, Pain  Visit Diagnosis: Pain in left hip  Acute low back pain, unspecified back pain laterality, unspecified whether sciatica present  Pain in right hip  Acute pain of right knee     Problem  List Patient Active Problem List   Diagnosis Date Noted  . Claudication in peripheral vascular disease ([place] 03/01/2020  . Prediabetes 12/03/2019  . Chronic pain of lower extremity 12/03/2019  . Neuropathy 08/17/2019  . Suspected deep vein thrombosis (DVT) 04/09/2019  . AIN grade III 01/18/2019  . Left leg pain 12/14/2018  . 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 LGSIL Pap smear of vagina 04/28/2017  . 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 History of fusion of cervical spine 08/27/2015  . 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 Physical exam, annual 09/06/2014  . Alopecia of scalp 09/06/2014  . Hypertriglyceridemia without hypercholesterolemia 08/08/2014  . Essential hypertension 06/12/2009  . MONILIASIS, ORAL 09/24/2008  . HX, PERSONAL, PAST NONCOMPLIANCE 11/24/2006  . Human immunodeficiency virus (HIV) disease ([place] 09/15/2000  . HYSTERECTOMY, HX OF 11/10/1983    BSilvestre Mesi5/13/2021, 1[facility] [address] [place] [address]Phone: [phone]  Fax:  [phone] Name: Cathy SOULEMRN: [id number] of Birth: 109/06/1966

## 2020-03-22 ENCOUNTER — Encounter: Payer: Medicare HMO | Admitting: Physical Therapy

## 2020-04-03 ENCOUNTER — Encounter: Payer: Self-pay | Admitting: Gynecologic Oncology

## 2020-04-03 ENCOUNTER — Inpatient Hospital Stay: Payer: Medicare HMO | Attending: Gynecologic Oncology | Admitting: Gynecologic Oncology

## 2020-04-03 ENCOUNTER — Other Ambulatory Visit: Payer: Self-pay

## 2020-04-03 ENCOUNTER — Other Ambulatory Visit (HOSPITAL_COMMUNITY)
Admission: RE | Admit: 2020-04-03 | Discharge: 2020-04-03 | Disposition: A | Discharge status: Home / Self Care | Payer: Medicare HMO | Source: Ambulatory Visit | Attending: Gynecologic Oncology | Admitting: Gynecologic Oncology

## 2020-04-03 ENCOUNTER — Other Ambulatory Visit: Payer: Self-pay | Admitting: Infectious Diseases

## 2020-04-03 VITALS — BP 135/85 | HR 73 | Temp 98.4°F | Resp 17 | Ht 62.0 in | Wt 160.4 lb

## 2020-04-03 DIAGNOSIS — I2510 Atherosclerotic heart disease of native coronary artery without angina pectoris: Secondary | ICD-10-CM | POA: Insufficient documentation

## 2020-04-03 DIAGNOSIS — Z7982 Long term (current) use of aspirin: Secondary | ICD-10-CM | POA: Diagnosis not present

## 2020-04-03 DIAGNOSIS — Z1272 Encounter for screening for malignant neoplasm of vagina: Secondary | ICD-10-CM | POA: Insufficient documentation

## 2020-04-03 DIAGNOSIS — E781 Pure hyperglyceridemia: Secondary | ICD-10-CM | POA: Insufficient documentation

## 2020-04-03 DIAGNOSIS — R87620 Atypical squamous cells of undetermined significance on cytologic smear of vagina (ASC-US): Secondary | ICD-10-CM | POA: Diagnosis not present

## 2020-04-03 DIAGNOSIS — D071 Carcinoma in situ of vulva: Secondary | ICD-10-CM | POA: Diagnosis present

## 2020-04-03 DIAGNOSIS — Z90710 Acquired absence of both cervix and uterus: Secondary | ICD-10-CM | POA: Insufficient documentation

## 2020-04-03 DIAGNOSIS — Z8249 Family history of ischemic heart disease and other diseases of the circulatory system: Secondary | ICD-10-CM | POA: Insufficient documentation

## 2020-04-03 DIAGNOSIS — I10 Essential (primary) hypertension: Secondary | ICD-10-CM | POA: Insufficient documentation

## 2020-04-03 DIAGNOSIS — B20 Human immunodeficiency virus [HIV] disease: Secondary | ICD-10-CM

## 2020-04-03 DIAGNOSIS — Z79899 Other long term (current) drug therapy: Secondary | ICD-10-CM | POA: Insufficient documentation

## 2020-04-03 DIAGNOSIS — R87811 Vaginal high risk human papillomavirus (HPV) DNA test positive: Secondary | ICD-10-CM | POA: Insufficient documentation

## 2020-04-03 NOTE — Patient Instructions (Signed)
Please notify Dr Andrey Farmer at phone number [phone] if you notice vaginal bleeding, new pelvic or abdominal pains, bloating, feeling full easy, or a change in bladder or bowel function.   Dr Oliver Hum office will call you with your pap test result.  If the test result is low grade changes, you can return in 1 year for a repeat pap test.

## 2020-04-03 NOTE — Progress Notes (Signed)
Followup Note: Gyn-Onc   Cathy Smith 54 y.o. female  Chief Complaint  Patient presents with  . 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 S/p CO2 laser of vulva (circumferential, clitoris) with right WLE in February, 2020 for VIN 3 and AIN3. Persistent LGSIL pap, high risk HPV infection, persistent VIN.  Plan:   Repeat Pap in May, 2022.   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 It is not realistic to expect that she will become disease free and therefore we will continue regular surveillance and biopsy of any more concerning areas.   Interval History Pap 12/13/18 was stable with LGSIL and positive high risk HPV present.   She has no specific concerns today and returns for annual pap.   HPI: 54 year old African American female seen in consultation requested of [name], and [name] regarding management of a newly diagnosed severe dysplasia of the upper vagina. Briefly, the patient's history includes having a past history of abdominal hysterectomy for what we understand and have been fibroids. She denies any problems with Pap smears prior to the hysterectomy. More recently she has had some abnormal Pap smears ultimately resulting in colposcopy and directed biopsy on 04/07/2012. 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  The patient did well until March 2015 with a Pap smear showing high-grade dysplasia once again. In April and May 2015 the patient received 2 additional months of Efudex. From June 2015 through February 2016 her Pap smears only showed low-grade SIL.  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 She was scheduled for a  wide local excision of the right anterior vulva which was performed on 05/06/15 and revealed VIN3. Margins were positive. Intraoperative evaluation showed lesions consistent with VIN3 on bilateral posterior labia. These were biopsied (but not treated at the patient's request). These biopsies also returned as positive for VIN3.  On 07/23/15 she was taken to the OR for an extensive CO2 laser ablation of the circumferential labia minora and majora to ablate all acetowhite areas. No pathology was taken. The clitoris was spared.   Her VIN 3 recurred and on 02/25/16 she returned to the OR for CO2 laser which included the anterior vulva and clitoris.  Pap in March, 2018 showed LGSIL Pap in March 2019 showed LGSIL , high risk HPV present.   Her last Pap smear in March 2018 showed LGSIL + hrHPV.   She had open heart surgery in 2019. Since that time she has exhibited increased confusion and difficulty following instructions.   She underwent WLE right vulva and CO2 laser of anterior vulva on 05/04/17. She has no complaints. Biopsies on February, 2020 of the anterior and posterior vulva showed VIN 3.  She was taken to the OR on 12/27/18 for a wide local excision of the anterior and posterior vulva and CO2 laser of the periclitoral tissues. 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 All lesions showed VIN 3/AIN 3.   Review of Systems:10 point review of systems is negative as noted above.   Vitals: Blood pressure 135/85, pulse 73, temperature 98.4 F (36.9 C), temperature source Oral, resp. rate 17,  height 5\' 2"  (1.575 m), weight 160 lb 6.4 oz (72.8 kg), SpO2 97 %.  Physical Exam: General : The patient is a healthy woman in no acute distress.  HEENT: normocephalic, extraoccular movements normal; neck is supple without thyromegally  Lynphnodes: Supraclavicular and inguinal nodes not enlarged  Abdomen: Soft, non-tender, no ascites, no organomegally, no masses,  no hernias  Pelvic:  Acetic acid applied to vulva. Mild leukoplakia on the left sided undersurface of the clitoris and the left anterior labia minora. Normal vaginal tissues. Pap taken.  Vagina: Normal, no lesions. Pap taken.   Urethra and Bladder: Normal, non-tender  Cervix: Surgically absent  Uterus: Surgically absent  Bi-manual examination: Non-tender; no adenxal masses or nodularity  Rectal: normal sphincter tone, no masses, no blood  Lower extremities: No edema or varicosities. Normal range of motion      No Known Allergies  Past Medical History:  Diagnosis Date  . Coronary artery disease   . History of condyloma acuminatum   . History of uterine fibroid   . History of vaginal dysplasia    VAIN III--  oncologist-  dr  . History of vulvar dysplasia    VIN III   gyn-oncologist-  dr Andrey Farmer  . HIV infection ([facility]) [facility]-  dr hatcher  . Hypertension   . Hypertriglyceridemia without hypercholesterolemia   . PONV (postoperative nausea and vomiting)   . VIN III (vulvar intraepithelial neoplasia III)   . Wears glasses     Past Surgical History:  Procedure Laterality Date  . ABDOMINAL HYSTERECTOMY  1995 approx  . ANTERIOR CERVICAL DECOMP/DISCECTOMY FUSION  11-30-2009   C4 -- C6  . CO2 LASER APPLICATION Bilateral 07/23/2015   Procedure: BILATERAL CO2 LASER ABLATION OF THE VULVA;  Surgeon: 07/25/2015, MD;  Location: [facility];  Service: Gynecology;  Laterality: Bilateral;  . CO2 LASER APPLICATION N/A 02/25/2016   Procedure: CO2 LASER OF THE VULVAR;  Surgeon: 02/27/2016, MD;  Location: [facility] [place];  Service: Gynecology;  Laterality: N/A;  . CO2 LASER APPLICATION N/A 05/04/2017   Procedure: CO2 LASER VAPORIZATION OF THE VULVA;  Surgeon: 05/06/2017, MD;  Location: [place];  Service: Gynecology;  Laterality: N/A;  . CO2 LASER APPLICATION N/A 12/27/2018   Procedure: CO2 LASER APPLICATION OF VULVA;   Surgeon: 12/29/2018, MD;  Location: [facility];  Service: Gynecology;  Laterality: N/A;  . COLONOSCOPY  01-28-2015  . CORONARY ARTERY BYPASS GRAFT N/A 11/25/2017   Procedure: CORONARY ARTERY BYPASS GRAFTING (CABG), ON PUMP, TIMES THREE, USING LEFT INTERNAL MAMMARY ARTERY AND ENDOSCOPICALLY HARVESTED LEFT GREATER SAPHENOUS VEIN;  Surgeon: 11/27/2017, MD;  Location: [facility];  Service: Open Heart Surgery;  Laterality: N/A;  . I & D LEFT BUTTOCK ABSCESS  04-28-2001  . LEFT HEART CATH AND CORONARY ANGIOGRAPHY N/A 11/22/2017   Procedure: LEFT HEART CATH AND CORONARY ANGIOGRAPHY;  Surgeon: 11/24/2017, MD;  [facility]: [facility];  Service: Cardiovascular;  Laterality: N/A;  . TEE WITHOUT CARDIOVERSION N/A 11/25/2017   Procedure: TRANSESOPHAGEAL ECHOCARDIOGRAM (TEE);  Surgeon: 11/27/2017, MD;  Location: [facility];  Service: Open Heart Surgery;  Laterality: N/A;  . VULVECTOMY Right 05/06/2015   Procedure: WIDE LOCAL  EXCISION  OF RIGHT VULVA;  Surgeon: 05/08/2015, MD;  Location: [facility];  Service: Gynecology;  Laterality: Right;  [facility] VULVECTOMY N/A 05/04/2017   Procedure: WIDE EXCISION VULVECTOMY;  Surgeon: 05/06/2017, MD;  Location: [place];  Service: Gynecology;  Laterality: N/A;  . VULVECTOMY N/A 12/27/2018   Procedure: WIDE EXCISION VULVECTOMY;  Surgeon: Everitt Amber, MD;  Location: [facility];  Service: Gynecology;  Laterality: N/A;    Current Outpatient Medications  Medication Sig Dispense Refill  . acetaminophen (TYLENOL) 325 MG tablet Take 650 mg by mouth every 6 (six) hours as needed.    Marland Kitchen aspirin EC 325 MG EC tablet Take 1 tablet (325 mg total) by mouth daily. 30 tablet 0  . atorvastatin (LIPITOR) 20 MG tablet     . cyclobenzaprine (FLEXERIL) 10 MG tablet     . furosemide (LASIX) 40 MG tablet TAKE 1 TABLET(40 MG) BY MOUTH DAILY 90 tablet 3  . gabapentin (NEURONTIN) 300 MG capsule Take 1 capsule (300 mg total)  by mouth 3 (three) times daily. 90 capsule 3  . GENVOYA 150-150-200-10 MG TABS tablet TAKE 1 TABLET BY MOUTH DAILY WITH BREAKFAST 30 tablet 5  . metoprolol tartrate (LOPRESSOR) 25 MG tablet Take 1.5 tablets (37.5 mg total) by mouth 2 (two) times daily. 270 tablet 2  . PREZISTA 800 MG tablet TAKE 1 TABLET(800 MG) BY MOUTH DAILY 30 tablet 5   No current facility-administered medications for this visit.    Social History   Socioeconomic History  . Marital status: Married    Spouse name: Not on file  . Number of children: Not on file  . Years of education: Not on file  . Highest education level: Not on file  Occupational History  . Not on file  Tobacco Use  . Smoking status: Never Smoker  . Smokeless tobacco: Never Used  Substance and Sexual Activity  . Alcohol use: No    Alcohol/week: 0.0 standard drinks  . Drug use: No  . Sexual activity: Yes    Partners: Male    Birth control/protection: Condom    Comment: pt. given condoms  Other Topics Concern  . Not on file  Social History Narrative  . Not on file   Social Determinants of Health   Financial Resource Strain:   . Difficulty of Paying Living Expenses:   Food Insecurity:   . Worried About Charity fundraiser in the Last Year:   . Arboriculturist in the Last Year:   Transportation Needs:   . Film/video editor (Medical):   Marland Kitchen Lack of Transportation (Non-Medical):   Physical Activity:   . Days of Exercise per Week:   . Minutes of Exercise per Session:   Stress:   . Feeling of Stress :   Social Connections:   . Frequency of Communication with Friends and Family:   . Frequency of Social Gatherings with Friends and Family:   . Attends Religious Services:   . Active Member of Clubs or Organizations:   . Attends Archivist Meetings:   Marland Kitchen Marital Status:   Intimate Partner Violence:   . Fear of Current or Ex-Partner:   . Emotionally Abused:   Marland Kitchen Physically Abused:   . Sexually Abused:     Family History   Problem Relation Age of Onset  . Cataracts Mother   . Hypertension Father   . CAD Paternal Grandfather   . Colon cancer Neg Hx    Thereasa Solo, MD 04/03/2020, 4:02 PM

## 2020-04-04 DIAGNOSIS — R87610 Atypical squamous cells of undetermined significance on cytologic smear of cervix (ASC-US): Secondary | ICD-10-CM | POA: Diagnosis not present

## 2020-04-05 LAB — CYTOLOGY - PAP
Comment: NEGATIVE
Diagnosis: UNDETERMINED — AB
High risk HPV: POSITIVE — AB

## 2020-04-08 ENCOUNTER — Encounter: Payer: Self-pay | Admitting: Gynecologic Oncology

## 2020-04-09 ENCOUNTER — Telehealth: Payer: Self-pay | Admitting: *Deleted

## 2020-04-09 NOTE — Telephone Encounter (Signed)
Told Cathy Smith that the High Risk HPV has been present on pap smears in 2019 through 2021.  The HPV stays in her system and that is why she has yearly pap smears to watch for pre cancerous changes in the cervix.  The pap smear from 04-03-20 showed atypical cells but no pre-cancer changes. She need to call the office in January of 2022 to schedule an appointment with Dr. Andrey Farmer for a repeat pap smear in May of 2022. Pt verbalized understanding.

## 2020-04-09 NOTE — Telephone Encounter (Signed)
Patient called and left a message asking for a call back regarding her test results

## 2020-04-29 ENCOUNTER — Other Ambulatory Visit: Payer: Self-pay | Admitting: Family Medicine

## 2020-04-29 DIAGNOSIS — Z1231 Encounter for screening mammogram for malignant neoplasm of breast: Secondary | ICD-10-CM

## 2020-05-02 ENCOUNTER — Other Ambulatory Visit: Payer: Self-pay | Admitting: Cardiovascular Disease

## 2020-05-30 ENCOUNTER — Other Ambulatory Visit: Payer: Self-pay | Admitting: Family Medicine

## 2020-05-30 DIAGNOSIS — G629 Polyneuropathy, unspecified: Secondary | ICD-10-CM

## 2020-05-30 DIAGNOSIS — M79604 Pain in right leg: Secondary | ICD-10-CM

## 2020-05-31 ENCOUNTER — Ambulatory Visit (INDEPENDENT_AMBULATORY_CARE_PROVIDER_SITE_OTHER): Payer: Medicare HMO | Admitting: Family Medicine

## 2020-05-31 ENCOUNTER — Other Ambulatory Visit: Payer: Self-pay

## 2020-05-31 VITALS — BP 113/84 | HR 69 | Temp 97.9°F | Resp 17 | Ht 61.0 in | Wt 158.2 lb

## 2020-05-31 DIAGNOSIS — M79605 Pain in left leg: Secondary | ICD-10-CM | POA: Diagnosis not present

## 2020-05-31 DIAGNOSIS — B20 Human immunodeficiency virus [HIV] disease: Secondary | ICD-10-CM | POA: Diagnosis not present

## 2020-05-31 DIAGNOSIS — I10 Essential (primary) hypertension: Secondary | ICD-10-CM | POA: Diagnosis not present

## 2020-05-31 DIAGNOSIS — Z09 Encounter for follow-up examination after completed treatment for conditions other than malignant neoplasm: Secondary | ICD-10-CM | POA: Diagnosis not present

## 2020-05-31 DIAGNOSIS — G629 Polyneuropathy, unspecified: Secondary | ICD-10-CM | POA: Diagnosis not present

## 2020-05-31 DIAGNOSIS — G8929 Other chronic pain: Secondary | ICD-10-CM

## 2020-05-31 DIAGNOSIS — M79604 Pain in right leg: Secondary | ICD-10-CM | POA: Diagnosis not present

## 2020-05-31 NOTE — Progress Notes (Signed)
[facility]    Established Patient Office Visit  Subjective:  Patient ID: Cathy Smith, female    DOB: 04-Feb-1966  Age: 54 y.o. MRN: [id number]  CC:  Chief Complaint  Patient presents with  . Follow-up    Pt states she is here for her f/u today. Pt states she would to talk about her legs and how the medicine is working. Pt states her legs brotheres her when she wake up in the mornings. X2-3 months.    HPI Cathy Smith is a 54 year old female who presents for Follow Up today.    Patient Active Problem List   Diagnosis Date Noted  . Claudication in peripheral vascular disease (HCC) 03/01/2020  . Prediabetes 12/03/2019  . Chronic pain of lower extremity 12/03/2019  . Neuropathy 08/17/2019  . Suspected deep vein thrombosis (DVT) 04/09/2019  . AIN grade III 01/18/2019  . Left leg pain 12/14/2018  . 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 LGSIL Pap smear of vagina 04/28/2017  . 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 History of fusion of cervical spine 08/27/2015  . 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 Physical exam, annual 09/06/2014  . Alopecia of scalp 09/06/2014  . Hypertriglyceridemia without hypercholesterolemia 08/08/2014  . Essential hypertension 06/12/2009  . MONILIASIS, ORAL 09/24/2008  . HX, PERSONAL, PAST NONCOMPLIANCE 11/24/2006  . Human immunodeficiency virus (HIV) disease (HCC) 09/15/2000  . HYSTERECTOMY, HX OF 11/10/1983      Past Medical History:  Diagnosis Date  . Coronary artery disease   . History of condyloma acuminatum   . History of uterine fibroid   . History of vaginal dysplasia    VAIN III--  oncologist-  dr Andrey Farmer  . History of vulvar dysplasia    VIN III   gyn-oncologist-  dr Andrey Farmer  . HIV infection (HCC) [facility]-  dr hatcher  . Hypertension   . Hypertriglyceridemia without hypercholesterolemia   . PONV (postoperative nausea and vomiting)   . VIN III  (vulvar intraepithelial neoplasia III)   . Wears glasses    Current Status: Since her last office visit, overall she is doing well, but she continues to have increasing leg pain in the mornings. She continues to take Gabapentin as prescribed. She continues to follow up with Infection Disease as needed for diagnosis of HIV. She denies visual changes, chest pain, cough, shortness of breath, heart palpitations, and falls. She has occasional headaches and dizziness with position changes. Denies severe headaches, confusion, seizures, double vision, and blurred vision, nausea and vomiting. She denies fevers, chills, fatigue, recent infections, weight loss, and night sweats. Denies GI problems such as diarrhea, and constipation. She has no reports of blood in stools, dysuria and hematuria. No depression or anxiety reported today. She is taking all medications as prescribed.   Past Surgical History:  Procedure Laterality Date  . ABDOMINAL HYSTERECTOMY  1995 approx  . ANTERIOR CERVICAL DECOMP/DISCECTOMY FUSION  11-30-2009   C4 -- C6  . CO2 LASER APPLICATION Bilateral 07/23/2015   Procedure: BILATERAL CO2 LASER ABLATION OF THE VULVA;  Surgeon: Adolphus Birchwood, MD;  Location: [facility] [place];  Service: Gynecology;  Laterality: Bilateral;  . CO2 LASER APPLICATION N/A 02/25/2016   Procedure: CO2 LASER OF THE VULVAR;  Surgeon: Adolphus Birchwood, MD;  Location: [facility] [place];  Service: Gynecology;  Laterality: N/A;  . CO2 LASER APPLICATION N/A 05/04/2017   Procedure: CO2 LASER VAPORIZATION  OF THE VULVA;  Surgeon: Adolphus Birchwoodossi, Emma, MD;  [facility] [place];  Service: Gynecology;  Laterality: N/A;  . CO2 LASER APPLICATION N/A 12/27/2018   Procedure: CO2 LASER APPLICATION OF VULVA;  Surgeon: Adolphus Birchwoodossi, Emma, MD;  [facility] [place];  Service: Gynecology;  Laterality: N/A;  . COLONOSCOPY  01-28-2015  . CORONARY ARTERY BYPASS GRAFT N/A 11/25/2017   Procedure: CORONARY ARTERY BYPASS  GRAFTING (CABG), ON PUMP, TIMES THREE, USING LEFT INTERNAL MAMMARY ARTERY AND ENDOSCOPICALLY HARVESTED LEFT GREATER SAPHENOUS VEIN;  Surgeon: Purcell Nailswen, Clarence H, MD;  [facility];  Service: Open Heart Surgery;  Laterality: N/A;  . I & D LEFT BUTTOCK ABSCESS  04-28-2001  . LEFT HEART CATH AND CORONARY ANGIOGRAPHY N/A 11/22/2017   Procedure: LEFT HEART CATH AND CORONARY ANGIOGRAPHY;  Surgeon: Runell GessBerry, Jonathan J, MD;  Location: [facility];  Service: Cardiovascular;  Laterality: N/A;  . TEE WITHOUT CARDIOVERSION N/A 11/25/2017   Procedure: TRANSESOPHAGEAL ECHOCARDIOGRAM (TEE);  Surgeon: Purcell Nailswen, Clarence H, MD;  [facility];  Service: Open Heart Surgery;  Laterality: N/A;  . VULVECTOMY Right 05/06/2015   Procedure: WIDE LOCAL  EXCISION  OF RIGHT VULVA;  Surgeon: Adolphus BirchwoodEmma Rossi, MD;  [facility] [place];  Service: Gynecology;  Laterality: Right;  Marland Kitchen. VULVECTOMY N/A 05/04/2017   Procedure: WIDE EXCISION VULVECTOMY;  Surgeon: Adolphus Birchwoodossi, Emma, MD;  [facility] [place];  Service: Gynecology;  Laterality: N/A;  . VULVECTOMY N/A 12/27/2018   Procedure: WIDE EXCISION VULVECTOMY;  Surgeon: Adolphus Birchwoodossi, Emma, MD;  [facility] ;  Service: Gynecology;  Laterality: N/A;    Family History  Problem Relation Age of Onset  . Cataracts Mother   . Hypertension Father   . CAD Paternal Grandfather   . Colon cancer Neg Hx     Social History   Socioeconomic History  . Marital status: Married    Spouse name: Not on file  . Number of children: Not on file  . Years of education: Not on file  . Highest education level: Not on file  Occupational History  . Not on file  Tobacco Use  . Smoking status: Never Smoker  . Smokeless tobacco: Never Used  Vaping Use  . Vaping Use: Never used  Substance and Sexual Activity  . Alcohol use: No    Alcohol/week: 0.0 standard drinks  . Drug use: No  . Sexual activity: Yes    Partners: Male    Birth control/protection: Condom     Comment: pt. given condoms  Other Topics Concern  . Not on file  Social History Narrative  . Not on file   Social Determinants of Health   Financial Resource Strain:   . Difficulty of Paying Living Expenses:   Food Insecurity:   . Worried About Programme researcher, broadcasting/film/videounning Out of Food in the Last Year:   . Baristaan Out of Food in the Last Year:   Transportation Needs:   . Freight forwarderLack of Transportation (Medical):   Marland Kitchen. Lack of Transportation (Non-Medical):   Physical Activity:   . Days of Exercise per Week:   . Minutes of Exercise per Session:   Stress:   . Feeling of Stress :   Social Connections:   . Frequency of Communication with Friends and Family:   . Frequency of Social Gatherings with Friends and Family:   . Attends Religious Services:   . Active Member of Clubs or Organizations:   . Attends BankerClub or Organization Meetings:   Marland Kitchen. Marital Status:  Intimate Partner Violence:   . Fear of Current or Ex-Partner:   . Emotionally Abused:   Marland Kitchen Physically Abused:   . Sexually Abused:     Outpatient Medications Prior to Visit  Medication Sig Dispense Refill  . acetaminophen (TYLENOL) 325 MG tablet Take 650 mg by mouth every 6 (six) hours as needed.    Marland Kitchen aspirin EC 325 MG EC tablet Take 1 tablet (325 mg total) by mouth daily. 30 tablet 0  . atorvastatin (LIPITOR) 20 MG tablet TAKE 1 TABLET(20 MG) BY MOUTH DAILY AT 6 PM 90 tablet 2  . cyclobenzaprine (FLEXERIL) 10 MG tablet     . furosemide (LASIX) 40 MG tablet TAKE 1 TABLET(40 MG) BY MOUTH DAILY 90 tablet 3  . gabapentin (NEURONTIN) 300 MG capsule TAKE 1 CAPSULE(300 MG) BY MOUTH THREE TIMES DAILY 90 capsule 3  . GENVOYA 150-150-200-10 MG TABS tablet TAKE 1 TABLET BY MOUTH DAILY WITH BREAKFAST 30 tablet 5  . metoprolol tartrate (LOPRESSOR) 25 MG tablet Take 1.5 tablets (37.5 mg total) by mouth 2 (two) times daily. 270 tablet 2  . PREZISTA 800 MG tablet TAKE 1 TABLET(800 MG) BY MOUTH DAILY 30 tablet 5   No facility-administered medications prior to visit.     No Known Allergies  ROS Review of Systems  Constitutional: Negative.   Eyes: Negative.   Respiratory: Negative.   Cardiovascular: Negative.   Gastrointestinal: Negative.   Endocrine: Negative.   Genitourinary: Negative.   Musculoskeletal:       Bilateral chronic leg pain  Skin: Negative.   Allergic/Immunologic: Negative.   Neurological: Positive for dizziness (occasional ) and headaches (occasional ).  Hematological: Negative.   Psychiatric/Behavioral: Negative.       Objective:    Physical Exam Vitals and nursing note reviewed.  Constitutional:      Appearance: Normal appearance. She is obese.  HENT:     Head: Normocephalic and atraumatic.     Nose: Nose normal.     Mouth/Throat:     Mouth: Mucous membranes are moist.     Pharynx: Oropharynx is clear.  Cardiovascular:     Rate and Rhythm: Normal rate and regular rhythm.     Pulses: Normal pulses.     Heart sounds: Normal heart sounds.  Pulmonary:     Effort: Pulmonary effort is normal.     Breath sounds: Normal breath sounds.  Abdominal:     General: Bowel sounds are normal. There is distension.     Palpations: Abdomen is soft.  Musculoskeletal:        General: Normal range of motion.     Cervical back: Normal range of motion and neck supple.  Skin:    General: Skin is warm and dry.  Neurological:     Mental Status: She is alert.     Motor: Weakness (chronic bilateral leg pain) present.     Gait: Gait abnormal.  Psychiatric:        Mood and Affect: Mood normal.        Behavior: Behavior normal.        Thought Content: Thought content normal.        Judgment: Judgment normal.     BP 113/84 (BP Location: Left Arm, Patient Position: Sitting, Cuff Size: Normal)   Pulse 69   Temp 97.9 F (36.6 C)   Resp 17   Ht 5\' 1"  (1.549 m)   Wt 158 lb 3.2 oz (71.8 kg)   SpO2 100%   BMI 29.89 kg/m  Wt Readings from Last 3 Encounters:  05/31/20 158 lb 3.2 oz (71.8 kg)  04/03/20 160 lb 6.4 oz (72.8 kg)   03/01/20 163 lb (73.9 kg)     There are no preventive care reminders to display for this patient.  There are no preventive care reminders to display for this patient.  Lab Results  Component Value Date   TSH 1.050 08/16/2019   Lab Results  Component Value Date   WBC 3.4 (L) 08/01/2019   HGB 12.9 08/01/2019   HCT 37.6 08/01/2019   MCV 96.9 08/01/2019   PLT 172 08/01/2019   Lab Results  Component Value Date   NA 142 08/01/2019   K 4.0 08/01/2019   CO2 27 08/01/2019   GLUCOSE 126 (H) 08/01/2019   BUN 12 08/01/2019   CREATININE 0.94 08/01/2019   BILITOT 0.4 03/01/2020   ALKPHOS 98 03/01/2020   AST 52 (H) 03/01/2020   ALT 58 (H) 03/01/2020   PROT 8.1 03/01/2020   ALBUMIN 4.9 03/01/2020   CALCIUM 9.6 08/01/2019   ANIONGAP 8 12/27/2018   Lab Results  Component Value Date   CHOL 143 03/01/2020   Lab Results  Component Value Date   HDL 44 03/01/2020   Lab Results  Component Value Date   LDLCALC 69 03/01/2020   Lab Results  Component Value Date   TRIG 175 (H) 03/01/2020   Lab Results  Component Value Date   CHOLHDL 3.3 03/01/2020   Lab Results  Component Value Date   HGBA1C 6.3 (A) 12/01/2019      Assessment & Plan:   1. Chronic pain of lower extremity, bilateral She will continue pain medication as prescribed.   2. Neuropathy Stable. Not worsening. Continue Gabapentin as prescribed.   3. Essential hypertension The current medical regimen is effective; blood pressure is stable at 113/84 today; continue present plan and medications as prescribed. She will continue to take medications as prescribed, to decrease high sodium intake, excessive alcohol intake, increase potassium intake, smoking cessation, and increase physical activity of at least 30 minutes of cardio activity daily. She will continue to follow Heart Healthy or DASH diet. - POC Glucose (CBG) - POC HgB A1c - Urinalysis Dipstick  4. Human immunodeficiency virus (HIV) disease (HCC)  5.  Follow up She will follow up in 6 months.   No orders of the defined types were placed in this encounter.   Orders Placed This Encounter  Procedures  . POC Glucose (CBG)  . POC HgB A1c  . Urinalysis Dipstick    Referral Orders  No referral(s) requested today    Raliegh Ip,  MSN, FNP-BC [facility] [address] [place], [place] [address] [phone] fax  Problem List Items Addressed This Visit      Cardiovascular and Mediastinum   Essential hypertension   Relevant Orders   POC Glucose (CBG)   POC HgB A1c   Urinalysis Dipstick     Nervous and Auditory   Neuropathy     Other   Human immunodeficiency virus (HIV) disease (HCC) (Chronic)    Other Visit Diagnoses    Chronic pain of lower extremity, bilateral    -  Primary   Follow up          No orders of the defined types were placed in this encounter.   Follow-up: No follow-ups on file.    Kallie Locks, FNP

## 2020-06-13 ENCOUNTER — Other Ambulatory Visit: Payer: Self-pay | Admitting: Cardiovascular Disease

## 2020-06-20 ENCOUNTER — Telehealth: Payer: Self-pay | Admitting: Cardiovascular Disease

## 2020-06-20 NOTE — Telephone Encounter (Signed)
lmtcb

## 2020-06-20 NOTE — Telephone Encounter (Signed)
New message     Patient had bypass surgery recently, she has questions about can she use lotion and perfume on her skin.  Please call

## 2020-06-21 NOTE — Telephone Encounter (Signed)
Spoke with pt, her bypass surgery was in 2019. Okay given for patient to use lotion near the incision but not right on top. She reports the incision is completely healed and she will use lotion around the area due to itching.

## 2020-08-12 ENCOUNTER — Ambulatory Visit (INDEPENDENT_AMBULATORY_CARE_PROVIDER_SITE_OTHER): Payer: Medicare HMO

## 2020-08-12 ENCOUNTER — Other Ambulatory Visit: Payer: Medicare HMO

## 2020-08-12 ENCOUNTER — Other Ambulatory Visit: Payer: Self-pay

## 2020-08-12 DIAGNOSIS — B20 Human immunodeficiency virus [HIV] disease: Secondary | ICD-10-CM | POA: Diagnosis not present

## 2020-08-12 DIAGNOSIS — Z23 Encounter for immunization: Secondary | ICD-10-CM | POA: Diagnosis not present

## 2020-08-13 LAB — T-HELPER CELL (CD4) - (RCID CLINIC ONLY)
CD4 % Helper T Cell: 24 % — ABNORMAL LOW (ref 33–65)
CD4 T Cell Abs: 448 /uL (ref 400–1790)

## 2020-08-14 LAB — HIV-1 RNA QUANT-NO REFLEX-BLD
HIV 1 RNA Quant: <20 Copies/mL
HIV-1 RNA Quant, Log: <1.3 Log cps/mL

## 2020-08-23 ENCOUNTER — Encounter: Payer: Self-pay | Admitting: Infectious Diseases

## 2020-08-29 ENCOUNTER — Encounter: Payer: Self-pay | Admitting: Cardiology

## 2020-08-29 ENCOUNTER — Other Ambulatory Visit: Payer: Self-pay

## 2020-08-29 ENCOUNTER — Ambulatory Visit: Payer: Medicare HMO | Admitting: Cardiology

## 2020-08-29 VITALS — BP 118/86 | HR 70 | Ht 61.0 in | Wt 159.4 lb

## 2020-08-29 DIAGNOSIS — Z951 Presence of aortocoronary bypass graft: Secondary | ICD-10-CM

## 2020-08-29 DIAGNOSIS — I10 Essential (primary) hypertension: Secondary | ICD-10-CM

## 2020-08-29 DIAGNOSIS — B20 Human immunodeficiency virus [HIV] disease: Secondary | ICD-10-CM

## 2020-08-29 DIAGNOSIS — E781 Pure hyperglyceridemia: Secondary | ICD-10-CM

## 2020-08-29 NOTE — Patient Instructions (Signed)
Medication Instructions:  DECREASE- Aspirin 81 mg by mouth daily  *If you need a refill on your cardiac medications before your next appointment, please call your pharmacy*   Lab Work: None Ordered  Testing/Procedures: None Ordered  Follow-Up: At [facility], you and your health needs are our priority.  As part of our continuing mission to provide you with exceptional heart care, we have created designated Provider Care Teams.  These Care Teams include your primary Cardiologist (physician) and Advanced Practice Providers (APPs -  Physician Assistants and Nurse Practitioners) who all work together to provide you with the care you need, when you need it.  We recommend signing up for the patient portal called "MyChart".  Sign up information is provided on this After Visit Summary.  MyChart is used to connect with patients for Virtual Visits (Telemedicine).  Patients are able to view lab/test results, encounter notes, upcoming appointments, etc.  Non-urgent messages can be sent to your provider as well.   To learn more about what you can do with MyChart, go to [email or web address].    Your next appointment:   6 month(s)   The format for your next appointment:   In Person  Provider:   You may see Jonathan Berry, MD or one of the following Advanced Practice Providers on your designated Care Team:    Luke Kilroy, PA-C  Callie Goodrich, PA-C  Jesse Cleaver, FNP     

## 2020-08-29 NOTE — Assessment & Plan Note (Signed)
July 2019 after abnormal Myoview-LIMA to LAD, SVG to OM, SVG to RPL, EVH via left thigh and leg

## 2020-08-29 NOTE — Progress Notes (Signed)
Cardiology Office Note:    Date:  08/29/2020   ID:  Cathy Smith, DOB Jun 26, 1966, MRN [id number]  PCP:  Kallie Locks, FNP  Cardiologist:  Nanetta Batty, MD  Electrophysiologist:  None   Referring MD: Kallie Locks, FNP   No chief complaint on file.   History of Present Illness:    Cathy Smith is a pleasant 54 y.o. female with a hx of coronary disease.  She had CABG x3 in January 2019.  LV function was normal.  Other medical problems include HIV on treatment, essential hypertension, and treated dyslipidemia.  She saw Dr. Allyson Sabal in April 2021 was doing well.  She denies any chest pain or unusual shortness of breath.  She has had some pain in her legs.  On exam she has intact dorsalis pedis pulses bilaterally.  Her symptoms are more consistent with neuropathy.  She tells me that the Neurontin she was placed on does seem to help.  Past Medical History:  Diagnosis Date  . Coronary artery disease   . History of condyloma acuminatum   . History of uterine fibroid   . History of vaginal dysplasia    VAIN III--  oncologist-  dr Andrey Farmer  . History of vulvar dysplasia    VIN III   gyn-oncologist-  dr Andrey Farmer  . HIV infection ([facility]) [facility]-  dr hatcher  . Hypertension   . Hypertriglyceridemia without hypercholesterolemia   . PONV (postoperative nausea and vomiting)   . VIN III (vulvar intraepithelial neoplasia III)   . Wears glasses     Past Surgical History:  Procedure Laterality Date  . ABDOMINAL HYSTERECTOMY  1995 approx  . ANTERIOR CERVICAL DECOMP/DISCECTOMY FUSION  11-30-2009   C4 -- C6  . CO2 LASER APPLICATION Bilateral 07/23/2015   Procedure: BILATERAL CO2 LASER ABLATION OF THE VULVA;  Surgeon: Adolphus Birchwood, MD;  Location: [facility];  Service: Gynecology;  Laterality: Bilateral;  . CO2 LASER APPLICATION N/A 02/25/2016   Procedure: CO2 LASER OF THE VULVAR;  Surgeon: Adolphus Birchwood, MD;  Location: [facility];   Service: Gynecology;  Laterality: N/A;  . CO2 LASER APPLICATION N/A 05/04/2017   Procedure: CO2 LASER VAPORIZATION OF THE VULVA;  Surgeon: Adolphus Birchwood, MD;  Location: [facility];  Service: Gynecology;  Laterality: N/A;  . CO2 LASER APPLICATION N/A 12/27/2018   Procedure: CO2 LASER APPLICATION OF VULVA;  Surgeon: Adolphus Birchwood, MD;  Location: [facility] [place];  Service: Gynecology;  Laterality: N/A;  . COLONOSCOPY  01-28-2015  . CORONARY ARTERY BYPASS GRAFT N/A 11/25/2017   Procedure: CORONARY ARTERY BYPASS GRAFTING (CABG), ON PUMP, TIMES THREE, USING LEFT INTERNAL MAMMARY ARTERY AND ENDOSCOPICALLY HARVESTED LEFT GREATER SAPHENOUS VEIN;  Surgeon: Purcell Nails, MD;  Location: [facility] OR;  Service: Open Heart Surgery;  Laterality: N/A;  . I & D LEFT BUTTOCK ABSCESS  04-28-2001  . LEFT HEART CATH AND CORONARY ANGIOGRAPHY N/A 11/22/2017   Procedure: LEFT HEART CATH AND CORONARY ANGIOGRAPHY;  Surgeon: Runell Gess, MD;  Location: [facility];  Service: Cardiovascular;  Laterality: N/A;  . TEE WITHOUT CARDIOVERSION N/A 11/25/2017   Procedure: TRANSESOPHAGEAL ECHOCARDIOGRAM (TEE);  Surgeon: Purcell Nails, MD;  Location: [facility] OR;  Service: Open Heart Surgery;  Laterality: N/A;  . VULVECTOMY Right 05/06/2015   Procedure: WIDE LOCAL  EXCISION  OF RIGHT VULVA;  Surgeon: Adolphus Birchwood, MD;  Location: [facility];  Service: Gynecology;  Laterality: Right;  .  VULVECTOMY N/A 05/04/2017   Procedure: WIDE EXCISION VULVECTOMY;  Surgeon: Adolphus Birchwood, MD;  Location: [facility];  Service: Gynecology;  Laterality: N/A;  . VULVECTOMY N/A 12/27/2018   Procedure: WIDE EXCISION VULVECTOMY;  Surgeon: Adolphus Birchwood, MD;  Location: [facility];  Service: Gynecology;  Laterality: N/A;    Current Medications: Current Meds  Medication Sig  . acetaminophen (TYLENOL) 325 MG tablet Take 650 mg by mouth every 6 (six) hours as needed.  Marland Kitchen aspirin EC 325 MG EC  tablet Take 1 tablet (325 mg total) by mouth daily.  Marland Kitchen atorvastatin (LIPITOR) 20 MG tablet TAKE 1 TABLET(20 MG) BY MOUTH DAILY AT 6 PM  . cyclobenzaprine (FLEXERIL) 10 MG tablet   . furosemide (LASIX) 40 MG tablet TAKE 1 TABLET(40 MG) BY MOUTH DAILY  . gabapentin (NEURONTIN) 300 MG capsule TAKE 1 CAPSULE(300 MG) BY MOUTH THREE TIMES DAILY  . GENVOYA 150-150-200-10 MG TABS tablet TAKE 1 TABLET BY MOUTH DAILY WITH BREAKFAST  . metoprolol tartrate (LOPRESSOR) 25 MG tablet TAKE 1 AND 1/2 TABLETS(37.5 MG) BY MOUTH TWICE DAILY  . PREZISTA 800 MG tablet TAKE 1 TABLET(800 MG) BY MOUTH DAILY     Allergies:   Patient has no known allergies.   Social History   Socioeconomic History  . Marital status: Married    Spouse name: Not on file  . Number of children: Not on file  . Years of education: Not on file  . Highest education level: Not on file  Occupational History  . Not on file  Tobacco Use  . Smoking status: Never Smoker  . Smokeless tobacco: Never Used  Vaping Use  . Vaping Use: Never used  Substance and Sexual Activity  . Alcohol use: No    Alcohol/week: 0.0 standard drinks  . Drug use: No  . Sexual activity: Yes    Partners: Male    Birth control/protection: Condom    Comment: pt. given condoms  Other Topics Concern  . Not on file  Social History Narrative  . Not on file   Social Determinants of Health   Financial Resource Strain:   . Difficulty of Paying Living Expenses: Not on file  Food Insecurity:   . Worried About Programme researcher, broadcasting/film/video in the Last Year: Not on file  . Ran Out of Food in the Last Year: Not on file  Transportation Needs:   . Lack of Transportation (Medical): Not on file  . Lack of Transportation (Non-Medical): Not on file  Physical Activity:   . Days of Exercise per Week: Not on file  . Minutes of Exercise per Session: Not on file  Stress:   . Feeling of Stress : Not on file  Social Connections:   . Frequency of Communication with Friends and  Family: Not on file  . Frequency of Social Gatherings with Friends and Family: Not on file  . Attends Religious Services: Not on file  . Active Member of Clubs or Organizations: Not on file  . Attends Banker Meetings: Not on file  . Marital Status: Not on file     Family History: The patient's family history includes CAD in her paternal grandfather; Cataracts in her mother; Hypertension in her father. There is no history of Colon cancer.  ROS:   Please see the history of present illness.     All other systems reviewed and are negative.  EKGs/Labs/Other Studies Reviewed:    The following studies were reviewed today: Pre CABG echo  11/25/2017- Study Conclusions   - Left ventricle: The cavity size was normal. Wall thickness was  normal. Systolic function was vigorous. The estimated ejection  fraction was in the range of 65% to 70%. Wall motion was normal;  there were no regional wall motion abnormalities.  - Staged echo: Limited Post-CPB exam: Good, vigorous, LV and RV  function. Overall EF remains 55-65%. There are no regional wall  motion abnormalities   EKG:  EKG is ordered today.  The ekg ordered today demonstrates NSR, HR 70, TWI 1,L, V2, LAD, no new changes  Recent Labs: 12/12/2019: Magnesium 1.7 03/01/2020: ALT 58  Recent Lipid Panel    Component Value Date/Time   CHOL 143 03/01/2020 1125   TRIG 175 (H) 03/01/2020 1125   HDL 44 03/01/2020 1125   CHOLHDL 3.3 03/01/2020 1125   CHOLHDL 4.9 01/27/2017 1412   VLDL 53 (H) 01/27/2017 1412   LDLCALC 69 03/01/2020 1125    Physical Exam:    VS:  BP 118/86   Pulse 70   Ht 5\' 1"  (1.549 m)   Wt 159 lb 6.4 oz (72.3 kg)   SpO2 92%   BMI 30.12 kg/m     Wt Readings from Last 3 Encounters:  08/29/20 159 lb 6.4 oz (72.3 kg)  05/31/20 158 lb 3.2 oz (71.8 kg)  04/03/20 160 lb 6.4 oz (72.8 kg)     GEN: Well nourished, well developed AA female in no acute distress HEENT: Normal NECK: No JVD; No carotid  bruits CARDIAC: RRR, no murmurs, rubs, gallops RESPIRATORY:  Clear to auscultation without rales, wheezing or rhonchi  ABDOMEN: Soft, non-tender, non-distended MUSCULOSKELETAL:  No edema; No deformity- 2+ bilateral DP pulses  SKIN: Warm and dry NEUROLOGIC:  Alert and oriented x 3 PSYCHIATRIC:  Normal affect   ASSESSMENT:    S/P CABG x 11 May 2018 after abnormal Myoview-LIMA to LAD, SVG to OM, SVG to RPL, EVH via left thigh and leg  Essential hypertension Controlled  Hypertriglyceridemia without hypercholesterolemia Controlled  Human immunodeficiency virus (HIV) disease (HCC) On treatment  PLAN:    I suggested she decrease her aspirin to 81 mg daily.  F/U Dr 13 May 2018 in 6 months.  I encouraged her to resume her walking program.    Medication Adjustments/Labs and Tests Ordered: Current medicines are reviewed at length with the patient today.  Concerns regarding medicines are outlined above.  No orders of the defined types were placed in this encounter.  No orders of the defined types were placed in this encounter.   There are no Patient Instructions on file for this visit.   Signed, Allyson Sabal, PA-C  08/29/2020 4:18 PM    [facility]

## 2020-08-29 NOTE — Progress Notes (Signed)
error 

## 2020-08-29 NOTE — Assessment & Plan Note (Signed)
On treatment.  

## 2020-08-29 NOTE — Assessment & Plan Note (Signed)
Controlled.  

## 2020-08-30 ENCOUNTER — Ambulatory Visit (INDEPENDENT_AMBULATORY_CARE_PROVIDER_SITE_OTHER): Payer: Medicare Other

## 2020-08-30 DIAGNOSIS — Z23 Encounter for immunization: Secondary | ICD-10-CM

## 2020-08-30 NOTE — Progress Notes (Signed)
   Covid-19 Vaccination Clinic  Name:  Cathy Smith    MRN: [id number] DOB: 1965-11-29  08/30/2020  Cathy Smith was observed post Covid-19 immunization for 15 minutes without incident. She was provided with Vaccine Information Sheet and instruction to access the V-Safe system.   Cathy Smith was instructed to call 911 with any severe reactions post vaccine: Marland Kitchen Difficulty breathing  . Swelling of face and throat  . A fast heartbeat  . A bad rash all over body  . Dizziness and weakness     Rino Hosea T Pricilla Loveless

## 2020-09-03 ENCOUNTER — Telehealth (INDEPENDENT_AMBULATORY_CARE_PROVIDER_SITE_OTHER): Payer: Medicare HMO | Admitting: Infectious Diseases

## 2020-09-03 ENCOUNTER — Telehealth: Payer: Self-pay | Admitting: *Deleted

## 2020-09-03 ENCOUNTER — Encounter: Payer: Self-pay | Admitting: Infectious Diseases

## 2020-09-03 ENCOUNTER — Telehealth: Payer: Self-pay | Admitting: Cardiovascular Disease

## 2020-09-03 ENCOUNTER — Other Ambulatory Visit: Payer: Self-pay

## 2020-09-03 DIAGNOSIS — B20 Human immunodeficiency virus [HIV] disease: Secondary | ICD-10-CM

## 2020-09-03 DIAGNOSIS — M79606 Pain in leg, unspecified: Secondary | ICD-10-CM

## 2020-09-03 DIAGNOSIS — D071 Carcinoma in situ of vulva: Secondary | ICD-10-CM | POA: Diagnosis not present

## 2020-09-03 DIAGNOSIS — G8929 Other chronic pain: Secondary | ICD-10-CM

## 2020-09-03 DIAGNOSIS — G629 Polyneuropathy, unspecified: Secondary | ICD-10-CM | POA: Diagnosis not present

## 2020-09-03 NOTE — Assessment & Plan Note (Signed)
Based on notes I told her to call the office January 2022 as they want to see her back in May 2022.

## 2020-09-03 NOTE — Telephone Encounter (Signed)
Returned patient's call and explained to call in January for an appt in May

## 2020-09-03 NOTE — Patient Instructions (Signed)
Please schedule an appointment with Cathy Smith to talk about the leg pain and if there is another option that won't make you as sleepy during the day.   Continue the Prezista and Genvoya once a day like you have been. Your viral load is undetectable and immune system (CD4 count) looks very good!  Thank you for getting the flu shot and COVID booster.    Please call to schedule an appointment in 6 months to see [place] again with labs - I would like to check your liver function tests again this visit as well.

## 2020-09-03 NOTE — Progress Notes (Signed)
Name: Cathy Smith  DOB: 07/17/1966 MRN: [id number] PCP: Azzie Glatter, FNP   Virtual Visit via MyChart Portal:  I connected with Cathy Smith on 09/03/20 at 10:00 AM EDT by VIDEO and verified that I am speaking with the correct person using two identifiers.   I discussed the limitations, risks, security and privacy concerns of performing an evaluation and management service by virtual services and the availability of in person appointments. I also discussed with the patient that there may be a patient responsible charge related to this service. The patient expressed understanding and agreed to proceed.  Patient Location - residence in [facility] Provider Location - [facility]    Patient Active Problem List   Diagnosis Date Noted  . Claudication in peripheral vascular disease ([place]) 03/01/2020  . Prediabetes 12/03/2019  . Chronic pain of lower extremity 12/03/2019  . Neuropathy 08/17/2019  . Suspected deep vein thrombosis (DVT) 04/09/2019  . AIN grade III 01/18/2019  . Left leg pain 12/14/2018  . 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 LGSIL Pap smear of vagina 04/28/2017  . 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 History of fusion of cervical spine 08/27/2015  . 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 Physical exam, annual 09/06/2014  . Alopecia of scalp 09/06/2014  . Hypertriglyceridemia without hypercholesterolemia 08/08/2014  . Essential hypertension 06/12/2009  . MONILIASIS, ORAL 09/24/2008  . HX, PERSONAL, PAST NONCOMPLIANCE 11/24/2006  . Human immunodeficiency virus (HIV) disease ([place]) 09/15/2000  . HYSTERECTOMY, HX OF 11/10/1983     Brief Narrative:  Cathy Smith  is a 54 y.o. female with well controlled HIV disease, Dx 2005 per chart review. CD4 nadir 60 VL 12,300  HIV Risk: HIV+ husband, heterosexual History of OIs: vulvar cancer  Intake Labs 2014: Hep B sAg (not done), sAb (-), cAb (not done); Hep A (not done), Hep C (not done) Quantiferon () HLA B*5701  (-) G6PD: ()   Previous Regimens: Cathy Smith + Cathy Smith   Genotypes: . 2008 - P14S, V35I, S68G, R83K, V106I,  [id number], [id number], [id number], T200I, L214F, E297K, D324E;  V3I, T4S, S37N, L63P  . 2009 - M184V  . 2010 - V106I  Cumulative Genotype:  RTI Resistance Mutations: M184V NNRTI Resistance Mutations: V106I  Nucleoside Reverse Transcriptase Inhibitors abacavir (ABC) Low-Level Resistance zidovudine (AZT) Susceptible emtricitabine (FTC) High-Level Resistance lamivudine (3TC) High-Level Resistance tenofovir (TDF) Susceptible  Non-nucleoside Reverse Transcriptase Inhibitors doravirine (DOR) Potential Low-Level Resistance efavirenz (EFV) Susceptible etravirine (ETR) Potential Low-Level Resistance nevirapine (NVP) Potential Low-Level Resistance rilpivirine (RPV) Potential Low-Level Resistance  PI Major Resistance Mutations: None PI Accessory Resistance Mutations: None  Protease Inhibitors atazanavir/r (ATV/r) Susceptible darunavir/r (DRV/r) Susceptible lopinavir/r (LPV/r) Susceptible  Subjective:  CC: HIV follow up care.  Aching joints - working with PT.    HPI: [name] is here for routine follow up care for HIV. She has had long-standing perfectly controlled HIV on Genvoya + Cathy Smith with last VL < 20 and CD4 411 April 2021. She has no trouble with her medications and takes them every day with food as directed. She is married; her husband is HIV+ and in care with Cathy Smith on ARVs.    The gabapentin does not help much with the leg pain. She is working with her PCP. The pain is located behind the calf down to the heel. Hurts most in the morning. It does hurt when she walks. Gradually gets better during the day. Does not have any swelling. Moving around does eventually help  it feel better. Feels like numb/cold pain. It makes her "waddle and hunch over" first thing in the morning. Cannot sit down for a while d/t the pain with leg straight due to the pain. Never throbbing pain at  night.   She has a history of vulvovaginal dysplasia (VIN III). She still follows with her gynocology team. Missed a follow up apt this year but cannot recall when.    Review of Systems  Constitutional: Negative for chills, diaphoresis, fever, malaise/fatigue and weight loss.  HENT: Negative for sore throat.   Cardiovascular: Negative for chest pain and leg swelling.  Gastrointestinal: Negative for abdominal pain, diarrhea, nausea and vomiting.  Genitourinary: Negative for dysuria.  Musculoskeletal: Negative for falls.       Leg pain as described above  Neurological: Positive for tingling (as described above). Negative for weakness.  Psychiatric/Behavioral: Negative for depression and substance abuse. The patient is not nervous/anxious and does not have insomnia.     Past Medical History:  Diagnosis Date  . Coronary artery disease   . History of condyloma acuminatum   . History of uterine fibroid   . History of vaginal dysplasia    VAIN III--  oncologist-  Cathy Smith  . History of vulvar dysplasia    VIN III   gyn-oncologist-  Cathy Smith  . HIV infection ([place]) [facility]-  Cathy Smith  . Hypertension   . Hypertriglyceridemia without hypercholesterolemia   . PONV (postoperative nausea and vomiting)   . VIN III (vulvar intraepithelial neoplasia III)   . Wears glasses     Outpatient Medications Prior to Visit  Medication Sig Dispense Refill  . acetaminophen (TYLENOL) 325 MG tablet Take 650 mg by mouth every 6 (six) hours as needed.    Marland Kitchen aspirin EC 81 MG tablet Take 81 mg by mouth daily. Swallow whole.    Marland Kitchen atorvastatin (LIPITOR) 20 MG tablet TAKE 1 TABLET(20 MG) BY MOUTH DAILY AT 6 PM 90 tablet 2  . furosemide (LASIX) 40 MG tablet TAKE 1 TABLET(40 MG) BY MOUTH DAILY 90 tablet 3  . gabapentin (NEURONTIN) 300 MG capsule TAKE 1 CAPSULE(300 MG) BY MOUTH THREE TIMES DAILY 90 capsule 3  . GENVOYA 150-150-200-10 MG TABS tablet TAKE 1 TABLET BY MOUTH DAILY WITH  BREAKFAST 30 tablet 5  . metoprolol tartrate (LOPRESSOR) 25 MG tablet TAKE 1 AND 1/2 TABLETS(37.5 MG) BY MOUTH TWICE DAILY 270 tablet 2  . Cathy Smith 800 MG tablet TAKE 1 TABLET(800 MG) BY MOUTH DAILY 30 tablet 5  . cyclobenzaprine (FLEXERIL) 10 MG tablet  (Patient not taking: Reported on 09/03/2020)     No facility-administered medications prior to visit.     No Known Allergies  Social History   Tobacco Use  . Smoking status: Never Smoker  . Smokeless tobacco: Never Used  Vaping Use  . Vaping Use: Never used  Substance Use Topics  . Alcohol use: No    Alcohol/week: 0.0 standard drinks  . Drug use: No    Family History  Problem Relation Age of Onset  . Cataracts Mother   . Hypertension Father   . CAD Paternal Grandfather   . Colon cancer Neg Hx     Social History   Substance and Sexual Activity  Sexual Activity Yes  . Partners: Male  . Birth control/protection: Condom   Comment: pt. given condoms     Objective:   There were no vitals filed for this visit. There is no height or weight on file  to calculate BMI.   Physical Exam Constitutional:      Appearance: Normal appearance. She is not ill-appearing.  HENT:     Mouth/Throat:     Mouth: Mucous membranes are moist.     Pharynx: Oropharynx is clear.  Eyes:     General: No scleral icterus. Pulmonary:     Effort: Pulmonary effort is normal.  Neurological:     Mental Status: She is oriented to person, place, and time.  Psychiatric:        Mood and Affect: Mood normal.        Thought Content: Thought content normal.     Lab Results Lab Results  Component Value Date   WBC 3.4 (L) 08/01/2019   HGB 12.9 08/01/2019   HCT 37.6 08/01/2019   MCV 96.9 08/01/2019   PLT 172 08/01/2019    Lab Results  Component Value Date   CREATININE 0.94 08/01/2019   BUN 12 08/01/2019   NA 142 08/01/2019   K 4.0 08/01/2019   CL 104 08/01/2019   CO2 27 08/01/2019    Lab Results  Component Value Date   ALT 58 (H)  03/01/2020   AST 52 (H) 03/01/2020   ALKPHOS 98 03/01/2020   BILITOT 0.4 03/01/2020    Lab Results  Component Value Date   CHOL 143 03/01/2020   HDL 44 03/01/2020   LDLCALC 69 03/01/2020   TRIG 175 (H) 03/01/2020   CHOLHDL 3.3 03/01/2020   HIV 1 RNA Quant  Date Value  08/12/2020 <20 Copies/mL  02/19/2020 <20 NOT DETECTED copies/mL  08/01/2019 <20 NOT DETECTED copies/mL   CD4 T Cell Abs (/uL)  Date Value  08/12/2020 448  02/19/2020 411  05/09/2018 300 (L)     Assessment & Plan:   Problem List Items Addressed This Visit      Unprioritized   VIN III (vulvar intraepithelial neoplasia III)    Based on notes I told her to call the office January 2022 as they want to see her back in May 2022.       Neuropathy    Gabapentin is making her drowsy during the day so probably will limit any dose escalation.  I asked her to make an appointment with her PCP to follow up given she feels this is worsening.  No falls recently.       Human immunodeficiency virus (HIV) disease ([place]) - Primary (Chronic)    Doing well on Cathy Smith + Genvoya. Will continue these for her.  Follow up labs in 48mwith repeat LFTs.  No significant drug interactions noted - atorvastatin is at max dose with co-administration with Genvoya. LDL in target range recently for secondary prevention of diabetic patient.   COVID booster received. Flu vaccine received.       Relevant Orders   HIV-1 RNA quant-no reflex-bld   T-helper cell (CD4)- ([facility] clinic only)   COMPLETE METABOLIC PANEL WITH GFR   Chronic pain of lower extremity    Suspect neuropathy contributing describes it in lower foot up to calf posteriorly. No swelling or warmth.  Resting ABIs in May 2021 normal ranges. ?if exercise study may be more helpful. May be more than one cause. No benefit from physical therapy.          SJanene Madeira MSN, NP-C RSurgcenter Camelbackfor Infectious DClayhatcheePager: [phone]:  [phone] 09/03/20  10:46 AM

## 2020-09-03 NOTE — Assessment & Plan Note (Signed)
Suspect neuropathy contributing describes it in lower foot up to calf posteriorly. No swelling or warmth.  Resting ABIs in May 2021 normal ranges. ?if exercise study may be more helpful. May be more than one cause. No benefit from physical therapy.

## 2020-09-03 NOTE — Assessment & Plan Note (Signed)
Doing well on Prezista + Genvoya. Will continue these for her.  Follow up labs in 51m with repeat LFTs.  No significant drug interactions noted - atorvastatin is at max dose with co-administration with Genvoya. LDL in target range recently for secondary prevention of diabetic patient.   COVID booster received. Flu vaccine received.

## 2020-09-03 NOTE — Assessment & Plan Note (Addendum)
Gabapentin is making her drowsy during the day so probably will limit any dose escalation.  I asked her to make an appointment with her PCP to follow up given she feels this is worsening.  No falls recently.

## 2020-09-03 NOTE — Telephone Encounter (Signed)
Pt c/o medication issue:  1. Name of Medication:   aspirin EC 81 MG tablet  2. How are you currently taking this medication (dosage and times per day)?  Takes 4 every 4 hours   3. Are you having a reaction (difficulty breathing--STAT)?   No   4. What is your medication issue?   Would like to know if she should be continue taking this aspirin or if regular aspirin is fine  Please call/advise.   Thank you

## 2020-09-04 NOTE — Telephone Encounter (Signed)
Jaynia returning [employer] phone call, connected call to [place].

## 2020-09-04 NOTE — Telephone Encounter (Signed)
Patient called back. Informed patient that she was to only take Aspirin 81 mg by mouth daily, not 325 mg. Patient was still unsure, repeated instructions several times. Patient questioned the instruction on the over the counter bottle about taking more than 81 mg every 4 hours. Informed patient those instructions are for patient who are taking aspirin for pain. Informed patient that she is taking Aspirin for heart disease. Patient verbalized understanding and will start taking Aspirin 81 mg by mouth daily.

## 2020-09-04 NOTE — Telephone Encounter (Signed)
Pt called and stated she was last seen by luke, she was given some byer Asprin during the visit.  She is confused as to what mg she needs to take daily.   She would like to see if she could speak to [facility] Nurse since Dr Allyson Sabal is not in the office   [employer] number [phone]

## 2020-09-04 NOTE — Telephone Encounter (Signed)
Left message for patient to call back. Patient should be on ASA 81 mg per office visit with Corine Shelter PA.

## 2020-10-07 ENCOUNTER — Other Ambulatory Visit: Payer: Self-pay

## 2020-10-07 ENCOUNTER — Ambulatory Visit
Admission: RE | Admit: 2020-10-07 | Discharge: 2020-10-07 | Disposition: A | Discharge status: Home / Self Care | Payer: Medicare HMO | Source: Ambulatory Visit | Attending: Family Medicine | Admitting: Family Medicine

## 2020-10-07 DIAGNOSIS — Z1231 Encounter for screening mammogram for malignant neoplasm of breast: Secondary | ICD-10-CM

## 2020-10-11 ENCOUNTER — Other Ambulatory Visit: Payer: Self-pay | Admitting: Family Medicine

## 2020-10-11 DIAGNOSIS — M79604 Pain in right leg: Secondary | ICD-10-CM

## 2020-10-11 DIAGNOSIS — G629 Polyneuropathy, unspecified: Secondary | ICD-10-CM

## 2020-10-15 ENCOUNTER — Telehealth: Payer: Self-pay | Admitting: Family Medicine

## 2020-10-15 ENCOUNTER — Other Ambulatory Visit: Payer: Self-pay | Admitting: Family Medicine

## 2020-10-15 DIAGNOSIS — G629 Polyneuropathy, unspecified: Secondary | ICD-10-CM

## 2020-10-15 DIAGNOSIS — G8929 Other chronic pain: Secondary | ICD-10-CM

## 2020-10-15 MED ORDER — GABAPENTIN 300 MG PO CAPS: 300.0000 mg | ORAL_CAPSULE | Freq: Three times a day (TID) | ORAL | 3 refills | Status: DC

## 2020-10-15 NOTE — Telephone Encounter (Signed)
Done

## 2020-10-15 NOTE — Telephone Encounter (Signed)
This one is a duplicate and was filled today. It can be refused/closed.

## 2020-10-16 NOTE — Progress Notes (Signed)
Normal result letter generated and sent via MyChart. 

## 2020-10-22 ENCOUNTER — Other Ambulatory Visit: Payer: Self-pay | Admitting: Infectious Diseases

## 2020-10-22 DIAGNOSIS — B20 Human immunodeficiency virus [HIV] disease: Secondary | ICD-10-CM

## 2020-10-24 ENCOUNTER — Ambulatory Visit (INDEPENDENT_AMBULATORY_CARE_PROVIDER_SITE_OTHER): Payer: Medicare HMO | Admitting: Infectious Diseases

## 2020-10-24 ENCOUNTER — Encounter: Payer: Self-pay | Admitting: Infectious Diseases

## 2020-10-24 ENCOUNTER — Other Ambulatory Visit: Payer: Self-pay

## 2020-10-24 VITALS — BP 129/79 | HR 73 | Temp 98.2°F | Ht 62.0 in | Wt 157.0 lb

## 2020-10-24 DIAGNOSIS — B20 Human immunodeficiency virus [HIV] disease: Secondary | ICD-10-CM

## 2020-10-24 DIAGNOSIS — D071 Carcinoma in situ of vulva: Secondary | ICD-10-CM | POA: Diagnosis not present

## 2020-10-24 MED ORDER — DARUNAVIR ETHANOLATE 800 MG PO TABS: 800.0000 mg | ORAL_TABLET | Freq: Every day | ORAL | 11 refills | Status: DC

## 2020-10-24 MED ORDER — GENVOYA 150-150-200-10 MG PO TABS: 1.0000 | ORAL_TABLET | Freq: Every day | ORAL | 11 refills | Status: DC

## 2020-10-24 NOTE — Assessment & Plan Note (Signed)
HPV noted on vaginal pap May 2021. Follows with Dr. Andrey Farmer and planned next exam in May 2022. We discussed this plan today.

## 2020-10-24 NOTE — Patient Instructions (Addendum)
Nice to see you today - please plan to follow up in 6 months. Schedule your lab visit prior to your next appointment please.   Have a happy holidays!   You have plenty of refills for your medication.

## 2020-10-24 NOTE — Assessment & Plan Note (Signed)
Doing well on Genvoya and Prezista. No labs needed today as she had them in October. She can plan to return in 6 months.   Vaccination counseling discussed at today's visit. Updated as outlined per recommendations for [facility] at today's visit. All are updated including Pfizer booster   Last cervical cancer screening:  [facility] PAP         PAP SMEAR-Modifier (Yearly) Next due on 04/03/2021   04/03/2020  Cytology - PAP   Only the first 1 history entries have been loaded, but more history exists.         Results were: abnormal with + HPV   Contraception: status post hysterectomy. No family planning or pre-conception counseling needed at this time.  Last Mammogram: [facility] Mammogram         MAMMOGRAM (Every 2 Years) Next due on 10/07/2022   10/07/2020  [facility] 3D SCREEN BREAST BILATERAL   Only the first 1 history entries have been loaded, but more history exists.         Results were: normal Last colon cancer screening: [facility] Colonoscopy         COLONOSCOPY (Every 10 Years) Next due on 01/27/2025   01/28/2015  COLONOSCOPY         Depression screening discussed today - no needs at this time.  No dental needs identified today.

## 2020-10-24 NOTE — Progress Notes (Signed)
Name: Cathy Smith  DOB: 1966-07-04 MRN: [id number] PCP: Azzie Glatter, FNP     Patient Active Problem List   Diagnosis Date Noted  . Claudication in peripheral vascular disease ([place]) 03/01/2020  . Prediabetes 12/03/2019  . Chronic pain of lower extremity 12/03/2019  . Neuropathy 08/17/2019  . Suspected deep vein thrombosis (DVT) 04/09/2019  . AIN grade III 01/18/2019  . Left leg pain 12/14/2018  . 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 LGSIL Pap smear of vagina 04/28/2017  . 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 History of fusion of cervical spine 08/27/2015  . 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 Physical exam, annual 09/06/2014  . Alopecia of scalp 09/06/2014  . Hypertriglyceridemia without hypercholesterolemia 08/08/2014  . Essential hypertension 06/12/2009  . MONILIASIS, ORAL 09/24/2008  . HX, PERSONAL, PAST NONCOMPLIANCE 11/24/2006  . Human immunodeficiency virus (HIV) disease ([place]) 09/15/2000  . HYSTERECTOMY, HX OF 11/10/1983     Brief Narrative:  Cathy Smith  is a 55 y.o. female with well controlled HIV disease, Dx 2005 per chart review. CD4 nadir 60 VL 12,300  HIV Risk: HIV+ husband, heterosexual History of OIs: vulvar cancer  Intake Labs 2014: Hep B sAg (not done), sAb (-), cAb (not done); Hep A (not done), Hep C (not done) Quantiferon () HLA B*5701 (-) G6PD: ()   Previous Regimens: Jorje Guild + Prezista   Genotypes: . 2008 - P14S, V35I, S68G, R83K, V106I,  [id number], [id number], [id number], T200I, L214F, E297K, D324E;  V3I, T4S, S37N, L63P  . 2009 - M184V  . 2010 - V106I   Cumulative Genotype:  RTI Resistance Mutations: M184V NNRTI Resistance Mutations: V106I  Nucleoside Reverse Transcriptase Inhibitors abacavir (ABC) Low-Level Resistance zidovudine (AZT) Susceptible emtricitabine (FTC) High-Level Resistance lamivudine (3TC) High-Level Resistance tenofovir (TDF) Susceptible  Non-nucleoside Reverse Transcriptase Inhibitors doravirine  (DOR) Potential Low-Level Resistance efavirenz (EFV) Susceptible etravirine (ETR) Potential Low-Level Resistance nevirapine (NVP) Potential Low-Level Resistance rilpivirine (RPV) Potential Low-Level Resistance  PI Major Resistance Mutations: None PI Accessory Resistance Mutations: None  Protease Inhibitors atazanavir/r (ATV/r) Susceptible darunavir/r (DRV/r) Susceptible lopinavir/r (LPV/r) Susceptible  Subjective:  CC: HIV follow up care.  No complaints today.     HPI: Cathy Smith is here for routine follow up care for HIV. She has had long-standing perfectly controlled HIV on Genvoya + Prezista with last VL < 20 and CD4 411 April 2021.   Started gabapentin recently for pain - so so with response. It makes her drowsy.   She has received her [place] booster recently.   She has a history of vulvovaginal dysplasia (VIN III). Vaginal pap smear done 03/2020 - next appt due May 2022.     Review of Systems  Constitutional: Negative for chills and fever.  HENT: Negative for tinnitus.   Eyes: Negative for blurred vision and photophobia.  Respiratory: Negative for cough and sputum production.   Cardiovascular: Negative for chest pain.  Gastrointestinal: Negative for diarrhea, nausea and vomiting.  Genitourinary: Negative for dysuria.  Musculoskeletal:       B/l leg pain, improved a little with gabapentin  Skin: Negative for rash.  Neurological: Positive for tingling. Negative for headaches.    Past Medical History:  Diagnosis Date  . Coronary artery disease   . History of condyloma acuminatum   . History of uterine fibroid   . History of vaginal dysplasia    VAIN III--  oncologist-  dr Denman George  . History of vulvar dysplasia    VIN III  gyn-oncologist-  dr Denman George  . HIV infection ([place]) [facility] hatcher  . Hypertension   . Hypertriglyceridemia without hypercholesterolemia   . PONV (postoperative nausea and vomiting)   . VIN III (vulvar  intraepithelial neoplasia III)   . Wears glasses     Outpatient Medications Prior to Visit  Medication Sig Dispense Refill  . acetaminophen (TYLENOL) 325 MG tablet Take 650 mg by mouth every 6 (six) hours as needed.    Marland Kitchen aspirin EC 81 MG tablet Take 81 mg by mouth daily. Swallow whole.    Marland Kitchen atorvastatin (LIPITOR) 20 MG tablet TAKE 1 TABLET(20 MG) BY MOUTH DAILY AT 6 PM 90 tablet 2  . furosemide (LASIX) 40 MG tablet TAKE 1 TABLET(40 MG) BY MOUTH DAILY 90 tablet 3  . gabapentin (NEURONTIN) 300 MG capsule Take 1 capsule (300 mg total) by mouth 3 (three) times daily. 270 capsule 3  . metoprolol tartrate (LOPRESSOR) 25 MG tablet TAKE 1 AND 1/2 TABLETS(37.5 MG) BY MOUTH TWICE DAILY 270 tablet 2  . GENVOYA 150-150-200-10 MG TABS tablet TAKE 1 TABLET BY MOUTH DAILY WITH BREAKFAST 30 tablet 1  . PREZISTA 800 MG tablet TAKE 1 TABLET(800 MG) BY MOUTH DAILY 30 tablet 1  . cyclobenzaprine (FLEXERIL) 10 MG tablet  (Patient not taking: No sig reported)     No facility-administered medications prior to visit.     No Known Allergies  Social History   Tobacco Use  . Smoking status: Never Smoker  . Smokeless tobacco: Never Used  Vaping Use  . Vaping Use: Never used  Substance Use Topics  . Alcohol use: No    Alcohol/week: 0.0 standard drinks  . Drug use: No    Family History  Problem Relation Age of Onset  . Cataracts Mother   . Hypertension Father   . CAD Paternal Grandfather   . Colon cancer Neg Hx     Social History   Substance and Sexual Activity  Sexual Activity Yes  . Partners: Male  . Birth control/protection: Condom   Comment: pt. given condoms     Objective:   Vitals:   10/24/20 0920 10/24/20 0924  BP:  129/79  Pulse:  73  Temp:  98.2 F (36.8 C)  TempSrc:  Oral  SpO2:  98%  Weight: 157 lb (71.2 kg)   Height: '5\' 2"'  (1.575 m)    Body mass index is 28.72 kg/m.   Physical Exam Constitutional:      Appearance: Normal appearance. She is not ill-appearing.   HENT:     Mouth/Throat:     Mouth: Mucous membranes are moist.     Pharynx: Oropharynx is clear.  Eyes:     General: No scleral icterus. Pulmonary:     Effort: Pulmonary effort is normal.  Neurological:     Mental Status: She is oriented to person, place, and time.  Psychiatric:        Mood and Affect: Mood normal.        Thought Content: Thought content normal.     Lab Results Lab Results  Component Value Date   WBC 3.4 (L) 08/01/2019   HGB 12.9 08/01/2019   HCT 37.6 08/01/2019   MCV 96.9 08/01/2019   PLT 172 08/01/2019    Lab Results  Component Value Date   CREATININE 0.94 08/01/2019   BUN 12 08/01/2019   NA 142 08/01/2019   K 4.0 08/01/2019   CL 104 08/01/2019   CO2 27 08/01/2019  Lab Results  Component Value Date   ALT 58 (H) 03/01/2020   AST 52 (H) 03/01/2020   ALKPHOS 98 03/01/2020   BILITOT 0.4 03/01/2020    Lab Results  Component Value Date   CHOL 143 03/01/2020   HDL 44 03/01/2020   LDLCALC 69 03/01/2020   TRIG 175 (H) 03/01/2020   CHOLHDL 3.3 03/01/2020   HIV 1 RNA Quant  Date Value  08/12/2020 <20 Copies/mL  02/19/2020 <20 NOT DETECTED copies/mL  08/01/2019 <20 NOT DETECTED copies/mL   CD4 T Cell Abs (/uL)  Date Value  08/12/2020 448  02/19/2020 411  05/09/2018 300 (L)     Assessment & Plan:   Problem List Items Addressed This Visit      Unprioritized   VIN III (vulvar intraepithelial neoplasia III) - Primary    HPV noted on vaginal pap May 2021. Follows with Dr. Denman George and planned next exam in May 2022. We discussed this plan today.       Human immunodeficiency virus (HIV) disease ([place]) (Chronic)    Doing well on Genvoya and Prezista. No labs needed today as she had them in October. She can plan to return in 6 months.   Vaccination counseling discussed at today's visit. Updated as outlined per recommendations for [facility] at today's visit. All are updated including Pfizer booster   Last cervical cancer screening:  [facility] PAP          PAP SMEAR-Modifier (Yearly) Next due on 04/03/2021   04/03/2020  Cytology - PAP   Only the first 1 history entries have been loaded, but more history exists.         Results were: abnormal with + HPV   Contraception: status post hysterectomy. No family planning or pre-conception counseling needed at this time.  Last Mammogram: [facility] Mammogram         MAMMOGRAM (Every 2 Years) Next due on 10/07/2022   10/07/2020  MM 3D SCREEN BREAST BILATERAL   Only the first 1 history entries have been loaded, but more history exists.         Results were: normal Last colon cancer screening: [facility] Colonoscopy         COLONOSCOPY (Every 10 Years) Next due on 01/27/2025   01/28/2015  COLONOSCOPY         Depression screening discussed today - no needs at this time.  No dental needs identified today.        Relevant Medications   elvitegravir-cobicistat-emtricitabine-tenofovir (GENVOYA) 150-150-200-10 MG TABS tablet   darunavir (PREZISTA) 800 MG tablet      Janene Madeira, MSN, NP-C [facility] for Infect[facility] [place] Pager: [phone] Office: [phone]  10/24/20  9:40 AM

## 2020-10-29 ENCOUNTER — Ambulatory Visit (HOSPITAL_COMMUNITY)
Admission: EM | Admit: 2020-10-29 | Discharge: 2020-10-29 | Disposition: A | Discharge status: Home / Self Care | Payer: Medicare HMO | Attending: Student | Admitting: Student

## 2020-10-29 ENCOUNTER — Encounter (HOSPITAL_COMMUNITY): Payer: Self-pay | Admitting: Emergency Medicine

## 2020-10-29 DIAGNOSIS — L509 Urticaria, unspecified: Secondary | ICD-10-CM | POA: Diagnosis not present

## 2020-10-29 MED ORDER — TRIAMCINOLONE ACETONIDE 0.1 % EX CREA: 1.0000 "application " | TOPICAL_CREAM | Freq: Two times a day (BID) | CUTANEOUS | 0 refills | Status: DC

## 2020-10-29 MED ORDER — PREDNISONE 10 MG PO TABS: 20.0000 mg | ORAL_TABLET | Freq: Every day | ORAL | 0 refills | Status: DC

## 2020-10-29 NOTE — Discharge Instructions (Addendum)
For your rash, use the triamcinolone cream 1-2x daily. Only apply to affected skin. Don't apply to face or private area.

## 2020-10-29 NOTE — ED Triage Notes (Signed)
Pt presents with rash on R arm and side. Denies pain or burning sensation, only itches. Denies any changes to soaps, lotions or detergents.

## 2020-10-29 NOTE — ED Provider Notes (Signed)
[facility]    CSN: [id number] Arrival date & time: 10/29/20  1542      History   Chief Complaint Chief Complaint  Patient presents with  . Rash    HPI Cathy Smith is a 54 y.o. female presenting for rash on right arm and side. She has a history of CAD, HIV, hypertension, peripheral vascular disease. She states that she's had 3 days of itchy red rash on her upper right arm and her left calf. It started as a small spot on her lower back but this has gotten better. Denies trying new products, soaps, lotions, detergents, etc lately. Denies going outside or contact with plants. Denies fevers/chills, denies pain. Denies shortness of breath, facial swelling, lip swelling, etc. Feeling well otherwise. Has been trying vitamin E oil with minimal improvement.  HPI  Past Medical History:  Diagnosis Date  . Coronary artery disease   . History of condyloma acuminatum   . History of uterine fibroid   . History of vaginal dysplasia    VAIN III--  oncologist-  dr Andrey Farmer  . History of vulvar dysplasia    VIN III   gyn-oncologist-  dr Andrey Farmer  . HIV infection ([facility]) [facility]  dr hatcher  . Hypertension   . Hypertriglyceridemia without hypercholesterolemia   . PONV (postoperative nausea and vomiting)   . VIN III (vulvar intraepithelial neoplasia III)   . Wears glasses     Patient Active Problem List   Diagnosis Date Noted  . Claudication in peripheral vascular disease ([facility]) 03/01/2020  . Prediabetes 12/03/2019  . Chronic pain of lower extremity 12/03/2019  . Neuropathy 08/17/2019  . Suspected deep vein thrombosis (DVT) 04/09/2019  . AIN grade III 01/18/2019  . Left leg pain 12/14/2018  . 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 LGSIL Pap smear of vagina 04/28/2017  . 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 History of fusion of cervical spine 08/27/2015  . 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 Physical exam, annual 09/06/2014  . Alopecia  of scalp 09/06/2014  . Hypertriglyceridemia without hypercholesterolemia 08/08/2014  . Essential hypertension 06/12/2009  . MONILIASIS, ORAL 09/24/2008  . HX, PERSONAL, PAST NONCOMPLIANCE 11/24/2006  . Human immunodeficiency virus (HIV) disease ([facility]) 09/15/2000  . HYSTERECTOMY, HX OF 11/10/1983    Past Surgical History:  Procedure Laterality Date  . ABDOMINAL HYSTERECTOMY  1995 approx  . ANTERIOR CERVICAL DECOMP/DISCECTOMY FUSION  11-30-2009   C4 -- C6  . CO2 LASER APPLICATION Bilateral 07/23/2015   Procedure: BILATERAL CO2 LASER ABLATION OF THE VULVA;  Surgeon: Adolphus Birchwood, MD;  Location: [facility];  Service: Gynecology;  Laterality: Bilateral;  . CO2 LASER APPLICATION N/A 02/25/2016   Procedure: CO2 LASER OF THE VULVAR;  Surgeon: Adolphus Birchwood, MD;  Location: [facility];  Service: Gynecology;  Laterality: N/A;  . CO2 LASER APPLICATION N/A 05/04/2017   Procedure: CO2 LASER VAPORIZATION OF THE VULVA;  Surgeon: Adolphus Birchwood, MD;  Location: [facility];  Service: Gynecology;  Laterality: N/A;  . CO2 LASER APPLICATION N/A 12/27/2018   Procedure: CO2 LASER APPLICATION OF VULVA;  Surgeon: Adolphus Birchwood, MD;  Location: [facility] [place];  Service: Gynecology;  Laterality: N/A;  . COLONOSCOPY  01-28-2015  . CORONARY ARTERY BYPASS GRAFT N/A 11/25/2017   Procedure: CORONARY ARTERY BYPASS GRAFTING (CABG), ON PUMP, TIMES THREE, USING LEFT INTERNAL MAMMARY ARTERY AND ENDOSCOPICALLY HARVESTED LEFT GREATER SAPHENOUS VEIN;  Surgeon: Purcell Nails, MD;  Location: [facility];  Service: Open Heart Surgery;  Laterality: N/A;  . I & D LEFT BUTTOCK ABSCESS  04-28-2001  . LEFT HEART CATH AND CORONARY ANGIOGRAPHY N/A 11/22/2017   Procedure: LEFT HEART CATH AND CORONARY ANGIOGRAPHY;  Surgeon: Runell GessBerry, Jonathan J, MD;  [facility];  Service: Cardiovascular;  Laterality: N/A;  . TEE WITHOUT CARDIOVERSION N/A 11/25/2017   Procedure: TRANSESOPHAGEAL  ECHOCARDIOGRAM (TEE);  Surgeon: Purcell Nailswen, Clarence H, MD;  [facility];  Service: Open Heart Surgery;  Laterality: N/A;  . VULVECTOMY Right 05/06/2015   Procedure: WIDE LOCAL  EXCISION  OF RIGHT VULVA;  Surgeon: Adolphus BirchwoodEmma Rossi, MD;  [facility];  Service: Gynecology;  Laterality: Right;  Marland Kitchen. VULVECTOMY N/A 05/04/2017   Procedure: WIDE EXCISION VULVECTOMY;  Surgeon: Adolphus Birchwoodossi, Emma, MD;  [facility] [place];  Service: Gynecology;  Laterality: N/A;  . VULVECTOMY N/A 12/27/2018   Procedure: WIDE EXCISION VULVECTOMY;  Surgeon: Adolphus Birchwoodossi, Emma, MD;  [facility] [place];  Service: Gynecology;  Laterality: N/A;    OB History    Gravida  1   Para      Term      Preterm      AB  1   Living        SAB      IAB  1   Ectopic      Multiple      Live Births               Home Medications    Prior to Admission medications   Medication Sig Start Date End Date Taking? Authorizing Provider  aspirin EC 81 MG tablet Take 81 mg by mouth daily. Swallow whole.    [provider]  atorvastatin (LIPITOR) 20 MG tablet TAKE 1 TABLET(20 MG) BY MOUTH DAILY AT 6 PM 05/02/20   Runell GessBerry, Jonathan J, MD  darunavir (PREZISTA) 800 MG tablet Take 1 tablet (800 mg total) by mouth daily with breakfast. 10/24/20   Blanchard Kelchixon, Stephanie N, NP  elvitegravir-cobicistat-emtricitabine-tenofovir (GENVOYA) 150-150-200-10 MG TABS tablet Take 1 tablet by mouth daily with breakfast. 10/24/20   Blanchard Kelchixon, Stephanie N, NP  furosemide (LASIX) 40 MG tablet TAKE 1 TABLET(40 MG) BY MOUTH DAILY 03/05/20   Kallie LocksStroud, Natalie M, FNP  gabapentin (NEURONTIN) 300 MG capsule Take 1 capsule (300 mg total) by mouth 3 (three) times daily. 10/15/20   Kallie LocksStroud, Natalie M, FNP  metoprolol tartrate (LOPRESSOR) 25 MG tablet TAKE 1 AND 1/2 TABLETS(37.5 MG) BY MOUTH TWICE DAILY 06/13/20   Runell GessBerry, Jonathan J, MD  triamcinolone (KENALOG) 0.1 % Apply 1 application topically 2 (two) times daily. 10/29/20   Rhys MartiniGraham,  Jahiem Franzoni E, PA-C    Family History Family History  Problem Relation Age of Onset  . Cataracts Mother   . Hypertension Father   . CAD Paternal Grandfather   . Colon cancer Neg Hx     Social History Social History   Tobacco Use  . Smoking status: Never Smoker  . Smokeless tobacco: Never Used  Vaping Use  . Vaping Use: Never used  Substance Use Topics  . Alcohol use: No    Alcohol/week: 0.0 standard drinks  . Drug use: No     Allergies   Patient has no known allergies.   Review of Systems Review of Systems  Skin: Positive for rash.  All other systems reviewed and are negative.    Physical Exam Triage Vital Signs ED Triage Vitals  Enc Vitals Group  BP 10/29/20 1641 132/74     Pulse Rate 10/29/20 1641 72     Resp 10/29/20 1641 16     Temp 10/29/20 1641 98 F (36.7 C)     Temp Source 10/29/20 1641 Oral     SpO2 10/29/20 1641 98 %     Weight --      Height --      Head Circumference --      Peak Flow --      Pain Score 10/29/20 1638 0     Pain Loc --      Pain Edu? --      Excl. in GC? --    No data found.  Updated Vital Signs BP 132/74 (BP Location: Right Arm)   Pulse 72   Temp 98 F (36.7 C) (Oral)   Resp 16   SpO2 98%   Visual Acuity Right Eye Distance:   Left Eye Distance:   Bilateral Distance:    Right Eye Near:   Left Eye Near:    Bilateral Near:     Physical Exam Vitals reviewed.  Constitutional:      Appearance: Normal appearance.  Cardiovascular:     Rate and Rhythm: Normal rate and regular rhythm.     Heart sounds: Normal heart sounds.  Pulmonary:     Effort: Pulmonary effort is normal.     Breath sounds: Normal breath sounds.  Skin:    Comments: Right inner arm with two erythematous wheals, 2x3cm each. Left posterior calf with 2x2cm area of erythema and wheals. No other rashes or erythema.  Neurological:     General: No focal deficit present.     Mental Status: She is alert and oriented to person, place, and time.   Psychiatric:        Mood and Affect: Mood normal.        Behavior: Behavior normal.        Thought Content: Thought content normal.        Judgment: Judgment normal.      UC Treatments / Results  Labs (all labs ordered are listed, but only abnormal results are displayed) Labs Reviewed - No data to display  EKG   Radiology No results found.  Procedures Procedures (including critical care time)  Medications Ordered in UC Medications - No data to display  Initial Impression / Assessment and Plan / UC Course  I have reviewed the triage vital signs and the nursing notes.  Pertinent labs & imaging results that were available during my care of the patient were reviewed by me and considered in my medical decision making (see chart for details).     Triamcinolone cream as below for urticarial rash. Rec trial of stopping perfume and other scented products. Return precautions- worsening of rash despite treatment; fevers, shortness of breath, facial swelling, chest pain, etc.   Final Clinical Impressions(s) / UC Diagnoses   Final diagnoses:  Urticaria     Discharge Instructions     For your rash, use the triamcinolone cream 1-2x daily. Only apply to affected skin. Don't apply to face or private area.     ED Prescriptions    Medication Sig Dispense Auth. Provider   predniSONE (DELTASONE) 10 MG tablet  (Status: Discontinued) Take 2 tablets (20 mg total) by mouth daily. 10 tablet Rhys Martini, PA-C   triamcinolone (KENALOG) 0.1 % Apply 1 application topically 2 (two) times daily. 30 g Rhys Martini, PA-C     PDMP not reviewed  this encounter.   Rhys Martini, PA-C 10/29/20 1737

## 2020-11-15 ENCOUNTER — Ambulatory Visit (INDEPENDENT_AMBULATORY_CARE_PROVIDER_SITE_OTHER): Payer: Medicare HMO | Admitting: Family Medicine

## 2020-11-15 ENCOUNTER — Other Ambulatory Visit: Payer: Self-pay

## 2020-11-15 ENCOUNTER — Encounter: Payer: Self-pay | Admitting: Family Medicine

## 2020-11-15 VITALS — BP 127/72 | HR 73 | Temp 97.8°F | Ht 62.0 in | Wt 163.0 lb

## 2020-11-15 DIAGNOSIS — Z09 Encounter for follow-up examination after completed treatment for conditions other than malignant neoplasm: Secondary | ICD-10-CM

## 2020-11-15 DIAGNOSIS — L299 Pruritus, unspecified: Secondary | ICD-10-CM | POA: Diagnosis not present

## 2020-11-15 DIAGNOSIS — M79606 Pain in leg, unspecified: Secondary | ICD-10-CM | POA: Diagnosis not present

## 2020-11-15 DIAGNOSIS — G629 Polyneuropathy, unspecified: Secondary | ICD-10-CM

## 2020-11-15 DIAGNOSIS — Z0000 Encounter for general adult medical examination without abnormal findings: Secondary | ICD-10-CM

## 2020-11-15 DIAGNOSIS — G8929 Other chronic pain: Secondary | ICD-10-CM | POA: Diagnosis not present

## 2020-11-15 DIAGNOSIS — I10 Essential (primary) hypertension: Secondary | ICD-10-CM | POA: Diagnosis not present

## 2020-11-15 DIAGNOSIS — L309 Dermatitis, unspecified: Secondary | ICD-10-CM

## 2020-11-15 MED ORDER — HYDROXYZINE HCL 10 MG PO TABS: 10.0000 mg | ORAL_TABLET | Freq: Three times a day (TID) | ORAL | 1 refills | Status: DC | PRN

## 2020-11-15 MED ORDER — TRIAMCINOLONE ACETONIDE 0.1 % EX CREA
1.0000 | TOPICAL_CREAM | Freq: Two times a day (BID) | CUTANEOUS | 6 refills | Status: DC
Start: 2020-11-15 — End: 2022-03-03

## 2020-11-15 NOTE — Progress Notes (Signed)
[facility]   Hospital Follow Up  Subjective:  Patient ID: Cathy Smith, female    DOB: 07-Jan-1966  Age: 55 y.o. MRN: [id number]  CC:  Chief Complaint  Patient presents with  . Follow-up    Rash on neck on arm  rash  on back , icthing , no pain     HPI Cathy CODERRE is a 55 year old female who presents for Hospital Follow Up today.    Patient Active Problem List   Diagnosis Date Noted  . Claudication in peripheral vascular disease (HCC) 03/01/2020  . Prediabetes 12/03/2019  . Chronic pain of lower extremity 12/03/2019  . Neuropathy 08/17/2019  . Suspected deep vein thrombosis (DVT) 04/09/2019  . AIN grade III 01/18/2019  . Left leg pain 12/14/2018  . 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 LGSIL Pap smear of vagina 04/28/2017  . 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 History of fusion of cervical spine 08/27/2015  . 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 Physical exam, annual 09/06/2014  . Alopecia of scalp 09/06/2014  . Hypertriglyceridemia without hypercholesterolemia 08/08/2014  . Essential hypertension 06/12/2009  . MONILIASIS, ORAL 09/24/2008  . HX, PERSONAL, PAST NONCOMPLIANCE 11/24/2006  . Human immunodeficiency virus (HIV) disease (HCC) 09/15/2000  . HYSTERECTOMY, HX OF 11/10/1983   Current Status: Since her last office visit, she has had an ED visit for Skin Rash on 10/29/2020, which she began to use Triamcinolone cream for symptoms. She states that cream is very effective. She is also taking Diphenhydramine for itching. Today, she is doing well with no complaints. She continues to have chronic bilateral leg pain, which she rates at h/10 today. She is also taking OTC pain medication to aid in pain relief. She denies visual changes, chest pain, cough, shortness of breath, heart palpitations, and falls. She has occasional headaches and dizziness with position changes. Denies severe headaches, confusion,  seizures, double vision, and blurred vision, nausea and vomiting.   She denies fevers, chills, fatigue, recent infections, weight loss, and night sweats. She has not had any headaches, visual changes, dizziness, and falls. No chest pain, heart palpitations, cough and shortness of breath reported. Denies GI problems such as nausea, vomiting, diarrhea, and constipation. She has no reports of blood in stools, dysuria and hematuria. No depression or anxiety reported today. She is taking all medications as prescribed.  Past Medical History:  Diagnosis Date  . Coronary artery disease   . History of condyloma acuminatum   . History of uterine fibroid   . History of vaginal dysplasia    VAIN III--  oncologist-  dr Andrey Farmer  . History of vulvar dysplasia    VIN III   gyn-oncologist-  dr Andrey Farmer  . HIV infection (HCC[facility]-  dr hatcher  . Hypertension   . Hypertriglyceridemia without hypercholesterolemia   . PONV (postoperative nausea and vomiting)   . VIN III (vulvar intraepithelial neoplasia III)   . Wears glasses     Past Surgical History:  Procedure Laterality Date  . ABDOMINAL HYSTERECTOMY  1995 approx  . ANTERIOR CERVICAL DECOMP/DISCECTOMY FUSION  11-30-2009   C4 -- C6  . CO2 LASER APPLICATION Bilateral 07/23/2015   Procedure: BILATERAL CO2 LASER ABLATION OF THE VULVA;  Surgeon: Adolphus Birchwood, MD;  Location: [facility];  Service: Gynecology;  Laterality: Bilateral;  . CO2 LASER APPLICATION N/A 02/25/2016   Procedure: CO2 LASER OF THE  VULVAR;  Surgeon: Adolphus Birchwood, MD;  Location: [facility];  Service: Gynecology;  Laterality: N/A;  . CO2 LASER APPLICATION N/A 05/04/2017   Procedure: CO2 LASER VAPORIZATION OF THE VULVA;  Surgeon: Adolphus Birchwood, MD;  Location: [facility];  Service: Gynecology;  Laterality: N/A;  . CO2 LASER APPLICATION N/A 12/27/2018   Procedure: CO2 LASER APPLICATION OF VULVA;  Surgeon: Adolphus Birchwood, MD;   Location: [facility];  Service: Gynecology;  Laterality: N/A;  . COLONOSCOPY  01-28-2015  . CORONARY ARTERY BYPASS GRAFT N/A 11/25/2017   Procedure: CORONARY ARTERY BYPASS GRAFTING (CABG), ON PUMP, TIMES THREE, USING LEFT INTERNAL MAMMARY ARTERY AND ENDOSCOPICALLY HARVESTED LEFT GREATER SAPHENOUS VEIN;  Surgeon: Purcell Nails, MD;  Location: [facility] OR;  Service: Open Heart Surgery;  Laterality: N/A;  . I & D LEFT BUTTOCK ABSCESS  04-28-2001  . LEFT HEART CATH AND CORONARY ANGIOGRAPHY N/A 11/22/2017   Procedure: LEFT HEART CATH AND CORONARY ANGIOGRAPHY;  Surgeon: Runell Gess, MD;  Location: [facility];  Service: Cardiovascular;  Laterality: N/A;  . TEE WITHOUT CARDIOVERSION N/A 11/25/2017   Procedure: TRANSESOPHAGEAL ECHOCARDIOGRAM (TEE);  Surgeon: Purcell Nails, MD;  Location: [facility] OR;  Service: Open Heart Surgery;  Laterality: N/A;  . VULVECTOMY Right 05/06/2015   Procedure: WIDE LOCAL  EXCISION  OF RIGHT VULVA;  Surgeon: Adolphus Birchwood, MD;  Location: [facility] [place];  Service: Gynecology;  Laterality: Right;  Marland Kitchen VULVECTOMY N/A 05/04/2017   Procedure: WIDE EXCISION VULVECTOMY;  Surgeon: Adolphus Birchwood, MD;  Location: [facility];  Service: Gynecology;  Laterality: N/A;  . VULVECTOMY N/A 12/27/2018   Procedure: WIDE EXCISION VULVECTOMY;  Surgeon: Adolphus Birchwood, MD;  Location: [facility];  Service: Gynecology;  Laterality: N/A;    Family History  Problem Relation Age of Onset  . Cataracts Mother   . Hypertension Father   . CAD Paternal Grandfather   . Colon cancer Neg Hx     Social History   Socioeconomic History  . Marital status: Married    Spouse name: Not on file  . Number of children: Not on file  . Years of education: Not on file  . Highest education level: Not on file  Occupational History  . Not on file  Tobacco Use  . Smoking status: Never Smoker  . Smokeless tobacco: Never Used  Vaping Use  . Vaping Use: Never  used  Substance and Sexual Activity  . Alcohol use: No    Alcohol/week: 0.0 standard drinks  . Drug use: No  . Sexual activity: Yes    Partners: Male    Birth control/protection: Condom    Comment: pt. given condoms  Other Topics Concern  . Not on file  Social History Narrative  . Not on file   Social Determinants of Health   Financial Resource Strain: Not on file  Food Insecurity: Not on file  Transportation Needs: Not on file  Physical Activity: Not on file  Stress: Not on file  Social Connections: Not on file  Intimate Partner Violence: Not on file    Outpatient Medications Prior to Visit  Medication Sig Dispense Refill  . aspirin EC 81 MG tablet Take 81 mg by mouth daily. Swallow whole.    Marland Kitchen atorvastatin (LIPITOR) 20 MG tablet TAKE 1 TABLET(20 MG) BY MOUTH DAILY AT 6 PM 90 tablet 2  . darunavir (PREZISTA) 800 MG tablet Take 1 tablet (800 mg total) by mouth daily with breakfast.  30 tablet 11  . elvitegravir-cobicistat-emtricitabine-tenofovir (GENVOYA) 150-150-200-10 MG TABS tablet Take 1 tablet by mouth daily with breakfast. 30 tablet 11  . furosemide (LASIX) 40 MG tablet TAKE 1 TABLET(40 MG) BY MOUTH DAILY 90 tablet 3  . gabapentin (NEURONTIN) 300 MG capsule Take 1 capsule (300 mg total) by mouth 3 (three) times daily. 270 capsule 3  . metoprolol tartrate (LOPRESSOR) 25 MG tablet TAKE 1 AND 1/2 TABLETS(37.5 MG) BY MOUTH TWICE DAILY 270 tablet 2  . triamcinolone (KENALOG) 0.1 % Apply 1 application topically 2 (two) times daily. 30 g 0   No facility-administered medications prior to visit.    No Known Allergies  ROS Review of Systems  Constitutional: Negative.   HENT: Negative.   Eyes: Negative.   Respiratory: Negative.   Cardiovascular: Negative.   Endocrine: Negative.   Genitourinary: Negative.   Musculoskeletal: Positive for arthralgias (generalized).       Chronic bilateral leg pain  Skin: Positive for rash (scattered over entire body).   Allergic/Immunologic: Negative.   Neurological: Positive for dizziness (occasional ) and headaches (occasional).  Hematological: Negative.   Psychiatric/Behavioral: Negative.       Objective:    Physical Exam Vitals and nursing note reviewed.  Constitutional:      Appearance: Normal appearance.  HENT:     Head: Normocephalic and atraumatic.     Nose: Nose normal.  Cardiovascular:     Rate and Rhythm: Normal rate and regular rhythm.     Pulses: Normal pulses.     Heart sounds: Normal heart sounds.  Pulmonary:     Effort: Pulmonary effort is normal.     Breath sounds: Normal breath sounds.  Abdominal:     General: Bowel sounds are normal.     Palpations: Abdomen is soft.  Musculoskeletal:        General: Normal range of motion.     Cervical back: Normal range of motion and neck supple.  Skin:    General: Skin is warm and dry.  Neurological:     General: No focal deficit present.     Mental Status: She is alert and oriented to person, place, and time.     Motor: Weakness (bilateral lower extremity) present.  Psychiatric:        Mood and Affect: Mood normal.        Behavior: Behavior normal.        Thought Content: Thought content normal.        Judgment: Judgment normal.     BP 127/72 (BP Location: Left Arm, Patient Position: Sitting)   Pulse 73   Temp 97.8 F (36.6 C) (Temporal)   Ht 5\' 2"  (1.575 m)   Wt 163 lb (73.9 kg)   SpO2 93%   BMI 29.81 kg/m  Wt Readings from Last 3 Encounters:  11/15/20 163 lb (73.9 kg)  10/24/20 157 lb (71.2 kg)  08/29/20 159 lb 6.4 oz (72.3 kg)     There are no preventive care reminders to display for this patient.  There are no preventive care reminders to display for this patient.  Lab Results  Component Value Date   TSH 1.050 08/16/2019   Lab Results  Component Value Date   WBC 3.4 (L) 08/01/2019   HGB 12.9 08/01/2019   HCT 37.6 08/01/2019   MCV 96.9 08/01/2019   PLT 172 08/01/2019   Lab Results  Component Value  Date   NA 142 08/01/2019   K 4.0 08/01/2019   CO2 27 08/01/2019  GLUCOSE 126 (H) 08/01/2019   BUN 12 08/01/2019   CREATININE 0.94 08/01/2019   BILITOT 0.4 03/01/2020   ALKPHOS 98 03/01/2020   AST 52 (H) 03/01/2020   ALT 58 (H) 03/01/2020   PROT 8.1 03/01/2020   ALBUMIN 4.9 03/01/2020   CALCIUM 9.6 08/01/2019   ANIONGAP 8 12/27/2018   Lab Results  Component Value Date   CHOL 143 03/01/2020   Lab Results  Component Value Date   HDL 44 03/01/2020   Lab Results  Component Value Date   LDLCALC 69 03/01/2020   Lab Results  Component Value Date   TRIG 175 (H) 03/01/2020   Lab Results  Component Value Date   CHOLHDL 3.3 03/01/2020   Lab Results  Component Value Date   HGBA1C 6.3 (A) 12/01/2019   Assessment & Plan:   1. Hospital discharge follow-up  2. Eczema, unspecified type - hydrOXYzine (ATARAX/VISTARIL) 10 MG tablet; Take 1 tablet (10 mg total) by mouth 3 (three) times daily as needed.  Dispense: 90 tablet; Refill: 1 - triamcinolone (KENALOG) 0.1 %; Apply 1 application topically 2 (two) times daily.  Dispense: 45 each; Refill: 6  3. Itching We will initiate Hydroxyzine today.  - hydrOXYzine (ATARAX/VISTARIL) 10 MG tablet; Take 1 tablet (10 mg total) by mouth 3 (three) times daily as needed.  Dispense: 90 tablet; Refill: 1 - triamcinolone (KENALOG) 0.1 %; Apply 1 application topically 2 (two) times daily.  Dispense: 45 each; Refill: 6  4. Essential hypertension The current medical regimen is effective; blood pressure is stable at 127/72 today; continue present plan and medications as prescribed. She will continue to take medications as prescribed, to decrease high sodium intake, excessive alcohol intake, increase potassium intake, smoking cessation, and increase physical activity of at least 30 minutes of cardio activity daily. She will continue to follow Heart Healthy or DASH diet. . 5. Neuropathy  6. Chronic pain of lower extremity, unspecified  laterality Stable continue Gabapentin as prescribed.   7. Healthcare maintenance - CBC with Differential - Comprehensive metabolic panel - Lipid Panel - TSH - Vitamin B12 - Vitamin D, 25-hydroxy  8. Follow up She will follow up in 6 months.   Meds ordered this encounter  Medications  . hydrOXYzine (ATARAX/VISTARIL) 10 MG tablet    Sig: Take 1 tablet (10 mg total) by mouth 3 (three) times daily as needed.    Dispense:  90 tablet    Refill:  1  . triamcinolone (KENALOG) 0.1 %    Sig: Apply 1 application topically 2 (two) times daily.    Dispense:  45 each    Refill:  6    Orders Placed This Encounter  Procedures  . CBC with Differential  . Comprehensive metabolic panel  . Lipid Panel  . TSH  . Vitamin B12  . Vitamin D, 25-hydroxy    Referral Orders  No referral(s) requested today    Raliegh Ip, MSN, ANE, FNP-BC [facility] [address] [place], [place] [address] [phone] fax    Problem List Items Addressed This Visit      Cardiovascular and Mediastinum   Essential hypertension     Nervous and Auditory   Neuropathy     Other   Chronic pain of lower extremity    Other Visit Diagnoses    Hospital discharge follow-up    -  Primary   Eczema, unspecified type       Relevant Medications  hydrOXYzine (ATARAX/VISTARIL) 10 MG tablet   triamcinolone (KENALOG) 0.1 %   Itching       Relevant Medications   hydrOXYzine (ATARAX/VISTARIL) 10 MG tablet   triamcinolone (KENALOG) 0.1 %   Healthcare maintenance       Relevant Orders   CBC with Differential   Comprehensive metabolic panel   Lipid Panel   TSH   Vitamin B12   Vitamin D, 25-hydroxy   Follow up          Meds ordered this encounter  Medications  . hydrOXYzine (ATARAX/VISTARIL) 10 MG tablet    Sig: Take 1 tablet (10 mg total) by mouth 3 (three) times daily as needed.    Dispense:  90  tablet    Refill:  1  . triamcinolone (KENALOG) 0.1 %    Sig: Apply 1 application topically 2 (two) times daily.    Dispense:  45 each    Refill:  6    Follow-up: No follow-ups on file.    Kallie Locks, FNP

## 2020-11-15 NOTE — Patient Instructions (Signed)
Rash, Adult  A rash is a change in the color of your skin. A rash can also change the way your skin feels. There are many different conditions and factors that can cause a rash. Follow these instructions at home: The goal of treatment is to stop the itching and keep the rash from spreading. Watch for any changes in your symptoms. Let your doctor know about them. Follow these instructions to help with your condition: Medicine Take or apply over-the-counter and prescription medicines only as told by your doctor. These may include medicines:  To treat red or swollen skin (corticosteroid creams).  To treat itching.  To treat an allergy (oral antihistamines).  To treat very bad symptoms (oral corticosteroids).  Skin care  Put cool cloths (compresses) on the affected areas.  Do not scratch or rub your skin.  Avoid covering the rash. Make sure that the rash is exposed to air as much as possible. Managing itching and discomfort  Avoid hot showers or baths. These can make itching worse. A cold shower may help.  Try taking a bath with: ? Epsom salts. You can get these at your local pharmacy or grocery store. Follow the instructions on the package. ? Baking soda. Pour a small amount into the bath as told by your doctor. ? Colloidal oatmeal. You can get this at your local pharmacy or grocery store. Follow the instructions on the package.  Try putting baking soda paste onto your skin. Stir water into baking soda until it gets like a paste.  Try putting on a lotion that relieves itchiness (calamine lotion).  Keep cool and out of the sun. Sweating and being hot can make itching worse. General instructions   Rest as needed.  Drink enough fluid to keep your pee (urine) pale yellow.  Wear loose-fitting clothing.  Avoid scented soaps, detergents, and perfumes. Use gentle soaps, detergents, perfumes, and other cosmetic products.  Avoid anything that causes your rash. Keep a journal to  help track what causes your rash. Write down: ? What you eat. ? What cosmetic products you use. ? What you drink. ? What you wear. This includes jewelry.  Keep all follow-up visits as told by your doctor. This is important. Contact a doctor if:  You sweat at night.  You lose weight.  You pee (urinate) more than normal.  You pee less than normal, or you notice that your pee is a darker color than normal.  You feel weak.  You throw up (vomit).  Your skin or the whites of your eyes look yellow (jaundice).  Your skin: ? Tingles. ? Is numb.  Your rash: ? Does not go away after a few days. ? Gets worse.  You are: ? More thirsty than normal. ? More tired than normal.  You have: ? New symptoms. ? Pain in your belly (abdomen). ? A fever. ? Watery poop (diarrhea). Get help right away if:  You have a fever and your symptoms suddenly get worse.  You start to feel mixed up (confused).  You have a very bad headache or a stiff neck.  You have very bad joint pains or stiffness.  You have jerky movements that you cannot control (seizure).  Your rash covers all or most of your body. The rash may or may not be painful.  You have blisters that: ? Are on top of the rash. ? Grow larger. ? Grow together. ? Are painful. ? Are inside your nose or mouth.  You have a rash   that: ? Looks like purple pinprick-sized spots all over your body. ? Has a "bull's eye" or looks like a target. ? Is red and painful, causes your skin to peel, and is not from being in the sun too long. Summary  A rash is a change in the color of your skin. A rash can also change the way your skin feels.  The goal of treatment is to stop the itching and keep the rash from spreading.  Take or apply over-the-counter and prescription medicines only as told by your doctor.  Contact a doctor if you have new symptoms or symptoms that get worse.  Keep all follow-up visits as told by your doctor. This is  important. This information is not intended to replace advice given to you by your health care provider. Make sure you discuss any questions you have with your health care provider. Document Revised: 02/17/2019 Document Reviewed: 05/30/2018 Elsevier Patient Education  2020 [employer]. Pruritus Pruritus is an itchy feeling on the skin. One of the most common causes is dry skin, but many different things can cause itching. Most cases of itching do not require medical attention. Sometimes itchy skin can turn into a rash. Follow these instructions at home: Skin care   Apply moisturizing lotion to your skin as needed. Lotion that contains petroleum jelly is best.  Take medicines or apply medicated creams only as told by your health care provider. This may include: ? Corticosteroid cream. ? Anti-itch lotions. ? Oral antihistamines.  Apply a cool, wet cloth (cool compress) to the affected areas.  Take baths with one of the following: ? Epsom salts. You can get these at your local pharmacy or grocery store. Follow the instructions on the packaging. ? Baking soda. Pour a small amount into the bath as told by your health care provider. ? Colloidal oatmeal. You can get this at your local pharmacy or grocery store. Follow the instructions on the packaging.  Apply baking soda paste to your skin. To make the paste, stir water into a small amount of baking soda until it reaches a paste-like consistency.  Do not scratch your skin.  Do not take hot showers or baths, which can make itching worse. A cool shower may help with itching as long as you apply moisturizing lotion after the shower.  Do not use scented soaps, detergents, perfumes, and cosmetic products. Instead, use gentle, unscented versions of these items. General instructions  Avoid wearing tight clothes.  Keep a journal to help find out what is causing your itching. Write down: ? What you eat and drink. ? What cosmetic products you  use. ? What soaps or detergents you use. ? What you wear, including jewelry.  Use a humidifier. This keeps the air moist, which helps to prevent dry skin.  Be aware of any changes in your itchiness. Contact a health care provider if:  The itching does not go away after several days.  You are unusually thirsty or urinating more than normal.  Your skin tingles or feels numb.  Your skin or the white parts of your eyes turn yellow (jaundice).  You feel weak.  You have any of the following: ? Night sweats. ? Tiredness (fatigue). ? Weight loss. ? Abdominal pain. Summary  Pruritus is an itchy feeling on the skin. One of the most common causes is dry skin, but many different conditions and factors can cause itching.  Apply moisturizing lotion to your skin as needed. Lotion that contains petroleum jelly is  best.  Take medicines or apply medicated creams only as told by your health care provider.  Do not take hot showers or baths. Do not use scented soaps, detergents, perfumes, or cosmetic products. This information is not intended to replace advice given to you by your health care provider. Make sure you discuss any questions you have with your health care provider. Document Revised: 11/09/2017 Document Reviewed: 11/09/2017 Elsevier Patient Education  [employer]. Hydroxyzine capsules or tablets What is this medicine? HYDROXYZINE (hye [place] i zeen) is an antihistamine. This medicine is used to treat allergy symptoms. It is also used to treat anxiety and tension. This medicine can be used with other medicines to induce sleep before surgery. This medicine may be used for other purposes; ask your health care provider or pharmacist if you have questions. COMMON BRAND NAME(S): ANX, Atarax, Rezine, Vistaril What should I tell my health care provider before I take this medicine? They need to know if you have any of these conditions:  glaucoma  heart disease  history of irregular  heartbeat  kidney disease  liver disease  lung or breathing disease, like asthma  stomach or intestine problems  thyroid disease  trouble passing urine  an unusual or allergic reaction to hydroxyzine, cetirizine, other medicines, foods, dyes or preservatives  pregnant or trying to get pregnant  breast-feeding How should I use this medicine? Take this medicine by mouth with a full glass of water. Follow the directions on the prescription label. You may take this medicine with food or on an empty stomach. Take your medicine at regular intervals. Do not take your medicine more often than directed. Talk to your pediatrician regarding the use of this medicine in children. Special care may be needed. While this drug may be prescribed for children as young as 77 years of age for selected conditions, precautions do apply. Patients over 71 years old may have a stronger reaction and need a smaller dose. Overdosage: If you think you have taken too much of this medicine contact a poison control center or emergency room at once. NOTE: This medicine is only for you. Do not share this medicine with others. What if I miss a dose? If you miss a dose, take it as soon as you can. If it is almost time for your next dose, take only that dose. Do not take double or extra doses. What may interact with this medicine? Do not take this medicine with any of the following medications:  cisapride  dronedarone  pimozide  thioridazine This medicine may also interact with the following medications:  alcohol  antihistamines for allergy, cough, and cold  atropine  barbiturate medicines for sleep or seizures, like phenobarbital  certain antibiotics like erythromycin or clarithromycin  certain medicines for anxiety or sleep  certain medicines for bladder problems like oxybutynin, tolterodine  certain medicines for depression or psychotic disturbances  certain medicines for irregular heart  beat  certain medicines for Parkinson's disease like benztropine, trihexyphenidyl  certain medicines for seizures like phenobarbital, primidone  certain medicines for stomach problems like dicyclomine, hyoscyamine  certain medicines for travel sickness like scopolamine  ipratropium  narcotic medicines for pain  other medicines that prolong the QT interval (an abnormal heart rhythm) like dofetilide This list may not describe all possible interactions. Give your health care provider a list of all the medicines, herbs, non-prescription drugs, or dietary supplements you use. Also tell them if you smoke, drink alcohol, or use illegal drugs. Some items may  interact with your medicine. What should I watch for while using this medicine? Tell your doctor or health care professional if your symptoms do not improve. You may get drowsy or dizzy. Do not drive, use machinery, or do anything that needs mental alertness until you know how this medicine affects you. Do not stand or sit up quickly, especially if you are an older patient. This reduces the risk of dizzy or fainting spells. Alcohol may interfere with the effect of this medicine. Avoid alcoholic drinks. Your mouth may get dry. Chewing sugarless gum or sucking hard candy, and drinking plenty of water may help. Contact your doctor if the problem does not go away or is severe. This medicine may cause dry eyes and blurred vision. If you wear contact lenses you may feel some discomfort. Lubricating drops may help. See your eye doctor if the problem does not go away or is severe. If you are receiving skin tests for allergies, tell your doctor you are using this medicine. What side effects may I notice from receiving this medicine? Side effects that you should report to your doctor or health care professional as soon as possible:  allergic reactions like skin rash, itching or hives, swelling of the face, lips, or tongue  changes in  vision  confusion  fast, irregular heartbeat  seizures  tremor  trouble passing urine or change in the amount of urine Side effects that usually do not require medical attention (report to your doctor or health care professional if they continue or are bothersome):  constipation  drowsiness  dry mouth  headache  tiredness This list may not describe all possible side effects. Call your doctor for medical advice about side effects. You may report side effects to FDA at 1-800-FDA-1088. Where should I keep my medicine? Keep out of the reach of children. Store at room temperature between 15 and 30 degrees C (59 and 86 degrees F). Keep container tightly closed. Throw away any unused medicine after the expiration date. NOTE: This sheet is a summary. It may not cover all possible information. If you have questions about this medicine, talk to your doctor, pharmacist, or health care provider.  2020 Elsevier/Gold Standard (2018-10-17 13:19:55) Triamcinolone skin cream, ointment, lotion, or aerosol What is this medicine? TRIAMCINOLONE (trye am SIN [place]) is a corticosteroid. It is used on the skin to reduce swelling, redness, itching, and allergic reactions. This medicine may be used for other purposes; ask your health care provider or pharmacist if you have questions. COMMON BRAND NAME(S): Aristocort, Aristocort A, Aristocort HP, Cinalog, Cinolar, DERMASORB TA Complete, Flutex, Kenalog, Pediaderm TA, Sila III, SP Rx 228, Triacet, Trianex, Triderm What should I tell my health care provider before I take this medicine? They need to know if you have any of these conditions:  any active infection  large areas of burned or damaged skin  skin wasting or thinning  an unusual or allergic reaction to triamcinolone, corticosteroids, other medicines, foods, dyes, or preservatives  pregnant or trying to get pregnant  breast-feeding How should I use this medicine? This medicine is for  external use only. Do not take by mouth. Follow the directions on the prescription label. Wash your hands before and after use. Apply a thin film of medicine to the affected area. Do not cover with a bandage or dressing unless your doctor or health care professional tells you to. Do not use on healthy skin or over large areas of skin. Do not get this medicine in  your eyes. If you do, rinse out with plenty of cool tap water. It is important not to use more medicine than prescribed. Do not use your medicine more often than directed. Talk to your pediatrician regarding the use of this medicine in children. Special care may be needed. Elderly patients are more likely to have damaged skin through aging, and this may increase side effects. This medicine should only be used for brief periods and infrequently in older patients. Overdosage: If you think you have taken too much of this medicine contact a poison control center or emergency room at once. NOTE: This medicine is only for you. Do not share this medicine with others. What if I miss a dose? If you miss a dose, use it as soon as you can. If it is almost time for your next dose, use only that dose. Do not use double or extra doses. What may interact with this medicine? Interactions are not expected. This list may not describe all possible interactions. Give your health care provider a list of all the medicines, herbs, non-prescription drugs, or dietary supplements you use. Also tell them if you smoke, drink alcohol, or use illegal drugs. Some items may interact with your medicine. What should I watch for while using this medicine? Tell your doctor or health care professional if your symptoms do not start to get better within one week. Do not use for more than 14 days. Do not use on healthy skin or over large areas of skin. Tell your doctor or health care professional if you are exposed to anyone with measles or chickenpox, or if you develop sores or  blisters that do not heal properly. Do not use an airtight bandage to cover the affected area unless your doctor or health care professional tells you to. If you are to cover the area, follow the instructions carefully. Covering the area where the medicine is applied can increase the amount that passes through the skin and increases the risk of side effects. If treating the diaper area of a child, avoid covering the treated area with tight-fitting diapers or plastic pants. This may increase the amount of medicine that passes through the skin and increase the risk of serious side effects. What side effects may I notice from receiving this medicine? Side effects that you should report to your doctor or health care professional as soon as possible:  burning or itching of the skin  dark red spots on the skin  infection  painful, red, pus filled blisters in hair follicles  thinning of the skin, sunburn more likely especially on the face Side effects that usually do not require medical attention (report to your doctor or health care professional if they continue or are bothersome):  dry skin, irritation  unusual increased growth of hair on the face or body This list may not describe all possible side effects. Call your doctor for medical advice about side effects. You may report side effects to FDA at 1-800-FDA-1088. Where should I keep my medicine? Keep out of the reach of children. Store at room temperature between 15 and 30 degrees C (59 and 86 degrees F). Do not freeze. Throw away any unused medicine after the expiration date. NOTE: This sheet is a summary. It may not cover all possible information. If you have questions about this medicine, talk to your doctor, pharmacist, or health care provider.  2020 Elsevier/Gold Standard (2018-07-28 10:50:30)

## 2020-11-16 LAB — CBC WITH DIFFERENTIAL/PLATELET
Basophils Absolute: 0 10*3/uL (ref 0.0–0.2)
Basos: 1 %
EOS (ABSOLUTE): 0.1 10*3/uL (ref 0.0–0.4)
Eos: 3 %
Hematocrit: 39 % (ref 34.0–46.6)
Hemoglobin: 13.7 g/dL (ref 11.1–15.9)
Immature Grans (Abs): 0 10*3/uL (ref 0.0–0.1)
Immature Granulocytes: 0 %
Lymphocytes Absolute: 1.2 10*3/uL (ref 0.7–3.1)
Lymphs: 36 %
MCH: 33.4 pg — ABNORMAL HIGH (ref 26.6–33.0)
MCHC: 35.1 g/dL (ref 31.5–35.7)
MCV: 95 fL (ref 79–97)
Monocytes Absolute: 0.5 10*3/uL (ref 0.1–0.9)
Monocytes: 14 %
Neutrophils Absolute: 1.6 10*3/uL (ref 1.4–7.0)
Neutrophils: 46 %
Platelets: 189 10*3/uL (ref 150–450)
RBC: 4.1 x10E6/uL (ref 3.77–5.28)
RDW: 11.5 % — ABNORMAL LOW (ref 11.7–15.4)
WBC: 3.4 10*3/uL (ref 3.4–10.8)

## 2020-11-16 LAB — COMPREHENSIVE METABOLIC PANEL
ALT: 44 IU/L — ABNORMAL HIGH (ref 0–32)
AST: 36 IU/L (ref 0–40)
Albumin/Globulin Ratio: 1.7 (ref 1.2–2.2)
Albumin: 4.8 g/dL (ref 3.8–4.9)
Alkaline Phosphatase: 104 IU/L (ref 44–121)
BUN/Creatinine Ratio: 8 — ABNORMAL LOW (ref 9–23)
BUN: 8 mg/dL (ref 6–24)
Bilirubin Total: 0.6 mg/dL (ref 0.0–1.2)
CO2: 25 mmol/L (ref 20–29)
Calcium: 9.4 mg/dL (ref 8.7–10.2)
Chloride: 99 mmol/L (ref 96–106)
Creatinine, Ser: 0.97 mg/dL (ref 0.57–1.00)
GFR calc Af Amer: 76 mL/min/{1.73_m2} (ref >59)
GFR calc non Af Amer: 65 mL/min/{1.73_m2} (ref >59)
Globulin, Total: 2.9 g/dL (ref 1.5–4.5)
Glucose: 150 mg/dL — ABNORMAL HIGH (ref 65–99)
Potassium: 3.9 mmol/L (ref 3.5–5.2)
Sodium: 138 mmol/L (ref 134–144)
Total Protein: 7.7 g/dL (ref 6.0–8.5)

## 2020-11-16 LAB — LIPID PANEL
Chol/HDL Ratio: 3.9 ratio (ref 0.0–4.4)
Cholesterol, Total: 156 mg/dL (ref 100–199)
HDL: 40 mg/dL (ref >39)
LDL Chol Calc (NIH): 68 mg/dL (ref 0–99)
Triglycerides: 304 mg/dL — ABNORMAL HIGH (ref 0–149)
VLDL Cholesterol Cal: 48 mg/dL — ABNORMAL HIGH (ref 5–40)

## 2020-11-16 LAB — VITAMIN D 25 HYDROXY (VIT D DEFICIENCY, FRACTURES): Vit D, 25-Hydroxy: 17 ng/mL — ABNORMAL LOW (ref 30.0–100.0)

## 2020-11-16 LAB — VITAMIN B12: Vitamin B-12: 547 pg/mL (ref 232–1245)

## 2020-11-16 LAB — TSH: TSH: 0.918 u[IU]/mL (ref 0.450–4.500)

## 2020-11-21 ENCOUNTER — Encounter: Payer: Self-pay | Admitting: Family Medicine

## 2020-11-27 ENCOUNTER — Other Ambulatory Visit: Payer: Self-pay | Admitting: Family Medicine

## 2020-11-27 DIAGNOSIS — E559 Vitamin D deficiency, unspecified: Secondary | ICD-10-CM

## 2020-11-27 DIAGNOSIS — E785 Hyperlipidemia, unspecified: Secondary | ICD-10-CM

## 2020-12-02 ENCOUNTER — Telehealth: Payer: Self-pay | Admitting: Family Medicine

## 2020-12-02 ENCOUNTER — Ambulatory Visit: Payer: Medicare HMO | Admitting: Family Medicine

## 2020-12-02 ENCOUNTER — Other Ambulatory Visit: Payer: Self-pay | Admitting: Family Medicine

## 2020-12-02 DIAGNOSIS — E785 Hyperlipidemia, unspecified: Secondary | ICD-10-CM

## 2020-12-02 MED ORDER — GEMFIBROZIL 600 MG PO TABS
600.0000 mg | ORAL_TABLET | Freq: Two times a day (BID) | ORAL | 3 refills | Status: DC
Start: 2020-12-02 — End: 2021-03-24

## 2020-12-02 NOTE — Telephone Encounter (Signed)
We will discontinue Atorvastatin and initiate Gemfibrozil for elevated Triglycerides. Discussed in detail with patient today. New Rx sent to pharmacy today. We will re-assess Triglyceride levels in 3 months. Patient and husband verified understanding.

## 2020-12-03 ENCOUNTER — Telehealth: Payer: Self-pay

## 2020-12-03 ENCOUNTER — Telehealth: Payer: Self-pay | Admitting: Cardiovascular Disease

## 2020-12-03 NOTE — Telephone Encounter (Signed)
Returned call to patient. She states she went to [facility] and saw gummy bears on the ice cream bar, wanted to know if OK to eat. Advised she can eat gummy bears - will not impact her cardiac function or interact with meds. Advised to avoid if she has issues with blood sugar or dental issues

## 2020-12-03 NOTE — Telephone Encounter (Signed)
Patient would like to know if it is okay for her to eat gummy bears.

## 2020-12-03 NOTE — Telephone Encounter (Signed)
Patient called office today requesting number for cardiologist Dr. Allyson Sabal. Provided patient with contact information.

## 2020-12-12 ENCOUNTER — Telehealth: Payer: Self-pay | Admitting: Family Medicine

## 2020-12-12 NOTE — Telephone Encounter (Signed)
Pt is taking gabapentin TID, she was to know is can have something different , for her neck pain , because sleepy,dizziness . She want change the dosage. Please advise

## 2021-02-12 ENCOUNTER — Ambulatory Visit (INDEPENDENT_AMBULATORY_CARE_PROVIDER_SITE_OTHER): Payer: Medicare HMO | Admitting: Family Medicine

## 2021-02-12 ENCOUNTER — Encounter: Payer: Self-pay | Admitting: Family Medicine

## 2021-02-12 ENCOUNTER — Other Ambulatory Visit: Payer: Self-pay

## 2021-02-12 VITALS — BP 119/84 | HR 82 | Wt 160.0 lb

## 2021-02-12 DIAGNOSIS — M79605 Pain in left leg: Secondary | ICD-10-CM

## 2021-02-12 DIAGNOSIS — B20 Human immunodeficiency virus [HIV] disease: Secondary | ICD-10-CM

## 2021-02-12 DIAGNOSIS — G8929 Other chronic pain: Secondary | ICD-10-CM | POA: Diagnosis not present

## 2021-02-12 DIAGNOSIS — Z09 Encounter for follow-up examination after completed treatment for conditions other than malignant neoplasm: Secondary | ICD-10-CM

## 2021-02-12 DIAGNOSIS — G629 Polyneuropathy, unspecified: Secondary | ICD-10-CM

## 2021-02-12 DIAGNOSIS — I10 Essential (primary) hypertension: Secondary | ICD-10-CM | POA: Diagnosis not present

## 2021-02-12 DIAGNOSIS — M79604 Pain in right leg: Secondary | ICD-10-CM | POA: Diagnosis not present

## 2021-02-12 DIAGNOSIS — L309 Dermatitis, unspecified: Secondary | ICD-10-CM | POA: Diagnosis not present

## 2021-02-12 MED ORDER — GABAPENTIN 100 MG PO CAPS: 100.0000 mg | ORAL_CAPSULE | Freq: Three times a day (TID) | ORAL | 3 refills | Status: DC

## 2021-02-12 MED ORDER — GABAPENTIN 300 MG PO CAPS: 300.0000 mg | ORAL_CAPSULE | Freq: Three times a day (TID) | ORAL | 3 refills | Status: DC

## 2021-02-12 NOTE — Progress Notes (Signed)
[facility]   Established Patient Office Visit   Subjective:  Patient ID: Cathy Smith, female    DOB: 1966/03/31  Age: 55 y.o. MRN: [id number]  CC: No chief complaint on file.   HPI Cathy Smith is a 55 year old female who presents for Follow Up today.   Patient Active Problem List   Diagnosis Date Noted  . Claudication in peripheral vascular disease (HCC) 03/01/2020  . Prediabetes 12/03/2019  . Chronic pain of lower extremity 12/03/2019  . Neuropathy 08/17/2019  . Suspected deep vein thrombosis (DVT) 04/09/2019  . AIN grade III 01/18/2019  . Left leg pain 12/14/2018  . 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 LGSIL Pap smear of vagina 04/28/2017  . 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 History of fusion of cervical spine 08/27/2015  . 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 Physical exam, annual 09/06/2014  . Alopecia of scalp 09/06/2014  . Hypertriglyceridemia without hypercholesterolemia 08/08/2014  . Essential hypertension 06/12/2009  . MONILIASIS, ORAL 09/24/2008  . HX, PERSONAL, PAST NONCOMPLIANCE 11/24/2006  . Human immunodeficiency virus (HIV) disease (HCC) 09/15/2000  . HYSTERECTOMY, HX OF 11/10/1983   Current Status: Since her last office visit, she has c/o chronic bilateral leg pain. She uses Dr. Norva Karvonen beads in her bath water and take Gabapentin as prescribed.  She denies visual changes, chest pain, cough, shortness of breath, heart palpitations, and falls. She has occasional headaches and dizziness with position changes. Denies severe headaches, confusion, seizures, double vision, and blurred vision, nausea and vomiting. She continues to follow up with Infection Disease as needed. She denies fevers, chills, fatigue, recent infections, weight loss, and night sweats. Denies GI problems such as diarrhea, and constipation. She has no reports of blood in stools, dysuria and hematuria. No depression or  anxiety reported today. She is taking all medications as prescribed.    Past Medical History:  Diagnosis Date  . Coronary artery disease   . History of condyloma acuminatum   . History of uterine fibroid   . History of vaginal dysplasia    VAIN III--  oncologist-  dr Andrey Farmer  . History of vulvar dysplasia    VIN III   gyn-oncologist-  dr Andrey Farmer  . HIV infection (HCC) [facility]-  dr hatcher  . Hypertension   . Hypertriglyceridemia without hypercholesterolemia   . PONV (postoperative nausea and vomiting)   . VIN III (vulvar intraepithelial neoplasia III)   . Wears glasses     Past Surgical History:  Procedure Laterality Date  . ABDOMINAL HYSTERECTOMY  1995 approx  . ANTERIOR CERVICAL DECOMP/DISCECTOMY FUSION  11-30-2009   C4 -- C6  . CO2 LASER APPLICATION Bilateral 07/23/2015   Procedure: BILATERAL CO2 LASER ABLATION OF THE VULVA;  Surgeon: Adolphus Birchwood, MD;  Location: [facility];  Service: Gynecology;  Laterality: Bilateral;  . CO2 LASER APPLICATION N/A 02/25/2016   Procedure: CO2 LASER OF THE VULVAR;  Surgeon: Adolphus Birchwood, MD;  Location: [facility] [place];  Service: Gynecology;  Laterality: N/A;  . CO2 LASER APPLICATION N/A 05/04/2017   Procedure: CO2 LASER VAPORIZATION OF THE VULVA;  Surgeon: Adolphus Birchwood, MD;  Location: [facility];  Service: Gynecology;  Laterality: N/A;  . CO2 LASER APPLICATION N/A 12/27/2018   Procedure: CO2 LASER APPLICATION OF VULVA;  Surgeon: Adolphus Birchwood, MD;  Location: [facility] ;  Service: Gynecology;  Laterality: N/A;  .  COLONOSCOPY  01-28-2015  . CORONARY ARTERY BYPASS GRAFT N/A 11/25/2017   Procedure: CORONARY ARTERY BYPASS GRAFTING (CABG), ON PUMP, TIMES THREE, USING LEFT INTERNAL MAMMARY ARTERY AND ENDOSCOPICALLY HARVESTED LEFT GREATER SAPHENOUS VEIN;  Surgeon: Purcell Nailswen, Clarence H, MD;  [facility];  Service: Open Heart Surgery;  Laterality: N/A;  . I & D LEFT BUTTOCK ABSCESS   04-28-2001  . LEFT HEART CATH AND CORONARY ANGIOGRAPHY N/A 11/22/2017   Procedure: LEFT HEART CATH AND CORONARY ANGIOGRAPHY;  Surgeon: Runell GessBerry, Jonathan J, MD;  Location: [facility] INV[facility];  Service: Cardiovascular;  Laterality: N/A;  . TEE WITHOUT CARDIOVERSION N/A 11/25/2017   Procedure: TRANSESOPHAGEAL ECHOCARDIOGRAM (TEE);  Surgeon: Purcell Nailswen, Clarence H, MD;  [facility];  Service: Open Heart Surgery;  Laterality: N/A;  . VULVECTOMY Right 05/06/2015   Procedure: WIDE LOCAL  EXCISION  OF RIGHT VULVA;  Surgeon: Adolphus BirchwoodEmma Rossi, MD;  [facility] [place];  Service: Gynecology;  Laterality: Right;  Marland Kitchen. VULVECTOMY N/A 05/04/2017   Procedure: WIDE EXCISION VULVECTOMY;  Surgeon: Adolphus Birchwoodossi, Emma, MD;  [facility] [place];  Service: Gynecology;  Laterality: N/A;  . VULVECTOMY N/A 12/27/2018   Procedure: WIDE EXCISION VULVECTOMY;  Surgeon: Adolphus Birchwoodossi, Emma, MD;  [facility] [place];  Service: Gynecology;  Laterality: N/A;    Family History  Problem Relation Age of Onset  . Cataracts Mother   . Hypertension Father   . CAD Paternal Grandfather   . Colon cancer Neg Hx     Social History   Socioeconomic History  . Marital status: Married    Spouse name: Not on file  . Number of children: Not on file  . Years of education: Not on file  . Highest education level: Not on file  Occupational History  . Not on file  Tobacco Use  . Smoking status: Never Smoker  . Smokeless tobacco: Never Used  Vaping Use  . Vaping Use: Never used  Substance and Sexual Activity  . Alcohol use: No    Alcohol/week: 0.0 standard drinks  . Drug use: No  . Sexual activity: Yes    Partners: Male    Birth control/protection: Condom    Comment: pt. given condoms  Other Topics Concern  . Not on file  Social History Narrative  . Not on file   Social Determinants of Health   Financial Resource Strain: Not on file  Food Insecurity: Not on file  Transportation Needs: Not on file   Physical Activity: Not on file  Stress: Not on file  Social Connections: Not on file  Intimate Partner Violence: Not on file    Outpatient Medications Prior to Visit  Medication Sig Dispense Refill  . aspirin EC 81 MG tablet Take 81 mg by mouth daily. Swallow whole.    . darunavir (PREZISTA) 800 MG tablet Take 1 tablet (800 mg total) by mouth daily with breakfast. 30 tablet 11  . elvitegravir-cobicistat-emtricitabine-tenofovir (GENVOYA) 150-150-200-10 MG TABS tablet Take 1 tablet by mouth daily with breakfast. 30 tablet 11  . furosemide (LASIX) 40 MG tablet TAKE 1 TABLET(40 MG) BY MOUTH DAILY 90 tablet 3  . gabapentin (NEURONTIN) 300 MG capsule Take 1 capsule (300 mg total) by mouth 3 (three) times daily. 270 capsule 3  . gemfibrozil (LOPID) 600 MG tablet Take 1 tablet (600 mg total) by mouth 2 (two) times daily before a meal. 60 tablet 3  . hydrOXYzine (ATARAX/VISTARIL) 10 MG tablet Take 1 tablet (10 mg total) by mouth 3 (three) times daily  as needed. 90 tablet 1  . metoprolol tartrate (LOPRESSOR) 25 MG tablet TAKE 1 AND 1/2 TABLETS(37.5 MG) BY MOUTH TWICE DAILY 270 tablet 2  . triamcinolone (KENALOG) 0.1 % Apply 1 application topically 2 (two) times daily. 45 each 6   No facility-administered medications prior to visit.    No Known Allergies  ROS Review of Systems  Constitutional: Negative.   HENT: Negative.   Eyes: Negative.   Respiratory: Negative.   Cardiovascular: Negative.   Gastrointestinal: Negative.   Endocrine: Negative.   Genitourinary: Negative.   Musculoskeletal: Positive for arthralgias (generalized).       Bilateral leg pain  Skin: Negative.   Allergic/Immunologic: Negative.   Neurological: Positive for dizziness (occasional ) and headaches (occasional ).  Hematological: Negative.   Psychiatric/Behavioral: Negative.       Objective:    Physical Exam Vitals and nursing note reviewed.  Constitutional:      Appearance: Normal appearance.  HENT:      Head: Normocephalic and atraumatic.     Nose: Nose normal.     Mouth/Throat:     Mouth: Mucous membranes are moist.     Pharynx: Oropharynx is clear.  Cardiovascular:     Rate and Rhythm: Normal rate and regular rhythm.     Pulses: Normal pulses.     Heart sounds: Normal heart sounds.  Pulmonary:     Effort: Pulmonary effort is normal.     Breath sounds: Normal breath sounds.  Abdominal:     General: Bowel sounds are normal.     Palpations: Abdomen is soft.  Musculoskeletal:        General: Normal range of motion.     Cervical back: Normal range of motion and neck supple.  Skin:    General: Skin is warm and dry.     Findings: Rash (bilateral arms) present.  Neurological:     General: No focal deficit present.     Mental Status: She is alert and oriented to person, place, and time.  Psychiatric:        Mood and Affect: Mood normal.        Behavior: Behavior normal.        Thought Content: Thought content normal.        Judgment: Judgment normal.     BP 119/84   Pulse 82   Wt 160 lb (72.6 kg)   SpO2 100%   BMI 29.26 kg/m  Wt Readings from Last 3 Encounters:  02/12/21 160 lb (72.6 kg)  11/15/20 163 lb (73.9 kg)  10/24/20 157 lb (71.2 kg)     Health Maintenance Due  Topic Date Due  . PAP SMEAR-Modifier  04/03/2021    There are no preventive care reminders to display for this patient.  Lab Results  Component Value Date   TSH 0.918 11/15/2020   Lab Results  Component Value Date   WBC 3.4 11/15/2020   HGB 13.7 11/15/2020   HCT 39.0 11/15/2020   MCV 95 11/15/2020   PLT 189 11/15/2020   Lab Results  Component Value Date   NA 138 11/15/2020   K 3.9 11/15/2020   CO2 25 11/15/2020   GLUCOSE 150 (H) 11/15/2020   BUN 8 11/15/2020   CREATININE 0.97 11/15/2020   BILITOT 0.6 11/15/2020   ALKPHOS 104 11/15/2020   AST 36 11/15/2020   ALT 44 (H) 11/15/2020   PROT 7.7 11/15/2020   ALBUMIN 4.8 11/15/2020   CALCIUM 9.4 11/15/2020   ANIONGAP 8 12/27/2018  Lab  Results  Component Value Date   CHOL 156 11/15/2020   Lab Results  Component Value Date   HDL 40 11/15/2020   Lab Results  Component Value Date   LDLCALC 68 11/15/2020   Lab Results  Component Value Date   TRIG 304 (H) 11/15/2020  / Lab Results  Component Value Date   CHOLHDL 3.9 11/15/2020   Lab Results  Component Value Date   HGBA1C 6.3 (A) 12/01/2019      Assessment & Plan:   1. Essential hypertension The current medical regimen is effective; blood pressure is stable at 119/84 today; continue present plan and medications as prescribed. She will continue to take medications as prescribed, to decrease high sodium intake, excessive alcohol intake, increase potassium intake, smoking cessation, and increase physical activity of at least 30 minutes of cardio activity daily. She will continue to follow Heart Healthy or DASH diet.  2. Neuropathy  3. Human immunodeficiency virus (HIV) disease (HCC) Continue HIV medications and follow ups with Infection Disease.  4. Eczema, unspecified type Stable.  5. Follow up She will follow up in 11/2021 for her Annual Physical and Labwork.   No orders of the defined types were placed in this encounter.   No orders of the defined types were placed in this encounter.   Referral Orders  No referral(s) requested today    Raliegh Ip, MSN, ANE, FNP-BC [facility] [address] [place], [place] [address] [phone] fax   Problem List Items Addressed This Visit      Cardiovascular and Mediastinum   Essential hypertension - Primary     Nervous and Auditory   Neuropathy     Other   Human immunodeficiency virus (HIV) disease (HCC) (Chronic)    Other Visit Diagnoses    Eczema, unspecified type       Follow up          No orders of the defined types were placed in this encounter.   Follow-up: No follow-ups on file.     Kallie Locks, FNP

## 2021-02-18 ENCOUNTER — Encounter: Payer: Self-pay | Admitting: Family Medicine

## 2021-02-28 ENCOUNTER — Ambulatory Visit: Payer: Medicare HMO | Admitting: Cardiovascular Disease

## 2021-03-03 ENCOUNTER — Ambulatory Visit: Payer: Medicare HMO | Admitting: Family Medicine

## 2021-03-11 DIAGNOSIS — Z0100 Encounter for examination of eyes and vision without abnormal findings: Secondary | ICD-10-CM | POA: Diagnosis not present

## 2021-03-24 ENCOUNTER — Other Ambulatory Visit: Payer: Self-pay

## 2021-03-24 DIAGNOSIS — I10 Essential (primary) hypertension: Secondary | ICD-10-CM

## 2021-03-24 DIAGNOSIS — E785 Hyperlipidemia, unspecified: Secondary | ICD-10-CM

## 2021-03-24 MED ORDER — FUROSEMIDE 40 MG PO TABS: ORAL_TABLET | ORAL | 3 refills | Status: DC

## 2021-03-24 MED ORDER — GEMFIBROZIL 600 MG PO TABS: 600.0000 mg | ORAL_TABLET | Freq: Two times a day (BID) | ORAL | 3 refills | Status: DC

## 2021-04-09 ENCOUNTER — Other Ambulatory Visit: Payer: Self-pay

## 2021-04-09 ENCOUNTER — Encounter: Payer: Self-pay | Admitting: Cardiovascular Disease

## 2021-04-09 ENCOUNTER — Ambulatory Visit (INDEPENDENT_AMBULATORY_CARE_PROVIDER_SITE_OTHER): Payer: Medicare HMO | Admitting: Cardiovascular Disease

## 2021-04-09 DIAGNOSIS — Z951 Presence of aortocoronary bypass graft: Secondary | ICD-10-CM | POA: Diagnosis not present

## 2021-04-09 DIAGNOSIS — E781 Pure hyperglyceridemia: Secondary | ICD-10-CM

## 2021-04-09 DIAGNOSIS — I10 Essential (primary) hypertension: Secondary | ICD-10-CM

## 2021-04-09 NOTE — Progress Notes (Signed)
04/09/2021 SUSSAN METER   1966/08/16  [id number]  Primary Physician Pcp, No Primary Cardiologist: Runell Gess MD [place]oles [place], [place]  HPI:  Cathy Smith is a 55 y.o.  moderately overweight married African-American female with no children.  She does not work. She was referred to me byKimberly Harris FNPfor new onset chest pain.I last saw her for a virtual telemedicine video visit 03/01/2020.  She really has no cardiac risk factors other than hypertension hyperlipidemia both of which are treated. There is no family history. She does not smoke. She had onset of chest pain 3-4 months ago,, currently trying to 3 times a week lasting up to 5-10 minutes at a time associated with shortness of breath.she had a Myoview stress test that showed severe ischemia in the LAD territory. She presentsunderwent cardiac catheterization by myself 11/22/17 revealing left main/three-vessel disease with preserved LV function. She underwent coronary artery bypass graft 3 11/25/17 by Dr. Ashley Mariner with a LIMA to LAD, vein to obtuse marginal branch and Vein to the posterior lateral branch of the RCA. Her postoperative course was uncomplicated.  Since I saw her a year ago she continues to do well.  She denies chest pain or shortness of breath.  She was complaining of some lower extremity discomfort however lower extremity arterial Doppler studies performed 03/15/2020 revealed normal ABIs bilaterally with triphasic waveforms.    Current Meds  Medication Sig  . aspirin EC 81 MG tablet Take 81 mg by mouth daily. Swallow whole.  . darunavir (PREZISTA) 800 MG tablet Take 1 tablet (800 mg total) by mouth daily with breakfast.  . elvitegravir-cobicistat-emtricitabine-tenofovir (GENVOYA) 150-150-200-10 MG TABS tablet Take 1 tablet by mouth daily with breakfast.  . furosemide (LASIX) 40 MG tablet Take one tablet by mouth daily  . gabapentin (NEURONTIN) 100 MG capsule Take 1 capsule (100 mg total) by mouth 3  (three) times daily. (Take with Gabapentin 300 mg = 400 mg total dose TID)  . gabapentin (NEURONTIN) 300 MG capsule Take 1 capsule (300 mg total) by mouth 3 (three) times daily. (Take with Gabapentin 100 mg = 400 mg total dose TID)  . gemfibrozil (LOPID) 600 MG tablet Take 1 tablet (600 mg total) by mouth 2 (two) times daily before a meal.  . hydrOXYzine (ATARAX/VISTARIL) 10 MG tablet Take 1 tablet (10 mg total) by mouth 3 (three) times daily as needed.  . metoprolol tartrate (LOPRESSOR) 25 MG tablet TAKE 1 AND 1/2 TABLETS(37.5 MG) BY MOUTH TWICE DAILY  . triamcinolone (KENALOG) 0.1 % Apply 1 application topically 2 (two) times daily.     No Known Allergies  Social History   Socioeconomic History  . Marital status: Married    Spouse name: Not on file  . Number of children: Not on file  . Years of education: Not on file  . Highest education level: Not on file  Occupational History  . Not on file  Tobacco Use  . Smoking status: Never Smoker  . Smokeless tobacco: Never Used  Vaping Use  . Vaping Use: Never used  Substance and Sexual Activity  . Alcohol use: No    Alcohol/week: 0.0 standard drinks  . Drug use: No  . Sexual activity: Yes    Partners: Male    Birth control/protection: Condom    Comment: pt. given condoms  Other Topics Concern  . Not on file  Social History Narrative  . Not on file   Social Determinants of Health   Financial  Resource Strain: Not on file  Food Insecurity: Not on file  Transportation Needs: Not on file  Physical Activity: Not on file  Stress: Not on file  Social Connections: Not on file  Intimate Partner Violence: Not on file     Review of Systems: General: negative for chills, fever, night sweats or weight changes.  Cardiovascular: negative for chest pain, dyspnea on exertion, edema, orthopnea, palpitations, paroxysmal nocturnal dyspnea or shortness of breath Dermatological: negative for rash Respiratory: negative for cough or  wheezing Urologic: negative for hematuria Abdominal: negative for nausea, vomiting, diarrhea, bright red blood per rectum, melena, or hematemesis Neurologic: negative for visual changes, syncope, or dizziness All other systems reviewed and are otherwise negative except as noted above.    Blood pressure 110/72, pulse 74, height 5\' 2"  (1.575 m), weight 157 lb (71.2 kg).  General appearance: alert and no distress Neck: no adenopathy, no carotid bruit, no JVD, supple, symmetrical, trachea midline and thyroid not enlarged, symmetric, no tenderness/mass/nodules Lungs: clear to auscultation bilaterally Heart: regular rate and rhythm, S1, S2 normal, no murmur, click, rub or gallop Extremities: extremities normal, atraumatic, no cyanosis or edema Pulses: 2+ and symmetric Skin: Skin color, texture, turgor normal. No rashes or lesions Neurologic: Alert and oriented X 3, normal strength and tone. Normal symmetric reflexes. Normal coordination and gait  EKG sinus rhythm at 74 with incomplete right bundle branch block and left axis deviation.  I personally reviewed this EKG.  ASSESSMENT AND PLAN:   Essential hypertension History of essential hypertension blood pressure measured today at 110/72.  She is on metoprolol.  S/P CABG x 3 History of CAD status post cardiac catheterization by myself 11/22/2017 after a Myoview stress test showed ischemia in the LAD territory.  She had left main/three-vessel disease underwent CABG x3 by Dr. 11/24/2017 11/25/2017 with LIMA to the LAD, vein to an obtuse marginal branch, and posterior lateral branch of the RCA.  Her postop course was uncomplicated.  She denies chest pain or shortness of breath.  Hypertriglyceridemia without hypercholesterolemia History of hyperlipidemia on gemfibrozil with lipid profile performed 11/15/2020 revealing total cholesterol of 156, LDL 68 and HDL 40.      01/13/2021 MD FACP,FACC,FAHA, [facility] 04/09/2021 11:48 AM

## 2021-04-09 NOTE — Assessment & Plan Note (Signed)
History of essential hypertension blood pressure measured today at 110/72.  She is on metoprolol.

## 2021-04-09 NOTE — Assessment & Plan Note (Signed)
History of hyperlipidemia on gemfibrozil with lipid profile performed 11/15/2020 revealing total cholesterol of 156, LDL 68 and HDL 40.

## 2021-04-09 NOTE — Patient Instructions (Signed)

## 2021-04-09 NOTE — Assessment & Plan Note (Signed)
History of CAD status post cardiac catheterization by myself 11/22/2017 after a Myoview stress test showed ischemia in the LAD territory.  She had left main/three-vessel disease underwent CABG x3 by Dr. Cornelius Moras 11/25/2017 with LIMA to the LAD, vein to an obtuse marginal branch, and posterior lateral branch of the RCA.  Her postop course was uncomplicated.  She denies chest pain or shortness of breath.

## 2021-04-22 ENCOUNTER — Other Ambulatory Visit: Payer: Self-pay | Admitting: Infectious Diseases

## 2021-04-22 DIAGNOSIS — B20 Human immunodeficiency virus [HIV] disease: Secondary | ICD-10-CM

## 2021-04-28 ENCOUNTER — Other Ambulatory Visit: Payer: Self-pay

## 2021-04-28 MED ORDER — METOPROLOL TARTRATE 25 MG PO TABS: ORAL_TABLET | ORAL | 3 refills | Status: DC

## 2021-05-14 ENCOUNTER — Other Ambulatory Visit: Payer: Self-pay | Admitting: Nurse Practitioner

## 2021-05-14 DIAGNOSIS — Z1231 Encounter for screening mammogram for malignant neoplasm of breast: Secondary | ICD-10-CM

## 2021-05-16 ENCOUNTER — Ambulatory Visit: Payer: Medicare HMO | Admitting: Family Medicine

## 2021-07-28 ENCOUNTER — Telehealth: Payer: Self-pay | Admitting: *Deleted

## 2021-07-28 NOTE — Telephone Encounter (Signed)
Called and moved the patient's appt from 10/5 to 10/12 

## 2021-08-04 ENCOUNTER — Encounter: Payer: Self-pay | Admitting: Infectious Diseases

## 2021-08-04 ENCOUNTER — Ambulatory Visit: Payer: Medicare HMO

## 2021-08-04 ENCOUNTER — Other Ambulatory Visit: Payer: Self-pay

## 2021-08-04 ENCOUNTER — Ambulatory Visit (INDEPENDENT_AMBULATORY_CARE_PROVIDER_SITE_OTHER): Payer: Medicare HMO | Admitting: Infectious Diseases

## 2021-08-04 VITALS — BP 117/76 | HR 79 | Temp 98.3°F | Wt 155.8 lb

## 2021-08-04 DIAGNOSIS — G629 Polyneuropathy, unspecified: Secondary | ICD-10-CM

## 2021-08-04 DIAGNOSIS — Z23 Encounter for immunization: Secondary | ICD-10-CM

## 2021-08-04 DIAGNOSIS — R7303 Prediabetes: Secondary | ICD-10-CM | POA: Diagnosis not present

## 2021-08-04 DIAGNOSIS — D071 Carcinoma in situ of vulva: Secondary | ICD-10-CM

## 2021-08-04 DIAGNOSIS — B20 Human immunodeficiency virus [HIV] disease: Secondary | ICD-10-CM | POA: Diagnosis not present

## 2021-08-04 DIAGNOSIS — R739 Hyperglycemia, unspecified: Secondary | ICD-10-CM | POA: Diagnosis not present

## 2021-08-04 NOTE — Assessment & Plan Note (Signed)
FU with GYN ONC in October.

## 2021-08-04 NOTE — Progress Notes (Signed)
Name: Cathy Smith  DOB: 02/01/1966 MRN: [id number] PCP: Vevelyn Francois, NP    Brief Narrative:  Cathy Smith  is a 55 y.o. female with well controlled HIV disease, Dx 2005 per chart review. CD4 nadir 60 VL 12,300  HIV Risk: HIV+ husband, heterosexual History of OIs: vulvar cancer  Intake Labs 2021: Hep B sAg (-), sAb (-), cAb (-); Hep A (not done), Hep C (not done) Quantiferon () HLA B*5701 (-) G6PD: ()   Previous Regimens: Genvoya + Prezista   Genotypes: 2008 - P14S, V35I, S68G, R83K, V106I,  I178M, G196E, [id number], T200I, L214F, E297K, D324E;  V3I, T4S, S37N, L63P  2009 - M184V  2010 - V106I   Cumulative Genotype:  RTI Resistance Mutations: M184V NNRTI Resistance Mutations: V106I  Nucleoside Reverse Transcriptase Inhibitors abacavir (ABC) Low-Level Resistance zidovudine (AZT) Susceptible emtricitabine (FTC) High-Level Resistance lamivudine (3TC) High-Level Resistance tenofovir (TDF) Susceptible  Non-nucleoside Reverse Transcriptase Inhibitors doravirine (DOR) Potential Low-Level Resistance efavirenz (EFV) Susceptible etravirine (ETR) Potential Low-Level Resistance nevirapine (NVP) Potential Low-Level Resistance rilpivirine (RPV) Potential Low-Level Resistance  PI Major Resistance Mutations: None PI Accessory Resistance Mutations: None  Protease Inhibitors atazanavir/r (ATV/r) Susceptible darunavir/r (DRV/r) Susceptible lopinavir/r (LPV/r) Susceptible   Subjective:   Chief Complaint  Patient presents with   Follow-up    B20        HPI: [name] is here for routine follow up care for HIV. She has had long-standing perfectly controlled HIV on Genvoya + Prezista with last VL < 20 and CD4 411. She has not missed any doses of her medication and has no problems with it. Wants to meet with financial today to touch base with any possible needs to prevent medication lapse.  Requesting diabetes screen today to make sure her blood sugars are not too high.    Continues on gabapentin for neuropathy treatment - makes her very drowsy. Has had some near falls.   Received 4th COVID booster - cannot recall when that was exactly but > 4 months ago she thinks   She has a history of vulvovaginal dysplasia (VIN III). Vaginal pap smear done 03/2020 - missed GYN appt and rescheduled to 08/20/21.   FU with cardiology s/p CABG x 3 in 11/2017 - Follows with Dr. Gwenlyn Found and doing well. BP under good control and FU in 102yr Lipid panel January 2022 with LDL 68, total 156, TG 304.    Review of Systems  Constitutional:  Negative for chills and fever.  HENT:  Negative for tinnitus.   Eyes:  Negative for blurred vision and photophobia.  Respiratory:  Negative for cough and sputum production.   Cardiovascular:  Negative for chest pain.  Gastrointestinal:  Negative for diarrhea, nausea and vomiting.  Genitourinary:  Negative for dysuria.  Skin:  Negative for rash.  Neurological:  Negative for headaches.     Past Medical History:  Diagnosis Date   Coronary artery disease    History of condyloma acuminatum    History of uterine fibroid    History of vaginal dysplasia    VAIN III--  oncologist-  dr rDenman George  History of vulvar dysplasia    VIN III   gyn-oncologist-  dr rDenman George  HIV [facility]  dr hatcher   Hypertension    Hypertriglyceridemia without hypercholesterolemia    PONV (postoperative nausea and vomiting)    VIN III (vulvar intraepithelial neoplasia III)    Wears glasses     Outpatient Medications Prior to  Visit  Medication Sig Dispense Refill   aspirin EC 81 MG tablet Take 81 mg by mouth daily. Swallow whole.     darunavir (PREZISTA) 800 MG tablet Take 1 tablet (800 mg total) by mouth daily with breakfast. 30 tablet 11   elvitegravir-cobicistat-emtricitabine-tenofovir (GENVOYA) 150-150-200-10 MG TABS tablet Take 1 tablet by mouth daily with breakfast. 30 tablet 11   furosemide (LASIX) 40 MG tablet Take  one tablet by mouth daily 90 tablet 3   gabapentin (NEURONTIN) 100 MG capsule Take 1 capsule (100 mg total) by mouth 3 (three) times daily. (Take with Gabapentin 300 mg = 400 mg total dose TID) 270 capsule 3   gabapentin (NEURONTIN) 300 MG capsule Take 1 capsule (300 mg total) by mouth 3 (three) times daily. (Take with Gabapentin 100 mg = 400 mg total dose TID) 270 capsule 3   gemfibrozil (LOPID) 600 MG tablet Take 1 tablet (600 mg total) by mouth 2 (two) times daily before a meal. 60 tablet 3   hydrOXYzine (ATARAX/VISTARIL) 10 MG tablet Take 1 tablet (10 mg total) by mouth 3 (three) times daily as needed. 90 tablet 1   metoprolol tartrate (LOPRESSOR) 25 MG tablet TAKE 1 AND 1/2 TABLETS(37.5 MG) BY MOUTH TWICE DAILY 270 tablet 3   triamcinolone (KENALOG) 0.1 % Apply 1 application topically 2 (two) times daily. 45 each 6   No facility-administered medications prior to visit.     No Known Allergies  Social History   Tobacco Use   Smoking status: Never   Smokeless tobacco: Never  Vaping Use   Vaping Use: Never used  Substance Use Topics   Alcohol use: No    Alcohol/week: 0.0 standard drinks   Drug use: No    Family History  Problem Relation Age of Onset   Cataracts Mother    Hypertension Father    CAD Paternal Grandfather    Colon cancer Neg Hx     Social History   Substance and Sexual Activity  Sexual Activity Yes   Partners: Male   Birth control/protection: Condom   Comment: pt. given condoms     Objective:   Vitals:   08/04/21 0919  BP: 117/76  Pulse: 79  Temp: 98.3 F (36.8 C)  TempSrc: Oral  SpO2: 97%  Weight: 155 lb 12.8 oz (70.7 kg)   Body mass index is 28.5 kg/m.   Physical Exam Constitutional:      Appearance: Normal appearance. She is not ill-appearing.  HENT:     Mouth/Throat:     Mouth: Mucous membranes are moist.     Pharynx: Oropharynx is clear.  Eyes:     General: No scleral icterus. Pulmonary:     Effort: Pulmonary effort is normal.   Neurological:     Mental Status: She is oriented to person, place, and time.  Psychiatric:        Mood and Affect: Mood normal.        Thought Content: Thought content normal.    Lab Results Lab Results  Component Value Date   WBC 3.4 11/15/2020   HGB 13.7 11/15/2020   HCT 39.0 11/15/2020   MCV 95 11/15/2020   PLT 189 11/15/2020    Lab Results  Component Value Date   CREATININE 0.97 11/15/2020   BUN 8 11/15/2020   NA 138 11/15/2020   K 3.9 11/15/2020   CL 99 11/15/2020   CO2 25 11/15/2020    Lab Results  Component Value Date   ALT 44 (H) 11/15/2020  AST 36 11/15/2020   ALKPHOS 104 11/15/2020   BILITOT 0.6 11/15/2020    Lab Results  Component Value Date   CHOL 156 11/15/2020   HDL 40 11/15/2020   LDLCALC 68 11/15/2020   TRIG 304 (H) 11/15/2020   CHOLHDL 3.9 11/15/2020   HIV 1 RNA Quant  Date Value  08/12/2020 <20 Copies/mL  02/19/2020 <20 NOT DETECTED copies/mL  08/01/2019 <20 NOT DETECTED copies/mL   CD4 T Cell Abs (/uL)  Date Value  08/12/2020 448  02/19/2020 411  05/09/2018 300 (L)     Assessment & Plan:   Problem List Items Addressed This Visit       Unprioritized   Human immunodeficiency virus (HIV) disease ([place]) - Primary (Chronic)    She is doing well on Genvoya + Prezista taken together everyday. VL and CD4 to be drawn and updated today.  LFTs for monitoring while on integrase.  Vaginal PAP to be done in October at GYN follow up  Flu vaccine and COVID bivalent booster given today.  Information provided re: monkeypox - she will call to schedule if she desires this as well (4 weeks after covid booster).  RTC in 6-9 months.       Relevant Orders   COMPLETE METABOLIC PANEL WITH GFR   CBC   HIV 1 RNA quant-no reflex-bld   T-helper cells (CD4) count   VIN III (vulvar intraepithelial neoplasia III)    FU with GYN ONC in October.       Prediabetes    Re-screen today with history of CABG and elevated blood sugars in the past. Has follow  up with New PCP soon.       Relevant Orders   Hemoglobin A1c   Neuropathy    Recommended trying capsaicin cream to the thigh/leg where she has neuropathy to see if it helps in between gabapentin dosing. Counseled to wash hands immediately after application or to use gloves to administer.        Janene Madeira, MSN, NP-C [facility] for Infectious [place] Pager: [phone] Office: [phone]  08/04/21  10:16 AM

## 2021-08-04 NOTE — Progress Notes (Signed)
   Covid-19 Vaccination Clinic  Name:  TARAHJI RAMTHUN    MRN: [id number] DOB: 1966-08-18  08/04/2021  Ms. Debruler was observed post Covid-19 immunization for 15 minutes without incident. She was provided with Vaccine Information Sheet and instruction to access the V-Safe system.   Ms. Reyburn was instructed to call 911 with any severe reactions post vaccine: Difficulty breathing  Swelling of face and throat  A fast heartbeat  A bad rash all over body  Dizziness and weakness       Cass Edinger P Lynnsie Linders, CMA

## 2021-08-04 NOTE — Assessment & Plan Note (Signed)
Recommended trying capsaicin cream to the thigh/leg where she has neuropathy to see if it helps in between gabapentin dosing. Counseled to wash hands immediately after application or to use gloves to administer.

## 2021-08-04 NOTE — Assessment & Plan Note (Signed)
Re-screen today with history of CABG and elevated blood sugars in the past. Has follow up with New PCP soon.

## 2021-08-04 NOTE — Assessment & Plan Note (Addendum)
She is doing well on Genvoya + Prezista taken together everyday. VL and CD4 to be drawn and updated today.  LFTs for monitoring while on integrase.  Vaginal PAP to be done in October at GYN follow up  Flu vaccine and COVID bivalent booster given today.  Information provided re: monkeypox - she will call to schedule if she desires this as well (4 weeks after covid booster).  RTC in 6-9 months.

## 2021-08-04 NOTE — Patient Instructions (Addendum)
Nice to see you!  Please continue your Genvoya and Prezista once daily.   Consider trying Capsaicin Cream for your neuropathy - just wear gloves to put it on or wash your hands right away.   We gave you your flu shot and COVID bivalent booster today.   Monkeypox vaccine is call the [employer] vaccine - we can get this for you if you like.    Next appointment with me in 6-9 months (any time between March and May)

## 2021-08-05 ENCOUNTER — Telehealth: Payer: Self-pay | Admitting: Nurse Practitioner

## 2021-08-05 LAB — T-HELPER CELLS (CD4) COUNT (NOT AT ARMC)
CD4 % Helper T Cell: 25 % — ABNORMAL LOW (ref 33–65)
CD4 T Cell Abs: 378 /uL — ABNORMAL LOW (ref 400–1790)

## 2021-08-05 NOTE — Telephone Encounter (Signed)
N/A unable to leave a message for patient to call back and schedule Medicare Annual Wellness Visit (AWV) to be done virtually or by telephone.  Please schedule at anytime with -Nurse Health Advisor.      Any questions, please call me at [phone]

## 2021-08-06 ENCOUNTER — Encounter: Payer: Self-pay | Admitting: Infectious Diseases

## 2021-08-06 LAB — COMPLETE METABOLIC PANEL WITH GFR
AG Ratio: 1.4 (calc) (ref 1.0–2.5)
ALT: 20 U/L (ref 6–29)
AST: 19 U/L (ref 10–35)
Albumin: 4.7 g/dL (ref 3.6–5.1)
Alkaline phosphatase (APISO): 68 U/L (ref 37–153)
BUN/Creatinine Ratio: 15 (calc) (ref 6–22)
BUN: 17 mg/dL (ref 7–25)
CO2: 28 mmol/L (ref 20–32)
Calcium: 9.6 mg/dL (ref 8.6–10.4)
Chloride: 102 mmol/L (ref 98–110)
Creat: 1.1 mg/dL — ABNORMAL HIGH (ref 0.50–1.03)
Globulin: 3.4 g/dL (calc) (ref 1.9–3.7)
Glucose, Bld: 130 mg/dL — ABNORMAL HIGH (ref 65–99)
Potassium: 3.8 mmol/L (ref 3.5–5.3)
Sodium: 141 mmol/L (ref 135–146)
Total Bilirubin: 0.4 mg/dL (ref 0.2–1.2)
Total Protein: 8.1 g/dL (ref 6.1–8.1)
eGFR: 59 mL/min/{1.73_m2} — ABNORMAL LOW (ref >=60)

## 2021-08-06 LAB — CBC
HCT: 37.8 % (ref 35.0–45.0)
Hemoglobin: 12.6 g/dL (ref 11.7–15.5)
MCH: 32.5 pg (ref 27.0–33.0)
MCHC: 33.3 g/dL (ref 32.0–36.0)
MCV: 97.4 fL (ref 80.0–100.0)
MPV: 11.3 fL (ref 7.5–12.5)
Platelets: 212 10*3/uL (ref 140–400)
RBC: 3.88 10*6/uL (ref 3.80–5.10)
RDW: 11.7 % (ref 11.0–15.0)
WBC: 3.7 10*3/uL — ABNORMAL LOW (ref 3.8–10.8)

## 2021-08-06 LAB — HEMOGLOBIN A1C
Hgb A1c MFr Bld: 6.8 % of total Hgb — ABNORMAL HIGH (ref <5.7)
Mean Plasma Glucose: 148 mg/dL
eAG (mmol/L): 8.2 mmol/L

## 2021-08-06 LAB — HIV-1 RNA QUANT-NO REFLEX-BLD
HIV 1 RNA Quant: NOT DETECTED Copies/mL
HIV-1 RNA Quant, Log: NOT DETECTED Log cps/mL

## 2021-08-13 ENCOUNTER — Ambulatory Visit: Payer: Medicare HMO | Admitting: Obstetrics & Gynecology

## 2021-08-14 ENCOUNTER — Telehealth: Payer: Self-pay | Admitting: *Deleted

## 2021-08-14 ENCOUNTER — Telehealth: Payer: Self-pay

## 2021-08-14 NOTE — Telephone Encounter (Signed)
Patient called saying she did not get much sleep last night because her "ear was bugging" her. She says it doesn't ache, but it feels "clogged up," and that she uses something to clean her ears.   Recommended that she does not put anything in her ears. She is asking for something to take over the counter for this. Relayed that Judeth Cornfield is not in the office this week and that her ear probably needs to be examined. She has a PCP with internal medicine, recommended she call them to schedule an appointment for evaluation. Patient verbalized understanding and has no further questions.   Sandie Ano, RN

## 2021-08-14 NOTE — Telephone Encounter (Signed)
Moved the patient's appt from 10:30 am on 10/12 to the afternoon

## 2021-08-19 NOTE — Progress Notes (Unsigned)
Follow Up Note: Gyn-Onc  Cathy Smith 55 y.o. female  CC: She presents for a f/u exam  HPI:  55 year old African American female seen in consultation requested of [name], and [name] regarding management of a newly diagnosed severe dysplasia of the upper vagina. Briefly, the patient's history includes having a past history of abdominal hysterectomy for what we understand and have been fibroids. She denies any problems with Pap smears prior to the hysterectomy. More recently she has had some abnormal Pap smears ultimately resulting in colposcopy and directed biopsy on 04/07/2012. 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   The patient did well until March 2015 with a Pap smear showing high-grade dysplasia once again. In April and May 2015 the patient received 2 additional months of Efudex. From June 2015 through February 2016 her Pap smears only showed low-grade SIL.   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 She was scheduled for a wide local excision of the right anterior vulva which was performed on 05/06/15 and revealed VIN3. Margins were positive. Intraoperative evaluation showed lesions consistent with VIN3 on bilateral posterior labia. These were biopsied (but not treated at the patient's request). These biopsies also returned as positive for VIN3.   On 07/23/15 she was taken to the OR for an extensive CO2 laser ablation of the circumferential labia minora and majora to ablate all acetowhite areas. No pathology was taken. The clitoris was spared.    Her VIN 3 recurred and on 02/25/16 she returned to the OR for CO2 laser which included the anterior vulva and clitoris.   Pap in March, 2018 showed LGSIL Pap in March 2019  showed LGSIL , high risk HPV present.    Her last Pap smear in March 2018 showed LGSIL + hrHPV.    She had open heart surgery in 2019. Since that time she has exhibited increased confusion and difficulty following instructions.    She underwent WLE right vulva and CO2 laser of anterior vulva on 05/04/17. She has no complaints. Biopsies on February, 2020 of the anterior and posterior vulva showed VIN 3.   She was taken to the OR on 12/27/18 for a wide local excision of the anterior and posterior vulva and CO2 laser of the periclitoral tissues. 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 All lesions showed VIN 3/AIN 3.   Pap 12/13/18 was stable with LGSIL and positive high risk HPV present.  Pap 5/21 showed ASCUS and positive hight risk HPV present.  Interval History: She denies any new lesions or itching.    Review of Systems  Review of Systems  Genitourinary:        Negative for itching    Current Meds:  Outpatient Encounter Medications as of 08/20/2021  Medication Sig   aspirin EC 81 MG tablet Take 81 mg by mouth daily. Swallow whole.   darunavir (PREZISTA) 800 MG tablet Take 1 tablet (800 mg total) by mouth daily with breakfast.   elvitegravir-cobicistat-emtricitabine-tenofovir (GENVOYA) 150-150-200-10 MG TABS tablet Take 1 tablet by mouth daily with breakfast.   furosemide (LASIX) 40 MG tablet Take one tablet by mouth daily   gabapentin (NEURONTIN) 100 MG capsule Take 1 capsule (100 mg total) by mouth 3 (three) times daily. (Take with Gabapentin 300 mg = 400 mg total  dose TID)   gabapentin (NEURONTIN) 300 MG capsule Take 1 capsule (300 mg total) by mouth 3 (three) times daily. (Take with Gabapentin 100 mg = 400 mg total dose TID)   gemfibrozil (LOPID) 600 MG tablet Take 1 tablet (600 mg total) by mouth 2 (two) times daily before a meal.   hydrOXYzine (ATARAX/VISTARIL) 10 MG tablet Take 1 tablet (10 mg total) by mouth 3 (three) times daily  as needed.   metoprolol tartrate (LOPRESSOR) 25 MG tablet TAKE 1 AND 1/2 TABLETS(37.5 MG) BY MOUTH TWICE DAILY   triamcinolone (KENALOG) 0.1 % Apply 1 application topically 2 (two) times daily.   [DISCONTINUED] atorvastatin (LIPITOR) 20 MG tablet TAKE 1 TABLET(20 MG) BY MOUTH DAILY AT 6 PM   No facility-administered encounter medications on file as of 08/20/2021.    Allergy: No Known Allergies  Social Hx:   Social History   Socioeconomic History   Marital status: Married    Spouse name: Not on file   Number of children: Not on file   Years of education: Not on file   Highest education level: Not on file  Occupational History   Not on file  Tobacco Use   Smoking status: Never   Smokeless tobacco: Never  Vaping Use   Vaping Use: Never used  Substance and Sexual Activity   Alcohol use: No    Alcohol/week: 0.0 standard drinks   Drug use: No   Sexual activity: Yes    Partners: Male    Birth control/protection: Condom    Comment: pt. given condoms  Other Topics Concern   Not on file  Social History Narrative   Not on file   Social Determinants of Health   Financial Resource Strain: Not on file  Food Insecurity: Not on file  Transportation Needs: Not on file  Physical Activity: Not on file  Stress: Not on file  Social Connections: Not on file  Intimate Partner Violence: Not on file    Past Surgical Hx:  Past Surgical History:  Procedure Laterality Date   ABDOMINAL HYSTERECTOMY  1995 approx   ANTERIOR CERVICAL DECOMP/DISCECTOMY FUSION  11-30-2009   C4 -- C6   CO2 LASER APPLICATION Bilateral 07/23/2015   Procedure: BILATERAL CO2 LASER ABLATION OF THE VULVA;  Surgeon: Adolphus Birchwood, MD;  Location: [facility];  Service: Gynecology;  Laterality: Bilateral;   CO2 LASER APPLICATION N/A 02/25/2016   Procedure: CO2 LASER OF THE VULVAR;  Surgeon: Adolphus Birchwood, MD;  Location: [facility];  Service: Gynecology;  Laterality: N/A;   CO2 LASER APPLICATION  N/A 05/04/2017   Procedure: CO2 LASER VAPORIZATION OF THE VULVA;  Surgeon: Adolphus Birchwood, MD;  Location: [facility] [place];  Service: Gynecology;  Laterality: N/A;   CO2 LASER APPLICATION N/A 12/27/2018   Procedure: CO2 LASER APPLICATION OF VULVA;  Surgeon: Adolphus Birchwood, MD;  Location: [facility];  Service: Gynecology;  Laterality: N/A;   COLONOSCOPY  01-28-2015   CORONARY ARTERY BYPASS GRAFT N/A 11/25/2017   Procedure: CORONARY ARTERY BYPASS GRAFTING (CABG), ON PUMP, TIMES THREE, USING LEFT INTERNAL MAMMARY ARTERY AND ENDOSCOPICALLY HARVESTED LEFT GREATER SAPHENOUS VEIN;  Surgeon: Purcell Nails, MD;  Location: [facility] OR;  Service: Open Heart Surgery;  Laterality: N/A;   I & D LEFT BUTTOCK ABSCESS  04-28-2001   LEFT HEART CATH AND CORONARY ANGIOGRAPHY N/A 11/22/2017   Procedure: LEFT HEART CATH AND CORONARY ANGIOGRAPHY;  Surgeon: Runell Gess, MD;  Location: [facility];  Service: Cardiovascular;  Laterality: N/A;   TEE WITHOUT CARDIOVERSION N/A 11/25/2017   Procedure: TRANSESOPHAGEAL ECHOCARDIOGRAM (TEE);  Surgeon: Purcell Nails, MD;  Location: [facility] OR;  Service: Open Heart Surgery;  Laterality: N/A;   VULVECTOMY Right 05/06/2015   Procedure: WIDE LOCAL  EXCISION  OF RIGHT VULVA;  Surgeon: Adolphus Birchwood, MD;  Location: [facility];  Service: Gynecology;  Laterality: Right;   VULVECTOMY N/A 05/04/2017   Procedure: WIDE EXCISION VULVECTOMY;  Surgeon: Adolphus Birchwood, MD;  Location: [facility];  Service: Gynecology;  Laterality: N/A;   VULVECTOMY N/A 12/27/2018   Procedure: WIDE EXCISION VULVECTOMY;  Surgeon: Adolphus Birchwood, MD;  Location: [facility];  Service: Gynecology;  Laterality: N/A;    Past Medical Hx:  Past Medical History:  Diagnosis Date   Coronary artery disease    History of condyloma acuminatum    History of uterine fibroid    History of vaginal dysplasia    VAIN III--  oncologist-  dr Andrey Farmer   History of vulvar  dysplasia    VIN III   gyn-oncologist-  dr Andrey Farmer   HIV infection [facility]) [facility]-  dr hatcher   Hypertension    Hypertriglyceridemia without hypercholesterolemia    PONV (postoperative nausea and vomiting)    VIN III (vulvar intraepithelial neoplasia III)    Wears glasses     Family Hx:  Family History  Problem Relation Age of Onset   Cataracts Mother    Hypertension Father    CAD Paternal Grandfather    Colon cancer Neg Hx     Vitals:  There were no vitals taken for this visit.  Physical Exam:  Physical Exam HENT:     Head: Normocephalic and atraumatic.  Abdominal:     Palpations: There is no mass.     Tenderness: There is no abdominal tenderness.     Hernia: No hernia is present.  Genitourinary:    Rectum: Normal.  Musculoskeletal:     Cervical back: Neck supple.     Right lower leg: No edema.     Left lower leg: No edema.  Lymphadenopathy:     Cervical:     Right cervical: No superficial cervical adenopathy.    Left cervical: No superficial cervical adenopathy.     Lower Body: No right inguinal adenopathy. No left inguinal adenopathy.     Assessment/Plan: Antionette Char, MD 08/19/2021, 7:43 PM

## 2021-08-19 NOTE — Assessment & Plan Note (Signed)
Recurrent VAIN 3 and VIN III. Immunosuppression with HIV, Pap smears with persistent low-grade SIL. S/p CO2 laser of vulva (circumferential, clitoris) with right WLE in February, 2020 for VIN 3 and AIN3. Persistent LGSIL pap, high risk HPV infection, persistent VIN.  Plan:    Will continue to follow VIN and AIN closely w/regular surveillance and biopsy of any more concerning areas. Review the Pap smear performed at today's visit Return in 1 yr

## 2021-08-20 ENCOUNTER — Inpatient Hospital Stay: Payer: Medicare HMO | Admitting: Obstetrics & Gynecology

## 2021-08-20 DIAGNOSIS — D071 Carcinoma in situ of vulva: Secondary | ICD-10-CM

## 2021-08-25 ENCOUNTER — Other Ambulatory Visit: Payer: Self-pay | Admitting: Nurse Practitioner

## 2021-08-25 ENCOUNTER — Telehealth: Payer: Self-pay

## 2021-08-25 DIAGNOSIS — E785 Hyperlipidemia, unspecified: Secondary | ICD-10-CM

## 2021-08-25 MED ORDER — GEMFIBROZIL 600 MG PO TABS: 600.0000 mg | ORAL_TABLET | Freq: Two times a day (BID) | ORAL | 3 refills | Status: DC

## 2021-08-25 NOTE — Telephone Encounter (Signed)
Gemfibrozil 600 mg

## 2021-09-09 NOTE — Assessment & Plan Note (Addendum)
Recurrent VAIN 3 and VIN 3.  Immunosuppression w/HIV, Pap smears w/persistent low-grade SIL, hrHPV infection S/P CO2 laser of vulva (circumferential, clitoris) w/right WLDE in 2/20 for VIN 3 and AIN 3   >Review the histology from the bx performed at today's visit >Continue to follow VIN and AIN w/regular surveillance and bx of any concerning areas

## 2021-09-09 NOTE — Progress Notes (Signed)
Follow Up Note: Gyn-Onc  Cathy Smith 55 y.o. female  CC: She presents for a f/u exam  HPI:  55 year old African American female seen in consultation requested of [name], and [name] regarding management of a newly diagnosed severe dysplasia of the upper vagina. Briefly, the patient's history includes having a past history of abdominal hysterectomy for what we understand and have been fibroids. She denies any problems with Pap smears prior to the hysterectomy. More recently she has had some abnormal Pap smears ultimately resulting in colposcopy and directed biopsy on 04/07/2012. 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   The patient did well until March 2015 with a Pap smear showing high-grade dysplasia once again. In April and May 2015 the patient received 2 additional months of Efudex. From June 2015 through February 2016 her Pap smears only showed low-grade SIL.   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 She was scheduled for a wide local excision of the right anterior vulva which was performed on 05/06/15 and revealed VIN3. Margins were positive. Intraoperative evaluation showed lesions consistent with VIN3 on bilateral posterior labia. These were biopsied (but not treated at the patient's request). These biopsies also returned as positive for VIN3.   On 07/23/15 she was taken to the OR for an extensive CO2 laser ablation of the circumferential labia minora and majora to ablate all acetowhite areas. No pathology was taken. The clitoris was spared.    Her VIN 3 recurred and on 02/25/16 she returned to the OR for CO2 laser which included the anterior vulva and clitoris.   Pap in March, 2018 showed LGSIL Pap in March 2019  showed LGSIL , high risk HPV present.    Her last Pap smear in March 2018 showed LGSIL + hrHPV.    She had open heart surgery in 2019. Since that time she has exhibited increased confusion and difficulty following instructions.    She underwent WLE right vulva and CO2 laser of anterior vulva on 05/04/17. She has no complaints. Biopsies on February, 2020 of the anterior and posterior vulva showed VIN 3.   She was taken to the OR on 12/27/18 for a wide local excision of the anterior and posterior vulva and CO2 laser of the periclitoral tissues. 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 All lesions showed VIN 3/AIN 3.   Pap 12/13/18 was stable with LGSIL and positive high risk HPV present.  Pap 5/21 showed ASCUS and positive hight risk HPV present.  Interval History: She denies any new lesions or itching.    Review of Systems  Review of Systems  Genitourinary:        Negative for itching    Current Meds:  Outpatient Encounter Medications as of 09/10/2021  Medication Sig   aspirin EC 81 MG tablet Take 81 mg by mouth daily. Swallow whole.   darunavir (PREZISTA) 800 MG tablet Take 1 tablet (800 mg total) by mouth daily with breakfast.   elvitegravir-cobicistat-emtricitabine-tenofovir (GENVOYA) 150-150-200-10 MG TABS tablet Take 1 tablet by mouth daily with breakfast.   furosemide (LASIX) 40 MG tablet Take one tablet by mouth daily   gabapentin (NEURONTIN) 100 MG capsule Take 1 capsule (100 mg total) by mouth 3 (three) times daily. (Take with Gabapentin 300 mg = 400 mg total  dose TID)   gabapentin (NEURONTIN) 300 MG capsule Take 1 capsule (300 mg total) by mouth 3 (three) times daily. (Take with Gabapentin 100 mg = 400 mg total dose TID)   gemfibrozil (LOPID) 600 MG tablet Take 1 tablet (600 mg total) by mouth 2 (two) times daily before a meal.   hydrOXYzine (ATARAX/VISTARIL) 10 MG tablet Take 1 tablet (10 mg total) by mouth 3 (three) times daily  as needed.   metoprolol tartrate (LOPRESSOR) 25 MG tablet TAKE 1 AND 1/2 TABLETS(37.5 MG) BY MOUTH TWICE DAILY   triamcinolone (KENALOG) 0.1 % Apply 1 application topically 2 (two) times daily.   [DISCONTINUED] atorvastatin (LIPITOR) 20 MG tablet TAKE 1 TABLET(20 MG) BY MOUTH DAILY AT 6 PM   No facility-administered encounter medications on file as of 09/10/2021.    Allergy: No Known Allergies  Social Hx:   Social History   Socioeconomic History   Marital status: Married    Spouse name: Not on file   Number of children: Not on file   Years of education: Not on file   Highest education level: Not on file  Occupational History   Not on file  Tobacco Use   Smoking status: Never   Smokeless tobacco: Never  Vaping Use   Vaping Use: Never used  Substance and Sexual Activity   Alcohol use: No    Alcohol/week: 0.0 standard drinks   Drug use: No   Sexual activity: Yes    Partners: Male    Birth control/protection: Condom    Comment: pt. given condoms  Other Topics Concern   Not on file  Social History Narrative   Not on file   Social Determinants of Health   Financial Resource Strain: Not on file  Food Insecurity: Not on file  Transportation Needs: Not on file  Physical Activity: Not on file  Stress: Not on file  Social Connections: Not on file  Intimate Partner Violence: Not on file    Past Surgical Hx:  Past Surgical History:  Procedure Laterality Date   ABDOMINAL HYSTERECTOMY  1995 approx   ANTERIOR CERVICAL DECOMP/DISCECTOMY FUSION  11-30-2009   C4 -- C6   CO2 LASER APPLICATION Bilateral 07/23/2015   Procedure: BILATERAL CO2 LASER ABLATION OF THE VULVA;  Surgeon: Adolphus Birchwood, MD;  Location: [facility];  Service: Gynecology;  Laterality: Bilateral;   CO2 LASER APPLICATION N/A 02/25/2016   Procedure: CO2 LASER OF THE VULVAR;  Surgeon: Adolphus Birchwood, MD;  Location: [facility];  Service: Gynecology;  Laterality: N/A;   CO2 LASER APPLICATION  N/A 05/04/2017   Procedure: CO2 LASER VAPORIZATION OF THE VULVA;  Surgeon: Adolphus Birchwood, MD;  Location: [facility];  Service: Gynecology;  Laterality: N/A;   CO2 LASER APPLICATION N/A 12/27/2018   Procedure: CO2 LASER APPLICATION OF VULVA;  Surgeon: Adolphus Birchwood, MD;  Location: [facility];  Service: Gynecology;  Laterality: N/A;   COLONOSCOPY  01-28-2015   CORONARY ARTERY BYPASS GRAFT N/A 11/25/2017   Procedure: CORONARY ARTERY BYPASS GRAFTING (CABG), ON PUMP, TIMES THREE, USING LEFT INTERNAL MAMMARY ARTERY AND ENDOSCOPICALLY HARVESTED LEFT GREATER SAPHENOUS VEIN;  Surgeon: Purcell Nails, MD;  Location: [facility] OR;  Service: Open Heart Surgery;  Laterality: N/A;   I & D LEFT BUTTOCK ABSCESS  04-28-2001   LEFT HEART CATH AND CORONARY ANGIOGRAPHY N/A 11/22/2017   Procedure: LEFT HEART CATH AND CORONARY ANGIOGRAPHY;  Surgeon: Runell Gess, MD;  Location: [facility];  Service: Cardiovascular;  Laterality: N/A;   TEE WITHOUT CARDIOVERSION N/A 11/25/2017   Procedure: TRANSESOPHAGEAL ECHOCARDIOGRAM (TEE);  Surgeon: Purcell Nails, MD;  [facility] OR;  Service: Open Heart Surgery;  Laterality: N/A;   VULVECTOMY Right 05/06/2015   Procedure: WIDE LOCAL  EXCISION  OF RIGHT VULVA;  Surgeon: Adolphus Birchwood, MD;  Location: [facility] [place];  Service: Gynecology;  Laterality: Right;   VULVECTOMY N/A 05/04/2017   Procedure: WIDE EXCISION VULVECTOMY;  Surgeon: Adolphus Birchwood, MD;  Location: [facility];  Service: Gynecology;  Laterality: N/A;   VULVECTOMY N/A 12/27/2018   Procedure: WIDE EXCISION VULVECTOMY;  Surgeon: Adolphus Birchwood, MD;  Location: [facility];  Service: Gynecology;  Laterality: N/A;    Past Medical Hx:  Past Medical History:  Diagnosis Date   Coronary artery disease    History of condyloma acuminatum    History of uterine fibroid    History of vaginal dysplasia    VAIN III--  oncologist-  dr Andrey Farmer   History of vulvar  dysplasia    VIN III   gyn-oncologist-  dr Andrey Farmer   HIV infection [facility]) [facility]  dr hatcher   Hypertension    Hypertriglyceridemia without hypercholesterolemia    PONV (postoperative nausea and vomiting)    VIN III (vulvar intraepithelial neoplasia III)    Wears glasses     Family Hx:  Family History  Problem Relation Age of Onset   Cataracts Mother    Hypertension Father    CAD Paternal Grandfather    Colon cancer Neg Hx     Vitals:  BP 124/82 (BP Location: Right Arm, Patient Position: Sitting)   Pulse 75   Temp 98.7 F (37.1 C) (Oral)   Resp 18   Wt 159 lb 4 oz (72.2 kg)   SpO2 99%   BMI 29.13 kg/m    Physical Exam:  Physical Exam HENT:     Head: Normocephalic and atraumatic.  Abdominal:     Palpations: There is no mass.     Tenderness: There is no abdominal tenderness.     Hernia: No hernia is present.  Genitourinary:    Vagina: Normal. No lesions.     Rectum: Normal.       Comments: Hyperpigmented, irregular pigment, plaques Musculoskeletal:     Cervical back: Neck supple.     Right lower leg: No edema.     Left lower leg: No edema.  Lymphadenopathy:     Cervical:     Right cervical: No superficial cervical adenopathy.    Left cervical: No superficial cervical adenopathy.     Lower Body: No right inguinal adenopathy. No left inguinal adenopathy.    Punch Biopsy Procedure Note  Pre-operative Diagnosis: Suspicious lesion  Post-operative Diagnosis: same  Locations:right  vulva  Indications: H/O VIN III  Anesthesia: Lidocaine 2% with epinephrine   Procedure Details  History of allergy to iodine: no Patient informed of the risks (including bleeding and infection) and benefits of the  procedure and Verbal informed consent obtained.  The lesion and surrounding area was given a sterile prep using betadyne. The skin was then stretched perpendicular to the skin tension lines and the lesion removed using the 3 mm punch.  Silver nitrate was applied to the resulting ellipse. The specimen was sent for pathologic examination. The patient tolerated the procedure well.  EBL: minimal  Findings: See above  Condition: Stable  Complications: none.  Plan: 1. Instructed to keep the wound clean. 2. Warning  signs of infection were reviewed.   3. Recommended that the patient use OTC analgesics as needed for pain.   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  Immunosuppression w/HIV, Pap smears w/persistent low-grade SIL, hrHPV infection S/P CO2 laser of vulva (circumferential, clitoris) w/right WLDE in 2/20 for VIN 3 and AIN 3   >Review the histology from the bx performed at today's visit >Continue to follow VIN and AIN w/regular surveillance and bx of any concerning areas   Antionette Char, MD 09/09/2021, 7:00 PM

## 2021-09-10 ENCOUNTER — Inpatient Hospital Stay: Payer: Medicare HMO | Attending: Obstetrics & Gynecology | Admitting: Obstetrics & Gynecology

## 2021-09-10 ENCOUNTER — Other Ambulatory Visit (HOSPITAL_COMMUNITY)
Admission: RE | Admit: 2021-09-10 | Discharge: 2021-09-10 | Disposition: A | Discharge status: Home / Self Care | Payer: Medicare HMO | Source: Ambulatory Visit | Attending: Obstetrics & Gynecology | Admitting: Obstetrics & Gynecology

## 2021-09-10 ENCOUNTER — Other Ambulatory Visit: Payer: Self-pay

## 2021-09-10 VITALS — BP 124/82 | HR 75 | Temp 98.7°F | Resp 18 | Wt 159.2 lb

## 2021-09-10 DIAGNOSIS — Z9079 Acquired absence of other genital organ(s): Secondary | ICD-10-CM | POA: Insufficient documentation

## 2021-09-10 DIAGNOSIS — Z79899 Other long term (current) drug therapy: Secondary | ICD-10-CM | POA: Insufficient documentation

## 2021-09-10 DIAGNOSIS — D071 Carcinoma in situ of vulva: Secondary | ICD-10-CM | POA: Diagnosis not present

## 2021-09-10 DIAGNOSIS — N763 Subacute and chronic vulvitis: Secondary | ICD-10-CM | POA: Diagnosis not present

## 2021-09-10 DIAGNOSIS — Z01419 Encounter for gynecological examination (general) (routine) without abnormal findings: Secondary | ICD-10-CM | POA: Diagnosis not present

## 2021-09-10 DIAGNOSIS — Z86002 Personal history of in-situ neoplasm of other and unspecified genital organs: Secondary | ICD-10-CM | POA: Insufficient documentation

## 2021-09-10 DIAGNOSIS — R87810 Cervical high risk human papillomavirus (HPV) DNA test positive: Secondary | ICD-10-CM | POA: Insufficient documentation

## 2021-09-10 DIAGNOSIS — Z21 Asymptomatic human immunodeficiency virus [HIV] infection status: Secondary | ICD-10-CM | POA: Insufficient documentation

## 2021-09-10 DIAGNOSIS — Z1151 Encounter for screening for human papillomavirus (HPV): Secondary | ICD-10-CM | POA: Diagnosis not present

## 2021-09-10 DIAGNOSIS — Z124 Encounter for screening for malignant neoplasm of cervix: Secondary | ICD-10-CM | POA: Insufficient documentation

## 2021-09-10 DIAGNOSIS — N904 Leukoplakia of vulva: Secondary | ICD-10-CM | POA: Diagnosis not present

## 2021-09-10 DIAGNOSIS — D072 Carcinoma in situ of vagina: Secondary | ICD-10-CM | POA: Diagnosis not present

## 2021-09-10 DIAGNOSIS — Z08 Encounter for follow-up examination after completed treatment for malignant neoplasm: Secondary | ICD-10-CM | POA: Insufficient documentation

## 2021-09-10 DIAGNOSIS — L819 Disorder of pigmentation, unspecified: Secondary | ICD-10-CM | POA: Diagnosis not present

## 2021-09-10 DIAGNOSIS — Z90710 Acquired absence of both cervix and uterus: Secondary | ICD-10-CM | POA: Insufficient documentation

## 2021-09-10 NOTE — Patient Instructions (Signed)
Return in 6 months

## 2021-09-11 ENCOUNTER — Encounter: Payer: Self-pay | Admitting: Obstetrics & Gynecology

## 2021-09-15 LAB — SURGICAL PATHOLOGY

## 2021-09-19 ENCOUNTER — Telehealth: Payer: Self-pay

## 2021-09-19 LAB — CYTOLOGY - PAP
Comment: NEGATIVE
High risk HPV: POSITIVE — AB

## 2021-09-19 NOTE — Telephone Encounter (Signed)
Received call from Cathy Smith inquiring about her recent biopsy and pap smear results from 09/10/21. Patient states the results released to her on mychart and she wants to know if she needs to do anything and what the results mean. Instructed patient that Dr. Tamela Oddi has not reviewed the results yet. Once she has reviewed them our office will contact her, this will most likely be Monday as Dr. Tamela Oddi is not in office today. Patient verbalized understanding.

## 2021-09-22 NOTE — Telephone Encounter (Signed)
Following up with Ms. Cathy Smith. Warner Mccreedy, NP reviewed pap smear and biopsy results. Biopsy is negative for pre-cancer or cancer. Pap smear showing low grade changes HPV positive. This is stable compared to previous pap smear. Patient verbalized understanding. Instructed to follow up in 6 months or sooner if needed. Patient to call after the new year to schedule appointment.

## 2021-10-09 ENCOUNTER — Ambulatory Visit: Payer: Medicare HMO

## 2021-10-10 ENCOUNTER — Ambulatory Visit
Admission: RE | Admit: 2021-10-10 | Discharge: 2021-10-10 | Disposition: A | Discharge status: Home / Self Care | Payer: Medicare HMO | Source: Ambulatory Visit | Attending: Nurse Practitioner | Admitting: Nurse Practitioner

## 2021-10-10 DIAGNOSIS — Z1231 Encounter for screening mammogram for malignant neoplasm of breast: Secondary | ICD-10-CM

## 2021-11-03 ENCOUNTER — Other Ambulatory Visit: Payer: Self-pay | Admitting: Infectious Diseases

## 2021-11-03 DIAGNOSIS — B20 Human immunodeficiency virus [HIV] disease: Secondary | ICD-10-CM

## 2021-11-04 ENCOUNTER — Telehealth: Payer: Self-pay

## 2021-11-04 NOTE — Telephone Encounter (Signed)
Patient called triage to ask why her refills are not being approved. I have explained that meds were sent in 11/04/2021 today at 12:17 pm. Patient called yesterday and was told to call [facility]. I did go over the amount sent and reminded her of next appt.

## 2021-11-14 ENCOUNTER — Encounter: Payer: Self-pay | Admitting: Nurse Practitioner

## 2021-11-14 ENCOUNTER — Other Ambulatory Visit: Payer: Self-pay

## 2021-11-14 ENCOUNTER — Ambulatory Visit (INDEPENDENT_AMBULATORY_CARE_PROVIDER_SITE_OTHER): Payer: Medicare PPO | Admitting: Nurse Practitioner

## 2021-11-14 VITALS — BP 130/75 | HR 78 | Ht 61.02 in | Wt 158.0 lb

## 2021-11-14 DIAGNOSIS — Z01419 Encounter for gynecological examination (general) (routine) without abnormal findings: Secondary | ICD-10-CM | POA: Diagnosis not present

## 2021-11-14 DIAGNOSIS — R8290 Unspecified abnormal findings in urine: Secondary | ICD-10-CM | POA: Diagnosis not present

## 2021-11-14 NOTE — Patient Instructions (Signed)

## 2021-11-14 NOTE — Progress Notes (Signed)
° °  [facility] Patient [facility] [address] [place], [place]  [address] Phone:  [phone]   Fax:  [phone]     Subjective:     Cathy Smith is a 56 y.o. female and is here for a comprehensive physical exam. The patient reports problems - urinary odor .  Social History   Socioeconomic History   Marital status: Married    Spouse name: Not on file   Number of children: Not on file   Years of education: Not on file   Highest education level: Not on file  Occupational History   Not on file  Tobacco Use   Smoking status: Never   Smokeless tobacco: Never  Vaping Use   Vaping Use: Never used  Substance and Sexual Activity   Alcohol use: No    Alcohol/week: 0.0 standard drinks   Drug use: No   Sexual activity: Yes    Partners: Male    Birth control/protection: Condom    Comment: pt. given condoms  Other Topics Concern   Not on file  Social History Narrative   Not on file   Social Determinants of Health   Financial Resource Strain: Not on file  Food Insecurity: Not on file  Transportation Needs: Not on file  Physical Activity: Not on file  Stress: Not on file  Social Connections: Not on file  Intimate Partner Violence: Not on file   Health Maintenance  Topic Date Due   Zoster Vaccines- Shingrix (1 of 2) Never done   PAP SMEAR-Modifier  09/10/2022   MAMMOGRAM  10/11/2023   TETANUS/TDAP  09/06/2024   COLONOSCOPY (Pts 45-75yrs Insurance coverage will need to be confirmed)  01/27/2025   Pneumococcal Vaccine 61-40 Years old (4 - PPSV23 if available, else PCV20) 11/17/2028   INFLUENZA VACCINE  Completed   COVID-19 Vaccine  Completed   Hepatitis C Screening  Completed   HIV Screening  Completed   HPV VACCINES  Aged Out    The following portions of the patient's history were reviewed and updated as appropriate: allergies, current medications, past family history, past medical history, past social history, past surgical history, and problem list.  Review of  Systems Pertinent items are noted in HPI.   Objective:    General appearance: alert, cooperative, and no distress Pelvic: exam obscured related to angle of cervix external genitalia normal, no adnexal masses or tenderness, no cervical motion tenderness, rectovaginal septum normal, uterus normal size, shape, and consistency, and vagina normal without discharge Extremities: extremities normal, atraumatic, no cyanosis or edema Skin: Skin color, texture, turgor normal. No rashes or lesions Neurologic: Alert and oriented X 3, normal strength and tone. Normal symmetric reflexes. Normal coordination and gait    Assessment:    Healthy female exam.     1. Encounter for gynecological examination without abnormal finding - IGP, rfx Aptima HPV ASCU  2. Abnormal urine odor - Urine Culture    Plan:     See After Visit Summary for Counseling Recommendations

## 2021-11-15 ENCOUNTER — Encounter: Payer: Self-pay | Admitting: Nurse Practitioner

## 2021-11-18 LAB — IGP, RFX APTIMA HPV ASCU

## 2021-11-18 LAB — URINE CULTURE

## 2021-11-23 ENCOUNTER — Other Ambulatory Visit: Payer: Self-pay | Admitting: Nurse Practitioner

## 2021-11-23 DIAGNOSIS — N390 Urinary tract infection, site not specified: Secondary | ICD-10-CM

## 2021-11-23 DIAGNOSIS — B9620 Unspecified Escherichia coli [E. coli] as the cause of diseases classified elsewhere: Secondary | ICD-10-CM

## 2021-11-23 MED ORDER — NITROFURANTOIN MONOHYD MACRO 100 MG PO CAPS: 100.0000 mg | ORAL_CAPSULE | Freq: Two times a day (BID) | ORAL | 0 refills | Status: AC

## 2021-12-23 ENCOUNTER — Telehealth: Payer: Self-pay

## 2021-12-23 NOTE — Telephone Encounter (Signed)
Pt called and he stated that the gabapentin taken is giving them a strong urine smell and would adv to take something else?

## 2021-12-25 NOTE — Telephone Encounter (Signed)
Per Crystal patient will have to be seen in the office for further evaluation.

## 2022-01-02 ENCOUNTER — Other Ambulatory Visit: Payer: Self-pay

## 2022-01-02 ENCOUNTER — Encounter: Payer: Self-pay | Admitting: Nurse Practitioner

## 2022-01-02 ENCOUNTER — Ambulatory Visit (INDEPENDENT_AMBULATORY_CARE_PROVIDER_SITE_OTHER): Payer: Medicare PPO | Admitting: Nurse Practitioner

## 2022-01-02 VITALS — BP 109/82 | HR 67 | Temp 98.0°F | Ht 61.0 in | Wt 158.0 lb

## 2022-01-02 DIAGNOSIS — E785 Hyperlipidemia, unspecified: Secondary | ICD-10-CM

## 2022-01-02 DIAGNOSIS — B20 Human immunodeficiency virus [HIV] disease: Secondary | ICD-10-CM | POA: Diagnosis not present

## 2022-01-02 DIAGNOSIS — I10 Essential (primary) hypertension: Secondary | ICD-10-CM | POA: Diagnosis not present

## 2022-01-02 DIAGNOSIS — R82998 Other abnormal findings in urine: Secondary | ICD-10-CM | POA: Diagnosis not present

## 2022-01-02 DIAGNOSIS — I208 Other forms of angina pectoris: Secondary | ICD-10-CM

## 2022-01-02 DIAGNOSIS — R8290 Unspecified abnormal findings in urine: Secondary | ICD-10-CM | POA: Diagnosis not present

## 2022-01-02 DIAGNOSIS — I739 Peripheral vascular disease, unspecified: Secondary | ICD-10-CM

## 2022-01-02 LAB — POCT URINALYSIS DIP (CLINITEK)
Bilirubin, UA: NEGATIVE
Blood, UA: NEGATIVE
Glucose, UA: NEGATIVE mg/dL
Ketones, POC UA: NEGATIVE mg/dL
Nitrite, UA: POSITIVE — AB
Spec Grav, UA: 1.02 (ref 1.010–1.025)
Urobilinogen, UA: 0.2 E.U./dL
pH, UA: 5.5 (ref 5.0–8.0)

## 2022-01-02 MED ORDER — PREGABALIN 100 MG PO CAPS
100.0000 mg | ORAL_CAPSULE | Freq: Two times a day (BID) | ORAL | 2 refills | Status: DC
Start: 2022-01-02 — End: 2022-09-03

## 2022-01-02 NOTE — Progress Notes (Signed)
[place], [place]  [address] Phone:  [phone]   Fax:  [phone]   Acute Office Visit  Subjective:    Patient ID: Cathy Smith, female    DOB: 03-22-1966, 56 y.o.   MRN: [id number]  Chief Complaint  Patient presents with   Follow-up    Pt is here today to discuss a foul odor when urinating.    HPI Patient is in today for follow up. She  has a past medical history of Coronary artery disease, History of condyloma acuminatum, History of uterine fibroid, History of vaginal dysplasia, History of vulvar dysplasia, HIV infection ([place]) ([facility] hat[facility]), Hypertension, Hypertriglyceridemia without hypercholesterolemia, PONV (postoperative nausea and vomiting), VIN III (vulvar intraepithelial neoplasia III), and Wears glasses.   Urinary Tract Infection Patient complains of foul smelling urine. She has had symptoms for several  months possible year . Patient denies back pain, congestion, cough, fever, headache, rhinitis, sorethroat, stomach ache, and vaginal discharge. Patient does not have a history of recurrent UTI. Patient does not have a history of pyelonephritis. She is currently prescribed  Gabapentin 400 mg TID for neuropathy.  She feels like the odor is related to the Gabapentin. She desires to have this changed.  Past Medical History:  Diagnosis Date   Coronary artery disease    History of condyloma acuminatum    History of uterine fibroid    History of vaginal dysplasia    VAIN III--  oncologist-  dr Denman George   History of vulvar dysplasia    VIN III   gyn-oncologist-  dr Denman George   HIV infection [facility]) [facility]  dr hatcher   Hypertension    Hypertriglyceridemia without hypercholesterolemia    PONV (postoperative nausea and vomiting)    VIN III (vulvar intraepithelial neoplasia III)    Wears glasses     Past Surgical History:  Procedure Laterality Date   ABDOMINAL  HYSTERECTOMY  1995 approx   ANTERIOR CERVICAL DECOMP/DISCECTOMY FUSION  11-30-2009   C4 -- C6   CO2 LASER APPLICATION Bilateral 4/84/7207   Procedure: BILATERAL CO2 LASER ABLATION OF THE VULVA;  Surgeon: Everitt Amber, MD;  Location: [place];  Service: Gynecology;  Laterality: Bilateral;   CO2 LASER APPLICATION N/A 12/27/2881   Procedure: CO2 LASER OF THE VULVAR;  Surgeon: Everitt Amber, MD;  Location: [facility];  Service: Gynecology;  Laterality: N/A;   CO2 LASER APPLICATION N/A 3/74/4514   Procedure: CO2 LASER VAPORIZATION OF THE VULVA;  Surgeon: Everitt Amber, MD;  Location: [place];  Service: Gynecology;  Laterality: N/A;   CO2 LASER APPLICATION N/A 04/12/7997   Procedure: CO2 LASER APPLICATION OF VULVA;  Surgeon: Everitt Amber, MD;  Location: [facility];  Service: Gynecology;  Laterality: N/A;   COLONOSCOPY  01-28-2015   CORONARY ARTERY BYPASS GRAFT N/A 11/25/2017   Procedure: CORONARY ARTERY BYPASS GRAFTING (CABG), ON PUMP, TIMES THREE, USING LEFT INTERNAL MAMMARY ARTERY AND ENDOSCOPICALLY HARVESTED LEFT GREATER SAPHENOUS VEIN;  Surgeon: Rexene Alberts, MD;  Location: [place];  Service: Open Heart Surgery;  Laterality: N/A;   I & D LEFT BUTTOCK ABSCESS  04-28-2001   LEFT HEART CATH AND CORONARY ANGIOGRAPHY N/A 11/22/2017   Procedure: LEFT HEART CATH AND CORONARY ANGIOGRAPHY;  Surgeon: Lorretta Harp, MD;  Location: [place] CV [facility];  Service: Cardiovascular;  Laterality: N/A;   TEE WITHOUT CARDIOVERSION N/A 11/25/2017  Procedure: TRANSESOPHAGEAL ECHOCARDIOGRAM (TEE);  Surgeon: Rexene Alberts, MD;  Location: [place];  Service: Open Heart Surgery;  Laterality: N/A;   VULVECTOMY Right 05/06/2015   Procedure: WIDE LOCAL  EXCISION  OF RIGHT VULVA;  Surgeon: Everitt Amber, MD;  Location: [place];  Service: Gynecology;  Laterality: Right;   VULVECTOMY N/A 05/04/2017   Procedure: WIDE EXCISION VULVECTOMY;  Surgeon: Everitt Amber, MD;  Location: [facility];  Service: Gynecology;  Laterality: N/A;   VULVECTOMY N/A 12/27/2018   Procedure: WIDE EXCISION VULVECTOMY;  Surgeon: Everitt Amber, MD;  Location: [facility];  Service: Gynecology;  Laterality: N/A;    Family History  Problem Relation Age of Onset   Cataracts Mother    Hypertension Father    CAD Paternal Grandfather    Colon cancer Neg Hx     Social History   Socioeconomic History   Marital status: Married    Spouse name: Not on file   Number of children: Not on file   Years of education: Not on file   Highest education level: Not on file  Occupational History   Not on file  Tobacco Use   Smoking status: Never   Smokeless tobacco: Never  Vaping Use   Vaping Use: Never used  Substance and Sexual Activity   Alcohol use: No    Alcohol/week: 0.0 standard drinks   Drug use: No   Sexual activity: Yes    Partners: Male    Birth control/protection: Condom    Comment: pt. given condoms  Other Topics Concern   Not on file  Social History Narrative   Not on file   Social Determinants of Health   Financial Resource Strain: Not on file  Food Insecurity: Not on file  Transportation Needs: Not on file  Physical Activity: Not on file  Stress: Not on file  Social Connections: Not on file  Intimate Partner Violence: Not on file    Outpatient Medications Prior to Visit  Medication Sig Dispense Refill   aspirin EC 81 MG tablet Take 81 mg by mouth daily. Swallow whole.     furosemide (LASIX) 40 MG tablet Take one tablet by mouth daily 90 tablet 3   gemfibrozil (LOPID) 600 MG tablet Take 1 tablet (600 mg total) by mouth 2 (two) times daily before a meal. 180 tablet 3   GENVOYA 150-150-200-10 MG TABS tablet TAKE 1 TABLET BY MOUTH DAILY WITH BREAKFAST 30 tablet 5   hydrOXYzine (ATARAX/VISTARIL) 10 MG tablet Take 1 tablet (10 mg total) by mouth 3 (three) times daily as needed. 90 tablet 1   metoprolol tartrate (LOPRESSOR)  25 MG tablet TAKE 1 AND 1/2 TABLETS(37.5 MG) BY MOUTH TWICE DAILY 270 tablet 3   PREZISTA 800 MG tablet TAKE 1 TABLET(800 MG) BY MOUTH DAILY WITH BREAKFAST 30 tablet 5   gabapentin (NEURONTIN) 100 MG capsule Take 1 capsule (100 mg total) by mouth 3 (three) times daily. (Take with Gabapentin 300 mg = 400 mg total dose TID) 270 capsule 3   gabapentin (NEURONTIN) 300 MG capsule Take 1 capsule (300 mg total) by mouth 3 (three) times daily. (Take with Gabapentin 100 mg = 400 mg total dose TID) 270 capsule 3   triamcinolone (KENALOG) 0.1 % Apply 1 application topically 2 (two) times daily. (Patient not taking: Reported on 09/10/2021) 45 each 6   No facility-administered medications prior to visit.    No Known Allergies  Review of Systems     Objective:  Physical Exam HENT:     Head: Normocephalic.     Nose: Nose normal.     Mouth/Throat:     Mouth: Mucous membranes are moist.  Cardiovascular:     Rate and Rhythm: Normal rate.     Pulses: Normal pulses.  Pulmonary:     Effort: Pulmonary effort is normal.     Breath sounds: Normal breath sounds.  Musculoskeletal:        General: Normal range of motion.     Cervical back: Normal range of motion.  Skin:    General: Skin is warm.     Capillary Refill: Capillary refill takes less than 2 seconds.  Neurological:     Mental Status: She is alert and oriented to person, place, and time.    BP 109/82    Pulse 67    Temp 98 F (36.7 C)    Ht '5\' 1"'  (1.549 m)    Wt 158 lb (71.7 kg)    SpO2 98%    BMI 29.85 kg/m  Wt Readings from Last 3 Encounters:  01/02/22 158 lb (71.7 kg)  11/14/21 158 lb (71.7 kg)  09/10/21 159 lb 4 oz (72.2 kg)    There are no preventive care reminders to display for this patient.   There are no preventive care reminders to display for this patient.   Lab Results  Component Value Date   TSH 0.918 11/15/2020   Lab Results  Component Value Date   WBC 3.7 (L) 08/04/2021   HGB 12.6 08/04/2021   HCT 37.8  08/04/2021   MCV 97.4 08/04/2021   PLT 212 08/04/2021   Lab Results  Component Value Date   NA 141 08/04/2021   K 3.8 08/04/2021   CO2 28 08/04/2021   GLUCOSE 130 (H) 08/04/2021   BUN 17 08/04/2021   CREATININE 1.10 (H) 08/04/2021   BILITOT 0.4 08/04/2021   ALKPHOS 104 11/15/2020   AST 19 08/04/2021   ALT 20 08/04/2021   PROT 8.1 08/04/2021   ALBUMIN 4.8 11/15/2020   CALCIUM 9.6 08/04/2021   ANIONGAP 8 12/27/2018   EGFR 59 (L) 08/04/2021   Lab Results  Component Value Date   CHOL 156 11/15/2020   Lab Results  Component Value Date   HDL 40 11/15/2020   Lab Results  Component Value Date   LDLCALC 68 11/15/2020   Lab Results  Component Value Date   TRIG 304 (H) 11/15/2020   Lab Results  Component Value Date   CHOLHDL 3.9 11/15/2020   Lab Results  Component Value Date   HGBA1C 6.8 (H) 08/04/2021       Assessment & Plan:   Problem List Items Addressed This Visit       Cardiovascular and Mediastinum   Essential hypertension Encouraged on going compliance with current medication regimen Encouraged home monitoring and recording BP <130/80 Eating a heart-healthy diet with less salt Encouraged regular physical activity  Recommend Weight loss     Relevant Orders   Comp. Metabolic Panel (12) (Completed)     Other   Human immunodeficiency virus (HIV) disease ([place]) (Chronic) Enourage patient to keep all follow-up visits with your infectious disease. Take your medicines as directed Get all recommended lab tests Encourage patient to make choices that keep you healthy and protect others.    Other Visit Diagnoses     Foul smelling urine    -  Primary   Relevant Orders   POCT URINALYSIS DIP (CLINITEK) (Completed)   Urine Culture  Hyperlipidemia, unspecified hyperlipidemia type       Relevant Orders   Lipid panel (Completed)   Leukocytes in urine       Relevant Orders   Urine Culture        Meds ordered this encounter  Medications   pregabalin  (LYRICA) 100 MG capsule    Sig: Take 1 capsule (100 mg total) by mouth 2 (two) times daily.    Dispense:  60 capsule    Refill:  2    Order Specific Question:   Supervising Provider    Answer:   Tresa Garter [[id number]]     Vevelyn Francois, NP

## 2022-01-03 LAB — COMP. METABOLIC PANEL (12)
AST: 12 IU/L (ref 0–40)
Albumin/Globulin Ratio: 1.6 (ref 1.2–2.2)
Albumin: 4.9 g/dL (ref 3.8–4.9)
Alkaline Phosphatase: 87 IU/L (ref 44–121)
BUN/Creatinine Ratio: 15 (ref 9–23)
BUN: 14 mg/dL (ref 6–24)
Bilirubin Total: 0.4 mg/dL (ref 0.0–1.2)
Calcium: 9.4 mg/dL (ref 8.7–10.2)
Chloride: 98 mmol/L (ref 96–106)
Creatinine, Ser: 0.93 mg/dL (ref 0.57–1.00)
Globulin, Total: 3 g/dL (ref 1.5–4.5)
Glucose: 170 mg/dL — ABNORMAL HIGH (ref 70–99)
Potassium: 3.9 mmol/L (ref 3.5–5.2)
Sodium: 139 mmol/L (ref 134–144)
Total Protein: 7.9 g/dL (ref 6.0–8.5)
eGFR: 71 mL/min/{1.73_m2} (ref >59)

## 2022-01-03 LAB — LIPID PANEL
Chol/HDL Ratio: 4.3 ratio (ref 0.0–4.4)
Cholesterol, Total: 219 mg/dL — ABNORMAL HIGH (ref 100–199)
HDL: 51 mg/dL
LDL Chol Calc (NIH): 149 mg/dL — ABNORMAL HIGH (ref 0–99)
Triglycerides: 105 mg/dL (ref 0–149)
VLDL Cholesterol Cal: 19 mg/dL (ref 5–40)

## 2022-02-18 ENCOUNTER — Ambulatory Visit: Payer: Medicare PPO | Admitting: Nurse Practitioner

## 2022-02-20 ENCOUNTER — Ambulatory Visit: Payer: Medicare PPO | Admitting: Nurse Practitioner

## 2022-03-03 ENCOUNTER — Encounter: Payer: Self-pay | Admitting: Nurse Practitioner

## 2022-03-03 ENCOUNTER — Ambulatory Visit (INDEPENDENT_AMBULATORY_CARE_PROVIDER_SITE_OTHER): Payer: Medicare PPO | Admitting: Nurse Practitioner

## 2022-03-03 VITALS — BP 119/84 | HR 61 | Temp 98.1°F | Ht 61.0 in | Wt 160.8 lb

## 2022-03-03 DIAGNOSIS — I10 Essential (primary) hypertension: Secondary | ICD-10-CM

## 2022-03-03 DIAGNOSIS — E119 Type 2 diabetes mellitus without complications: Secondary | ICD-10-CM

## 2022-03-03 LAB — POCT GLYCOSYLATED HEMOGLOBIN (HGB A1C)
HbA1c POC (<> result, manual entry): 6.8 % (ref 4.0–5.6)
HbA1c, POC (controlled diabetic range): 6.8 % (ref 0.0–7.0)
HbA1c, POC (prediabetic range): 6.8 % — AB (ref 5.7–6.4)
Hemoglobin A1C: 6.8 % — AB (ref 4.0–5.6)

## 2022-03-03 MED ORDER — METFORMIN HCL 500 MG PO TABS: 500.0000 mg | ORAL_TABLET | Freq: Two times a day (BID) | ORAL | 3 refills | Status: DC

## 2022-03-03 NOTE — Progress Notes (Signed)
? ?[facility] ?[address] ?[place], [place]  [address] ?Phone:  [phone]   Fax:  [phone] ?Subjective:  ? Patient ID: Altamese DillingRenee C Taite, female    DOB: 02/09/1966, 56 y.o.   MRN: [id number] ? ?Chief Complaint  ?Patient presents with  ? Follow-up  ?  Patient is here today for her 3 month follow up for Hypertension.  ? ?HPI ?Altamese Dillingenee C Torry 56 y.o. female  has a past medical history of Coronary artery disease, History of condyloma acuminatum, History of uterine fibroid, History of vaginal dysplasia, History of vulvar dysplasia, HIV infection ([facility]) ([facility] hatcher), Hypertension, Hypertriglyceridemia without hypercholesterolemia, PONV (postoperative nausea and vomiting), VIN III (vulvar intraepithelial neoplasia III), and Wears glasses. [facility]. ? ?Hypertension: Patient here for follow-up of elevated blood pressure. She is not exercising and is adherent to low salt diet.  Blood pressure is well controlled at home. Cardiac symptoms none. Patient denies none.  Cardiovascular risk factors: hypertension, obesity (BMI >= 30 kg/m2), and sedentary lifestyle. Use of agents associated with hypertension: none. History of target organ damage: none. States that she regularly takes B/P at home and it is similar to today's values. Currently compliant with all medications. ? ?Requesting change in Lyrica due to foul smelling urine. Had similar symptoms when taking gabapentin in the past, questioning if there is an alternative medication that could assist with symptoms.  Denies any other concerns today. ? ?Denies any fatigue, chest pain, shortness of breath, HA or dizziness. Denies any blurred vision, numbness or tingling. ? ?Past Medical History:  ?Diagnosis Date  ? Coronary artery disease   ? History of condyloma acuminatum   ? History of uterine fibroid   ? History of vaginal dysplasia   ? VAIN III--  oncologist-  dr Andrey Farmerrossi  ? History  of vulvar dysplasia   ? VIN III   gyn-oncologist-  dr Andrey Farmerrossi  ? HIV infection ([facility]) [facility] hatcher  ? Hypertension   ? Hypertriglyceridemia without hypercholesterolemia   ? PONV (postoperative nausea and vomiting)   ? VIN III (vulvar intraepithelial neoplasia III)   ? Wears glasses   ? ? ?Past Surgical History:  ?Procedure Laterality Date  ? ABDOMINAL HYSTERECTOMY  1995 approx  ? ANTERIOR CERVICAL DECOMP/DISCECTOMY FUSION  11-30-2009  ? C4 -- C6  ? CO2 LASER APPLICATION Bilateral 07/23/2015  ? Procedure: BILATERAL CO2 LASER ABLATION OF THE VULVA;  Surgeon: Adolphus BirchwoodEmma Rossi, MD;  [facility] [place];  Service: Gynecology;  Laterality: Bilateral;  ? CO2 LASER APPLICATION N/A 02/25/2016  ? Procedure: CO2 LASER OF THE VULVAR;  Surgeon: Adolphus BirchwoodEmma Rossi, MD;  [facility] [place];  Service: Gynecology;  Laterality: N/A;  ? CO2 LASER APPLICATION N/A 05/04/2017  ? Procedure: CO2 LASER VAPORIZATION OF THE VULVA;  Surgeon: Adolphus Birchwoodossi, Emma, MD;  [facility] [place];  Service: Gynecology;  Laterality: N/A;  ? CO2 LASER APPLICATION N/A 12/27/2018  ? Procedure: CO2 LASER APPLICATION OF VULVA;  Surgeon: Adolphus Birchwoodossi, Emma, MD;  [facility] [place];  Service: Gynecology;  Laterality: N/A;  ? COLONOSCOPY  01-28-2015  ? CORONARY ARTERY BYPASS GRAFT N/A 11/25/2017  ? Procedure: CORONARY ARTERY BYPASS GRAFTING (CABG), ON PUMP, TIMES THREE, USING LEFT INTERNAL MAMMARY ARTERY AND ENDOSCOPICALLY HARVESTED LEFT GREATER SAPHENOUS VEIN;  Surgeon: Purcell Nailswen, Clarence H, MD;  [facility];  Service: Open Heart Surgery;  Laterality: N/A;  ?  I & D LEFT BUTTOCK ABSCESS  04-28-2001  ? LEFT HEART CATH AND CORONARY ANGIOGRAPHY N/A 11/22/2017  ? Procedure: LEFT HEART CATH AND CORONARY ANGIOGRAPHY;  Surgeon: Runell Gess, MD;  [facility];  Service: Cardiovascular;  Laterality: N/A;  ? TEE WITHOUT CARDIOVERSION N/A 11/25/2017  ? Procedure: TRANSESOPHAGEAL  ECHOCARDIOGRAM (TEE);  Surgeon: Purcell Nails, MD;  Location: [facility];  Service: Open Heart Surgery;  Laterality: N/A;  ? VULVECTOMY Right 05/06/2015  ? Procedure: WIDE LOCAL  EXCISION  OF RIGHT VULVA;  Surgeon: Adolphus Birchwood, MD;  Location: [facility];  Service: Gynecology;  Laterality: Right;  ? VULVECTOMY N/A 05/04/2017  ? Procedure: WIDE EXCISION VULVECTOMY;  Surgeon: Adolphus Birchwood, MD;  Location: [facility];  Service: Gynecology;  Laterality: N/A;  ? VULVECTOMY N/A 12/27/2018  ? Procedure: WIDE EXCISION VULVECTOMY;  Surgeon: Adolphus Birchwood, MD;  Location: [facility];  Service: Gynecology;  Laterality: N/A;  ? ? ?Family History  ?Problem Relation Age of Onset  ? Cataracts Mother   ? Hypertension Father   ? CAD Paternal Grandfather   ? Colon cancer Neg Hx   ? ? ?Social History  ? ?Socioeconomic History  ? Marital status: Married  ?  Spouse name: Not on file  ? Number of children: Not on file  ? Years of education: Not on file  ? Highest education level: Not on file  ?Occupational History  ? Not on file  ?Tobacco Use  ? Smoking status: Never  ? Smokeless tobacco: Never  ?Vaping Use  ? Vaping Use: Never used  ?Substance and Sexual Activity  ? Alcohol use: No  ?  Alcohol/week: 0.0 standard drinks  ? Drug use: No  ? Sexual activity: Yes  ?  Partners: Male  ?  Birth control/protection: Condom  ?  Comment: pt. given condoms  ?Other Topics Concern  ? Not on file  ?Social History Narrative  ? Not on file  ? ?Social Determinants of Health  ? ?Financial Resource Strain: Not on file  ?Food Insecurity: Not on file  ?Transportation Needs: Not on file  ?Physical Activity: Not on file  ?Stress: Not on file  ?Social Connections: Not on file  ?Intimate Partner Violence: Not on file  ? ? ?Outpatient Medications Prior to Visit  ?Medication Sig Dispense Refill  ? aspirin EC 81 MG tablet Take 81 mg by mouth daily. Swallow whole.    ? furosemide (LASIX) 40 MG tablet Take one tablet by mouth daily  90 tablet 3  ? gemfibrozil (LOPID) 600 MG tablet Take 1 tablet (600 mg total) by mouth 2 (two) times daily before a meal. 180 tablet 3  ? GENVOYA 150-150-200-10 MG TABS tablet TAKE 1 TABLET BY MOUTH DAILY WITH BREAKFAST 30 tablet 5  ? hydrOXYzine (ATARAX/VISTARIL) 10 MG tablet Take 1 tablet (10 mg total) by mouth 3 (three) times daily as needed. 90 tablet 1  ? metoprolol tartrate (LOPRESSOR) 25 MG tablet TAKE 1 AND 1/2 TABLETS(37.5 MG) BY MOUTH TWICE DAILY 270 tablet 3  ? pregabalin (LYRICA) 100 MG capsule Take 1 capsule (100 mg total) by mouth 2 (two) times daily. 60 capsule 2  ? PREZISTA 800 MG tablet TAKE 1 TABLET(800 MG) BY MOUTH DAILY WITH BREAKFAST 30 tablet 5  ? triamcinolone (KENALOG) 0.1 % Apply 1 application topically 2 (two) times daily. (Patient not taking: Reported on 09/10/2021) 45 each 6  ? ?No facility-administered medications prior to visit.  ? ? ?No Known  Allergies ? ?Review of Systems  ?Constitutional:  Negative for chills, fever and malaise/fatigue.  ?Respiratory:  Negative for cough and shortness of breath.   ?Cardiovascular:  Negative for chest pain, palpitations and leg swelling.  ?Gastrointestinal:  Negative for abdominal pain, blood in stool, constipation, diarrhea, nausea and vomiting.  ?Musculoskeletal: Negative.   ?Skin: Negative.   ?Neurological: Negative.   ?Psychiatric/Behavioral:  Negative for depression. The patient is not nervous/anxious.   ?All other systems reviewed and are negative. ? ?   ?Objective:  ?  ?Physical Exam ?Constitutional:   ?   General: She is not in acute distress. ?   Appearance: Normal appearance. She is obese.  ?HENT:  ?   Head: Normocephalic.  ?   Right Ear: Tympanic membrane, ear canal and external ear normal. There is no impacted cerumen.  ?   Left Ear: Tympanic membrane, ear canal and external ear normal. There is no impacted cerumen.  ?Cardiovascular:  ?   Rate and Rhythm: Normal rate and regular rhythm.  ?   Pulses: Normal pulses.  ?   Heart sounds: Normal  heart sounds.  ?   Comments: No obvious peripheral edema ?Pulmonary:  ?   Effort: Pulmonary effort is normal.  ?   Breath sounds: Normal breath sounds.  ?Musculoskeletal:     ?   General: No swelling, t

## 2022-03-03 NOTE — Patient Instructions (Signed)
You were seen today in the [facility] for reevaluation of hypertension. Labs were collected, results will be available via MyChart or, if abnormal, you will be contacted by clinic staff. You were prescribed medications, please take as directed. Please follow up in 3 mths for reevaluation of DM2 and A1C. ?

## 2022-03-10 ENCOUNTER — Telehealth: Payer: Self-pay

## 2022-03-10 NOTE — Telephone Encounter (Signed)
Pt called and said that her Metformin is making her have Diarrhea and is very nausea ? ?She ask for you to call her back! ? ?

## 2022-04-07 ENCOUNTER — Encounter: Payer: Self-pay | Admitting: Infectious Diseases

## 2022-04-07 ENCOUNTER — Other Ambulatory Visit: Payer: Self-pay

## 2022-04-07 ENCOUNTER — Ambulatory Visit (INDEPENDENT_AMBULATORY_CARE_PROVIDER_SITE_OTHER): Payer: Medicare PPO | Admitting: Infectious Diseases

## 2022-04-07 VITALS — BP 115/76 | HR 58 | Temp 98.5°F | Ht 61.0 in | Wt 155.0 lb

## 2022-04-07 DIAGNOSIS — Z23 Encounter for immunization: Secondary | ICD-10-CM | POA: Diagnosis not present

## 2022-04-07 DIAGNOSIS — F4321 Adjustment disorder with depressed mood: Secondary | ICD-10-CM | POA: Insufficient documentation

## 2022-04-07 DIAGNOSIS — D071 Carcinoma in situ of vulva: Secondary | ICD-10-CM | POA: Diagnosis not present

## 2022-04-07 DIAGNOSIS — B20 Human immunodeficiency virus [HIV] disease: Secondary | ICD-10-CM | POA: Diagnosis not present

## 2022-04-07 DIAGNOSIS — Z0000 Encounter for general adult medical examination without abnormal findings: Secondary | ICD-10-CM

## 2022-04-07 DIAGNOSIS — F4320 Adjustment disorder, unspecified: Secondary | ICD-10-CM | POA: Insufficient documentation

## 2022-04-07 MED ORDER — GENVOYA 150-150-200-10 MG PO TABS
1.0000 | ORAL_TABLET | Freq: Every day | ORAL | 11 refills | Status: DC
Start: 2022-04-07 — End: 2023-04-13

## 2022-04-07 MED ORDER — DARUNAVIR 800 MG PO TABS: ORAL_TABLET | ORAL | 11 refills | Status: DC

## 2022-04-07 NOTE — Assessment & Plan Note (Signed)
Prevnar 20 given today to complete pneumococcal vaccine.  Pap screening due with GYN

## 2022-04-07 NOTE — Patient Instructions (Addendum)
Please keep up with the Metformin - the stomach side effects typically get better with time.   Please continue your Genvoya and Prezista   [facility] - Claris Gower is lovely. But I think you will like Tonya when you meet her.   Would like to see you back in 6 months to check in   We gave you your pneumonia booster today (last one!)

## 2022-04-07 NOTE — Assessment & Plan Note (Addendum)
Very well controlled on Genvoya + Prezista taken together daily with food. No drug interactions noted. Will update pertinent labs today and have her back in 6 months to review meds and see how she is doing.  She is not on a statin for some reason (history of CABG and now with diabetes) but lipids well maintained on gemfibrozil with last LDL < 70.  Will discuss recommendations from REPRIEVE study at return visit to see if she was previously intolerant of statin treatment or can re-trial. Prefer higher intensity given secondary prevention and co-morbidities.

## 2022-04-07 NOTE — Progress Notes (Signed)
Name: Cathy Smith  DOB: 1966-02-09 MRN: [id number] PCP: Teena Dunk, NP    Brief Narrative:  Cathy Smith  is a 56 y.o. female with well controlled HIV disease, Dx 2005 per chart review. CD4 nadir 60 VL 12,300  HIV Risk: HIV+ husband, heterosexual History of OIs: vulvar cancer  Intake Labs 2021: Hep B sAg (-), sAb (-), cAb (-); Hep A (not done), Hep C (not done) Quantiferon () HLA B*5701 (-) G6PD: ()   Previous Regimens: Genvoya + Prezista   Genotypes: 2008 - P14S, V35I, S68G, R83K, V106I,  I178M, G196E, [id number], T200I, L214F, E297K, D324E;  V3I, T4S, S37N, L63P  2009 - M184V  2010 - V106I   Cumulative Genotype:  RTI Resistance Mutations: M184V NNRTI Resistance Mutations: V106I  Nucleoside Reverse Transcriptase Inhibitors abacavir (ABC) Low-Level Resistance zidovudine (AZT) Susceptible emtricitabine (FTC) High-Level Resistance lamivudine (3TC) High-Level Resistance tenofovir (TDF) Susceptible  Non-nucleoside Reverse Transcriptase Inhibitors doravirine (DOR) Potential Low-Level Resistance efavirenz (EFV) Susceptible etravirine (ETR) Potential Low-Level Resistance nevirapine (NVP) Potential Low-Level Resistance rilpivirine (RPV) Potential Low-Level Resistance  PI Major Resistance Mutations: None PI Accessory Resistance Mutations: None  Protease Inhibitors atazanavir/r (ATV/r) Susceptible darunavir/r (DRV/r) Susceptible lopinavir/r (LPV/r) Susceptible   Subjective:   Chief Complaint  Patient presents with   Follow-up     HPI: Cathy Smith is here for routine follow up care for HIV. She has had long-standing perfectly controlled HIV on Genvoya + Prezista with last VL < 20 and CD4 411. She has not missed any doses of her medication and has no problems with it.   Recently diagnosed with diabetes and started on Metformin BID. Was having a lot of side effects from it. Felt very nauseated with it and is having trouble taking it.   Lost her father about 1  month ago - she still feels very sad over this and cries a lot. Has a lot of support with her husband to help with the grief. We spent time discussing this today.   She has a history of vulvovaginal dysplasia (VIN III). FU with GYN 11/22. Recommended FU in 14m(May 2022).   FU with cardiology s/p CABG x 3 in 11/2017 - Follows with Dr. BGwenlyn Foundand doing well. BP under good control and FU in 181yrLipid panel January 2022 with LDL 68, total 156, TG 304.    Review of Systems  Constitutional:  Negative for chills and fever.  HENT:  Negative for tinnitus.   Eyes:  Negative for blurred vision and photophobia.  Respiratory:  Negative for cough and sputum production.   Cardiovascular:  Negative for chest pain.  Gastrointestinal:  Negative for diarrhea, nausea and vomiting.  Genitourinary:  Negative for dysuria.  Skin:  Negative for rash.  Neurological:  Negative for headaches.     Past Medical History:  Diagnosis Date   Coronary artery disease    History of condyloma acuminatum    History of uterine fibroid    History of vaginal dysplasia    VAIN III--  oncologist-  dr roDenman George History of vulvar dysplasia    VIN III   gyn-oncologist-  dr roDenman George [facility]   Hypertension    Hypertriglyceridemia without hypercholesterolemia    PONV (postoperative nausea and vomiting)    VIN III (vulvar intraepithelial neoplasia III)    Wears glasses     Outpatient Medications Prior to Visit  Medication Sig Dispense Refill   aspirin  EC 81 MG tablet Take 81 mg by mouth daily. Swallow whole.     furosemide (LASIX) 40 MG tablet Take one tablet by mouth daily 90 tablet 3   metFORMIN (GLUCOPHAGE) 500 MG tablet Take 1 tablet (500 mg total) by mouth 2 (two) times daily with a meal. 180 tablet 3   GENVOYA 150-150-200-10 MG TABS tablet TAKE 1 TABLET BY MOUTH DAILY WITH BREAKFAST 30 tablet 5   PREZISTA 800 MG tablet TAKE 1 TABLET(800 MG) BY MOUTH DAILY  WITH BREAKFAST 30 tablet 5   gemfibrozil (LOPID) 600 MG tablet Take 1 tablet (600 mg total) by mouth 2 (two) times daily before a meal. 180 tablet 3   hydrOXYzine (ATARAX/VISTARIL) 10 MG tablet Take 1 tablet (10 mg total) by mouth 3 (three) times daily as needed. (Patient not taking: Reported on 04/07/2022) 90 tablet 1   metoprolol tartrate (LOPRESSOR) 25 MG tablet TAKE 1 AND 1/2 TABLETS(37.5 MG) BY MOUTH TWICE DAILY 270 tablet 3   pregabalin (LYRICA) 100 MG capsule Take 1 capsule (100 mg total) by mouth 2 (two) times daily. 60 capsule 2   No facility-administered medications prior to visit.     No Known Allergies  Social History   Tobacco Use   Smoking status: Never   Smokeless tobacco: Never  Vaping Use   Vaping Use: Never used  Substance Use Topics   Alcohol use: No    Alcohol/week: 0.0 standard drinks   Drug use: No    Family History  Problem Relation Age of Onset   Cataracts Mother    Hypertension Father    CAD Paternal Grandfather    Colon cancer Neg Hx     Social History   Substance and Sexual Activity  Sexual Activity Yes   Partners: Male   Birth control/protection: Condom   Comment: accepted condoms     Objective:   Vitals:   04/07/22 0951  BP: 115/76  Pulse: (!) 58  Temp: 98.5 F (36.9 C)  TempSrc: Oral  SpO2: 98%  Weight: 155 lb (70.3 kg)  Height: '5\' 1"'  (1.549 m)   Body mass index is 29.29 kg/m.   Physical Exam Constitutional:      Appearance: Normal appearance. She is not ill-appearing.  HENT:     Mouth/Throat:     Mouth: Mucous membranes are moist.     Pharynx: Oropharynx is clear.  Eyes:     General: No scleral icterus. Pulmonary:     Effort: Pulmonary effort is normal.  Neurological:     Mental Status: She is oriented to person, place, and time.  Psychiatric:        Mood and Affect: Mood normal.        Thought Content: Thought content normal.    Lab Results Lab Results  Component Value Date   WBC 3.7 (L) 08/04/2021   HGB  12.6 08/04/2021   HCT 37.8 08/04/2021   MCV 97.4 08/04/2021   PLT 212 08/04/2021    Lab Results  Component Value Date   CREATININE 0.93 01/02/2022   BUN 14 01/02/2022   NA 139 01/02/2022   K 3.9 01/02/2022   CL 98 01/02/2022   CO2 28 08/04/2021    Lab Results  Component Value Date   ALT 20 08/04/2021   AST 12 01/02/2022   ALKPHOS 87 01/02/2022   BILITOT 0.4 01/02/2022    Lab Results  Component Value Date   CHOL 219 (H) 01/02/2022   HDL 51 01/02/2022   LDLCALC 149 (H)  01/02/2022   TRIG 105 01/02/2022   CHOLHDL 4.3 01/02/2022   HIV 1 RNA Quant  Date Value  08/04/2021 Not Detected Copies/mL  08/12/2020 <20 Copies/mL  02/19/2020 <20 NOT DETECTED copies/mL   CD4 T Cell Abs (/uL)  Date Value  08/04/2021 378 (L)  08/12/2020 448  02/19/2020 411     Assessment & Plan:   Problem List Items Addressed This Visit       Unprioritized   Human immunodeficiency virus (HIV) disease ([place]) (Chronic)    Very well controlled on Genvoya + Prezista taken together daily with food. No drug interactions noted. Will update pertinent labs today and have her back in 6 months to review meds and see how she is doing.  She is not on a statin for some reason (history of CABG and now with diabetes) but lipids well maintained on gemfibrozil with last LDL < 70.  Will discuss recommendations from [place] study at return visit to see if she was previously intolerant of statin treatment or can re-trial. Prefer higher intensity given secondary prevention and co-morbidities.        Relevant Medications   elvitegravir-cobicistat-emtricitabine-tenofovir (GENVOYA) 150-150-200-10 MG TABS tablet   darunavir (PREZISTA) 800 MG tablet   Other Relevant Orders   T-helper cells (CD4) count (not at [facility])   HIV 1 RNA quant-no reflex-bld   Pneumococcal conjugate vaccine 20-valent (Completed)   Grief reaction    Spent most of the time discussing recent loss of her father. Allowed space for active/therapeutic  listening. I think her reaction and grief are all in the contexted of expected acute grief. We discussed options going forward with counseling if she finds that she has a hard time getting past the acute grief and it begins to interfere with daily activities.        VIN III (vulvar intraepithelial neoplasia III)    Reminded her of requested follow up with GYN in May/June 2023. She will call to arrange.        Other Visit Diagnoses     Need for pneumococcal vaccination    -  Primary   Relevant Orders   Pneumococcal conjugate vaccine 20-valent (Completed)      Total Encounter Time: 30 minutes, much of which was spent in direct clinic visit and discussion with Ms. Scorsone.    Janene Madeira, MSN, NP-C [facility] [place] Pager: [phone] Office: [phone]  04/07/22  3:00 PM

## 2022-04-07 NOTE — Assessment & Plan Note (Signed)
Spent most of the time discussing recent loss of her father. Allowed space for active/therapeutic listening. I think her reaction and grief are all in the contexted of expected acute grief. We discussed options going forward with counseling if she finds that she has a hard time getting past the acute grief and it begins to interfere with daily activities.

## 2022-04-07 NOTE — Assessment & Plan Note (Signed)
Reminded her of requested follow up with GYN in May/June 2023. She will call to arrange.

## 2022-04-09 LAB — T-HELPER CELLS (CD4) COUNT (NOT AT ARMC)
CD4 % Helper T Cell: 26 % — ABNORMAL LOW (ref 33–65)
CD4 T Cell Abs: 493 /uL (ref 400–1790)

## 2022-04-13 DIAGNOSIS — H524 Presbyopia: Secondary | ICD-10-CM | POA: Diagnosis not present

## 2022-04-13 DIAGNOSIS — H5203 Hypermetropia, bilateral: Secondary | ICD-10-CM | POA: Diagnosis not present

## 2022-04-13 DIAGNOSIS — H52209 Unspecified astigmatism, unspecified eye: Secondary | ICD-10-CM | POA: Diagnosis not present

## 2022-04-13 LAB — HIV-1 RNA QUANT-NO REFLEX-BLD

## 2022-04-16 ENCOUNTER — Other Ambulatory Visit: Payer: Self-pay

## 2022-04-16 ENCOUNTER — Telehealth: Payer: Self-pay

## 2022-04-16 DIAGNOSIS — B20 Human immunodeficiency virus [HIV] disease: Secondary | ICD-10-CM

## 2022-04-16 NOTE — Telephone Encounter (Signed)
Spoke with patient, relayed that she will need to come back to have viral load recollected. States she will call back later to schedule when she knows more about her schedule.   Sandie Ano, RN

## 2022-04-16 NOTE — Telephone Encounter (Signed)
-----   Message from Blanchard Kelch, NP sent at 04/16/2022 10:19 AM EDT ----- Will need Cathy Smith to come back at her convenience to repeat her viral load as they did not get enough blood to run it unfortunately.

## 2022-04-20 ENCOUNTER — Encounter: Payer: Self-pay | Admitting: Infectious Diseases

## 2022-04-27 ENCOUNTER — Other Ambulatory Visit: Payer: Self-pay

## 2022-04-27 DIAGNOSIS — I10 Essential (primary) hypertension: Secondary | ICD-10-CM

## 2022-04-27 MED ORDER — FUROSEMIDE 40 MG PO TABS: ORAL_TABLET | ORAL | 3 refills | Status: DC

## 2022-05-08 ENCOUNTER — Telehealth: Payer: Self-pay

## 2022-05-08 NOTE — Telephone Encounter (Signed)
Pt is asking for a nurse to call her!!

## 2022-05-14 NOTE — Telephone Encounter (Signed)
Pt wanted to confirm if she should stop taking Metformin. Last seen 03/03/22 and refill provided, 180 tabs bid for 90 days with 2 refills. She mentioned she is no longer having side effects of diarrhea or nausea. Advised she should continue taking as prescribed. Confirmed refills available at the pharmacy. Nothing further needed at this time.

## 2022-05-20 ENCOUNTER — Other Ambulatory Visit: Payer: Self-pay

## 2022-05-20 ENCOUNTER — Ambulatory Visit: Payer: Medicare HMO

## 2022-05-26 ENCOUNTER — Telehealth: Payer: Self-pay | Admitting: Nurse Practitioner

## 2022-05-26 NOTE — Telephone Encounter (Signed)
Pt called requesting her provider to call her back because she is wondering if she still needs to take her metformin. Her pcp was Enid Derry and she has not seen anyone else in office since  Call back at [phone]

## 2022-05-27 ENCOUNTER — Telehealth: Payer: Self-pay | Admitting: Family Medicine

## 2022-05-27 NOTE — Telephone Encounter (Signed)
Please schedule appointment.  Thanks

## 2022-05-27 NOTE — Telephone Encounter (Signed)
Cathy Smith is a 56 year old female that notify clinic with concerns pertaining to her current medication regimen.  Patient has a history of type 2 diabetes mellitus.  She was evaluated in clinic 2 months prior and hemoglobin A1c was 6.8.  Patient is inquiring whether she should continue metformin.  She was not advised by her current PCP to discontinue this medication. Discussed this medication at length with patient.  Patient is concerned whether she should continue taking this medication because she has been following a low carbohydrate diet and has become more active.  Patient advised to continue metformin 500 mg twice daily.  Also, it is important that she continues to remain active and follow her carbohydrate modified diet.  Patient will follow-up in 3 months as scheduled.   Nolon Nations  APRN, MSN, FNP-C Patient Care [facility] [address] [place], [place] [address] [phone]

## 2022-06-02 ENCOUNTER — Encounter: Payer: Self-pay | Admitting: Infectious Diseases

## 2022-07-10 ENCOUNTER — Telehealth: Payer: Self-pay

## 2022-07-10 NOTE — Telephone Encounter (Signed)
Error

## 2022-07-10 NOTE — Telephone Encounter (Signed)
Pt is asking if she can be prescribed something else than metformin it's making her sick   Please call pt

## 2022-07-10 NOTE — Telephone Encounter (Signed)
Will need appointment at my first available. Thanks.

## 2022-07-24 ENCOUNTER — Other Ambulatory Visit: Payer: Self-pay | Admitting: Cardiovascular Disease

## 2022-08-26 ENCOUNTER — Ambulatory Visit (INDEPENDENT_AMBULATORY_CARE_PROVIDER_SITE_OTHER): Payer: Medicare HMO

## 2022-08-26 ENCOUNTER — Other Ambulatory Visit: Payer: Self-pay

## 2022-08-26 DIAGNOSIS — Z23 Encounter for immunization: Secondary | ICD-10-CM

## 2022-09-01 ENCOUNTER — Other Ambulatory Visit: Payer: Self-pay | Admitting: Pharmacist

## 2022-09-01 NOTE — Progress Notes (Signed)
This patient is appearing on the insurance-provided list for being at risk of failing the adherence measure for Statin Therapy for Patients with Cardiovascular Disease (SPC) medications this calendar year.   Patient has CAD s/p CABG in 2019. She should be on high intensity statin therapy. Appears per chart review that after elevated triglycerides were seen in lab work 11/2020, prior PCP discontinued atorvastatin to start gemfibrozil. Patient should remain on high intensity statin therapy indefinitely for secondary prevention.   Contacted patient. She does not remember any intolerability with atorvastatin previously.   Reviewed drug interactions with Prezista and Genvoya. Max dose of rosuvastatin would be 20 mg daily with darunavir+cobicistat, while max dose of atorvastatin would be 20 mg.    Recommend to discontinue gemfibrozil. Recommend to conservatively start rosuvastatin at 10 mg daily and follow for tolerability. Recommend CMP and lipids in ~6-8 weeks. Will route to PCP to discuss tomorrow.   [name], PharmD, [place], [facility] [phone]

## 2022-09-02 ENCOUNTER — Encounter: Payer: Self-pay | Admitting: Nurse Practitioner

## 2022-09-02 ENCOUNTER — Ambulatory Visit (INDEPENDENT_AMBULATORY_CARE_PROVIDER_SITE_OTHER): Payer: Medicare HMO | Admitting: Nurse Practitioner

## 2022-09-02 VITALS — BP 103/67 | HR 64 | Ht 61.0 in | Wt 142.0 lb

## 2022-09-02 DIAGNOSIS — I10 Essential (primary) hypertension: Secondary | ICD-10-CM | POA: Diagnosis not present

## 2022-09-02 DIAGNOSIS — E119 Type 2 diabetes mellitus without complications: Secondary | ICD-10-CM | POA: Diagnosis not present

## 2022-09-02 DIAGNOSIS — E785 Hyperlipidemia, unspecified: Secondary | ICD-10-CM

## 2022-09-02 HISTORY — DX: Type 2 diabetes mellitus without complications: E11.9

## 2022-09-02 LAB — POCT GLYCOSYLATED HEMOGLOBIN (HGB A1C): HbA1c, POC (controlled diabetic range): 5.1 % (ref 0.0–7.0)

## 2022-09-02 MED ORDER — FUROSEMIDE 40 MG PO TABS: ORAL_TABLET | ORAL | 3 refills | Status: DC

## 2022-09-02 MED ORDER — METFORMIN HCL ER 500 MG PO TB24: 500.0000 mg | ORAL_TABLET | Freq: Every day | ORAL | 2 refills | Status: DC

## 2022-09-02 MED ORDER — GEMFIBROZIL 600 MG PO TABS: 600.0000 mg | ORAL_TABLET | Freq: Two times a day (BID) | ORAL | 3 refills | Status: DC

## 2022-09-02 NOTE — Progress Notes (Signed)
@Patient  ID: Cathy Smith, female    DOB: [birth year], 56 y.o.   MRN: [id number]  Chief Complaint  Patient presents with   Follow-up    Referring provider: Teena Dunk, NP   HPI  Cathy Smith 56 y.o. female  has a past medical history of Coronary artery disease, History of condyloma acuminatum, History of uterine fibroid, History of vaginal dysplasia, History of vulvar dysplasia, HIV infection ([place]) ([facility] hat[facility]), Hypertension, Hypertriglyceridemia without hypercholesterolemia, PONV (postoperative nausea and vomiting), VIN III (vulvar intraepithelial neoplasia III), and Wears glasses. To the [facility] for reevaluation of hypertension.   Hypertension: Patient here for follow-up of elevated blood pressure. She is not exercising and is adherent to low salt diet.  Blood pressure is well controlled at home. Cardiac symptoms none. Patient denies none.  Cardiovascular risk factors: hypertension, obesity (BMI >= 30 kg/m2), and sedentary lifestyle. Use of agents associated with hypertension: none. History of target organ damage: none. States that she regularly takes B/P at home and it is similar to today's values. Currently compliant with all medications.  Diabetes: Presents for follow up on diabetes. Currently on metformin for diabetes - working on diabetic diet.  We will refer patient to diabetic nutritionist.  A1c in office today was good. Denies f/c/s, n/v/d, hemoptysis, PND, leg swelling Denies chest pain or edema    No Known Allergies  Immunization History  Administered Date(s) Administered   COVID-19, mRNA, vaccine(Comirnaty)12 years and older 08/26/2022   H1N1 10/18/2008   Hepatitis B 01/13/2001, 02/02/2001, 03/02/2001, 11/16/2001   Hepatitis B, adult 02/05/2014, 08/08/2014, 02/11/2015   Influenza Split 09/02/2011, 12/20/2012   Influenza Whole 08/30/2006, 08/03/2007, 09/24/2008, 08/07/2009, 09/11/2010   Influenza,inj,Quad PF,6+ Mos 08/02/2013,  08/08/2014, 08/27/2015, 08/19/2016, 09/20/2017, 08/15/2018, 08/16/2019, 08/12/2020, 08/04/2021, 08/26/2022   Meningococcal Mcv4o 04/21/2017   PFIZER(Purple Top)SARS-COV-2 Vaccination 01/25/2020, 02/19/2020, 08/30/2020   PNEUMOCOCCAL CONJUGATE-20 04/07/2022   Pfizer Covid-19 Vaccine Bivalent Booster 38yrs & up 08/04/2021   Pneumococcal Conjugate-13 01/13/2016   Pneumococcal Polysaccharide-23 08/30/2006, 12/31/2010   Tdap 09/06/2014    Past Medical History:  Diagnosis Date   Coronary artery disease    History of condyloma acuminatum    History of uterine fibroid    History of vaginal dysplasia    VAIN III--  oncologist-  dr Denman George   History of vulvar dysplasia    VIN III   gyn-oncologist-  dr Denman George   HIV infection [facility]) montiored by [facility]-  dr hatcher   Hypertension    Hypertriglyceridemia without hypercholesterolemia    PONV (postoperative nausea and vomiting)    Type 2 diabetes mellitus without complication, without long-term current use of insulin ([place]) 09/02/2022   VIN III (vulvar intraepithelial neoplasia III)    Wears glasses     Tobacco History: Social History   Tobacco Use  Smoking Status Never  Smokeless Tobacco Never   Counseling given: Not Answered   Outpatient Encounter Medications as of 09/02/2022  Medication Sig   aspirin EC 81 MG tablet Take 81 mg by mouth daily. Swallow whole.   darunavir (PREZISTA) 800 MG tablet TAKE 1 TABLET(800 MG) BY MOUTH DAILY WITH BREAKFAST   elvitegravir-cobicistat-emtricitabine-tenofovir (GENVOYA) 150-150-200-10 MG TABS tablet Take 1 tablet by mouth daily with breakfast.   hydrOXYzine (ATARAX/VISTARIL) 10 MG tablet Take 1 tablet (10 mg total) by mouth 3 (three) times daily as needed.   metFORMIN (GLUCOPHAGE-XR) 500 MG 24 hr tablet Take 1 tablet (500 mg total) by mouth daily with breakfast.  metoprolol tartrate (LOPRESSOR) 25 MG tablet TAKE 1 AND 1/2 TABLETS(37.5 MG) BY MOUTH TWICE DAILY   [DISCONTINUED]  furosemide (LASIX) 40 MG tablet Take one tablet by mouth daily   [DISCONTINUED] metFORMIN (GLUCOPHAGE) 500 MG tablet Take 1 tablet (500 mg total) by mouth 2 (two) times daily with a meal.   furosemide (LASIX) 40 MG tablet Take one tablet by mouth daily   gemfibrozil (LOPID) 600 MG tablet Take 1 tablet (600 mg total) by mouth 2 (two) times daily before a meal.   pregabalin (LYRICA) 100 MG capsule Take 1 capsule (100 mg total) by mouth 2 (two) times daily.   [DISCONTINUED] atorvastatin (LIPITOR) 20 MG tablet TAKE 1 TABLET(20 MG) BY MOUTH DAILY AT 6 PM   [DISCONTINUED] gemfibrozil (LOPID) 600 MG tablet Take 1 tablet (600 mg total) by mouth 2 (two) times daily before a meal.   No facility-administered encounter medications on file as of 09/02/2022.     Review of Systems  Review of Systems  Constitutional: Negative.   HENT: Negative.    Cardiovascular: Negative.   Gastrointestinal: Negative.   Allergic/Immunologic: Negative.   Neurological: Negative.   Psychiatric/Behavioral: Negative.         Physical Exam  BP 103/67   Pulse 64   Ht 5\' 1"  (1.549 m)   Wt 142 lb (64.4 kg)   SpO2 100%   BMI 26.83 kg/m   Wt Readings from Last 5 Encounters:  09/02/22 142 lb (64.4 kg)  04/07/22 155 lb (70.3 kg)  03/03/22 160 lb 12.8 oz (72.9 kg)  01/02/22 158 lb (71.7 kg)  11/14/21 158 lb (71.7 kg)     Physical Exam Vitals and nursing note reviewed.  Constitutional:      General: She is not in acute distress.    Appearance: She is well-developed.  Cardiovascular:     Rate and Rhythm: Normal rate and regular rhythm.  Pulmonary:     Effort: Pulmonary effort is normal.     Breath sounds: Normal breath sounds.  Neurological:     Mental Status: She is alert and oriented to person, place, and time.        Assessment & Plan:   Type 2 diabetes mellitus without complication, without long-term current use of insulin (HCC) - POCT glycosylated hemoglobin (Hb A1C) - CBC with  Differential - Comprehensive metabolic panel  - may try diabetic recipes from [email or web address]  Lab Results  Component Value Date   HGBA1C 5.1 09/02/2022     Follow up:  Follow up in 3 months or sooner if needed     09/04/2022, NP 09/02/2022

## 2022-09-02 NOTE — Patient Instructions (Addendum)
1. Type 2 diabetes mellitus without complication, without long-term current use of insulin (HCC)  - POCT glycosylated hemoglobin (Hb A1C) - CBC with Differential - Comprehensive metabolic panel  - may try diabetic recipes from www.eatingwell.com  Lab Results  Component Value Date   HGBA1C 5.1 09/02/2022     Follow up:  Follow up in 3 months or sooner if needed   Diabetes Mellitus and Nutrition, Adult When you have diabetes, or diabetes mellitus, it is very important to have healthy eating habits because your blood sugar (glucose) levels are greatly affected by what you eat and drink. Eating healthy foods in the right amounts, at about the same times every day, can help you: Manage your blood glucose. Lower your risk of heart disease. Improve your blood pressure. Reach or maintain a healthy weight. What can affect my meal plan? Every person with diabetes is different, and each person has different needs for a meal plan. Your health care provider may recommend that you work with a dietitian to make a meal plan that is best for you. Your meal plan may vary depending on factors such as: The calories you need. The medicines you take. Your weight. Your blood glucose, blood pressure, and cholesterol levels. Your activity level. Other health conditions you have, such as heart or kidney disease. How do carbohydrates affect me? Carbohydrates, also called carbs, affect your blood glucose level more than any other type of food. Eating carbs raises the amount of glucose in your blood. It is important to know how many carbs you can safely have in each meal. This is different for every person. Your dietitian can help you calculate how many carbs you should have at each meal and for each snack. How does alcohol affect me? Alcohol can cause a decrease in blood glucose (hypoglycemia), especially if you use insulin or take certain diabetes medicines by mouth. Hypoglycemia can be a life-threatening  condition. Symptoms of hypoglycemia, such as sleepiness, dizziness, and confusion, are similar to symptoms of having too much alcohol. Do not drink alcohol if: Your health care provider tells you not to drink. You are pregnant, may be pregnant, or are planning to become pregnant. If you drink alcohol: Limit how much you have to: 0-1 drink a day for women. 0-2 drinks a day for men. Know how much alcohol is in your drink. In the [place] one drink equals one 12 oz bottle of beer (355 mL), one 5 oz glass of wine (148 mL), or one 1 oz glass of hard liquor (44 mL). Keep yourself hydrated with water, diet soda, or unsweetened iced tea. Keep in mind that regular soda, juice, and other mixers may contain a lot of sugar and must be counted as carbs. What are tips for following this plan?  Reading food labels Start by checking the serving size on the Nutrition Facts label of packaged foods and drinks. The number of calories and the amount of carbs, fats, and other nutrients listed on the label are based on one serving of the item. Many items contain more than one serving per package. Check the total grams (g) of carbs in one serving. Check the number of grams of saturated fats and trans fats in one serving. Choose foods that have a low amount or none of these fats. Check the number of milligrams (mg) of salt (sodium) in one serving. Most people should limit total sodium intake to less than 2,300 mg per day. Always check the nutrition information of foods labeled  as "low-fat" or "nonfat." These foods may be higher in added sugar or refined carbs and should be avoided. Talk to your dietitian to identify your daily goals for nutrients listed on the label. Shopping Avoid buying canned, pre-made, or processed foods. These foods tend to be high in fat, sodium, and added sugar. Shop around the outside edge of the grocery store. This is where you will most often find fresh fruits and vegetables, bulk grains, fresh  meats, and fresh dairy products. Cooking Use low-heat cooking methods, such as baking, instead of high-heat cooking methods, such as deep frying. Cook using healthy oils, such as olive, canola, or sunflower oil. Avoid cooking with butter, cream, or high-fat meats. Meal planning Eat meals and snacks regularly, preferably at the same times every day. Avoid going long periods of time without eating. Eat foods that are high in fiber, such as fresh fruits, vegetables, beans, and whole grains. Eat 4-6 oz (112-168 g) of lean protein each day, such as lean meat, chicken, fish, eggs, or tofu. One ounce (oz) (28 g) of lean protein is equal to: 1 oz (28 g) of meat, chicken, or fish. 1 egg.  cup (62 g) of tofu. Eat some foods each day that contain healthy fats, such as avocado, nuts, seeds, and fish. What foods should I eat? Fruits Berries. Apples. Oranges. Peaches. Apricots. Plums. Grapes. Mangoes. Papayas. Pomegranates. Kiwi. Cherries. Vegetables Leafy greens, including lettuce, spinach, kale, chard, collard greens, mustard greens, and cabbage. Beets. Cauliflower. Broccoli. Carrots. Green beans. Tomatoes. Peppers. Onions. Cucumbers. Brussels sprouts. Grains Whole grains, such as whole-wheat or whole-grain bread, crackers, tortillas, cereal, and pasta. Unsweetened oatmeal. Quinoa. Brown or wild rice. Meats and other proteins Seafood. Poultry without skin. Lean cuts of poultry and beef. Tofu. Nuts. Seeds. Dairy Low-fat or fat-free dairy products such as milk, yogurt, and cheese. The items listed above may not be a complete list of foods and beverages you can eat and drink. Contact a dietitian for more information. What foods should I avoid? Fruits Fruits canned with syrup. Vegetables Canned vegetables. Frozen vegetables with butter or cream sauce. Grains Refined white flour and flour products such as bread, pasta, snack foods, and cereals. Avoid all processed foods. Meats and other  proteins Fatty cuts of meat. Poultry with skin. Breaded or fried meats. Processed meat. Avoid saturated fats. Dairy Full-fat yogurt, cheese, or milk. Beverages Sweetened drinks, such as soda or iced tea. The items listed above may not be a complete list of foods and beverages you should avoid. Contact a dietitian for more information. Questions to ask a health care provider Do I need to meet with a certified diabetes care and education specialist? Do I need to meet with a dietitian? What number can I call if I have questions? When are the best times to check my blood glucose? Where to find more information: American Diabetes Association: diabetes.org Academy of Nutrition and Dietetics: eatright.[employer] of Diabetes and Digestive and Kidney Diseases: AmenCredit.is Association of Diabetes Care & Education Specialists: diabeteseducator.org Summary It is important to have healthy eating habits because your blood sugar (glucose) levels are greatly affected by what you eat and drink. It is important to use alcohol carefully. A healthy meal plan will help you manage your blood glucose and lower your risk of heart disease. Your health care provider may recommend that you work with a dietitian to make a meal plan that is best for you. This information is not intended to replace advice given to you  by your health care provider. Make sure you discuss any questions you have with your health care provider. Document Revised: 05/29/2020 Document Reviewed: 05/29/2020 Elsevier Patient Education  2023 [employer].

## 2022-09-02 NOTE — Assessment & Plan Note (Signed)
-   POCT glycosylated hemoglobin (Hb A1C) - CBC with Differential - Comprehensive metabolic panel  - may try diabetic recipes from www.eatingwell.com  Lab Results  Component Value Date   HGBA1C 5.1 09/02/2022     Follow up:  Follow up in 3 months or sooner if needed

## 2022-09-03 ENCOUNTER — Other Ambulatory Visit: Payer: Self-pay | Admitting: Pharmacist

## 2022-09-03 DIAGNOSIS — I10 Essential (primary) hypertension: Secondary | ICD-10-CM

## 2022-09-03 LAB — COMPREHENSIVE METABOLIC PANEL
ALT: 14 IU/L (ref 0–32)
AST: 18 IU/L (ref 0–40)
Albumin/Globulin Ratio: 1.8 (ref 1.2–2.2)
Albumin: 5.1 g/dL — ABNORMAL HIGH (ref 3.8–4.9)
Alkaline Phosphatase: 58 IU/L (ref 44–121)
BUN/Creatinine Ratio: 14 (ref 9–23)
BUN: 16 mg/dL (ref 6–24)
Bilirubin Total: 0.4 mg/dL (ref 0.0–1.2)
CO2: 25 mmol/L (ref 20–29)
Calcium: 9.1 mg/dL (ref 8.7–10.2)
Chloride: 99 mmol/L (ref 96–106)
Creatinine, Ser: 1.16 mg/dL — ABNORMAL HIGH (ref 0.57–1.00)
Globulin, Total: 2.9 g/dL (ref 1.5–4.5)
Glucose: 91 mg/dL (ref 70–99)
Potassium: 3.6 mmol/L (ref 3.5–5.2)
Sodium: 142 mmol/L (ref 134–144)
Total Protein: 8 g/dL (ref 6.0–8.5)
eGFR: 55 mL/min/{1.73_m2} — ABNORMAL LOW (ref >59)

## 2022-09-03 LAB — CBC WITH DIFFERENTIAL/PLATELET
Basophils Absolute: 0 10*3/uL (ref 0.0–0.2)
Basos: 1 %
EOS (ABSOLUTE): 0.1 10*3/uL (ref 0.0–0.4)
Eos: 2 %
Hematocrit: 33.4 % — ABNORMAL LOW (ref 34.0–46.6)
Hemoglobin: 11.4 g/dL (ref 11.1–15.9)
Immature Grans (Abs): 0 10*3/uL (ref 0.0–0.1)
Immature Granulocytes: 0 %
Lymphocytes Absolute: 1.7 10*3/uL (ref 0.7–3.1)
Lymphs: 42 %
MCH: 33 pg (ref 26.6–33.0)
MCHC: 34.1 g/dL (ref 31.5–35.7)
MCV: 97 fL (ref 79–97)
Monocytes Absolute: 0.6 10*3/uL (ref 0.1–0.9)
Monocytes: 14 %
Neutrophils Absolute: 1.6 10*3/uL (ref 1.4–7.0)
Neutrophils: 41 %
Platelets: 246 10*3/uL (ref 150–450)
RBC: 3.45 x10E6/uL — ABNORMAL LOW (ref 3.77–5.28)
RDW: 11.3 % — ABNORMAL LOW (ref 11.7–15.4)
WBC: 4 10*3/uL (ref 3.4–10.8)

## 2022-09-03 MED ORDER — FUROSEMIDE 40 MG PO TABS: ORAL_TABLET | ORAL | 3 refills | Status: DC

## 2022-09-03 MED ORDER — ROSUVASTATIN CALCIUM 10 MG PO TABS: 10.0000 mg | ORAL_TABLET | Freq: Every day | ORAL | 3 refills | Status: DC

## 2022-09-03 NOTE — Patient Instructions (Addendum)
Cathy Smith,   STOP GEMFIBROZIL. Instead, take rosuvastatin 10 mg daily.   Change furosemide - instead of taking every day, just take it as needed if you have swelling in your feet or ankles.   [name], PharmD, [facility] [phone]

## 2022-09-03 NOTE — Addendum Note (Signed)
Addended by: Kaylyn Layer T on: 09/03/2022 09:33 AM   Modules accepted: Orders

## 2022-09-03 NOTE — Progress Notes (Signed)
09/03/2022 Name: RIELLE SCHLAUCH MRN: [id number] DOB: 10-Mar-1966  Chief Complaint  Patient presents with   Medication Management   Hyperlipidemia    MAURINA FAWAZ is a 56 y.o. year old female who presented for a telephone visit.   They were referred to the pharmacist by a quality report for assistance in managing hyperlipidemia.   Subjective:  Care Team: Primary Care Provider: Lazaro Arms; Next Scheduled Visit: 12/02/21  Medication Access/Adherence  Current Pharmacy:  [facility] [place], [place] [address] Phone: [phone] Fax: [phone]  [facility], [place] [address] Phone: [phone] Fax: [phone]   Patient reports affordability concerns with their medications: No  Patient reports access/transportation concerns to their pharmacy: No  Patient reports adherence concerns with their medications:  No     Diabetes:  Current medications: metformin XR 500 mg daily  Hypertension:  Current medications: metoprolol tartrate 37.5 mg twice daily, furosemide 40 mg daily  Hyperlipidemia/ASCVD Risk Reduction  Current lipid lowering medications: currently on gemfibrozil, but needs to be on statin for secondary prevention  Antiplatelet regimen: aspirin 81 mg   Health Maintenance  Health Maintenance Due  Topic Date Due   FOOT EXAM  Never done   OPHTHALMOLOGY EXAM  Never done   Diabetic kidney evaluation - Urine ACR  Never done   Zoster Vaccines- Shingrix (1 of 2) Never done   Medicare Annual Wellness (AWV)  02/03/2016   COVID-19 Vaccine (5 - Pfizer risk series) 09/29/2021     Objective: Lab Results  Component Value Date   HGBA1C 5.1 09/02/2022    Lab Results  Component Value Date   CREATININE 1.16 (H) 09/02/2022   BUN 16 09/02/2022   NA 142 09/02/2022   K 3.6  09/02/2022   CL 99 09/02/2022   CO2 25 09/02/2022    Lab Results  Component Value Date   CHOL 219 (H) 01/02/2022   HDL 51 01/02/2022   LDLCALC 149 (H) 01/02/2022   TRIG 105 01/02/2022   CHOLHDL 4.3 01/02/2022    Medications Reviewed Today     Reviewed by Osker Mason, RPH-CPP (Pharmacist) on 09/03/22 at [phone]  Med List Status: <None>   Medication Order Taking? Sig Documenting Provider Last Dose Status Informant  aspirin EC 81 MG tablet [id number] Yes Take 81 mg by mouth daily. Swallow whole. [provider] Taking Active     Discontinued 11/27/20 1507 (Dose change)            Med Note>> Azzie Glatter, [place]   11/27/2020  3:07 PM dose increased to 40 mg QHS    darunavir (PREZISTA) 800 MG tablet [id number] Yes TAKE 1 TABLET(800 MG) BY MOUTH DAILY WITH BREAKFAST Manilla Callas, NP Taking Active   elvitegravir-cobicistat-emtricitabine-tenofovir (GENVOYA) 150-150-200-10 MG TABS tablet [id number] Yes Take 1 tablet by mouth daily with breakfast. [place] Callas, NP Taking Active   furosemide (LASIX) 40 MG tablet [id number] Yes Take one tablet by mouth daily Fenton Foy, NP Taking Active   hydrOXYzine (ATARAX/VISTARIL) 10 MG tablet [id number]  Take 1 tablet (10 mg total) by mouth 3 (three) times daily as needed. Azzie Glatter, FNP  Active   metFORMIN (GLUCOPHAGE-XR) 500 MG 24 hr tablet [id number] Yes Take 1 tablet (500 mg total) by mouth daily with breakfast. Fenton Foy,  NP Taking Active   metoprolol tartrate (LOPRESSOR) 25 MG tablet [id number] Yes TAKE 1 AND 1/2 TABLETS(37.5 MG) BY MOUTH TWICE DAILY Runell Gess, MD Taking Active   rosuvastatin (CRESTOR) 10 MG tablet [id number] Yes Take 1 tablet (10 mg total) by mouth daily. Ivonne Andrew, NP  Active               Assessment/Plan:   Diabetes: - Currently controlled - Continue current regimen at this time  Hypertension: - Currently controlled but opportunity for optimization - Recommend  to reduce furosemide to as needed for swelling. Discussed with PCP. She is in agreement.   Hyperlipidemia/ASCVD Risk Reduction: - Currently inappropriately managed. - Recommend to stop gemfibrozil and start rosuvastatin 10 mg daily. Discussed with PCP, she is in agreement. Patient verbalized understanding.   Follow Up Plan: phone call in 4 weeks  Catie TClearance Coots, PharmD, [facility] [phone]

## 2022-09-04 IMAGING — MG DIGITAL SCREENING BILAT W/ TOMO W/ CAD
8 series · 8 of 24 positions shown · non-contrast
Comparison: Previous exam(s).

CLINICAL DATA: Screening.

EXAM:
DIGITAL SCREENING BILATERAL MAMMOGRAM WITH TOMO AND CAD

[L MLO synth-2D]
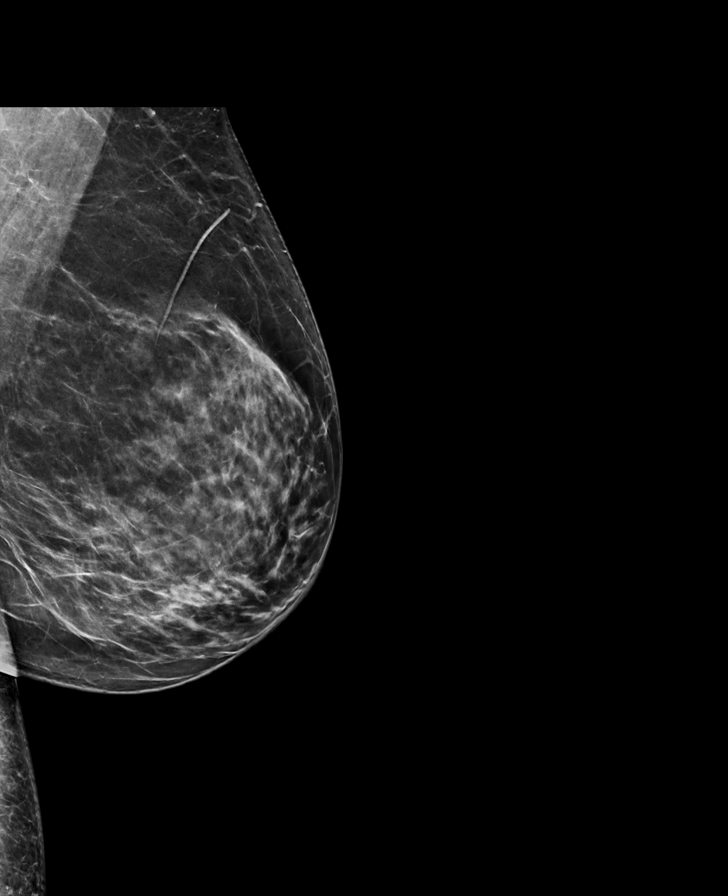

[R CC synth-2D]
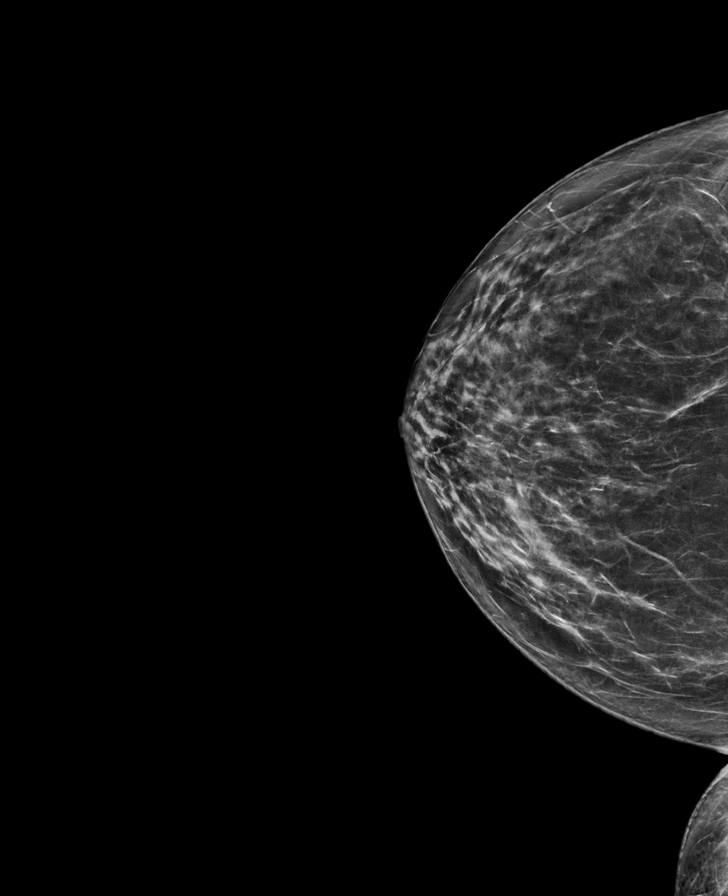

[R MLO synth-2D]
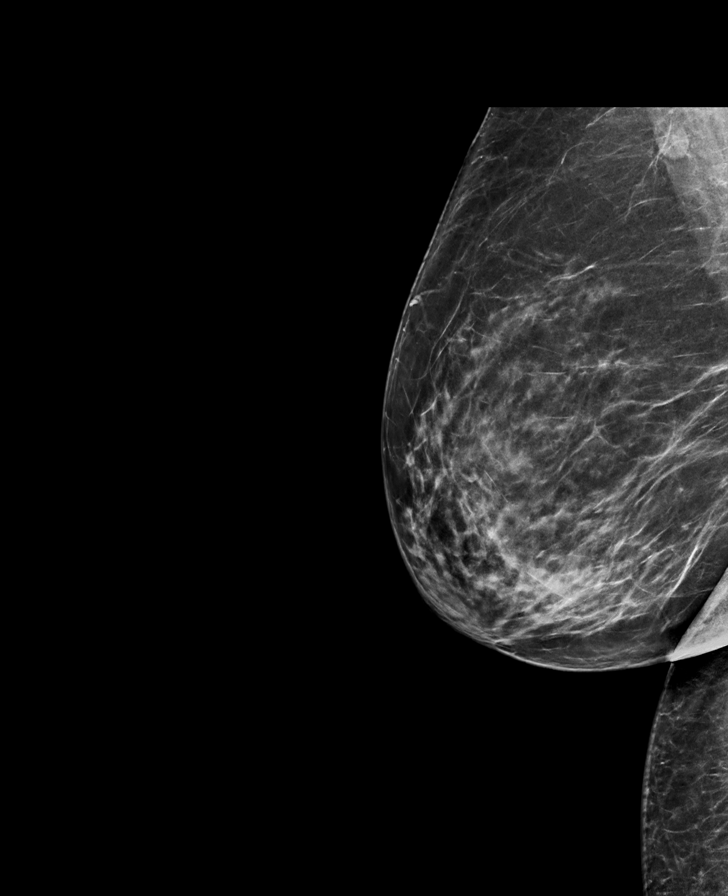

[L CC synth-2D]
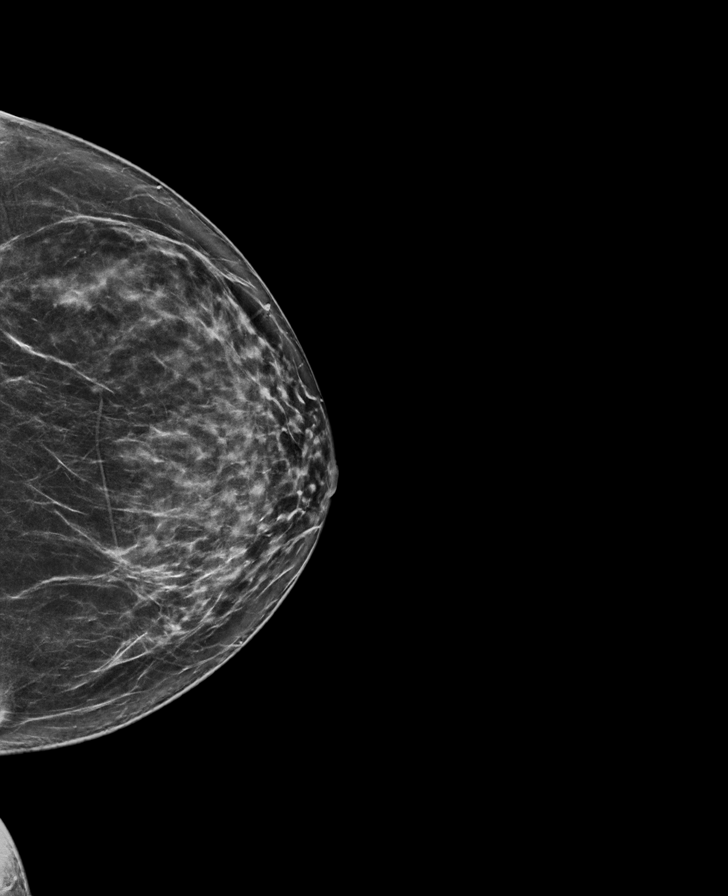

[L MLO tomo · tomo slice 39/78.0]
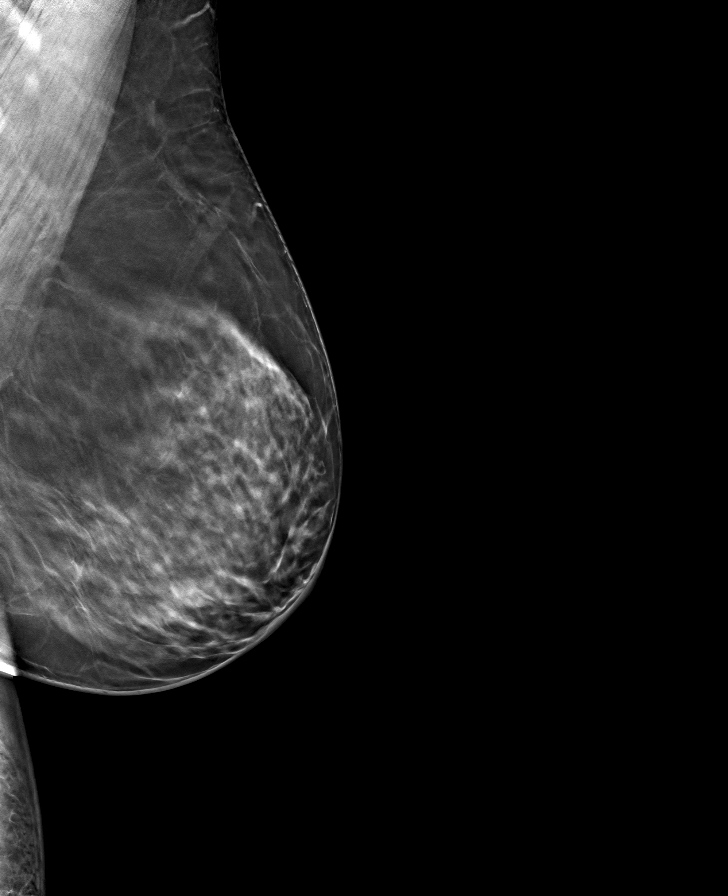

[R CC tomo · tomo slice 37/73.0]
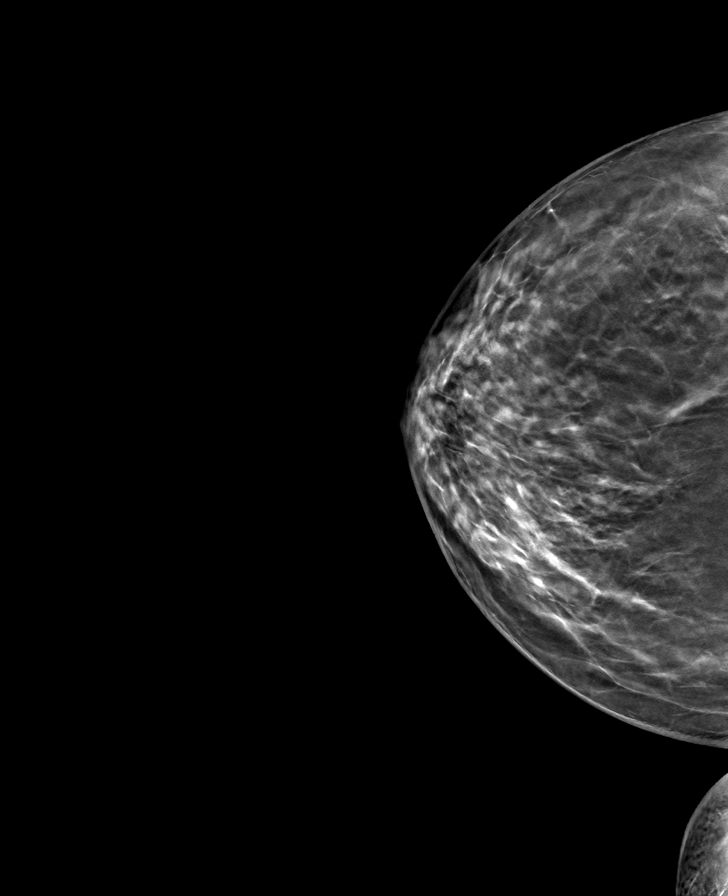

[R MLO tomo · tomo slice 41/82.0]
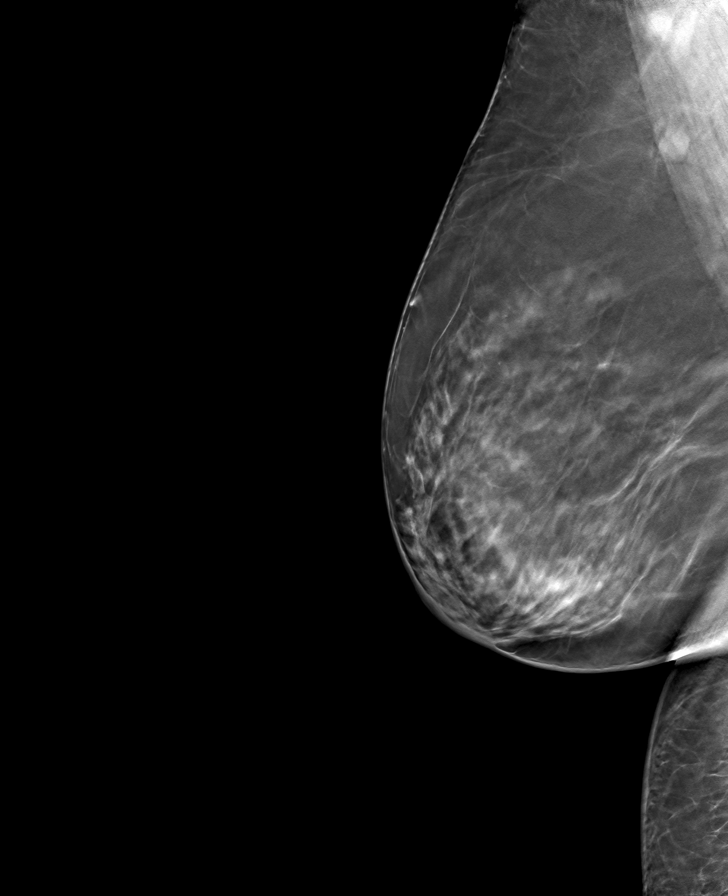

[L CC tomo · tomo slice 37/74.0]
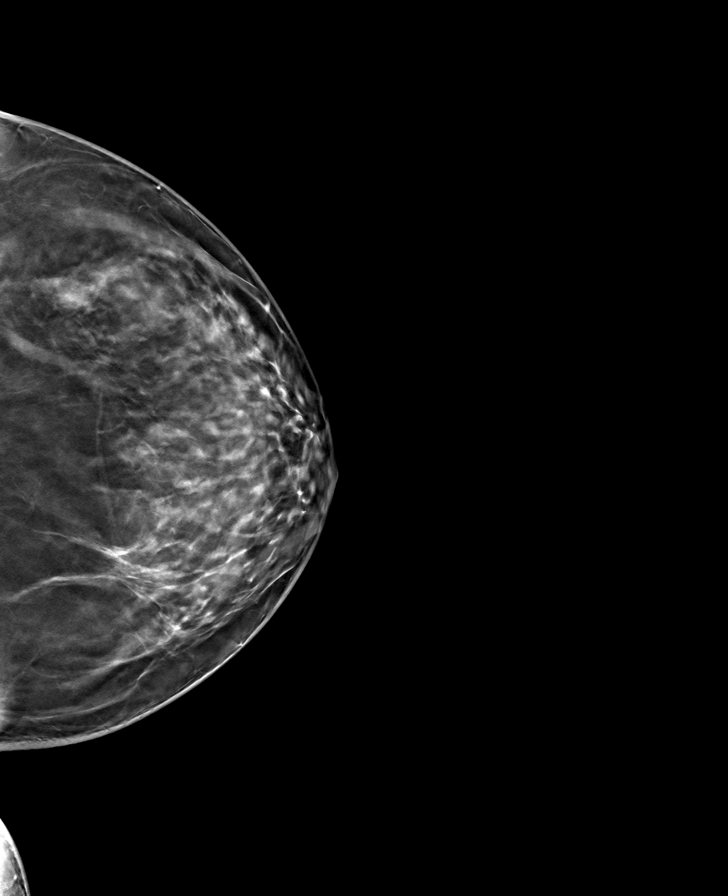

[8 of 24 positions shown; findings below may reference images not displayed]

ACR Breast Density Category c: The breast tissue is heterogeneously
dense, which may obscure small masses.
FINDINGS: There are no findings suspicious for malignancy. Images were
processed with CAD.
IMPRESSION: No mammographic evidence of malignancy. A result letter of this
screening mammogram will be mailed directly to the patient.

RECOMMENDATION:
Screening mammogram in one year. (Code:[id number]-LHB)

BI-RADS CATEGORY  1: Negative.

## 2022-09-15 ENCOUNTER — Other Ambulatory Visit: Payer: Self-pay | Admitting: Pharmacist

## 2022-09-15 NOTE — Progress Notes (Signed)
Care Coordination Call  I was speaking with patient's husband when she noted she had a question. She notes her urine has been smelling for about a week and a half. Denies burning, itching, or increased frequency, except when she takes furosemide. Confirms she made recent medication changes on Saturday, so this development preceded the changes.   Routing to PCP for review and suggestion.   [name], PharmD, [facility] [phone]

## 2022-09-15 NOTE — Progress Notes (Signed)
Received message back from covering provider [facility]. Encoruaged patient to drikn at least 64 oz water daily and call the office if development of any other symptoms (burning, itching, pain while urinating, discharge, or fever). Patient verbalized understanding.   [name], PharmD, [facility] [phone]

## 2022-09-29 ENCOUNTER — Other Ambulatory Visit: Payer: Medicare HMO | Admitting: Pharmacist

## 2022-09-29 NOTE — Progress Notes (Signed)
09/29/2022 Name: Cathy Smith MRN: [id number] DOB: June 19, 1966  Chief Complaint  Patient presents with   Medication Management   Hypertension   Diabetes   Hyperlipidemia    [name] is a 56 y.o. year old female who presented for a telephone visit.   They were referred to the pharmacist by a quality report for assistance in managing hyperlipidemia.     Subjective:  Care Team: Primary Care Provider: Angus Smith ; Next Scheduled Visit: 12/02/21  Medication Access/Adherence  Current Pharmacy:  [id number] - [place], [place] - [address] AT [facility] [address] [place] [address] Phone: [phone] Fax: [phone]  [facility] [phone] [facility] [place] [place] - [address] [place] [address] Phone: [phone] Fax: [phone]   Patient reports affordability concerns with their medications: No  Patient reports access/transportation concerns to their pharmacy: No  Patient reports adherence concerns with their medications:  No    Husband, Cathy Smith, helps confirm how he fills patient's pill box weekly    Diabetes:  Current medications: metformin XR 500 mg twice daily  Hypertension:  Current medications: metoprolol tartrate 37.5 mg twice daily, furosemide 40 mg PRN - reports patient has not needed recently since changing to PRN  Patient has a validated, automated, upper arm home BP cuff Current blood pressure readings readings: He does report "lower" blood pressure readings for patient in the past several weeks - SBP in the 90-100s. Patient denies dizziness, lightheadedness, any pre-syncopal symptoms. They are hypervigilant with blood pressure as Cathy Smith and I are also working together for his BP management.   Patient denies hypotensive s/sx including dizziness, lightheadedness.    Hyperlipidemia/ASCVD Risk Reduction  Current lipid lowering medications: rosuvastatin  10 mg daily   Patient denies any muscle aches/pains  HIV: Current medication: Prezista, Genvoya daily  Health Maintenance  Health Maintenance Due  Topic Date Due   FOOT EXAM  Never done   OPHTHALMOLOGY EXAM  Never done   Diabetic kidney evaluation - Urine ACR  Never done   Zoster Vaccines- Shingrix (1 of 2) Never done   Medicare Annual Wellness (AWV)  01/04/2016   COVID-19 Vaccine (5 - 2023-24 season) 07/10/2022   PAP SMEAR-Modifier  11/14/2022     Objective: Lab Results  Component Value Date   HGBA1C 5.1 09/02/2022    Lab Results  Component Value Date   CREATININE 1.16 (H) 09/02/2022   BUN 16 09/02/2022   NA 142 09/02/2022   K 3.6 09/02/2022   CL 99 09/02/2022   CO2 25 09/02/2022    Lab Results  Component Value Date   CHOL 219 (H) 01/02/2022   HDL 51 01/02/2022   LDLCALC 149 (H) 01/02/2022   TRIG 105 01/02/2022   CHOLHDL 4.3 01/02/2022    Medications Reviewed Today     Reviewed by Cathy Smith, RPH-CPP (Pharmacist) on 09/29/22 at 1033  Med List Status: <None>   Medication Order Taking? Sig Documenting Provider Last Dose Status Informant    Discontinued 11/27/20 1507 (Dose change)            Med Note Cathy Smith, Cathy Smith   Tue Sep 29, 2022 10:32 AM)    darunavir (PREZISTA) 800 MG tablet [id number] Yes TAKE 1 TABLET(800 MG) BY MOUTH DAILY WITH BREAKFAST Cathy Kelch, NP Taking Active   elvitegravir-cobicistat-emtricitabine-tenofovir (GENVOYA) 150-150-200-10 MG TABS tablet [id number] Yes Take 1 tablet by mouth daily with  breakfast. Cathy Kelch, NP Taking Active   furosemide (LASIX) 40 MG tablet [id number] No Take one tablet by mouth daily as needed for swelling  Patient not taking: Reported on 09/29/2022   Cathy Andrew, NP Not Taking Active   hydrOXYzine (ATARAX/VISTARIL) 10 MG tablet [id number] No Take 1 tablet (10 mg total) by mouth 3 (three) times daily as needed.  Patient not taking: Reported on 09/29/2022   Cathy Locks, Cathy Smith Not  Taking Active   metFORMIN (GLUCOPHAGE-XR) 500 MG 24 hr tablet [id number] Yes Take 1 tablet (500 mg total) by mouth daily with breakfast. Cathy Andrew, NP Taking Active   metoprolol tartrate (LOPRESSOR) 25 MG tablet [id number] Yes TAKE 1 AND 1/2 TABLETS(37.5 MG) BY MOUTH TWICE DAILY Cathy Gess, MD Taking Active   rosuvastatin (CRESTOR) 10 MG tablet [id number] Yes Take 1 tablet (10 mg total) by mouth daily. Cathy Andrew, NP Taking Active               Assessment/Plan:   Diabetes: - Currently controlled - Recommend to continue current regimen at this time   Hypertension: - Currently controlled - Reviewed long term cardiovascular and renal outcomes of uncontrolled blood pressure - Reviewed appropriate blood pressure monitoring technique and reviewed goal blood pressure. Recommended to check home blood pressure and heart rate periodically - Recommend to continue current regimen at this time  Hyperlipidemia/ASCVD Risk Reduction: - Currently uncontrolled but anticipate improvement - Recommend to continue current regimen. Note placed in appointment notes for PCP to check lipids, LFT with January appointment (though patient previously tolerated atorvastatin well).    Follow Up Plan: phone call in ~ 8 weeks  Catie TClearance Smith, PharmD, [facility] [phone]

## 2022-09-29 NOTE — Patient Instructions (Signed)
Alasha,   Keep up the great work! Please let us know if you have any questions or concerns.    Take care,   Catie Eppie Gibson, PharmD, [facility] [phone]

## 2022-10-13 ENCOUNTER — Encounter: Payer: Medicare HMO | Attending: Nurse Practitioner | Admitting: Dietician

## 2022-10-13 VITALS — Ht 61.0 in | Wt 142.0 lb

## 2022-10-13 DIAGNOSIS — E119 Type 2 diabetes mellitus without complications: Secondary | ICD-10-CM | POA: Diagnosis not present

## 2022-10-16 ENCOUNTER — Other Ambulatory Visit: Payer: Self-pay | Admitting: Nurse Practitioner

## 2022-10-16 ENCOUNTER — Encounter: Payer: Self-pay | Admitting: Dietician

## 2022-10-16 DIAGNOSIS — E119 Type 2 diabetes mellitus without complications: Secondary | ICD-10-CM

## 2022-10-16 NOTE — Progress Notes (Signed)
Patient was seen on 10/13/2022 for the first of a series of three diabetes self-management courses at [facility]  Patient Education Plan per assessed needs and concerns is to attend three course education program for Diabetes Self Management Education.  A1C was 5.1 on 09/02/2022 decreased from 6.8$ 03/03/2022.  The following learning objectives were met by the patient during this class: Describe diabetes, types of diabetes and pathophysiology State some common risk factors for diabetes Defines the role of glucose and insulin Describe the relationship between diabetes and cardiovascular and other risks State the members of the Healthcare Team States the rationale for glucose monitoring and when to test State their individual [place] the importance of logging glucose readings and how to interpret the readings Identifies A1C target Explain the correlation between A1c and eAG values State symptoms and treatment of high blood glucose and low blood glucose Explain proper technique for glucose testing and identify proper sharps disposal  Handouts given during class include: How to Thrive:  A Guide for Your Journey with Diabetes by the ADA Meal Plan Card and carbohydrate content list Dietary intake form Low Sodium Flavoring Tips Types of Fats Dining Out Label reading Snack list The diabetes portion plate Diabetes Resources A1c to eAG Conversion Chart Blood Glucose Log Diabetes Recommended Care Schedule Support Group Diabetes Success Plan Core Class Satisfaction Survey   Follow-Up Plan: Attend core 2

## 2022-10-19 ENCOUNTER — Other Ambulatory Visit: Payer: Self-pay

## 2022-10-19 ENCOUNTER — Encounter: Payer: Self-pay | Admitting: Infectious Diseases

## 2022-10-19 ENCOUNTER — Ambulatory Visit (INDEPENDENT_AMBULATORY_CARE_PROVIDER_SITE_OTHER): Payer: Medicare HMO | Admitting: Infectious Diseases

## 2022-10-19 VITALS — BP 105/73 | HR 92 | Resp 16 | Ht 61.0 in | Wt 143.0 lb

## 2022-10-19 DIAGNOSIS — D071 Carcinoma in situ of vulva: Secondary | ICD-10-CM | POA: Diagnosis not present

## 2022-10-19 DIAGNOSIS — Z0000 Encounter for general adult medical examination without abnormal findings: Secondary | ICD-10-CM

## 2022-10-19 DIAGNOSIS — B20 Human immunodeficiency virus [HIV] disease: Secondary | ICD-10-CM | POA: Diagnosis not present

## 2022-10-19 MED ORDER — ZOSTER VAC RECOMB ADJUVANTED 50 MCG/0.5ML IM SUSR: 0.5000 mL | INTRAMUSCULAR | 1 refills | Status: DC

## 2022-10-19 MED ORDER — MECLIZINE HCL 25 MG PO TABS: 25.0000 mg | ORAL_TABLET | Freq: Three times a day (TID) | ORAL | 0 refills | Status: DC | PRN

## 2022-10-19 NOTE — Assessment & Plan Note (Signed)
Up-to-date on recommended vaccines aside from Shingrix.  I gave her a prescription to receive this after she returns from her cruise. Working with primary care provider. I gave her a prescription for meclizine for her upcoming cruise per her request to help with any nausea or seasickness.

## 2022-10-19 NOTE — Patient Instructions (Addendum)
Wonderful to see you!   Please keep taking your Prezista and Genvoya together with food like you have been everyday.   Would recommend shingles vaccine (Shingrix) sometime this year for you - can get this at the local pharmacy, I am happy to give a prescription for this for you. Two doses that are 8 weeks apart. Helps to prevent Shingles episodes for you - this risk increases as we age and near 39.   Will see you back in 6 months - can do annual visits at that visit if you are comfortable with that.   Dramamine tablets over the counter if you need them  Will send in Meclizine for you 25 mg tablets - can do one tablet every 6-8 hours as needed for motion sickness if you.

## 2022-10-19 NOTE — Assessment & Plan Note (Addendum)
Very well controlled on once daily Genvoya + Prezista. No concerns with access or adherence to medication. They are tolerating the medication well without side effects. No drug interactions identified. Pertinent lab tests ordered today.  No changes to insurance coverage.  No dental needs today.  No concern over anxious/depressed mood.  Sexual health and family planning discussed - no needs today.  Vaccines updated today - see health maintenance section.    Return in about 6 months (around 04/20/2023).

## 2022-10-19 NOTE — Assessment & Plan Note (Signed)
Needs GYN follow up - reminded to follow up. Has been 1 year since last exam.

## 2022-10-19 NOTE — Progress Notes (Signed)
Name: Cathy Smith  DOB: 1966/10/22 MRN: [id number] PCP: Teena Dunk, NP    Brief Narrative:  Cathy Smith  is a 56 y.o. female with well controlled HIV disease, Dx 2005 per chart review. CD4 nadir 60 VL 12,300  HIV Risk: HIV+ husband, heterosexual History of OIs: vulvar cancer  Intake Labs 2021: Hep B sAg (-), sAb (-), cAb (-); Hep A (not done), Hep C (not done) Quantiferon () HLA B*5701 (-) G6PD: ()   Previous Regimens: Genvoya + Prezista   Genotypes: 2008 - P14S, V35I, S68G, R83K, V106I,  I178M, G196E, [id number], T200I, L214F, E297K, D324E;  V3I, T4S, S37N, L63P  2009 - M184V  2010 - V106I   Cumulative Genotype:  RTI Resistance Mutations: M184V NNRTI Resistance Mutations: V106I  Nucleoside Reverse Transcriptase Inhibitors abacavir (ABC) Low-Level Resistance zidovudine (AZT) Susceptible emtricitabine (FTC) High-Level Resistance lamivudine (3TC) High-Level Resistance tenofovir (TDF) Susceptible  Non-nucleoside Reverse Transcriptase Inhibitors doravirine (DOR) Potential Low-Level Resistance efavirenz (EFV) Susceptible etravirine (ETR) Potential Low-Level Resistance nevirapine (NVP) Potential Low-Level Resistance rilpivirine (RPV) Potential Low-Level Resistance  PI Major Resistance Mutations: None PI Accessory Resistance Mutations: None  Protease Inhibitors atazanavir/r (ATV/r) Susceptible darunavir/r (DRV/r) Susceptible lopinavir/r (LPV/r) Susceptible   Subjective:   Chief Complaint  Patient presents with   Follow-up    B20     HPI: Cathy Smith is here for routine follow up care for HIV. She has had long-standing perfectly controlled HIV on Genvoya + Prezista with last VL < 20 and CD4 493.  She has not missed any doses of her medication and has no problems with it.  Getting ready to go on a cruise to [place] with her family.   Leg pain a/w neuropathy - usually bothers her when she first gets up in the AM. Noted that it bothered her a lot with out  and about. Describes it as a numb/tingle  pain. Takes tylenol for it. Tried gabapentin at one point but did not like the side effects. Working with Lazaro Arms, NP currently. Last A1C 6.8% about 66mago.  She has a history of vulvovaginal dysplasia (VIN III). FU with GYN 11/22. Has not seen them since.    FU with cardiology s/p CABG x 3 in 11/2017 - Follows with Dr. BGwenlyn Foundand doing well from cardiac standpoint. BP under good control on metoprolol. Lasix prn. Crestor daily 10 mg. Received prenar 20 to complete pneumococcal. [facility] Had flu shot this year.    Review of Systems  Constitutional:  Negative for chills and fever.  HENT:  Negative for tinnitus.   Eyes:  Negative for blurred vision and photophobia.  Respiratory:  Negative for cough and sputum production.   Cardiovascular:  Negative for chest pain.  Gastrointestinal:  Negative for diarrhea, nausea and vomiting.  Genitourinary:  Negative for dysuria.  Skin:  Negative for rash.  Neurological:  Negative for headaches.      Past Medical History:  Diagnosis Date   Coronary artery disease    History of condyloma acuminatum    History of uterine fibroid    History of vaginal dysplasia    VAIN III--  oncologist-  dr rDenman George  History of vulvar dysplasia    VIN III   gyn-oncologist-  dr rDenman George  HIV infection ([facility]  dr hatcher   Hypertension    Hypertriglyceridemia without hypercholesterolemia    PONV (postoperative nausea and vomiting)    Type 2 diabetes mellitus without  complication, without long-term current use of insulin ([place]) 09/02/2022   VIN III (vulvar intraepithelial neoplasia III)    Wears glasses     Outpatient Medications Prior to Visit  Medication Sig Dispense Refill   darunavir (PREZISTA) 800 MG tablet TAKE 1 TABLET(800 MG) BY MOUTH DAILY WITH BREAKFAST 30 tablet 11   elvitegravir-cobicistat-emtricitabine-tenofovir (GENVOYA) 150-150-200-10 MG TABS tablet Take  1 tablet by mouth daily with breakfast. 30 tablet 11   metFORMIN (GLUCOPHAGE-XR) 500 MG 24 hr tablet Take 1 tablet (500 mg total) by mouth daily with breakfast. 30 tablet 2   metoprolol tartrate (LOPRESSOR) 25 MG tablet TAKE 1 AND 1/2 TABLETS(37.5 MG) BY MOUTH TWICE DAILY 270 tablet 0   rosuvastatin (CRESTOR) 10 MG tablet Take 1 tablet (10 mg total) by mouth daily. 90 tablet 3   furosemide (LASIX) 40 MG tablet Take one tablet by mouth daily as needed for swelling (Patient not taking: Reported on 09/29/2022) 90 tablet 3   hydrOXYzine (ATARAX/VISTARIL) 10 MG tablet Take 1 tablet (10 mg total) by mouth 3 (three) times daily as needed. (Patient not taking: Reported on 09/29/2022) 90 tablet 1   No facility-administered medications prior to visit.     No Known Allergies  Social History   Tobacco Use   Smoking status: Never   Smokeless tobacco: Never  Vaping Use   Vaping Use: Never used  Substance Use Topics   Alcohol use: No    Alcohol/week: 0.0 standard drinks of alcohol   Drug use: No    Family History  Problem Relation Age of Onset   Cataracts Mother    Hypertension Father    CAD Paternal Grandfather    Colon cancer Neg Hx     Social History   Substance and Sexual Activity  Sexual Activity Yes   Partners: Male   Birth control/protection: Condom   Comment: accepted condoms     Objective:   Vitals:   10/19/22 0949  BP: 105/73  Pulse: 92  Resp: 16  SpO2: 100%  Weight: 143 lb (64.9 kg)  Height: _0  (1.549 m)   Body mass index is 27.02 kg/m.   Physical Exam Constitutional:      Appearance: Normal appearance. She is not ill-appearing.  HENT:     Mouth/Throat:     Mouth: Mucous membranes are moist.     Pharynx: Oropharynx is clear.  Eyes:     General: No scleral icterus. Pulmonary:     Effort: Pulmonary effort is normal.  Neurological:     Mental Status: She is oriented to person, place, and time.  Psychiatric:        Mood and Affect: Mood normal.         Thought Content: Thought content normal.     Lab Results Lab Results  Component Value Date   WBC 4.0 09/02/2022   HGB 11.4 09/02/2022   HCT 33.4 (L) 09/02/2022   MCV 97 09/02/2022   PLT 246 09/02/2022    Lab Results  Component Value Date   CREATININE 1.16 (H) 09/02/2022   BUN 16 09/02/2022   NA 142 09/02/2022   K 3.6 09/02/2022   CL 99 09/02/2022   CO2 25 09/02/2022    Lab Results  Component Value Date   ALT 14 09/02/2022   AST 18 09/02/2022   ALKPHOS 58 09/02/2022   BILITOT 0.4 09/02/2022    Lab Results  Component Value Date   CHOL 219 (H) 01/02/2022   HDL 51 01/02/2022   LDLCALC 149 (  H) 01/02/2022   TRIG 105 01/02/2022   CHOLHDL 4.3 01/02/2022   HIV 1 RNA Quant  Date Value  04/07/2022 CANCELED  08/04/2021 Not Detected Copies/mL  08/12/2020 <20 Copies/mL   CD4 T Cell Abs (/uL)  Date Value  04/07/2022 493  08/04/2021 378 (L)  08/12/2020 448     Assessment & Plan:   Problem List Items Addressed This Visit       Unprioritized   Human immunodeficiency virus (HIV) disease ([place]) (Chronic)    Very well controlled on once daily Genvoya + Prezista. No concerns with access or adherence to medication. They are tolerating the medication well without side effects. No drug interactions identified. Pertinent lab tests ordered today.  No changes to insurance coverage.  No dental needs today.  No concern over anxious/depressed mood.  Sexual health and family planning discussed - no needs today.  Vaccines updated today - see health maintenance section.    Return in about 6 months (around 04/20/2023).        Relevant Medications   Zoster Vaccine Adjuvanted [facility]) injection   Preventative health care    Up-to-date on recommended vaccines aside from Shingrix.  I gave her a prescription to receive this after she returns from her cruise. Working with primary care provider. I gave her a prescription for meclizine for her upcoming cruise per her request to help  with any nausea or seasickness.      VIN III (vulvar intraepithelial neoplasia III)    Needs GYN follow up - reminded to follow up. Has been 1 year since last exam.       Other Visit Diagnoses     HIV disease ([place])    -  Primary   Relevant Medications   Zoster Vaccine Adjuvanted [facility]) injection   Other Relevant Orders   HIV 1 RNA quant-no reflex-bld   T-helper cells (CD4) count       Janene Madeira, MSN, NP-C [facility] for Infectious [place] Pager: [phone] Office: [phone]  10/19/22  10:32 AM

## 2022-10-20 ENCOUNTER — Encounter: Payer: Self-pay | Admitting: Dietician

## 2022-10-20 ENCOUNTER — Encounter: Payer: Medicare HMO | Admitting: Dietician

## 2022-10-20 DIAGNOSIS — E119 Type 2 diabetes mellitus without complications: Secondary | ICD-10-CM | POA: Diagnosis not present

## 2022-10-20 LAB — T-HELPER CELLS (CD4) COUNT (NOT AT ARMC)
CD4 % Helper T Cell: 27 % — ABNORMAL LOW (ref 33–65)
CD4 T Cell Abs: 489 /uL (ref 400–1790)

## 2022-10-20 NOTE — Progress Notes (Signed)
Patient was seen on 10/20/2022 for the second of a series of three diabetes self-management courses at [facility]. The following learning objectives were met by the patient during this class:  Describe the role of different macronutrients on glucose Explain how carbohydrates affect blood glucose State what foods contain the most carbohydrates Demonstrate carbohydrate counting Demonstrate how to read Nutrition Facts food label Describe effects of various fats on heart health Describe the importance of good nutrition for health and healthy eating strategies Describe techniques for managing your shopping, cooking and meal planning List strategies to follow meal plan when dining out Describe the effects of alcohol on glucose and how to use it safely  Goals:  Follow Diabetes Meal Plan as instructed  Aim to spread carbs evenly throughout the day  Aim for 3 meals per day and snacks as needed Include lean protein foods to meals/snacks  Monitor glucose levels as instructed by your doctor   Follow-Up Plan: Attend Core 3 Work towards following your personal food plan.

## 2022-10-21 LAB — HIV-1 RNA QUANT-NO REFLEX-BLD
HIV 1 RNA Quant: NOT DETECTED Copies/mL
HIV-1 RNA Quant, Log: NOT DETECTED Log cps/mL

## 2022-10-23 ENCOUNTER — Other Ambulatory Visit: Payer: Self-pay

## 2022-10-23 ENCOUNTER — Other Ambulatory Visit: Payer: Self-pay | Admitting: Cardiovascular Disease

## 2022-10-23 ENCOUNTER — Other Ambulatory Visit: Payer: Self-pay | Admitting: Nurse Practitioner

## 2022-10-23 DIAGNOSIS — E119 Type 2 diabetes mellitus without complications: Secondary | ICD-10-CM

## 2022-10-23 DIAGNOSIS — E785 Hyperlipidemia, unspecified: Secondary | ICD-10-CM

## 2022-10-23 MED ORDER — METFORMIN HCL ER 500 MG PO TB24: 500.0000 mg | ORAL_TABLET | Freq: Every day | ORAL | 2 refills | Status: DC

## 2022-10-27 ENCOUNTER — Telehealth: Payer: Self-pay

## 2022-10-27 ENCOUNTER — Ambulatory Visit: Payer: Medicare HMO

## 2022-10-27 DIAGNOSIS — M62838 Other muscle spasm: Secondary | ICD-10-CM | POA: Diagnosis not present

## 2022-10-27 NOTE — Telephone Encounter (Signed)
Received a voice message from patient stating that she is currently having neck pain since Saturday. Spoke to patient who stated that she was currently at urgent care to be seen for neck pain. I let patient know that provider could see her on 12/20 at 3:30 if she wanted to be seen here. Patient stated she will wait to see what urgent care does and will call back to schedule appointment to see  NP Rexene Alberts if needed.

## 2022-10-28 ENCOUNTER — Telehealth: Payer: Self-pay | Admitting: Nurse Practitioner

## 2022-10-28 NOTE — Telephone Encounter (Signed)
Left message for patient to call back and schedule Medicare Annual Wellness Visit (AWV).  Please offer to do virtually or by telephone.   No history of  AWV: Per [place] eligible for AWVI as of  11/09/2009  Please schedule at any time with [facility]   -Nurse Health Advisor   If any questions, please contact me at [phone]  Thank you,  [facility]  Ambulatory Clinical Support for Care Management [facility] You Are. We Are. [facility]  ??[phone] or ??[phone]

## 2022-11-10 ENCOUNTER — Other Ambulatory Visit: Payer: Self-pay | Admitting: Nurse Practitioner

## 2022-11-10 DIAGNOSIS — Z1231 Encounter for screening mammogram for malignant neoplasm of breast: Secondary | ICD-10-CM

## 2022-11-11 ENCOUNTER — Telehealth: Payer: Self-pay

## 2022-11-11 NOTE — Telephone Encounter (Signed)
Patient called wanting to know if she needed Hepatitis B vaccines. Relayed that she has received these vaccines and her labs show immunity to HBV so no need for further HBV immunizations. Patient verbalized understanding and has no further questions.   Beryle Flock, RN

## 2022-12-02 ENCOUNTER — Ambulatory Visit: Payer: Self-pay | Admitting: Nurse Practitioner

## 2022-12-16 ENCOUNTER — Encounter: Payer: Self-pay | Admitting: Nurse Practitioner

## 2022-12-16 ENCOUNTER — Ambulatory Visit: Payer: Self-pay | Admitting: Nurse Practitioner

## 2022-12-16 ENCOUNTER — Ambulatory Visit (INDEPENDENT_AMBULATORY_CARE_PROVIDER_SITE_OTHER): Payer: Medicare PPO | Admitting: Nurse Practitioner

## 2022-12-16 DIAGNOSIS — E785 Hyperlipidemia, unspecified: Secondary | ICD-10-CM | POA: Diagnosis not present

## 2022-12-16 DIAGNOSIS — I10 Essential (primary) hypertension: Secondary | ICD-10-CM | POA: Diagnosis not present

## 2022-12-16 DIAGNOSIS — E119 Type 2 diabetes mellitus without complications: Secondary | ICD-10-CM

## 2022-12-16 LAB — POCT GLYCOSYLATED HEMOGLOBIN (HGB A1C): Hemoglobin A1C: 5.8 % — AB (ref 4.0–5.6)

## 2022-12-16 MED ORDER — FUROSEMIDE 40 MG PO TABS: ORAL_TABLET | ORAL | 3 refills | Status: DC

## 2022-12-16 MED ORDER — GEMFIBROZIL 600 MG PO TABS: ORAL_TABLET | ORAL | 0 refills | Status: DC

## 2022-12-16 MED ORDER — ROSUVASTATIN CALCIUM 10 MG PO TABS: 10.0000 mg | ORAL_TABLET | Freq: Every day | ORAL | 3 refills | Status: DC

## 2022-12-16 MED ORDER — METFORMIN HCL ER 500 MG PO TB24: 500.0000 mg | ORAL_TABLET | Freq: Every day | ORAL | 2 refills | Status: DC

## 2022-12-16 MED ORDER — METOPROLOL TARTRATE 25 MG PO TABS: ORAL_TABLET | ORAL | 0 refills | Status: DC

## 2022-12-16 NOTE — Progress Notes (Signed)
@Patient  ID: Cathy Smith, female    DOB: 1966-06-14, 57 y.o.   MRN: [id number]  Chief Complaint  Patient presents with   Follow-up    Referring provider: Teena Dunk, NP   HPI  Cathy Smith 57 y.o. female  has a past medical history of Coronary artery disease, History of condyloma acuminatum, History of uterine fibroid, History of vaginal dysplasia, History of vulvar dysplasia, HIV infection ([place]) ([facility] hatcher), Hypertension, Hypertriglyceridemia without hypercholesterolemia, PONV (postoperative nausea and vomiting), VIN III (vulvar intraepithelial neoplasia III), and Wears glasses. To the [facility] for reevaluation of hypertension.   Hypertension: Patient here for follow-up of elevated blood pressure. She is not exercising and is adherent to low salt diet.  Blood pressure is well controlled at home. Cardiac symptoms none. Patient denies none.  Cardiovascular risk factors: hypertension, obesity (BMI >= 30 kg/m2), and sedentary lifestyle. Use of agents associated with hypertension: none. History of target organ damage: none. States that she regularly takes B/P at home and it is similar to today's values. Currently compliant with all medications.   Diabetes: Presents for follow up on diabetes. Currently on metformin for diabetes - working on diabetic diet. Patient has been to nutritionist, but states that she is still eating unhealthy. Did discuss healthy food choices today. A1c in office today was good. Denies f/c/s, n/v/d, hemoptysis, PND, leg swelling Denies chest pain or edema   Denies f/c/s, n/v/d, hemoptysis, PND, leg swelling Denies chest pain or edema       No Known Allergies  Immunization History  Administered Date(s) Administered   COVID-19, mRNA, vaccine(Comirnaty)12 years and older 08/26/2022   H1N1 10/18/2008   Hepatitis B 01/13/2001, 02/02/2001, 03/02/2001, 11/16/2001   Hepatitis B, adult 02/05/2014, 08/08/2014, 02/11/2015    Influenza Split 09/02/2011, 12/20/2012   Influenza Whole 08/30/2006, 08/03/2007, 09/24/2008, 08/07/2009, 09/11/2010   Influenza,inj,Quad PF,6+ Mos 08/02/2013, 08/08/2014, 08/27/2015, 08/19/2016, 09/20/2017, 08/15/2018, 08/16/2019, 08/12/2020, 08/04/2021, 08/26/2022   Meningococcal Mcv4o 04/21/2017   PFIZER(Purple Top)SARS-COV-2 Vaccination 01/25/2020, 02/19/2020, 08/30/2020   PNEUMOCOCCAL CONJUGATE-20 04/07/2022   Pfizer Covid-19 Vaccine Bivalent Booster 43yrs & up 08/04/2021   Pneumococcal Conjugate-13 01/13/2016   Pneumococcal Polysaccharide-23 08/30/2006, 12/31/2010   Tdap 09/06/2014    Past Medical History:  Diagnosis Date   Coronary artery disease    History of condyloma acuminatum    History of uterine fibroid    History of vaginal dysplasia    VAIN III--  oncologist-  dr Denman George   History of vulvar dysplasia    VIN III   gyn-oncologist-  dr Denman George   HIV infection [facility]) montiored by [place]-  dr hatcher   Hypertension    Hypertriglyceridemia without hypercholesterolemia    PONV (postoperative nausea and vomiting)    Type 2 diabetes mellitus without complication, without long-term current use of insulin ([place]) 09/02/2022   VIN III (vulvar intraepithelial neoplasia III)    Wears glasses     Tobacco History: Social History   Tobacco Use  Smoking Status Never  Smokeless Tobacco Never   Counseling given: Not Answered   Outpatient Encounter Medications as of 12/16/2022  Medication Sig   darunavir (PREZISTA) 800 MG tablet TAKE 1 TABLET(800 MG) BY MOUTH DAILY WITH BREAKFAST   elvitegravir-cobicistat-emtricitabine-tenofovir (GENVOYA) 150-150-200-10 MG TABS tablet Take 1 tablet by mouth daily with breakfast.   [DISCONTINUED] furosemide (LASIX) 40 MG tablet Take one tablet by mouth daily as needed for swelling   [DISCONTINUED] gemfibrozil (LOPID) 600 MG tablet TAKE 1  TABLET(600 MG) BY MOUTH TWICE DAILY BEFORE A MEAL   [DISCONTINUED] metFORMIN (GLUCOPHAGE-XR)  500 MG 24 hr tablet Take 1 tablet (500 mg total) by mouth daily with breakfast.   [DISCONTINUED] metoprolol tartrate (LOPRESSOR) 25 MG tablet TAKE 1 AND 1/2 TABLETS(37.5 MG) BY MOUTH TWICE DAILY   [DISCONTINUED] rosuvastatin (CRESTOR) 10 MG tablet Take 1 tablet (10 mg total) by mouth daily.   furosemide (LASIX) 40 MG tablet Take one tablet by mouth daily as needed for swelling   gemfibrozil (LOPID) 600 MG tablet TAKE 1 TABLET(600 MG) BY MOUTH TWICE DAILY BEFORE A MEAL   hydrOXYzine (ATARAX/VISTARIL) 10 MG tablet Take 1 tablet (10 mg total) by mouth 3 (three) times daily as needed. (Patient not taking: Reported on 09/29/2022)   meclizine (ANTIVERT) 25 MG tablet Take 1 tablet (25 mg total) by mouth 3 (three) times daily as needed for dizziness. (Patient not taking: Reported on 12/16/2022)   metFORMIN (GLUCOPHAGE-XR) 500 MG 24 hr tablet Take 1 tablet (500 mg total) by mouth daily with breakfast.   metoprolol tartrate (LOPRESSOR) 25 MG tablet Take 1 and 1/2 tablets by mouth daily   rosuvastatin (CRESTOR) 10 MG tablet Take 1 tablet (10 mg total) by mouth daily.   Zoster Vaccine Adjuvanted [facility]) injection Inject 0.5 mLs into the muscle every 8 (eight) weeks.   [DISCONTINUED] atorvastatin (LIPITOR) 20 MG tablet TAKE 1 TABLET(20 MG) BY MOUTH DAILY AT 6 PM   No facility-administered encounter medications on file as of 12/16/2022.     Review of Systems  Review of Systems  Constitutional: Negative.   HENT: Negative.    Cardiovascular: Negative.   Gastrointestinal: Negative.   Allergic/Immunologic: Negative.   Neurological: Negative.   Psychiatric/Behavioral: Negative.         Physical Exam  BP 100/64   Pulse 73   Temp (!) 97.3 F (36.3 C)   Ht 5\' 1"  (1.549 m)   Wt 135 lb 9.6 oz (61.5 kg)   SpO2 99%   BMI 25.62 kg/m   Wt Readings from Last 5 Encounters:  12/16/22 135 lb 9.6 oz (61.5 kg)  10/19/22 143 lb (64.9 kg)  10/16/22 142 lb (64.4 kg)  09/02/22 142 lb (64.4 kg)  04/07/22 155  lb (70.3 kg)     Physical Exam Vitals and nursing note reviewed.  Constitutional:      General: She is not in acute distress.    Appearance: She is well-developed.  Cardiovascular:     Rate and Rhythm: Normal rate and regular rhythm.  Pulmonary:     Effort: Pulmonary effort is normal.     Breath sounds: Normal breath sounds.  Neurological:     Mental Status: She is alert and oriented to person, place, and time.      Lab Results:  CBC    Component Value Date/Time   WBC 4.0 09/02/2022 1129   WBC 3.7 (L) 08/04/2021 1013   RBC 3.45 (L) 09/02/2022 1129   RBC 3.88 08/04/2021 1013   HGB 11.4 09/02/2022 1129   HCT 33.4 (L) 09/02/2022 1129   PLT 246 09/02/2022 1129   MCV 97 09/02/2022 1129   MCH 33.0 09/02/2022 1129   MCH 32.5 08/04/2021 1013   MCHC 34.1 09/02/2022 1129   MCHC 33.3 08/04/2021 1013   RDW 11.3 (L) 09/02/2022 1129   LYMPHSABS 1.7 09/02/2022 1129   MONOABS 408 04/13/2017 1122   EOSABS 0.1 09/02/2022 1129   BASOSABS 0.0 09/02/2022 1129    BMET    Component Value  Date/Time   NA 142 09/02/2022 1129   K 3.6 09/02/2022 1129   CL 99 09/02/2022 1129   CO2 25 09/02/2022 1129   GLUCOSE 91 09/02/2022 1129   GLUCOSE 130 (H) 08/04/2021 1013   BUN 16 09/02/2022 1129   CREATININE 1.16 (H) 09/02/2022 1129   CREATININE 1.10 (H) 08/04/2021 1013   CALCIUM 9.1 09/02/2022 1129   GFRNONAA 65 11/15/2020 1403   GFRNONAA 68 08/01/2019 0908   GFRAA 76 11/15/2020 1403   GFRAA 79 08/01/2019 0908     Assessment & Plan:   Type 2 diabetes mellitus without complication, without long-term current use of insulin (HCC) - metFORMIN (GLUCOPHAGE-XR) 500 MG 24 hr tablet; Take 1 tablet (500 mg total) by mouth daily with breakfast.  Dispense: 30 tablet; Refill: 2 - CBC - Comprehensive metabolic panel  2. Essential hypertension  - furosemide (LASIX) 40 MG tablet; Take one tablet by mouth daily as needed for swelling  Dispense: 90 tablet; Refill: 3  3. Hyperlipidemia, unspecified  hyperlipidemia type  - gemfibrozil (LOPID) 600 MG tablet; TAKE 1 TABLET(600 MG) BY MOUTH TWICE DAILY BEFORE A MEAL  Dispense: 180 tablet; Refill: 0 - Lipid Panel  Follow up:  Follow up in 3 months     Fenton Foy, NP 12/16/2022

## 2022-12-16 NOTE — Patient Instructions (Addendum)
1. Type 2 diabetes mellitus without complication, without long-term current use of insulin (HCC)  - metFORMIN (GLUCOPHAGE-XR) 500 MG 24 hr tablet; Take 1 tablet (500 mg total) by mouth daily with breakfast.  Dispense: 30 tablet; Refill: 2 - CBC - Comprehensive metabolic panel  2. Essential hypertension  - furosemide (LASIX) 40 MG tablet; Take one tablet by mouth daily as needed for swelling  Dispense: 90 tablet; Refill: 3  3. Hyperlipidemia, unspecified hyperlipidemia type  - gemfibrozil (LOPID) 600 MG tablet; TAKE 1 TABLET(600 MG) BY MOUTH TWICE DAILY BEFORE A MEAL  Dispense: 180 tablet; Refill: 0 - Lipid Panel  Follow up:  Follow up in 3 months  Diabetes Mellitus and Nutrition, Adult When you have diabetes, or diabetes mellitus, it is very important to have healthy eating habits because your blood sugar (glucose) levels are greatly affected by what you eat and drink. Eating healthy foods in the right amounts, at about the same times every day, can help you: Manage your blood glucose. Lower your risk of heart disease. Improve your blood pressure. Reach or maintain a healthy weight. What can affect my meal plan? Every person with diabetes is different, and each person has different needs for a meal plan. Your health care provider may recommend that you work with a dietitian to make a meal plan that is best for you. Your meal plan may vary depending on factors such as: The calories you need. The medicines you take. Your weight. Your blood glucose, blood pressure, and cholesterol levels. Your activity level. Other health conditions you have, such as heart or kidney disease. How do carbohydrates affect me? Carbohydrates, also called carbs, affect your blood glucose level more than any other type of food. Eating carbs raises the amount of glucose in your blood. It is important to know how many carbs you can safely have in each meal. This is different for every person. Your dietitian  can help you calculate how many carbs you should have at each meal and for each snack. How does alcohol affect me? Alcohol can cause a decrease in blood glucose (hypoglycemia), especially if you use insulin or take certain diabetes medicines by mouth. Hypoglycemia can be a life-threatening condition. Symptoms of hypoglycemia, such as sleepiness, dizziness, and confusion, are similar to symptoms of having too much alcohol. Do not drink alcohol if: Your health care provider tells you not to drink. You are pregnant, may be pregnant, or are planning to become pregnant. If you drink alcohol: Limit how much you have to: 0-1 drink a day for women. 0-2 drinks a day for men. Know how much alcohol is in your drink. In the [place] one drink equals one 12 oz bottle of beer (355 mL), one 5 oz glass of wine (148 mL), or one 1 oz glass of hard liquor (44 mL). Keep yourself hydrated with water, diet soda, or unsweetened iced tea. Keep in mind that regular soda, juice, and other mixers may contain a lot of sugar and must be counted as carbs. What are tips for following this plan?  Reading food labels Start by checking the serving size on the Nutrition Facts label of packaged foods and drinks. The number of calories and the amount of carbs, fats, and other nutrients listed on the label are based on one serving of the item. Many items contain more than one serving per package. Check the total grams (g) of carbs in one serving. Check the number of grams of saturated fats and trans fats  in one serving. Choose foods that have a low amount or none of these fats. Check the number of milligrams (mg) of salt (sodium) in one serving. Most people should limit total sodium intake to less than 2,300 mg per day. Always check the nutrition information of foods labeled as "low-fat" or "nonfat." These foods may be higher in added sugar or refined carbs and should be avoided. Talk to your dietitian to identify your daily goals for  nutrients listed on the label. Shopping Avoid buying canned, pre-made, or processed foods. These foods tend to be high in fat, sodium, and added sugar. Shop around the outside edge of the grocery store. This is where you will most often find fresh fruits and vegetables, bulk grains, fresh meats, and fresh dairy products. Cooking Use low-heat cooking methods, such as baking, instead of high-heat cooking methods, such as deep frying. Cook using healthy oils, such as olive, canola, or sunflower oil. Avoid cooking with butter, cream, or high-fat meats. Meal planning Eat meals and snacks regularly, preferably at the same times every day. Avoid going long periods of time without eating. Eat foods that are high in fiber, such as fresh fruits, vegetables, beans, and whole grains. Eat 4-6 oz (112-168 g) of lean protein each day, such as lean meat, chicken, fish, eggs, or tofu. One ounce (oz) (28 g) of lean protein is equal to: 1 oz (28 g) of meat, chicken, or fish. 1 egg.  cup (62 g) of tofu. Eat some foods each day that contain healthy fats, such as avocado, nuts, seeds, and fish. What foods should I eat? Fruits Berries. Apples. Oranges. Peaches. Apricots. Plums. Grapes. Mangoes. Papayas. Pomegranates. Kiwi. Cherries. Vegetables Leafy greens, including lettuce, spinach, kale, chard, collard greens, mustard greens, and cabbage. Beets. Cauliflower. Broccoli. Carrots. Green beans. Tomatoes. Peppers. Onions. Cucumbers. Brussels sprouts. Grains Whole grains, such as whole-wheat or whole-grain bread, crackers, tortillas, cereal, and pasta. Unsweetened oatmeal. Quinoa. Brown or wild rice. Meats and other proteins Seafood. Poultry without skin. Lean cuts of poultry and beef. Tofu. Nuts. Seeds. Dairy Low-fat or fat-free dairy products such as milk, yogurt, and cheese. The items listed above may not be a complete list of foods and beverages you can eat and drink. Contact a dietitian for more  information. What foods should I avoid? Fruits Fruits canned with syrup. Vegetables Canned vegetables. Frozen vegetables with butter or cream sauce. Grains Refined white flour and flour products such as bread, pasta, snack foods, and cereals. Avoid all processed foods. Meats and other proteins Fatty cuts of meat. Poultry with skin. Breaded or fried meats. Processed meat. Avoid saturated fats. Dairy Full-fat yogurt, cheese, or milk. Beverages Sweetened drinks, such as soda or iced tea. The items listed above may not be a complete list of foods and beverages you should avoid. Contact a dietitian for more information. Questions to ask a health care provider Do I need to meet with a certified diabetes care and education specialist? Do I need to meet with a dietitian? What number can I call if I have questions? When are the best times to check my blood glucose? Where to find more information: American Diabetes Association: diabetes.org Academy of Nutrition and Dietetics: eatright.[employer] of Diabetes and Digestive and Kidney Diseases: [employer] Association of Diabetes Care & Education Specialists: diabeteseducator.org Summary It is important to have healthy eating habits because your blood sugar (glucose) levels are greatly affected by what you eat and drink. It is important to use alcohol carefully. A healthy  meal plan will help you manage your blood glucose and lower your risk of heart disease. Your health care provider may recommend that you work with a dietitian to make a meal plan that is best for you. This information is not intended to replace advice given to you by your health care provider. Make sure you discuss any questions you have with your health care provider. Document Revised: 05/29/2020 Document Reviewed: 05/29/2020 Elsevier Patient Education  [place].

## 2022-12-16 NOTE — Assessment & Plan Note (Signed)
-   metFORMIN (GLUCOPHAGE-XR) 500 MG 24 hr tablet; Take 1 tablet (500 mg total) by mouth daily with breakfast.  Dispense: 30 tablet; Refill: 2 - CBC - Comprehensive metabolic panel  2. Essential hypertension  - furosemide (LASIX) 40 MG tablet; Take one tablet by mouth daily as needed for swelling  Dispense: 90 tablet; Refill: 3  3. Hyperlipidemia, unspecified hyperlipidemia type  - gemfibrozil (LOPID) 600 MG tablet; TAKE 1 TABLET(600 MG) BY MOUTH TWICE DAILY BEFORE A MEAL  Dispense: 180 tablet; Refill: 0 - Lipid Panel  Follow up:  Follow up in 3 months

## 2022-12-17 LAB — LIPID PANEL
Chol/HDL Ratio: 3.5 ratio (ref 0.0–4.4)
Cholesterol, Total: 119 mg/dL (ref 100–199)
HDL: 34 mg/dL — ABNORMAL LOW (ref >39)
LDL Chol Calc (NIH): 58 mg/dL (ref 0–99)
Triglycerides: 159 mg/dL — ABNORMAL HIGH (ref 0–149)
VLDL Cholesterol Cal: 27 mg/dL (ref 5–40)

## 2022-12-17 LAB — COMPREHENSIVE METABOLIC PANEL
ALT: 17 IU/L (ref 0–32)
AST: 22 IU/L (ref 0–40)
Albumin/Globulin Ratio: 1.6 (ref 1.2–2.2)
Albumin: 4.5 g/dL (ref 3.8–4.9)
Alkaline Phosphatase: 58 IU/L (ref 44–121)
BUN/Creatinine Ratio: 8 — ABNORMAL LOW (ref 9–23)
BUN: 8 mg/dL (ref 6–24)
Bilirubin Total: 0.6 mg/dL (ref 0.0–1.2)
CO2: 28 mmol/L (ref 20–29)
Calcium: 8.9 mg/dL (ref 8.7–10.2)
Chloride: 99 mmol/L (ref 96–106)
Creatinine, Ser: 0.97 mg/dL (ref 0.57–1.00)
Globulin, Total: 2.8 g/dL (ref 1.5–4.5)
Glucose: 123 mg/dL — ABNORMAL HIGH (ref 70–99)
Potassium: 3.4 mmol/L — ABNORMAL LOW (ref 3.5–5.2)
Sodium: 143 mmol/L (ref 134–144)
Total Protein: 7.3 g/dL (ref 6.0–8.5)
eGFR: 67 mL/min/{1.73_m2} (ref >59)

## 2022-12-17 LAB — CBC
Hematocrit: 37.4 % (ref 34.0–46.6)
Hemoglobin: 12.8 g/dL (ref 11.1–15.9)
MCH: 32.7 pg (ref 26.6–33.0)
MCHC: 34.2 g/dL (ref 31.5–35.7)
MCV: 95 fL (ref 79–97)
Platelets: 238 10*3/uL (ref 150–450)
RBC: 3.92 x10E6/uL (ref 3.77–5.28)
RDW: 11.2 % — ABNORMAL LOW (ref 11.7–15.4)
WBC: 3.2 10*3/uL — ABNORMAL LOW (ref 3.4–10.8)

## 2022-12-18 ENCOUNTER — Other Ambulatory Visit: Payer: Self-pay | Admitting: Nurse Practitioner

## 2022-12-18 DIAGNOSIS — E119 Type 2 diabetes mellitus without complications: Secondary | ICD-10-CM

## 2022-12-23 ENCOUNTER — Telehealth: Payer: Self-pay

## 2022-12-23 NOTE — Telephone Encounter (Signed)
PA Case: [id number], Status: Approved, Coverage Starts on: 11/09/2022 12:00:00 AM, Coverage Ends on: 11/09/2023 12:00:00 AM. Questions? Contact [phone]. Authorization Expiration Date: 11/08/2023 Leatrice Jewels, RMA

## 2022-12-23 NOTE — Telephone Encounter (Signed)
Initiated PA for patient's Prez[facility] today through covermymeds. Waiting on outcome. Leatrice Jewels, RMA

## 2022-12-25 ENCOUNTER — Ambulatory Visit: Payer: Medicare PPO | Attending: Cardiovascular Disease | Admitting: Cardiovascular Disease

## 2022-12-25 ENCOUNTER — Encounter: Payer: Self-pay | Admitting: Cardiovascular Disease

## 2022-12-25 VITALS — BP 104/68 | HR 65 | Ht 61.0 in | Wt 139.0 lb

## 2022-12-25 DIAGNOSIS — E781 Pure hyperglyceridemia: Secondary | ICD-10-CM | POA: Diagnosis not present

## 2022-12-25 DIAGNOSIS — Z951 Presence of aortocoronary bypass graft: Secondary | ICD-10-CM | POA: Diagnosis not present

## 2022-12-25 DIAGNOSIS — I10 Essential (primary) hypertension: Secondary | ICD-10-CM | POA: Diagnosis not present

## 2022-12-25 NOTE — Assessment & Plan Note (Signed)
History of hyperlipidemia on Lopid and rosuvastatin with lipid profile performed 12/16/2022 revealing a total cholesterol of 119, LDL 58 and HDL 34.

## 2022-12-25 NOTE — Patient Instructions (Signed)

## 2022-12-25 NOTE — Assessment & Plan Note (Signed)
History of CAD status post cardiac catheterization performed by myself 11/22/2017 after a Myoview stress test showed LAD territory ischemia.  She had left main/three-vessel disease and underwent CABG x 3 by Dr. Lilly Cove  11/25/2017 with a LIMA to her LAD, vein to obtuse marginal branch and to the posterolateral branch of the RCA.  Her postop course was uncomplicated.  She denies chest pain or shortness of breath.

## 2022-12-25 NOTE — Assessment & Plan Note (Signed)
History of essential hypertension a blood pressure measured today at 104/68.  She is on metoprolol.

## 2022-12-25 NOTE — Progress Notes (Signed)
12/25/2022 Cathy Smith   [birth year]  [id number]  Primary Physician Fenton Foy, NP Primary Cardiologist: Lorretta Harp MD Lupe Carney, [place]  HPI:  Cathy Smith is a 57 y.o.  moderately overweight married African-American female with no children.  She does not work. She was referred to me by Molli Barrows FNP for new onset chest pain. I last saw her in the office 04/09/2021.  She is accompanied by her husband [name] today.  She really has no cardiac risk factors other than hypertension hyperlipidemia both of which are treated. There is no family history. She does not smoke. She had onset of chest pain 3-4 months ago,, currently trying to 3 times a week lasting up to 5-10 minutes at a time associated with shortness of breath.  she had a Myoview stress test that showed severe ischemia in the LAD territory. She presents underwent cardiac catheterization by myself 11/22/17 revealing left main/three-vessel disease with preserved LV function. She underwent coronary artery bypass graft 3  11/25/17 by Dr. Lilly Cove with a  LIMA to LAD, vein to obtuse marginal branch and  Vein to the posterior lateral branch of the RCA. Her postoperative course was uncomplicated.   Since I saw her in the office a year and a half ago she remained stable.  She denies chest pain or shortness of breath.     Current Meds  Medication Sig   darunavir (PREZISTA) 800 MG tablet TAKE 1 TABLET(800 MG) BY MOUTH DAILY WITH BREAKFAST   elvitegravir-cobicistat-emtricitabine-tenofovir (GENVOYA) 150-150-200-10 MG TABS tablet Take 1 tablet by mouth daily with breakfast.   furosemide (LASIX) 40 MG tablet Take one tablet by mouth daily as needed for swelling   gemfibrozil (LOPID) 600 MG tablet TAKE 1 TABLET(600 MG) BY MOUTH TWICE DAILY BEFORE A MEAL   hydrOXYzine (ATARAX/VISTARIL) 10 MG tablet Take 1 tablet (10 mg total) by mouth 3 (three) times daily as needed.   meclizine (ANTIVERT) 25 MG tablet Take 1 tablet (25 mg  total) by mouth 3 (three) times daily as needed for dizziness.   metFORMIN (GLUCOPHAGE-XR) 500 MG 24 hr tablet Take 1 tablet (500 mg total) by mouth daily with breakfast.   metoprolol tartrate (LOPRESSOR) 25 MG tablet Take 1 and 1/2 tablets by mouth daily   rosuvastatin (CRESTOR) 10 MG tablet Take 1 tablet (10 mg total) by mouth daily.   Zoster Vaccine Adjuvanted [facility]) injection Inject 0.5 mLs into the muscle every 8 (eight) weeks.     No Known Allergies  Social History   Socioeconomic History   Marital status: Married    Spouse name: Not on file   Number of children: Not on file   Years of education: Not on file   Highest education level: Not on file  Occupational History   Not on file  Tobacco Use   Smoking status: Never   Smokeless tobacco: Never  Vaping Use   Vaping Use: Never used  Substance and Sexual Activity   Alcohol use: No    Alcohol/week: 0.0 standard drinks of alcohol   Drug use: No   Sexual activity: Yes    Partners: Male    Birth control/protection: Condom    Comment: accepted condoms  Other Topics Concern   Not on file  Social History Narrative   Not on file   Social Determinants of Health   Financial Resource Strain: Not on file  Food Insecurity: Not on file  Transportation Needs: Not on file  Physical  Activity: Not on file  Stress: Not on file  Social Connections: Not on file  Intimate Partner Violence: Not on file     Review of Systems: General: negative for chills, fever, night sweats or weight changes.  Cardiovascular: negative for chest pain, dyspnea on exertion, edema, orthopnea, palpitations, paroxysmal nocturnal dyspnea or shortness of breath Dermatological: negative for rash Respiratory: negative for cough or wheezing Urologic: negative for hematuria Abdominal: negative for nausea, vomiting, diarrhea, bright red blood per rectum, melena, or hematemesis Neurologic: negative for visual changes, syncope, or dizziness All other  systems reviewed and are otherwise negative except as noted above.    Blood pressure 104/68, pulse 65, height 5' 1"$  (1.549 m), weight 139 lb (63 kg), SpO2 99 %.  General appearance: alert and no distress Neck: no adenopathy, no carotid bruit, no JVD, supple, symmetrical, trachea midline, and thyroid not enlarged, symmetric, no tenderness/mass/nodules Lungs: clear to auscultation bilaterally Heart: regular rate and rhythm, S1, S2 normal, no murmur, click, rub or gallop Extremities: extremities normal, atraumatic, no cyanosis or edema Pulses: 2+ and symmetric Skin: Skin color, texture, turgor normal. No rashes or lesions Neurologic: Grossly normal  EKG sinus rhythm at 65 with poor R wave progression, low limb voltage and lateral T wave inversion.  I personally reviewed this EKG.  ASSESSMENT AND PLAN:   Essential hypertension History of essential hypertension a blood pressure measured today at 104/68.  She is on metoprolol.  Hypertriglyceridemia without hypercholesterolemia History of hyperlipidemia on Lopid and rosuvastatin with lipid profile performed 12/16/2022 revealing a total cholesterol of 119, LDL 58 and HDL 34.  S/P CABG x 3 History of CAD status post cardiac catheterization performed by myself 11/22/2017 after a Myoview stress test showed LAD territory ischemia.  She had left main/three-vessel disease and underwent CABG x 3 by Dr. Lilly Cove  11/25/2017 with a LIMA to her LAD, vein to obtuse marginal branch and to the posterolateral branch of the RCA.  Her postop course was uncomplicated.  She denies chest pain or shortness of breath.     Lorretta Harp MD FACP,FACC,FAHA, [facility] 12/25/2022 11:20 AM

## 2022-12-28 ENCOUNTER — Telehealth: Payer: Self-pay | Admitting: Nurse Practitioner

## 2022-12-28 NOTE — Telephone Encounter (Signed)
Pt is requesting a nurse call her about her metformin - she was just informed her metformin messes with her kidney [phone]

## 2022-12-30 ENCOUNTER — Ambulatory Visit
Admission: RE | Admit: 2022-12-30 | Discharge: 2022-12-30 | Disposition: A | Discharge status: Home / Self Care | Payer: Medicare PPO | Source: Ambulatory Visit | Attending: Nurse Practitioner | Admitting: Nurse Practitioner

## 2022-12-30 ENCOUNTER — Telehealth: Payer: Self-pay | Admitting: Nurse Practitioner

## 2022-12-30 DIAGNOSIS — Z1231 Encounter for screening mammogram for malignant neoplasm of breast: Secondary | ICD-10-CM | POA: Diagnosis not present

## 2022-12-30 NOTE — Telephone Encounter (Signed)
Contacted [name] to schedule their annual wellness visit. Appointment made for 01/07/2023.  Thank you,  [place] Direct dial  [phone]

## 2022-12-30 NOTE — Telephone Encounter (Signed)
We keep a check on her kidney function. Also her A1C was very good at last visit so we might be able to wean her off the metformin if numbers are still good at next visit. Stay well hydrated.

## 2022-12-31 ENCOUNTER — Telehealth: Payer: Self-pay | Admitting: Nurse Practitioner

## 2022-12-31 NOTE — Telephone Encounter (Signed)
Caller & Relationship to patient:  MRN #  [id number]   Call Back Number:   Date of Last Office Visit: 10/28/2022     Date of Next Office Visit: 12/16/2022    Medication(s) to be Refilled: Questions about metformin  Preferred Pharmacy:   ** Please notify patient to allow 48-72 hours to process** **Let patient know to contact pharmacy at the end of the day to make sure medication is ready. ** **If patient has not been seen in a year or longer, book an appointment **Advise to use MyChart for refill requests OR to contact their pharmacy

## 2022-12-31 NOTE — Telephone Encounter (Signed)
Patient LVM requesting call back from [place] or [place] regarding changing her Metformin [phone]

## 2023-01-04 ENCOUNTER — Other Ambulatory Visit: Payer: Self-pay | Admitting: Nurse Practitioner

## 2023-01-04 DIAGNOSIS — E119 Type 2 diabetes mellitus without complications: Secondary | ICD-10-CM

## 2023-01-04 NOTE — Telephone Encounter (Signed)
I have placed a referral for Catie to touch base with this patient regarding concerns. Thanks.

## 2023-01-05 ENCOUNTER — Ambulatory Visit (INDEPENDENT_AMBULATORY_CARE_PROVIDER_SITE_OTHER): Payer: Self-pay | Admitting: Clinical

## 2023-01-05 DIAGNOSIS — R413 Other amnesia: Secondary | ICD-10-CM

## 2023-01-05 DIAGNOSIS — R454 Irritability and anger: Secondary | ICD-10-CM

## 2023-01-05 DIAGNOSIS — F32A Depression, unspecified: Secondary | ICD-10-CM

## 2023-01-05 NOTE — BH Specialist Note (Signed)
Integrated Behavioral Health Initial In-Person Visit  MRN: [id number] Name: Cathy Smith  Number of [facility] Clinician visits: 1- Initial Visit  Session Start time: [id number]    Session End time: [id number]  Total time in minutes: 45   Types of Service: Individual psychotherapy  Interpretor:No. Interpretor Name and Language: none   Subjective: Cathy Smith is a 57 y.o. female accompanied by  spouse. Patient was referred by Lazaro Arms, NP for easily upset and irritated. Patient reports the following symptoms/concerns: easily upset and irritated Duration of problem: several months; Severity of problem: severe  Objective: Mood: Depressed and Affect: Appropriate Risk of harm to self or others: No plan to harm self or others  Life Context: Family and Social: lives with spouse, several other family members live in the area School/Work: not working Self-Care:  Life Changes: stopped working, death of her father  Patient and/or Family's Strengths/Protective Factors: Social Database administrator supports in place (healthy food, safe environments, etc.)  Goals Addressed: Patient will: Reduce symptoms of: depression and mood instability Increase knowledge and/or ability of: coping skills and self-management skills  Demonstrate ability to: Increase healthy adjustment to current life circumstances and Begin healthy grieving over loss  Progress towards Goals: Ongoing  Interventions: Interventions utilized: Supportive Counseling  Standardized Assessments completed: Not Needed  Patient was accompanied by her husband today. She and spouse report patient experiencing forgetfulness and irritability. Patient is grieving the recent loss of her father. Spouse reports that a few members of patient's family have dementia and they have concerns that patient may be experiencing an early onset of dementia. Supportive counseling and assessment today. Emotional validation and  reflective listening provided. Scheduled follow up [facility] appointment for further assessment, but will also coordinate with PCP regarding memory concerns.   Patient and/or Family Response: Patient engaged in session.   Patient Centered Plan: Patient is on the following Treatment Plan(s):  CBT to explore thoughts and emotions related to current stressors, continued assessment of cognitive changes  Assessment: Patient currently experiencing irritability, easy upset, and memory changes.   Patient may benefit from supportive counseling and coping skill development. She may also benefit from further evaluation by another specialist regarding her memory concerns.  Plan: Follow up with behavioral health clinician on: 01/21/23 Referral(s): [place] (In Clinic)  Estanislado Emms, LCSW

## 2023-01-07 ENCOUNTER — Ambulatory Visit: Payer: Medicare PPO

## 2023-01-07 VITALS — Ht 61.0 in | Wt 139.0 lb

## 2023-01-07 DIAGNOSIS — Z0000 Encounter for general adult medical examination without abnormal findings: Secondary | ICD-10-CM

## 2023-01-07 NOTE — Patient Instructions (Addendum)
Cathy Smith , Thank you for taking time to come for your Medicare Wellness Visit. I appreciate your ongoing commitment to your health goals. Please review the following plan we discussed and let me know if I can assist you in the future.   These are the goals we discussed:  Goals       Stay Healthy (pt-stated)        This is a list of the screening recommended for you and due dates:  Health Maintenance  Topic Date Due   Zoster (Shingles) Vaccine (1 of 2) Never done   Pap Smear  11/14/2022   Eye exam for diabetics  01/07/2023*   Yearly kidney health urinalysis for diabetes  01/08/2023*   Complete foot exam   01/08/2023*   COVID-19 Vaccine (6 - 2023-24 season) 01/23/2023*   Hemoglobin A1C  06/16/2023   Yearly kidney function blood test for diabetes  12/17/2023   Medicare Annual Wellness Visit  01/07/2024   DTaP/Tdap/Td vaccine (2 - Td or Tdap) 09/06/2024   Mammogram  12/30/2024   Colon Cancer Screening  01/27/2025   Flu Shot  Completed   Hepatitis C Screening: USPSTF Recommendation to screen - Ages 18-79 yo.  Completed   HIV Screening  Completed   HPV Vaccine  Aged Out  *Topic was postponed. The date shown is not the original due date.    Advanced directives: Advance directive discussed with you today. Even though you declined this today, please call our office should you change your mind, and we can give you the proper paperwork for you to fill out.   Conditions/risks identified: None  Next appointment: Follow up in one year for your annual wellness visit.    Preventive Care 40-64 Years, Female Preventive care refers to lifestyle choices and visits with your health care provider that can promote health and wellness. What does preventive care include? A yearly physical exam. This is also called an annual well check. Dental exams once or twice a year. Routine eye exams. Ask your health care provider how often you should have your eyes checked. Personal lifestyle choices,  including: Daily care of your teeth and gums. Regular physical activity. Eating a healthy diet. Avoiding tobacco and drug use. Limiting alcohol use. Practicing safe sex. Taking low-dose aspirin daily starting at age 42. Taking vitamin and mineral supplements as recommended by your health care provider. What happens during an annual well check? The services and screenings done by your health care provider during your annual well check will depend on your age, overall health, lifestyle risk factors, and family history of disease. Counseling  Your health care provider may ask you questions about your: Alcohol use. Tobacco use. Drug use. Emotional well-being. Home and relationship well-being. Sexual activity. Eating habits. Work and work Statistician. Method of birth control. Menstrual cycle. Pregnancy history. Screening  You may have the following tests or measurements: Height, weight, and BMI. Blood pressure. Lipid and cholesterol levels. These may be checked every 5 years, or more frequently if you are over 4 years old. Skin check. Lung cancer screening. You may have this screening every year starting at age 18 if you have a 30-pack-year history of smoking and currently smoke or have quit within the past 15 years. Fecal occult blood test (FOBT) of the stool. You may have this test every year starting at age 26. Flexible sigmoidoscopy or colonoscopy. You may have a sigmoidoscopy every 5 years or a colonoscopy every 10 years starting at age 35. Hepatitis C  blood test. Hepatitis B blood test. Sexually transmitted disease (STD) testing. Diabetes screening. This is done by checking your blood sugar (glucose) after you have not eaten for a while (fasting). You may have this done every 1-3 years. Mammogram. This may be done every 1-2 years. Talk to your health care provider about when you should start having regular mammograms. This may depend on whether you have a family history of  breast cancer. BRCA-related cancer screening. This may be done if you have a family history of breast, ovarian, tubal, or peritoneal cancers. Pelvic exam and Pap test. This may be done every 3 years starting at age 63. Starting at age 31, this may be done every 5 years if you have a Pap test in combination with an HPV test. Bone density scan. This is done to screen for osteoporosis. You may have this scan if you are at high risk for osteoporosis. Discuss your test results, treatment options, and if necessary, the need for more tests with your health care provider. Vaccines  Your health care provider may recommend certain vaccines, such as: Influenza vaccine. This is recommended every year. Tetanus, diphtheria, and acellular pertussis (Tdap, Td) vaccine. You may need a Td booster every 10 years. Zoster vaccine. You may need this after age 9. Pneumococcal 13-valent conjugate (PCV13) vaccine. You may need this if you have certain conditions and were not previously vaccinated. Pneumococcal polysaccharide (PPSV23) vaccine. You may need one or two doses if you smoke cigarettes or if you have certain conditions. Talk to your health care provider about which screenings and vaccines you need and how often you need them. This information is not intended to replace advice given to you by your health care provider. Make sure you discuss any questions you have with your health care provider. Document Released: 11/22/2015 Document Revised: 07/15/2016 Document Reviewed: 08/27/2015 Elsevier Interactive Patient Education  2017 [place] Prevention in the Home Falls can cause injuries. They can happen to people of all ages. There are many things you can do to make your home safe and to help prevent falls. What can I do on the outside of my home? Regularly fix the edges of walkways and driveways and fix any cracks. Remove anything that might make you trip as you walk through a door, such as a  raised step or threshold. Trim any bushes or trees on the path to your home. Use bright outdoor lighting. Clear any walking paths of anything that might make someone trip, such as rocks or tools. Regularly check to see if handrails are loose or broken. Make sure that both sides of any steps have handrails. Any raised decks and porches should have guardrails on the edges. Have any leaves, snow, or ice cleared regularly. Use sand or salt on walking paths during winter. Clean up any spills in your garage right away. This includes oil or grease spills. What can I do in the bathroom? Use night lights. Install grab bars by the toilet and in the tub and shower. Do not use towel bars as grab bars. Use non-skid mats or decals in the tub or shower. If you need to sit down in the shower, use a plastic, non-slip stool. Keep the floor dry. Clean up any water that spills on the floor as soon as it happens. Remove soap buildup in the tub or shower regularly. Attach bath mats securely with double-sided non-slip rug tape. Do not have throw rugs and other things on the  floor that can make you trip. What can I do in the bedroom? Use night lights. Make sure that you have a light by your bed that is easy to reach. Do not use any sheets or blankets that are too big for your bed. They should not hang down onto the floor. Have a firm chair that has side arms. You can use this for support while you get dressed. Do not have throw rugs and other things on the floor that can make you trip. What can I do in the kitchen? Clean up any spills right away. Avoid walking on wet floors. Keep items that you use a lot in easy-to-reach places. If you need to reach something above you, use a strong step stool that has a grab bar. Keep electrical cords out of the way. Do not use floor polish or wax that makes floors slippery. If you must use wax, use non-skid floor wax. Do not have throw rugs and other things on the floor  that can make you trip. What can I do with my stairs? Do not leave any items on the stairs. Make sure that there are handrails on both sides of the stairs and use them. Fix handrails that are broken or loose. Make sure that handrails are as long as the stairways. Check any carpeting to make sure that it is firmly attached to the stairs. Fix any carpet that is loose or worn. Avoid having throw rugs at the top or bottom of the stairs. If you do have throw rugs, attach them to the floor with carpet tape. Make sure that you have a light switch at the top of the stairs and the bottom of the stairs. If you do not have them, ask someone to add them for you. What else can I do to help prevent falls? Wear shoes that: Do not have high heels. Have rubber bottoms. Are comfortable and fit you well. Are closed at the toe. Do not wear sandals. If you use a stepladder: Make sure that it is fully opened. Do not climb a closed stepladder. Make sure that both sides of the stepladder are locked into place. Ask someone to hold it for you, if possible. Clearly mark and make sure that you can see: Any grab bars or handrails. First and last steps. Where the edge of each step is. Use tools that help you move around (mobility aids) if they are needed. These include: Canes. Walkers. Scooters. Crutches. Turn on the lights when you go into a dark area. Replace any light bulbs as soon as they burn out. Set up your furniture so you have a clear path. Avoid moving your furniture around. If any of your floors are uneven, fix them. If there are any pets around you, be aware of where they are. Review your medicines with your doctor. Some medicines can make you feel dizzy. This can increase your chance of falling. Ask your doctor what other things that you can do to help prevent falls. This information is not intended to replace advice given to you by your health care provider. Make sure you discuss any questions you  have with your health care provider. Document Released: 08/22/2009 Document Revised: 04/02/2016 Document Reviewed: 11/30/2014 Elsevier Interactive Patient Education  2017 [employer].

## 2023-01-07 NOTE — Progress Notes (Addendum)
Subjective:   Cathy Smith is a 57 y.o. female who presents for Medicare Annual (Subsequent) preventive examination.  Review of Systems    Virtual Visit via Telephone Note  I connected with  Cathy Smith on 01/07/23 at  9:45 AM EST by telephone and verified that I am speaking with the correct person using two identifiers.  Location: Patient: Home Provider: Office Persons participating in the virtual visit: patient/Nurse Health Advisor   I discussed the limitations, risks, security and privacy concerns of performing an evaluation and management service by telephone and the availability of in person appointments. The patient expressed understanding and agreed to proceed.  Interactive audio and video telecommunications were attempted between this nurse and patient, however failed, due to patient having technical difficulties OR patient did not have access to video capability.  We continued and completed visit with audio only.  Some vital signs may be absent or patient reported.   Criselda Peaches, LPN  Cardiac Risk Factors include: advanced age (>80mn, >>30women);diabetes mellitus;hypertension     Objective:    Today's Vitals   01/07/23 0930  Weight: 139 lb (63 kg)  Height: '5\' 1"'$  (1.549 m)   Body mass index is 26.26 kg/m.     01/07/2023    9:38 AM 10/16/2022   11:58 AM 02/06/2020   11:38 AM 11/06/2019    3:27 PM 01/18/2019   12:10 PM 12/27/2018    9:24 AM 12/13/2018    1:10 PM  Advanced Directives  Does Patient Have a Medical Advance Directive? No No No No No No No  Would patient like information on creating a medical advance directive? No - Patient declined Yes (MAU/Ambulatory/Procedural Areas - Information given) No - Patient declined No - Patient declined No - Patient declined No - Patient declined No - Patient declined    Current Medications (verified) Outpatient Encounter Medications as of 01/07/2023  Medication Sig   darunavir (PREZISTA) 800 MG tablet TAKE 1  TABLET(800 MG) BY MOUTH DAILY WITH BREAKFAST   elvitegravir-cobicistat-emtricitabine-tenofovir (GENVOYA) 150-150-200-10 MG TABS tablet Take 1 tablet by mouth daily with breakfast.   furosemide (LASIX) 40 MG tablet Take one tablet by mouth daily as needed for swelling   gemfibrozil (LOPID) 600 MG tablet TAKE 1 TABLET(600 MG) BY MOUTH TWICE DAILY BEFORE A MEAL   hydrOXYzine (ATARAX/VISTARIL) 10 MG tablet Take 1 tablet (10 mg total) by mouth 3 (three) times daily as needed.   meclizine (ANTIVERT) 25 MG tablet Take 1 tablet (25 mg total) by mouth 3 (three) times daily as needed for dizziness.   metFORMIN (GLUCOPHAGE-XR) 500 MG 24 hr tablet Take 1 tablet (500 mg total) by mouth daily with breakfast.   metoprolol tartrate (LOPRESSOR) 25 MG tablet Take 1 and 1/2 tablets by mouth daily   rosuvastatin (CRESTOR) 10 MG tablet Take 1 tablet (10 mg total) by mouth daily.   Zoster Vaccine Adjuvanted ([facility] injection Inject 0.5 mLs into the muscle every 8 (eight) weeks.   [DISCONTINUED] atorvastatin (LIPITOR) 20 MG tablet TAKE 1 TABLET(20 MG) BY MOUTH DAILY AT 6 PM   No facility-administered encounter medications on file as of 01/07/2023.    Allergies (verified) Patient has no known allergies.   History: Past Medical History:  Diagnosis Date   Coronary artery disease    History of condyloma acuminatum    History of uterine fibroid    History of vaginal dysplasia    VAIN III--  oncologist-  dr rDenman George  History of vulvar dysplasia  VIN III   gyn-oncologist-  dr Denman George   HIV infection [facility]) montiored by [facility]-  dr hatcher   Hypertension    Hypertriglyceridemia without hypercholesterolemia    PONV (postoperative nausea and vomiting)    Type 2 diabetes mellitus without complication, without long-term current use of insulin ([place]) 09/02/2022   VIN III (vulvar intraepithelial neoplasia III)    Wears glasses    Past Surgical History:  Procedure Laterality Date   ABDOMINAL  HYSTERECTOMY  1995 approx   ANTERIOR CERVICAL DECOMP/DISCECTOMY FUSION  11-30-2009   C4 -- C6   CO2 LASER APPLICATION Bilateral [id number]   Procedure: BILATERAL CO2 LASER ABLATION OF THE VULVA;  Surgeon: Everitt Amber, MD;  Location: [place];  Service: Gynecology;  Laterality: Bilateral;   CO2 LASER APPLICATION N/A [id number]   Procedure: CO2 LASER OF THE VULVAR;  Surgeon: Everitt Amber, MD;  Location: [facility];  Service: Gynecology;  Laterality: N/A;   CO2 LASER APPLICATION N/A [id number]   Procedure: CO2 LASER VAPORIZATION OF THE VULVA;  Surgeon: Everitt Amber, MD;  Location: [place];  Service: Gynecology;  Laterality: N/A;   CO2 LASER APPLICATION N/A [id number]   Procedure: CO2 LASER APPLICATION OF VULVA;  Surgeon: Everitt Amber, MD;  Location: [facility];  Service: Gynecology;  Laterality: N/A;   COLONOSCOPY  01-28-2015   CORONARY ARTERY BYPASS GRAFT N/A 11/25/2017   Procedure: CORONARY ARTERY BYPASS GRAFTING (CABG), ON PUMP, TIMES THREE, USING LEFT INTERNAL MAMMARY ARTERY AND ENDOSCOPICALLY HARVESTED LEFT GREATER SAPHENOUS VEIN;  Surgeon: Rexene Alberts, MD;  Location: [place];  Service: Open Heart Surgery;  Laterality: N/A;   I & D LEFT BUTTOCK ABSCESS  04-28-2001   LEFT HEART CATH AND CORONARY ANGIOGRAPHY N/A 11/22/2017   Procedure: LEFT HEART CATH AND CORONARY ANGIOGRAPHY;  Surgeon: Lorretta Harp, MD;  Location: [place] [facility];  Service: Cardiovascular;  Laterality: N/A;   TEE WITHOUT CARDIOVERSION N/A 11/25/2017   Procedure: TRANSESOPHAGEAL ECHOCARDIOGRAM (TEE);  Surgeon: Rexene Alberts, MD;  Location: [place];  Service: Open Heart Surgery;  Laterality: N/A;   VULVECTOMY Right 05/06/2015   Procedure: WIDE LOCAL  EXCISION  OF RIGHT VULVA;  Surgeon: Everitt Amber, MD;  Location: [place];  Service: Gynecology;  Laterality: Right;   VULVECTOMY N/A 05/04/2017   Procedure: WIDE EXCISION VULVECTOMY;  Surgeon: Everitt Amber, MD;  Location: [facility];  Service: Gynecology;  Laterality: N/A;   VULVECTOMY N/A 12/27/2018   Procedure: WIDE EXCISION VULVECTOMY;  Surgeon: Everitt Amber, MD;  Location: [facility];  Service: Gynecology;  Laterality: N/A;   Family History  Problem Relation Age of Onset   Cataracts Mother    Hypertension Father    CAD Paternal Grandfather    Colon cancer Neg Hx    Social History   Socioeconomic History   Marital status: Married    Spouse name: Not on file   Number of children: Not on file   Years of education: Not on file   Highest education level: Not on file  Occupational History   Not on file  Tobacco Use   Smoking status: Never   Smokeless tobacco: Never  Vaping Use   Vaping Use: Never used  Substance and Sexual Activity   Alcohol use: No    Alcohol/week: 0.0 standard drinks of alcohol   Drug use: No   Sexual activity: Yes    Partners: Male    Birth control/protection:  Condom    Comment: accepted condoms  Other Topics Concern   Not on file  Social History Narrative   Not on file   Social Determinants of Health   Financial Resource Strain: Low Risk  (01/07/2023)   Overall Financial Resource Strain (CARDIA)    Difficulty of Paying Living Expenses: Not hard at all  Food Insecurity: No Food Insecurity (01/07/2023)   Hunger Vital Sign    Worried About Running Out of Food in the Last Year: Never true    Ran Out of Food in the Last Year: Never true  Transportation Needs: No Transportation Needs (01/07/2023)   PRAPARE - Hydrologist (Medical): No    Lack of Transportation (Non-Medical): No  Physical Activity: Inactive (01/07/2023)   Exercise Vital Sign    Days of Exercise per Week: 0 days    Minutes of Exercise per Session: 0 min  Stress: No Stress Concern Present (01/07/2023)   [place]    Feeling of Stress : Not at all  Social  Connections: [place] (01/07/2023)   Social Connection and Isolation Panel [NHANES]    Frequency of Communication with Friends and Family: More than three times a week    Frequency of Social Gatherings with Friends and Family: More than three times a week    Attends Religious Services: More than 4 times per year    Active Member of [employer] or Organizations: Yes    Attends Music therapist: More than 4 times per year    Marital Status: Married    Tobacco Counseling Counseling given: Not Answered   Clinical Intake:  Pre-visit preparation completed: No  Pain : No/denies painNutrition Risk Assessment:  Has the patient had any N/V/D within the last 2 months?  No  Does the patient have any non-healing wounds?  No  Has the patient had any unintentional weight loss or weight gain?  No   Diabetes:  Is the patient diabetic?  Yes  If diabetic, was a CBG obtained today?  No  Did the patient bring in their glucometer from home?  No  How often do you monitor your CBG's? 3X weekly.   Financial Strains and Diabetes Management:  Are you having any financial strains with the device, your supplies or your medication? No .  Does the patient want to be seen by Chronic Care Management for management of their diabetes?  No  Would the patient like to be referred to a Nutritionist or for Diabetic Management?  No   Diabetic Exams:  Diabetic Eye Exam: Completed No. Overdue for diabetic eye exam. Pt has been advised about the importance in completing this exam. A referral has been placed today. Message sent to referral coordinator for scheduling purposes. Advised pt to expect a call from office referred to regarding appt.  Diabetic Foot Exam: Completed No. Pt has been advised about the importance in completing this exam. Pt is scheduled for diabetic foot exam on Followed by PCP.       BMI - recorded: 26.26 Nutritional Status: BMI 25 -29 Overweight Nutritional Risks:  None Diabetes: Yes CBG done?: No Did pt. bring in CBG monitor from home?: No  How often do you need to have someone help you when you read instructions, pamphlets, or other written materials from your doctor or pharmacy?: 1 - Never  Diabetic? Yes  Interpreter Needed?: No  Information entered by :: Rolene Arbour LPN   Activities  of Daily Living    01/07/2023    9:37 AM  In your present state of health, do you have any difficulty performing the following activities:  Hearing? 0  Vision? 0  Difficulty concentrating or making decisions? 0  Walking or climbing stairs? 0  Dressing or bathing? 0  Doing errands, shopping? 0  Preparing Food and eating ? N  Using the Toilet? N  In the past six months, have you accidently leaked urine? N  Do you have problems with loss of bowel control? N  Managing your Medications? N  Managing your Finances? N  Housekeeping or managing your Housekeeping? N    Patient Care Team: Fenton Foy, NP as PCP - General (Pulmonary Disease) Lorretta Harp, MD as PCP - Cardiology (Cardiology) Everitt Amber, MD as Consulting Physician (Obstetrics and Gynecology)  Indicate any recent Medical Services you may have received from other than [facility] providers in the past year (date may be approximate).     Assessment:   This is a routine wellness examination for Cathy Smith.  Hearing/Vision screen Hearing Screening - Comments:: Denies hearing difficulties   Vision Screening - Comments:: Wears rx glasses - up to date with routine eye exams with  [facility]  Dietary issues and exercise activities discussed: Exercise limited by: None identified   Goals Addressed               This Visit's Progress     Stay Healthy (pt-stated)         Depression Screen    01/07/2023    9:36 AM 12/16/2022   10:19 AM 12/16/2022   10:18 AM 10/19/2022    9:49 AM 10/16/2022   11:58 AM 04/07/2022    9:56 AM 03/03/2022    2:37 PM  PHQ 2/9 Scores  PHQ - 2 Score 0 2 0 0 0 1 0   PHQ- 9 Score 0 6         Fall Risk    01/07/2023    9:37 AM 10/19/2022    9:49 AM 10/16/2022   11:58 AM 04/07/2022    9:57 AM 03/03/2022    2:36 PM  Fall Risk   Falls in the past year? 0 0 0 0 0  Number falls in past yr: 0 0   0  Injury with Fall? 0 0   0  Risk for fall due to : No Fall Risks   No Fall Risks No Fall Risks  Follow up Falls prevention discussed   Falls evaluation completed Falls evaluation completed    [place]:  Any stairs in or around the home? Yes  If so, are there any without handrails? No  Home free of loose throw rugs in walkways, pet beds, electrical cords, etc? Yes  Adequate lighting in your home to reduce risk of falls? Yes   ASSISTIVE DEVICES UTILIZED TO PREVENT FALLS:  Life alert? No  Use of a cane, walker or w/c? No  Grab bars in the bathroom? No  Shower chair or bench in shower? No  Elevated toilet seat or a handicapped toilet? No   TIMED UP AND GO:  Was the test performed? No . Audio Visit  Cognitive Function:        01/07/2023    9:38 AM  6CIT Screen  What Year? 0 points  What month? 0 points  What time? 0 points  Count back from 20 0 points  Months in reverse 0 points  Repeat  phrase 0 points  Total Score 0 points    Immunizations Immunization History  Administered Date(s) Administered   COVID-19, mRNA, vaccine(Comirnaty)12 years and older 08/26/2022   H1N1 10/18/2008   Hepatitis B 01/13/2001, 02/02/2001, 03/02/2001, 11/16/2001   Hepatitis B, ADULT 02/05/2014, 08/08/2014, 02/11/2015   Influenza Split 09/02/2011, 12/20/2012   Influenza Whole 08/30/2006, 08/03/2007, 09/24/2008, 08/07/2009, 09/11/2010   Influenza,inj,Quad PF,6+ Mos 08/02/2013, 08/08/2014, 08/27/2015, 08/19/2016, 09/20/2017, 08/15/2018, 08/16/2019, 08/12/2020, 08/04/2021, 08/26/2022   Meningococcal Mcv4o 04/21/2017   PFIZER(Purple Top)SARS-COV-2 Vaccination 01/25/2020, 02/19/2020, 08/30/2020   PNEUMOCOCCAL CONJUGATE-20  04/07/2022   Pfizer Covid-19 Vaccine Bivalent Booster 79yr & up 08/04/2021   Pneumococcal Conjugate-13 01/13/2016   Pneumococcal Polysaccharide-23 08/30/2006, 12/31/2010   Tdap 09/06/2014    TDAP status: Up to date  Flu Vaccine status: Up to date    Covid-19 vaccine status: Completed vaccines  Qualifies for Shingles Vaccine? Yes   Zostavax completed No   Shingrix Completed?: No.    Education has been provided regarding the importance of this vaccine. Patient has been advised to call insurance company to determine out of pocket expense if they have not yet received this vaccine. Advised may also receive vaccine at local pharmacy or Health Dept. Verbalized acceptance and understanding.  Screening Tests Health Maintenance  Topic Date Due   Zoster Vaccines- Shingrix (1 of 2) Never done   PAP SMEAR-Modifier  11/14/2022   OPHTHALMOLOGY EXAM  01/07/2023 (Originally 11/17/1975)   Diabetic kidney evaluation - Urine ACR  01/08/2023 (Originally 11/17/1983)   FOOT EXAM  01/08/2023 (Originally 11/17/1975)   COVID-19 Vaccine (6 - 2023-24 season) 01/23/2023 (Originally 10/21/2022)   HEMOGLOBIN A1C  06/16/2023   Diabetic kidney evaluation - eGFR measurement  12/17/2023   Medicare Annual Wellness (AWV)  01/07/2024   DTaP/Tdap/Td (2 - Td or Tdap) 09/06/2024   MAMMOGRAM  12/30/2024   COLONOSCOPY (Pts 45-414yrInsurance coverage will need to be confirmed)  01/27/2025   INFLUENZA VACCINE  Completed   Hepatitis C Screening  Completed   HIV Screening  Completed   HPV VACCINES  Aged Out    Health Maintenance  Health Maintenance Due  Topic Date Due   Zoster Vaccines- Shingrix (1 of 2) Never done   PAP SMEAR-Modifier  11/14/2022    Colorectal cancer screening: Type of screening: Colonoscopy. Completed 01/28/15. Repeat every 10 years  Mammogram status: Completed 12/30/22. Repeat every year 2y48yr  Lung Cancer Screening: (Low Dose CT Chest recommended if Age 71-55-80ars, 30 pack-year currently  smoking OR have quit w/in 15years.) does not qualify.     Additional Screening:  Hepatitis C Screening: does qualify; Completed 02/19/20  Vision Screening: Recommended annual ophthalmology exams for early detection of glaucoma and other disorders of the eye. Is the patient up to date with their annual eye exam?  Yes  Who is the provider or what is the name of the office in which the patient attends annual eye exams? [facility] If pt is not established with a provider, would they like to be referred to a provider to establish care? No .   Dental Screening: Recommended annual dental exams for proper oral hygiene  Community Resource Referral / Chronic Care Management:  CRR required this visit?  No   CCM required this visit?  No      Plan:     I have personally reviewed and noted the following in the patient's chart:   Medical and social history Use of alcohol, tobacco or illicit drugs  Current medications and supplements  including opioid prescriptions. Patient is not currently taking opioid prescriptions. Functional ability and status Nutritional status Physical activity Advanced directives List of other physicians Hospitalizations, surgeries, and ER visits in previous 12 months Vitals Screenings to include cognitive, depression, and falls Referrals and appointments  In addition, I have reviewed and discussed with patient certain preventive protocols, quality metrics, and best practice recommendations. A written personalized care plan for preventive services as well as general preventive health recommendations were provided to patient.     Criselda Peaches, LPN   [id number]   Nurse Notes: Patient due Diabetic kidney evaluation - Urine ACR

## 2023-01-11 ENCOUNTER — Encounter: Payer: Medicare HMO | Admitting: Dietician

## 2023-01-12 ENCOUNTER — Encounter: Payer: Medicare PPO | Attending: Nurse Practitioner | Admitting: Dietician

## 2023-01-12 ENCOUNTER — Encounter: Payer: Self-pay | Admitting: Dietician

## 2023-01-12 VITALS — Wt 137.0 lb

## 2023-01-12 DIAGNOSIS — E119 Type 2 diabetes mellitus without complications: Secondary | ICD-10-CM | POA: Diagnosis not present

## 2023-01-12 NOTE — Patient Instructions (Addendum)
Have your vitamin B-12 checked periodically.  1/2 your plate should be non-starchy vegetables Lean protein Less sweets.    Aim to be active most days for 30 minutes.

## 2023-01-12 NOTE — Progress Notes (Signed)
Appointment start:  1045 Appointment End:  1120 Patient is here today with her husband.  Patient attended Diabetes Core Classes 1-3 between 10/13/2022 and 10/20/2022 at [facility].  She missed the third class due to illness. The purpose of the meeting today is to review information learned during those classes as well as review patient application and goals.   What are one or two positive things that you are doing right now to manage your diabetes?  Taking my medication consistently now..  Changed beverages  What is the hardest part about your diabetes right now, causing you the most concern, or is the most worrisome to you about your diabetes?  Changes to make  What questions do you have today?  About sweets and navigating what is in the house her husband  Have you participated in any diabetes support group?  no  History:  Type 2 Diabetes, HIV, HLD, HTN, CAD, neuropathy  A1C:  5.8% 12/16/2022 decreased from 6.8% 03/03/2022 Medications include:  Metformin XR Sleep:   Weight:   137 lbs 01/12/2023 142 lbs 10/13/2022  Blood Glucose:  patient is not testing Social History:  Lives with her husband. Goes to the food bank at times.  She does not work. Exercise:  Membership to the gym but has not gone due to concerns of increased covid rates, feels unsafe walking in her neighborhood, does not like the cold.  Decreased motivation to exercise.  24 hour diet recall: Breakfast:  skips often or oatmeal Snack:   Lunch:  cheeseburger and small fry Snack:  none Dinner:  steak-um with cheese on sub roll and cheese stick Snack:  2 cups sugar free ice cream OR sandwich OR chips Beverages:  water, water with lemon, coffee with cream and splenda, tea with splenda   Specific focus but not limited to the following: Review of blood glucose monitoring and interpretation including the recommended target ranges and Hgb A1c.  Review of carbohydrate counting, importance of regularly  scheduled meals/snacks, and meal planning to improve quality of diet. Review of the effects of physical activity on glucose levels and long-term glucose control.  Recommended goal of 150 minutes of physical activity/week. Review of patient medications and discussed role of medication on blood glucose and possible side effects. Discussion of strategies to manage stress, psychosocial issues, and other obstacles to diabetes management. Review of short-term complications: hyper- and hypo-glycemia (causes, symptoms, and treatment options) Review of prevention, detection, and treatment of long-term complications.  Discussion of the role of prolonged elevated glucose levels on body systems.  Continuing Goals: Have your vitamin B-12 checked periodically.  1/2 your plate should be non-starchy vegetables Lean protein Less sweets.    Aim to be active most days for 30 minutes.  Handout:  Heart Healthy Carbohydrate Consistent Nutrition Therapy  Future Follow up:  3 months

## 2023-01-13 NOTE — Telephone Encounter (Signed)
Called pt and inform provider message.

## 2023-01-21 ENCOUNTER — Ambulatory Visit (INDEPENDENT_AMBULATORY_CARE_PROVIDER_SITE_OTHER): Payer: Self-pay | Admitting: Clinical

## 2023-01-21 DIAGNOSIS — F32A Depression, unspecified: Secondary | ICD-10-CM

## 2023-01-21 DIAGNOSIS — R454 Irritability and anger: Secondary | ICD-10-CM

## 2023-01-21 NOTE — BH Specialist Note (Signed)
Integrated Behavioral Health Follow Up In-Person Visit  MRN: [id number] Name: KIYLA POTTHAST  Number of [place] Clinician visits: 2- Second Visit  Session Start time: [id number]   Session End time: [id number]  Total time in minutes: 40   Types of Service: Individual psychotherapy  Interpretor:No. Interpretor Name and Language: none  Subjective: ZOHAR PIGUE is a 57 y.o. female accompanied by  self. Patient was referred by Lazaro Arms, NP for easily upset and irritated. Patient reports the following symptoms/concerns: easily upset and irritated Duration of problem: several months; Severity of problem: severe  Objective: Mood: Euthymic and Affect: Appropriate Risk of harm to self or others: No plan to harm self or others  Patient and/or Family's Strengths/Protective Factors: Social connections and Concrete supports in place (healthy food, safe environments, etc.)   Goals Addressed: Patient will:  Reduce symptoms of: depression and mood instability Increase knowledge and/or ability of: coping skills and self-management skills  Demonstrate ability to: Increase healthy adjustment to current life circumstances and Begin healthy grieving over loss  Progress towards Goals: Ongoing  Interventions: Interventions utilized:  Solution-Focused Strategies and Supportive Counseling Standardized Assessments completed: PHQ 9     01/21/2023   11:45 AM 01/07/2023    9:36 AM 12/16/2022   10:19 AM 12/16/2022   10:18 AM 10/19/2022    9:49 AM  Depression screen PHQ 2/9  Decreased Interest 2 0 1 0 0  Down, Depressed, Hopeless 2 0 1 0 0  PHQ - 2 Score 4 0 2 0 0  Altered sleeping 0 0 0    Tired, decreased energy 1 0 2    Change in appetite 0      Feeling bad or failure about yourself  1 0 0    Trouble concentrating 1 0 0    Moving slowly or fidgety/restless 0 0 2    Suicidal thoughts 0 0 0    PHQ-9 Score 7 0 6    Difficult doing work/chores  Not difficult at all Somewhat  difficult      Patient reported that she feels down when she is just at home most of the day, and is interested in finding some activities to do. She is interested in volunteering, maybe with animals or children, or maybe doing some light part time work like greeting at [employer]. Discussed these with patient and encouraged her to look into these. Discussed how inactivity can be associated with decreased mood and irritability, as well as memory changes. Provided patient with information on volunteer options at schools and animal shelters. Completed PHQ9; patient's score is fairly low, though patient expressed an interest in medication to help with depression.   Patient and/or Family Response: Patient engaged in session.   Assessment: Patient currently experiencing irritability, easy upset, and memory changes.   Patient may benefit from supportive counseling and coping skill development. She may also benefit from further evaluation by another specialist regarding her memory concerns.  Plan: Follow up with behavioral health clinician on: 02/08/23 Behavioral recommendations: look into volunteer and part time work opportunities  Estanislado Emms, [place]

## 2023-01-27 ENCOUNTER — Other Ambulatory Visit: Payer: Self-pay | Admitting: Nurse Practitioner

## 2023-01-27 ENCOUNTER — Telehealth: Payer: Self-pay

## 2023-01-27 NOTE — Telephone Encounter (Signed)
pharmacy on the line asking about her metoprolol tartrate. Pt  script changed at last vist to  QD and they want to make sure you dont want her to tak BID and this is  a BID script. Please advise . And I will call them back  [phone] stephanie.

## 2023-02-01 ENCOUNTER — Other Ambulatory Visit: Payer: Medicare PPO | Admitting: Pharmacist

## 2023-02-01 DIAGNOSIS — I10 Essential (primary) hypertension: Secondary | ICD-10-CM

## 2023-02-01 MED ORDER — METOPROLOL TARTRATE 25 MG PO TABS: 37.5000 mg | ORAL_TABLET | Freq: Two times a day (BID) | ORAL | 1 refills | Status: DC

## 2023-02-01 NOTE — Patient Instructions (Signed)
Mattye,   Keep up the great work!  Check your blood pressure periodically, and any time you have concerning symptoms like headache, chest pain, dizziness, shortness of breath, or vision changes.   Our goal is less than 130/80.  To appropriately check your blood pressure, make sure you do the following:  1) Avoid caffeine, exercise, or tobacco products for 30 minutes before checking. Empty your bladder. 2) Sit with your back supported in a flat-backed chair. Rest your arm on something flat (arm of the chair, table, etc). 3) Sit still with your feet flat on the floor, resting, for at least 5 minutes.  4) Check your blood pressure. Take 1-2 readings.  5) Write down these readings and bring with you to any provider appointments.  Bring your home blood pressure machine with you to a provider's office for accuracy comparison at least once a year.   Make sure you take your blood pressure medications before you come to any office visit, even if you were asked to fast for labs.   [name], PharmD, [place], [facility] [phone]

## 2023-02-01 NOTE — Progress Notes (Signed)
02/01/2023 Name: Cathy Smith MRN: [id number] DOB: [birth year]  Chief Complaint  Patient presents with   Medication Management   Hypertension   Diabetes   Hyperlipidemia    Cathy Smith is a 57 y.o. year old female who presented for a telephone visit.   They were referred to the pharmacist by their PCP for assistance in managing complex medication management.   Subjective:  Care Team: Primary Care Provider: Fenton Foy, NP ; Next Scheduled Visit: 3/28  Medication Access/Adherence  Current Pharmacy:  [facility] [place], [address] [place] [address] Phone: [phone] Fax: [phone]  [facility] [phone] [place], [place] [address] Phone: [phone] Fax: [phone]   Patient reports affordability concerns with their medications: No  Patient reports access/transportation concerns to their pharmacy: No  Patient reports adherence concerns with their medications:  No     Diabetes:  Current medications: metformin XR 500 mg daily   Does note that she has been craving sweets more frequently, but her husband has endorsed moderation.   Hypertension:  Current medications: metoprolol tartrate 37.5 mg twice daily - though has been written for once daily, though should be twice daily  Patient has a validated, automated, upper arm home BP cuff  Hyperlipidemia/ASCVD Risk Reduction  Current lipid lowering medications: rosuvastatin 10 mg daily  Objective:  Lab Results  Component Value Date   HGBA1C 5.8 (A) 12/16/2022    Lab Results  Component Value Date   CREATININE 0.97 12/16/2022   BUN 8 12/16/2022   NA 143 12/16/2022   K 3.4 (L) 12/16/2022   CL 99 12/16/2022   CO2 28 12/16/2022    Lab Results  Component Value Date   CHOL 119 12/16/2022   HDL 34 (L) 12/16/2022   LDLCALC 58 12/16/2022    TRIG 159 (H) 12/16/2022   CHOLHDL 3.5 12/16/2022    Medications Reviewed Today     Reviewed by Osker Mason, RPH-CPP (Pharmacist) on 02/01/23 at 1101  Med List Status: <None>   Medication Order Taking? Sig Documenting Provider Last Dose Status Informant    Discontinued 11/27/20 1507 (Dose change)            Med Note Jodi Mourning, Cloud Graham T   Tue Sep 29, 2022 10:32 AM)    darunavir (PREZISTA) 800 MG tablet [id number] Yes TAKE 1 TABLET(800 MG) BY MOUTH DAILY WITH BREAKFAST [place] Callas, NP Taking Active   elvitegravir-cobicistat-emtricitabine-tenofovir (GENVOYA) 150-150-200-10 MG TABS tablet [id number] Yes Take 1 tablet by mouth daily with breakfast. [place] Callas, NP Taking Active   furosemide (LASIX) 40 MG tablet [id number] No Take one tablet by mouth daily as needed for swelling  Patient not taking: Reported on 02/01/2023   Fenton Foy, NP Not Taking Active   hydrOXYzine (ATARAX/VISTARIL) 10 MG tablet [id number] No Take 1 tablet (10 mg total) by mouth 3 (three) times daily as needed.  Patient not taking: Reported on 02/01/2023   Azzie Glatter, FNP Not Taking Active   meclizine (ANTIVERT) 25 MG tablet [id number] No Take 1 tablet (25 mg total) by mouth 3 (three) times daily as needed for dizziness.  Patient not taking: Reported on 02/01/2023    Callas, NP Not Taking Active   metFORMIN (GLUCOPHAGE-XR) 500 MG 24 hr tablet [id number] Yes Take 1 tablet (500 mg total) by  mouth daily with breakfast. Fenton Foy, NP Taking Active   metoprolol tartrate (LOPRESSOR) 25 MG tablet [id number] Yes Take 1 and 1/2 tablets by mouth daily Fenton Foy, NP Taking Active   rosuvastatin (CRESTOR) 10 MG tablet [id number] Yes Take 1 tablet (10 mg total) by mouth daily. Fenton Foy, NP Taking Active               Assessment/Plan:   Diabetes: - Currently controlled - Reviewed long term cardiovascular and renal outcomes of uncontrolled blood sugar - Reviewed dietary  modifications including: focus on moderation with sugars, carbohydrates.  - Recommend to continue current regimen at this time.   Hypertension: - Currently controlled - Reviewed appropriate blood pressure monitoring technique and reviewed goal blood pressure. Recommended to check home blood pressure and heart rate periodically - Recommend to continue current regimen at this time  Hyperlipidemia/ASCVD Risk Reduction: - Currently controlled.  - Reviewed dietary recommendations including: focus on lean proteins, moderation with fried foods.  - Recommend to continue current regimen at this time   Follow Up Plan: follow up with PCP as scheduled  [name], PharmD, [place], [place] [phone]

## 2023-02-04 ENCOUNTER — Ambulatory Visit (INDEPENDENT_AMBULATORY_CARE_PROVIDER_SITE_OTHER): Payer: Medicare PPO | Admitting: Nurse Practitioner

## 2023-02-04 ENCOUNTER — Encounter: Payer: Self-pay | Admitting: Nurse Practitioner

## 2023-02-04 VITALS — BP 100/74 | HR 73 | Wt 137.0 lb

## 2023-02-04 DIAGNOSIS — I10 Essential (primary) hypertension: Secondary | ICD-10-CM

## 2023-02-04 DIAGNOSIS — R413 Other amnesia: Secondary | ICD-10-CM

## 2023-02-04 DIAGNOSIS — E119 Type 2 diabetes mellitus without complications: Secondary | ICD-10-CM

## 2023-02-04 MED ORDER — METOPROLOL TARTRATE 25 MG PO TABS: 37.5000 mg | ORAL_TABLET | Freq: Two times a day (BID) | ORAL | 0 refills | Status: DC

## 2023-02-04 NOTE — Patient Instructions (Signed)
1. Essential hypertension  - metoprolol tartrate (LOPRESSOR) 25 MG tablet; Take 1.5 tablets (37.5 mg total) by mouth 2 (two) times daily.  Dispense: 90 tablet; Refill: 0  2. Type 2 diabetes mellitus without complication, without long-term current use of insulin (HCC)  - Microalbumin/Creatinine Ratio, Urine - Ambulatory referral to Podiatry - CBC - Comprehensive metabolic panel  3. Memory changes  - Ambulatory referral to Neuropsychology  Follow up:  Follow up in 3 months

## 2023-02-04 NOTE — Progress Notes (Signed)
@Patient  ID: Cathy Smith, female    DOB: 07/25/1966, 57 y.o.   MRN: [id number]  Chief Complaint  Patient presents with   Depression    Follow up    Hypertension    Clarification on metoprolol     Referring provider: Fenton Foy, NP   HPI  Cathy Smith 57 y.o. female  has a past medical history of Coronary artery disease, History of condyloma acuminatum, History of uterine fibroid, History of vaginal dysplasia, History of vulvar dysplasia, HIV infection ([place]) ([facility] hat[facility]), Hypertension, Hypertriglyceridemia without hypercholesterolemia, PONV (postoperative nausea and vomiting), VIN III (vulvar intraepithelial neoplasia III), and Wears glasses. To the [facility] for reevaluation of hypertension.   Hypertension: Patient here for follow-up of elevated blood pressure. She is not exercising and is adherent to low salt diet.  Blood pressure is well controlled at home. Cardiac symptoms none. Patient denies none.  Cardiovascular risk factors: hypertension, obesity (BMI >= 30 kg/m2), and sedentary lifestyle. Use of agents associated with hypertension: none. History of target organ damage: none. States that she regularly takes B/P at home and it is similar to today's values. Currently compliant with all medications.   Diabetes: Presents for follow up on diabetes. Currently on metformin for diabetes - working on diabetic diet. Patient has been to nutritionist, but states that she is still eating unhealthy. Did discuss healthy food choices today. A1c in office today was good. Denies f/c/s, n/v/d, hemoptysis, PND, leg swelling Denies chest pain or edema     Denies f/c/s, n/v/d, hemoptysis, PND, leg swelling Denies chest pain or edema  Patient's husband discussed concerns about patient's memory.  He is concerned that she may be getting early dementia.  We will refer patient to neuropsychology for evaluation.  We will do Mini-Mental status in office today. mmSE  in office today was 19.    No Known Allergies  Immunization History  Administered Date(s) Administered   COVID-19, mRNA, vaccine(Comirnaty)12 years and older 08/26/2022   H1N1 10/18/2008   Hepatitis B 01/13/2001, 02/02/2001, 03/02/2001, 11/16/2001   Hepatitis B, ADULT 02/05/2014, 08/08/2014, 02/11/2015   Influenza Split 09/02/2011, 12/20/2012   Influenza Whole 08/30/2006, 08/03/2007, 09/24/2008, 08/07/2009, 09/11/2010   Influenza,inj,Quad PF,6+ Mos 08/02/2013, 08/08/2014, 08/27/2015, 08/19/2016, 09/20/2017, 08/15/2018, 08/16/2019, 08/12/2020, 08/04/2021, 08/26/2022   Meningococcal Mcv4o 04/21/2017   PFIZER(Purple Top)SARS-COV-2 Vaccination 01/25/2020, 02/19/2020, 08/30/2020   PNEUMOCOCCAL CONJUGATE-20 04/07/2022   Pfizer Covid-19 Vaccine Bivalent Booster 42yrs & up 08/04/2021   Pneumococcal Conjugate-13 01/13/2016   Pneumococcal Polysaccharide-23 08/30/2006, 12/31/2010   Tdap 09/06/2014    Past Medical History:  Diagnosis Date   Coronary artery disease    History of condyloma acuminatum    History of uterine fibroid    History of vaginal dysplasia    VAIN III--  oncologist-  dr Denman George   History of vulvar dysplasia    VIN III   gyn-oncologist-  dr Denman George   HIV infection [facility]) [facility] hatcher   Hypertension    Hypertriglyceridemia without hypercholesterolemia    PONV (postoperative nausea and vomiting)    Type 2 diabetes mellitus without complication, without long-term current use of insulin ([place]) 09/02/2022   VIN III (vulvar intraepithelial neoplasia III)    Wears glasses     Tobacco History: Social History   Tobacco Use  Smoking Status Never  Smokeless Tobacco Never   Counseling given: Not Answered   Outpatient Encounter Medications as of 02/04/2023  Medication Sig  darunavir (PREZISTA) 800 MG tablet TAKE 1 TABLET(800 MG) BY MOUTH DAILY WITH BREAKFAST   elvitegravir-cobicistat-emtricitabine-tenofovir (GENVOYA) 150-150-200-10 MG  TABS tablet Take 1 tablet by mouth daily with breakfast.   metFORMIN (GLUCOPHAGE-XR) 500 MG 24 hr tablet Take 1 tablet (500 mg total) by mouth daily with breakfast.   rosuvastatin (CRESTOR) 10 MG tablet Take 1 tablet (10 mg total) by mouth daily.   [DISCONTINUED] metoprolol tartrate (LOPRESSOR) 25 MG tablet Take 1.5 tablets (37.5 mg total) by mouth 2 (two) times daily.   furosemide (LASIX) 40 MG tablet Take one tablet by mouth daily as needed for swelling (Patient not taking: Reported on 02/01/2023)   hydrOXYzine (ATARAX/VISTARIL) 10 MG tablet Take 1 tablet (10 mg total) by mouth 3 (three) times daily as needed. (Patient not taking: Reported on 02/01/2023)   meclizine (ANTIVERT) 25 MG tablet Take 1 tablet (25 mg total) by mouth 3 (three) times daily as needed for dizziness. (Patient not taking: Reported on 02/01/2023)   metoprolol tartrate (LOPRESSOR) 25 MG tablet Take 1.5 tablets (37.5 mg total) by mouth 2 (two) times daily.   [DISCONTINUED] atorvastatin (LIPITOR) 20 MG tablet TAKE 1 TABLET(20 MG) BY MOUTH DAILY AT 6 PM   No facility-administered encounter medications on file as of 02/04/2023.     Review of Systems  Review of Systems  Constitutional: Negative.   HENT: Negative.    Cardiovascular: Negative.   Gastrointestinal: Negative.   Allergic/Immunologic: Negative.   Neurological: Negative.   Psychiatric/Behavioral: Negative.         Physical Exam  BP 100/74   Pulse 73   Wt 137 lb (62.1 kg)   SpO2 99%   BMI 25.89 kg/m   Wt Readings from Last 5 Encounters:  02/04/23 137 lb (62.1 kg)  01/12/23 137 lb (62.1 kg)  01/07/23 139 lb (63 kg)  12/25/22 139 lb (63 kg)  12/16/22 135 lb 9.6 oz (61.5 kg)     Physical Exam Vitals and nursing note reviewed.  Constitutional:      General: She is not in acute distress.    Appearance: She is well-developed.  Cardiovascular:     Rate and Rhythm: Normal rate and regular rhythm.  Pulmonary:     Effort: Pulmonary effort is normal.      Breath sounds: Normal breath sounds.  Neurological:     Mental Status: She is alert and oriented to person, place, and time.      Lab Results:  CBC    Component Value Date/Time   WBC 3.2 (L) 12/16/2022 1056   WBC 3.7 (L) 08/04/2021 1013   RBC 3.92 12/16/2022 1056   RBC 3.88 08/04/2021 1013   HGB 12.8 12/16/2022 1056   HCT 37.4 12/16/2022 1056   PLT 238 12/16/2022 1056   MCV 95 12/16/2022 1056   MCH 32.7 12/16/2022 1056   MCH 32.5 08/04/2021 1013   MCHC 34.2 12/16/2022 1056   MCHC 33.3 08/04/2021 1013   RDW 11.2 (L) 12/16/2022 1056   LYMPHSABS 1.7 09/02/2022 1129   MONOABS 408 04/13/2017 1122   EOSABS 0.1 09/02/2022 1129   BASOSABS 0.0 09/02/2022 1129    BMET    Component Value Date/Time   NA 143 12/16/2022 1056   K 3.4 (L) 12/16/2022 1056   CL 99 12/16/2022 1056   CO2 28 12/16/2022 1056   GLUCOSE 123 (H) 12/16/2022 1056   GLUCOSE 130 (H) 08/04/2021 1013   BUN 8 12/16/2022 1056   CREATININE 0.97 12/16/2022 1056   CREATININE 1.10 (H) 08/04/2021  1013   CALCIUM 8.9 12/16/2022 1056   GFRNONAA 65 11/15/2020 1403   GFRNONAA 68 08/01/2019 0908   GFRAA 76 11/15/2020 1403   GFRAA 79 08/01/2019 0908    BNP No results found for: "BNP"  ProBNP No results found for: "PROBNP"  Imaging: No results found.   Assessment & Plan:   Essential hypertension - metoprolol tartrate (LOPRESSOR) 25 MG tablet; Take 1.5 tablets (37.5 mg total) by mouth 2 (two) times daily.  Dispense: 90 tablet; Refill: 0  2. Type 2 diabetes mellitus without complication, without long-term current use of insulin (HCC)  - Microalbumin/Creatinine Ratio, Urine - Ambulatory referral to Podiatry - CBC - Comprehensive metabolic panel  3. Memory changes  - Ambulatory referral to Neuropsychology  Follow up:  Follow up in 3 months     Fenton Foy, NP 02/04/2023

## 2023-02-04 NOTE — Assessment & Plan Note (Signed)
-   metoprolol tartrate (LOPRESSOR) 25 MG tablet; Take 1.5 tablets (37.5 mg total) by mouth 2 (two) times daily.  Dispense: 90 tablet; Refill: 0  2. Type 2 diabetes mellitus without complication, without long-term current use of insulin (HCC)  - Microalbumin/Creatinine Ratio, Urine - Ambulatory referral to Podiatry - CBC - Comprehensive metabolic panel  3. Memory changes  - Ambulatory referral to Neuropsychology  Follow up:  Follow up in 3 months

## 2023-02-05 DIAGNOSIS — E119 Type 2 diabetes mellitus without complications: Secondary | ICD-10-CM | POA: Diagnosis not present

## 2023-02-06 LAB — CBC
Hematocrit: 35.1 % (ref 34.0–46.6)
Hemoglobin: 12 g/dL (ref 11.1–15.9)
MCH: 32.8 pg (ref 26.6–33.0)
MCHC: 34.2 g/dL (ref 31.5–35.7)
MCV: 96 fL (ref 79–97)
Platelets: 167 10*3/uL (ref 150–450)
RBC: 3.66 x10E6/uL — ABNORMAL LOW (ref 3.77–5.28)
RDW: 11.4 % — ABNORMAL LOW (ref 11.7–15.4)
WBC: 3.4 10*3/uL (ref 3.4–10.8)

## 2023-02-06 LAB — COMPREHENSIVE METABOLIC PANEL
ALT: 10 IU/L (ref 0–32)
AST: 16 IU/L (ref 0–40)
Albumin/Globulin Ratio: 2.3 — ABNORMAL HIGH (ref 1.2–2.2)
Albumin: 4.5 g/dL (ref 3.8–4.9)
Alkaline Phosphatase: 104 IU/L (ref 44–121)
BUN/Creatinine Ratio: 18 (ref 9–23)
BUN: 12 mg/dL (ref 6–24)
Bilirubin Total: <0.2 mg/dL (ref 0.0–1.2)
CO2: 24 mmol/L (ref 20–29)
Calcium: 9.2 mg/dL (ref 8.7–10.2)
Chloride: 101 mmol/L (ref 96–106)
Creatinine, Ser: 0.66 mg/dL (ref 0.57–1.00)
Globulin, Total: 2 g/dL (ref 1.5–4.5)
Glucose: 72 mg/dL (ref 70–99)
Potassium: 3.4 mmol/L — ABNORMAL LOW (ref 3.5–5.2)
Sodium: 141 mmol/L (ref 134–144)
Total Protein: 6.5 g/dL (ref 6.0–8.5)
eGFR: 101 mL/min/{1.73_m2} (ref >59)

## 2023-02-06 LAB — MICROALBUMIN / CREATININE URINE RATIO
Creatinine, Urine: 46.1 mg/dL
Microalb/Creat Ratio: 12 mg/g creat (ref 0–29)
Microalbumin, Urine: 5.4 ug/mL

## 2023-02-08 ENCOUNTER — Ambulatory Visit: Payer: Self-pay | Admitting: Clinical

## 2023-02-15 ENCOUNTER — Ambulatory Visit: Payer: Self-pay | Admitting: Clinical

## 2023-02-15 NOTE — Telephone Encounter (Signed)
Done at pt visit. Kh

## 2023-02-16 ENCOUNTER — Telehealth: Payer: Self-pay | Admitting: Clinical

## 2023-02-16 NOTE — Telephone Encounter (Signed)
Cathy Smith, Patient LVM at 8:18 am this morning requesting a return call from you regarding her missed appointment. [phone]

## 2023-02-18 ENCOUNTER — Encounter: Payer: Self-pay | Admitting: Physician Assistant

## 2023-02-22 ENCOUNTER — Ambulatory Visit (INDEPENDENT_AMBULATORY_CARE_PROVIDER_SITE_OTHER): Payer: Self-pay | Admitting: Clinical

## 2023-02-22 DIAGNOSIS — R454 Irritability and anger: Secondary | ICD-10-CM

## 2023-02-22 DIAGNOSIS — F32A Depression, unspecified: Secondary | ICD-10-CM

## 2023-02-22 NOTE — BH Specialist Note (Signed)
  Integrated Behavioral Health Follow Up In-Person Visit  MRN: [id number] Name: Cathy Smith  Number of Integrated Behavioral Health Clinician visits: 3- Third Visit  Session Start time: 1015   Session End time: 1100  Total time in minutes: 45   Types of Service: Individual psychotherapy  Interpretor:No. Interpretor Name and Language: none  Subjective: [name] is a 57 y.o. female accompanied by  spouse. Patient was referred by Angus Seller, NP for easily upset and irritated. Patient reports the following symptoms/concerns: easily upset and irritated Duration of problem: several months; Severity of problem: severe  Objective: Mood: Irritable and Affect: Appropriate Risk of harm to self or others: No plan to harm self or others  Patient and/or Family's Strengths/Protective Factors: Social connections and Concrete supports in place (healthy food, safe environments, etc.)   Goals Addressed: Patient will: Reduce symptoms of: depression and mood instability Increase knowledge and/or ability of: coping skills and self-management skills  Demonstrate ability to: Increase healthy adjustment to current life circumstances and Begin healthy grieving over loss  Progress towards Goals: Ongoing  Interventions: Interventions utilized:  Solution-Focused Strategies and Supportive Counseling Standardized Assessments completed: Not Needed  Patient was accompanied by spouse today. She expressed frustration with having been referred to neurology for issues with her memory. Discussed the reason for the referral and patient agreeable to go to the appointment; it is scheduled for 03/15/23. Supportive counseling today around patient's changes in mood and feeling down. She reports feeling down because she is often at home with nothing to do. Revisited idea of volunteering out of the home, maybe at a school or animal shelter. Also discussed increasing social activities; brainstormed ways to do  this. Patient has relied heavily on her spouse for social interaction.   Patient and/or Family Response: Patient engaged in session.   Assessment: Patient currently experiencing irritability, easy upset, and memory changes.   Patient may benefit from supportive counseling and coping skill development. She may also benefit from further evaluation by another specialist regarding her memory concerns.  Plan: Follow up with behavioral health clinician on: 03/08/23 Behavioral recommendations: increase social activities Referral(s): Counselor  Abigail Butts, LCSW

## 2023-02-23 ENCOUNTER — Ambulatory Visit (INDEPENDENT_AMBULATORY_CARE_PROVIDER_SITE_OTHER): Payer: Medicare PPO | Admitting: Podiatry

## 2023-02-23 ENCOUNTER — Encounter: Payer: Self-pay | Admitting: Podiatry

## 2023-02-23 DIAGNOSIS — M79675 Pain in left toe(s): Secondary | ICD-10-CM | POA: Diagnosis not present

## 2023-02-23 DIAGNOSIS — E119 Type 2 diabetes mellitus without complications: Secondary | ICD-10-CM | POA: Diagnosis not present

## 2023-02-23 DIAGNOSIS — M79674 Pain in right toe(s): Secondary | ICD-10-CM

## 2023-02-23 DIAGNOSIS — B351 Tinea unguium: Secondary | ICD-10-CM | POA: Diagnosis not present

## 2023-02-23 NOTE — Progress Notes (Signed)
  Subjective:  Patient ID: Cathy Smith, female    DOB: 04-16-1966,   MRN: [id number]  Chief Complaint  Patient presents with   Nail Problem    Diabetic foot care    57 y.o. female presents for concern of thickened elongated and painful nails that are difficult to trim. Requesting to have them trimmed today. Relates burning and tingling in their feet. Patient is diabetic and last A1c was  Lab Results  Component Value Date   HGBA1C 5.8 (A) 12/16/2022   .   PCP:  Ivonne Andrew, NP    . Denies any other pedal complaints. Denies n/v/f/c.   Past Medical History:  Diagnosis Date   Coronary artery disease    History of condyloma acuminatum    History of uterine fibroid    History of vaginal dysplasia    VAIN III--  oncologist-  dr Andrey Farmer   History of vulvar dysplasia    VIN III   gyn-oncologist-  dr Andrey Farmer   [facility] hat[facility]   Hypertension    Hypertriglyceridemia without hypercholesterolemia    PONV (postoperative nausea and vomiting)    Type 2 diabetes mellitus without complication, without long-term current use of insulin 09/02/2022   VIN III (vulvar intraepithelial neoplasia III)    Wears glasses     Objective:  Physical Exam: Vascular: DP/PT pulses 2/4 bilateral. CFT <3 seconds. Absent hair growth on digits. Edema noted to bilateral lower extremities. Xerosis noted bilaterally.  Skin. No lacerations or abrasions bilateral feet. Nails 1-5 bilateral  are thickened discolored and elongated with subungual debris.  Musculoskeletal: MMT 5/5 bilateral lower extremities in DF, PF, Inversion and Eversion. Deceased ROM in DF of ankle joint.  Neurological: Sensation intact to light touch. Protective sensation diminished bilateral.    Assessment:   1. Pain due to onychomycosis of toenails of both feet   2. Type 2 diabetes mellitus without complication, without long-term current use of insulin      Plan:  Patient was evaluated and  treated and all questions answered. -Discussed and educated patient on diabetic foot care, especially with  regards to the vascular, neurological and musculoskeletal systems.  -Stressed the importance of good glycemic control and the detriment of not  controlling glucose levels in relation to the foot. -Discussed supportive shoes at all times and checking feet regularly.  -Mechanically debrided all nails 1-5 bilateral using sterile nail nipper and filed with dremel without incident  -Answered all patient questions -Patient to return  in 3 months for at risk foot care -Patient advised to call the office if any problems or questions arise in the meantime.   Louann Sjogren, DPM

## 2023-03-08 ENCOUNTER — Ambulatory Visit (INDEPENDENT_AMBULATORY_CARE_PROVIDER_SITE_OTHER): Payer: Self-pay | Admitting: Clinical

## 2023-03-08 DIAGNOSIS — F32A Depression, unspecified: Secondary | ICD-10-CM

## 2023-03-08 DIAGNOSIS — R454 Irritability and anger: Secondary | ICD-10-CM

## 2023-03-08 NOTE — BH Specialist Note (Unsigned)
Other volunteer opportunities and patietn interested in applying as a Orthoptist at [employer]

## 2023-03-15 ENCOUNTER — Other Ambulatory Visit (INDEPENDENT_AMBULATORY_CARE_PROVIDER_SITE_OTHER): Payer: Medicare PPO

## 2023-03-15 ENCOUNTER — Ambulatory Visit: Payer: Medicare PPO | Admitting: Physician Assistant

## 2023-03-15 ENCOUNTER — Encounter: Payer: Self-pay | Admitting: Physician Assistant

## 2023-03-15 VITALS — BP 112/80 | HR 74 | Resp 20 | Ht 61.0 in | Wt 130.0 lb

## 2023-03-15 DIAGNOSIS — R413 Other amnesia: Secondary | ICD-10-CM

## 2023-03-15 LAB — VITAMIN B12: Vitamin B-12: 396 pg/mL (ref 211–911)

## 2023-03-15 LAB — TSH: TSH: 1.06 u[IU]/mL (ref 0.35–5.50)

## 2023-03-15 NOTE — Progress Notes (Signed)
B12 is on the lower normal, vitamin B12 is low.  Recommend starting on vitamin B12 1000 mcg daily.  Follow-up with PCP.  Thyroid levels are normal Thank you

## 2023-03-15 NOTE — Progress Notes (Signed)
Assessment/Plan:   Cathy Smith is a very pleasant 57 y.o. year old RH female with a history of hypertension, hyperlipidemia, DM2 with neuropathy, PVD with history of claudication,  vulvar neoplasia  ( cancer center), CAD s/p CABG, anxiety, depression  seen today for evaluation of memory loss. MoCA today is 22/30. Etiology unclear. Able to perform ADLs.     Memory Difficulties  MRI brain without contrast to assess for underlying structural abnormality and assess vascular load  Check B12, TSH Continue to control mood as per PCP and Behavioral Therapy Recommend good control of cardiovascular risk factors Increase the level of physical and social activity for cognitive stimulation   Folllow up in 1 month   Subjective:   The patient is accompanied by her husband who supplements the history.   How long did patient have memory difficulties? "It was the PCP's staff".  Husband reports that the patient has some difficulty remembering recent conversations and people names but"not bad". Enjoys word finding names.  repeats oneself?  Endorsed. I have a tendency to repeat things but I don't do it all the time Disoriented when walking into a room?  Patient denies  Leaving objects in unusual places? denies   Wandering behavior?  denies   Any personality changes ? Endorsed, easily irritable and upset followed by [facility].  Hallucinations or paranoia?  Patient denies   Seizures?   Patient denies    Any sleep changes?  Denies. Reports occasionally having vivid dreams, REM behavior or sleepwalking   Sleep apnea?  Patient denies   Any hygiene concerns?  Patient denies   Independent of bathing and dressing?  Endorsed  Does the patient needs help with medications? Husband  is in charge   Who is in charge of the finances?  Husband is in charge     Any changes in appetite?  denies     Patient have trouble swallowing? denies   Does the patient cook? Simple cooking  Any headaches?   denies   Chronic back  pain ? denies   Ambulates with difficulty?  denies   Recent falls or head injuries? denies   Vision changes? denies   Unilateral weakness, numbness or tingling? denies   Any tremors?   denies   Any anosmia?  denies   Any incontinence of urine? denies   Any bowel dysfunction? denies      Patient lives  with husband History of heavy alcohol intake? denies   History of heavy tobacco use? denies   Family history of dementia? 1 paternal aunt with dementia of unknown type Does patient drive? Never drove.    Past Medical History:  Diagnosis Date   Coronary artery disease    History of condyloma acuminatum    History of uterine fibroid    History of vaginal dysplasia    VAIN III--  oncologist-  dr Andrey Farmer   History of vulvar dysplasia    VIN III   gyn-oncologist-  dr Andrey Farmer   HIV infection [facility]) [facility]-  dr hatcher   Hypertension    Hypertriglyceridemia without hypercholesterolemia    PONV (postoperative nausea and vomiting)    Type 2 diabetes mellitus without complication, without long-term current use of insulin (HCC) 09/02/2022   VIN III (vulvar intraepithelial neoplasia III)    Wears glasses      Past Surgical History:  Procedure Laterality Date   ABDOMINAL HYSTERECTOMY  1995 approx   ANTERIOR CERVICAL DECOMP/DISCECTOMY FUSION  11-30-2009  C4 -- C6   CO2 LASER APPLICATION Bilateral 07/23/2015   Procedure: BILATERAL CO2 LASER ABLATION OF THE VULVA;  Surgeon: Adolphus Birchwood, MD;  Location: [facility];  Service: Gynecology;  Laterality: Bilateral;   CO2 LASER APPLICATION N/A 02/25/2016   Procedure: CO2 LASER OF THE VULVAR;  Surgeon: Adolphus Birchwood, MD;  Location: [facility];  Service: Gynecology;  Laterality: N/A;   CO2 LASER APPLICATION N/A 05/04/2017   Procedure: CO2 LASER VAPORIZATION OF THE VULVA;  Surgeon: Adolphus Birchwood, MD;  Location: [facility];  Service: Gynecology;  Laterality: N/A;   CO2 LASER APPLICATION  N/A 12/27/2018   Procedure: CO2 LASER APPLICATION OF VULVA;  Surgeon: Adolphus Birchwood, MD;  Location: [facility];  Service: Gynecology;  Laterality: N/A;   COLONOSCOPY  01-28-2015   CORONARY ARTERY BYPASS GRAFT N/A 11/25/2017   Procedure: CORONARY ARTERY BYPASS GRAFTING (CABG), ON PUMP, TIMES THREE, USING LEFT INTERNAL MAMMARY ARTERY AND ENDOSCOPICALLY HARVESTED LEFT GREATER SAPHENOUS VEIN;  Surgeon: Purcell Nails, MD;  Location: [facility] OR;  Service: Open Heart Surgery;  Laterality: N/A;   I & D LEFT BUTTOCK ABSCESS  04-28-2001   LEFT HEART CATH AND CORONARY ANGIOGRAPHY N/A 11/22/2017   Procedure: LEFT HEART CATH AND CORONARY ANGIOGRAPHY;  Surgeon: Runell Gess, MD;  Location: [facility];  Service: Cardiovascular;  Laterality: N/A;   TEE WITHOUT CARDIOVERSION N/A 11/25/2017   Procedure: TRANSESOPHAGEAL ECHOCARDIOGRAM (TEE);  Surgeon: Purcell Nails, MD;  Location[facility] OR;  Service: Open Heart Surgery;  Laterality: N/A;   VULVECTOMY Right 05/06/2015   Procedure: WIDE LOCAL  EXCISION  OF RIGHT VULVA;  Surgeon: Adolphus Birchwood, MD;  Location: [facility];  Service: Gynecology;  Laterality: Right;   VULVECTOMY N/A 05/04/2017   Procedure: WIDE EXCISION VULVECTOMY;  Surgeon: Adolphus Birchwood, MD;  Location: [facility];  Service: Gynecology;  Laterality: N/A;   VULVECTOMY N/A 12/27/2018   Procedure: WIDE EXCISION VULVECTOMY;  Surgeon: Adolphus Birchwood, MD;  Location: [facility];  Service: Gynecology;  Laterality: N/A;     No Known Allergies  Current Outpatient Medications  Medication Instructions   darunavir (PREZISTA) 800 MG tablet TAKE 1 TABLET(800 MG) BY MOUTH DAILY WITH BREAKFAST   elvitegravir-cobicistat-emtricitabine-tenofovir (GENVOYA) 150-150-200-10 MG TABS tablet 1 tablet, Oral, Daily with breakfast   furosemide (LASIX) 40 MG tablet Take one tablet by mouth daily as needed for swelling   hydrOXYzine (ATARAX) 10 mg, Oral, 3 times daily PRN    meclizine (ANTIVERT) 25 mg, Oral, 3 times daily PRN   metFORMIN (GLUCOPHAGE-XR) 500 mg, Oral, Daily with breakfast   metoprolol tartrate (LOPRESSOR) 37.5 mg, Oral, 2 times daily   rosuvastatin (CRESTOR) 10 mg, Oral, Daily     VITALS:   Vitals:   03/15/23 1005  BP: 112/80  Pulse: 74  Resp: 20  SpO2: 98%  Weight: 130 lb (59 kg)  Height: 5\' 1"  (1.549 m)      02/04/2023    3:58 PM 01/21/2023   11:45 AM 01/07/2023    9:36 AM 12/16/2022   10:19 AM 12/16/2022   10:18 AM  Depression screen PHQ 2/9  Decreased Interest 0 2 0 1 0  Down, Depressed, Hopeless 0 2 0 1 0  PHQ - 2 Score 0 4 0 2 0  Altered sleeping 0 0 0 0   Tired, decreased energy 1 1 0 2   Change in appetite 0 0     Feeling bad or  failure about yourself  0 1 0 0   Trouble concentrating 0 1 0 0   Moving slowly or fidgety/restless 0 0 0 2   Suicidal thoughts 0 0 0 0   PHQ-9 Score 1 7 0 6   Difficult doing work/chores Not difficult at all  Not difficult at all Somewhat difficult     PHYSICAL EXAM   HEENT:  Normocephalic, atraumatic. The mucous membranes are moist. The superficial temporal arteries are without ropiness or tenderness. Cardiovascular: Regular rate and rhythm. Lungs: Clear to auscultation bilaterally. Neck: There are no carotid bruits noted bilaterally.  NEUROLOGICAL:    03/15/2023   11:00 AM  Montreal Cognitive Assessment   Visuospatial/ Executive (0/5) 2  Naming (0/3) 3  Attention: Read list of digits (0/2) 1  Attention: Read list of letters (0/1) 1  Attention: Serial 7 subtraction starting at 100 (0/3) 0  Language: Repeat phrase (0/2) 1  Language : Fluency (0/1) 1  Abstraction (0/2) 1  Delayed Recall (0/5) 5  Orientation (0/6) 6  Total 21  Adjusted Score (based on education) 22        No data to display           Orientation:  Alert and oriented to person, place and time. Anxious appearing. No aphasia or dysarthria. Fund of knowledge is appropriate. Recent memory impaired and remote  memory intact.  Attention and concentration are reduced.  Able to name objects and repeat phrases. Delayed recall 5/5 Cranial nerves: There is good facial symmetry. Extraocular muscles are intact and visual fields are full to confrontational testing. Speech is fluent and clear. No tongue deviation. Hearing is intact to conversational tone. Tone: Tone is good throughout. Sensation: Sensation is intact to light touch and pinprick throughout. Vibration is intact at the bilateral big toe.There is no extinction with double simultaneous stimulation. There is no sensory dermatomal level identified. Coordination: The patient has no difficulty with RAM's or FNF bilaterally. Normal finger to nose  Motor: Strength is 5/5 in the bilateral upper and lower extremities. There is no pronator drift. There are no fasciculations noted. DTR's: Deep tendon reflexes are 2/4 at the bilateral biceps, triceps, brachioradialis, patella and achilles.  Plantar responses are downgoing bilaterally. Gait and Station: The patient is able to ambulate without difficulty.The patient is able to heel toe walk without any difficulty.The patient is able to ambulate in a tandem fashion. The patient is able to stand in the Romberg position.     Thank you for allowing [place] the opportunity to participate in the care of this nice patient. Please do not hesitate to contact us for any questions or concerns.   Total time spent on today's visit was 54 minutes dedicated to this patient today, preparing to see patient, examining the patient, ordering tests and/or medications and counseling the patient, documenting clinical information in the EHR or other health record, independently interpreting results and communicating results to the patient/family, discussing treatment and goals, answering patient's questions and coordinating care.  Cc:  Ivonne Andrew, NP  [name] 03/15/2023 11:01 AM

## 2023-03-15 NOTE — Patient Instructions (Signed)
It was a pleasure to see you today at our office.   Recommendations:   MRI of the brain, the radiology office will call you to arrange you appointment   Check labs today   Follow up in 1 month    For assessment of decision of mental capacity and competency:  Call Dr. Erick Blinks, geriatric psychiatrist at [phone] Counseling regarding caregiver distress, including caregiver depression, anxiety and issues regarding community resources, adult day care programs, adult living facilities, or memory care questions:  please contact your  Primary Doctor's Social Worker  Whom to call: Memory  decline, memory medications: Call our office [phone]  For psychiatric meds, mood meds: Please have your primary care physician manage these medications.  If you have any severe symptoms of a stroke, or other severe issues such as confusion,severe chills or fever, etc call 911 or go to the ER as you may need to be evaluated further    RECOMMENDATIONS FOR ALL PATIENTS WITH MEMORY PROBLEMS: 1. Continue to exercise (Recommend 30 minutes of walking everyday, or 3 hours every week) 2. Increase social interactions - continue going to [place] and enjoy social gatherings with friends and family 3. Eat healthy, avoid fried foods and eat more fruits and vegetables 4. Maintain adequate blood pressure, blood sugar, and blood cholesterol level. Reducing the risk of stroke and cardiovascular disease also helps promoting better memory. 5. Avoid stressful situations. Live a simple life and avoid aggravations. Organize your time and prepare for the next day in anticipation. 6. Sleep well, avoid any interruptions of sleep and avoid any distractions in the bedroom that may interfere with adequate sleep quality 7. Avoid sugar, avoid sweets as there is a strong link between excessive sugar intake, diabetes, and cognitive impairment We discussed the Mediterranean diet, which has been shown to help patients reduce the risk  of progressive memory disorders and reduces cardiovascular risk. This includes eating fish, eat fruits and green leafy vegetables, nuts like almonds and hazelnuts, walnuts, and also use olive oil. Avoid fast foods and fried foods as much as possible. Avoid sweets and sugar as sugar use has been linked to worsening of memory function.  There is always a concern of gradual progression of memory problems. If this is the case, then we may need to adjust level of care according to patient needs. Support, both to the patient and caregiver, should then be put into place.      You have been referred for a neuropsychological evaluation (i.e., evaluation of memory and thinking abilities). Please bring someone with you to this appointment if possible, as it is helpful for the doctor to hear from both you and another adult who knows you well. Please bring eyeglasses and hearing aids if you wear them.    The evaluation will take approximately 3 hours and has two parts:   The first part is a clinical interview with the neuropsychologist (Dr. Milbert Coulter or Dr. Roseanne Reno). During the interview, the neuropsychologist will speak with you and the individual you brought to the appointment.    The second part of the evaluation is testing with the doctor's technician Annabelle Harman or Selena Batten). During the testing, the technician will ask you to remember different types of material, solve problems, and answer some questionnaires. Your family member will not be present for this portion of the evaluation.   Please note: We must reserve several hours of the neuropsychologist's time and the psychometrician's time for your evaluation appointment. As such, there is a No-Show  fee of $100. If you are unable to attend any of your appointments, please contact our office as soon as possible to reschedule.    FALL PRECAUTIONS: Be cautious when walking. Scan the area for obstacles that may increase the risk of trips and falls. When getting up in the  mornings, sit up at the edge of the bed for a few minutes before getting out of bed. Consider elevating the bed at the head end to avoid drop of blood pressure when getting up. Walk always in a well-lit room (use night lights in the walls). Avoid area rugs or power cords from appliances in the middle of the walkways. Use a walker or a cane if necessary and consider physical therapy for balance exercise. Get your eyesight checked regularly.  FINANCIAL OVERSIGHT: Supervision, especially oversight when making financial decisions or transactions is also recommended.  HOME SAFETY: Consider the safety of the kitchen when operating appliances like stoves, microwave oven, and blender. Consider having supervision and share cooking responsibilities until no longer able to participate in those. Accidents with firearms and other hazards in the house should be identified and addressed as well.   ABILITY TO BE LEFT ALONE: If patient is unable to contact 911 operator, consider using LifeLine, or when the need is there, arrange for someone to stay with patients. Smoking is a fire hazard, consider supervision or cessation. Risk of wandering should be assessed by caregiver and if detected at any point, supervision and safe proof recommendations should be instituted.  MEDICATION SUPERVISION: Inability to self-administer medication needs to be constantly addressed. Implement a mechanism to ensure safe administration of the medications.   DRIVING: Regarding driving, in patients with progressive memory problems, driving will be impaired. We advise to have someone else do the driving if trouble finding directions or if minor accidents are reported. Independent driving assessment is available to determine safety of driving.   If you are interested in the driving assessment, you can contact the following:  [employer] in [place] [phone]  [facility] [phone]  [facility] [phone] or [phone]    Mediterranean Diet A Mediterranean diet refers to food and lifestyle choices that are based on the traditions of countries located on the [employer]. This way of eating has been shown to help prevent certain conditions and improve outcomes for people who have chronic diseases, like kidney disease and heart disease. What are tips for following this plan? Lifestyle  Cook and eat meals together with your family, when possible. Drink enough fluid to keep your urine clear or pale yellow. Be physically active every day. This includes: Aerobic exercise like running or swimming. Leisure activities like gardening, walking, or housework. Get 7-8 hours of sleep each night. If recommended by your health care provider, drink red wine in moderation. This means 1 glass a day for nonpregnant women and 2 glasses a day for men. A glass of wine equals 5 oz (150 mL). Reading food labels  Check the serving size of packaged foods. For foods such as rice and pasta, the serving size refers to the amount of cooked product, not dry. Check the total fat in packaged foods. Avoid foods that have saturated fat or trans fats. Check the ingredients list for added sugars, such as corn syrup. Shopping  At the grocery store, buy most of your food from the areas near the walls of the store. This includes: Fresh fruits and vegetables (produce). Grains, beans, nuts,  and seeds. Some of these may be available in unpackaged forms or large amounts (in bulk). Fresh seafood. Poultry and eggs. Low-fat dairy products. Buy whole ingredients instead of prepackaged foods. Buy fresh fruits and vegetables in-season from local farmers markets. Buy frozen fruits and vegetables in resealable bags. If you do not have access to quality fresh seafood, buy precooked frozen shrimp or canned fish, such as tuna, salmon, or sardines. Buy small amounts of raw or cooked  vegetables, salads, or olives from the deli or salad bar at your store. Stock your pantry so you always have certain foods on hand, such as olive oil, canned tuna, canned tomatoes, rice, pasta, and beans. Cooking  Cook foods with extra-virgin olive oil instead of using butter or other vegetable oils. Have meat as a side dish, and have vegetables or grains as your main dish. This means having meat in small portions or adding small amounts of meat to foods like pasta or stew. Use beans or vegetables instead of meat in common dishes like chili or lasagna. Experiment with different cooking methods. Try roasting or broiling vegetables instead of steaming or sauteing them. Add frozen vegetables to soups, stews, pasta, or rice. Add nuts or seeds for added healthy fat at each meal. You can add these to yogurt, salads, or vegetable dishes. Marinate fish or vegetables using olive oil, lemon juice, garlic, and fresh herbs. Meal planning  Plan to eat 1 vegetarian meal one day each week. Try to work up to 2 vegetarian meals, if possible. Eat seafood 2 or more times a week. Have healthy snacks readily available, such as: Vegetable sticks with hummus. Greek yogurt. Fruit and nut trail mix. Eat balanced meals throughout the week. This includes: Fruit: 2-3 servings a day Vegetables: 4-5 servings a day Low-fat dairy: 2 servings a day Fish, poultry, or lean meat: 1 serving a day Beans and legumes: 2 or more servings a week Nuts and seeds: 1-2 servings a day Whole grains: 6-8 servings a day Extra-virgin olive oil: 3-4 servings a day Limit red meat and sweets to only a few servings a month What are my food choices? Mediterranean diet Recommended Grains: Whole-grain pasta. Brown rice. Bulgar wheat. Polenta. Couscous. Whole-wheat bread. Orpah Cobb. Vegetables: Artichokes. Beets. Broccoli. Cabbage. Carrots. Eggplant. Green beans. Chard. Kale. Spinach. Onions. Leeks. Peas. Squash. Tomatoes. Peppers.  Radishes. Fruits: Apples. Apricots. Avocado. Berries. Bananas. Cherries. Dates. Figs. Grapes. Lemons. Melon. Oranges. Peaches. Plums. Pomegranate. Meats and other protein foods: Beans. Almonds. Sunflower seeds. Pine nuts. Peanuts. Cod. Salmon. Scallops. Shrimp. Tuna. Tilapia. Clams. Oysters. Eggs. Dairy: Low-fat milk. Cheese. Greek yogurt. Beverages: Water. Red wine. Herbal tea. Fats and oils: Extra virgin olive oil. Avocado oil. Grape seed oil. Sweets and desserts: [place] yogurt with honey. Baked apples. Poached pears. Trail mix. Seasoning and other foods: Basil. Cilantro. Coriander. Cumin. Mint. Parsley. Sage. Rosemary. Tarragon. Garlic. Oregano. Thyme. Pepper. Balsalmic vinegar. Tahini. Hummus. Tomato sauce. Olives. Mushrooms. Limit these Grains: Prepackaged pasta or rice dishes. Prepackaged cereal with added sugar. Vegetables: Deep fried potatoes (french fries). Fruits: Fruit canned in syrup. Meats and other protein foods: Beef. Pork. Lamb. Poultry with skin. Hot dogs. Tomasa Blase. Dairy: Ice cream. Sour cream. Whole milk. Beverages: Juice. Sugar-sweetened soft drinks. Beer. Liquor and spirits. Fats and oils: Butter. Canola oil. Vegetable oil. Beef fat (tallow). Lard. Sweets and desserts: Cookies. Cakes. Pies. Candy. Seasoning and other foods: Mayonnaise. Premade sauces and marinades. The items listed may not be a complete list. Talk with your dietitian about what dietary  choices are right for you. Summary The Mediterranean diet includes both food and lifestyle choices. Eat a variety of fresh fruits and vegetables, beans, nuts, seeds, and whole grains. Limit the amount of red meat and sweets that you eat. Talk with your health care provider about whether it is safe for you to drink red wine in moderation. This means 1 glass a day for nonpregnant women and 2 glasses a day for men. A glass of wine equals 5 oz (150 mL). This information is not intended to replace advice given to you by your health  care provider. Make sure you discuss any questions you have with your health care provider. Document Released: 06/18/2016 Document Revised: 07/21/2016 Document Reviewed: 06/18/2016 Elsevier Interactive Patient Education  2017 [employer].

## 2023-03-15 NOTE — Progress Notes (Signed)
B12 is on the lower normal, vitamin B12 is low.  Recommend starting on vitamin B12 1000 mcg daily.  Follow-up with PCP.  Thyroid levels are normal Thank you

## 2023-03-17 ENCOUNTER — Ambulatory Visit: Payer: Medicare PPO | Admitting: Nurse Practitioner

## 2023-03-17 ENCOUNTER — Telehealth: Payer: Self-pay | Admitting: Physician Assistant

## 2023-03-17 NOTE — Telephone Encounter (Signed)
Pt  called with Concerns about her metformin if someone can call her?

## 2023-03-17 NOTE — Telephone Encounter (Signed)
Pt called in on 03/16/23 and left a message with the access nurse. She was seen yesterday and was told to take B12. She is wanting to know the dosage she needs to take, caller states she got gummies 1000 mcg. The label states adult dose is 2. Access nurse instructed caller to take the dosage as the label says.   Full access nurse report is in [employer]

## 2023-03-18 ENCOUNTER — Encounter (INDEPENDENT_AMBULATORY_CARE_PROVIDER_SITE_OTHER): Payer: Medicare PPO | Admitting: Nurse Practitioner

## 2023-03-18 ENCOUNTER — Other Ambulatory Visit: Payer: Self-pay

## 2023-03-18 NOTE — Progress Notes (Signed)
Erroneous

## 2023-03-18 NOTE — Telephone Encounter (Signed)
Error

## 2023-03-18 NOTE — Telephone Encounter (Signed)
Lvm for pt to call back about the metformin. KH

## 2023-03-22 ENCOUNTER — Encounter: Payer: Self-pay | Admitting: Dietician

## 2023-03-22 ENCOUNTER — Encounter: Payer: Medicare PPO | Attending: Nurse Practitioner | Admitting: Dietician

## 2023-03-22 VITALS — Wt 135.0 lb

## 2023-03-22 DIAGNOSIS — E119 Type 2 diabetes mellitus without complications: Secondary | ICD-10-CM | POA: Diagnosis not present

## 2023-03-22 NOTE — Progress Notes (Signed)
   Appointment start:  1010 Appointment End:  1045 Patient is here today with her husband.  She was last seen by this RD on 01/12/2023.  Patient attended Diabetes Core Classes 1-3 between 10/13/2022 and 10/20/2022 at [facility].  She missed the third class due to illness. The purpose of the meeting today is to review information learned during those classes as well as review patient application and goals.  History:  Type 2 Diabetes, HIV, HLD, HTN, CAD, neuropathy, noted some concerns of memory issues  A1C:  5.8% 12/16/2022 decreased from 6.8% 03/03/2022 Other labs:  Vitamin B-12 396 on 03/15/2023 Medications include:  Metformin XR Sleep:  10 hours per day Weight:   135 lbs 03/22/2023 137 lbs 01/12/2023 142 lbs 10/13/2022  Blood Glucose:  patient is not testing Social History:  Lives with her husband. Goes to the food bank at times.  She does not work. Exercise:  Membership to the gym but has not gone due to concerns of increased covid rates, feels unsafe walking in her neighborhood, does not like the cold.  Decreased motivation to exercise.  24 hour diet recall: Breakfast:  part of a muffin Snack:   Lunch:  pancakes with sugar free syrup, eggs with cheese, sausage, apples Snack:  none Dinner:  fried chicken, green beans, potato salad Snack:  potato chips (less salt) Beverages:  water, water with lemon, coffee with cream and splenda, tea with splenda   Intervention: Discussed the importance of physical activity most days of the week and problem solved to have patient think of ways to achieve this.  Discussed benefits for memory and blood glucose and overall well being. Discussed current A1C and congratulated patient on changes made. Discussed her diet and need to increase vegetables and fruit  Continuing Goals: Recommendations: Walk for 30 minutes most days. - consider walking with your sister-in-law OR go to the [facility]  Include more vegetables, fresh fruits,  whole grains  Handout:  Heart Healthy Carbohydrate Consistent Nutrition Therapy  Future Follow up:  3 months

## 2023-03-22 NOTE — Patient Instructions (Addendum)
Recommendations: Walk for 30 minutes most days. - consider walking with your sister-in-law OR go to the [facility]  Include more vegetables, fresh fruits, whole grains

## 2023-03-23 ENCOUNTER — Ambulatory Visit (INDEPENDENT_AMBULATORY_CARE_PROVIDER_SITE_OTHER): Payer: Self-pay | Admitting: Clinical

## 2023-03-23 DIAGNOSIS — F32A Depression, unspecified: Secondary | ICD-10-CM

## 2023-03-23 DIAGNOSIS — R454 Irritability and anger: Secondary | ICD-10-CM

## 2023-03-23 NOTE — BH Specialist Note (Signed)
Integrated Behavioral Health Follow Up In-Person Visit  MRN: [id number] Name: Cathy Smith  Number of Integrated Behavioral Health Clinician visits: 5-Fifth Visit  Session Start time: 1030   Session End time: 1100  Total time in minutes: 30   Types of Service: Individual psychotherapy  Interpretor:No. Interpretor Name and Language: none  Subjective: Cathy Smith is a 57 y.o. female accompanied by Spouse Patient was referred by Angus Seller, NP for easily upset and irritated. Patient reports the following symptoms/concerns: easily upset and irritated Duration of problem: several months; Severity of problem: severe  Objective: Mood: Euthymic and Affect: Appropriate Risk of harm to self or others: No plan to harm self or others  Patient and/or Family's Strengths/Protective Factors: Social connections and Concrete supports in place (healthy food, safe environments, etc.)   Goals Addressed: Patient will:  Reduce symptoms of: depression and mood instability Increase knowledge and/or ability of: coping skills and self-management skills  Demonstrate ability to: Increase healthy adjustment to current life circumstances and Begin healthy grieving over loss  Progress towards Goals: Ongoing  Interventions: Interventions utilized:  Supportive Counseling Standardized Assessments completed: Not Needed  Patient was accompanied by her spouse today; they were about 30 min late for the appointment. Discussed neurology appointment patient had 03/15/23. Discussed patient's ongoing goals and if she's interested in mental health counseling going forward. Patient stated she does want to be more active and more social and then began to list reasons why she hasn't been able to do so, such as that she needed her husband to call his sister to ask if she could go for evening walks with them. Talked about taking personal responsibility for behavior change. Encouraged patient to consider if she really  would like to make these changes, including if she would like to continue with mental health counseling. Patient indicated she is open to it. Provided patient with list of therapists in network with her insurance and encouraged her to call some of them before next meeting with CSW.  Patient and/or Family Response: Patient engaged in session.   Assessment: Patient currently experiencing irritability, easy upset, and memory changes.   Patient may benefit from supportive counseling and coping skill development. She may also benefit from further evaluation by another specialist regarding her memory concerns.  Plan: Follow up with behavioral health clinician on: 04/20/23 Behavioral recommendations: increase social activities and exercise Referral(s): Counselor  Abigail Butts, LCSW

## 2023-03-25 ENCOUNTER — Other Ambulatory Visit: Payer: Self-pay | Admitting: Nurse Practitioner

## 2023-03-25 DIAGNOSIS — E119 Type 2 diabetes mellitus without complications: Secondary | ICD-10-CM

## 2023-04-11 ENCOUNTER — Ambulatory Visit
Admission: RE | Admit: 2023-04-11 | Discharge: 2023-04-11 | Disposition: A | Discharge status: Home / Self Care | Payer: Medicare PPO | Source: Ambulatory Visit | Attending: Physician Assistant | Admitting: Physician Assistant

## 2023-04-11 DIAGNOSIS — R413 Other amnesia: Secondary | ICD-10-CM | POA: Diagnosis not present

## 2023-04-13 ENCOUNTER — Ambulatory Visit: Payer: Medicare HMO | Admitting: Infectious Diseases

## 2023-04-13 ENCOUNTER — Other Ambulatory Visit: Payer: Self-pay | Admitting: Infectious Diseases

## 2023-04-13 DIAGNOSIS — B20 Human immunodeficiency virus [HIV] disease: Secondary | ICD-10-CM

## 2023-04-13 LAB — HM DIABETES EYE EXAM

## 2023-04-20 ENCOUNTER — Ambulatory Visit (INDEPENDENT_AMBULATORY_CARE_PROVIDER_SITE_OTHER): Payer: Medicare PPO | Admitting: Clinical

## 2023-04-20 DIAGNOSIS — F3289 Other specified depressive episodes: Secondary | ICD-10-CM

## 2023-04-20 DIAGNOSIS — F32A Depression, unspecified: Secondary | ICD-10-CM | POA: Diagnosis not present

## 2023-04-20 NOTE — BH Specialist Note (Unsigned)
ADULT Comprehensive Clinical Assessment (CCA) Note   04/22/2023 OLLA DELANCEY [id number]   Referring Provider: Angus Seller, NP Session Start time: [phone]    Session End time: 1200  Total time in minutes: 45   SUBJECTIVE: Cathy Smith is a 57 y.o.   female accompanied by Cathy Smith  Cathy Smith was seen in consultation at the request of Cathy Andrew, NP for evaluation of  depression and irritability, memory changes .  Types of Service: Individual psychotherapy and Comprehensive Clinical Assessment (CCA)  Reason for referral in patient/family's own words:  Increasing social activity, figuring out what to do day to day    She likes to be called Cathy Smith.  She came to the appointment by Cathy Smith.  Primary language at home is [place].  Constitutional Appearance: cooperative, well-nourished, well-developed, alert and well-appearing  (Patient to answer as appropriate) Gender identity: female Sex assigned at birth: female Pronouns: she   Mental status exam:   General Appearance /Behavior:  Neat Eye Contact:  Fair Motor Behavior:  Normal Speech:  Normal Level of Consciousness:  Alert Mood:  Euthymic Affect:  Appropriate Anxiety Level:  None Thought Process:  Relevant Thought Content:  WNL Perception:  Normal Judgment:  Good Insight:  Present   Current Medications and therapies: She is taking:   Outpatient Encounter Medications as of 04/20/2023  Medication Sig   darunavir (PREZISTA) 800 MG tablet TAKE 1 TABLET(800 MG) BY MOUTH DAILY WITH BREAKFAST   elvitegravir-cobicistat-emtricitabine-tenofovir (GENVOYA) 150-150-200-10 MG TABS tablet TAKE 1 TABLET BY MOUTH DAILY WITH BREAKFAST   furosemide (LASIX) 40 MG tablet Take one tablet by mouth daily as needed for swelling   hydrOXYzine (ATARAX/VISTARIL) 10 MG tablet Take 1 tablet (10 mg total) by mouth 3 (three) times daily as needed. (Patient not taking: Reported on 02/01/2023)   meclizine (ANTIVERT) 25 MG tablet Take 1 tablet  (25 mg total) by mouth 3 (three) times daily as needed for dizziness. (Patient not taking: Reported on 02/01/2023)   metFORMIN (GLUCOPHAGE-XR) 500 MG 24 hr tablet TAKE 1 TABLET(500 MG) BY MOUTH DAILY WITH BREAKFAST   rosuvastatin (CRESTOR) 10 MG tablet Take 1 tablet (10 mg total) by mouth daily.   No facility-administered encounter medications on file as of 04/20/2023.     Therapies:  Behavioral therapy  Family history: Family mental illness:   none Family school achievement history:   patient completed high school Other relevant family history:   none  Social History: Now living with  Cathy Smith . Employment:  Not employed Religious or Spiritual Beliefs: Christian  Negative Mood Concerns She does not make negative statements about self. Self-injury:  No Suicidal ideation:  No Suicide attempt:  No  Additional Anxiety Concerns: Panic attacks:  No Obsessions:  No Compulsions:  No  Stressors:  Lack of social engagement and activities.  Alcohol and/or Substance Use: Have you recently consumed alcohol? no  Have you recently used any drugs?  no  Have you recently consumed any tobacco? no Does patient seem concerned about dependence or abuse of any substance? no  Substance Use Disorder Checklist:  N/A  Severity Risk Scoring based on DSM-5 Criteria for Substance Use Disorder. The presence of at least two (2) criteria in the last 12 months indicate a substance use disorder. The severity of the substance use disorder is defined as:  Mild: Presence of 2-3 criteria Moderate: Presence of 4-5 criteria Severe: Presence of 6 or more criteria  Traumatic Experiences: History or current traumatic events (natural disaster, house fire,  etc.)? no History or current physical trauma?  no History or current emotional trauma?  no History or current sexual trauma?  no History or current domestic or intimate partner violence?  no History of bullying:  no  Risk Assessment: Suicidal or  homicidal thoughts?   no Self injurious behaviors?  no Guns in the home?  no  Self Harm Risk Factors: Social withdrawal/isolation  Self Harm Thoughts?: No  Patient and/or Family's Strengths/Protective Factors: Social connections and Concrete supports in place (healthy food, safe environments, etc.)   Patient's and/or Family's Goals in their own words: Increasing social activity, figuring out what to do day to day  Interventions: Interventions utilized:  Mining engineer, Supportive Counseling, and Psychoeducation and/or Health Education   Patient and/or Family Response: Patient engaged in session.   Standardized Assessments completed: Not Needed  Patient Centered Plan: Patient is on the following Treatment Plan(s):  behavioral activation for depression, increasing social activity  Coordination of Care:  Coordination with PCP as needed.  DSM-5 Diagnosis: F32.89 Other Depression  Recommendations for Services/Supports/Treatments: Behavioral activation, CBT, motivational interviewing; referral to longer term therapist  Progress towards Goals: Ongoing  Treatment Plan Summary: Behavioral Health Clinician will: Assess individual's status and evaluate for psychiatric symptoms, Provide coping skills enhancement, Utilize evidence based practices to address psychiatric symptoms, and Provide therapeutic counseling and medication monitoring  Individual will: Complete all homework and actively participate during therapy, Report any thoughts or plans of harming themselves or others, and Utilize coping skills taught in therapy to reduce symptoms  Referral(s): Counselor  Abigail Butts, LCSW

## 2023-04-22 ENCOUNTER — Encounter: Payer: Self-pay | Admitting: Physician Assistant

## 2023-04-22 ENCOUNTER — Ambulatory Visit (INDEPENDENT_AMBULATORY_CARE_PROVIDER_SITE_OTHER): Payer: Medicare PPO | Admitting: Physician Assistant

## 2023-04-22 VITALS — BP 106/79 | Resp 18 | Ht 61.0 in | Wt 130.0 lb

## 2023-04-22 DIAGNOSIS — R413 Other amnesia: Secondary | ICD-10-CM | POA: Diagnosis not present

## 2023-04-22 NOTE — Patient Instructions (Signed)
Referral to Neuropsych  Follow up depends on results of neuropsych

## 2023-04-22 NOTE — Progress Notes (Signed)
Assessment/Plan:   Memory difficulties of unclear etiology.  Cathy Smith is a very pleasant 57 y.o. 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 These were personally reviewed, remarkable for normal volume for age, and very mild chronic microvascular ischemic disease.  No atrophy is noted.  Patient was last seen on 03/15/2023 with [facility] was 22/30.  This patient is accompanied in the office by her husband who supplements the history.  Previous records as well as any outside records available were reviewed prior to todays visit.     Follow up pending on the results of the neuropsychological evaluation Continue B12 replenishment Referral to Neuropsych testing Outside facility as the patient is under 75 years old, for clarity of the diagnosis and to determine any other causes of memory loss including depression, anxiety, attention, sleep, etc. Continue to control mood as per PCP, and Behavioral Therapy  Increase physical activity level as well as social activity for cognitive stimulation Recommend good control of cardiovascular risk factors  Initial visit 03/15/2023 How long did patient have memory difficulties? "It was the PCP's staff".  Husband reports that the patient has some difficulty remembering recent conversations and people names but"not bad". Enjoys word finding names.  repeats oneself?  Endorsed. I have a tendency to repeat things but I don't do it all the time Disoriented when walking into a room?  Patient denies  Leaving objects in unusual places? denies   Wandering behavior?  denies   Any personality changes ? Endorsed, easily irritable and upset followed by [facility].  Hallucinations or paranoia?  Patient denies   Seizures?   Patient denies    Any sleep changes?  Denies. Reports occasionally having  vivid dreams, REM behavior or sleepwalking   Sleep apnea?  Patient denies   Any hygiene concerns?  Patient denies   Independent of bathing and dressing?  Endorsed  Does the patient needs help with medications? Husband  is in charge   Who is in charge of the finances?  Husband is in charge     Any changes in appetite?  denies     Patient have trouble swallowing? denies   Does the patient cook? Simple cooking  Any headaches?   denies   Chronic back pain ? denies   Ambulates with difficulty?  denies   Recent falls or head injuries? denies   Vision changes? denies   Unilateral weakness, numbness or tingling? denies   Any tremors?   denies   Any anosmia?  denies   Any incontinence of urine? denies   Any bowel dysfunction? denies      Patient lives  with husband History of heavy alcohol intake? denies   History of heavy tobacco use? denies   Family history of dementia? 1 paternal aunt with dementia of unknown type Does patient drive? Never drove.   CURRENT MEDICATIONS:  Outpatient Encounter Medications as of 04/22/2023  Medication Sig   darunavir (PREZISTA) 800 MG tablet TAKE 1 TABLET(800 MG) BY MOUTH DAILY WITH BREAKFAST   elvitegravir-cobicistat-emtricitabine-tenofovir (GENVOYA) 150-150-200-10 MG TABS tablet TAKE 1 TABLET BY MOUTH DAILY WITH BREAKFAST   furosemide (LASIX) 40 MG tablet Take one tablet by mouth daily as needed for swelling   hydrOXYzine (ATARAX/VISTARIL) 10 MG tablet Take 1 tablet (10 mg total) by mouth 3 (three) times daily as needed. (Patient  not taking: Reported on 02/01/2023)   meclizine (ANTIVERT) 25 MG tablet Take 1 tablet (25 mg total) by mouth 3 (three) times daily as needed for dizziness. (Patient not taking: Reported on 02/01/2023)   metFORMIN (GLUCOPHAGE-XR) 500 MG 24 hr tablet TAKE 1 TABLET(500 MG) BY MOUTH DAILY WITH BREAKFAST   metoprolol tartrate (LOPRESSOR) 25 MG tablet Take 1.5 tablets (37.5 mg total) by mouth 2 (two) times daily.   rosuvastatin (CRESTOR)  10 MG tablet Take 1 tablet (10 mg total) by mouth daily.   [DISCONTINUED] atorvastatin (LIPITOR) 20 MG tablet TAKE 1 TABLET(20 MG) BY MOUTH DAILY AT 6 PM   No facility-administered encounter medications on file as of 04/22/2023.        No data to display            03/15/2023   11:00 AM  Montreal Cognitive Assessment   Visuospatial/ Executive (0/5) 2  Naming (0/3) 3  Attention: Read list of digits (0/2) 1  Attention: Read list of letters (0/1) 1  Attention: Serial 7 subtraction starting at 100 (0/3) 0  Language: Repeat phrase (0/2) 1  Language : Fluency (0/1) 1  Abstraction (0/2) 1  Delayed Recall (0/5) 5  Orientation (0/6) 6  Total 21  Adjusted Score (based on education) 22   Thank you for allowing [place] the opportunity to participate in the care of this nice patient. Please do not hesitate to contact us for any questions or concerns.   Total time spent on today's visit was 34 minutes dedicated to this patient today, preparing to see patient, examining the patient, ordering tests and/or medications and counseling the patient, documenting clinical information in the EHR or other health record, independently interpreting results and communicating results to the patient/family, discussing treatment and goals, answering patient's questions and coordinating care.  Cc:  Ivonne Andrew, NP  [name] 04/22/2023 6:59 AM

## 2023-04-27 ENCOUNTER — Other Ambulatory Visit: Payer: Medicare PPO

## 2023-04-29 ENCOUNTER — Telehealth: Payer: Self-pay | Admitting: *Deleted

## 2023-04-29 NOTE — Telephone Encounter (Signed)
Cathy Smith returned call and is aware of her appointment that was scheduled for Tuesday, August 13 th at 3:00 with Dr. Tamela Oddi. Patient agreed to date and time and had no further questions or concerns at this time.

## 2023-04-29 NOTE — Telephone Encounter (Signed)
Cathy Smith called the office to see if she had any scheduled appointments. Per Dr. Jean Rosenthal Moore's progress note patient was seen on 09/10/2021 and the recommendations at that time were 6 month follow up. Attempted to reach patient to relay message that the office gave her an appointment for Tuesday, August 13th. At 3pm with a 2:45 check in time. Left voicemail for patient to call the office back at [phone] to confirm her appointment date and time.

## 2023-05-06 ENCOUNTER — Other Ambulatory Visit: Payer: Self-pay

## 2023-05-06 ENCOUNTER — Ambulatory Visit: Payer: Medicare PPO | Admitting: Infectious Diseases

## 2023-05-06 ENCOUNTER — Encounter: Payer: Self-pay | Admitting: Infectious Diseases

## 2023-05-06 VITALS — BP 124/85 | HR 69 | Temp 97.2°F | Resp 16 | Ht 61.5 in | Wt 133.8 lb

## 2023-05-06 DIAGNOSIS — E119 Type 2 diabetes mellitus without complications: Secondary | ICD-10-CM

## 2023-05-06 DIAGNOSIS — R7303 Prediabetes: Secondary | ICD-10-CM

## 2023-05-06 DIAGNOSIS — Z7984 Long term (current) use of oral hypoglycemic drugs: Secondary | ICD-10-CM | POA: Diagnosis not present

## 2023-05-06 DIAGNOSIS — E781 Pure hyperglyceridemia: Secondary | ICD-10-CM

## 2023-05-06 DIAGNOSIS — D071 Carcinoma in situ of vulva: Secondary | ICD-10-CM

## 2023-05-06 DIAGNOSIS — B20 Human immunodeficiency virus [HIV] disease: Secondary | ICD-10-CM | POA: Diagnosis not present

## 2023-05-06 LAB — GLUCOSE, POCT (MANUAL RESULT ENTRY): POC Glucose: 103 mg/dl — AB (ref 70–99)

## 2023-05-06 MED ORDER — GENVOYA 150-150-200-10 MG PO TABS
1.0000 | ORAL_TABLET | Freq: Every day | ORAL | 11 refills | Status: DC
Start: 2023-05-06 — End: 2024-05-09

## 2023-05-06 MED ORDER — DARUNAVIR 800 MG PO TABS: 800.0000 mg | ORAL_TABLET | Freq: Every day | ORAL | 11 refills | Status: DC

## 2023-05-06 NOTE — Assessment & Plan Note (Signed)
Lipid panel reviewed - would need to keep the rosuvastatin maxed at 10 mg given augmented effect when co administered with genvoya (38 - 80% augmentation in PK studies).

## 2023-05-06 NOTE — Assessment & Plan Note (Signed)
FU with GYN scheduled in August. She was aware of her appt.

## 2023-05-06 NOTE — Assessment & Plan Note (Signed)
Long term good control on genvoya + prezista. No significant medication interactions - her statin is dosed appropriately for coadministration.  GYN appt for VAIN follow up in Aug She would feel comfortable with annual follow ups. Lab orders placed for 1 year.   Recommend flu + covid boosters in fall (~September).

## 2023-05-06 NOTE — Assessment & Plan Note (Signed)
Will message Angus Seller to let her know of trouble with glucometer and desire for possible continuous glucose monitor to help with better understanding of impact of high sugar foods on her blood sugar. Her husband thinks that this may be valuable to help with better insight. She would benefit from ongoing conversations with nutrition team.

## 2023-05-06 NOTE — Progress Notes (Signed)
Name: Cathy Smith  DOB: 1966-06-23 MRN: [id number] PCP: Ivonne Andrew, NP    Brief Narrative:  Cathy Smith  is a 57 y.o. female with well controlled HIV disease, Dx 2005 per chart review. CD4 nadir 60 VL 12,300  HIV Risk: HIV+ husband, heterosexual History of OIs: vulvar cancer  Intake Labs 2021: Hep B sAg (-), sAb (-), cAb (-); Hep A (not done), Hep C (not done) Quantiferon () HLA B*5701 (-) G6PD: ()   Previous Regimens: Genvoya + Prezista   Genotypes: 2008 - P14S, V35I, S68G, R83K, V106I,  I178M, G196E, [id number], T200I, L214F, E297K, D324E;  V3I, T4S, S37N, L63P  2009 - M184V  2010 - V106I   Cumulative Genotype:  RTI Resistance Mutations: M184V NNRTI Resistance Mutations: V106I  Nucleoside Reverse Transcriptase Inhibitors abacavir (ABC) Low-Level Resistance zidovudine (AZT) Susceptible emtricitabine (FTC) High-Level Resistance lamivudine (3TC) High-Level Resistance tenofovir (TDF) Susceptible  Non-nucleoside Reverse Transcriptase Inhibitors doravirine (DOR) Potential Low-Level Resistance efavirenz (EFV) Susceptible etravirine (ETR) Potential Low-Level Resistance nevirapine (NVP) Potential Low-Level Resistance rilpivirine (RPV) Potential Low-Level Resistance  PI Major Resistance Mutations: None PI Accessory Resistance Mutations: None  Protease Inhibitors atazanavir/r (ATV/r) Susceptible darunavir/r (DRV/r) Susceptible lopinavir/r (LPV/r) Susceptible   Subjective:   Chief Complaint  Patient presents with   Follow-up    B20      HPI: Having trouble with her glucometer. Her husband is wondering if we can check her blood sugar today because she seemed a little off and sugar cravings have been high lately. She continues on her genvoya + prezista once daily together with food w/o issue. No trouble with access.  Continues on crestor 10 mg daily.   She has a history of vulvovaginal dysplasia (VIN III). FU with GYN scheduled in August - last vaginal  cytology was negative in 2023.     Review of Systems  Constitutional:  Negative for chills and fever.  HENT:  Negative for tinnitus.   Eyes:  Negative for blurred vision and photophobia.  Respiratory:  Negative for cough and sputum production.   Cardiovascular:  Negative for chest pain.  Gastrointestinal:  Negative for diarrhea, nausea and vomiting.  Genitourinary:  Negative for dysuria.  Skin:  Negative for rash.  Neurological:  Negative for headaches.      Past Medical History:  Diagnosis Date   Coronary artery disease    History of condyloma acuminatum    History of uterine fibroid    History of vaginal dysplasia    VAIN III--  oncologist-  dr Andrey Farmer   History of vulvar dysplasia    VIN III   gyn-oncologist-  dr Andrey Farmer   HIV infection [facility] hatcher   Hypertension    Hypertriglyceridemia without hypercholesterolemia    PONV (postoperative nausea and vomiting)    Type 2 diabetes mellitus without complication, without long-term current use of insulin (HCC) 09/02/2022   VIN III (vulvar intraepithelial neoplasia III)    Wears glasses     Outpatient Medications Prior to Visit  Medication Sig Dispense Refill   furosemide (LASIX) 40 MG tablet Take one tablet by mouth daily as needed for swelling 90 tablet 3   hydrOXYzine (ATARAX/VISTARIL) 10 MG tablet Take 1 tablet (10 mg total) by mouth 3 (three) times daily as needed. 90 tablet 1   meclizine (ANTIVERT) 25 MG tablet Take 1 tablet (25 mg total) by mouth 3 (three) times daily as needed for dizziness. 30 tablet 0   metFORMIN (GLUCOPHAGE-XR)  500 MG 24 hr tablet TAKE 1 TABLET(500 MG) BY MOUTH DAILY WITH BREAKFAST 90 tablet 0   rosuvastatin (CRESTOR) 10 MG tablet Take 1 tablet (10 mg total) by mouth daily. 90 tablet 3   darunavir (PREZISTA) 800 MG tablet TAKE 1 TABLET(800 MG) BY MOUTH DAILY WITH BREAKFAST 30 tablet 0   elvitegravir-cobicistat-emtricitabine-tenofovir (GENVOYA) 150-150-200-10  MG TABS tablet TAKE 1 TABLET BY MOUTH DAILY WITH BREAKFAST 30 tablet 0   metoprolol tartrate (LOPRESSOR) 25 MG tablet Take 1.5 tablets (37.5 mg total) by mouth 2 (two) times daily. 90 tablet 0   No facility-administered medications prior to visit.     No Known Allergies  Social History   Tobacco Use   Smoking status: Never   Smokeless tobacco: Never  Vaping Use   Vaping Use: Never used  Substance Use Topics   Alcohol use: No    Alcohol/week: 0.0 standard drinks of alcohol   Drug use: No    Family History  Problem Relation Age of Onset   Cataracts Mother    Hypertension Father    CAD Paternal Grandfather    Colon cancer Neg Hx     Social History   Substance and Sexual Activity  Sexual Activity Yes   Partners: Male   Birth control/protection: Condom   Comment: accepted condoms     Objective:   Vitals:   05/06/23 1059  BP: 124/85  Pulse: 69  Resp: 16  Temp: (!) 97.2 F (36.2 C)  TempSrc: Temporal  SpO2: 100%  Weight: 133 lb 12.8 oz (60.7 kg)  Height: 5' 1.5" (1.562 m)   Body mass index is 24.87 kg/m.   Physical Exam Constitutional:      Appearance: Normal appearance. She is not ill-appearing.  HENT:     Mouth/Throat:     Mouth: Mucous membranes are moist.     Pharynx: Oropharynx is clear.  Eyes:     General: No scleral icterus. Pulmonary:     Effort: Pulmonary effort is normal.  Neurological:     Mental Status: She is oriented to person, place, and time.  Psychiatric:        Mood and Affect: Mood normal.        Thought Content: Thought content normal.     Lab Results Lab Results  Component Value Date   WBC 3.4 02/05/2023   HGB 12.0 02/05/2023   HCT 35.1 02/05/2023   MCV 96 02/05/2023   PLT 167 02/05/2023    Lab Results  Component Value Date   CREATININE 0.66 02/05/2023   BUN 12 02/05/2023   NA 141 02/05/2023   K 3.4 (L) 02/05/2023   CL 101 02/05/2023   CO2 24 02/05/2023    Lab Results  Component Value Date   ALT 10  02/05/2023   AST 16 02/05/2023   ALKPHOS 104 02/05/2023   BILITOT <0.2 02/05/2023    Lab Results  Component Value Date   CHOL 119 12/16/2022   HDL 34 (L) 12/16/2022   LDLCALC 58 12/16/2022   TRIG 159 (H) 12/16/2022   CHOLHDL 3.5 12/16/2022   HIV 1 RNA Quant  Date Value  10/19/2022 Not Detected Copies/mL  04/07/2022 CANCELED  08/04/2021 Not Detected Copies/mL   CD4 T Cell Abs (/uL)  Date Value  10/19/2022 489  04/07/2022 493  08/04/2021 378 (L)     Assessment & Plan:   Problem List Items Addressed This Visit       Unprioritized   Human immunodeficiency virus (HIV) disease (HCC) (Chronic)  Long term good control on genvoya + prezista. No significant medication interactions - her statin is dosed appropriately for coadministration.  GYN appt for VAIN follow up in Aug She would feel comfortable with annual follow ups. Lab orders placed for 1 year.   Recommend flu + covid boosters in fall (~September).       Relevant Medications   darunavir (PREZISTA) 800 MG tablet   elvitegravir-cobicistat-emtricitabine-tenofovir (GENVOYA) 150-150-200-10 MG TABS tablet   Hypertriglyceridemia without hypercholesterolemia    Lipid panel reviewed - would need to keep the rosuvastatin maxed at 10 mg given augmented effect when co administered with genvoya (38 - 80% augmentation in PK studies).       RESOLVED: Prediabetes   Relevant Orders   POCT Glucose (CBG) (Completed)   Type 2 diabetes mellitus without complication, without long-term current use of insulin (HCC)    Will message Angus Seller to let her know of trouble with glucometer and desire for possible continuous glucose monitor to help with better understanding of impact of high sugar foods on her blood sugar. Her husband thinks that this may be valuable to help with better insight. She would benefit from ongoing conversations with nutrition team.       VIN III (vulvar intraepithelial neoplasia III)    FU with GYN scheduled  in August. She was aware of her appt.       Other Visit Diagnoses     HIV disease (HCC)    -  Primary   Relevant Medications   darunavir (PREZISTA) 800 MG tablet   elvitegravir-cobicistat-emtricitabine-tenofovir (GENVOYA) 150-150-200-10 MG TABS tablet   Other Relevant Orders   HIV 1 RNA quant-no reflex-bld   CBC   COMPLETE METABOLIC PANEL WITH GFR   HIV 1 RNA quant-no reflex-bld   Lipid panel   T-helper cells (CD4) count   RPR      Rexene Alberts, MSN, NP-C [facility] Pager: [phone] Office: [phone]  05/06/23  11:51 AM

## 2023-05-06 NOTE — Patient Instructions (Addendum)
Nice to see you and nice to meet your husband.   Will refill your genvoya and prezista and have you back in 1 year with labs collected prior to our visit.   Will send Archie Patten a message to talk with you more about a possible continuous glucose monitor to help keep a close eye on your blood sugars.

## 2023-05-08 LAB — HIV-1 RNA QUANT-NO REFLEX-BLD
HIV 1 RNA Quant: NOT DETECTED Copies/mL
HIV-1 RNA Quant, Log: NOT DETECTED Log cps/mL

## 2023-05-10 ENCOUNTER — Ambulatory Visit: Payer: Medicare PPO | Admitting: Clinical

## 2023-05-12 ENCOUNTER — Encounter: Payer: Self-pay | Admitting: Nurse Practitioner

## 2023-05-12 ENCOUNTER — Ambulatory Visit (INDEPENDENT_AMBULATORY_CARE_PROVIDER_SITE_OTHER): Payer: Medicare PPO | Admitting: Nurse Practitioner

## 2023-05-12 VITALS — BP 127/72 | HR 72 | Wt 138.0 lb

## 2023-05-12 DIAGNOSIS — E119 Type 2 diabetes mellitus without complications: Secondary | ICD-10-CM | POA: Diagnosis not present

## 2023-05-12 LAB — POCT GLYCOSYLATED HEMOGLOBIN (HGB A1C): Hemoglobin A1C: 5.5 % (ref 4.0–5.6)

## 2023-05-12 MED ORDER — BLOOD GLUCOSE TEST VI STRP
1.0000 | ORAL_STRIP | Freq: Three times a day (TID) | 0 refills | Status: AC
Start: 2023-05-12 — End: 2023-06-11

## 2023-05-12 MED ORDER — LANCETS MISC. MISC
1.0000 | Freq: Three times a day (TID) | 0 refills | Status: AC
Start: 2023-05-12 — End: 2023-06-11

## 2023-05-12 MED ORDER — BLOOD GLUCOSE MONITORING SUPPL DEVI
1.0000 | Freq: Three times a day (TID) | 0 refills | Status: AC
Start: 2023-05-12 — End: ?

## 2023-05-12 MED ORDER — LANCET DEVICE MISC
1.0000 | Freq: Three times a day (TID) | 0 refills | Status: AC
Start: 2023-05-12 — End: 2023-06-11

## 2023-05-12 NOTE — Assessment & Plan Note (Signed)
-   POCT glycosylated hemoglobin (Hb A1C) - Blood Glucose Monitoring Suppl DEVI; 1 each by Does not apply route in the morning, at noon, and at bedtime. May substitute to any manufacturer covered by patient's insurance.  Dispense: 1 each; Refill: 0 - Glucose Blood (BLOOD GLUCOSE TEST STRIPS) STRP; 1 each by In Vitro route in the morning, at noon, and at bedtime. May substitute to any manufacturer covered by patient's insurance.  Dispense: 100 strip; Refill: 0 - Lancet Device MISC; 1 each by Does not apply route in the morning, at noon, and at bedtime. May substitute to any manufacturer covered by patient's insurance.  Dispense: 1 each; Refill: 0 - Lancets Misc. MISC; 1 each by Does not apply route in the morning, at noon, and at bedtime. May substitute to any manufacturer covered by patient's insurance.  Dispense: 100 each; Refill: 0 - CBC - Comprehensive metabolic panel   Follow up:  Follow up in 3 months

## 2023-05-12 NOTE — Patient Instructions (Signed)
1. Type 2 diabetes mellitus without complication, without long-term current use of insulin (HCC)  - POCT glycosylated hemoglobin (Hb A1C) - Blood Glucose Monitoring Suppl DEVI; 1 each by Does not apply route in the morning, at noon, and at bedtime. May substitute to any manufacturer covered by patient's insurance.  Dispense: 1 each; Refill: 0 - Glucose Blood (BLOOD GLUCOSE TEST STRIPS) STRP; 1 each by In Vitro route in the morning, at noon, and at bedtime. May substitute to any manufacturer covered by patient's insurance.  Dispense: 100 strip; Refill: 0 - Lancet Device MISC; 1 each by Does not apply route in the morning, at noon, and at bedtime. May substitute to any manufacturer covered by patient's insurance.  Dispense: 1 each; Refill: 0 - Lancets Misc. MISC; 1 each by Does not apply route in the morning, at noon, and at bedtime. May substitute to any manufacturer covered by patient's insurance.  Dispense: 100 each; Refill: 0 - CBC - Comprehensive metabolic panel   Follow up:  Follow up in 3 months

## 2023-05-12 NOTE — Progress Notes (Signed)
@Patient  ID: Cathy Smith, female    DOB: May 14, 1966, 57 y.o.   MRN: [id number]  Chief Complaint  Patient presents with   Diabetes    Follow up    Referring provider: Ivonne Andrew, NP   HPI  Cathy Smith 57 y.o. female  has a past medical history of Coronary artery disease, History of condyloma acuminatum, History of uterine fibroid, History of vaginal dysplasia, History of vulvar dysplasia, HIV infection (HCC) ([facility]  dr hatcher), Hypertension, Hypertriglyceridemia without hypercholesterolemia, PONV (postoperative nausea and vomiting), VIN III (vulvar intraepithelial neoplasia III), and Wears glasses. To the [facility] for reevaluation of hypertension.   Hypertension: Patient here for follow-up of elevated blood pressure. She is not exercising and is adherent to low salt diet.  Blood pressure is well controlled at home. Cardiac symptoms none. Patient denies none.  Cardiovascular risk factors: hypertension, obesity (BMI >= 30 kg/m2), and sedentary lifestyle. Use of agents associated with hypertension: none. History of target organ damage: none. States that she regularly takes B/P at home and it is similar to today's values. Currently compliant with all medications.   Diabetes: Presents for follow up on diabetes. Currently on metformin for diabetes - working on diabetic diet. Patient has been to nutritionist, but states that she is still eating unhealthy. Did discuss healthy food choices today. A1c in office today was 5.5. Denies f/c/s, n/v/d, hemoptysis, PND, leg swelling Denies chest pain or edema     Denies f/c/s, n/v/d, hemoptysis, PND, leg swelling Denies chest pain or edema     No Known Allergies  Immunization History  Administered Date(s) Administered   COVID-19, mRNA, vaccine(Comirnaty)12 years and older 08/26/2022   H1N1 10/18/2008   Hepatitis B 01/13/2001, 02/02/2001, 03/02/2001, 11/16/2001   Hepatitis B, ADULT 02/05/2014, 08/08/2014,  02/11/2015   Influenza Split 09/02/2011, 12/20/2012   Influenza Whole 08/30/2006, 08/03/2007, 09/24/2008, 08/07/2009, 09/11/2010   Influenza,inj,Quad PF,6+ Mos 08/02/2013, 08/08/2014, 08/27/2015, 08/19/2016, 09/20/2017, 08/15/2018, 08/16/2019, 08/12/2020, 08/04/2021, 08/26/2022   Meningococcal Mcv4o 04/21/2017   PFIZER(Purple Top)SARS-COV-2 Vaccination 01/25/2020, 02/19/2020, 08/30/2020   PNEUMOCOCCAL CONJUGATE-20 04/07/2022   Pfizer Covid-19 Vaccine Bivalent Booster 65yrs & up 08/04/2021   Pneumococcal Conjugate-13 01/13/2016   Pneumococcal Polysaccharide-23 08/30/2006, 12/31/2010   Tdap 09/06/2014    Past Medical History:  Diagnosis Date   Coronary artery disease    History of condyloma acuminatum    History of uterine fibroid    History of vaginal dysplasia    VAIN III--  oncologist-  dr Andrey Farmer   History of vulvar dysplasia    VIN III   gyn-oncologist-  dr Andrey Farmer   HIV infection [facility]) [facility]-  dr hatcher   Hypertension    Hypertriglyceridemia without hypercholesterolemia    PONV (postoperative nausea and vomiting)    Type 2 diabetes mellitus without complication, without long-term current use of insulin (HCC) 09/02/2022   VIN III (vulvar intraepithelial neoplasia III)    Wears glasses     Tobacco History: Social History   Tobacco Use  Smoking Status Never  Smokeless Tobacco Never   Counseling given: Not Answered   Outpatient Encounter Medications as of 05/12/2023  Medication Sig   Blood Glucose Monitoring Suppl DEVI 1 each by Does not apply route in the morning, at noon, and at bedtime. May substitute to any manufacturer covered by patient's insurance.   darunavir (PREZISTA) 800 MG tablet Take 1 tablet (800 mg total) by mouth daily with breakfast.   elvitegravir-cobicistat-emtricitabine-tenofovir (GENVOYA) 150-150-200-10 MG  TABS tablet Take 1 tablet by mouth daily with breakfast.   furosemide (LASIX) 40 MG tablet Take one tablet by mouth  daily as needed for swelling   Glucose Blood (BLOOD GLUCOSE TEST STRIPS) STRP 1 each by In Vitro route in the morning, at noon, and at bedtime. May substitute to any manufacturer covered by patient's insurance.   hydrOXYzine (ATARAX/VISTARIL) 10 MG tablet Take 1 tablet (10 mg total) by mouth 3 (three) times daily as needed.   Lancet Device MISC 1 each by Does not apply route in the morning, at noon, and at bedtime. May substitute to any manufacturer covered by patient's insurance.   Lancets Misc. MISC 1 each by Does not apply route in the morning, at noon, and at bedtime. May substitute to any manufacturer covered by patient's insurance.   meclizine (ANTIVERT) 25 MG tablet Take 1 tablet (25 mg total) by mouth 3 (three) times daily as needed for dizziness.   metFORMIN (GLUCOPHAGE-XR) 500 MG 24 hr tablet TAKE 1 TABLET(500 MG) BY MOUTH DAILY WITH BREAKFAST   metoprolol tartrate (LOPRESSOR) 25 MG tablet Take 1.5 tablets (37.5 mg total) by mouth 2 (two) times daily.   rosuvastatin (CRESTOR) 10 MG tablet Take 1 tablet (10 mg total) by mouth daily.   [DISCONTINUED] atorvastatin (LIPITOR) 20 MG tablet TAKE 1 TABLET(20 MG) BY MOUTH DAILY AT 6 PM   No facility-administered encounter medications on file as of 05/12/2023.     Review of Systems  Review of Systems  Constitutional: Negative.   HENT: Negative.    Cardiovascular: Negative.   Gastrointestinal: Negative.   Allergic/Immunologic: Negative.   Neurological: Negative.   Psychiatric/Behavioral: Negative.         Physical Exam  BP 127/72   Pulse 72   Wt 138 lb (62.6 kg)   SpO2 100%   BMI 25.65 kg/m   Wt Readings from Last 5 Encounters:  05/12/23 138 lb (62.6 kg)  05/06/23 133 lb 12.8 oz (60.7 kg)  04/22/23 130 lb (59 kg)  03/22/23 135 lb (61.2 kg)  03/18/23 135 lb 3.2 oz (61.3 kg)     Physical Exam Vitals and nursing note reviewed.  Constitutional:      General: She is not in acute distress.    Appearance: She is  well-developed.  Cardiovascular:     Rate and Rhythm: Normal rate and regular rhythm.  Pulmonary:     Effort: Pulmonary effort is normal.     Breath sounds: Normal breath sounds.  Neurological:     Mental Status: She is alert and oriented to person, place, and time.      Lab Results:  CBC    Component Value Date/Time   WBC 3.4 02/05/2023 0803   WBC 3.7 (L) 08/04/2021 1013   RBC 3.66 (L) 02/05/2023 0803   RBC 3.88 08/04/2021 1013   HGB 12.0 02/05/2023 0803   HCT 35.1 02/05/2023 0803   PLT 167 02/05/2023 0803   MCV 96 02/05/2023 0803   MCH 32.8 02/05/2023 0803   MCH 32.5 08/04/2021 1013   MCHC 34.2 02/05/2023 0803   MCHC 33.3 08/04/2021 1013   RDW 11.4 (L) 02/05/2023 0803   LYMPHSABS 1.7 09/02/2022 1129   MONOABS 408 04/13/2017 1122   EOSABS 0.1 09/02/2022 1129   BASOSABS 0.0 09/02/2022 1129    BMET    Component Value Date/Time   NA 141 02/05/2023 0803   K 3.4 (L) 02/05/2023 0803   CL 101 02/05/2023 0803   CO2 24 02/05/2023 0803  GLUCOSE 72 02/05/2023 0803   GLUCOSE 130 (H) 08/04/2021 1013   BUN 12 02/05/2023 0803   CREATININE 0.66 02/05/2023 0803   CREATININE 1.10 (H) 08/04/2021 1013   CALCIUM 9.2 02/05/2023 0803   GFRNONAA 65 11/15/2020 1403   GFRNONAA 68 08/01/2019 0908   GFRAA 76 11/15/2020 1403   GFRAA 79 08/01/2019 0908     Assessment & Plan:   Type 2 diabetes mellitus without complication, without long-term current use of insulin (HCC) - POCT glycosylated hemoglobin (Hb A1C) - Blood Glucose Monitoring Suppl DEVI; 1 each by Does not apply route in the morning, at noon, and at bedtime. May substitute to any manufacturer covered by patient's insurance.  Dispense: 1 each; Refill: 0 - Glucose Blood (BLOOD GLUCOSE TEST STRIPS) STRP; 1 each by In Vitro route in the morning, at noon, and at bedtime. May substitute to any manufacturer covered by patient's insurance.  Dispense: 100 strip; Refill: 0 - Lancet Device MISC; 1 each by Does not apply route in the  morning, at noon, and at bedtime. May substitute to any manufacturer covered by patient's insurance.  Dispense: 1 each; Refill: 0 - Lancets Misc. MISC; 1 each by Does not apply route in the morning, at noon, and at bedtime. May substitute to any manufacturer covered by patient's insurance.  Dispense: 100 each; Refill: 0 - CBC - Comprehensive metabolic panel   Follow up:  Follow up in 3 months     Ivonne Andrew, NP 05/12/2023

## 2023-05-13 LAB — COMPREHENSIVE METABOLIC PANEL
ALT: 21 IU/L (ref 0–32)
AST: 19 IU/L (ref 0–40)
Albumin: 4.7 g/dL (ref 3.8–4.9)
Alkaline Phosphatase: 75 IU/L (ref 44–121)
BUN/Creatinine Ratio: 12 (ref 9–23)
BUN: 11 mg/dL (ref 6–24)
Bilirubin Total: 0.4 mg/dL (ref 0.0–1.2)
CO2: 24 mmol/L (ref 20–29)
Calcium: 9.7 mg/dL (ref 8.7–10.2)
Chloride: 104 mmol/L (ref 96–106)
Creatinine, Ser: 0.89 mg/dL (ref 0.57–1.00)
Globulin, Total: 2.7 g/dL (ref 1.5–4.5)
Glucose: 99 mg/dL (ref 70–99)
Potassium: 4.3 mmol/L (ref 3.5–5.2)
Sodium: 142 mmol/L (ref 134–144)
Total Protein: 7.4 g/dL (ref 6.0–8.5)
eGFR: 75 mL/min/{1.73_m2} (ref >59)

## 2023-05-13 LAB — CBC
Hematocrit: 36.8 % (ref 34.0–46.6)
Hemoglobin: 12.3 g/dL (ref 11.1–15.9)
MCH: 32.3 pg (ref 26.6–33.0)
MCHC: 33.4 g/dL (ref 31.5–35.7)
MCV: 97 fL (ref 79–97)
Platelets: 179 10*3/uL (ref 150–450)
RBC: 3.81 x10E6/uL (ref 3.77–5.28)
RDW: 11.5 % — ABNORMAL LOW (ref 11.7–15.4)
WBC: 4.1 10*3/uL (ref 3.4–10.8)

## 2023-05-14 ENCOUNTER — Other Ambulatory Visit: Payer: Self-pay | Admitting: Nurse Practitioner

## 2023-05-20 ENCOUNTER — Other Ambulatory Visit: Payer: Self-pay

## 2023-05-20 DIAGNOSIS — E119 Type 2 diabetes mellitus without complications: Secondary | ICD-10-CM

## 2023-05-20 MED ORDER — METFORMIN HCL ER 500 MG PO TB24
ORAL_TABLET | ORAL | 1 refills | Status: DC
Start: 2023-05-20 — End: 2023-08-23

## 2023-05-24 ENCOUNTER — Ambulatory Visit: Payer: Medicare PPO | Admitting: Clinical

## 2023-05-25 ENCOUNTER — Ambulatory Visit (INDEPENDENT_AMBULATORY_CARE_PROVIDER_SITE_OTHER): Payer: Medicare PPO | Admitting: Podiatry

## 2023-05-25 ENCOUNTER — Encounter: Payer: Self-pay | Admitting: Podiatry

## 2023-05-25 DIAGNOSIS — M79675 Pain in left toe(s): Secondary | ICD-10-CM | POA: Diagnosis not present

## 2023-05-25 DIAGNOSIS — B351 Tinea unguium: Secondary | ICD-10-CM | POA: Diagnosis not present

## 2023-05-25 DIAGNOSIS — M79674 Pain in right toe(s): Secondary | ICD-10-CM

## 2023-05-25 DIAGNOSIS — E119 Type 2 diabetes mellitus without complications: Secondary | ICD-10-CM | POA: Diagnosis not present

## 2023-05-25 NOTE — Progress Notes (Signed)
  Subjective:  Patient ID: Cathy Smith, female    DOB: [birth year],   MRN: [id number]  Chief Complaint  Patient presents with   Diabetes    Feet exam    57 y.o. female presents for concern of thickened elongated and painful nails that are difficult to trim. Requesting to have them trimmed today. Relates burning and tingling in their feet. Patient is diabetic and last A1c was  Lab Results  Component Value Date   HGBA1C 5.5 05/12/2023   .   PCP:  Ivonne Andrew, NP    . Denies any other pedal complaints. Denies n/v/f/c.   Past Medical History:  Diagnosis Date   Coronary artery disease    History of condyloma acuminatum    History of uterine fibroid    History of vaginal dysplasia    VAIN III--  oncologist-  dr Andrey Farmer   History of vulvar dysplasia    VIN III   gyn-oncologist-  dr Andrey Farmer   HIV infection [facility]) montiored by [facility]-  dr hatcher   Hypertension    Hypertriglyceridemia without hypercholesterolemia    PONV (postoperative nausea and vomiting)    Type 2 diabetes mellitus without complication, without long-term current use of insulin (HCC) 09/02/2022   VIN III (vulvar intraepithelial neoplasia III)    Wears glasses     Objective:  Physical Exam: Vascular: DP/PT pulses 2/4 bilateral. CFT <3 seconds. Absent hair growth on digits. Edema noted to bilateral lower extremities. Xerosis noted bilaterally.  Skin. No lacerations or abrasions bilateral feet. Nails 1-5 bilateral  are thickened discolored and elongated with subungual debris.  Musculoskeletal: MMT 5/5 bilateral lower extremities in DF, PF, Inversion and Eversion. Deceased ROM in DF of ankle joint.  Neurological: Sensation intact to light touch. Protective sensation diminished bilateral.    Assessment:   1. Pain due to onychomycosis of toenails of both feet   2. Type 2 diabetes mellitus without complication, without long-term current use of insulin (HCC)       Plan:  Patient was evaluated  and treated and all questions answered. -Discussed and educated patient on diabetic foot care, especially with  regards to the vascular, neurological and musculoskeletal systems.  -Stressed the importance of good glycemic control and the detriment of not  controlling glucose levels in relation to the foot. -Discussed supportive shoes at all times and checking feet regularly.  -Mechanically debrided all nails 1-5 bilateral using sterile nail nipper and filed with dremel without incident  -Answered all patient questions -Patient to return  in 3 months for at risk foot care -Patient advised to call the office if any problems or questions arise in the meantime.   Louann Sjogren, DPM

## 2023-06-07 ENCOUNTER — Ambulatory Visit: Payer: Medicare PPO | Admitting: Clinical

## 2023-06-17 ENCOUNTER — Other Ambulatory Visit: Payer: Medicare PPO | Admitting: Pharmacist

## 2023-06-17 ENCOUNTER — Telehealth: Payer: Self-pay | Admitting: Pharmacist

## 2023-06-17 NOTE — Progress Notes (Signed)
Attempted to contact patient for scheduled appointment for medication management. Left HIPAA compliant message for patient to return my call at their convenience.   Will send MyChart

## 2023-06-21 ENCOUNTER — Encounter: Payer: Self-pay | Admitting: Nurse Practitioner

## 2023-06-21 ENCOUNTER — Ambulatory Visit (INDEPENDENT_AMBULATORY_CARE_PROVIDER_SITE_OTHER): Payer: Medicare PPO | Admitting: Nurse Practitioner

## 2023-06-21 VITALS — BP 109/74 | HR 71 | Temp 97.0°F | Wt 140.4 lb

## 2023-06-21 DIAGNOSIS — H6123 Impacted cerumen, bilateral: Secondary | ICD-10-CM | POA: Diagnosis not present

## 2023-06-21 NOTE — Progress Notes (Signed)
@Patient  ID: Cathy Smith, female    DOB: 1966-07-28, 57 y.o.   MRN: [id number]  Chief Complaint  Patient presents with   Ear Fullness    right    Referring provider: Ivonne Andrew, NP   HPI  Patient presents today for an acute visit.  She states that she is having ear fullness in both the ears.  Upon exam ears are impacted with cerumen.  We will perform ear wash in office today. Denies f/c/s, n/v/d, hemoptysis, PND, leg swelling Denies chest pain or edema       No Known Allergies  Immunization History  Administered Date(s) Administered   COVID-19, mRNA, vaccine(Comirnaty)12 years and older 08/26/2022   H1N1 10/18/2008   Hepatitis B 01/13/2001, 02/02/2001, 03/02/2001, 11/16/2001   Hepatitis B, ADULT 02/05/2014, 08/08/2014, 02/11/2015   Influenza Split 09/02/2011, 12/20/2012   Influenza Whole 08/30/2006, 08/03/2007, 09/24/2008, 08/07/2009, 09/11/2010   Influenza,inj,Quad PF,6+ Mos 08/02/2013, 08/08/2014, 08/27/2015, 08/19/2016, 09/20/2017, 08/15/2018, 08/16/2019, 08/12/2020, 08/04/2021, 08/26/2022   Meningococcal Mcv4o 04/21/2017   PFIZER(Purple Top)SARS-COV-2 Vaccination 01/25/2020, 02/19/2020, 08/30/2020   PNEUMOCOCCAL CONJUGATE-20 04/07/2022   Pfizer Covid-19 Vaccine Bivalent Booster 70yrs & up 08/04/2021   Pneumococcal Conjugate-13 01/13/2016   Pneumococcal Polysaccharide-23 08/30/2006, 12/31/2010   Tdap 09/06/2014    Past Medical History:  Diagnosis Date   Coronary artery disease    History of condyloma acuminatum    History of uterine fibroid    History of vaginal dysplasia    VAIN III--  oncologist-  dr Andrey Farmer   History of vulvar dysplasia    VIN III   gyn-oncologist-  dr Andrey Farmer   HIV infection [facility]-  dr hatcher   Hypertension    Hypertriglyceridemia without hypercholesterolemia    PONV (postoperative nausea and vomiting)    Type 2 diabetes mellitus without complication, without long-term current use of insulin  (HCC) 09/02/2022   VIN III (vulvar intraepithelial neoplasia III)    Wears glasses     Tobacco History: Social History   Tobacco Use  Smoking Status Never  Smokeless Tobacco Never   Counseling given: Not Answered   Outpatient Encounter Medications as of 06/21/2023  Medication Sig   Blood Glucose Monitoring Suppl DEVI 1 each by Does not apply route in the morning, at noon, and at bedtime. May substitute to any manufacturer covered by patient's insurance.   darunavir (PREZISTA) 800 MG tablet Take 1 tablet (800 mg total) by mouth daily with breakfast.   elvitegravir-cobicistat-emtricitabine-tenofovir (GENVOYA) 150-150-200-10 MG TABS tablet Take 1 tablet by mouth daily with breakfast.   furosemide (LASIX) 40 MG tablet Take one tablet by mouth daily as needed for swelling   hydrOXYzine (ATARAX/VISTARIL) 10 MG tablet Take 1 tablet (10 mg total) by mouth 3 (three) times daily as needed.   meclizine (ANTIVERT) 25 MG tablet Take 1 tablet (25 mg total) by mouth 3 (three) times daily as needed for dizziness.   metFORMIN (GLUCOPHAGE-XR) 500 MG 24 hr tablet TAKE 1 TABLET(500 MG) BY MOUTH DAILY WITH BREAKFAST   metoprolol tartrate (LOPRESSOR) 25 MG tablet Take 1.5 tablets (37.5 mg total) by mouth 2 (two) times daily.   rosuvastatin (CRESTOR) 10 MG tablet Take 1 tablet (10 mg total) by mouth daily.   [DISCONTINUED] atorvastatin (LIPITOR) 20 MG tablet TAKE 1 TABLET(20 MG) BY MOUTH DAILY AT 6 PM   No facility-administered encounter medications on file as of 06/21/2023.     Review of Systems  Review of Systems  Constitutional: Negative.   HENT:  Positive for ear pain.   Cardiovascular: Negative.   Gastrointestinal: Negative.   Allergic/Immunologic: Negative.   Neurological: Negative.   Psychiatric/Behavioral: Negative.         Physical Exam  BP 109/74   Pulse 71   Temp (!) 97 F (36.1 C)   Wt 140 lb 6.4 oz (63.7 kg)   SpO2 100%   BMI 26.10 kg/m   Wt Readings from Last 5  Encounters:  06/21/23 140 lb 6.4 oz (63.7 kg)  05/12/23 138 lb (62.6 kg)  05/06/23 133 lb 12.8 oz (60.7 kg)  04/22/23 130 lb (59 kg)  03/22/23 135 lb (61.2 kg)     Physical Exam Vitals and nursing note reviewed.  Constitutional:      General: She is not in acute distress.    Appearance: She is well-developed.  HENT:     Right Ear: There is impacted cerumen.     Left Ear: There is impacted cerumen.  Cardiovascular:     Rate and Rhythm: Normal rate and regular rhythm.  Pulmonary:     Effort: Pulmonary effort is normal.     Breath sounds: Normal breath sounds.  Neurological:     Mental Status: She is alert and oriented to person, place, and time.      Lab Results:  CBC    Component Value Date/Time   WBC 4.1 05/12/2023 1149   WBC 3.7 (L) 08/04/2021 1013   RBC 3.81 05/12/2023 1149   RBC 3.88 08/04/2021 1013   HGB 12.3 05/12/2023 1149   HCT 36.8 05/12/2023 1149   PLT 179 05/12/2023 1149   MCV 97 05/12/2023 1149   MCH 32.3 05/12/2023 1149   MCH 32.5 08/04/2021 1013   MCHC 33.4 05/12/2023 1149   MCHC 33.3 08/04/2021 1013   RDW 11.5 (L) 05/12/2023 1149   LYMPHSABS 1.7 09/02/2022 1129   MONOABS 408 04/13/2017 1122   EOSABS 0.1 09/02/2022 1129   BASOSABS 0.0 09/02/2022 1129    BMET    Component Value Date/Time   NA 142 05/12/2023 1149   K 4.3 05/12/2023 1149   CL 104 05/12/2023 1149   CO2 24 05/12/2023 1149   GLUCOSE 99 05/12/2023 1149   GLUCOSE 130 (H) 08/04/2021 1013   BUN 11 05/12/2023 1149   CREATININE 0.89 05/12/2023 1149   CREATININE 1.10 (H) 08/04/2021 1013   CALCIUM 9.7 05/12/2023 1149   GFRNONAA 65 11/15/2020 1403   GFRNONAA 68 08/01/2019 0908   GFRAA 76 11/15/2020 1403   GFRAA 79 08/01/2019 0908    BNP No results found for: "BNP"  ProBNP No results found for: "PROBNP"  Imaging: No results found.   Assessment & Plan:   Bilateral impacted cerumen - Ear Lavage - Ear Lavage  Follow up:  Follow up as scheduled  Patient Instructions   1. Bilateral impacted cerumen  - Ear Lavage - Ear Lavage  Follow up:  Follow up as scheduled  Earwax Buildup, Adult Your ears make something called earwax. It helps keep germs called bacteria away and protects the skin in your ears. Sometimes, too much earwax can build up. This can cause discomfort or make it harder to hear. What are the causes? Earwax buildup can happen when you have too much earwax in your ears. Earwax is made in the outer part of your ear canal. It's supposed to fall out in small amounts over time. But if your ears aren't able to clean themselves like they should, earwax can build up. What increases the risk? Dineen Kid  more likely to get earwax buildup if: You clean your ears with cotton swabs. You pick at your ears. You use earplugs or in-ear headphones a lot. You wear hearing aids. You may also be more likely to get it if: You're female. You're older. Your ears naturally make more earwax. You have narrow ear canals or extra hair in your ears. Your earwax is too thick or sticky. You have eczema. You're dehydrated. This means there's not enough fluid in your body. What are the signs or symptoms? Symptoms of earwax buildup include: Not being able to hear as well. A feeling of fullness in your ear. Feeling like your ear is plugged. Fluid coming from your ear. Ear pain or an itchy ear. Ringing in your ear. Coughing or problems with balance. How is this diagnosed? Earwax buildup may be diagnosed based on your symptoms, medical history, and an ear exam. During the exam, your health care provider will look into your ear with a tool called an otoscope. You may also have tests, such as a hearing test. How is this treated? Earwax buildup may be treated by: Using ear drops. Having the earwax removed by a provider. The provider may: Flush the ear with water. Use a tool called a curette that has a loop on the end. Use a suction device. Having surgery. This may be  done in severe cases. Follow these instructions at home:  Cleaning your ears Clean your ears as told by your provider. You can clean the outside of your ears with a washcloth or tissue. Do not overclean your ears. Do not put anything into your ear unless told. This includes cotton swabs. General instructions Take over-the-counter and prescription medicines only as told by your provider. Drink enough fluid to keep your pee (urine) pale yellow. This helps thin the earwax. If you have hearing aids, clean them as told. Keep all follow-up visits. If earwax builds up in your ears often or if you use hearing aids, ask your provider how often you should have your ears cleaned. Contact a health care provider if: Your ear pain gets worse. You have a fever. You have pus, blood, or other fluid coming from your ear. You have hearing loss. You have ringing in your ears that won't go away. You feel like the room is spinning. This is called vertigo. Your symptoms don't get better with treatment. This information is not intended to replace advice given to you by your health care provider. Make sure you discuss any questions you have with your health care provider. Document Revised: 01/07/2023 Document Reviewed: 01/07/2023 Elsevier Patient Education  [address]      Ivonne Andrew, [place] 06/21/2023

## 2023-06-21 NOTE — Assessment & Plan Note (Signed)
-   Ear Lavage - Ear Lavage  Follow up:  Follow up as scheduled

## 2023-06-21 NOTE — Patient Instructions (Signed)
1. Bilateral impacted cerumen  - Ear Lavage - Ear Lavage  Follow up:  Follow up as scheduled  Earwax Buildup, Adult Your ears make something called earwax. It helps keep germs called bacteria away and protects the skin in your ears. Sometimes, too much earwax can build up. This can cause discomfort or make it harder to hear. What are the causes? Earwax buildup can happen when you have too much earwax in your ears. Earwax is made in the outer part of your ear canal. It's supposed to fall out in small amounts over time. But if your ears aren't able to clean themselves like they should, earwax can build up. What increases the risk? You're more likely to get earwax buildup if: You clean your ears with cotton swabs. You pick at your ears. You use earplugs or in-ear headphones a lot. You wear hearing aids. You may also be more likely to get it if: You're female. You're older. Your ears naturally make more earwax. You have narrow ear canals or extra hair in your ears. Your earwax is too thick or sticky. You have eczema. You're dehydrated. This means there's not enough fluid in your body. What are the signs or symptoms? Symptoms of earwax buildup include: Not being able to hear as well. A feeling of fullness in your ear. Feeling like your ear is plugged. Fluid coming from your ear. Ear pain or an itchy ear. Ringing in your ear. Coughing or problems with balance. How is this diagnosed? Earwax buildup may be diagnosed based on your symptoms, medical history, and an ear exam. During the exam, your health care provider will look into your ear with a tool called an otoscope. You may also have tests, such as a hearing test. How is this treated? Earwax buildup may be treated by: Using ear drops. Having the earwax removed by a provider. The provider may: Flush the ear with water. Use a tool called a curette that has a loop on the end. Use a suction device. Having surgery. This may be  done in severe cases. Follow these instructions at home:  Cleaning your ears Clean your ears as told by your provider. You can clean the outside of your ears with a washcloth or tissue. Do not overclean your ears. Do not put anything into your ear unless told. This includes cotton swabs. General instructions Take over-the-counter and prescription medicines only as told by your provider. Drink enough fluid to keep your pee (urine) pale yellow. This helps thin the earwax. If you have hearing aids, clean them as told. Keep all follow-up visits. If earwax builds up in your ears often or if you use hearing aids, ask your provider how often you should have your ears cleaned. Contact a health care provider if: Your ear pain gets worse. You have a fever. You have pus, blood, or other fluid coming from your ear. You have hearing loss. You have ringing in your ears that won't go away. You feel like the room is spinning. This is called vertigo. Your symptoms don't get better with treatment. This information is not intended to replace advice given to you by your health care provider. Make sure you discuss any questions you have with your health care provider. Document Revised: 01/07/2023 Document Reviewed: 01/07/2023 Elsevier Patient Education  2024 [employer].

## 2023-06-22 ENCOUNTER — Other Ambulatory Visit: Payer: Self-pay

## 2023-06-22 ENCOUNTER — Encounter: Payer: Self-pay | Admitting: Obstetrics & Gynecology

## 2023-06-22 ENCOUNTER — Inpatient Hospital Stay: Payer: Medicare PPO | Attending: Obstetrics & Gynecology | Admitting: Obstetrics & Gynecology

## 2023-06-22 ENCOUNTER — Inpatient Hospital Stay: Payer: Medicare PPO | Admitting: Obstetrics & Gynecology

## 2023-06-22 ENCOUNTER — Other Ambulatory Visit (HOSPITAL_COMMUNITY)
Admission: RE | Admit: 2023-06-22 | Discharge: 2023-06-22 | Disposition: A | Discharge status: Home / Self Care | Payer: Medicare PPO | Source: Ambulatory Visit | Attending: Obstetrics & Gynecology | Admitting: Obstetrics & Gynecology

## 2023-06-22 VITALS — BP 121/68 | HR 66 | Temp 98.3°F | Resp 18 | Wt 140.2 lb

## 2023-06-22 DIAGNOSIS — Z86002 Personal history of in-situ neoplasm of other and unspecified genital organs: Secondary | ICD-10-CM | POA: Insufficient documentation

## 2023-06-22 DIAGNOSIS — D071 Carcinoma in situ of vulva: Secondary | ICD-10-CM | POA: Insufficient documentation

## 2023-06-22 DIAGNOSIS — Z79899 Other long term (current) drug therapy: Secondary | ICD-10-CM | POA: Diagnosis not present

## 2023-06-22 DIAGNOSIS — Z01419 Encounter for gynecological examination (general) (routine) without abnormal findings: Secondary | ICD-10-CM | POA: Insufficient documentation

## 2023-06-22 DIAGNOSIS — Z1151 Encounter for screening for human papillomavirus (HPV): Secondary | ICD-10-CM | POA: Insufficient documentation

## 2023-06-22 DIAGNOSIS — R87610 Atypical squamous cells of undetermined significance on cytologic smear of cervix (ASC-US): Secondary | ICD-10-CM | POA: Diagnosis not present

## 2023-06-22 DIAGNOSIS — Z21 Asymptomatic human immunodeficiency virus [HIV] infection status: Secondary | ICD-10-CM | POA: Diagnosis not present

## 2023-06-22 DIAGNOSIS — Z9889 Other specified postprocedural states: Secondary | ICD-10-CM | POA: Insufficient documentation

## 2023-06-22 DIAGNOSIS — B977 Papillomavirus as the cause of diseases classified elsewhere: Secondary | ICD-10-CM | POA: Diagnosis not present

## 2023-06-22 NOTE — Assessment & Plan Note (Signed)
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  Immunosuppression w/HIV, Pap smears w/persistent low-grade SIL, hrHPV infection S/P CO2 laser of vulva (circumferential, clitoris) w/right WLDE in 2/20 for VIN 3 and AIN 3 Persistent vulva lesions w/benign histology   >Review the histology from the bx performed at today's visit, Pap testing of the vagina w/HPV-cotesting >Continue to follow VIN and AIN w/regular surveillance and bx of any concerning areas  >Return prn or in 6 mos

## 2023-06-22 NOTE — Progress Notes (Signed)
Follow Up Note: Gyn-Onc  Cathy Smith 57 y.o. female  CC: She presents for a f/u exam  HPI:  57 year old African American female seen in consultation requested of [name], and [name] regarding management of a newly diagnosed severe dysplasia of the upper vagina. Briefly, the patient's history includes having a past history of abdominal hysterectomy for what we understand and have been fibroids. She denies any problems with Pap smears prior to the hysterectomy. More recently she has had some abnormal Pap smears ultimately resulting in colposcopy and directed biopsy on 04/07/2012. 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   The patient did well until March 2015 with a Pap smear showing high-grade dysplasia once again. In April and May 2015 the patient received 2 additional months of Efudex. From June 2015 through February 2016 her Pap smears only showed low-grade SIL.   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 She was scheduled for a wide local excision of the right anterior vulva which was performed on 05/06/15 and revealed VIN3. Margins were positive. Intraoperative evaluation showed lesions consistent with VIN3 on bilateral posterior labia. These were biopsied (but not treated at the patient's request). These biopsies also returned as positive for VIN3.   On 07/23/15 she was taken to the OR for an extensive CO2 laser ablation of the circumferential labia minora and majora to ablate all acetowhite areas. No pathology was taken. The clitoris was spared.    Her VIN 3 recurred and on 02/25/16 she returned to the OR for CO2 laser which included the anterior vulva and clitoris.   Pap in March, 2018 showed LGSIL Pap in March 2019  showed LGSIL , high risk HPV present.    Her last Pap smear in March 2018 showed LGSIL + hrHPV.    She had open heart surgery in 2019. Since that time she has exhibited increased confusion and difficulty following instructions.    She underwent WLE right vulva and CO2 laser of anterior vulva on 05/04/17. She has no complaints. Biopsies on February, 2020 of the anterior and posterior vulva showed VIN 3.   She was taken to the OR on 12/27/18 for a wide local excision of the anterior and posterior vulva and CO2 laser of the periclitoral tissues. 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 All lesions showed VIN 3/AIN 3.   Pap 12/13/18 was stable with LGSIL and positive high risk HPV present.  Pap 5/21 showed ASCUS and positive hight risk HPV present. The histology from a vulva bx in 11/22 was benign. A Pap in 11/22 showed LSIL/HR-HPV pos A Pap in 1/23 showed NILM  Interval History: She denies any new lesions or itching.    Review of Systems  Review of Systems  Genitourinary:        Negative for itching     Current Meds:  Outpatient Encounter Medications as of 06/22/2023  Medication Sig   Blood Glucose Monitoring Suppl DEVI 1 each by Does not apply route in the morning, at noon, and at bedtime. May substitute to any manufacturer covered by patient's insurance.   darunavir (PREZISTA) 800 MG tablet Take 1 tablet (800 mg total) by mouth daily with breakfast.   elvitegravir-cobicistat-emtricitabine-tenofovir (GENVOYA) 150-150-200-10 MG TABS tablet Take 1 tablet by mouth daily with breakfast.  furosemide (LASIX) 40 MG tablet Take one tablet by mouth daily as needed for swelling   hydrOXYzine (ATARAX/VISTARIL) 10 MG tablet Take 1 tablet (10 mg total) by mouth 3 (three) times daily as needed.   meclizine (ANTIVERT) 25 MG tablet Take 1 tablet (25 mg total) by mouth 3 (three) times daily as needed for dizziness.   metFORMIN (GLUCOPHAGE-XR) 500 MG 24  hr tablet TAKE 1 TABLET(500 MG) BY MOUTH DAILY WITH BREAKFAST   metoprolol tartrate (LOPRESSOR) 25 MG tablet Take 1.5 tablets (37.5 mg total) by mouth 2 (two) times daily.   rosuvastatin (CRESTOR) 10 MG tablet Take 1 tablet (10 mg total) by mouth daily.   [DISCONTINUED] atorvastatin (LIPITOR) 20 MG tablet TAKE 1 TABLET(20 MG) BY MOUTH DAILY AT 6 PM   No facility-administered encounter medications on file as of 06/22/2023.    Allergy: No Known Allergies  Social Hx:   Social History   Socioeconomic History   Marital status: Married    Spouse name: Not on file   Number of children: 0   Years of education: 12   Highest education level: Not on file  Occupational History   Not on file  Tobacco Use   Smoking status: Never   Smokeless tobacco: Never  Vaping Use   Vaping status: Never Used  Substance and Sexual Activity   Alcohol use: No    Alcohol/week: 0.0 standard drinks of alcohol   Drug use: No   Sexual activity: Yes    Partners: Male    Birth control/protection: Condom    Comment: accepted condoms  Other Topics Concern   Not on file  Social History Narrative   Right handed   [place] home   Lives with husband   Not employed   Social Determinants of Health   Financial Resource Strain: Low Risk  (01/07/2023)   Overall Financial Resource Strain (CARDIA)    Difficulty of Paying Living Expenses: Not hard at all  Food Insecurity: No Food Insecurity (01/07/2023)   Hunger Vital Sign    Worried About Running Out of Food in the Last Year: Never true    Ran Out of Food in the Last Year: Never true  Transportation Needs: No Transportation Needs (01/07/2023)   PRAPARE - Administrator, Civil Service (Medical): No    Lack of Transportation (Non-Medical): No  Physical Activity: Inactive (01/07/2023)   Exercise Vital Sign    Days of Exercise per Week: 0 days    Minutes of Exercise per Session: 0 min  Stress: No Stress Concern Present (01/07/2023)   [employer] of  Occupational Health - Occupational Stress Questionnaire    Feeling of Stress : Not at all  Social Connections: Socially Integrated (01/07/2023)   Social Connection and Isolation Panel [NHANES]    Frequency of Communication with Friends and Family: More than three times a week    Frequency of Social Gatherings with Friends and Family: More than three times a week    Attends Religious Services: More than 4 times per year    Active Member of [employer] or Organizations: Yes    Attends Banker Meetings: More than 4 times per year    Marital Status: Married  Catering manager Violence: Not At Risk (01/07/2023)   Humiliation, Afraid, Rape, and Kick questionnaire    Fear of Current or Ex-Partner: No    Emotionally Abused: No    Physically Abused: No    Sexually Abused: No    Past Surgical Hx:  Past Surgical History:  Procedure Laterality Date   ABDOMINAL HYSTERECTOMY  1995 approx   ANTERIOR CERVICAL DECOMP/DISCECTOMY FUSION  11-30-2009   C4 -- C6   CO2 LASER APPLICATION Bilateral 07/23/2015   Procedure: BILATERAL CO2 LASER ABLATION OF THE VULVA;  Surgeon: Adolphus Birchwood, MD;  Location: [facility];  Service: Gynecology;  Laterality: Bilateral;   CO2 LASER APPLICATION N/A 02/25/2016   Procedure: CO2 LASER OF THE VULVAR;  Surgeon: Adolphus Birchwood, MD;  Location: [facility]  Service: Gynecology;  Laterality: N/A;   CO2 LASER APPLICATION N/A 05/04/2017   Procedure: CO2 LASER VAPORIZATION OF THE VULVA;  Surgeon: Adolphus Birchwood, MD;  Location: [facility] [place];  Service: Gynecology;  Laterality: N/A;   CO2 LASER APPLICATION N/A 12/27/2018   Procedure: CO2 LASER APPLICATION OF VULVA;  Surgeon: Adolphus Birchwood, MD;  Location: [facility];  Service: Gynecology;  Laterality: N/A;   COLONOSCOPY  01-28-2015   CORONARY ARTERY BYPASS GRAFT N/A 11/25/2017   Procedure: CORONARY ARTERY BYPASS GRAFTING (CABG), ON PUMP, TIMES THREE, USING LEFT INTERNAL MAMMARY ARTERY  AND ENDOSCOPICALLY HARVESTED LEFT GREATER SAPHENOUS VEIN;  Surgeon: Purcell Nails, MD;  Location: [facility] OR;  Service: Open Heart Surgery;  Laterality: N/A;   I & D LEFT BUTTOCK ABSCESS  04-28-2001   LEFT HEART CATH AND CORONARY ANGIOGRAPHY N/A 11/22/2017   Procedure: LEFT HEART CATH AND CORONARY ANGIOGRAPHY;  Surgeon: Runell Gess, MD;  Location: [facility];  Service: Cardiovascular;  Laterality: N/A;   TEE WITHOUT CARDIOVERSION N/A 11/25/2017   Procedure: TRANSESOPHAGEAL ECHOCARDIOGRAM (TEE);  Surgeon: Purcell Nails, MD;  Location: [facility] OR;  Service: Open Heart Surgery;  Laterality: N/A;   VULVECTOMY Right 05/06/2015   Procedure: WIDE LOCAL  EXCISION  OF RIGHT VULVA;  Surgeon: Adolphus Birchwood, MD;  Location: [facility];  Service: Gynecology;  Laterality: Right;   VULVECTOMY N/A 05/04/2017   Procedure: WIDE EXCISION VULVECTOMY;  Surgeon: Adolphus Birchwood, MD;  Location: [facility];  Service: Gynecology;  Laterality: N/A;   VULVECTOMY N/A 12/27/2018   Procedure: WIDE EXCISION VULVECTOMY;  Surgeon: Adolphus Birchwood, MD;  Location: [facility];  Service: Gynecology;  Laterality: N/A;    Past Medical Hx:  Past Medical History:  Diagnosis Date   Coronary artery disease    History of condyloma acuminatum    History of uterine fibroid    History of vaginal dysplasia    VAIN III--  oncologist-  dr Andrey Farmer   History of vulvar dysplasia    VIN III   gyn-oncologist-  dr Andrey Farmer   HIV infection [facility]-  dr hatcher   Hypertension    Hypertriglyceridemia without hypercholesterolemia    PONV (postoperative nausea and vomiting)    Type 2 diabetes mellitus without complication, without long-term current use of insulin (HCC) 09/02/2022   VIN III (vulvar intraepithelial neoplasia III)    Wears glasses     Family Hx:  Family History  Problem Relation Age of Onset   Cataracts Mother    Hypertension Father    CAD Paternal  Grandfather    Colon cancer Neg Hx     Vitals:  BP 121/68 (BP Location: Left Arm, Patient Position: Sitting)   Pulse 66   Temp 98.3 F (36.8 C) (Oral)   Resp 18   Wt 140 lb 3.2 oz (63.6 kg)   SpO2 100%   BMI 26.06 kg/m    Physical Exam:  Physical Exam HENT:     Head: Normocephalic and atraumatic.  Abdominal:     Palpations: There is no mass.     Tenderness: There is no abdominal tenderness.     Hernia: No hernia is present.  Genitourinary:    Vagina: Normal. No lesions.     Rectum: Normal.       Comments: Hyperpigmented, irregular pigment, white, verrucous plaques Musculoskeletal:     Cervical back: Neck supple.     Right lower leg: No edema.     Left lower leg: No edema.  Lymphadenopathy:     Cervical:     Right cervical: No superficial cervical adenopathy.    Left cervical: No superficial cervical adenopathy.     Lower Body: No right inguinal adenopathy. No left inguinal adenopathy.     Punch Biopsy Procedure Note  Pre-operative Diagnosis: Suspicious lesion  Post-operative Diagnosis: same  Locations: left   vulva  Indications: H/O VIN III  Anesthesia: Lidocaine 2% with epinephrine   Procedure Details  History of allergy to iodine: no Patient informed of the risks (including bleeding and infection) and benefits of the  procedure and Verbal informed consent obtained.  The lesion and surrounding area was given a sterile prep using betadyne. The skin was then stretched perpendicular to the skin tension lines and the lesion removed using the 3 mm punch. Silver nitrate was applied to the resulting ellipse. The specimen was sent for pathologic examination. The patient tolerated the procedure well.  EBL: minimal  Findings: See above  Condition: Stable  Complications: none.  Plan: 1. Instructed to keep the wound clean. 2. Warning signs of infection were reviewed.   3. Recommended that the patient use OTC analgesics as needed for pain.    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  Immunosuppression w/HIV, Pap smears w/persistent low-grade SIL, hrHPV infection S/P CO2 laser of vulva (circumferential, clitoris) w/right WLDE in 2/20 for VIN 3 and AIN 3 Persistent vulva lesions w/benign histology   >Review the histology from the bx performed at today's visit, Pap testing of the vagina w/HPV-cotesting >Continue to follow VIN and AIN w/regular surveillance and bx of any concerning areas  >Return prn or in 6 mos     I personally spent 30 minutes face-to-face and non-face-to-face in the care of this patient, which includes all pre, intra, and post visit time on the date of service.    Antionette Char, MD 06/22/2023, 2:21 PM

## 2023-06-22 NOTE — Patient Instructions (Addendum)
Return as needed or in 6 months. Please call at the end of December or beginning of January to get scheduled fro February.  Our office will call you with the pap smear results and biopsy results obtained today.

## 2023-06-24 ENCOUNTER — Telehealth: Payer: Self-pay | Admitting: *Deleted

## 2023-06-24 NOTE — Telephone Encounter (Signed)
Spoke with Ms. Coon who phoned the office to see if her pap and biopsy results were complete. Pt informed they are still in process and once we have the results a provider will be reaching out. Pt had no further concerns or questions at this time.

## 2023-06-29 ENCOUNTER — Telehealth: Payer: Self-pay | Admitting: *Deleted

## 2023-06-29 NOTE — Telephone Encounter (Signed)
Attempted to reach Ms. Coffey to relay message from Warner Mccreedy, NP in regards to patients pap smear results.  Left voicemail requesting call back.

## 2023-06-29 NOTE — Telephone Encounter (Signed)
-----   Message from Doylene Bode sent at 06/29/2023 12:59 PM EDT ----- Please call her and let her know her pap smear is showing atypical cells with HPV high risk not detected. Dr. Tina Griffiths wants her to come in for colposcopy to look closer in the vaginal with the colposcope which magnifies things.   Her vulvar biopsy showed NO precancer or cancer. Good news. We will monitor this area when she comes for her exams.

## 2023-06-29 NOTE — Telephone Encounter (Signed)
Spoke with Patient who called the office back. Relayed message from Warner Mccreedy, NP that pt's pap smear is showing atypical cells with HPV high risk not detected. Dr. Tamela Oddi would like for you to come in for colposcopy to look closer in the vaginal area, this will magnify the area for better viewing. Also, your vulvar biopsy showed no precancer or cancer. "Good News" we will monitor this area at your visits with exam. Pt verbalized understanding and appt. Made for Wednesday, September 25 th. At 0930. Pt agreed to date and time.

## 2023-07-28 ENCOUNTER — Telehealth: Payer: Self-pay | Admitting: *Deleted

## 2023-07-28 NOTE — Telephone Encounter (Signed)
Spoke with Cathy Smith this morning who called the office to clarify her pap results and our last conversation on August 20 th. Reiterate information and reminded patient of her appt. Next Wednesday, September 25 th at 0930 with Dr. Tamela Oddi. Pt verbalized understanding.

## 2023-07-29 ENCOUNTER — Telehealth: Payer: Self-pay

## 2023-07-29 NOTE — Telephone Encounter (Signed)
Pt called asking about her blood pressure readings being low.She was called back and asked some questions about how she is feeling. Pt advised she is not having any dizziness or lightheaded feeling.  Pt report pressure being in the 90's over 60's. Pt was advised if she feels lightheaded or has dizziness to make sure she is seen asap by [place] or the ER.  Just FYI. KH

## 2023-08-04 ENCOUNTER — Inpatient Hospital Stay: Payer: Medicare PPO | Attending: Obstetrics & Gynecology | Admitting: Obstetrics & Gynecology

## 2023-08-04 DIAGNOSIS — B977 Papillomavirus as the cause of diseases classified elsewhere: Secondary | ICD-10-CM

## 2023-08-04 NOTE — Progress Notes (Deleted)
Follow Up Note: Gyn-Onc  [name] 57 y.o. female  CC: She presents for colposcopy  HPI:  57 year old African American female seen in consultation requested of [name], and [name] regarding management of a newly diagnosed severe dysplasia of the upper vagina. Briefly, the patient's history includes having a past history of abdominal hysterectomy for what we understand and have been fibroids. She denies any problems with Pap smears prior to the hysterectomy. More recently she has had some abnormal Pap smears ultimately resulting in colposcopy and directed biopsy on 04/07/2012. 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   The patient did well until March 2015 with a Pap smear showing high-grade dysplasia once again. In April and May 2015 the patient received 2 additional months of Efudex. From June 2015 through February 2016 her Pap smears only showed low-grade SIL.   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 She was scheduled for a wide local excision of the right anterior vulva which was performed on 05/06/15 and revealed VIN3. Margins were positive. Intraoperative evaluation showed lesions consistent with VIN3 on bilateral posterior labia. These were biopsied (but not treated at the patient's request). These biopsies also returned as positive for VIN3.   On 07/23/15 she was taken to the OR for an extensive CO2 laser ablation of the circumferential labia minora and majora to ablate all acetowhite areas. No pathology was taken. The clitoris was spared.    Her VIN 3 recurred and on 02/25/16 she returned to the OR for CO2 laser which included the anterior vulva and clitoris.   Pap in March, 2018 showed LGSIL Pap in March 2019  showed LGSIL , high risk HPV present.    Her last Pap smear in March 2018 showed LGSIL + hrHPV.    She had open heart surgery in 2019. Since that time she has exhibited increased confusion and difficulty following instructions.    She underwent WLE right vulva and CO2 laser of anterior vulva on 05/04/17. She has no complaints. Biopsies on February, 2020 of the anterior and posterior vulva showed VIN 3.   She was taken to the OR on 12/27/18 for a wide local excision of the anterior and posterior vulva and CO2 laser of the periclitoral tissues. 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 All lesions showed VIN 3/AIN 3.   Pap 12/13/18 was stable with LGSIL and positive high risk HPV present.  Pap 5/21 showed ASCUS and positive hight risk HPV present. The histology from a vulva bx in 11/22 was benign. A Pap in 11/22 showed LSIL/HR-HPV pos A Pap in 1/23 showed NILM A vulva bx in 8/24 was neg for dysplasia---ddx included lichen simplex chronicus.  A Pap showed ASCUSHR-HPV neg  Interval History: She denies any new lesions or itching.    Review of Systems  Review of Systems  Genitourinary:        Negative for itching     Current Meds:  Outpatient Encounter Medications as of 08/04/2023  Medication Sig   Blood Glucose Monitoring Suppl DEVI 1 each by Does not apply route in the morning, at noon, and at bedtime. May substitute to any manufacturer covered by patient's insurance.   darunavir (PREZISTA) 800 MG tablet Take 1 tablet (800 mg total) by mouth daily with breakfast.  elvitegravir-cobicistat-emtricitabine-tenofovir (GENVOYA) 150-150-200-10 MG TABS tablet Take 1 tablet by mouth daily with breakfast.   furosemide (LASIX) 40 MG tablet Take one tablet by mouth daily as needed for swelling   hydrOXYzine (ATARAX/VISTARIL) 10 MG tablet Take 1 tablet (10 mg total) by mouth 3 (three) times daily as needed.   meclizine (ANTIVERT) 25 MG tablet Take 1  tablet (25 mg total) by mouth 3 (three) times daily as needed for dizziness.   metFORMIN (GLUCOPHAGE-XR) 500 MG 24 hr tablet TAKE 1 TABLET(500 MG) BY MOUTH DAILY WITH BREAKFAST   rosuvastatin (CRESTOR) 10 MG tablet Take 1 tablet (10 mg total) by mouth daily.   metoprolol tartrate (LOPRESSOR) 25 MG tablet Take 1.5 tablets (37.5 mg total) by mouth 2 (two) times daily.   [DISCONTINUED] atorvastatin (LIPITOR) 20 MG tablet TAKE 1 TABLET(20 MG) BY MOUTH DAILY AT 6 PM   No facility-administered encounter medications on file as of 08/04/2023.    Allergy: No Known Allergies  Social Hx:   Social History   Socioeconomic History   Marital status: Married    Spouse name: Not on file   Number of children: 0   Years of education: 12   Highest education level: Not on file  Occupational History   Not on file  Tobacco Use   Smoking status: Never   Smokeless tobacco: Never  Vaping Use   Vaping status: Never Used  Substance and Sexual Activity   Alcohol use: No    Alcohol/week: 0.0 standard drinks of alcohol   Drug use: No   Sexual activity: Yes    Partners: Male    Birth control/protection: Condom    Comment: accepted condoms  Other Topics Concern   Not on file  Social History Narrative   Right handed   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   Lives with husband   Not employed   Social Determinants of Health   Financial Resource Strain: Low Risk  (01/07/2023)   Overall Financial Resource Strain (CARDIA)    Difficulty of Paying Living Expenses: Not hard at all  Food Insecurity: No Food Insecurity (01/07/2023)   Hunger Vital Sign    Worried About Running Out of Food in the Last Year: Never true    Ran Out of Food in the Last Year: Never true  Transportation Needs: No Transportation Needs (01/07/2023)   PRAPARE - Administrator, Civil Service (Medical): No    Lack of Transportation (Non-Medical): No  Physical Activity: Inactive (01/07/2023)   Exercise Vital Sign    Days of Exercise per Week: 0 days     Minutes of Exercise per Session: 0 min  Stress: No Stress Concern Present (01/07/2023)   [employer] of Occupational Health - Occupational Stress Questionnaire    Feeling of Stress : Not at all  Social Connections: Socially Integrated (01/07/2023)   Social Connection and Isolation Panel [NHANES]    Frequency of Communication with Friends and Family: More than three times a week    Frequency of Social Gatherings with Friends and Family: More than three times a week    Attends Religious Services: More than 4 times per year    Active Member of [employer] or Organizations: Yes    Attends Banker Meetings: More than 4 times per year    Marital Status: Married  Catering manager Violence: Not At Risk (01/07/2023)   Humiliation, Afraid, Rape, and Kick questionnaire    Fear of Current or Ex-Partner: No    Emotionally Abused: No  Physically Abused: No    Sexually Abused: No    Past Surgical Hx:  Past Surgical History:  Procedure Laterality Date   ABDOMINAL HYSTERECTOMY  1995 approx   ANTERIOR CERVICAL DECOMP/DISCECTOMY FUSION  11-30-2009   C4 -- C6   CO2 LASER APPLICATION Bilateral 07/23/2015   Procedure: BILATERAL CO2 LASER ABLATION OF THE VULVA;  Surgeon: Adolphus Birchwood, MD;  Location: [facility] [place];  Service: Gynecology;  Laterality: Bilateral;   CO2 LASER APPLICATION N/A 02/25/2016   Procedure: CO2 LASER OF THE VULVAR;  Surgeon: Adolphus Birchwood, MD;  Location: [facility];  Service: Gynecology;  Laterality: N/A;   CO2 LASER APPLICATION N/A 05/04/2017   Procedure: CO2 LASER VAPORIZATION OF THE VULVA;  Surgeon: Adolphus Birchwood, MD;  Location: [facility];  Service: Gynecology;  Laterality: N/A;   CO2 LASER APPLICATION N/A 12/27/2018   Procedure: CO2 LASER APPLICATION OF VULVA;  Surgeon: Adolphus Birchwood, MD;  Location: [facility];  Service: Gynecology;  Laterality: N/A;   COLONOSCOPY  01-28-2015   CORONARY ARTERY BYPASS GRAFT N/A 11/25/2017    Procedure: CORONARY ARTERY BYPASS GRAFTING (CABG), ON PUMP, TIMES THREE, USING LEFT INTERNAL MAMMARY ARTERY AND ENDOSCOPICALLY HARVESTED LEFT GREATER SAPHENOUS VEIN;  Surgeon: Purcell Nails, MD;  Location: [facility] OR;  Service: Open Heart Surgery;  Laterality: N/A;   I & D LEFT BUTTOCK ABSCESS  04-28-2001   LEFT HEART CATH AND CORONARY ANGIOGRAPHY N/A 11/22/2017   Procedure: LEFT HEART CATH AND CORONARY ANGIOGRAPHY;  Surgeon: Runell Gess, MD;  Location: [facility];  Service: Cardiovascular;  Laterality: N/A;   TEE WITHOUT CARDIOVERSION N/A 11/25/2017   Procedure: TRANSESOPHAGEAL ECHOCARDIOGRAM (TEE);  Surgeon: Purcell Nails, MD;  Location: [facility] OR;  Service: Open Heart Surgery;  Laterality: N/A;   VULVECTOMY Right 05/06/2015   Procedure: WIDE LOCAL  EXCISION  OF RIGHT VULVA;  Surgeon: Adolphus Birchwood, MD;  Location: [facility] [place];  Service: Gynecology;  Laterality: Right;   VULVECTOMY N/A 05/04/2017   Procedure: WIDE EXCISION VULVECTOMY;  Surgeon: Adolphus Birchwood, MD;  Location: [facility];  Service: Gynecology;  Laterality: N/A;   VULVECTOMY N/A 12/27/2018   Procedure: WIDE EXCISION VULVECTOMY;  Surgeon: Adolphus Birchwood, MD;  Location: [facility];  Service: Gynecology;  Laterality: N/A;    Past Medical Hx:  Past Medical History:  Diagnosis Date   Coronary artery disease    History of condyloma acuminatum    History of uterine fibroid    History of vaginal dysplasia    VAIN III--  oncologist-  dr Andrey Farmer   History of vulvar dysplasia    VIN III   gyn-oncologist-  dr Andrey Farmer   HIV infection [facility]) [facility]  dr hatcher   Hypertension    Hypertriglyceridemia without hypercholesterolemia    PONV (postoperative nausea and vomiting)    Type 2 diabetes mellitus without complication, without long-term current use of insulin (HCC) 09/02/2022   VIN III (vulvar intraepithelial neoplasia III)    Wears glasses     Family Hx:   Family History  Problem Relation Age of Onset   Cataracts Mother    Hypertension Father    CAD Paternal Grandfather    Colon cancer Neg Hx     Vitals:  There were no vitals taken for this visit.   Physical Exam:  Physical Exam HENT:     Head: Normocephalic and atraumatic.  Abdominal:     Palpations: There is  no mass.     Tenderness: There is no abdominal tenderness.     Hernia: No hernia is present.  Genitourinary:    Vagina: Normal. No lesions.     Rectum: Normal.       Comments: Hyperpigmented, irregular pigment, white, verrucous plaques Musculoskeletal:     Cervical back: Neck supple.     Right lower leg: No edema.     Left lower leg: No edema.  Lymphadenopathy:     Cervical:     Right cervical: No superficial cervical adenopathy.    Left cervical: No superficial cervical adenopathy.     Lower Body: No right inguinal adenopathy. No left inguinal adenopathy.     Punch Biopsy Procedure Note  Pre-operative Diagnosis: Suspicious lesion  Post-operative Diagnosis: same  Locations: left   vulva  Indications: H/O VIN III  Anesthesia: Lidocaine 2% with epinephrine   Procedure Details  History of allergy to iodine: no Patient informed of the risks (including bleeding and infection) and benefits of the  procedure and Verbal informed consent obtained.  The lesion and surrounding area was given a sterile prep using betadyne. The skin was then stretched perpendicular to the skin tension lines and the lesion removed using the 3 mm punch. Silver nitrate was applied to the resulting ellipse. The specimen was sent for pathologic examination. The patient tolerated the procedure well.  EBL: minimal  Findings: See above  Condition: Stable  Complications: none.  Plan: 1. Instructed to keep the wound clean. 2. Warning signs of infection were reviewed.   3. Recommended that the patient use OTC analgesics as needed for pain.   Assessment/Plan:   VIN III (vulvar  intraepithelial neoplasia III) Recurrent VAIN 3 and VIN 3.  Immunosuppression w/HIV, Pap smears w/persistent low-grade SIL, hrHPV infection S/P CO2 laser of vulva (circumferential, clitoris) w/right WLDE in 2/20 for VIN 3 and AIN 3 Persistent vulva lesions w/benign histology   >Review the histology from the bx performed at today's visit, Pap testing of the vagina w/HPV-cotesting >Continue to follow VIN and AIN w/regular surveillance and bx of any concerning areas  >Return prn or in 6 mos     I personally spent 30 minutes face-to-face and non-face-to-face in the care of this patient, which includes all pre, intra, and post visit time on the date of service.    Antionette Char, MD 08/04/2023, 9:26 AM

## 2023-08-10 ENCOUNTER — Telehealth: Payer: Self-pay | Admitting: *Deleted

## 2023-08-10 NOTE — Telephone Encounter (Signed)
Spoke with Ms. Haws who called the office stating she missed her appt. With Dr. Tamela Oddi on Wednesday Sept. 25 th. And she is calling to reschedule. Cathy Smith was given an appointment on Wednesday, October 9th. At 3:15 with check in time at 3:00 pm. Cathy Smith agreed to date and time.

## 2023-08-12 ENCOUNTER — Encounter: Payer: Self-pay | Admitting: Nurse Practitioner

## 2023-08-12 ENCOUNTER — Ambulatory Visit (INDEPENDENT_AMBULATORY_CARE_PROVIDER_SITE_OTHER): Payer: Medicare PPO | Admitting: Nurse Practitioner

## 2023-08-12 ENCOUNTER — Ambulatory Visit (HOSPITAL_COMMUNITY)
Admission: RE | Admit: 2023-08-12 | Discharge: 2023-08-12 | Disposition: A | Discharge status: Home / Self Care | Payer: Medicare PPO | Source: Ambulatory Visit | Attending: Nurse Practitioner | Admitting: Nurse Practitioner

## 2023-08-12 VITALS — BP 94/66 | HR 67 | Temp 97.2°F | Wt 136.8 lb

## 2023-08-12 DIAGNOSIS — Z23 Encounter for immunization: Secondary | ICD-10-CM | POA: Diagnosis not present

## 2023-08-12 DIAGNOSIS — M79661 Pain in right lower leg: Secondary | ICD-10-CM

## 2023-08-12 DIAGNOSIS — E119 Type 2 diabetes mellitus without complications: Secondary | ICD-10-CM | POA: Diagnosis not present

## 2023-08-12 LAB — POCT GLYCOSYLATED HEMOGLOBIN (HGB A1C): Hemoglobin A1C: 5.5 % (ref 4.0–5.6)

## 2023-08-12 NOTE — Progress Notes (Signed)
Subjective   Patient ID: Cathy Smith, female    DOB: [birth year], 57 y.o.   MRN: [id number]  Chief Complaint  Patient presents with  . Diabetes    Referring provider: Ivonne Andrew, NP  Cathy Smith is a 57 y.o. female with Past Medical History: No date: Coronary artery disease No date: History of condyloma acuminatum No date: History of uterine fibroid No date: History of vaginal dysplasia     Comment:  VAIN III--  oncologist-  dr Andrey Farmer No date: History of vulvar dysplasia     Comment:  VIN III   gyn-oncologist-  dr Andrey Farmer [facility] hatcher: HIV infection  [facility]) No date: Hypertension No date: Hypertriglyceridemia without hypercholesterolemia No date: PONV (postoperative nausea and vomiting) 09/02/2022: Type 2 diabetes mellitus without complication, without  long-term current use of insulin (HCC) No date: VIN III (vulvar intraepithelial neoplasia III) No date: Wears glasses  HPI:  Hypertension: Patient here for follow-up of elevated blood pressure. She is not exercising and is adherent to low salt diet.  Blood pressure is well controlled at home. Cardiac symptoms none. Patient denies none.  Cardiovascular risk factors: hypertension, obesity (BMI >= 30 kg/m2), and sedentary lifestyle. Use of agents associated with hypertension: none. History of target organ damage: none. States that she regularly takes B/P at home and it is similar to today's values. Currently compliant with all medications.   Diabetes: Presents for follow up on diabetes. Currently on metformin for diabetes - working on diabetic diet. Patient has been to nutritionist, but states that she is still eating unhealthy. Did discuss healthy food choices today. A1c in office today was 5.6. Denies f/c/s, n/v/d, hemoptysis, PND, leg swelling Denies chest pain or edema   Patient does complain today of pain to right calf region.  This has been present for a few days.    Denies f/c/s,  n/v/d, hemoptysis, PND, leg swelling Denies chest pain or edema    No Known Allergies  Immunization History  Administered Date(s) Administered  . H1N1 10/18/2008  . Hepatitis B 01/13/2001, 02/02/2001, 03/02/2001, 11/16/2001  . Hepatitis B, ADULT 02/05/2014, 08/08/2014, 02/11/2015  . Influenza Split 09/02/2011, 12/20/2012  . Influenza Whole 08/30/2006, 08/03/2007, 09/24/2008, 08/07/2009, 09/11/2010  . Influenza,inj,Quad PF,6+ Mos 08/02/2013, 08/08/2014, 08/27/2015, 08/19/2016, 09/20/2017, 08/15/2018, 08/16/2019, 08/12/2020, 08/04/2021, 08/26/2022  . Meningococcal Mcv4o 04/21/2017  . PFIZER(Purple Top)SARS-COV-2 Vaccination 01/25/2020, 02/19/2020, 08/30/2020  . PNEUMOCOCCAL CONJUGATE-20 04/07/2022  . Research officer, trade union 25yrs & up 08/04/2021  . Pfizer(Comirnaty)Fall Seasonal Vaccine 12 years and older 08/26/2022  . Pneumococcal Conjugate-13 01/13/2016  . Pneumococcal Polysaccharide-23 08/30/2006, 12/31/2010  . Tdap 09/06/2014    Tobacco History: Social History   Tobacco Use  Smoking Status Never  Smokeless Tobacco Never   Counseling given: Not Answered   Outpatient Encounter Medications as of 08/12/2023  Medication Sig  . Blood Glucose Monitoring Suppl DEVI 1 each by Does not apply route in the morning, at noon, and at bedtime. May substitute to any manufacturer covered by patient's insurance.  . darunavir (PREZISTA) 800 MG tablet Take 1 tablet (800 mg total) by mouth daily with breakfast.  . elvitegravir-cobicistat-emtricitabine-tenofovir (GENVOYA) 150-150-200-10 MG TABS tablet Take 1 tablet by mouth daily with breakfast.  . furosemide (LASIX) 40 MG tablet Take one tablet by mouth daily as needed for swelling  . hydrOXYzine (ATARAX/VISTARIL) 10 MG tablet Take 1 tablet (10 mg total) by mouth 3 (three) times daily as needed.  . meclizine (ANTIVERT) 25  MG tablet Take 1 tablet (25 mg total) by mouth 3 (three) times daily as needed for dizziness.  . metFORMIN  (GLUCOPHAGE-XR) 500 MG 24 hr tablet TAKE 1 TABLET(500 MG) BY MOUTH DAILY WITH BREAKFAST  . metoprolol tartrate (LOPRESSOR) 25 MG tablet Take 1.5 tablets (37.5 mg total) by mouth 2 (two) times daily.  . rosuvastatin (CRESTOR) 10 MG tablet Take 1 tablet (10 mg total) by mouth daily.  . [DISCONTINUED] atorvastatin (LIPITOR) 20 MG tablet TAKE 1 TABLET(20 MG) BY MOUTH DAILY AT 6 PM   No facility-administered encounter medications on file as of 08/12/2023.    Review of Systems  Review of Systems  Constitutional: Negative.   HENT: Negative.    Cardiovascular: Negative.   Gastrointestinal: Negative.   Allergic/Immunologic: Negative.   Neurological: Negative.   Psychiatric/Behavioral: Negative.       Objective:   BP 94/66   Pulse 67   Temp (!) 97.2 F (36.2 C)   Wt 136 lb 12.8 oz (62.1 kg)   SpO2 100%   BMI 25.43 kg/m   Wt Readings from Last 5 Encounters:  08/12/23 136 lb 12.8 oz (62.1 kg)  06/22/23 140 lb 3.2 oz (63.6 kg)  06/21/23 140 lb 6.4 oz (63.7 kg)  05/12/23 138 lb (62.6 kg)  05/06/23 133 lb 12.8 oz (60.7 kg)     Physical Exam Vitals and nursing note reviewed.  Constitutional:      General: She is not in acute distress.    Appearance: She is well-developed.  Cardiovascular:     Rate and Rhythm: Normal rate and regular rhythm.  Pulmonary:     Effort: Pulmonary effort is normal.     Breath sounds: Normal breath sounds.  Neurological:     Mental Status: She is alert and oriented to person, place, and time.      Assessment & Plan:   Right calf pain -     VAS [place] LOWER EXTREMITY VENOUS (DVT); Future  Need for influenza vaccination -     Flu vaccine trivalent PF, 6mos and older(Flulaval,Afluria,Fluarix,Fluzone)  Type 2 diabetes mellitus without complication, without long-term current use of insulin (HCC) -     POCT glycosylated hemoglobin (Hb A1C)     Return in about 6 months (around 02/10/2024).   Ivonne Andrew, NP 08/12/2023

## 2023-08-12 NOTE — Patient Instructions (Signed)
1. Right calf pain  - VAS [place] LOWER EXTREMITY VENOUS (DVT); Future  2. Need for influenza vaccination  - Flu vaccine trivalent PF, 6mos and older(Flulaval,Afluria,Fluarix,Fluzone)  3. Type 2 diabetes mellitus without complication, without long-term current use of insulin (HCC)  - POCT glycosylated hemoglobin (Hb A1C)  Follow up:  Follow up in 6 months

## 2023-08-18 ENCOUNTER — Inpatient Hospital Stay: Payer: Medicare PPO | Attending: Obstetrics & Gynecology | Admitting: Obstetrics & Gynecology

## 2023-08-18 ENCOUNTER — Encounter: Payer: Self-pay | Admitting: Obstetrics & Gynecology

## 2023-08-18 VITALS — BP 120/73 | HR 72 | Resp 18 | Ht 61.5 in | Wt 136.0 lb

## 2023-08-18 DIAGNOSIS — Z21 Asymptomatic human immunodeficiency virus [HIV] infection status: Secondary | ICD-10-CM | POA: Diagnosis not present

## 2023-08-18 DIAGNOSIS — Z86002 Personal history of in-situ neoplasm of other and unspecified genital organs: Secondary | ICD-10-CM | POA: Diagnosis not present

## 2023-08-18 DIAGNOSIS — D071 Carcinoma in situ of vulva: Secondary | ICD-10-CM | POA: Diagnosis not present

## 2023-08-18 DIAGNOSIS — Z9889 Other specified postprocedural states: Secondary | ICD-10-CM | POA: Insufficient documentation

## 2023-08-18 NOTE — Progress Notes (Signed)
Follow Up Note: Gyn-Onc  Cathy Smith 57 y.o. female  CC: She presents for a f/u exam  HPI:  57 year old African American female seen in consultation requested of [name], and [name] regarding management of a newly diagnosed severe dysplasia of the upper vagina. Briefly, the patient's history includes having a past history of abdominal hysterectomy for what we understand and have been fibroids. She denies any problems with Pap smears prior to the hysterectomy. More recently she has had some abnormal Pap smears ultimately resulting in colposcopy and directed biopsy on 04/07/2012. 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   The patient did well until March 2015 with a Pap smear showing high-grade dysplasia once again. In April and May 2015 the patient received 2 additional months of Efudex. From June 2015 through February 2016 her Pap smears only showed low-grade SIL.   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 She was scheduled for a wide local excision of the right anterior vulva which was performed on 05/06/15 and revealed VIN3. Margins were positive. Intraoperative evaluation showed lesions consistent with VIN3 on bilateral posterior labia. These were biopsied (but not treated at the patient's request). These biopsies also returned as positive for VIN3.   On 07/23/15 she was taken to the OR for an extensive CO2 laser ablation of the circumferential labia minora and majora to ablate all acetowhite areas. No pathology was taken. The clitoris was spared.    Her VIN 3 recurred and on 02/25/16 she returned to the OR for CO2 laser which included the anterior vulva and clitoris.   Pap in March, 2018 showed LGSIL Pap in March 2019  showed LGSIL , high risk HPV present.    Her last Pap smear in March 2018 showed LGSIL + hrHPV.    She had open heart surgery in 2019. Since that time she has exhibited increased confusion and difficulty following instructions.    She underwent WLE right vulva and CO2 laser of anterior vulva on 05/04/17. She has no complaints. Biopsies on February, 2020 of the anterior and posterior vulva showed VIN 3.   She was taken to the OR on 12/27/18 for a wide local excision of the anterior and posterior vulva and CO2 laser of the periclitoral tissues. 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 All lesions showed VIN 3/AIN 3.   Pap 12/13/18 was stable with LGSIL and positive high risk HPV present.  Pap 5/21 showed ASCUS and positive hight risk HPV present. The histology from a vulva bx in 11/22 was benign. A Pap in 11/22 showed LSIL/HR-HPV pos A Pap in 1/23 showed NILM A Pap in 8/24 showed ASCUS/HR-HPV neg The histology from a left labial bx in 8/24 was benign.  Interval History: She denies any new lesions or itching.    Review of Systems  Review of Systems  Genitourinary:        Negative for itching     Current Meds:  Outpatient Encounter Medications as of 08/18/2023  Medication Sig   Blood Glucose Monitoring Suppl DEVI 1 each by Does not apply route in the morning, at noon, and at bedtime. May substitute to any manufacturer covered by patient's insurance.   darunavir (PREZISTA) 800 MG tablet Take 1 tablet (800 mg total) by mouth daily with  breakfast.   elvitegravir-cobicistat-emtricitabine-tenofovir (GENVOYA) 150-150-200-10 MG TABS tablet Take 1 tablet by mouth daily with breakfast.   furosemide (LASIX) 40 MG tablet Take one tablet by mouth daily as needed for swelling   hydrOXYzine (ATARAX/VISTARIL) 10 MG tablet Take 1 tablet (10 mg total) by mouth 3 (three) times daily as needed.   meclizine (ANTIVERT) 25 MG tablet Take 1 tablet (25 mg total)  by mouth 3 (three) times daily as needed for dizziness.   metFORMIN (GLUCOPHAGE-XR) 500 MG 24 hr tablet TAKE 1 TABLET(500 MG) BY MOUTH DAILY WITH BREAKFAST   metoprolol tartrate (LOPRESSOR) 25 MG tablet Take 1.5 tablets (37.5 mg total) by mouth 2 (two) times daily.   rosuvastatin (CRESTOR) 10 MG tablet Take 1 tablet (10 mg total) by mouth daily.   [DISCONTINUED] atorvastatin (LIPITOR) 20 MG tablet TAKE 1 TABLET(20 MG) BY MOUTH DAILY AT 6 PM   No facility-administered encounter medications on file as of 08/18/2023.    Allergy: No Known Allergies  Social Hx:   Social History   Socioeconomic History   Marital status: Married    Spouse name: Not on file   Number of children: 0   Years of education: 12   Highest education level: Not on file  Occupational History   Not on file  Tobacco Use   Smoking status: Never   Smokeless tobacco: Never  Vaping Use   Vaping status: Never Used  Substance and Sexual Activity   Alcohol use: No    Alcohol/week: 0.0 standard drinks of alcohol   Drug use: No   Sexual activity: Yes    Partners: Male    Birth control/protection: Condom    Comment: accepted condoms  Other Topics Concern   Not on file  Social History Narrative   Right handed   [place] home   Lives with husband   Not employed   Social Determinants of Health   Financial Resource Strain: Low Risk  (01/07/2023)   Overall Financial Resource Strain (CARDIA)    Difficulty of Paying Living Expenses: Not hard at all  Food Insecurity: No Food Insecurity (01/07/2023)   Hunger Vital Sign    Worried About Running Out of Food in the Last Year: Never true    Ran Out of Food in the Last Year: Never true  Transportation Needs: No Transportation Needs (01/07/2023)   PRAPARE - Administrator, Civil Service (Medical): No    Lack of Transportation (Non-Medical): No  Physical Activity: Inactive (01/07/2023)   Exercise Vital Sign    Days of Exercise per Week: 0 days    Minutes of Exercise  per Session: 0 min  Stress: No Stress Concern Present (01/07/2023)   [employer] of Occupational Health - Occupational Stress Questionnaire    Feeling of Stress : Not at all  Social Connections: Socially Integrated (01/07/2023)   Social Connection and Isolation Panel [NHANES]    Frequency of Communication with Friends and Family: More than three times a week    Frequency of Social Gatherings with Friends and Family: More than three times a week    Attends Religious Services: More than 4 times per year    Active Member of [employer] or Organizations: Yes    Attends Banker Meetings: More than 4 times per year    Marital Status: Married  Catering manager Violence: Not At Risk (01/07/2023)   Humiliation, Afraid, Rape, and Kick questionnaire    Fear of Current or Ex-Partner: No    Emotionally Abused: No  Physically Abused: No    Sexually Abused: No    Past Surgical Hx:  Past Surgical History:  Procedure Laterality Date   ABDOMINAL HYSTERECTOMY  1995 approx   ANTERIOR CERVICAL DECOMP/DISCECTOMY FUSION  11-30-2009   C4 -- C6   CO2 LASER APPLICATION Bilateral 07/23/2015   Procedure: BILATERAL CO2 LASER ABLATION OF THE VULVA;  Surgeon: Adolphus Birchwood, MD;  Location: [facility];  Service: Gynecology;  Laterality: Bilateral;   CO2 LASER APPLICATION N/A 02/25/2016   Procedure: CO2 LASER OF THE VULVAR;  Surgeon: Adolphus Birchwood, MD;  Location[facility];  Service: Gynecology;  Laterality: N/A;   CO2 LASER APPLICATION N/A 05/04/2017   Procedure: CO2 LASER VAPORIZATION OF THE VULVA;  Surgeon: Adolphus Birchwood, MD;  Location: [facility];  Service: Gynecology;  Laterality: N/A;   CO2 LASER APPLICATION N/A 12/27/2018   Procedure: CO2 LASER APPLICATION OF VULVA;  Surgeon: Adolphus Birchwood, MD;  Location: [facility];  Service: Gynecology;  Laterality: N/A;   COLONOSCOPY  01-28-2015   CORONARY ARTERY BYPASS GRAFT N/A 11/25/2017   Procedure:  CORONARY ARTERY BYPASS GRAFTING (CABG), ON PUMP, TIMES THREE, USING LEFT INTERNAL MAMMARY ARTERY AND ENDOSCOPICALLY HARVESTED LEFT GREATER SAPHENOUS VEIN;  Surgeon: Purcell Nails, MD;  Location: [facility] OR;  Service: Open Heart Surgery;  Laterality: N/A;   I & D LEFT BUTTOCK ABSCESS  04-28-2001   LEFT HEART CATH AND CORONARY ANGIOGRAPHY N/A 11/22/2017   Procedure: LEFT HEART CATH AND CORONARY ANGIOGRAPHY;  Surgeon: Runell Gess, MD;  Location: [facility];  Service: Cardiovascular;  Laterality: N/A;   TEE WITHOUT CARDIOVERSION N/A 11/25/2017   Procedure: TRANSESOPHAGEAL ECHOCARDIOGRAM (TEE);  Surgeon: Purcell Nails, MD;  Location: [facility] OR;  Service: Open Heart Surgery;  Laterality: N/A;   VULVECTOMY Right 05/06/2015   Procedure: WIDE LOCAL  EXCISION  OF RIGHT VULVA;  Surgeon: Adolphus Birchwood, MD;  Location: [facility];  Service: Gynecology;  Laterality: Right;   VULVECTOMY N/A 05/04/2017   Procedure: WIDE EXCISION VULVECTOMY;  Surgeon: Adolphus Birchwood, MD;  Location: [facility];  Service: Gynecology;  Laterality: N/A;   VULVECTOMY N/A 12/27/2018   Procedure: WIDE EXCISION VULVECTOMY;  Surgeon: Adolphus Birchwood, MD;  Location: [facility];  Service: Gynecology;  Laterality: N/A;    Past Medical Hx:  Past Medical History:  Diagnosis Date   Coronary artery disease    History of condyloma acuminatum    History of uterine fibroid    History of vaginal dysplasia    VAIN III--  oncologist-  dr Andrey Farmer   History of vulvar dysplasia    VIN III   gyn-oncologist-  dr Andrey Farmer   HIV infection [facility] hatcher   Hypertension    Hypertriglyceridemia without hypercholesterolemia    PONV (postoperative nausea and vomiting)    Type 2 diabetes mellitus without complication, without long-term current use of insulin (HCC) 09/02/2022   VIN III (vulvar intraepithelial neoplasia III)    Wears glasses     Family Hx:  Family History   Problem Relation Age of Onset   Cataracts Mother    Hypertension Father    CAD Paternal Grandfather    Colon cancer Neg Hx     Vitals:  BP 120/73 (BP Location: Left Arm, Patient Position: Sitting)   Pulse 72   Resp 18   Ht 5' 1.5" (1.562 m)   Wt 136 lb (61.7 kg)  BMI 25.28 kg/m    Physical Exam:  Physical Exam HENT:     Head: Normocephalic and atraumatic.  Abdominal:     Palpations: There is no mass.     Tenderness: There is no abdominal tenderness.     Hernia: No hernia is present.  Genitourinary:    Vagina: Normal. No lesions.     Rectum: Normal.       Comments: Hyperpigmented, irregular pigment, white, verrucous plaques Musculoskeletal:     Cervical back: Neck supple.     Right lower leg: No edema.     Left lower leg: No edema.  Lymphadenopathy:     Cervical:     Right cervical: No superficial cervical adenopathy.    Left cervical: No superficial cervical adenopathy.     Lower Body: No right inguinal adenopathy. No left inguinal adenopathy.       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  Immunosuppression w/HIV, Pap smears w/persistent low-grade SIL, hrHPV infection S/P CO2 laser of vulva (circumferential, clitoris) w/right WLDE in 2/20 for VIN 3 and AIN 3 Persistent vulva lesions w/benign histology   >Review the Pap testing of the vagina w/HPV-cotesting >Continue to follow VIN and AIN w/regular surveillance and bx of any concerning areas  >Return prn or in 6 mos     I personally spent 30 minutes face-to-face and non-face-to-face in the care of this patient, which includes all pre, intra, and post visit time on the date of service.    Antionette Char, MD 08/18/2023, 11:29 AM

## 2023-08-18 NOTE — Patient Instructions (Signed)
Return in 6 mos

## 2023-08-18 NOTE — Assessment & Plan Note (Signed)
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  Immunosuppression w/HIV, Pap smears w/persistent low-grade SIL, hrHPV infection S/P CO2 laser of vulva (circumferential, clitoris) w/right WLDE in 2/20 for VIN 3 and AIN 3 Persistent vulva lesions w/benign histology   >Continue to follow VIN and AIN w/regular surveillance and bx of any concerning areas  >Return prn or in 6 mos

## 2023-08-19 ENCOUNTER — Other Ambulatory Visit: Payer: Self-pay

## 2023-08-19 DIAGNOSIS — I10 Essential (primary) hypertension: Secondary | ICD-10-CM

## 2023-08-19 MED ORDER — METOPROLOL TARTRATE 25 MG PO TABS
37.5000 mg | ORAL_TABLET | Freq: Two times a day (BID) | ORAL | 0 refills | Status: DC
Start: 2023-08-19 — End: 2024-02-09

## 2023-08-21 ENCOUNTER — Other Ambulatory Visit: Payer: Self-pay | Admitting: Nurse Practitioner

## 2023-08-21 DIAGNOSIS — E119 Type 2 diabetes mellitus without complications: Secondary | ICD-10-CM

## 2023-08-24 ENCOUNTER — Ambulatory Visit (INDEPENDENT_AMBULATORY_CARE_PROVIDER_SITE_OTHER): Payer: Medicare PPO | Admitting: Podiatry

## 2023-08-24 DIAGNOSIS — M79675 Pain in left toe(s): Secondary | ICD-10-CM

## 2023-08-24 DIAGNOSIS — E119 Type 2 diabetes mellitus without complications: Secondary | ICD-10-CM

## 2023-08-24 DIAGNOSIS — M79674 Pain in right toe(s): Secondary | ICD-10-CM

## 2023-08-24 DIAGNOSIS — B351 Tinea unguium: Secondary | ICD-10-CM

## 2023-08-24 NOTE — Progress Notes (Signed)
  Subjective:  Patient ID: Cathy Smith, female    DOB: Nov 07, 1966,   MRN: [id number]  No chief complaint on file.   57 y.o. female presents for concern of thickened elongated and painful nails that are difficult to trim. Requesting to have them trimmed today. Relates burning and tingling in their feet. Patient is diabetic and last A1c was  Lab Results  Component Value Date   HGBA1C 5.5 08/12/2023   .   PCP:  Ivonne Andrew, NP    . Denies any other pedal complaints. Denies n/v/f/c.   Past Medical History:  Diagnosis Date   Coronary artery disease    History of condyloma acuminatum    History of uterine fibroid    History of vaginal dysplasia    VAIN III--  oncologist-  dr Andrey Farmer   History of vulvar dysplasia    VIN III   gyn-oncologist-  dr Andrey Farmer   HIV infection [facility]) [facility]-  dr hatcher   Hypertension    Hypertriglyceridemia without hypercholesterolemia    PONV (postoperative nausea and vomiting)    Type 2 diabetes mellitus without complication, without long-term current use of insulin (HCC) 09/02/2022   VIN III (vulvar intraepithelial neoplasia III)    Wears glasses     Objective:  Physical Exam: Vascular: DP/PT pulses 2/4 bilateral. CFT <3 seconds. Absent hair growth on digits. Edema noted to bilateral lower extremities. Xerosis noted bilaterally.  Skin. No lacerations or abrasions bilateral feet. Nails 1-5 bilateral  are thickened discolored and elongated with subungual debris.  Musculoskeletal: MMT 5/5 bilateral lower extremities in DF, PF, Inversion and Eversion. Deceased ROM in DF of ankle joint.  Neurological: Sensation intact to light touch. Protective sensation diminished bilateral.    Assessment:   1. Pain due to onychomycosis of toenails of both feet   2. Type 2 diabetes mellitus without complication, without long-term current use of insulin (HCC)       Plan:  Patient was evaluated and treated and all questions  answered. -Discussed and educated patient on diabetic foot care, especially with  regards to the vascular, neurological and musculoskeletal systems.  -Stressed the importance of good glycemic control and the detriment of not  controlling glucose levels in relation to the foot. -Discussed supportive shoes at all times and checking feet regularly.  -Mechanically debrided all nails 1-5 bilateral using sterile nail nipper and filed with dremel without incident  -Answered all patient questions -Patient to return  in 3 months for at risk foot care -Patient advised to call the office if any problems or questions arise in the meantime.   Louann Sjogren, DPM

## 2023-08-31 ENCOUNTER — Ambulatory Visit (INDEPENDENT_AMBULATORY_CARE_PROVIDER_SITE_OTHER): Payer: Medicare PPO

## 2023-08-31 ENCOUNTER — Ambulatory Visit: Payer: Medicare PPO

## 2023-08-31 ENCOUNTER — Other Ambulatory Visit: Payer: Self-pay

## 2023-08-31 DIAGNOSIS — Z23 Encounter for immunization: Secondary | ICD-10-CM | POA: Diagnosis not present

## 2023-09-01 ENCOUNTER — Ambulatory Visit: Payer: Medicare PPO

## 2023-09-20 ENCOUNTER — Ambulatory Visit: Payer: Medicare PPO | Admitting: Dietician

## 2023-11-24 ENCOUNTER — Ambulatory Visit: Payer: Medicare PPO | Admitting: Podiatry

## 2023-12-08 ENCOUNTER — Ambulatory Visit (INDEPENDENT_AMBULATORY_CARE_PROVIDER_SITE_OTHER): Payer: Medicare HMO | Admitting: Podiatry

## 2023-12-08 ENCOUNTER — Encounter: Payer: Self-pay | Admitting: Podiatry

## 2023-12-08 DIAGNOSIS — M79675 Pain in left toe(s): Secondary | ICD-10-CM | POA: Diagnosis not present

## 2023-12-08 DIAGNOSIS — M79674 Pain in right toe(s): Secondary | ICD-10-CM | POA: Diagnosis not present

## 2023-12-08 DIAGNOSIS — B351 Tinea unguium: Secondary | ICD-10-CM

## 2023-12-08 DIAGNOSIS — E119 Type 2 diabetes mellitus without complications: Secondary | ICD-10-CM

## 2023-12-08 NOTE — Progress Notes (Signed)
  Subjective:  Patient ID: Cathy Smith, female    DOB: Apr 03, 1966,   MRN: [id number]  No chief complaint on file.   58 y.o. female presents for concern of thickened elongated and painful nails that are difficult to trim. Requesting to have them trimmed today. Relates burning and tingling in their feet. Patient is diabetic and last A1c was  Lab Results  Component Value Date   HGBA1C 5.5 08/12/2023   .   PCP:  Ivonne Andrew, NP    . Denies any other pedal complaints. Denies n/v/f/c.   Past Medical History:  Diagnosis Date   Coronary artery disease    History of condyloma acuminatum    History of uterine fibroid    History of vaginal dysplasia    VAIN III--  oncologist-  dr Andrey Farmer   History of vulvar dysplasia    VIN III   gyn-oncologist-  dr Andrey Farmer   HIV infection [facility]) [facility]-  dr hatcher   Hypertension    Hypertriglyceridemia without hypercholesterolemia    PONV (postoperative nausea and vomiting)    Type 2 diabetes mellitus without complication, without long-term current use of insulin (HCC) 09/02/2022   VIN III (vulvar intraepithelial neoplasia III)    Wears glasses     Objective:  Physical Exam: Vascular: DP/PT pulses 2/4 bilateral. CFT <3 seconds. Absent hair growth on digits. Edema noted to bilateral lower extremities. Xerosis noted bilaterally.  Skin. No lacerations or abrasions bilateral feet. Nails 1-5 bilateral  are thickened discolored and elongated with subungual debris.  Musculoskeletal: MMT 5/5 bilateral lower extremities in DF, PF, Inversion and Eversion. Deceased ROM in DF of ankle joint.  Neurological: Sensation intact to light touch. Protective sensation diminished bilateral.    Assessment:   1. Pain due to onychomycosis of toenails of both feet   2. Type 2 diabetes mellitus without complication, without long-term current use of insulin (HCC)       Plan:  Patient was evaluated and treated and all questions  answered. -Discussed and educated patient on diabetic foot care, especially with  regards to the vascular, neurological and musculoskeletal systems.  -Stressed the importance of good glycemic control and the detriment of not  controlling glucose levels in relation to the foot. -Discussed supportive shoes at all times and checking feet regularly.  -Mechanically debrided all nails 1-5 bilateral using sterile nail nipper and filed with dremel without incident  -Answered all patient questions -Patient to return  in 3 months for at risk foot care -Patient advised to call the office if any problems or questions arise in the meantime.   Louann Sjogren, DPM

## 2023-12-13 ENCOUNTER — Encounter: Payer: Self-pay | Admitting: Nurse Practitioner

## 2023-12-13 ENCOUNTER — Other Ambulatory Visit: Payer: Self-pay | Admitting: Nurse Practitioner

## 2023-12-13 DIAGNOSIS — Z0000 Encounter for general adult medical examination without abnormal findings: Secondary | ICD-10-CM

## 2023-12-13 DIAGNOSIS — Z1231 Encounter for screening mammogram for malignant neoplasm of breast: Secondary | ICD-10-CM

## 2023-12-20 ENCOUNTER — Encounter: Payer: Self-pay | Admitting: Cardiovascular Disease

## 2023-12-20 ENCOUNTER — Ambulatory Visit: Payer: Medicare PPO | Attending: Cardiovascular Disease | Admitting: Cardiovascular Disease

## 2023-12-20 VITALS — BP 108/64 | HR 60 | Ht 61.0 in | Wt 143.8 lb

## 2023-12-20 DIAGNOSIS — Z951 Presence of aortocoronary bypass graft: Secondary | ICD-10-CM

## 2023-12-20 DIAGNOSIS — E781 Pure hyperglyceridemia: Secondary | ICD-10-CM

## 2023-12-20 DIAGNOSIS — I10 Essential (primary) hypertension: Secondary | ICD-10-CM

## 2023-12-20 NOTE — Patient Instructions (Signed)

## 2023-12-20 NOTE — Assessment & Plan Note (Signed)
 History of essential hypertension blood pressure measured today at 108/64.  She is on metoprolol .

## 2023-12-20 NOTE — Assessment & Plan Note (Signed)
 History of CAD status post coronary artery bypass grafting x 3 by Dr. Nguyen 11/25/2017 with a LIMA to LAD, vein to an obtuse marginal branch and posterolateral branch of the RCA.  This was after cardiac catheterization by myself 11/22/2017 revealed left main/three-vessel disease with normal LV function.  She denies chest pain or shortness of breath.

## 2023-12-20 NOTE — Assessment & Plan Note (Signed)
 History of hyperlipidemia on Crestor  with lipid profile performed 12/16/2022 revealing total cholesterol 119, LDL 58 and HDL 34.

## 2023-12-20 NOTE — Progress Notes (Signed)
 12/20/2023 Cathy Smith   1966/06/25  [id number]  Primary Physician Jerrlyn Morel, NP Primary Cardiologist: Avanell Leigh MD Bennye Bravo, [place]  HPI:  Cathy Smith is a 58 y.o.  moderately overweight married African-American female with no children.  She does not work. She was referred to me by Cydney Draft FNP for new onset chest pain. I last saw her in the office 12/25/2022.  She is accompanied by her husband Derwood Flor today.  She really has no cardiac risk factors other than hypertension hyperlipidemia both of which are treated. There is no family history. She does not smoke. She had onset of chest pain 3-4 months ago,, currently trying to 3 times a week lasting up to 5-10 minutes at a time associated with shortness of breath.  she had a Myoview  stress test that showed severe ischemia in the LAD territory. She presents underwent cardiac catheterization by myself 11/22/17 revealing left main/three-vessel disease with preserved LV function. She underwent coronary artery bypass graft 3  11/25/17 by Dr. Malon Seamen with a  LIMA to LAD, vein to obtuse marginal branch and  Vein to the posterior lateral branch of the RCA. Her postoperative course was uncomplicated.   Since I saw her in the office a year and a half ago she remained stable.  She denies chest pain or shortness of breath.   Current Meds  Medication Sig   Blood Glucose Monitoring Suppl DEVI 1 each by Does not apply route in the morning, at noon, and at bedtime. May substitute to any manufacturer covered by patient's insurance.   darunavir  (PREZISTA ) 800 MG tablet Take 1 tablet (800 mg total) by mouth daily with breakfast.   elvitegravir-cobicistat-emtricitabine -tenofovir  (GENVOYA ) 150-150-200-10 MG TABS tablet Take 1 tablet by mouth daily with breakfast.   furosemide  (LASIX ) 40 MG tablet Take one tablet by mouth daily as needed for swelling   hydrOXYzine  (ATARAX /VISTARIL ) 10 MG tablet Take 1 tablet (10 mg total) by mouth 3  (three) times daily as needed.   meclizine  (ANTIVERT ) 25 MG tablet Take 1 tablet (25 mg total) by mouth 3 (three) times daily as needed for dizziness.   metFORMIN  (GLUCOPHAGE -XR) 500 MG 24 hr tablet TAKE 1 TABLET(500 MG) BY MOUTH DAILY WITH BREAKFAST   rosuvastatin  (CRESTOR ) 10 MG tablet Take 1 tablet (10 mg total) by mouth daily.     No Known Allergies  Social History   Socioeconomic History   Marital status: Married    Spouse name: Not on file   Number of children: 0   Years of education: 12   Highest education level: Not on file  Occupational History   Not on file  Tobacco Use   Smoking status: Never   Smokeless tobacco: Never  Vaping Use   Vaping status: Never Used  Substance and Sexual Activity   Alcohol use: No    Alcohol/week: 0.0 standard drinks of alcohol   Drug use: No   Sexual activity: Yes    Partners: Male    Birth control/protection: Condom    Comment: accepted condoms  Other Topics Concern   Not on file  Social History Narrative   Right handed   [place] home   Lives with husband   Not employed   Social Drivers of Health   Financial Resource Strain: Low Risk  (01/07/2023)   Overall Financial Resource Strain (CARDIA)    Difficulty of Paying Living Expenses: Not hard at all  Food Insecurity: No Food Insecurity (01/07/2023)  Hunger Vital Sign    Worried About Running Out of Food in the Last Year: Never true    Ran Out of Food in the Last Year: Never true  Transportation Needs: No Transportation Needs (01/07/2023)   PRAPARE - Administrator, Civil Service (Medical): No    Lack of Transportation (Non-Medical): No  Physical Activity: Inactive (01/07/2023)   Exercise Vital Sign    Days of Exercise per Week: 0 days    Minutes of Exercise per Session: 0 min  Stress: No Stress Concern Present (01/07/2023)   [employer] of Occupational Health - Occupational Stress Questionnaire    Feeling of Stress : Not at all  Social Connections: Socially  Integrated (01/07/2023)   Social Connection and Isolation Panel [NHANES]    Frequency of Communication with Friends and Family: More than three times a week    Frequency of Social Gatherings with Friends and Family: More than three times a week    Attends Religious Services: More than 4 times per year    Active Member of [employer] or Organizations: Yes    Attends Engineer, structural: More than 4 times per year    Marital Status: Married  Catering manager Violence: Not At Risk (01/07/2023)   Humiliation, Afraid, Rape, and Kick questionnaire    Fear of Current or Ex-Partner: No    Emotionally Abused: No    Physically Abused: No    Sexually Abused: No     Review of Systems: General: negative for chills, fever, night sweats or weight changes.  Cardiovascular: negative for chest pain, dyspnea on exertion, edema, orthopnea, palpitations, paroxysmal nocturnal dyspnea or shortness of breath Dermatological: negative for rash Respiratory: negative for cough or wheezing Urologic: negative for hematuria Abdominal: negative for nausea, vomiting, diarrhea, bright red blood per rectum, melena, or hematemesis Neurologic: negative for visual changes, syncope, or dizziness All other systems reviewed and are otherwise negative except as noted above.    Blood pressure 108/64, pulse 60, height 5\' 1"  (1.549 m), weight 143 lb 12.8 oz (65.2 kg), SpO2 91%.  General appearance: alert and no distress Neck: no adenopathy, no carotid bruit, no JVD, supple, symmetrical, trachea midline, and thyroid  not enlarged, symmetric, no tenderness/mass/nodules Lungs: clear to auscultation bilaterally Heart: regular rate and rhythm, S1, S2 normal, no murmur, click, rub or gallop Extremities: extremities normal, atraumatic, no cyanosis or edema Pulses: 2+ and symmetric Skin: Skin color, texture, turgor normal. No rashes or lesions Neurologic: Grossly normal  EKG EKG Interpretation Date/Time:  Monday December 20 2023 10:45:01 EST Ventricular Rate:  60 PR Interval:  178 QRS Duration:  90 QT Interval:  418 QTC Calculation: 418 R Axis:   -33  Text Interpretation: Normal sinus rhythm Left axis deviation Low voltage QRS RSR' or QR pattern in V1 suggests right ventricular conduction delay T wave abnormality, consider anterolateral ischemia When compared with ECG of 26-Nov-2017 06:55, Vent. rate has decreased BY  32 BPM RSR' pattern in V1 is now Present Minimal criteria for Anterior infarct are no longer Present Confirmed by Lauro Portal [phone]) on 12/20/2023 11:10:18 AM    ASSESSMENT AND PLAN:   Essential hypertension History of essential hypertension blood pressure measured today at 108/64.  She is on metoprolol .  Hypertriglyceridemia without hypercholesterolemia History of hyperlipidemia on Crestor  with lipid profile performed 12/16/2022 revealing total cholesterol 119, LDL 58 and HDL 34.  S/P CABG x 3 History of CAD status post coronary artery bypass grafting x 3 by Dr. Nguyen 11/25/2017  with a LIMA to LAD, vein to an obtuse marginal branch and posterolateral branch of the RCA.  This was after cardiac catheterization by myself 11/22/2017 revealed left main/three-vessel disease with normal LV function.  She denies chest pain or shortness of breath.     Avanell Leigh MD FACP,FACC,FAHA, [facility] 12/20/2023 11:17 AM

## 2024-01-05 ENCOUNTER — Ambulatory Visit: Payer: Self-pay

## 2024-01-12 ENCOUNTER — Ambulatory Visit
Admission: RE | Admit: 2024-01-12 | Discharge: 2024-01-12 | Disposition: A | Discharge status: Home / Self Care | Payer: Self-pay | Source: Ambulatory Visit | Attending: Nurse Practitioner

## 2024-01-12 DIAGNOSIS — Z1231 Encounter for screening mammogram for malignant neoplasm of breast: Secondary | ICD-10-CM | POA: Diagnosis not present

## 2024-01-13 ENCOUNTER — Ambulatory Visit (INDEPENDENT_AMBULATORY_CARE_PROVIDER_SITE_OTHER): Payer: Self-pay

## 2024-01-13 VITALS — Ht 61.0 in | Wt 143.0 lb

## 2024-01-13 DIAGNOSIS — E119 Type 2 diabetes mellitus without complications: Secondary | ICD-10-CM

## 2024-01-13 DIAGNOSIS — Z0000 Encounter for general adult medical examination without abnormal findings: Secondary | ICD-10-CM | POA: Diagnosis not present

## 2024-01-13 NOTE — Progress Notes (Signed)
 Because this visit was a virtual/telehealth visit,  certain criteria was not obtained, such a blood pressure, CBG if applicable, and timed get up and go. Any medications not marked as "taking" were not mentioned during the medication reconciliation part of the visit. Any vitals not documented were not able to be obtained due to this being a telehealth visit or patient was unable to self-report a recent blood pressure reading due to a lack of equipment at home via telehealth. Vitals that have been documented are verbally provided by the patient.   Subjective:   Cathy Smith is a 58 y.o. who presents for a Medicare Wellness preventive visit.  Visit Complete: Virtual I connected with  [name] on 01/13/24 by a video and audio enabled telemedicine application and verified that I am speaking with the correct person using two identifiers.  Patient Location: Home  Provider Location: Home Office  I discussed the limitations of evaluation and management by telemedicine. The patient expressed understanding and agreed to proceed.  Vital Signs: Because this visit was a virtual/telehealth visit, some criteria may be missing or patient reported. Any vitals not documented were not able to be obtained and vitals that have been documented are patient reported.  AWV Questionnaire: No: Patient Medicare AWV questionnaire was not completed prior to this visit.  Cardiac Risk Factors include: advanced age (>57men, >44 women);diabetes mellitus;dyslipidemia;hypertension;sedentary lifestyle     Objective:    Today's Vitals   01/13/24 0913  Weight: 143 lb (64.9 kg)  Height: 5\' 1"  (1.549 m)   Body mass index is 27.02 kg/m.     01/13/2024    9:17 AM 04/22/2023   11:42 AM 03/15/2023   10:11 AM 01/07/2023    9:38 AM 10/16/2022   11:58 AM 02/06/2020   11:38 AM 11/06/2019    3:27 PM  Advanced Directives  Does Patient Have a Medical Advance Directive? No No No No No No No  Would patient like information on  creating a medical advance directive? No - Patient declined No - Patient declined No - Patient declined No - Patient declined Yes (MAU/Ambulatory/Procedural Areas - Information given) No - Patient declined No - Patient declined    Current Medications (verified) Outpatient Encounter Medications as of 01/13/2024  Medication Sig   Blood Glucose Monitoring Suppl DEVI 1 each by Does not apply route in the morning, at noon, and at bedtime. May substitute to any manufacturer covered by patient's insurance.   darunavir (PREZISTA) 800 MG tablet Take 1 tablet (800 mg total) by mouth daily with breakfast.   elvitegravir-cobicistat-emtricitabine-tenofovir (GENVOYA) 150-150-200-10 MG TABS tablet Take 1 tablet by mouth daily with breakfast.   furosemide (LASIX) 40 MG tablet Take one tablet by mouth daily as needed for swelling   hydrOXYzine (ATARAX/VISTARIL) 10 MG tablet Take 1 tablet (10 mg total) by mouth 3 (three) times daily as needed.   meclizine (ANTIVERT) 25 MG tablet Take 1 tablet (25 mg total) by mouth 3 (three) times daily as needed for dizziness.   metFORMIN (GLUCOPHAGE-XR) 500 MG 24 hr tablet TAKE 1 TABLET(500 MG) BY MOUTH DAILY WITH BREAKFAST   metoprolol tartrate (LOPRESSOR) 25 MG tablet Take 1.5 tablets (37.5 mg total) by mouth 2 (two) times daily.   rosuvastatin (CRESTOR) 10 MG tablet Take 1 tablet (10 mg total) by mouth daily.   [DISCONTINUED] atorvastatin (LIPITOR) 20 MG tablet TAKE 1 TABLET(20 MG) BY MOUTH DAILY AT 6 PM   No facility-administered encounter medications on file as of 01/13/2024.  Allergies (verified) Patient has no known allergies.   History: Past Medical History:  Diagnosis Date   Coronary artery disease    History of condyloma acuminatum    History of uterine fibroid    History of vaginal dysplasia    VAIN III--  oncologist-  dr Andrey Farmer   History of vulvar dysplasia    VIN III   gyn-oncologist-  dr Andrey Farmer   HIV infection [facility]-   dr hatcher   Hypertension    Hypertriglyceridemia without hypercholesterolemia    PONV (postoperative nausea and vomiting)    Type 2 diabetes mellitus without complication, without long-term current use of insulin (HCC) 09/02/2022   VIN III (vulvar intraepithelial neoplasia III)    Wears glasses    Past Surgical History:  Procedure Laterality Date   ABDOMINAL HYSTERECTOMY  1995 approx   ANTERIOR CERVICAL DECOMP/DISCECTOMY FUSION  11-30-2009   C4 -- C6   CO2 LASER APPLICATION Bilateral 07/23/2015   Procedure: BILATERAL CO2 LASER ABLATION OF THE VULVA;  Surgeon: Adolphus Birchwood, MD;  Location: [facility];  Service: Gynecology;  Laterality: Bilateral;   CO2 LASER APPLICATION N/A 02/25/2016   Procedure: CO2 LASER OF THE VULVAR;  Surgeon: Adolphus Birchwood, MD;  Location: [facility];  Service: Gynecology;  Laterality: N/A;   CO2 LASER APPLICATION N/A 05/04/2017   Procedure: CO2 LASER VAPORIZATION OF THE VULVA;  Surgeon: Adolphus Birchwood, MD;  Location: [facility];  Service: Gynecology;  Laterality: N/A;   CO2 LASER APPLICATION N/A 12/27/2018   Procedure: CO2 LASER APPLICATION OF VULVA;  Surgeon: Adolphus Birchwood, MD;  Location: [facility];  Service: Gynecology;  Laterality: N/A;   COLONOSCOPY  01-28-2015   CORONARY ARTERY BYPASS GRAFT N/A 11/25/2017   Procedure: CORONARY ARTERY BYPASS GRAFTING (CABG), ON PUMP, TIMES THREE, USING LEFT INTERNAL MAMMARY ARTERY AND ENDOSCOPICALLY HARVESTED LEFT GREATER SAPHENOUS VEIN;  Surgeon: Purcell Nails, MD;  Location: [facility] OR;  Service: Open Heart Surgery;  Laterality: N/A;   I & D LEFT BUTTOCK ABSCESS  04-28-2001   LEFT HEART CATH AND CORONARY ANGIOGRAPHY N/A 11/22/2017   Procedure: LEFT HEART CATH AND CORONARY ANGIOGRAPHY;  Surgeon: Runell Gess, MD;  Location: [facility];  Service: Cardiovascular;  Laterality: N/A;   TEE WITHOUT CARDIOVERSION N/A 11/25/2017   Procedure: TRANSESOPHAGEAL ECHOCARDIOGRAM (TEE);   Surgeon: Purcell Nails, MD;  Location: [facility] OR;  Service: Open Heart Surgery;  Laterality: N/A;   VULVECTOMY Right 05/06/2015   Procedure: WIDE LOCAL  EXCISION  OF RIGHT VULVA;  Surgeon: Adolphus Birchwood, MD;  Location: [facility];  Service: Gynecology;  Laterality: Right;   VULVECTOMY N/A 05/04/2017   Procedure: WIDE EXCISION VULVECTOMY;  Surgeon: Adolphus Birchwood, MD;  Location: [facility];  Service: Gynecology;  Laterality: N/A;   VULVECTOMY N/A 12/27/2018   Procedure: WIDE EXCISION VULVECTOMY;  Surgeon: Adolphus Birchwood, MD;  Location: [facility];  Service: Gynecology;  Laterality: N/A;   Family History  Problem Relation Age of Onset   Cataracts Mother    Hypertension Father    CAD Paternal Grandfather    Colon cancer Neg Hx    Social History   Socioeconomic History   Marital status: Married    Spouse name: Not on file   Number of children: 0   Years of education: 12   Highest education level: Not on file  Occupational History   Not on file  Tobacco Use  Smoking status: Never   Smokeless tobacco: Never  Vaping Use   Vaping status: Never Used  Substance and Sexual Activity   Alcohol use: No    Alcohol/week: 0.0 standard drinks of alcohol   Drug use: No   Sexual activity: Yes    Partners: Male    Birth control/protection: Condom    Comment: accepted condoms  Other Topics Concern   Not on file  Social History Narrative   Right handed   [place] home   Lives with husband   Not employed   Social Drivers of Health   Financial Resource Strain: Low Risk  (01/13/2024)   Overall Financial Resource Strain (CARDIA)    Difficulty of Paying Living Expenses: Not hard at all  Food Insecurity: No Food Insecurity (01/13/2024)   Hunger Vital Sign    Worried About Running Out of Food in the Last Year: Never true    Ran Out of Food in the Last Year: Never true  Transportation Needs: No Transportation Needs (01/13/2024)   PRAPARE - Doctor, general practice (Medical): No    Lack of Transportation (Non-Medical): No  Physical Activity: Patient Declined (01/13/2024)   Exercise Vital Sign    Days of Exercise per Week: Patient declined    Minutes of Exercise per Session: Patient declined  Stress: No Stress Concern Present (01/13/2024)   [employer] of Occupational Health - Occupational Stress Questionnaire    Feeling of Stress : Not at all  Social Connections: Socially Integrated (01/13/2024)   Social Connection and Isolation Panel [NHANES]    Frequency of Communication with Friends and Family: More than three times a week    Frequency of Social Gatherings with Friends and Family: More than three times a week    Attends Religious Services: More than 4 times per year    Active Member of [employer] or Organizations: Yes    Attends Engineer, structural: More than 4 times per year    Marital Status: Married    Tobacco Counseling Counseling given: Yes    Clinical Intake:  Pre-visit preparation completed: Yes  Pain : No/denies pain     BMI - recorded: 27.02 Nutritional Risks: None Diabetes: Yes CBG done?: No Did pt. bring in CBG monitor from home?: No  How often do you need to have someone help you when you read instructions, pamphlets, or other written materials from your doctor or pharmacy?: 1 - Never  Interpreter Needed?: No  Information entered by :: A Deirdre Gryder, CMA   Activities of Daily Living     01/13/2024    9:17 AM  In your present state of health, do you have any difficulty performing the following activities:  Hearing? 0  Vision? 0  Difficulty concentrating or making decisions? 0  Walking or climbing stairs? 0  Dressing or bathing? 0  Doing errands, shopping? 0  Preparing Food and eating ? N  Using the Toilet? N  In the past six months, have you accidently leaked urine? N  Do you have problems with loss of bowel control? N  Managing your Medications? N  Managing your Finances? N   Housekeeping or managing your Housekeeping? N    Patient Care Team: Ivonne Andrew, NP as PCP - General (Pulmonary Disease) Runell Gess, MD as PCP - Cardiology (Cardiology) Adolphus Birchwood, MD as Consulting Physician (Obstetrics and Gynecology)  Indicate any recent Medical Services you may have received from other than [facility] providers in the past year (date  may be approximate).     Assessment:   This is a routine wellness examination for Masyn.  Hearing/Vision screen Hearing Screening - Comments:: Patient denies any hearing difficulties.   Vision Screening - Comments:: Wears rx glasses - up to date with routine eye exams  Patient sees [facility]   Goals Addressed             This Visit's Progress    Patient Stated       Try to maintain and increase my activity as the weather gets warmer       Depression Screen     01/13/2024    9:18 AM 05/06/2023   11:01 AM 02/04/2023    3:58 PM 01/21/2023   11:45 AM 01/07/2023    9:36 AM 12/16/2022   10:19 AM 12/16/2022   10:18 AM  PHQ 2/9 Scores  PHQ - 2 Score 0 0 0 4 0 2 0  PHQ- 9 Score 0  1 7 0 6     Fall Risk     01/13/2024    9:17 AM 05/06/2023   11:01 AM 04/22/2023   11:42 AM 03/15/2023   10:11 AM 01/07/2023    9:37 AM  Fall Risk   Falls in the past year? 0 0 0 0 0  Number falls in past yr: 0 0 0 0 0  Injury with Fall? 0 0 0 0 0  Risk for fall due to : No Fall Risks No Fall Risks   No Fall Risks  Follow up Falls prevention discussed;Falls evaluation completed Falls evaluation completed Falls evaluation completed Falls evaluation completed Falls prevention discussed    MEDICARE RISK AT HOME:  Medicare Risk at Home Any stairs in or around the home?: No If so, are there any without handrails?: No Home free of loose throw rugs in walkways, pet beds, electrical cords, etc?: Yes Adequate lighting in your home to reduce risk of falls?: Yes Life alert?: No Use of a cane, walker or w/c?: No Grab bars in the bathroom?:  No Shower chair or bench in shower?: No Elevated toilet seat or a handicapped toilet?: No  TIMED UP AND GO:  Was the test performed?  No  Cognitive Function: 6CIT completed      03/15/2023   11:00 AM  Montreal Cognitive Assessment   Visuospatial/ Executive (0/5) 2  Naming (0/3) 3  Attention: Read list of digits (0/2) 1  Attention: Read list of letters (0/1) 1  Attention: Serial 7 subtraction starting at 100 (0/3) 0  Language: Repeat phrase (0/2) 1  Language : Fluency (0/1) 1  Abstraction (0/2) 1  Delayed Recall (0/5) 5  Orientation (0/6) 6  Total 21  Adjusted Score (based on education) 22      01/13/2024    9:16 AM 01/07/2023    9:38 AM  6CIT Screen  What Year? 0 points 0 points  What month? 0 points 0 points  What time? 0 points 0 points  Count back from 20 0 points 0 points  Months in reverse 0 points 0 points  Repeat phrase 0 points 0 points  Total Score 0 points 0 points    Immunizations Immunization History  Administered Date(s) Administered   H1N1 10/18/2008   Hepatitis B 01/13/2001, 02/02/2001, 03/02/2001, 11/16/2001   Hepatitis B, ADULT 02/05/2014, 08/08/2014, 02/11/2015   Influenza Split 09/02/2011, 12/20/2012   Influenza Whole 08/30/2006, 08/03/2007, 09/24/2008, 08/07/2009, 09/11/2010   Influenza, Seasonal, Injecte, Preservative Fre 08/12/2023   Influenza,inj,Quad PF,6+ Mos 08/02/2013,  08/08/2014, 08/27/2015, 08/19/2016, 09/20/2017, 08/15/2018, 08/16/2019, 08/12/2020, 08/04/2021, 08/26/2022   Meningococcal Mcv4o 04/21/2017   PFIZER(Purple Top)SARS-COV-2 Vaccination 01/25/2020, 02/19/2020, 08/30/2020   PNEUMOCOCCAL CONJUGATE-20 04/07/2022   Pfizer Covid-19 Vaccine Bivalent Booster 3yrs & up 08/04/2021   Pfizer(Comirnaty)Fall Seasonal Vaccine 12 years and older 08/26/2022, 08/31/2023   Pneumococcal Conjugate-13 01/13/2016   Pneumococcal Polysaccharide-23 08/30/2006, 12/31/2010   Tdap 09/06/2014    Screening Tests Health Maintenance  Topic Date Due    OPHTHALMOLOGY EXAM  Never done   Zoster Vaccines- Shingrix (1 of 2) Never done   COVID-19 Vaccine (7 - 2024-25 season) 10/26/2023   Diabetic kidney evaluation - Urine ACR  02/05/2024   HEMOGLOBIN A1C  02/10/2024   FOOT EXAM  02/23/2024   Diabetic kidney evaluation - eGFR measurement  05/11/2024   Cervical Cancer Screening (Pap smear)  06/21/2024   DTaP/Tdap/Td (2 - Td or Tdap) 09/06/2024   MAMMOGRAM  01/11/2025   Medicare Annual Wellness (AWV)  01/12/2025   Colonoscopy  01/27/2025   Pneumococcal Vaccine 75-52 Years old  Completed   INFLUENZA VACCINE  Completed   Hepatitis C Screening  Completed   HIV Screening  Completed   HPV VACCINES  Aged Out    Health Maintenance  Health Maintenance Due  Topic Date Due   OPHTHALMOLOGY EXAM  Never done   Zoster Vaccines- Shingrix (1 of 2) Never done   COVID-19 Vaccine (7 - 2024-25 season) 10/26/2023   Diabetic kidney evaluation - Urine ACR  02/05/2024   Health Maintenance Items Addressed: UACR (Urine Albumin:Creatinine Ratio)  Additional Screening:  Vision Screening: Recommended annual ophthalmology exams for early detection of glaucoma and other disorders of the eye.  Dental Screening: Recommended annual dental exams for proper oral hygiene  Community Resource Referral / Chronic Care Management: CRR required this visit?  No   CCM required this visit?  No     Plan:     I have personally reviewed and noted the following in the patient's chart:   Medical and social history Use of alcohol, tobacco or illicit drugs  Current medications and supplements including opioid prescriptions. Patient is not currently taking opioid prescriptions. Functional ability and status Nutritional status Physical activity Advanced directives List of other physicians Hospitalizations, surgeries, and ER visits in previous 12 months Vitals Screenings to include cognitive, depression, and falls Referrals and appointments  In addition, I have  reviewed and discussed with patient certain preventive protocols, quality metrics, and best practice recommendations. A written personalized care plan for preventive services as well as general preventive health recommendations were provided to patient.     Jordan Hawks Kisean Rollo, CMA   01/13/2024   After Visit Summary: (MyChart) Due to this being a telephonic visit, the after visit summary with patients personalized plan was offered to patient via MyChart   Notes: Nothing significant to report at this time.

## 2024-01-13 NOTE — Patient Instructions (Signed)
 Cathy Smith , Thank you for taking time to come for your Medicare Wellness Visit. I appreciate your ongoing commitment to your health goals. Please review the following plan we discussed and let me know if I can assist you in the future.   Referrals/Orders/Follow-Ups/Clinician Recommendations:  Next Medicare Annual Wellness Visit:   January 18, 2025 at 11:20 am video visit.   A diabetic lab has been ordered for you. Please have this done before you see Angus Seller on February 11, 2024.   This is a list of the screening recommended for you and due dates:  Health Maintenance  Topic Date Due   Eye exam for diabetics  Never done   Zoster (Shingles) Vaccine (1 of 2) Never done   COVID-19 Vaccine (7 - 2024-25 season) 10/26/2023   Yearly kidney health urinalysis for diabetes  02/05/2024   Hemoglobin A1C  02/10/2024   Complete foot exam   02/23/2024   Yearly kidney function blood test for diabetes  05/11/2024   Pap Smear  06/21/2024   DTaP/Tdap/Td vaccine (2 - Td or Tdap) 09/06/2024   Mammogram  01/11/2025   Medicare Annual Wellness Visit  01/12/2025   Colon Cancer Screening  01/27/2025   Pneumococcal Vaccination  Completed   Flu Shot  Completed   Hepatitis C Screening  Completed   HIV Screening  Completed   HPV Vaccine  Aged Out    Advanced directives: (Declined) Advance directive discussed with you today. Even though you declined this today, please call our office should you change your mind, and we can give you the proper paperwork for you to fill out.  Next Medicare Annual Wellness Visit scheduled for next year: yes  Understanding Your Risk for Falls Millions of people have serious injuries from falls each year. It is important to understand your risk of falling. Talk with your health care provider about your risk and what you can do to lower it. If you do have a serious fall, make sure to tell your provider. Falling once raises your risk of falling again. How can falls affect me? Serious  injuries from falls are common. These include: Broken bones, such as hip fractures. Head injuries, such as traumatic brain injuries (TBI) or concussions. A fear of falling can cause you to avoid activities and stay at home. This can make your muscles weaker and raise your risk for a fall. What can increase my risk? There are a number of risk factors that increase your risk for falling. The more risk factors you have, the higher your risk of falling. Serious injuries from a fall happen most often to people who are older than 60 years old. Teenagers and young adults ages 64-29 are also at higher risk. Common risk factors include: Weakness in the lower body. Being generally weak or confused due to long-term (chronic) illness. Dizziness or balance problems. Poor vision. Medicines that cause dizziness or drowsiness. These may include: Medicines for your blood pressure, heart, anxiety, insomnia, or swelling (edema). Pain medicines. Muscle relaxants. Other risk factors include: Drinking alcohol. Having had a fall in the past. Having foot pain or wearing improper footwear. Working at a dangerous job. Having any of the following in your home: Tripping hazards, such as floor clutter or loose rugs. Poor lighting. Pets. Having dementia or memory loss. What actions can I take to lower my risk of falling?     Physical activity Stay physically fit. Do strength and balance exercises. Consider taking a regular class to build strength  and balance. Yoga and tai chi are good options. Vision Have your eyes checked every year and your prescription for glasses or contacts updated as needed. Shoes and walking aids Wear non-skid shoes. Wear shoes that have rubber soles and low heels. Do not wear high heels. Do not walk around the house in socks or slippers. Use a cane or walker as told by your provider. Home safety Attach secure railings on both sides of your stairs. Install grab bars for your  bathtub, shower, and toilet. Use a non-skid mat in your bathtub or shower. Attach bath mats securely with double-sided, non-slip rug tape. Use good lighting in all rooms. Keep a flashlight near your bed. Make sure there is a clear path from your bed to the bathroom. Use night-lights. Do not use throw rugs. Make sure all carpeting is taped or tacked down securely. Remove all clutter from walkways and stairways, including extension cords. Repair uneven or broken steps and floors. Avoid walking on icy or slippery surfaces. Walk on the grass instead of on icy or slick sidewalks. Use ice melter to get rid of ice on walkways in the winter. Use a cordless phone. Questions to ask your health care provider Can you help me check my risk for a fall? Do any of my medicines make me more likely to fall? Should I take a vitamin D supplement? What exercises can I do to improve my strength and balance? Should I make an appointment to have my vision checked? Do I need a bone density test to check for weak bones (osteoporosis)? Would it help to use a cane or a walker? Where to find more information Centers for Disease Control and Prevention, STEADI: TonerPromos.no Community-Based Fall Prevention Programs: TonerPromos.no [employer] on Aging: BaseRingTones.pl Contact a health care provider if: You fall at home. You are afraid of falling at home. You feel weak, drowsy, or dizzy. This information is not intended to replace advice given to you by your health care provider. Make sure you discuss any questions you have with your health care provider. Document Revised: 06/29/2022 Document Reviewed: 06/29/2022 Elsevier Patient Education  2024 [employer].

## 2024-01-17 NOTE — Telephone Encounter (Signed)
 Pt has an eye appt on June 5th @ 10:20 with [facility] and wanted to let you know.

## 2024-02-09 ENCOUNTER — Other Ambulatory Visit: Payer: Self-pay

## 2024-02-09 DIAGNOSIS — I10 Essential (primary) hypertension: Secondary | ICD-10-CM

## 2024-02-09 MED ORDER — METOPROLOL TARTRATE 25 MG PO TABS: 37.5000 mg | ORAL_TABLET | Freq: Two times a day (BID) | ORAL | 0 refills | Status: DC

## 2024-02-10 ENCOUNTER — Ambulatory Visit (INDEPENDENT_AMBULATORY_CARE_PROVIDER_SITE_OTHER): Payer: Self-pay | Admitting: Nurse Practitioner

## 2024-02-10 ENCOUNTER — Encounter: Payer: Self-pay | Admitting: Nurse Practitioner

## 2024-02-10 VITALS — BP 112/76 | HR 73 | Temp 97.7°F | Wt 143.4 lb

## 2024-02-10 DIAGNOSIS — E119 Type 2 diabetes mellitus without complications: Secondary | ICD-10-CM

## 2024-02-10 DIAGNOSIS — Z13220 Encounter for screening for lipoid disorders: Secondary | ICD-10-CM | POA: Diagnosis not present

## 2024-02-10 LAB — POCT GLYCOSYLATED HEMOGLOBIN (HGB A1C): Hemoglobin A1C: 5.6 % (ref 4.0–5.6)

## 2024-02-10 MED ORDER — METFORMIN HCL ER 500 MG PO TB24: ORAL_TABLET | ORAL | 1 refills | Status: DC

## 2024-02-10 NOTE — Progress Notes (Signed)
 Subjective   Patient ID: Cathy Smith, female    DOB: 10-10-1966, 58 y.o.   MRN: [id number]  Chief Complaint  Patient presents with   Diabetes    Follow up    Referring provider: Ivonne Andrew, NP  Cathy Smith is a 58 y.o. female with Past Medical History: No date: Coronary artery disease No date: History of condyloma acuminatum No date: History of uterine fibroid No date: History of vaginal dysplasia     Comment:  VAIN III--  oncologist-  dr Andrey Farmer No date: History of vulvar dysplasia     Comment:  VIN III   gyn-oncologist-  dr Andrey Farmer [facility]  dr hatcher: HIV infection  [facility]) No date: Hypertension No date: Hypertriglyceridemia without hypercholesterolemia No date: PONV (postoperative nausea and vomiting) 09/02/2022: Type 2 diabetes mellitus without complication, without  long-term current use of insulin (HCC) No date: VIN III (vulvar intraepithelial neoplasia III) No date: Wears glasses   HPI  Hypertension: Patient here for follow-up of elevated blood pressure. She is not exercising and is adherent to low salt diet.  Blood pressure is well controlled at home. Cardiac symptoms none. Patient denies none.  Cardiovascular risk factors: hypertension, obesity (BMI >= 30 kg/m2), and sedentary lifestyle. Use of agents associated with hypertension: none. History of target organ damage: none. States that she regularly takes B/P at home and it is similar to today's values. Currently compliant with all medications.   Diabetes: Presents for follow up on diabetes. Currently on metformin for diabetes - working on diabetic diet. Patient has been to nutritionist, but states that she is still eating unhealthy. Did discuss healthy food choices today. A1c in office today was 5.6. Denies f/c/s, n/v/d, hemoptysis, PND, leg swelling Denies chest pain or edema     Denies f/c/s, n/v/d, hemoptysis, PND, leg swelling Denies chest pain or edema   No Known  Allergies  Immunization History  Administered Date(s) Administered   H1N1 10/18/2008   Hepatitis B 01/13/2001, 02/02/2001, 03/02/2001, 11/16/2001   Hepatitis B, ADULT 02/05/2014, 08/08/2014, 02/11/2015   Influenza Split 09/02/2011, 12/20/2012   Influenza Whole 08/30/2006, 08/03/2007, 09/24/2008, 08/07/2009, 09/11/2010   Influenza, Seasonal, Injecte, Preservative Fre 08/12/2023   Influenza,inj,Quad PF,6+ Mos 08/02/2013, 08/08/2014, 08/27/2015, 08/19/2016, 09/20/2017, 08/15/2018, 08/16/2019, 08/12/2020, 08/04/2021, 08/26/2022   Meningococcal Mcv4o 04/21/2017   PFIZER(Purple Top)SARS-COV-2 Vaccination 01/25/2020, 02/19/2020, 08/30/2020   PNEUMOCOCCAL CONJUGATE-20 04/07/2022   Pfizer Covid-19 Vaccine Bivalent Booster 38yrs & up 08/04/2021   Pfizer(Comirnaty)Fall Seasonal Vaccine 12 years and older 08/26/2022, 08/31/2023   Pneumococcal Conjugate-13 01/13/2016   Pneumococcal Polysaccharide-23 08/30/2006, 12/31/2010   Tdap 09/06/2014    Tobacco History: Social History   Tobacco Use  Smoking Status Never  Smokeless Tobacco Never   Counseling given: Not Answered   Outpatient Encounter Medications as of 02/10/2024  Medication Sig   Blood Glucose Monitoring Suppl DEVI 1 each by Does not apply route in the morning, at noon, and at bedtime. May substitute to any manufacturer covered by patient's insurance.   darunavir (PREZISTA) 800 MG tablet Take 1 tablet (800 mg total) by mouth daily with breakfast.   elvitegravir-cobicistat-emtricitabine-tenofovir (GENVOYA) 150-150-200-10 MG TABS tablet Take 1 tablet by mouth daily with breakfast.   furosemide (LASIX) 40 MG tablet Take one tablet by mouth daily as needed for swelling   hydrOXYzine (ATARAX/VISTARIL) 10 MG tablet Take 1 tablet (10 mg total) by mouth 3 (three) times daily as needed.   meclizine (ANTIVERT) 25 MG tablet Take 1 tablet (25  mg total) by mouth 3 (three) times daily as needed for dizziness.   metoprolol tartrate (LOPRESSOR) 25 MG  tablet Take 1.5 tablets (37.5 mg total) by mouth 2 (two) times daily.   rosuvastatin (CRESTOR) 10 MG tablet Take 1 tablet (10 mg total) by mouth daily.   [DISCONTINUED] metFORMIN (GLUCOPHAGE-XR) 500 MG 24 hr tablet TAKE 1 TABLET(500 MG) BY MOUTH DAILY WITH BREAKFAST   metFORMIN (GLUCOPHAGE-XR) 500 MG 24 hr tablet TAKE 1 TABLET(500 MG) BY MOUTH DAILY WITH BREAKFAST   [DISCONTINUED] atorvastatin (LIPITOR) 20 MG tablet TAKE 1 TABLET(20 MG) BY MOUTH DAILY AT 6 PM   No facility-administered encounter medications on file as of 02/10/2024.    Review of Systems  Review of Systems  Constitutional: Negative.   HENT: Negative.    Cardiovascular: Negative.   Gastrointestinal: Negative.   Allergic/Immunologic: Negative.   Neurological: Negative.   Psychiatric/Behavioral: Negative.       Objective:   BP 112/76   Pulse 73   Temp 97.7 F (36.5 C)   Wt 143 lb 6.4 oz (65 kg)   SpO2 100%   BMI 27.10 kg/m   Wt Readings from Last 5 Encounters:  02/10/24 143 lb 6.4 oz (65 kg)  01/13/24 143 lb (64.9 kg)  12/20/23 143 lb 12.8 oz (65.2 kg)  08/18/23 136 lb (61.7 kg)  08/12/23 136 lb 12.8 oz (62.1 kg)     Physical Exam Vitals and nursing note reviewed.  Constitutional:      General: She is not in acute distress.    Appearance: She is well-developed.  Cardiovascular:     Rate and Rhythm: Normal rate and regular rhythm.  Pulmonary:     Effort: Pulmonary effort is normal.     Breath sounds: Normal breath sounds.  Neurological:     Mental Status: She is alert and oriented to person, place, and time.       Assessment & Plan:   Lipid screening -     Lipid panel  Type 2 diabetes mellitus without complication, without long-term current use of insulin (HCC) -     POCT glycosylated hemoglobin (Hb A1C) -     Microalbumin / creatinine urine ratio -     metFORMIN HCl ER; TAKE 1 TABLET(500 MG) BY MOUTH DAILY WITH BREAKFAST  Dispense: 90 tablet; Refill: 1 -     CBC -     Comprehensive  metabolic panel with GFR -     Lipid panel     Return in about 3 months (around 05/11/2024).   Ivonne Andrew, NP 02/10/2024

## 2024-02-11 LAB — CBC
Hematocrit: 36.1 % (ref 34.0–46.6)
Hemoglobin: 12.4 g/dL (ref 11.1–15.9)
MCH: 33.4 pg — ABNORMAL HIGH (ref 26.6–33.0)
MCHC: 34.3 g/dL (ref 31.5–35.7)
MCV: 97 fL (ref 79–97)
Platelets: 155 10*3/uL (ref 150–450)
RBC: 3.71 x10E6/uL — ABNORMAL LOW (ref 3.77–5.28)
RDW: 11.1 % — ABNORMAL LOW (ref 11.7–15.4)
WBC: 4 10*3/uL (ref 3.4–10.8)

## 2024-02-11 LAB — COMPREHENSIVE METABOLIC PANEL WITH GFR
ALT: 19 IU/L (ref 0–32)
AST: 19 IU/L (ref 0–40)
Albumin: 4.5 g/dL (ref 3.8–4.9)
Alkaline Phosphatase: 65 IU/L (ref 44–121)
BUN/Creatinine Ratio: 12 (ref 12–28)
BUN: 10 mg/dL (ref 8–27)
Bilirubin Total: 0.5 mg/dL (ref 0.0–1.2)
CO2: 23 mmol/L (ref 20–29)
Calcium: 9.1 mg/dL (ref 8.7–10.3)
Chloride: 103 mmol/L (ref 96–106)
Creatinine, Ser: 0.86 mg/dL (ref 0.57–1.00)
Globulin, Total: 2.5 g/dL (ref 1.5–4.5)
Glucose: 105 mg/dL — ABNORMAL HIGH (ref 70–99)
Potassium: 3.9 mmol/L (ref 3.5–5.2)
Sodium: 141 mmol/L (ref 134–144)
Total Protein: 7 g/dL (ref 6.0–8.5)
eGFR: 77 mL/min/{1.73_m2} (ref >59)

## 2024-02-11 LAB — LIPID PANEL
Chol/HDL Ratio: 2.6 ratio (ref 0.0–4.4)
Cholesterol, Total: 138 mg/dL (ref 100–199)
HDL: 54 mg/dL (ref >39)
LDL Chol Calc (NIH): 67 mg/dL (ref 0–99)
Triglycerides: 87 mg/dL (ref 0–149)
VLDL Cholesterol Cal: 17 mg/dL (ref 5–40)

## 2024-02-11 LAB — MICROALBUMIN / CREATININE URINE RATIO
Creatinine, Urine: 170.8 mg/dL
Microalb/Creat Ratio: 12 mg/g{creat} (ref 0–29)
Microalbumin, Urine: 21 ug/mL

## 2024-02-14 ENCOUNTER — Other Ambulatory Visit: Payer: Self-pay

## 2024-02-14 DIAGNOSIS — I10 Essential (primary) hypertension: Secondary | ICD-10-CM

## 2024-03-03 ENCOUNTER — Telehealth: Payer: Self-pay | Admitting: Nurse Practitioner

## 2024-03-03 NOTE — Telephone Encounter (Signed)
 Copied from CRM [id number]. Topic: Clinical - Medical Advice >> Mar 03, 2024  9:41 AM Alpha Arts wrote: Patient would like to know what she can eat in the place of chips. She stated Abbey Hobby NP told her she could not eat anymore.   Callback #: [phone]

## 2024-03-03 NOTE — Telephone Encounter (Signed)
 Please advise [facility]

## 2024-03-06 ENCOUNTER — Telehealth: Payer: Self-pay

## 2024-03-06 NOTE — Telephone Encounter (Unsigned)
 Copied from CRM [id number]. Topic: Clinical - Medical Advice >> Mar 06, 2024  3:46 PM Georgeann Kindred wrote: Reason for CRM: Patient called requesting advice on what to eat in place of chips. Please contact patient back at [phone].

## 2024-03-13 ENCOUNTER — Encounter: Payer: Self-pay | Admitting: Podiatry

## 2024-03-13 ENCOUNTER — Ambulatory Visit: Payer: Medicare HMO | Admitting: Podiatry

## 2024-03-13 DIAGNOSIS — M79675 Pain in left toe(s): Secondary | ICD-10-CM

## 2024-03-13 DIAGNOSIS — B351 Tinea unguium: Secondary | ICD-10-CM | POA: Diagnosis not present

## 2024-03-13 DIAGNOSIS — E119 Type 2 diabetes mellitus without complications: Secondary | ICD-10-CM

## 2024-03-13 DIAGNOSIS — M79674 Pain in right toe(s): Secondary | ICD-10-CM

## 2024-03-13 NOTE — Progress Notes (Signed)
  Subjective:  Patient ID: Cathy Smith, female    DOB: 05/19/1966,   MRN: [id number]  Chief Complaint  Patient presents with   Debridement    Trim toenails - diabetic - 5.6    58 y.o. female presents for concern of thickened elongated and painful nails that are difficult to trim. Requesting to have them trimmed today. Relates burning and tingling in their feet. Patient is diabetic and last A1c was  Lab Results  Component Value Date   HGBA1C 5.6 02/10/2024   .   PCP:  Jerrlyn Morel, NP    . Denies any other pedal complaints. Denies n/v/f/c.   Past Medical History:  Diagnosis Date   Coronary artery disease    History of condyloma acuminatum    History of uterine fibroid    History of vaginal dysplasia    VAIN III--  oncologist-  dr Pearly Bound   History of vulvar dysplasia    VIN III   gyn-oncologist-  dr Pearly Bound   HIV infection [facility]) [facility] hatcher   Hypertension    Hypertriglyceridemia without hypercholesterolemia    PONV (postoperative nausea and vomiting)    Type 2 diabetes mellitus without complication, without long-term current use of insulin  (HCC) 09/02/2022   VIN III (vulvar intraepithelial neoplasia III)    Wears glasses     Objective:  Physical Exam: Vascular: DP/PT pulses 2/4 bilateral. CFT <3 seconds. Absent hair growth on digits. Edema noted to bilateral lower extremities. Xerosis noted bilaterally.  Skin. No lacerations or abrasions bilateral feet. Nails 1-5 bilateral  are thickened discolored and elongated with subungual debris.  Musculoskeletal: MMT 5/5 bilateral lower extremities in DF, PF, Inversion and Eversion. Deceased ROM in DF of ankle joint.  Neurological: Sensation intact to light touch. Protective sensation diminished bilateral.    Assessment:   1. Pain due to onychomycosis of toenails of both feet   2. Type 2 diabetes mellitus without complication, without long-term current use of insulin  (HCC)       Plan:   Patient was evaluated and treated and all questions answered. -Discussed and educated patient on diabetic foot care, especially with  regards to the vascular, neurological and musculoskeletal systems.  -Stressed the importance of good glycemic control and the detriment of not  controlling glucose levels in relation to the foot. -Discussed supportive shoes at all times and checking feet regularly.  -Mechanically debrided all nails 1-5 bilateral using sterile nail nipper and filed with dremel without incident  -Answered all patient questions -Patient to return  in 3 months for at risk foot care -Patient advised to call the office if any problems or questions arise in the meantime.   Jennefer Moats, DPM

## 2024-03-16 ENCOUNTER — Other Ambulatory Visit: Payer: Self-pay

## 2024-03-16 DIAGNOSIS — I10 Essential (primary) hypertension: Secondary | ICD-10-CM

## 2024-03-16 MED ORDER — ROSUVASTATIN CALCIUM 10 MG PO TABS: 10.0000 mg | ORAL_TABLET | Freq: Every day | ORAL | 3 refills | Status: AC

## 2024-03-16 MED ORDER — FUROSEMIDE 40 MG PO TABS: ORAL_TABLET | ORAL | 3 refills | Status: AC

## 2024-03-17 ENCOUNTER — Other Ambulatory Visit: Payer: Self-pay

## 2024-04-07 NOTE — Progress Notes (Signed)
 The 10-year ASCVD risk score (Arnett DK, et al., 2019) is: 7.8%   Values used to calculate the score:     Age: 58 years     Sex: Female     Is Non-Hispanic African American: Yes     Diabetic: Yes     Tobacco smoker: No     Systolic Blood Pressure: 112 mmHg     Is BP treated: Yes     HDL Cholesterol: 54 mg/dL     Total Cholesterol: 138 mg/dL  Currently prescribed rosuvastatin  10 mg.  Saraih Lorton, BSN, RN

## 2024-04-25 ENCOUNTER — Other Ambulatory Visit: Payer: Self-pay

## 2024-04-25 ENCOUNTER — Ambulatory Visit

## 2024-05-01 ENCOUNTER — Ambulatory Visit: Payer: Medicare PPO | Admitting: Infectious Diseases

## 2024-05-09 ENCOUNTER — Other Ambulatory Visit: Payer: Self-pay | Admitting: Infectious Diseases

## 2024-05-09 DIAGNOSIS — B20 Human immunodeficiency virus [HIV] disease: Secondary | ICD-10-CM

## 2024-05-15 ENCOUNTER — Other Ambulatory Visit: Payer: Self-pay | Admitting: Nurse Practitioner

## 2024-05-15 DIAGNOSIS — I10 Essential (primary) hypertension: Secondary | ICD-10-CM

## 2024-05-17 ENCOUNTER — Encounter: Payer: Self-pay | Admitting: Nurse Practitioner

## 2024-05-17 ENCOUNTER — Ambulatory Visit (INDEPENDENT_AMBULATORY_CARE_PROVIDER_SITE_OTHER): Payer: Self-pay | Admitting: Nurse Practitioner

## 2024-05-17 VITALS — BP 128/90 | HR 67 | Temp 97.9°F | Wt 148.0 lb

## 2024-05-17 DIAGNOSIS — I10 Essential (primary) hypertension: Secondary | ICD-10-CM

## 2024-05-17 DIAGNOSIS — E119 Type 2 diabetes mellitus without complications: Secondary | ICD-10-CM | POA: Diagnosis not present

## 2024-05-17 LAB — POCT GLYCOSYLATED HEMOGLOBIN (HGB A1C): Hemoglobin A1C: 5.7 % — AB (ref 4.0–5.6)

## 2024-05-17 MED ORDER — METOPROLOL TARTRATE 25 MG PO TABS: 37.5000 mg | ORAL_TABLET | Freq: Two times a day (BID) | ORAL | 0 refills | Status: DC

## 2024-05-17 NOTE — Progress Notes (Signed)
 Subjective   Patient ID: Cathy Smith, female    DOB: 1966/05/06, 58 y.o.   MRN: [id number]  Chief Complaint  Patient presents with   Medical Management of Chronic Issues    Follow up    Referring provider: Oley Bascom RAMAN, NP  Cathy Smith is a 58 y.o. female with Past Medical History: No date: Coronary artery disease No date: History of condyloma acuminatum No date: History of uterine fibroid No date: History of vaginal dysplasia     Comment:  VAIN III--  oncologist-  dr eloy No date: History of vulvar dysplasia     Comment:  VIN III   gyn-oncologist-  dr eloy [facility]  dr hatcher: HIV infection  [facility]) No date: Hypertension No date: Hypertriglyceridemia without hypercholesterolemia No date: PONV (postoperative nausea and vomiting) 09/02/2022: Type 2 diabetes mellitus without complication, without  long-term current use of insulin  (HCC) No date: VIN III (vulvar intraepithelial neoplasia III) No date: Wears glasses   HPI  Hypertension: Patient here for follow-up of elevated blood pressure. She is not exercising and is adherent to low salt diet.  Blood pressure is well controlled at home. Cardiac symptoms none. Patient denies none.  Cardiovascular risk factors: hypertension, obesity (BMI >= 30 kg/m2), and sedentary lifestyle. Use of agents associated with hypertension: none. History of target organ damage: none. States that she regularly takes B/P at home and it is similar to today's values. Currently compliant with all medications.   Diabetes: Presents for follow up on diabetes. Currently on metformin  for diabetes - working on diabetic diet. Patient has been to nutritionist, but states that she is still eating unhealthy. Did discuss healthy food choices today. A1c in office today was 5.7. Denies f/c/s, n/v/d, hemoptysis, PND, leg swelling Denies chest pain or edema     Denies f/c/s, n/v/d, hemoptysis, PND, leg swelling Denies chest pain or  edema     No Known Allergies  Immunization History  Administered Date(s) Administered   H1N1 10/18/2008   Hepatitis B 01/13/2001, 02/02/2001, 03/02/2001, 11/16/2001   Hepatitis B, ADULT 02/05/2014, 08/08/2014, 02/11/2015   Influenza Split 09/02/2011, 12/20/2012   Influenza Whole 08/30/2006, 08/03/2007, 09/24/2008, 08/07/2009, 09/11/2010   Influenza, Seasonal, Injecte, Preservative Fre 08/12/2023   Influenza,inj,Quad PF,6+ Mos 08/02/2013, 08/08/2014, 08/27/2015, 08/19/2016, 09/20/2017, 08/15/2018, 08/16/2019, 08/12/2020, 08/04/2021, 08/26/2022   Meningococcal Mcv4o 04/21/2017   PFIZER(Purple Top)SARS-COV-2 Vaccination 01/25/2020, 02/19/2020, 08/30/2020   PNEUMOCOCCAL CONJUGATE-20 04/07/2022   Pfizer Covid-19 Vaccine Bivalent Booster 22yrs & up 08/04/2021   Pfizer(Comirnaty)Fall Seasonal Vaccine 12 years and older 08/26/2022, 08/31/2023   Pneumococcal Conjugate-13 01/13/2016   Pneumococcal Polysaccharide-23 08/30/2006, 12/31/2010   Tdap 09/06/2014    Tobacco History: Social History   Tobacco Use  Smoking Status Never  Smokeless Tobacco Never   Counseling given: Not Answered   Outpatient Encounter Medications as of 05/17/2024  Medication Sig   Blood Glucose Monitoring Suppl DEVI 1 each by Does not apply route in the morning, at noon, and at bedtime. May substitute to any manufacturer covered by patient's insurance.   darunavir  (PREZISTA ) 800 MG tablet TAKE 1 TABLET(800 MG) BY MOUTH DAILY WITH BREAKFAST   furosemide  (LASIX ) 40 MG tablet Take one tablet by mouth daily as needed for swelling   GENVOYA  150-150-200-10 MG TABS tablet TAKE 1 TABLET BY MOUTH DAILY WITH BREAKFAST   hydrOXYzine  (ATARAX /VISTARIL ) 10 MG tablet Take 1 tablet (10 mg total) by mouth 3 (three) times daily as needed.   meclizine  (ANTIVERT ) 25 MG tablet Take  1 tablet (25 mg total) by mouth 3 (three) times daily as needed for dizziness.   metFORMIN  (GLUCOPHAGE -XR) 500 MG 24 hr tablet TAKE 1 TABLET(500 MG) BY  MOUTH DAILY WITH BREAKFAST   rosuvastatin  (CRESTOR ) 10 MG tablet Take 1 tablet (10 mg total) by mouth daily.   [DISCONTINUED] metoprolol  tartrate (LOPRESSOR ) 25 MG tablet TAKE 1 AND 1/2 TABLETS(37.5 MG) BY MOUTH TWICE DAILY   metoprolol  tartrate (LOPRESSOR ) 25 MG tablet Take 1.5 tablets (37.5 mg total) by mouth 2 (two) times daily.   [DISCONTINUED] atorvastatin  (LIPITOR ) 20 MG tablet TAKE 1 TABLET(20 MG) BY MOUTH DAILY AT 6 PM   No facility-administered encounter medications on file as of 05/17/2024.    Review of Systems  Review of Systems  Constitutional: Negative.   HENT: Negative.    Cardiovascular: Negative.   Gastrointestinal: Negative.   Allergic/Immunologic: Negative.   Neurological: Negative.   Psychiatric/Behavioral: Negative.       Objective:   BP (!) 128/90   Pulse 67   Temp 97.9 F (36.6 C) (Oral)   Wt 148 lb (67.1 kg)   SpO2 100%   BMI 27.96 kg/m   Wt Readings from Last 5 Encounters:  05/17/24 148 lb (67.1 kg)  02/10/24 143 lb 6.4 oz (65 kg)  01/13/24 143 lb (64.9 kg)  12/20/23 143 lb 12.8 oz (65.2 kg)  08/18/23 136 lb (61.7 kg)     Physical Exam Vitals and nursing note reviewed.  Constitutional:      General: She is not in acute distress.    Appearance: She is well-developed.  Cardiovascular:     Rate and Rhythm: Normal rate and regular rhythm.  Pulmonary:     Effort: Pulmonary effort is normal.     Breath sounds: Normal breath sounds.  Neurological:     Mental Status: She is alert and oriented to person, place, and time.       Assessment & Plan:   Type 2 diabetes mellitus without complication, without long-term current use of insulin  (HCC) -     POCT glycosylated hemoglobin (Hb A1C)  Essential hypertension -     Metoprolol  Tartrate; Take 1.5 tablets (37.5 mg total) by mouth 2 (two) times daily.  Dispense: 180 tablet; Refill: 0     Return in about 3 months (around 08/17/2024).   Bascom GORMAN Borer, NP 05/17/2024

## 2024-05-17 NOTE — Patient Instructions (Signed)
 1. Type 2 diabetes mellitus without complication, without long-term current use of insulin  (HCC) (Primary)  - POCT glycosylated hemoglobin (Hb A1C)    Lab Results  Component Value Date   HGBA1C 5.7 (A) 05/17/2024   HGBA1C 5.6 02/10/2024   HGBA1C 5.5 08/12/2023   2. Essential hypertension  - metoprolol  tartrate (LOPRESSOR ) 25 MG tablet; Take 1.5 tablets (37.5 mg total) by mouth 2 (two) times daily.  Dispense: 180 tablet; Refill: 0  Follow up:  Follow up in 3 months

## 2024-05-18 DIAGNOSIS — E119 Type 2 diabetes mellitus without complications: Secondary | ICD-10-CM | POA: Diagnosis not present

## 2024-05-24 ENCOUNTER — Ambulatory Visit: Admitting: Infectious Diseases

## 2024-06-07 ENCOUNTER — Encounter: Payer: Self-pay | Admitting: Infectious Diseases

## 2024-06-07 ENCOUNTER — Ambulatory Visit: Admitting: Infectious Diseases

## 2024-06-07 ENCOUNTER — Other Ambulatory Visit: Payer: Self-pay

## 2024-06-07 ENCOUNTER — Encounter: Payer: Self-pay | Admitting: Obstetrics & Gynecology

## 2024-06-07 VITALS — BP 114/68 | HR 72 | Temp 97.7°F | Ht 61.0 in | Wt 142.0 lb

## 2024-06-07 DIAGNOSIS — I10 Essential (primary) hypertension: Secondary | ICD-10-CM

## 2024-06-07 DIAGNOSIS — B20 Human immunodeficiency virus [HIV] disease: Secondary | ICD-10-CM

## 2024-06-07 DIAGNOSIS — R87619 Unspecified abnormal cytological findings in specimens from cervix uteri: Secondary | ICD-10-CM | POA: Diagnosis not present

## 2024-06-07 MED ORDER — DARUNAVIR 800 MG PO TABS: 800.0000 mg | ORAL_TABLET | Freq: Every day | ORAL | 11 refills | Status: AC

## 2024-06-07 MED ORDER — GENVOYA 150-150-200-10 MG PO TABS
1.0000 | ORAL_TABLET | Freq: Every day | ORAL | 11 refills | Status: AC
Start: 2024-06-07 — End: ?

## 2024-06-07 NOTE — Patient Instructions (Addendum)
  Call Dr. Eloy / Dr. Georgina to schedule an appointment at [phone] to follow up on your abnormal pap smear results.   Would recommend to schedule an injection visit on October 16th for your COVID and flu shots   Fluid pill is the FUROSEMIDE  (Lasix ) - this is only to be taken if you notice extra fluid   METOPROLOL  - take 1 and 1/2 pills TWICE a day.   Another appointment in 11 months for regular care

## 2024-06-07 NOTE — Progress Notes (Signed)
 Name: Cathy Smith  DOB: 13-Oct-1966 MRN: [id number] PCP: Oley Bascom RAMAN, NP    Brief Narrative:  Cathy Smith  is a 58 y.o. female with well controlled HIV disease, Dx 2005 per chart review. CD4 nadir 60 VL 12,300  HIV Risk: HIV+ husband, heterosexual History of OIs: vulvar cancer  Intake Labs 2021: Hep B sAg (-), sAb (-), cAb (-); Hep A (not done), Hep C (not done) Quantiferon () HLA B*5701 (-) G6PD: ()   Previous Regimens: Genvoya  + Prezista    Genotypes: 2008 - P14S, V35I, S68G, R83K, V106I,  I178M, G196E, Q197K, T200I, L214F, E297K, D324E;  V3I, T4S, S37N, L63P  2009 - M184V  2010 - V106I   Cumulative Genotype:  RTI Resistance Mutations: M184V NNRTI Resistance Mutations: V106I  Nucleoside Reverse Transcriptase Inhibitors abacavir (ABC) Low-Level Resistance zidovudine (AZT) Susceptible emtricitabine  (FTC) High-Level Resistance lamivudine (3TC) High-Level Resistance tenofovir  (TDF) Susceptible  Non-nucleoside Reverse Transcriptase Inhibitors doravirine (DOR) Potential Low-Level Resistance efavirenz (EFV) Susceptible etravirine (ETR) Potential Low-Level Resistance nevirapine (NVP) Potential Low-Level Resistance rilpivirine (RPV) Potential Low-Level Resistance  PI Major Resistance Mutations: None PI Accessory Resistance Mutations: None  Protease Inhibitors atazanavir/r (ATV/r) Susceptible darunavir /r (DRV/r) Susceptible lopinavir/r (LPV/r) Susceptible   Subjective:   Chief Complaint  Patient presents with   Follow-up     Discussed the use of AI scribe software for clinical note transcription with the patient, who gave verbal consent to proceed.  History of Present Illness   Cathy Smith is a 58 year old female who presents for follow-up on her cervical health.  She is here for follow-up regarding her cervical health after an abnormal Pap smear in 2021. She has not had a follow-up appointment due to scheduling issues and is unsure if she needs  to continue seeing her previous provider or another doctor she saw in August of last year.  She has received her COVID vaccine in October and plans to receive her next dose along with her flu shot in September or October. She also mentions having received the shingles vaccine at [facility], although the exact date is uncertain, possibly February and April of last year.  Her current medication regimen includes metoprolol , which she takes as a pill and a half twice daily. She also has a prescription for furosemide  to be taken as needed for fluid retention. Her husband remarks she is not always consistent with Metoprolol  use.        Review of Systems  Constitutional:  Negative for chills and fever.  HENT:  Negative for tinnitus.   Eyes:  Negative for blurred vision and photophobia.  Respiratory:  Negative for cough and sputum production.   Cardiovascular:  Negative for chest pain.  Gastrointestinal:  Negative for diarrhea, nausea and vomiting.  Genitourinary:  Negative for dysuria.  Skin:  Negative for rash.  Neurological:  Negative for headaches.      Past Medical History:  Diagnosis Date   Coronary artery disease    History of condyloma acuminatum    History of uterine fibroid    History of vaginal dysplasia    VAIN III--  oncologist-  dr eloy   History of vulvar dysplasia    VIN III   gyn-oncologist-  dr eloy   HIV infection [facility]  dr hatcher   Hypertension    Hypertriglyceridemia without hypercholesterolemia    PONV (postoperative nausea and vomiting)    Type 2 diabetes mellitus without complication, without long-term current use of insulin  (  HCC) 09/02/2022   VIN III (vulvar intraepithelial neoplasia III)    Wears glasses     Outpatient Medications Prior to Visit  Medication Sig Dispense Refill   Blood Glucose Monitoring Suppl DEVI 1 each by Does not apply route in the morning, at noon, and at bedtime. May substitute to any  manufacturer covered by patient's insurance. 1 each 0   furosemide  (LASIX ) 40 MG tablet Take one tablet by mouth daily as needed for swelling 90 tablet 3   metFORMIN  (GLUCOPHAGE -XR) 500 MG 24 hr tablet TAKE 1 TABLET(500 MG) BY MOUTH DAILY WITH BREAKFAST 90 tablet 1   metoprolol  tartrate (LOPRESSOR ) 25 MG tablet Take 1.5 tablets (37.5 mg total) by mouth 2 (two) times daily. 180 tablet 0   rosuvastatin  (CRESTOR ) 10 MG tablet Take 1 tablet (10 mg total) by mouth daily. 90 tablet 3   darunavir  (PREZISTA ) 800 MG tablet TAKE 1 TABLET(800 MG) BY MOUTH DAILY WITH BREAKFAST 30 tablet 0   GENVOYA  150-150-200-10 MG TABS tablet TAKE 1 TABLET BY MOUTH DAILY WITH BREAKFAST 30 tablet 0   hydrOXYzine  (ATARAX /VISTARIL ) 10 MG tablet Take 1 tablet (10 mg total) by mouth 3 (three) times daily as needed. 90 tablet 1   meclizine  (ANTIVERT ) 25 MG tablet Take 1 tablet (25 mg total) by mouth 3 (three) times daily as needed for dizziness. 30 tablet 0   No facility-administered medications prior to visit.     No Known Allergies  Social History   Tobacco Use   Smoking status: Never   Smokeless tobacco: Never  Vaping Use   Vaping status: Never Used  Substance Use Topics   Alcohol use: No    Alcohol/week: 0.0 standard drinks of alcohol   Drug use: No    Social History   Substance and Sexual Activity  Sexual Activity Yes   Partners: Male   Birth control/protection: Condom   Comment: accepted condoms     Objective:   Vitals:   06/07/24 1058  BP: 114/68  Pulse: 72  Temp: 97.7 F (36.5 C)  TempSrc: Temporal  SpO2: 96%  Weight: 142 lb (64.4 kg)  Height: 5' 1 (1.549 m)   Body mass index is 26.83 kg/m.   Physical Exam Constitutional:      Appearance: Normal appearance. She is not ill-appearing.  HENT:     Mouth/Throat:     Mouth: Mucous membranes are moist.     Pharynx: Oropharynx is clear.  Eyes:     General: No scleral icterus. Pulmonary:     Effort: Pulmonary effort is normal.   Neurological:     Mental Status: She is oriented to person, place, and time.  Psychiatric:        Mood and Affect: Mood normal.        Thought Content: Thought content normal.     Lab Results Lab Results  Component Value Date   WBC 4.0 02/10/2024   HGB 12.4 02/10/2024   HCT 36.1 02/10/2024   MCV 97 02/10/2024   PLT 155 02/10/2024    Lab Results  Component Value Date   CREATININE 0.86 02/10/2024   BUN 10 02/10/2024   NA 141 02/10/2024   K 3.9 02/10/2024   CL 103 02/10/2024   CO2 23 02/10/2024    Lab Results  Component Value Date   ALT 19 02/10/2024   AST 19 02/10/2024   ALKPHOS 65 02/10/2024   BILITOT 0.5 02/10/2024    Lab Results  Component Value Date   CHOL 138 02/10/2024  HDL 54 02/10/2024   LDLCALC 67 02/10/2024   TRIG 87 02/10/2024   CHOLHDL 2.6 02/10/2024   HIV 1 RNA Quant  Date Value  05/06/2023 Not Detected Copies/mL  10/19/2022 Not Detected Copies/mL  04/07/2022 CANCELED   CD4 T Cell Abs (/uL)  Date Value  10/19/2022 489  04/07/2022 493  08/04/2021 378 (L)     Assessment & Plan:  Assessment and Plan    HIV infection - Well managed on Genvoya  + Prezista  taken together daily with food. There is a concern about medication adherence, which could impact her viral load and CD4 count. Pap smear abnormal and needs FU with gyn onc team.  - Check viral load and CD4 count today. - Encourage adherence to antiretroviral therapy. - Shingles vaccine completed - updated records.  - Flu/COVID shot scheduled in October  - FU in 26m   Hypertension - Hypertension is being managed with metoprolol . There is a concern about medication adherence, particularly with the metoprolol  regimen. - Instruct to take metoprolol  twice daily as prescribed. - Reinforced furosemide  is only PRN for swelling   Abnormal cervical pap smear - The last Pap smear in 2021 was abnormal, indicating cellular changes that could progress to cervical cancer if left unchecked. She  expressed concern about cancer due to family history and past experiences with friends who had cancer. - Provide contact information for Doctor Georgina and Doctor Eloy for follow-up. - Encourage scheduling a follow-up appointment with gynecology for repeat Pap smear.  Immunization needs assessment - There is a need to assess immunity to measles due to recent cases in [place] . COVID-19 and influenza vaccines are due in September or October. Shingles vaccination status needs to be confirmed and updated in the medical record. - Order measles titer to assess immunity. - Schedule COVID-19 and influenza vaccines for September or October. - Update medical record with shingles vaccination information once provided.  Visit duration: 28 minutes      Meds ordered this encounter  Medications   elvitegravir-cobicistat-emtricitabine -tenofovir  (GENVOYA ) 150-150-200-10 MG TABS tablet    Sig: Take 1 tablet by mouth daily with breakfast.    Dispense:  30 tablet    Refill:  11    Fill with Prezista     Prescription Type::   Renewal   darunavir  (PREZISTA ) 800 MG tablet    Sig: Take 1 tablet (800 mg total) by mouth daily with breakfast.    Dispense:  30 tablet    Refill:  11    Fill with Genvoya     Prescription Type::   Renewal   Orders Placed This Encounter  Procedures   HIV 1 RNA quant-no reflex-bld   Measles/Mumps/Rubella Immunity   T-helper cells (CD4) count     Corean Fireman, MSN, NP-C [facility] for Infectious Disease [facility] Pager: [phone] Office: [phone]  06/07/24  2:57 PM

## 2024-06-09 ENCOUNTER — Ambulatory Visit: Payer: Self-pay | Admitting: Infectious Diseases

## 2024-06-09 LAB — MEASLES/MUMPS/RUBELLA IMMUNITY
Mumps IgG: 112 [AU]/ml
Rubella: 19.7 {index}
Rubeola IgG: 84.1 [AU]/ml

## 2024-06-09 LAB — HIV-1 RNA QUANT-NO REFLEX-BLD
HIV 1 RNA Quant: NOT DETECTED {copies}/mL
HIV-1 RNA Quant, Log: NOT DETECTED {Log_copies}/mL

## 2024-06-09 LAB — T-HELPER CELLS (CD4) COUNT (NOT AT ARMC)
CD4 % Helper T Cell: 29 % — ABNORMAL LOW (ref 33–65)
CD4 T Cell Abs: 417 /uL (ref 400–1790)

## 2024-06-14 ENCOUNTER — Ambulatory Visit: Admitting: Podiatry

## 2024-06-21 ENCOUNTER — Inpatient Hospital Stay: Admitting: Obstetrics & Gynecology

## 2024-06-27 ENCOUNTER — Ambulatory Visit (INDEPENDENT_AMBULATORY_CARE_PROVIDER_SITE_OTHER): Admitting: Podiatry

## 2024-06-27 DIAGNOSIS — Z91199 Patient's noncompliance with other medical treatment and regimen due to unspecified reason: Secondary | ICD-10-CM

## 2024-06-27 NOTE — Progress Notes (Signed)
 Cancel 24 hours-transportation

## 2024-06-28 ENCOUNTER — Other Ambulatory Visit: Payer: Self-pay

## 2024-06-28 ENCOUNTER — Ambulatory Visit

## 2024-07-03 ENCOUNTER — Ambulatory Visit (INDEPENDENT_AMBULATORY_CARE_PROVIDER_SITE_OTHER): Admitting: Podiatry

## 2024-07-03 ENCOUNTER — Encounter: Payer: Self-pay | Admitting: Podiatry

## 2024-07-03 DIAGNOSIS — B351 Tinea unguium: Secondary | ICD-10-CM

## 2024-07-03 DIAGNOSIS — E119 Type 2 diabetes mellitus without complications: Secondary | ICD-10-CM

## 2024-07-03 DIAGNOSIS — M79675 Pain in left toe(s): Secondary | ICD-10-CM | POA: Diagnosis not present

## 2024-07-03 DIAGNOSIS — M79674 Pain in right toe(s): Secondary | ICD-10-CM | POA: Diagnosis not present

## 2024-07-03 NOTE — Progress Notes (Signed)
  Subjective:  Patient ID: Cathy Smith, female    DOB: [birth year],   MRN: [id number]  Chief Complaint  Patient presents with   Diabetes    I'm Diabetic.  Saw Bascom Borer, NP - 05/17/2024; A1c - 5.7    58 y.o. female presents for concern of thickened elongated and painful nails that are difficult to trim. Requesting to have them trimmed today. Relates burning and tingling in their feet. Patient is diabetic and last A1c was  Lab Results  Component Value Date   HGBA1C 5.7 (A) 05/17/2024   .   PCP:  Borer Bascom RAMAN, NP    . Denies any other pedal complaints. Denies n/v/f/c.   Past Medical History:  Diagnosis Date   Coronary artery disease    History of condyloma acuminatum    History of uterine fibroid    History of vaginal dysplasia    VAIN III--  oncologist-  dr eloy   History of vulvar dysplasia    VIN III   gyn-oncologist-  dr eloy   HIV infection [facility]) [facility]-  dr hatcher   Hypertension    Hypertriglyceridemia without hypercholesterolemia    PONV (postoperative nausea and vomiting)    Type 2 diabetes mellitus without complication, without long-term current use of insulin  (HCC) 09/02/2022   VIN III (vulvar intraepithelial neoplasia III)    Wears glasses     Objective:  Physical Exam: Vascular: DP/PT pulses 2/4 bilateral. CFT <3 seconds. Absent hair growth on digits. Edema noted to bilateral lower extremities. Xerosis noted bilaterally.  Skin. No lacerations or abrasions bilateral feet. Nails 1-5 bilateral  are thickened discolored and elongated with subungual debris.  Musculoskeletal: MMT 5/5 bilateral lower extremities in DF, PF, Inversion and Eversion. Deceased ROM in DF of ankle joint.  Neurological: Sensation intact to light touch. Protective sensation diminished bilateral.    Assessment:   1. Pain due to onychomycosis of toenails of both feet   2. Type 2 diabetes mellitus without complication, without long-term current use of  insulin  (HCC)       Plan:  Patient was evaluated and treated and all questions answered. -Discussed and educated patient on diabetic foot care, especially with  regards to the vascular, neurological and musculoskeletal systems.  -Stressed the importance of good glycemic control and the detriment of not  controlling glucose levels in relation to the foot. -Discussed supportive shoes at all times and checking feet regularly.  -Mechanically debrided all nails 1-5 bilateral using sterile nail nipper and filed with dremel without incident  -Answered all patient questions -Patient to return  in 3 months for at risk foot care -Patient advised to call the office if any problems or questions arise in the meantime.   Asberry Failing, DPM

## 2024-07-05 ENCOUNTER — Inpatient Hospital Stay: Attending: Obstetrics & Gynecology | Admitting: Obstetrics & Gynecology

## 2024-07-05 DIAGNOSIS — D071 Carcinoma in situ of vulva: Secondary | ICD-10-CM

## 2024-07-05 NOTE — Progress Notes (Unsigned)
 Follow Up Note: Gyn-Onc  Cathy Smith 58 y.o. female  CC: She presents for a f/u exam  HPI:  58 year old African American female seen in consultation requested of [name], and Dr. Starla regarding management of a newly diagnosed severe dysplasia of the upper vagina. Briefly, the patient's history includes having a past history of abdominal hysterectomy for what we understand and have been fibroids. She denies any problems with Pap smears prior to the hysterectomy. More recently she has had some abnormal Pap smears ultimately resulting in colposcopy and directed biopsy on 04/07/2012. 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   The patient did well until March 2015 with a Pap smear showing high-grade dysplasia once again. In April and May 2015 the patient received 2 additional months of Efudex. From June 2015 through February 2016 her Pap smears only showed low-grade SIL.   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 She was scheduled for a wide local excision of the right anterior vulva which was performed on 05/06/15 and revealed VIN3. Margins were positive. Intraoperative evaluation showed lesions consistent with VIN3 on bilateral posterior labia. These were biopsied (but not treated at the patient's request). These biopsies also returned as positive for VIN3.   On 07/23/15 she was taken to the OR for an extensive CO2 laser ablation of the circumferential labia minora and majora to ablate all acetowhite areas. No pathology was taken. The clitoris was spared.    Her VIN 3 recurred and on 02/25/16 she returned to the OR for CO2 laser which included the anterior vulva and clitoris.   Pap in March, 2018 showed LGSIL Pap in March 2019  showed LGSIL , high risk HPV present.    Her last Pap smear in March 2018 showed LGSIL + hrHPV.    She had open heart surgery in 2019. Since that time she has exhibited increased confusion and difficulty following instructions.    She underwent WLE right vulva and CO2 laser of anterior vulva on 05/04/17. She has no complaints. Biopsies on February, 2020 of the anterior and posterior vulva showed VIN 3.   She was taken to the OR on 12/27/18 for a wide local excision of the anterior and posterior vulva and CO2 laser of the periclitoral tissues. 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 All lesions showed VIN 3/AIN 3.   Pap 12/13/18 was stable with LGSIL and positive high risk HPV present.  Pap 5/21 showed ASCUS and positive hight risk HPV present. The histology from a vulva bx in 11/22 was benign. A Pap in 11/22 showed LSIL/HR-HPV pos A Pap in 1/23 showed NILM A Pap in 8/24 showed ASCUS/HR-HPV neg The histology from a left labial bx in 8/24 was benign.  Interval History: She denies any new lesions or itching.    Review of Systems  Review of Systems  Genitourinary:        Negative for itching     Current Meds:  Outpatient Encounter Medications as of 07/05/2024  Medication Sig   Blood Glucose Monitoring Suppl DEVI 1 each by Does not apply route in the morning, at noon, and at bedtime. May substitute to any manufacturer covered by patient's insurance.   darunavir  (PREZISTA ) 800 MG tablet Take 1 tablet (800 mg total) by mouth daily with  breakfast.   elvitegravir-cobicistat-emtricitabine -tenofovir  (GENVOYA ) 150-150-200-10 MG TABS tablet Take 1 tablet by mouth daily with breakfast.   furosemide  (LASIX ) 40 MG tablet Take one tablet by mouth daily as needed for swelling   metFORMIN  (GLUCOPHAGE -XR) 500 MG 24 hr tablet TAKE 1 TABLET(500 MG) BY MOUTH DAILY WITH BREAKFAST   metoprolol  tartrate (LOPRESSOR ) 25 MG tablet Take 1.5 tablets (37.5 mg total)  by mouth 2 (two) times daily.   rosuvastatin  (CRESTOR ) 10 MG tablet Take 1 tablet (10 mg total) by mouth daily.   [DISCONTINUED] atorvastatin  (LIPITOR ) 20 MG tablet TAKE 1 TABLET(20 MG) BY MOUTH DAILY AT 6 PM   No facility-administered encounter medications on file as of 07/05/2024.    Allergy: No Known Allergies  Social Hx:   Social History   Socioeconomic History   Marital status: Married    Spouse name: Not on file   Number of children: 0   Years of education: 12   Highest education level: Not on file  Occupational History   Not on file  Tobacco Use   Smoking status: Never   Smokeless tobacco: Never  Vaping Use   Vaping status: Never Used  Substance and Sexual Activity   Alcohol use: No    Alcohol/week: 0.0 standard drinks of alcohol   Drug use: No   Sexual activity: Yes    Partners: Male    Birth control/protection: Condom    Comment: accepted condoms  Other Topics Concern   Not on file  Social History Narrative   Right handed   [place] home   Lives with husband   Not employed   Social Drivers of Health   Financial Resource Strain: Low Risk  (01/13/2024)   Overall Financial Resource Strain (CARDIA)    Difficulty of Paying Living Expenses: Not hard at all  Food Insecurity: No Food Insecurity (01/13/2024)   Hunger Vital Sign    Worried About Running Out of Food in the Last Year: Never true    Ran Out of Food in the Last Year: Never true  Transportation Needs: No Transportation Needs (01/13/2024)   PRAPARE - Administrator, Civil Service (Medical): No    Lack of Transportation (Non-Medical): No  Physical Activity: Patient Declined (01/13/2024)   Exercise Vital Sign    Days of Exercise per Week: Patient declined    Minutes of Exercise per Session: Patient declined  Stress: No Stress Concern Present (01/13/2024)   [employer] of Occupational Health - Occupational Stress Questionnaire    Feeling of Stress : Not at all  Social Connections: Socially  Integrated (01/13/2024)   Social Connection and Isolation Panel    Frequency of Communication with Friends and Family: More than three times a week    Frequency of Social Gatherings with Friends and Family: More than three times a week    Attends Religious Services: More than 4 times per year    Active Member of [employer] or Organizations: Yes    Attends Banker Meetings: More than 4 times per year    Marital Status: Married  Catering manager Violence: Not At Risk (01/13/2024)   Humiliation, Afraid, Rape, and Kick questionnaire    Fear of Current or Ex-Partner: No    Emotionally Abused: No    Physically Abused: No    Sexually Abused: No    Past Surgical Hx:  Past Surgical History:  Procedure Laterality Date   ABDOMINAL HYSTERECTOMY  1995 approx   ANTERIOR CERVICAL DECOMP/DISCECTOMY FUSION  11-30-2009   C4 --  C6   CO2 LASER APPLICATION Bilateral 07/23/2015   Procedure: BILATERAL CO2 LASER ABLATION OF THE VULVA;  Surgeon: Maurilio Ship, MD;  Location: [facility] [place];  Service: Gynecology;  Laterality: Bilateral;   CO2 LASER APPLICATION N/A 02/25/2016   Procedure: CO2 LASER OF THE VULVAR;  Surgeon: Maurilio Ship, MD;  Location: [facility];  Service: Gynecology;  Laterality: N/A;   CO2 LASER APPLICATION N/A 05/04/2017   Procedure: CO2 LASER VAPORIZATION OF THE VULVA;  Surgeon: Ship Maurilio, MD;  Location: [facility] [place];  Service: Gynecology;  Laterality: N/A;   CO2 LASER APPLICATION N/A 12/27/2018   Procedure: CO2 LASER APPLICATION OF VULVA;  Surgeon: Ship Maurilio, MD;  Location: [facility];  Service: Gynecology;  Laterality: N/A;   COLONOSCOPY  01-28-2015   CORONARY ARTERY BYPASS GRAFT N/A 11/25/2017   Procedure: CORONARY ARTERY BYPASS GRAFTING (CABG), ON PUMP, TIMES THREE, USING LEFT INTERNAL MAMMARY ARTERY AND ENDOSCOPICALLY HARVESTED LEFT GREATER SAPHENOUS VEIN;  Surgeon: Dusty Sudie DEL, MD;  Location: [facility] OR;  Service: Open Heart  Surgery;  Laterality: N/A;   I & D LEFT BUTTOCK ABSCESS  04-28-2001   LEFT HEART CATH AND CORONARY ANGIOGRAPHY N/A 11/22/2017   Procedure: LEFT HEART CATH AND CORONARY ANGIOGRAPHY;  Surgeon: Court Dorn PARAS, MD;  Location: [facility];  Service: Cardiovascular;  Laterality: N/A;   TEE WITHOUT CARDIOVERSION N/A 11/25/2017   Procedure: TRANSESOPHAGEAL ECHOCARDIOGRAM (TEE);  Surgeon: Dusty Sudie DEL, MD;  Location: [facility] OR;  Service: Open Heart Surgery;  Laterality: N/A;   VULVECTOMY Right 05/06/2015   Procedure: WIDE LOCAL  EXCISION  OF RIGHT VULVA;  Surgeon: Maurilio Ship, MD;  Location: [facility] [place];  Service: Gynecology;  Laterality: Right;   VULVECTOMY N/A 05/04/2017   Procedure: WIDE EXCISION VULVECTOMY;  Surgeon: Ship Maurilio, MD;  Location: [facility];  Service: Gynecology;  Laterality: N/A;   VULVECTOMY N/A 12/27/2018   Procedure: WIDE EXCISION VULVECTOMY;  Surgeon: Ship Maurilio, MD;  Location: [facility];  Service: Gynecology;  Laterality: N/A;    Past Medical Hx:  Past Medical History:  Diagnosis Date   Coronary artery disease    History of condyloma acuminatum    History of uterine fibroid    History of vaginal dysplasia    VAIN III--  oncologist-  dr ship   History of vulvar dysplasia    VIN III   gyn-oncologist-  dr ship   HIV infection [facility]) montiored by [facility]-  dr hatcher   Hypertension    Hypertriglyceridemia without hypercholesterolemia    PONV (postoperative nausea and vomiting)    Type 2 diabetes mellitus without complication, without long-term current use of insulin  (HCC) 09/02/2022   VIN III (vulvar intraepithelial neoplasia III)    Wears glasses     Family Hx:  Family History  Problem Relation Age of Onset   Cataracts Mother    Hypertension Father    CAD Paternal Grandfather    Colon cancer Neg Hx     Vitals:  There were no vitals taken for this visit.   Physical Exam:  Physical  Exam HENT:     Head: Normocephalic and atraumatic.  Abdominal:     Palpations: There is no mass.     Tenderness: There is no abdominal tenderness.     Hernia: No hernia is present.  Genitourinary:    Vagina: Normal. No lesions.     Rectum: Normal.      Comments:  Hyperpigmented, irregular pigment, white, verrucous plaques Musculoskeletal:     Cervical back: Neck supple.     Right lower leg: No edema.     Left lower leg: No edema.  Lymphadenopathy:     Cervical:     Right cervical: No superficial cervical adenopathy.    Left cervical: No superficial cervical adenopathy.     Lower Body: No right inguinal adenopathy. No left inguinal adenopathy.       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  Immunosuppression w/HIV, Pap smears w/persistent low-grade SIL, hrHPV infection S/P CO2 laser of vulva (circumferential, clitoris) w/right WLDE in 2/20 for VIN 3 and AIN 3 Persistent vulva lesions w/benign histology   >Review the Pap testing of the vagina w/HPV-cotesting >Continue to follow VIN and AIN w/regular surveillance and bx of any concerning areas  >Return prn or in 6 mos     I personally spent 30 minutes face-to-face and non-face-to-face in the care of this patient, which includes all pre, intra, and post visit time on the date of service.    Olam Mill, MD 07/05/2024, 11:28 AM

## 2024-07-11 ENCOUNTER — Telehealth: Payer: Self-pay | Admitting: *Deleted

## 2024-07-11 NOTE — Telephone Encounter (Signed)
 Patient called the office to reschedule her missed appointment with Dr. Rogelio on August 27 th. Patient was given a new appointment for Wednesday, September 3 rd. At 1030 with Dr. Rogelio. Pt agreed to date and time and thanked the office.

## 2024-07-12 ENCOUNTER — Encounter: Payer: Self-pay | Admitting: Obstetrics & Gynecology

## 2024-07-12 ENCOUNTER — Other Ambulatory Visit (HOSPITAL_COMMUNITY)
Admission: RE | Admit: 2024-07-12 | Discharge: 2024-07-12 | Disposition: A | Discharge status: Home / Self Care | Source: Ambulatory Visit | Attending: Obstetrics & Gynecology | Admitting: Obstetrics & Gynecology

## 2024-07-12 ENCOUNTER — Inpatient Hospital Stay: Attending: Obstetrics & Gynecology | Admitting: Obstetrics & Gynecology

## 2024-07-12 VITALS — BP 127/67 | HR 65 | Temp 98.1°F | Resp 18 | Wt 146.0 lb

## 2024-07-12 DIAGNOSIS — Z1151 Encounter for screening for human papillomavirus (HPV): Secondary | ICD-10-CM | POA: Diagnosis not present

## 2024-07-12 DIAGNOSIS — Z79899 Other long term (current) drug therapy: Secondary | ICD-10-CM | POA: Insufficient documentation

## 2024-07-12 DIAGNOSIS — I10 Essential (primary) hypertension: Secondary | ICD-10-CM | POA: Diagnosis not present

## 2024-07-12 DIAGNOSIS — Z86002 Personal history of in-situ neoplasm of other and unspecified genital organs: Secondary | ICD-10-CM | POA: Insufficient documentation

## 2024-07-12 DIAGNOSIS — Z124 Encounter for screening for malignant neoplasm of cervix: Secondary | ICD-10-CM | POA: Insufficient documentation

## 2024-07-12 DIAGNOSIS — D072 Carcinoma in situ of vagina: Secondary | ICD-10-CM

## 2024-07-12 DIAGNOSIS — Z21 Asymptomatic human immunodeficiency virus [HIV] infection status: Secondary | ICD-10-CM | POA: Insufficient documentation

## 2024-07-12 DIAGNOSIS — R87810 Cervical high risk human papillomavirus (HPV) DNA test positive: Secondary | ICD-10-CM | POA: Diagnosis not present

## 2024-07-12 DIAGNOSIS — R87610 Atypical squamous cells of undetermined significance on cytologic smear of cervix (ASC-US): Secondary | ICD-10-CM | POA: Diagnosis not present

## 2024-07-12 DIAGNOSIS — Z01419 Encounter for gynecological examination (general) (routine) without abnormal findings: Secondary | ICD-10-CM | POA: Diagnosis present

## 2024-07-12 DIAGNOSIS — D071 Carcinoma in situ of vulva: Secondary | ICD-10-CM | POA: Diagnosis not present

## 2024-07-12 NOTE — Progress Notes (Signed)
 Follow Up Note: Gyn-Onc  Cathy Smith Ernst 58 y.o. female  CC: She presents for a f/u exam  HPI:  58 year old African American female seen in consultation requested of [name], and Dr. Starla regarding management of a newly diagnosed severe dysplasia of the upper vagina. Briefly, the patient's history includes having a past history of abdominal hysterectomy for what we understand and have been fibroids. She denies any problems with Pap smears prior to the hysterectomy. More recently she has had some abnormal Pap smears ultimately resulting in colposcopy and directed biopsy on 04/07/2012. 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   The patient did well until March 2015 with a Pap smear showing high-grade dysplasia once again. In April and May 2015 the patient received 2 additional months of Efudex. From June 2015 through February 2016 her Pap smears only showed low-grade SIL.   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 She was scheduled for a wide local excision of the right anterior vulva which was performed on 05/06/15 and revealed VIN3. Margins were positive. Intraoperative evaluation showed lesions consistent with VIN3 on bilateral posterior labia. These were biopsied (but not treated at the patient's request). These biopsies also returned as positive for VIN3.   On 07/23/15 she was taken to the OR for an extensive CO2 laser ablation of the circumferential labia minora and majora to ablate all acetowhite areas. No pathology was taken. The clitoris was spared.    Her VIN 3 recurred and on 02/25/16 she returned to the OR for CO2 laser which included the anterior vulva and clitoris.   Pap in March, 2018 showed LGSIL Pap in March 2019  showed LGSIL , high risk HPV present.    Her last Pap smear in March 2018 showed LGSIL + hrHPV.    She had open heart surgery in 2019. Since that time she has exhibited increased confusion and difficulty following instructions.    She underwent WLE right vulva and CO2 laser of anterior vulva on 05/04/17. She has no complaints. Biopsies on February, 2020 of the anterior and posterior vulva showed VIN 3.   She was taken to the OR on 12/27/18 for a wide local excision of the anterior and posterior vulva and CO2 laser of the periclitoral tissues. 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 All lesions showed VIN 3/AIN 3.   Pap 12/13/18 was stable with LGSIL and positive high risk HPV present.  Pap 5/21 showed ASCUS and positive hight risk HPV present. The histology from a vulva bx in 11/22 was benign. A Pap in 11/22 showed LSIL/HR-HPV pos A Pap in 1/23 showed NILM A Pap in 8/24 showed ASCUS/HR-HPV neg The histology from a left labial bx in 8/24 was benign.  Interval History: She denies any new lesions or itching.      Review of Systems  Review of Systems  Genitourinary:        Negative for itching     Current Meds:  Outpatient Encounter Medications as of 07/12/2024  Medication Sig   Blood Glucose Monitoring Suppl DEVI 1 each by Does not apply route in the morning, at noon, and at bedtime. May substitute to any manufacturer covered by patient's insurance.   darunavir  (PREZISTA ) 800 MG tablet Take 1 tablet (800 mg total) by mouth  daily with breakfast.   elvitegravir-cobicistat-emtricitabine -tenofovir  (GENVOYA ) 150-150-200-10 MG TABS tablet Take 1 tablet by mouth daily with breakfast.   furosemide  (LASIX ) 40 MG tablet Take one tablet by mouth daily as needed for swelling   metFORMIN  (GLUCOPHAGE -XR) 500 MG 24 hr tablet TAKE 1 TABLET(500 MG) BY MOUTH DAILY WITH BREAKFAST   metoprolol  tartrate (LOPRESSOR ) 25 MG tablet Take 1.5 tablets (37.5 mg  total) by mouth 2 (two) times daily.   rosuvastatin  (CRESTOR ) 10 MG tablet Take 1 tablet (10 mg total) by mouth daily.   [DISCONTINUED] atorvastatin  (LIPITOR ) 20 MG tablet TAKE 1 TABLET(20 MG) BY MOUTH DAILY AT 6 PM   No facility-administered encounter medications on file as of 07/12/2024.    Allergy: No Known Allergies  Social Hx:   Social History   Socioeconomic History   Marital status: Married    Spouse name: Not on file   Number of children: 0   Years of education: 12   Highest education level: Not on file  Occupational History   Not on file  Tobacco Use   Smoking status: Never   Smokeless tobacco: Never  Vaping Use   Vaping status: Never Used  Substance and Sexual Activity   Alcohol use: No    Alcohol/week: 0.0 standard drinks of alcohol   Drug use: No   Sexual activity: Yes    Partners: Male    Birth control/protection: Condom    Comment: accepted condoms  Other Topics Concern   Not on file  Social History Narrative   Right handed   [place] home   Lives with husband   Not employed   Social Drivers of Health   Financial Resource Strain: Low Risk  (01/13/2024)   Overall Financial Resource Strain (CARDIA)    Difficulty of Paying Living Expenses: Not hard at all  Food Insecurity: No Food Insecurity (01/13/2024)   Hunger Vital Sign    Worried About Running Out of Food in the Last Year: Never true    Ran Out of Food in the Last Year: Never true  Transportation Needs: No Transportation Needs (01/13/2024)   PRAPARE - Administrator, Civil Service (Medical): No    Lack of Transportation (Non-Medical): No  Physical Activity: Patient Declined (01/13/2024)   Exercise Vital Sign    Days of Exercise per Week: Patient declined    Minutes of Exercise per Session: Patient declined  Stress: No Stress Concern Present (01/13/2024)   [employer] of Occupational Health - Occupational Stress Questionnaire    Feeling of Stress : Not at all  Social Connections: Socially  Integrated (01/13/2024)   Social Connection and Isolation Panel    Frequency of Communication with Friends and Family: More than three times a week    Frequency of Social Gatherings with Friends and Family: More than three times a week    Attends Religious Services: More than 4 times per year    Active Member of [employer] or Organizations: Yes    Attends Banker Meetings: More than 4 times per year    Marital Status: Married  Catering manager Violence: Not At Risk (01/13/2024)   Humiliation, Afraid, Rape, and Kick questionnaire    Fear of Current or Ex-Partner: No    Emotionally Abused: No    Physically Abused: No    Sexually Abused: No    Past Surgical Hx:  Past Surgical History:  Procedure Laterality Date   ABDOMINAL HYSTERECTOMY  1995 approx   ANTERIOR CERVICAL DECOMP/DISCECTOMY FUSION  11-30-2009  C4 -- C6   CO2 LASER APPLICATION Bilateral 07/23/2015   Procedure: BILATERAL CO2 LASER ABLATION OF THE VULVA;  Surgeon: Maurilio Ship, MD;  Location: [facility] [place];  Service: Gynecology;  Laterality: Bilateral;   CO2 LASER APPLICATION N/A 02/25/2016   Procedure: CO2 LASER OF THE VULVAR;  Surgeon: Maurilio Ship, MD;  Location: [facility];  Service: Gynecology;  Laterality: N/A;   CO2 LASER APPLICATION N/A 05/04/2017   Procedure: CO2 LASER VAPORIZATION OF THE VULVA;  Surgeon: Ship Maurilio, MD;  Location: [facility] [place];  Service: Gynecology;  Laterality: N/A;   CO2 LASER APPLICATION N/A 12/27/2018   Procedure: CO2 LASER APPLICATION OF VULVA;  Surgeon: Ship Maurilio, MD;  Location: [facility];  Service: Gynecology;  Laterality: N/A;   COLONOSCOPY  01-28-2015   CORONARY ARTERY BYPASS GRAFT N/A 11/25/2017   Procedure: CORONARY ARTERY BYPASS GRAFTING (CABG), ON PUMP, TIMES THREE, USING LEFT INTERNAL MAMMARY ARTERY AND ENDOSCOPICALLY HARVESTED LEFT GREATER SAPHENOUS VEIN;  Surgeon: Dusty Sudie DEL, MD;  Location: [facility];  Service: Open Heart  Surgery;  Laterality: N/A;   I & D LEFT BUTTOCK ABSCESS  04-28-2001   LEFT HEART CATH AND CORONARY ANGIOGRAPHY N/A 11/22/2017   Procedure: LEFT HEART CATH AND CORONARY ANGIOGRAPHY;  Surgeon: Court Dorn PARAS, MD;  Location: [facility];  Service: Cardiovascular;  Laterality: N/A;   TEE WITHOUT CARDIOVERSION N/A 11/25/2017   Procedure: TRANSESOPHAGEAL ECHOCARDIOGRAM (TEE);  Surgeon: Dusty Sudie DEL, MD;  Location: [facility] OR;  Service: Open Heart Surgery;  Laterality: N/A;   VULVECTOMY Right 05/06/2015   Procedure: WIDE LOCAL  EXCISION  OF RIGHT VULVA;  Surgeon: Maurilio Ship, MD;  Location: [facility] [place];  Service: Gynecology;  Laterality: Right;   VULVECTOMY N/A 05/04/2017   Procedure: WIDE EXCISION VULVECTOMY;  Surgeon: Ship Maurilio, MD;  Location[facility];  Service: Gynecology;  Laterality: N/A;   VULVECTOMY N/A 12/27/2018   Procedure: WIDE EXCISION VULVECTOMY;  Surgeon: Ship Maurilio, MD;  Location[facility];  Service: Gynecology;  Laterality: N/A;    Past Medical Hx:  Past Medical History:  Diagnosis Date   Coronary artery disease    History of condyloma acuminatum    History of uterine fibroid    History of vaginal dysplasia    VAIN III--  oncologist-  dr ship   History of vulvar dysplasia    VIN III   gyn-oncologist-  dr ship   HIV infection [facility]  dr hatcher   Hypertension    Hypertriglyceridemia without hypercholesterolemia    PONV (postoperative nausea and vomiting)    Type 2 diabetes mellitus without complication, without long-term current use of insulin  (HCC) 09/02/2022   VIN III (vulvar intraepithelial neoplasia III)    Wears glasses     Family Hx:  Family History  Problem Relation Age of Onset   Cataracts Mother    Hypertension Father    CAD Paternal Grandfather    Colon cancer Neg Hx     Vitals:  BP 127/67 (BP Location: Left Arm, Patient Position: Sitting)   Pulse 65   Temp  98.1 F (36.7 C) (Oral)   Resp 18   Wt 146 lb (66.2 kg)   SpO2 100%   BMI 27.59 kg/m    Physical Exam:   HENT:     Head: Normocephalic and atraumatic.  Abdominal:     Palpations: There is no mass.     Tenderness:  There is no abdominal tenderness.     Hernia: No hernia is present.  Genitourinary:  Vulva: right vestibule @ 1 o'clock--raised, hyperpigmented plaque, 1 mm, discrete borders; white areas--vestibule @ 1 o'clock, posterior fourchette; hypopigmented patches, bilateral; no ACW changes after application of acetic acid      Vagina: Normal. No lesions.     Rectum: Normal.  Musculoskeletal:     Cervical back: Neck supple.     Right lower leg: No edema.     Left lower leg: No edema.  Lymphadenopathy:     Cervical:     Right cervical: No superficial cervical adenopathy.    Left cervical: No superficial cervical adenopathy.     Lower Body: No right inguinal adenopathy. No left inguinal adenopathy.     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  Immunosuppression w/HIV S/P CO2 laser of vulva (circumferential, clitoris) w/right WLDE in 2/20 for VIN 3 and AIN 3 Persistent vulva lesions w/benign histology   >Review the Pap testing of the vagina w/HPV-cotesting >Continue to follow VIN and AIN w/regular surveillance and bx of any concerning areas  >Return prn or in 1 yr     I personally spent 25 minutes face-to-face and non-face-to-face in the care of this patient, which includes all pre, intra, and post visit time on the date of service.    Olam Mill, MD 07/12/2024, 9:03 AM

## 2024-07-12 NOTE — Assessment & Plan Note (Signed)
 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  Immunosuppression w/HIV. S/P CO2 laser of vulva (circumferential, clitoris) w/right WLDE in 2/20 for VIN 3 and AIN 3 Persistent vulva lesions w/benign histology   >Continue to follow VIN and AIN w/regular surveillance and bx of any concerning areas  >Return prn or in 1 yr

## 2024-07-12 NOTE — Patient Instructions (Signed)
 Return as needed or in 1 year ?

## 2024-07-17 ENCOUNTER — Telehealth: Payer: Self-pay

## 2024-07-17 LAB — CYTOLOGY - PAP
Comment: NEGATIVE
Diagnosis: UNDETERMINED — AB
High risk HPV: POSITIVE — AB

## 2024-07-17 NOTE — Telephone Encounter (Signed)
 Pt is aware Dr.Jackson-Moore to review pap smear results and office will call with recommendations

## 2024-07-17 NOTE — Telephone Encounter (Signed)
 Cathy Smith was seen by Dr.Jackson-Moore on 9/3. She is wanting to know the results of her recent pap smear.   Pt aware I will send a message to provider and will call her back.

## 2024-07-18 ENCOUNTER — Telehealth: Payer: Self-pay

## 2024-07-18 NOTE — Telephone Encounter (Signed)
 Per Eleanor Epps NP, I reached out to Cathy Smith. She is aware of abnormal pap smear results and understands the need of an appointment for a colposcopy. Questions answered.   Colposcopy scheduled for 9/24 @ 10:00. Pt agreed to date/time

## 2024-07-18 NOTE — Telephone Encounter (Signed)
-----   Message from Eleanor JONETTA Epps sent at 07/18/2024  1:19 PM EDT ----- Talked with Dr. Rogelio. She said since it has been awhile since she has had colpo she is recommending this to take a closer look at the tissues. Please arrange this for Sept 24.

## 2024-08-01 NOTE — Progress Notes (Signed)
 Cathy Smith                                          MRN: [id number]   08/01/2024   The [facility]BCI Quality Team Specialist reviewed this patient medical record for the purposes of chart review for care gap closure. The following were reviewed: abstraction for care gap closure-controlling blood pressure.    VBCI Quality Team

## 2024-08-02 ENCOUNTER — Encounter: Payer: Self-pay | Admitting: Obstetrics & Gynecology

## 2024-08-02 ENCOUNTER — Ambulatory Visit

## 2024-08-02 ENCOUNTER — Inpatient Hospital Stay: Admitting: Obstetrics & Gynecology

## 2024-08-02 ENCOUNTER — Other Ambulatory Visit: Payer: Self-pay

## 2024-08-02 VITALS — BP 132/72 | HR 62 | Temp 98.0°F | Resp 18 | Wt 147.0 lb

## 2024-08-02 DIAGNOSIS — R87810 Cervical high risk human papillomavirus (HPV) DNA test positive: Secondary | ICD-10-CM | POA: Diagnosis not present

## 2024-08-02 DIAGNOSIS — R87610 Atypical squamous cells of undetermined significance on cytologic smear of cervix (ASC-US): Secondary | ICD-10-CM | POA: Diagnosis not present

## 2024-08-02 DIAGNOSIS — D071 Carcinoma in situ of vulva: Secondary | ICD-10-CM

## 2024-08-02 DIAGNOSIS — Z86002 Personal history of in-situ neoplasm of other and unspecified genital organs: Secondary | ICD-10-CM

## 2024-08-02 NOTE — Progress Notes (Signed)
 BP 132/72 (BP Location: Right Arm, Patient Position: Sitting)   Pulse 62   Temp 98 F (36.7 C) (Oral)   Resp 18   Wt 147 lb (66.7 kg)   SpO2 100%   BMI 27.78 kg/m    Colposcopy Procedure Note  Indications: Pap smear several wks ago showed: ASCUS with POSITIVE high risk HPV.   Procedure Details  The risks and benefits of the procedure and Written informed consent obtained.  A time-out was performed confirming the patient, procedure and allergy status  Speculum placed in vagina and the upper vagina was swabbed x 3 with acetic acid  solution.  Findings:  Vaginal inspection: mild, central ACW     Specimens: None  Impression: Benign  Complications: none.  Plan: - Continue routine surveillance following treatment of VIN III - Repeat Pap testing in 1 yr

## 2024-08-02 NOTE — Patient Instructions (Signed)
 Return as needed or in 1 year ?

## 2024-08-07 ENCOUNTER — Other Ambulatory Visit: Payer: Self-pay | Admitting: Nurse Practitioner

## 2024-08-07 DIAGNOSIS — E119 Type 2 diabetes mellitus without complications: Secondary | ICD-10-CM

## 2024-08-10 ENCOUNTER — Telehealth: Payer: Self-pay | Admitting: Nurse Practitioner

## 2024-08-10 NOTE — Telephone Encounter (Signed)
 Copied from CRM [id number]. Topic: General - Other >> Aug 10, 2024  4:09 PM DeAngela L wrote: Reason for CRM: patient would like to ask if it's ok for her to eat the sugar free chocolate covered peanuts  Pt num [phone] (M)

## 2024-08-11 NOTE — Telephone Encounter (Signed)
 Copied from CRM [id number]. Topic: General - Other >> Aug 10, 2024  4:09 PM DeAngela L wrote: Reason for CRM: patient would like to ask if it's ok for her to eat the sugar free chocolate covered peanuts  Pt num [phone] (M)

## 2024-08-14 NOTE — Telephone Encounter (Signed)
Lvm advising pt Cathy Smith 

## 2024-08-17 ENCOUNTER — Ambulatory Visit: Payer: Self-pay | Admitting: Nurse Practitioner

## 2024-08-30 ENCOUNTER — Ambulatory Visit: Payer: Self-pay | Admitting: Nurse Practitioner

## 2024-08-30 ENCOUNTER — Ambulatory Visit: Payer: Self-pay

## 2024-08-30 NOTE — Telephone Encounter (Signed)
 FYI Only or Action Required?: FYI only for provider.  Patient was last seen in primary care on 05/17/2024 by Oley Bascom RAMAN, NP.  Called Nurse Triage reporting Follow-up.   Triage Disposition: Home Care  Patient/caregiver understands and will follow disposition?: Yes      Copied from CRM #[id number]. Topic: Clinical - Red Word Triage >> Aug 30, 2024  1:29 PM Deaijah H wrote: Red Word that prompted transfer to Nurse Triage: Needs to be rescheduled from missed appointment / pain in leg       Reason for Disposition  Nursing judgment or information in reference  Answer Assessment - Initial Assessment Questions 1. REASON FOR CALL: What is your main concern right now?     Patient missed an appointment today and called to reschedule. Patient only wanted assistance rescheduling the appointment, which I have done. She denied any further concerns at this time.  Protocols used: No Guideline Available-A-AH

## 2024-09-15 DIAGNOSIS — I110 Hypertensive heart disease with heart failure: Secondary | ICD-10-CM | POA: Diagnosis not present

## 2024-09-15 DIAGNOSIS — E119 Type 2 diabetes mellitus without complications: Secondary | ICD-10-CM | POA: Diagnosis not present

## 2024-09-15 DIAGNOSIS — Z21 Asymptomatic human immunodeficiency virus [HIV] infection status: Secondary | ICD-10-CM | POA: Diagnosis not present

## 2024-09-15 DIAGNOSIS — Z7984 Long term (current) use of oral hypoglycemic drugs: Secondary | ICD-10-CM | POA: Diagnosis not present

## 2024-09-15 DIAGNOSIS — F4320 Adjustment disorder, unspecified: Secondary | ICD-10-CM | POA: Diagnosis not present

## 2024-09-15 DIAGNOSIS — E785 Hyperlipidemia, unspecified: Secondary | ICD-10-CM | POA: Diagnosis not present

## 2024-09-15 DIAGNOSIS — I509 Heart failure, unspecified: Secondary | ICD-10-CM | POA: Diagnosis not present

## 2024-09-15 DIAGNOSIS — Z833 Family history of diabetes mellitus: Secondary | ICD-10-CM | POA: Diagnosis not present

## 2024-09-15 DIAGNOSIS — Z79899 Other long term (current) drug therapy: Secondary | ICD-10-CM | POA: Diagnosis not present

## 2024-09-25 ENCOUNTER — Encounter: Payer: Self-pay | Admitting: Nurse Practitioner

## 2024-09-25 ENCOUNTER — Ambulatory Visit (INDEPENDENT_AMBULATORY_CARE_PROVIDER_SITE_OTHER): Payer: Self-pay | Admitting: Nurse Practitioner

## 2024-09-25 VITALS — BP 118/72 | HR 81 | Wt 149.0 lb

## 2024-09-25 DIAGNOSIS — E119 Type 2 diabetes mellitus without complications: Secondary | ICD-10-CM | POA: Diagnosis not present

## 2024-09-25 DIAGNOSIS — M5432 Sciatica, left side: Secondary | ICD-10-CM

## 2024-09-25 DIAGNOSIS — Z23 Encounter for immunization: Secondary | ICD-10-CM | POA: Diagnosis not present

## 2024-09-25 LAB — POCT GLYCOSYLATED HEMOGLOBIN (HGB A1C): Hemoglobin A1C: 5.8 % — AB (ref 4.0–5.6)

## 2024-09-25 MED ORDER — KETOROLAC TROMETHAMINE 30 MG/ML IJ SOLN
30.0000 mg | Freq: Once | INTRAMUSCULAR | Status: AC
  Administered 2024-09-25: 30 mg via INTRAMUSCULAR

## 2024-09-25 NOTE — Progress Notes (Signed)
 Subjective   Patient ID: Cathy Smith, female    DOB: Apr 12, 1966, 58 y.o.   MRN: [id number]  Chief Complaint  Patient presents with   Follow-up   Diabetes   Hypertension    Referring provider: Oley Bascom RAMAN, NP  Cathy Smith is a 58 y.o. female with Past Medical History: No date: Coronary artery disease No date: History of condyloma acuminatum No date: History of uterine fibroid No date: History of vaginal dysplasia     Comment:  VAIN III--  oncologist-  dr eloy No date: History of vulvar dysplasia     Comment:  VIN III   gyn-oncologist-  dr eloy [facility]  dr hatcher: HIV infection  [facility]) No date: Hypertension No date: Hypertriglyceridemia without hypercholesterolemia No date: PONV (postoperative nausea and vomiting) 09/02/2022: Type 2 diabetes mellitus without complication, without  long-term current use of insulin  (HCC) No date: VIN III (vulvar intraepithelial neoplasia III) No date: Wears glasses   HPI   Hypertension: Patient here for follow-up of elevated blood pressure. She is not exercising and is adherent to low salt diet.  Blood pressure is well controlled at home. Cardiac symptoms none. Patient denies none.  Cardiovascular risk factors: hypertension, obesity (BMI >= 30 kg/m2), and sedentary lifestyle. Use of agents associated with hypertension: none. History of target organ damage: none. States that she regularly takes B/P at home and it is similar to today's values. Currently compliant with all medications.   Diabetes: Presents for follow up on diabetes. Currently on metformin  for diabetes - working on diabetic diet. Patient has been to nutritionist, but states that she is still eating unhealthy. Did discuss healthy food choices today. A1c in office today was 5.8. Denies f/c/s, n/v/d, hemoptysis, PND, leg swelling Denies chest pain or edema.  Note: Patient complains of left-sided sciatica.  We will trial Toradol  in office  today.     Denies f/c/s, n/v/d, hemoptysis, PND, leg swelling Denies chest pain or edema.      No Known Allergies  Immunization History  Administered Date(s) Administered   H1N1 10/18/2008   Hepatitis B 01/13/2001, 02/02/2001, 03/02/2001, 11/16/2001   Hepatitis B, ADULT 02/05/2014, 08/08/2014, 02/11/2015   Influenza Split 09/02/2011, 12/20/2012   Influenza Whole 08/30/2006, 08/03/2007, 09/24/2008, 08/07/2009, 09/11/2010   Influenza, Seasonal, Injecte, Preservative Fre 08/12/2023   Influenza,inj,Quad PF,6+ Mos 08/02/2013, 08/08/2014, 08/27/2015, 08/19/2016, 09/20/2017, 08/15/2018, 08/16/2019, 08/12/2020, 08/04/2021, 08/26/2022   Meningococcal Mcv4o 04/21/2017   PFIZER(Purple Top)SARS-COV-2 Vaccination 01/25/2020, 02/19/2020, 08/30/2020   PNEUMOCOCCAL CONJUGATE-20 04/07/2022   Pfizer Covid-19 Vaccine Bivalent Booster 107yrs & up 08/04/2021   Pfizer(Comirnaty)Fall Seasonal Vaccine 12 years and older 08/26/2022, 08/31/2023   Pneumococcal Conjugate-13 01/13/2016   Pneumococcal Polysaccharide-23 08/30/2006, 12/31/2010   Tdap 09/06/2014   Zoster Recombinant(Shingrix) 12/13/2022, 02/11/2023    Tobacco History: Social History   Tobacco Use  Smoking Status Never  Smokeless Tobacco Never   Counseling given: Not Answered   Outpatient Encounter Medications as of 09/25/2024  Medication Sig   Blood Glucose Monitoring Suppl DEVI 1 each by Does not apply route in the morning, at noon, and at bedtime. May substitute to any manufacturer covered by patient's insurance.   darunavir  (PREZISTA ) 800 MG tablet Take 1 tablet (800 mg total) by mouth daily with breakfast.   elvitegravir-cobicistat-emtricitabine -tenofovir  (GENVOYA ) 150-150-200-10 MG TABS tablet Take 1 tablet by mouth daily with breakfast.   furosemide  (LASIX ) 40 MG tablet Take one tablet by mouth daily as needed for swelling   metFORMIN  (GLUCOPHAGE -XR) 500 MG  24 hr tablet TAKE 1 TABLET(500 MG) BY MOUTH DAILY WITH BREAKFAST    metoprolol  tartrate (LOPRESSOR ) 25 MG tablet Take 1.5 tablets (37.5 mg total) by mouth 2 (two) times daily.   rosuvastatin  (CRESTOR ) 10 MG tablet Take 1 tablet (10 mg total) by mouth daily.   [DISCONTINUED] atorvastatin  (LIPITOR ) 20 MG tablet TAKE 1 TABLET(20 MG) BY MOUTH DAILY AT 6 PM   Facility-Administered Encounter Medications as of 09/25/2024  Medication   ketorolac  (TORADOL ) 30 MG/ML injection 30 mg    Review of Systems  Review of Systems  Constitutional: Negative.   HENT: Negative.    Cardiovascular: Negative.   Gastrointestinal: Negative.   Allergic/Immunologic: Negative.   Neurological: Negative.   Psychiatric/Behavioral: Negative.       Objective:   BP 118/72 (BP Location: Right Wrist, Patient Position: Sitting, Cuff Size: Normal)   Pulse 81   Wt 149 lb (67.6 kg)   SpO2 99%   BMI 28.15 kg/m   Wt Readings from Last 5 Encounters:  09/25/24 149 lb (67.6 kg)  08/02/24 147 lb (66.7 kg)  07/12/24 146 lb (66.2 kg)  06/07/24 142 lb (64.4 kg)  05/17/24 148 lb (67.1 kg)     Physical Exam Vitals and nursing note reviewed.  Constitutional:      General: She is not in acute distress.    Appearance: She is well-developed.  Cardiovascular:     Rate and Rhythm: Normal rate and regular rhythm.  Pulmonary:     Effort: Pulmonary effort is normal.     Breath sounds: Normal breath sounds.  Musculoskeletal:     Left lower leg: Tenderness present.       Legs:  Neurological:     Mental Status: She is alert and oriented to person, place, and time.       Assessment & Plan:   Type 2 diabetes mellitus without complication, without long-term current use of insulin  (HCC) -     POCT glycosylated hemoglobin (Hb A1C) -     CBC  Sciatica of left side -     Ketorolac  Tromethamine      Return in about 3 months (around 12/26/2024).    Bascom GORMAN Borer, NP 09/25/2024

## 2024-09-25 NOTE — Addendum Note (Signed)
 Addended by: Shaneil Yazdi R on: 09/25/2024 04:18 PM   Modules accepted: Orders

## 2024-09-26 LAB — CBC
Hematocrit: 38.3 % (ref 34.0–46.6)
Hemoglobin: 12.9 g/dL (ref 11.1–15.9)
MCH: 33.2 pg — ABNORMAL HIGH (ref 26.6–33.0)
MCHC: 33.7 g/dL (ref 31.5–35.7)
MCV: 99 fL — ABNORMAL HIGH (ref 79–97)
Platelets: 166 x10E3/uL (ref 150–450)
RBC: 3.89 x10E6/uL (ref 3.77–5.28)
RDW: 11.4 % — ABNORMAL LOW (ref 11.7–15.4)
WBC: 3.8 x10E3/uL (ref 3.4–10.8)

## 2024-09-27 ENCOUNTER — Ambulatory Visit: Payer: Self-pay | Admitting: Nurse Practitioner

## 2024-10-03 ENCOUNTER — Ambulatory Visit (INDEPENDENT_AMBULATORY_CARE_PROVIDER_SITE_OTHER): Payer: Self-pay | Admitting: Podiatry

## 2024-10-03 DIAGNOSIS — Z91199 Patient's noncompliance with other medical treatment and regimen due to unspecified reason: Secondary | ICD-10-CM

## 2024-10-03 NOTE — Progress Notes (Signed)
 No show

## 2024-10-09 ENCOUNTER — Ambulatory Visit: Payer: Self-pay | Admitting: Podiatry

## 2024-10-09 ENCOUNTER — Telehealth: Payer: Self-pay | Admitting: Podiatry

## 2024-10-09 NOTE — Telephone Encounter (Signed)
 LVM for pt to reschedule appt as provider is out of office.

## 2024-10-12 ENCOUNTER — Other Ambulatory Visit: Payer: Self-pay | Admitting: Nurse Practitioner

## 2024-10-12 DIAGNOSIS — I10 Essential (primary) hypertension: Secondary | ICD-10-CM

## 2024-10-16 ENCOUNTER — Ambulatory Visit: Admitting: Podiatry

## 2024-10-16 DIAGNOSIS — M79674 Pain in right toe(s): Secondary | ICD-10-CM | POA: Diagnosis not present

## 2024-10-16 DIAGNOSIS — E119 Type 2 diabetes mellitus without complications: Secondary | ICD-10-CM

## 2024-10-16 DIAGNOSIS — M79675 Pain in left toe(s): Secondary | ICD-10-CM | POA: Diagnosis not present

## 2024-10-16 DIAGNOSIS — B351 Tinea unguium: Secondary | ICD-10-CM | POA: Diagnosis not present

## 2024-10-16 NOTE — Progress Notes (Signed)
  Subjective:  Patient ID: Cathy Smith, female    DOB: 02-11-1966,   MRN: [id number]  Chief Complaint  Patient presents with   [facility]    DFC  A1c 5.8.  no anti coag.     58 y.o. female presents for concern of thickened elongated and painful nails that are difficult to trim. Requesting to have them trimmed today. Relates burning and tingling in their feet. Patient is diabetic and last A1c was  Lab Results  Component Value Date   HGBA1C 5.8 (A) 09/25/2024   .   PCP:  Oley Bascom RAMAN, NP    . Denies any other pedal complaints. Denies n/v/f/c.   Past Medical History:  Diagnosis Date   Coronary artery disease    History of condyloma acuminatum    History of uterine fibroid    History of vaginal dysplasia    VAIN III--  oncologist-  dr eloy   History of vulvar dysplasia    VIN III   gyn-oncologist-  dr eloy   HIV infection [facility]) [facility]-  dr hatcher   Hypertension    Hypertriglyceridemia without hypercholesterolemia    PONV (postoperative nausea and vomiting)    Type 2 diabetes mellitus without complication, without long-term current use of insulin  (HCC) 09/02/2022   VIN III (vulvar intraepithelial neoplasia III)    Wears glasses     Objective:  Physical Exam: Vascular: DP/PT pulses 2/4 bilateral. CFT <3 seconds. Absent hair growth on digits. Edema noted to bilateral lower extremities. Xerosis noted bilaterally.  Skin. No lacerations or abrasions bilateral feet. Nails 1-5 bilateral  are thickened discolored and elongated with subungual debris.  Musculoskeletal: MMT 5/5 bilateral lower extremities in DF, PF, Inversion and Eversion. Deceased ROM in DF of ankle joint.  Neurological: Sensation intact to light touch. Protective sensation diminished bilateral.    Assessment:   1. Pain due to onychomycosis of toenails of both feet   2. Type 2 diabetes mellitus without complication, without long-term current use of insulin  (HCC)       Plan:   Patient was evaluated and treated and all questions answered. -Discussed and educated patient on diabetic foot care, especially with  regards to the vascular, neurological and musculoskeletal systems.  -Stressed the importance of good glycemic control and the detriment of not  controlling glucose levels in relation to the foot. -Discussed supportive shoes at all times and checking feet regularly.  -Mechanically debrided all nails 1-5 bilateral using sterile nail nipper and filed with dremel without incident  -Answered all patient questions -Patient to return  in 3 months for at risk foot care -Patient advised to call the office if any problems or questions arise in the meantime.   Asberry Failing, DPM

## 2024-10-18 ENCOUNTER — Telehealth: Payer: Self-pay | Admitting: Cardiovascular Disease

## 2024-10-18 NOTE — Telephone Encounter (Signed)
 Patient had bypass surgery in 2019 and wants to know if she can still eat processed meats in moderation.

## 2024-10-18 NOTE — Telephone Encounter (Signed)
 Spoke to patient advised she should not eat processed foods.

## 2024-11-22 ENCOUNTER — Other Ambulatory Visit: Payer: Self-pay | Admitting: Nurse Practitioner

## 2024-11-22 DIAGNOSIS — Z1231 Encounter for screening mammogram for malignant neoplasm of breast: Secondary | ICD-10-CM

## 2024-11-23 ENCOUNTER — Telehealth: Payer: Self-pay

## 2024-11-23 NOTE — Telephone Encounter (Signed)
 Copied from CRM #[id number]. Topic: Clinical - Medical Advice >> Nov 23, 2024  9:34 AM Rea BROCKS wrote: Reason for CRM: Patient wants to ask as far as diabetes, Patient wants to know if she can eat vienna sausages in moderation? Patient would also like to know if it's okay for her to eat cherries, too?    [phone] (M)

## 2024-11-23 NOTE — Telephone Encounter (Signed)
 Pt was called and advised to go on the diabetes associations website  to obtain a list of foods that will be good for her.  KH

## 2024-11-29 ENCOUNTER — Telehealth: Payer: Self-pay

## 2024-11-29 NOTE — Telephone Encounter (Signed)
 Copied from CRM #[id number]. Topic: Clinical - Medical Advice >> Nov 29, 2024 12:19 PM Larissa RAMAN wrote: Reason for CRM: Patient is requesting a callback from nurse. She wants to know when was her last colonoscopy and if she can eat meatloaf?  Call returned back to patient and all questions were answered.

## 2024-11-29 NOTE — Telephone Encounter (Signed)
 Copied from CRM #[id number]. Topic: Clinical - Medical Advice >> Nov 29, 2024 12:19 PM Larissa RAMAN wrote: Reason for CRM: Patient is requesting a callback from nurse. She wants to know when was her last colonoscopy and if she can eat meatloaf?  Pt was advise . KH

## 2024-12-20 ENCOUNTER — Ambulatory Visit: Admitting: Cardiovascular Disease

## 2024-12-27 ENCOUNTER — Ambulatory Visit: Payer: Self-pay | Admitting: Nurse Practitioner

## 2025-01-02 ENCOUNTER — Ambulatory Visit

## 2025-01-15 ENCOUNTER — Ambulatory Visit: Admitting: Podiatry

## 2025-01-16 ENCOUNTER — Ambulatory Visit

## 2025-01-18 ENCOUNTER — Ambulatory Visit: Payer: Self-pay

## 2025-04-26 ENCOUNTER — Other Ambulatory Visit

## 2025-05-10 ENCOUNTER — Ambulatory Visit

## 2025-05-10 ENCOUNTER — Encounter: Payer: Self-pay | Admitting: Infectious Diseases
